# Patient Record
Sex: Female | Born: 1950
Health system: Southern US, Community
[De-identification: ages and names within clinical notes are randomized; demographics above are authoritative.]

## PROBLEM LIST (undated history)

## (undated) DIAGNOSIS — R112 Nausea with vomiting, unspecified: Secondary | ICD-10-CM

## (undated) DIAGNOSIS — E88819 Insulin resistance, unspecified: Secondary | ICD-10-CM

## (undated) DIAGNOSIS — M2550 Pain in unspecified joint: Secondary | ICD-10-CM

## (undated) DIAGNOSIS — Z9889 Other specified postprocedural states: Secondary | ICD-10-CM

## (undated) DIAGNOSIS — C4490 Unspecified malignant neoplasm of skin, unspecified: Secondary | ICD-10-CM

## (undated) DIAGNOSIS — E559 Vitamin D deficiency, unspecified: Secondary | ICD-10-CM

## (undated) DIAGNOSIS — E785 Hyperlipidemia, unspecified: Secondary | ICD-10-CM

## (undated) DIAGNOSIS — I10 Essential (primary) hypertension: Secondary | ICD-10-CM

## (undated) DIAGNOSIS — M545 Low back pain, unspecified: Secondary | ICD-10-CM

## (undated) DIAGNOSIS — S82899A Other fracture of unspecified lower leg, initial encounter for closed fracture: Secondary | ICD-10-CM

## (undated) DIAGNOSIS — M1990 Unspecified osteoarthritis, unspecified site: Secondary | ICD-10-CM

## (undated) DIAGNOSIS — R0602 Shortness of breath: Secondary | ICD-10-CM

## (undated) DIAGNOSIS — R600 Localized edema: Secondary | ICD-10-CM

## (undated) DIAGNOSIS — E669 Obesity, unspecified: Secondary | ICD-10-CM

## (undated) DIAGNOSIS — J189 Pneumonia, unspecified organism: Secondary | ICD-10-CM

## (undated) DIAGNOSIS — E538 Deficiency of other specified B group vitamins: Secondary | ICD-10-CM

## (undated) DIAGNOSIS — R002 Palpitations: Secondary | ICD-10-CM

## (undated) DIAGNOSIS — B019 Varicella without complication: Secondary | ICD-10-CM

## (undated) DIAGNOSIS — J45909 Unspecified asthma, uncomplicated: Secondary | ICD-10-CM

## (undated) DIAGNOSIS — D649 Anemia, unspecified: Secondary | ICD-10-CM

## (undated) DIAGNOSIS — E8881 Metabolic syndrome: Secondary | ICD-10-CM

## (undated) DIAGNOSIS — M79604 Pain in right leg: Secondary | ICD-10-CM

## (undated) DIAGNOSIS — G2581 Restless legs syndrome: Secondary | ICD-10-CM

## (undated) DIAGNOSIS — E039 Hypothyroidism, unspecified: Secondary | ICD-10-CM

## (undated) DIAGNOSIS — G4700 Insomnia, unspecified: Secondary | ICD-10-CM

## (undated) DIAGNOSIS — R9439 Abnormal result of other cardiovascular function study: Secondary | ICD-10-CM

## (undated) DIAGNOSIS — M4800 Spinal stenosis, site unspecified: Secondary | ICD-10-CM

## (undated) DIAGNOSIS — K219 Gastro-esophageal reflux disease without esophagitis: Secondary | ICD-10-CM

## (undated) DIAGNOSIS — M069 Rheumatoid arthritis, unspecified: Secondary | ICD-10-CM

## (undated) DIAGNOSIS — I4891 Unspecified atrial fibrillation: Secondary | ICD-10-CM

## (undated) DIAGNOSIS — R040 Epistaxis: Secondary | ICD-10-CM

## (undated) DIAGNOSIS — G4730 Sleep apnea, unspecified: Secondary | ICD-10-CM

## (undated) DIAGNOSIS — M549 Dorsalgia, unspecified: Secondary | ICD-10-CM

## (undated) HISTORY — PX: INGUINAL HERNIA REPAIR: SUR1180

## (undated) HISTORY — DX: Varicella without complication: B01.9

## (undated) HISTORY — DX: Palpitations: R00.2

## (undated) HISTORY — DX: Low back pain: M54.5

## (undated) HISTORY — DX: Shortness of breath: R06.02

## (undated) HISTORY — DX: Deficiency of other specified B group vitamins: E53.8

## (undated) HISTORY — DX: Pain in unspecified joint: M25.50

## (undated) HISTORY — DX: Unspecified osteoarthritis, unspecified site: M19.90

## (undated) HISTORY — DX: Unspecified malignant neoplasm of skin, unspecified: C44.90

## (undated) HISTORY — DX: Epistaxis: R04.0

## (undated) HISTORY — DX: Rheumatoid arthritis, unspecified: M06.9

## (undated) HISTORY — DX: Sleep apnea, unspecified: G47.30

## (undated) HISTORY — DX: Low back pain, unspecified: M54.50

## (undated) HISTORY — DX: Restless legs syndrome: G25.81

## (undated) HISTORY — PX: TONSILLECTOMY: SUR1361

## (undated) HISTORY — DX: Spinal stenosis, site unspecified: M48.00

## (undated) HISTORY — DX: Localized edema: R60.0

## (undated) HISTORY — DX: Vitamin D deficiency, unspecified: E55.9

## (undated) HISTORY — PX: CATARACT EXTRACTION: SUR2

## (undated) HISTORY — DX: Unspecified atrial fibrillation: I48.91

## (undated) HISTORY — DX: Insomnia, unspecified: G47.00

## (undated) HISTORY — DX: Dorsalgia, unspecified: M54.9

## (undated) HISTORY — DX: Unspecified asthma, uncomplicated: J45.909

## (undated) HISTORY — DX: Pain in right leg: M79.604

## (undated) HISTORY — DX: Anemia, unspecified: D64.9

## (undated) HISTORY — DX: Essential (primary) hypertension: I10

## (undated) HISTORY — PX: THYROIDECTOMY: SHX17

---

## 1999-09-28 ENCOUNTER — Ambulatory Visit (HOSPITAL_COMMUNITY): Admission: RE | Admit: 1999-09-28 | Discharge: 1999-09-28 | Payer: Self-pay | Admitting: Internal Medicine

## 1999-09-28 ENCOUNTER — Encounter: Payer: Self-pay | Admitting: Internal Medicine

## 1999-10-18 ENCOUNTER — Other Ambulatory Visit: Admission: RE | Admit: 1999-10-18 | Discharge: 1999-10-18 | Payer: Self-pay | Admitting: Internal Medicine

## 2000-11-29 ENCOUNTER — Other Ambulatory Visit: Admission: RE | Admit: 2000-11-29 | Discharge: 2000-11-29 | Payer: Self-pay | Admitting: Internal Medicine

## 2002-10-17 ENCOUNTER — Other Ambulatory Visit: Admission: RE | Admit: 2002-10-17 | Discharge: 2002-10-17 | Payer: Self-pay | Admitting: Internal Medicine

## 2003-06-26 ENCOUNTER — Ambulatory Visit (HOSPITAL_COMMUNITY): Admission: RE | Admit: 2003-06-26 | Discharge: 2003-06-26 | Payer: Self-pay | Admitting: Internal Medicine

## 2004-01-15 ENCOUNTER — Encounter: Admission: RE | Admit: 2004-01-15 | Discharge: 2004-01-15 | Payer: Self-pay | Admitting: Orthopedic Surgery

## 2004-04-01 ENCOUNTER — Other Ambulatory Visit: Admission: RE | Admit: 2004-04-01 | Discharge: 2004-04-01 | Payer: Self-pay | Admitting: Internal Medicine

## 2004-07-24 HISTORY — PX: TOTAL HIP ARTHROPLASTY: SHX124

## 2005-07-24 LAB — HM COLONOSCOPY

## 2009-07-24 LAB — HM PAP SMEAR

## 2011-05-29 ENCOUNTER — Other Ambulatory Visit (HOSPITAL_COMMUNITY): Payer: Self-pay | Admitting: *Deleted

## 2011-05-29 DIAGNOSIS — Z1231 Encounter for screening mammogram for malignant neoplasm of breast: Secondary | ICD-10-CM

## 2011-06-23 ENCOUNTER — Ambulatory Visit (HOSPITAL_COMMUNITY)
Admission: RE | Admit: 2011-06-23 | Discharge: 2011-06-23 | Disposition: A | Discharge status: Home / Self Care | Payer: PRIVATE HEALTH INSURANCE | Source: Ambulatory Visit | Attending: Family Medicine | Admitting: Family Medicine

## 2011-06-23 DIAGNOSIS — Z1231 Encounter for screening mammogram for malignant neoplasm of breast: Secondary | ICD-10-CM | POA: Insufficient documentation

## 2011-07-16 ENCOUNTER — Inpatient Hospital Stay (HOSPITAL_COMMUNITY)
Admission: EM | Admit: 2011-07-16 | Discharge: 2011-07-21 | DRG: 193 | Disposition: A | Discharge status: Home / Self Care | Payer: PRIVATE HEALTH INSURANCE | Attending: Internal Medicine | Admitting: Internal Medicine

## 2011-07-16 ENCOUNTER — Encounter: Payer: Self-pay | Admitting: *Deleted

## 2011-07-16 ENCOUNTER — Emergency Department (HOSPITAL_COMMUNITY): Payer: PRIVATE HEALTH INSURANCE

## 2011-07-16 ENCOUNTER — Other Ambulatory Visit: Payer: Self-pay

## 2011-07-16 DIAGNOSIS — R05 Cough: Secondary | ICD-10-CM | POA: Diagnosis present

## 2011-07-16 DIAGNOSIS — R0600 Dyspnea, unspecified: Secondary | ICD-10-CM

## 2011-07-16 DIAGNOSIS — E785 Hyperlipidemia, unspecified: Secondary | ICD-10-CM

## 2011-07-16 DIAGNOSIS — D72829 Elevated white blood cell count, unspecified: Secondary | ICD-10-CM | POA: Diagnosis present

## 2011-07-16 DIAGNOSIS — Z8701 Personal history of pneumonia (recurrent): Secondary | ICD-10-CM

## 2011-07-16 DIAGNOSIS — Z6841 Body Mass Index (BMI) 40.0 and over, adult: Secondary | ICD-10-CM

## 2011-07-16 DIAGNOSIS — E876 Hypokalemia: Secondary | ICD-10-CM | POA: Diagnosis not present

## 2011-07-16 DIAGNOSIS — E669 Obesity, unspecified: Secondary | ICD-10-CM

## 2011-07-16 DIAGNOSIS — D649 Anemia, unspecified: Secondary | ICD-10-CM | POA: Diagnosis present

## 2011-07-16 DIAGNOSIS — R0902 Hypoxemia: Secondary | ICD-10-CM

## 2011-07-16 DIAGNOSIS — K219 Gastro-esophageal reflux disease without esophagitis: Secondary | ICD-10-CM | POA: Diagnosis present

## 2011-07-16 DIAGNOSIS — I10 Essential (primary) hypertension: Secondary | ICD-10-CM

## 2011-07-16 DIAGNOSIS — J9600 Acute respiratory failure, unspecified whether with hypoxia or hypercapnia: Secondary | ICD-10-CM | POA: Diagnosis present

## 2011-07-16 DIAGNOSIS — J189 Pneumonia, unspecified organism: Secondary | ICD-10-CM | POA: Diagnosis present

## 2011-07-16 DIAGNOSIS — R059 Cough, unspecified: Secondary | ICD-10-CM | POA: Diagnosis present

## 2011-07-16 DIAGNOSIS — E039 Hypothyroidism, unspecified: Secondary | ICD-10-CM

## 2011-07-16 HISTORY — DX: Gastro-esophageal reflux disease without esophagitis: K21.9

## 2011-07-16 HISTORY — DX: Hyperlipidemia, unspecified: E78.5

## 2011-07-16 HISTORY — DX: Obesity, unspecified: E66.9

## 2011-07-16 HISTORY — DX: Pneumonia, unspecified organism: J18.9

## 2011-07-16 HISTORY — DX: Unspecified osteoarthritis, unspecified site: M19.90

## 2011-07-16 HISTORY — DX: Hypothyroidism, unspecified: E03.9

## 2011-07-16 HISTORY — DX: Essential (primary) hypertension: I10

## 2011-07-16 LAB — CK TOTAL AND CKMB (NOT AT ARMC)
Relative Index: 2.1 (ref 0.0–2.5)
Total CK: 149 U/L (ref 7–177)

## 2011-07-16 LAB — DIFFERENTIAL
Basophils Relative: 0 % (ref 0–1)
Eosinophils Absolute: 1 10*3/uL — ABNORMAL HIGH (ref 0.0–0.7)
Eosinophils Relative: 5 % (ref 0–5)
Neutrophils Relative %: 78 % — ABNORMAL HIGH (ref 43–77)

## 2011-07-16 LAB — COMPREHENSIVE METABOLIC PANEL
ALT: 18 U/L (ref 0–35)
Albumin: 3.5 g/dL (ref 3.5–5.2)
Alkaline Phosphatase: 110 U/L (ref 39–117)
BUN: 17 mg/dL (ref 6–23)
Calcium: 9.9 mg/dL (ref 8.4–10.5)
GFR calc Af Amer: 79 mL/min — ABNORMAL LOW (ref >90)
Potassium: 4.1 mEq/L (ref 3.5–5.1)
Sodium: 141 mEq/L (ref 135–145)
Total Protein: 7.3 g/dL (ref 6.0–8.3)

## 2011-07-16 LAB — TROPONIN I: Troponin I: <0.3 ng/mL (ref <0.30)

## 2011-07-16 LAB — PRO B NATRIURETIC PEPTIDE: Pro B Natriuretic peptide (BNP): 164.5 pg/mL — ABNORMAL HIGH (ref 0–125)

## 2011-07-16 LAB — CBC
MCH: 30 pg (ref 26.0–34.0)
MCHC: 32.4 g/dL (ref 30.0–36.0)
MCV: 92.4 fL (ref 78.0–100.0)
Platelets: 374 10*3/uL (ref 150–400)

## 2011-07-16 MED ORDER — IOHEXOL 300 MG/ML  SOLN
100.0000 mL | Freq: Once | INTRAMUSCULAR | Status: AC | PRN
  Administered 2011-07-16: 100 mL via INTRAVENOUS

## 2011-07-16 MED ORDER — SODIUM CHLORIDE 0.9 % IV SOLN
INTRAVENOUS | Status: DC
  Administered 2011-07-17: 01:00:00 via INTRAVENOUS

## 2011-07-16 MED ORDER — ALBUTEROL SULFATE (5 MG/ML) 0.5% IN NEBU
2.5000 mg | INHALATION_SOLUTION | RESPIRATORY_TRACT | Status: DC
  Administered 2011-07-17: 2.5 mg via RESPIRATORY_TRACT
  Filled 2011-07-16: qty 0.5

## 2011-07-16 NOTE — ED Notes (Signed)
Patient transported to X-ray 

## 2011-07-16 NOTE — ED Provider Notes (Signed)
History     CSN: [id number]  Arrival date & time 07/16/11  0865   First MD Initiated Contact with Patient 07/16/11 2033      Chief Complaint  Patient presents with  . Shortness of Breath    (Consider location/radiation/quality/duration/timing/severity/associated sxs/prior treatment) HPI Comments: Patient presents complaining of a two month history of persistent cough, shortness of breath.  She has been seen by multiple physicians at other facilities and has been prescribed antibiotics more than once.  She is now having more dyspnea with exertion, still coughing.  Cough is sometimes productive, sometimes not.    Patient is a 60 y.o. female presenting with shortness of breath. The history is provided by the patient and the spouse.  Shortness of Breath  Episode onset: 2 months ago. The problem occurs continuously. The problem has been gradually worsening. The problem is moderate. The symptoms are relieved by nothing. The symptoms are aggravated by nothing. Associated symptoms include cough and shortness of breath. Pertinent negatives include no chest pain, no chest pressure, no fever and no rhinorrhea.    Past Medical History  Diagnosis Date  . Pneumonia     this October from which she has said inhailer  . Obesity   . Arthritis   . Thyroid disease   . Hypertension   . GERD (gastroesophageal reflux disease)   . Hyperlipidemia     Past Surgical History  Procedure Date  . Joint replacement     right hip replacement  . Thyroidectomy   . Cesarean section   . Hernia repair   . Tonsillectomy     Family History  Problem Relation Age of Onset  . Cancer Mother   . Osteoarthritis Father   . Hyperlipidemia Father   . Hyperlipidemia Sister   . Migraines Sister     History  Substance Use Topics  . Smoking status: Not on file  . Smokeless tobacco: Not on file  . Alcohol Use: Not on file    OB History    Grav Para Term Preterm Abortions TAB SAB Ect Mult Living        Review of Systems  Constitutional: Negative for fever.  HENT: Negative for rhinorrhea.   Respiratory: Positive for cough and shortness of breath.   Cardiovascular: Negative for chest pain.  All other systems reviewed and are negative.    Allergies  Review of patient's allergies indicates no known allergies.  Home Medications   Current Outpatient Rx  Name Route Sig Dispense Refill  . ACETAMINOPHEN 325 MG PO TABS Oral Take 650 mg by mouth every 6 (six) hours as needed. As needed for pain     . ALBUTEROL SULFATE (2.5 MG/3ML) 0.083% IN NEBU Nebulization Take 2.5 mg by nebulization every 6 (six) hours as needed. For breathing    . DM-GUAIFENESIN ER 30-600 MG PO TB12 Oral Take 1 tablet by mouth every 12 (twelve) hours.      Marland Kitchen DICLOFENAC-MISOPROSTOL 75-200 MG-MCG PO TABS Oral Take 1 tablet by mouth 2 (two) times daily.      Marland Kitchen LEVOTHYROXINE SODIUM 100 MCG PO TABS Oral Take 100 mcg by mouth daily.      Marland Kitchen LORATADINE 10 MG PO TABS Oral Take 10 mg by mouth daily.      Marland Kitchen PANTOPRAZOLE SODIUM 20 MG PO TBEC Oral Take 20 mg by mouth daily.      . QUINAPRIL HCL 10 MG PO TABS Oral Take 10 mg by mouth at bedtime.      Marland Kitchen  SIMVASTATIN 10 MG PO TABS Oral Take 10 mg by mouth at bedtime.      Marland Kitchen VITAMIN D (ERGOCALCIFEROL) 50000 UNITS PO CAPS Oral Take 50,000 Units by mouth.        BP 167/91  Pulse 136  Temp(Src) 98.3 F (36.8 C) (Oral)  Resp 26  SpO2 100%  Physical Exam  Nursing note and vitals reviewed. Constitutional: She is oriented to person, place, and time. She appears well-developed and well-nourished. No distress.  HENT:  Head: Normocephalic and atraumatic.  Neck: Normal range of motion. Neck supple.  Cardiovascular: Normal rate and regular rhythm.  Exam reveals no gallop and no friction rub.   No murmur heard. Pulmonary/Chest: Effort normal and breath sounds normal. No respiratory distress.       Slight rhonchi are present bilaterally with expiration.  Abdominal: Soft. Bowel  sounds are normal. She exhibits no distension. There is no tenderness.  Musculoskeletal: Normal range of motion.  Neurological: She is alert and oriented to person, place, and time.  Skin: Skin is warm and dry. She is not diaphoretic.    ED Course  Procedures (including critical care time)   Labs Reviewed  CBC  DIFFERENTIAL  COMPREHENSIVE METABOLIC PANEL  CK TOTAL AND CKMB  TROPONIN I  PRO B NATRIURETIC PEPTIDE   No results found.   No diagnosis found.   Date: 07/16/2011  Rate: 109  Rhythm: normal sinus rhythm  QRS Axis: left  Intervals: normal  ST/T Wave abnormalities: normal  Conduction Disutrbances:none  Narrative Interpretation:   Old EKG Reviewed: none available    MDM  The Chest ct shows persistent infiltrate in the left base.  She has a wbc of 20k, O2 sats of 90%, and becomes dyspneic with minimal exertion.  Will consult medicine for admission.      Geoffery Lyons, MD 07/16/11 [phone]

## 2011-07-16 NOTE — ED Notes (Signed)
Pt states that she was asleep last night when she felt as if she could not breath well so she used her home nebulizer which seemed to help.  Pt presents tonight with shortness of breath and O2 saturations in 90's that is helped immediately with 3L/[place] at which point her saturations rose to 100%.

## 2011-07-17 ENCOUNTER — Encounter (HOSPITAL_COMMUNITY): Payer: Self-pay | Admitting: Family Medicine

## 2011-07-17 DIAGNOSIS — I10 Essential (primary) hypertension: Secondary | ICD-10-CM

## 2011-07-17 DIAGNOSIS — M79609 Pain in unspecified limb: Secondary | ICD-10-CM

## 2011-07-17 DIAGNOSIS — E039 Hypothyroidism, unspecified: Secondary | ICD-10-CM

## 2011-07-17 DIAGNOSIS — J189 Pneumonia, unspecified organism: Secondary | ICD-10-CM | POA: Diagnosis present

## 2011-07-17 DIAGNOSIS — E669 Obesity, unspecified: Secondary | ICD-10-CM

## 2011-07-17 DIAGNOSIS — E785 Hyperlipidemia, unspecified: Secondary | ICD-10-CM

## 2011-07-17 HISTORY — DX: Essential (primary) hypertension: I10

## 2011-07-17 LAB — CBC
HCT: 37.4 % (ref 36.0–46.0)
Hemoglobin: 12.3 g/dL (ref 12.0–15.0)
MCH: 30.2 pg (ref 26.0–34.0)
MCHC: 32.9 g/dL (ref 30.0–36.0)
MCV: 91.9 fL (ref 78.0–100.0)
RBC: 4.07 MIL/uL (ref 3.87–5.11)

## 2011-07-17 LAB — BASIC METABOLIC PANEL
BUN: 13 mg/dL (ref 6–23)
CO2: 28 mEq/L (ref 19–32)
Glucose, Bld: 117 mg/dL — ABNORMAL HIGH (ref 70–99)
Potassium: 3.4 mEq/L — ABNORMAL LOW (ref 3.5–5.1)
Sodium: 138 mEq/L (ref 135–145)

## 2011-07-17 MED ORDER — SODIUM CHLORIDE 0.9 % IN NEBU
INHALATION_SOLUTION | RESPIRATORY_TRACT | Status: AC
  Filled 2011-07-17: qty 3

## 2011-07-17 MED ORDER — ONDANSETRON HCL 4 MG PO TABS: 4.0000 mg | ORAL_TABLET | Freq: Four times a day (QID) | ORAL | Status: DC | PRN

## 2011-07-17 MED ORDER — ONDANSETRON HCL 4 MG/2ML IJ SOLN: 4.0000 mg | Freq: Four times a day (QID) | INTRAMUSCULAR | Status: DC | PRN

## 2011-07-17 MED ORDER — PNEUMOCOCCAL VAC POLYVALENT 25 MCG/0.5ML IJ INJ
0.5000 mL | INJECTION | INTRAMUSCULAR | Status: AC
  Administered 2011-07-18: 0.5 mL via INTRAMUSCULAR
  Filled 2011-07-17 (×2): qty 0.5

## 2011-07-17 MED ORDER — ENOXAPARIN SODIUM 80 MG/0.8ML ~~LOC~~ SOLN
70.0000 mg | SUBCUTANEOUS | Status: DC
  Administered 2011-07-17: 70 mg via SUBCUTANEOUS
  Filled 2011-07-17: qty 0.8

## 2011-07-17 MED ORDER — ALBUTEROL SULFATE (5 MG/ML) 0.5% IN NEBU
2.5000 mg | INHALATION_SOLUTION | RESPIRATORY_TRACT | Status: DC | PRN
  Administered 2011-07-18 – 2011-07-19 (×3): 2.5 mg via RESPIRATORY_TRACT
  Filled 2011-07-17 (×3): qty 0.5

## 2011-07-17 MED ORDER — LISINOPRIL 10 MG PO TABS
10.0000 mg | ORAL_TABLET | Freq: Every day | ORAL | Status: DC
  Administered 2011-07-17 – 2011-07-19 (×3): 10 mg via ORAL
  Filled 2011-07-17 (×3): qty 1

## 2011-07-17 MED ORDER — IPRATROPIUM BROMIDE 0.02 % IN SOLN
0.5000 mg | RESPIRATORY_TRACT | Status: DC | PRN
  Administered 2011-07-18 – 2011-07-19 (×3): 0.5 mg via RESPIRATORY_TRACT
  Filled 2011-07-17 (×3): qty 2.5

## 2011-07-17 MED ORDER — LORAZEPAM 0.5 MG PO TABS
0.5000 mg | ORAL_TABLET | Freq: Once | ORAL | Status: AC
  Administered 2011-07-18: 0.5 mg via ORAL

## 2011-07-17 MED ORDER — PANTOPRAZOLE SODIUM 20 MG PO TBEC
20.0000 mg | DELAYED_RELEASE_TABLET | Freq: Every day | ORAL | Status: DC
  Administered 2011-07-17 – 2011-07-19 (×3): 20 mg via ORAL
  Filled 2011-07-17 (×3): qty 1

## 2011-07-17 MED ORDER — BIOTENE DRY MOUTH MT LIQD
15.0000 mL | Freq: Two times a day (BID) | OROMUCOSAL | Status: DC
  Administered 2011-07-17 – 2011-07-21 (×9): 15 mL via OROMUCOSAL

## 2011-07-17 MED ORDER — VANCOMYCIN HCL 1000 MG IV SOLR
1250.0000 mg | Freq: Two times a day (BID) | INTRAVENOUS | Status: DC
  Administered 2011-07-17 – 2011-07-19 (×7): 1250 mg via INTRAVENOUS
  Filled 2011-07-17 (×7): qty 1250

## 2011-07-17 MED ORDER — LORATADINE 10 MG PO TABS
10.0000 mg | ORAL_TABLET | Freq: Every day | ORAL | Status: DC
  Administered 2011-07-17 – 2011-07-21 (×5): 10 mg via ORAL
  Filled 2011-07-17 (×6): qty 1

## 2011-07-17 MED ORDER — PIPERACILLIN-TAZOBACTAM 3.375 G IVPB
3.3750 g | Freq: Three times a day (TID) | INTRAVENOUS | Status: DC
  Administered 2011-07-17 – 2011-07-20 (×12): 3.375 g via INTRAVENOUS
  Filled 2011-07-17 (×13): qty 50

## 2011-07-17 MED ORDER — ENOXAPARIN SODIUM 40 MG/0.4ML ~~LOC~~ SOLN
70.0000 mg | SUBCUTANEOUS | Status: DC
  Administered 2011-07-18: 70 mg via SUBCUTANEOUS
  Filled 2011-07-17 (×2): qty 0.3

## 2011-07-17 MED ORDER — SODIUM CHLORIDE 0.9 % IJ SOLN
3.0000 mL | INTRAMUSCULAR | Status: DC | PRN
  Administered 2011-07-21: 3 mL via INTRAVENOUS

## 2011-07-17 MED ORDER — ACETAMINOPHEN 325 MG PO TABS
650.0000 mg | ORAL_TABLET | Freq: Four times a day (QID) | ORAL | Status: DC | PRN
  Administered 2011-07-17 – 2011-07-20 (×3): 650 mg via ORAL
  Filled 2011-07-17 (×3): qty 2

## 2011-07-17 MED ORDER — DM-GUAIFENESIN ER 30-600 MG PO TB12
1.0000 | ORAL_TABLET | Freq: Two times a day (BID) | ORAL | Status: DC
  Administered 2011-07-17 – 2011-07-19 (×6): 1 via ORAL
  Filled 2011-07-17 (×9): qty 1

## 2011-07-17 MED ORDER — ALUM & MAG HYDROXIDE-SIMETH 200-200-20 MG/5ML PO SUSP: 30.0000 mL | Freq: Four times a day (QID) | ORAL | Status: DC | PRN

## 2011-07-17 MED ORDER — ENOXAPARIN SODIUM 40 MG/0.4ML ~~LOC~~ SOLN
140.0000 mg | Freq: Two times a day (BID) | SUBCUTANEOUS | Status: DC
  Filled 2011-07-17 (×3): qty 0.4

## 2011-07-17 MED ORDER — VITAMINS A & D EX OINT
TOPICAL_OINTMENT | CUTANEOUS | Status: AC
  Administered 2011-07-17: 06:00:00
  Filled 2011-07-17: qty 5

## 2011-07-17 MED ORDER — METHYLPREDNISOLONE SODIUM SUCC 40 MG IJ SOLR
40.0000 mg | Freq: Two times a day (BID) | INTRAMUSCULAR | Status: DC
  Administered 2011-07-17 – 2011-07-18 (×3): 40 mg via INTRAVENOUS
  Filled 2011-07-17 (×5): qty 1

## 2011-07-17 MED ORDER — LORAZEPAM 0.5 MG PO TABS
0.5000 mg | ORAL_TABLET | Freq: Every evening | ORAL | Status: DC | PRN
  Administered 2011-07-17 – 2011-07-18 (×2): 0.5 mg via ORAL
  Filled 2011-07-17 (×3): qty 1

## 2011-07-17 MED ORDER — LEVOTHYROXINE SODIUM 100 MCG PO TABS
100.0000 ug | ORAL_TABLET | Freq: Every day | ORAL | Status: DC
  Administered 2011-07-17 – 2011-07-21 (×5): 100 ug via ORAL
  Filled 2011-07-17 (×6): qty 1

## 2011-07-17 MED ORDER — DICLOFENAC SODIUM 75 MG PO TBEC
75.0000 mg | DELAYED_RELEASE_TABLET | Freq: Two times a day (BID) | ORAL | Status: DC
  Administered 2011-07-18 – 2011-07-21 (×8): 75 mg via ORAL
  Filled 2011-07-17 (×11): qty 1

## 2011-07-17 MED ORDER — SIMVASTATIN 10 MG PO TABS
10.0000 mg | ORAL_TABLET | Freq: Every day | ORAL | Status: DC
  Administered 2011-07-17 – 2011-07-20 (×4): 10 mg via ORAL
  Filled 2011-07-17 (×6): qty 1

## 2011-07-17 MED ORDER — BENZONATATE 100 MG PO CAPS
100.0000 mg | ORAL_CAPSULE | Freq: Three times a day (TID) | ORAL | Status: DC | PRN
  Administered 2011-07-17 – 2011-07-18 (×3): 100 mg via ORAL
  Filled 2011-07-17 (×4): qty 1

## 2011-07-17 MED ORDER — PNEUMOCOCCAL 13-VAL CONJ VACC IM SUSP
0.5000 mL | INTRAMUSCULAR | Status: DC
  Filled 2011-07-17: qty 0.5

## 2011-07-17 NOTE — Progress Notes (Signed)
ANTIBIOTIC CONSULT NOTE - INITIAL  Pharmacy Consult for Vancomycin & Zosyn Indication: Pneumonia  No Known Allergies  Patient Measurements: Weight = 136.1 kg (300 pounds - pt stated weight) Height = 63 inches   Vital Signs: Temp: 99.4 F (37.4 C) (12/23 2127) Temp src: Oral (12/23 2127) BP: 155/71 mmHg (12/23 2127) Pulse Rate: 110  (12/23 2127) Intake/Output from previous day:   Intake/Output from this shift:    Labs:  [facility] 07/16/11 2115  WBC 20.0*  HGB 13.0  PLT 374  LABCREA --  CREATININE 0.89   CrCl is unknown because there is no height on file for the current visit. No results found for this basename: VANCOTROUGH:2,VANCOPEAK:2,VANCORANDOM:2,GENTTROUGH:2,GENTPEAK:2,GENTRANDOM:2,TOBRATROUGH:2,TOBRAPEAK:2,TOBRARND:2,AMIKACINPEAK:2,AMIKACINTROU:2,AMIKACIN:2, in the last 72 hours   Microbiology: No results found for this or any previous visit (from the past 720 hour(s)).  Medical History: Past Medical History  Diagnosis Date  . Pneumonia     this October from which she has said inhailer  . Obesity   . Arthritis   . Hypothyroidism   . Hypertension   . GERD (gastroesophageal reflux disease)   . Hyperlipidemia     Medications:  Scheduled:    . sodium chloride   Intravenous STAT  . albuterol  2.5 mg Nebulization Q4H  . dextromethorphan-guaiFENesin  1 tablet Oral Q12H  . methylPREDNISolone (SOLU-MEDROL) injection  40 mg Intravenous Q12H  . sodium chloride       Infusions:   Assessment: 60 year old female with chief complaint of shortness of breath.  Pt has been treated as an outpatient with a recent pneumonia.  Chest Xray done 07/16/11 states concern for pneumonia.  IV Vancomycin and Zosyn to begin.  CrCl (n) = 75 ml/min  Goal of Therapy:  Vancomycin trough level 15-20 mcg/ml  Plan:  1.  Zosyn 3.375gm IV q8h (each dose infused over 4 hours) 2.  Vancomycin 1250mg  IV q12h 3.  Check vancomycin trough level as needed   Rebecca Brooks 07/17/2011,2:08 AM

## 2011-07-17 NOTE — Progress Notes (Signed)
I reviewed the chart, saw and examined patient at bedside. Patient has pain left leg, which is also swollen. NO dvt. Will continue orders per Dr Joneen Roach. S.Robertta Halfhill, MD. [phone]

## 2011-07-17 NOTE — Progress Notes (Signed)
UR completed 

## 2011-07-17 NOTE — H&P (Signed)
PCP:   Hardie Pulley, DO   Chief Complaint:  Shortness of breath  HPI: This is a 60 year old female who states she's been battling pneumonia since October 30. Her symptoms are shortness of breath, wheezing and coughing. She went to urgent care on October 30, the chest x-ray done diagnosed pneumonia. She received Rocephin and steroid shots and and was given a prescription for clarithromycin. She completed the course, did not get better. She returned to the urgent care clinic was treated for viral syndrome. Her cough continued, she saw her PCP on December 5,as she was unable to breathe. Her coughing was severe. She was again treated with a course of Levaquin and steroids. She later got the flu shot. Followup chest x-ray was clear. Today again she developed severe cough nonproductive and wheezing. She reports no fevers, no nausea or vomiting or diarrhea. She came to the ER. History obtained from patient and her husband is at bedside.    Review of Systems:  positives bolded  The patient denies anorexia, fever, weight loss,, vision loss, decreased hearing, hoarseness, chest pain, syncope, dyspnea on exertion, peripheral edema, balance deficits, hemoptysis, abdominal pain, melena, hematochezia, severe indigestion/heartburn, hematuria, incontinence, genital sores, muscle weakness, suspicious skin lesions, transient blindness, difficulty walking, depression, unusual weight change, abnormal bleeding, enlarged lymph nodes, angioedema, and breast masses.  Past Medical History: Past Medical History  Diagnosis Date  . Pneumonia     this October from which she has said inhailer  . Obesity   . Arthritis   . Hypothyroidism   . Hypertension   . GERD (gastroesophageal reflux disease)   . Hyperlipidemia    Past Surgical History  Procedure Date  . Joint replacement     right hip replacement  . Thyroidectomy   . Cesarean section   . Hernia repair   . Tonsillectomy     Medications: Prior to Admission  medications   Medication Sig Start Date End Date Taking? Authorizing Provider  acetaminophen (TYLENOL) 325 MG tablet Take 650 mg by mouth every 6 (six) hours as needed. As needed for pain    Yes Historical Provider, MD  albuterol (PROVENTIL) (2.5 MG/3ML) 0.083% nebulizer solution Take 2.5 mg by nebulization every 6 (six) hours as needed. For breathing   Yes Historical Provider, MD  dextromethorphan-guaiFENesin (MUCINEX DM) 30-600 MG per 12 hr tablet Take 1 tablet by mouth every 12 (twelve) hours.     Yes Historical Provider, MD  diclofenac-misoprostol (ARTHROTEC 75) 75-200 MG-MCG per tablet Take 1 tablet by mouth 2 (two) times daily.     Yes Historical Provider, MD  levothyroxine (SYNTHROID, LEVOTHROID) 100 MCG tablet Take 100 mcg by mouth daily.     Yes Historical Provider, MD  loratadine (CLARITIN) 10 MG tablet Take 10 mg by mouth daily.     Yes Historical Provider, MD  pantoprazole (PROTONIX) 20 MG tablet Take 20 mg by mouth daily.     Yes Historical Provider, MD  quinapril (ACCUPRIL) 10 MG tablet Take 10 mg by mouth at bedtime.     Yes Historical Provider, MD  simvastatin (ZOCOR) 10 MG tablet Take 10 mg by mouth at bedtime.     Yes Historical Provider, MD  Vitamin D, Ergocalciferol, (DRISDOL) 50000 UNITS CAPS Take 50,000 Units by mouth.     Yes Historical Provider, MD    Allergies:  No Known Allergies  Social History:  reports that she has never smoked. She does not have any smokeless tobacco history on file. She reports that she does  not drink alcohol or use illicit drugs.  Family History: Family History  Problem Relation Age of Onset  . Cancer Mother   . Osteoarthritis Father   . Hyperlipidemia Father   . Hyperlipidemia Sister   . Migraines Sister     Physical Exam: Filed Vitals:   07/16/11 1947 07/16/11 1948 07/16/11 2127 07/17/11 0007  BP: 167/91  155/71   Pulse: 136  110   Temp: 98.3 F (36.8 C)  99.4 F (37.4 C)   TempSrc: Oral  Oral   Resp: 26  20   SpO2: 90% 100%  97% 95%    General:  Alert and oriented times three, well developed and nourished, no acute distress Eyes: PERRLA, pink conjunctiva, no scleral icterus ENT: Moist oral mucosa, neck supple, no thyromegaly Lungs: Course no crackles, no use of accessory muscles Cardiovascular: regular rate and rhythm, no regurgitation, no gallops, no murmurs. No carotid bruits, no JVD Abdomen: soft, positive BS, non-tender, non-distended, no organomegaly, not an acute abdomen GU: not examined Neuro: CN II - XII grossly intact, sensation intact Musculoskeletal: strength 5/5 all extremities, no clubbing, cyanosis or edema Skin: no rash, no subcutaneous crepitation, no decubitus Psych: appropriate patient   Labs on Admission:   [facility] 07/16/11 2115  NA 141  K 4.1  CL 102  CO2 31  GLUCOSE 135*  BUN 17  CREATININE 0.89  CALCIUM 9.9  MG --  PHOS --    Basename 07/16/11 2115  AST 17  ALT 18  ALKPHOS 110  BILITOT 0.4  PROT 7.3  ALBUMIN 3.5   No results found for this basename: LIPASE:2,AMYLASE:2 in the last 72 hours  Basename 07/16/11 2115  WBC 20.0*  NEUTROABS 15.6*  HGB 13.0  HCT 40.1  MCV 92.4  PLT 374    Basename 07/16/11 2106  CKTOTAL 149  CKMB 3.2  CKMBINDEX --  TROPONINI <0.30   No results found for this basename: TSH,T4TOTAL,FREET3,T3FREE,THYROIDAB in the last 72 hours No results found for this basename: VITAMINB12:2,FOLATE:2,FERRITIN:2,TIBC:2,IRON:2,RETICCTPCT:2 in the last 72 hours  Radiological Exams on Admission: Ct Angio Chest W/cm &/or Wo Cm  07/16/2011  *RADIOLOGY REPORT*  Clinical Data:  Persistent cough, shortness of breath and wheezing.  CT ANGIOGRAPHY CHEST WITH CONTRAST  Technique:  Multidetector CT imaging of the chest was performed using the standard protocol during bolus administration of intravenous contrast.  Multiplanar CT image reconstructions including MIPs were obtained to evaluate the vascular anatomy.  Contrast: OMNIPAQUE IOHEXOL 300 MG/ML IV  SOLN  Comparison:  None  Findings:  There is no evidence of significant pulmonary embolus. Evaluation for pulmonary embolus is suboptimal due to limitations in the timing of the contrast bolus.  Focal patchy airspace opacity is noted within the left lower lobe, with two defined foci measuring 1.8 cm and 1.7 cm in size.  This raises concern for pneumonia, though given mild left hilar lymphadenopathy, malignancy could conceivably have a similar appearance.  There is no evidence of pleural effusion or pneumothorax.  No masses are identified; no abnormal focal contrast enhancement is seen.  A mildly prominent left hilar node is noted, measuring 1.1 cm in short axis.  No significant mediastinal lymphadenopathy is seen. The mediastinum is unremarkable in appearance.  No pericardial effusion is identified.  The great vessels are unremarkable in appearance.  No axillary lymphadenopathy is seen.  The thyroid gland is unremarkable in appearance.  The visualized portions of the liver and spleen are unremarkable.  No acute osseous abnormalities are seen.  Marked  subcortical cystic change is noted at the right humeral head, with loss of the joint space, reflecting mild degenerative change.  Review of the MIP images confirms the above findings.  IMPRESSION:  1.  No evidence of significant pulmonary embolus. 2.  Focal patchy airspace opacity within the left lower lobe.  This is concerning for pneumonia, though given mild left hilar lymphadenopathy and the focal appearance of the opacity, malignancy could conceivably have a similar appearance.  If the patient has symptoms for pneumonia, would treat for pneumonia, then perform follow-up chest radiograph to ensure resolution.  Otherwise, additional evaluation would be warranted.  3.  Mildly enlarged left hilar node, measuring 0.1 cm in short axis. 4.  Marked degenerative change at the right humeral head.  Original Report Authenticated By: Tonia Ghent, M.D.     Assessment/Plan Present on Admission:  .Pneumonia Leukocytosis  Admit to MedSurg Continue oxygen and nebulizers Tessalon Perles and sputum cultures  Broad-spectrum antibiotics IV  Solu-Medrol Morbid obesity Hypothyroidism Hypertension Hyperlipidemia GERD Stable resume home medications   Full code  DVT prophylaxis Team 1/Dr. Imogene Burn, Sophonie Goforth 07/17/2011, 12:30 AM

## 2011-07-17 NOTE — Progress Notes (Signed)
ANTICOAGULATION CONSULT NOTE - Initial Consult  Pharmacy Consult for Lovenox Indication: VTE treatment  No Known Allergies  Patient Measurements: Height: 5\' 3"  (160 cm) Weight: 308 lb 14.4 oz (140.116 kg) IBW/kg (Calculated) : 52.4   Vital Signs: Temp: 98.1 F (36.7 C) (12/24 1349) Temp src: Oral (12/24 1349) BP: 154/94 mmHg (12/24 1349) Pulse Rate: 100  (12/24 1349)  Labs:  Basename 07/17/11 0416 07/16/11 2115 07/16/11 2106  HGB 12.3 13.0 --  HCT 37.4 40.1 --  PLT 353 374 --  APTT -- -- --  LABPROT -- -- --  INR -- -- --  HEPARINUNFRC -- -- --  CREATININE 0.80 0.89 --  CKTOTAL -- -- 149  CKMB -- -- 3.2  TROPONINI -- -- <0.30   Estimated Creatinine Clearance: 102 ml/min (by C-G formula based on Cr of 0.8).  Medical History: Past Medical History  Diagnosis Date  . Pneumonia     this October from which she has said inhailer  . Obesity   . Arthritis   . Hypothyroidism   . Hypertension   . GERD (gastroesophageal reflux disease)   . Hyperlipidemia     Medications:  Scheduled:    . antiseptic oral rinse  15 mL Mouth Rinse BID  . dextromethorphan-guaiFENesin  1 tablet Oral BID  . enoxaparin  70 mg Subcutaneous Q24H  . levothyroxine  100 mcg Oral QAC breakfast  . lisinopril  10 mg Oral Daily  . loratadine  10 mg Oral Daily  . methylPREDNISolone (SOLU-MEDROL) injection  40 mg Intravenous Q12H  . pantoprazole  20 mg Oral Daily  . piperacillin-tazobactam (ZOSYN)  IV  3.375 g Intravenous Q8H  . pneumococcal 13-valent conjugate vaccine  0.5 mL Intramuscular Tomorrow-1000  . simvastatin  10 mg Oral QHS  . sodium chloride      . vancomycin  1,250 mg Intravenous Q12H  . vitamin A & D      . DISCONTD: sodium chloride   Intravenous STAT  . DISCONTD: albuterol  2.5 mg Nebulization Q4H    Assessment: Initiate VTE treatment with Lovenox instead of prophylaxis dosing No evidence of significant pulmonary embolus on CT angio chest Lower extremity venous duplex  pending Current weight 140kg Renal function normal with CrCl (n) ~ 98 ml/min  Goal of Therapy:  Therapeutic lovenox dosing   Plan:  Lovenox 140mg  SQ Q12 hours Follow labs for renal function, CBC, VTE testing  Lynann Beaver PharmD  Pager [phone] 07/17/2011 3:01 PM

## 2011-07-17 NOTE — Progress Notes (Signed)
Bilateral lower extremity venous duplex completed.  Preliminary report is negative for DVT or SVT bilaterally.  There appears to be a Baker's cyst in the right popliteal fossa. Smiley Houseman 07/17/2011, 3:16 PM

## 2011-07-17 NOTE — Progress Notes (Signed)
  Echocardiogram 2D Echocardiogram has been performed.  Jamar Weatherall Nira Retort 07/17/2011, 3:51 PM

## 2011-07-18 LAB — COMPREHENSIVE METABOLIC PANEL
ALT: 15 U/L (ref 0–35)
Alkaline Phosphatase: 92 U/L (ref 39–117)
CO2: 29 mEq/L (ref 19–32)
Chloride: 105 mEq/L (ref 96–112)
GFR calc Af Amer: 85 mL/min — ABNORMAL LOW (ref >90)
Glucose, Bld: 145 mg/dL — ABNORMAL HIGH (ref 70–99)
Potassium: 3.7 mEq/L (ref 3.5–5.1)
Sodium: 144 mEq/L (ref 135–145)
Total Bilirubin: 0.3 mg/dL (ref 0.3–1.2)
Total Protein: 6.5 g/dL (ref 6.0–8.3)

## 2011-07-18 LAB — LIPID PANEL
Cholesterol: 192 mg/dL (ref 0–200)
Total CHOL/HDL Ratio: 2.7 RATIO
Triglycerides: 72 mg/dL (ref <150)
VLDL: 14 mg/dL (ref 0–40)

## 2011-07-18 LAB — CBC
Hemoglobin: 11.3 g/dL — ABNORMAL LOW (ref 12.0–15.0)
MCHC: 31.9 g/dL (ref 30.0–36.0)
RBC: 3.82 MIL/uL — ABNORMAL LOW (ref 3.87–5.11)
WBC: 18.4 10*3/uL — ABNORMAL HIGH (ref 4.0–10.5)

## 2011-07-18 MED ORDER — AZITHROMYCIN 500 MG PO TABS
500.0000 mg | ORAL_TABLET | Freq: Every day | ORAL | Status: DC
  Administered 2011-07-18: 500 mg via ORAL
  Filled 2011-07-18: qty 1

## 2011-07-18 MED ORDER — ZOLPIDEM TARTRATE 5 MG PO TABS
5.0000 mg | ORAL_TABLET | Freq: Every evening | ORAL | Status: DC | PRN
  Administered 2011-07-19: 5 mg via ORAL
  Filled 2011-07-18: qty 1

## 2011-07-18 MED ORDER — AZITHROMYCIN 250 MG PO TABS
250.0000 mg | ORAL_TABLET | Freq: Every day | ORAL | Status: DC
  Administered 2011-07-19 – 2011-07-20 (×2): 250 mg via ORAL
  Filled 2011-07-18 (×2): qty 1

## 2011-07-18 MED ORDER — ENOXAPARIN SODIUM 80 MG/0.8ML ~~LOC~~ SOLN
70.0000 mg | SUBCUTANEOUS | Status: DC
  Administered 2011-07-19 – 2011-07-21 (×3): 70 mg via SUBCUTANEOUS
  Filled 2011-07-18 (×4): qty 0.8

## 2011-07-18 MED ORDER — CEPASTAT 14.5 MG MT LOZG
1.0000 | LOZENGE | OROMUCOSAL | Status: DC | PRN
  Filled 2011-07-18: qty 18

## 2011-07-18 MED ORDER — AMLODIPINE BESYLATE 5 MG PO TABS
5.0000 mg | ORAL_TABLET | Freq: Every day | ORAL | Status: DC
  Administered 2011-07-18 – 2011-07-20 (×4): 5 mg via ORAL
  Filled 2011-07-18 (×3): qty 1

## 2011-07-18 MED ORDER — PREDNISONE 20 MG PO TABS
40.0000 mg | ORAL_TABLET | Freq: Every day | ORAL | Status: DC
  Administered 2011-07-18 – 2011-07-21 (×4): 40 mg via ORAL
  Filled 2011-07-18 (×5): qty 2

## 2011-07-18 NOTE — Progress Notes (Signed)
Rebecca Brooks is a pleasant 60 y.o. female patient who has had cough on and off since October. She came in with worsening symptoms including SOB, raising concern for PE. A CT chest showed a focal patchy airspace opacity within the left lower lobe concerning for PNA. Her wbc was 19147. Patient also had pain and swelling of left leg concerning for dvt. Venous doppler was negative. A 2decho shwoed normal EF, not sure whether there is pulmonary htn fromn the report. Given her obesity, I would be concerned about pulmonary htn. She admits to snoring at night. She will need sleep study outpatient. She is looking for a PCP in the [place] area.  SUBJECTIVE Feels better. Slept well.   1. Dyspnea   2. Pneumonia   3. Hypoxia   4. Leukocytosis     Past Medical History  Diagnosis Date  . Pneumonia     this October from which she has said inhailer  . Obesity   . Arthritis   . Hypothyroidism   . Hypertension   . GERD (gastroesophageal reflux disease)   . Hyperlipidemia    Current Facility-Administered Medications  Medication Dose Route Frequency Provider Last Rate Last Dose  . acetaminophen (TYLENOL) tablet 650 mg  650 mg Oral Q6H PRN Gery Pray, MD   650 mg at 07/17/11 2016  . ipratropium (ATROVENT) nebulizer solution 0.5 mg  0.5 mg Nebulization Q4H PRN Debby Crosley, MD   0.5 mg at 07/18/11 0039   And  . albuterol (PROVENTIL) (5 MG/ML) 0.5% nebulizer solution 2.5 mg  2.5 mg Nebulization Q4H PRN Debby Crosley, MD   2.5 mg at 07/18/11 0039  . alum & mag hydroxide-simeth (MAALOX/MYLANTA) 200-200-20 MG/5ML suspension 30 mL  30 mL Oral Q6H PRN Debby Crosley, MD      . amLODipine (NORVASC) tablet 5 mg  5 mg Oral Daily Marianne Golightly      . antiseptic oral rinse (BIOTENE) solution 15 mL  15 mL Mouth Rinse BID Hattie Pine   15 mL at 07/18/11 0835  . benzonatate (TESSALON) capsule 100 mg  100 mg Oral TID PRN Gery Pray, MD   100 mg at 07/17/11 1420  . dextromethorphan-guaiFENesin (MUCINEX DM)  30-600 MG per 12 hr tablet 1 tablet  1 tablet Oral BID Gery Pray, MD   1 tablet at 07/17/11 2145  . diclofenac (VOLTAREN) EC tablet 75 mg  75 mg Oral BID Jairen Goldfarb   75 mg at 07/18/11 0000  . enoxaparin (LOVENOX) injection 70 mg  70 mg Subcutaneous Q24H Torry Adamczak   70 mg at 07/18/11 0827  . levothyroxine (SYNTHROID, LEVOTHROID) tablet 100 mcg  100 mcg Oral QAC breakfast Gery Pray, MD   100 mcg at 07/18/11 0827  . lisinopril (PRINIVIL,ZESTRIL) tablet 10 mg  10 mg Oral Daily Debby Crosley, MD   10 mg at 07/17/11 0927  . loratadine (CLARITIN) tablet 10 mg  10 mg Oral Daily Debby Crosley, MD   10 mg at 07/17/11 0927  . LORazepam (ATIVAN) tablet 0.5 mg  0.5 mg Oral QHS PRN Ryer Asato   0.5 mg at 07/17/11 2146  . LORazepam (ATIVAN) tablet 0.5 mg  0.5 mg Oral Once Mio Schellinger   0.5 mg at 07/18/11 0000  . ondansetron (ZOFRAN) tablet 4 mg  4 mg Oral Q6H PRN Debby Crosley, MD       Or  . ondansetron (ZOFRAN) injection 4 mg  4 mg Intravenous Q6H PRN Debby Crosley, MD      . pantoprazole (  PROTONIX) EC tablet 20 mg  20 mg Oral Daily Debby Crosley, MD   20 mg at 07/17/11 0927  . piperacillin-tazobactam (ZOSYN) IVPB 3.375 g  3.375 g Intravenous Q8H Leann Trefz Poindexter, PHARMD   3.375 g at 07/18/11 0216  . pneumococcal 23 valent vaccine (PNU-IMMUNE) injection 0.5 mL  0.5 mL Intramuscular Tomorrow-1000 Aman Bonet      . predniSONE (DELTASONE) tablet 40 mg  40 mg Oral Q breakfast Hamed Debella      . simvastatin (ZOCOR) tablet 10 mg  10 mg Oral QHS Debby Crosley, MD   10 mg at 07/17/11 2146  . sodium chloride 0.9 % injection 3 mL  3 mL Intravenous PRN Debby Crosley, MD      . sodium chloride 0.9 % nebulizer solution           . vancomycin (VANCOCIN) 1,250 mg in sodium chloride 0.9 % 250 mL IVPB  1,250 mg Intravenous Q12H Leann Trefz Poindexter, PHARMD   1,250 mg at 07/18/11 0216  . DISCONTD: enoxaparin (LOVENOX) injection 140 mg  140 mg Subcutaneous Q12H Christine Elizabeth [place], [place]       . DISCONTD: enoxaparin (LOVENOX) injection 70 mg  70 mg Subcutaneous Q24H Debby Crosley, MD   70 mg at 07/17/11 0928  . DISCONTD: methylPREDNISolone sodium succinate (SOLU-MEDROL) 40 MG injection 40 mg  40 mg Intravenous Q12H Debby Crosley, MD   40 mg at 07/18/11 0200  . DISCONTD: pneumococcal 13-valent conjugate vaccine (PREVNAR 13) injection 0.5 mL  0.5 mL Intramuscular Tomorrow-1000 Aldrich Lloyd       No Known Allergies Principal Problem:  *Pneumonia Active Problems:  Obesity (BMI 30-39.9)  Hypothyroidism  Hyperlipidemia  Hypertension   Vital signs in last 24 hours: Temp:  [97.4 F (36.3 C)-98.1 F (36.7 C)] 97.4 F (36.3 C) (12/25 0524) Pulse Rate:  [100-115] 109  (12/25 0524) Resp:  [18-20] 20  (12/25 0524) BP: (128-154)/(73-94) 146/84 mmHg (12/25 0524) SpO2:  [93 %-100 %] 96 % (12/25 0524) Weight change:  Last BM Date: 07/17/11  Intake/Output from previous day: 12/24 0701 - 12/25 0700 In: 2205 [P.O.:1320; IV Piggyback:885] Out: 2150 [Urine:2150] Intake/Output this shift:    Lab Results:  Basename 07/18/11 0335 07/17/11 0416  WBC 18.4* 19.7*  HGB 11.3* 12.3  HCT 35.4* 37.4  PLT 336 353   BMET  Basename 07/18/11 0335 07/17/11 0416  NA 144 138  K 3.7 3.4*  CL 105 100  CO2 29 28  GLUCOSE 145* 117*  BUN 14 13  CREATININE 0.84 0.80  CALCIUM 9.2 9.3    Studies/Results: Ct Angio Chest W/cm &/or Wo Cm  07/16/2011  *RADIOLOGY REPORT*  Clinical Data:  Persistent cough, shortness of breath and wheezing.  CT ANGIOGRAPHY CHEST WITH CONTRAST  Technique:  Multidetector CT imaging of the chest was performed using the standard protocol during bolus administration of intravenous contrast.  Multiplanar CT image reconstructions including MIPs were obtained to evaluate the vascular anatomy.  Contrast: OMNIPAQUE IOHEXOL 300 MG/ML IV SOLN  Comparison:  None  Findings:  There is no evidence of significant pulmonary embolus. Evaluation for pulmonary embolus is  suboptimal due to limitations in the timing of the contrast bolus.  Focal patchy airspace opacity is noted within the left lower lobe, with two defined foci measuring 1.8 cm and 1.7 cm in size.  This raises concern for pneumonia, though given mild left hilar lymphadenopathy, malignancy could conceivably have a similar appearance.  There is no evidence of pleural effusion or pneumothorax.  No masses are identified; no abnormal focal contrast enhancement is seen.  A mildly prominent left hilar node is noted, measuring 1.1 cm in short axis.  No significant mediastinal lymphadenopathy is seen. The mediastinum is unremarkable in appearance.  No pericardial effusion is identified.  The great vessels are unremarkable in appearance.  No axillary lymphadenopathy is seen.  The thyroid gland is unremarkable in appearance.  The visualized portions of the liver and spleen are unremarkable.  No acute osseous abnormalities are seen.  Marked subcortical cystic change is noted at the right humeral head, with loss of the joint space, reflecting mild degenerative change.  Review of the MIP images confirms the above findings.  IMPRESSION:  1.  No evidence of significant pulmonary embolus. 2.  Focal patchy airspace opacity within the left lower lobe.  This is concerning for pneumonia, though given mild left hilar lymphadenopathy and the focal appearance of the opacity, malignancy could conceivably have a similar appearance.  If the patient has symptoms for pneumonia, would treat for pneumonia, then perform follow-up chest radiograph to ensure resolution.  Otherwise, additional evaluation would be warranted.  3.  Mildly enlarged left hilar node, measuring 0.1 cm in short axis. 4.  Marked degenerative change at the right humeral head.  Original Report Authenticated By: Tonia Ghent, M.D.    Medications: I have reviewed the patient's current medications.   Physical exam GENERAL- morbidly obese, alert and well HEAD- normal  atraumatic, no neck masses, normal thyroid, no jvd RESPIRATORY- appears well, vitals normal, no respiratory distress, acyanotic, normal RR, ear and throat exam is normal, neck free of mass or lymphadenopathy, chest clear, no wheezing, crepitations, rhonchi, normal symmetric air entry CVS- regular rate and rhythm, S1, S2 normal, no murmur, click, rub or gallop ABDOMEN- abdomen is soft without significant tenderness, masses, organomegaly or guarding NEURO- Grossly normal EXTREMITIES-  left leg slightly more swollen compared to right but patient says it is sometimes like that.  Plan   *Pneumonia- some clinical improvement. Less cough. Wbc some better. Gram positive cocci in respiratory culture. Follow ID. Element of steroids. Transition to prednisone. May need follow up CT in a couple of weeks to ensure clearance.  *Obesity (BMI 30-39.9)- concern for OSA, needs sleep study outpatient. Will defer to her PCP.  *Hypothyroidism- on synthroid. Check tsh  * Hyperlipidemia  *Hypertension- uncontrolled. Add norvasc.  *dvt/gi prophylaxis.    Amaru Burroughs 07/18/2011 9:08 AM Pager: [phone].

## 2011-07-19 LAB — COMPREHENSIVE METABOLIC PANEL
Albumin: 2.7 g/dL — ABNORMAL LOW (ref 3.5–5.2)
Alkaline Phosphatase: 78 U/L (ref 39–117)
BUN: 18 mg/dL (ref 6–23)
CO2: 30 mEq/L (ref 19–32)
Chloride: 104 mEq/L (ref 96–112)
Creatinine, Ser: 1.06 mg/dL (ref 0.50–1.10)
GFR calc Af Amer: 64 mL/min — ABNORMAL LOW (ref >90)
GFR calc non Af Amer: 55 mL/min — ABNORMAL LOW (ref >90)
Glucose, Bld: 114 mg/dL — ABNORMAL HIGH (ref 70–99)
Sodium: 139 mEq/L (ref 135–145)
Total Bilirubin: 0.2 mg/dL — ABNORMAL LOW (ref 0.3–1.2)

## 2011-07-19 LAB — CBC
MCH: 29.6 pg (ref 26.0–34.0)
MCHC: 31.8 g/dL (ref 30.0–36.0)
Platelets: 332 10*3/uL (ref 150–400)
RBC: 3.75 MIL/uL — ABNORMAL LOW (ref 3.87–5.11)
RDW: 14.5 % (ref 11.5–15.5)

## 2011-07-19 MED ORDER — ZOLPIDEM TARTRATE 10 MG PO TABS
10.0000 mg | ORAL_TABLET | Freq: Every evening | ORAL | Status: DC | PRN
  Administered 2011-07-19 – 2011-07-20 (×2): 10 mg via ORAL
  Filled 2011-07-19 (×2): qty 1

## 2011-07-19 MED ORDER — IPRATROPIUM BROMIDE 0.02 % IN SOLN
0.5000 mg | Freq: Four times a day (QID) | RESPIRATORY_TRACT | Status: DC
  Administered 2011-07-19 – 2011-07-20 (×3): 0.5 mg via RESPIRATORY_TRACT
  Filled 2011-07-19 (×3): qty 2.5

## 2011-07-19 MED ORDER — IPRATROPIUM BROMIDE 0.02 % IN SOLN
0.5000 mg | Freq: Four times a day (QID) | RESPIRATORY_TRACT | Status: DC
  Filled 2011-07-19: qty 2.5

## 2011-07-19 MED ORDER — ALBUTEROL SULFATE (5 MG/ML) 0.5% IN NEBU
2.5000 mg | INHALATION_SOLUTION | Freq: Four times a day (QID) | RESPIRATORY_TRACT | Status: DC
  Administered 2011-07-19 – 2011-07-20 (×2): 2.5 mg via RESPIRATORY_TRACT
  Filled 2011-07-19 (×3): qty 0.5

## 2011-07-19 MED ORDER — POTASSIUM CHLORIDE CRYS ER 20 MEQ PO TBCR
40.0000 meq | EXTENDED_RELEASE_TABLET | Freq: Once | ORAL | Status: AC
  Administered 2011-07-19: 40 meq via ORAL
  Filled 2011-07-19: qty 2

## 2011-07-19 MED ORDER — IPRATROPIUM BROMIDE 0.02 % IN SOLN
0.5000 mg | Freq: Four times a day (QID) | RESPIRATORY_TRACT | Status: DC
  Administered 2011-07-19: 0.5 mg via RESPIRATORY_TRACT

## 2011-07-19 MED ORDER — ALBUTEROL SULFATE (5 MG/ML) 0.5% IN NEBU: 2.5000 mg | INHALATION_SOLUTION | RESPIRATORY_TRACT | Status: DC

## 2011-07-19 MED ORDER — ALBUTEROL SULFATE (5 MG/ML) 0.5% IN NEBU
2.5000 mg | INHALATION_SOLUTION | RESPIRATORY_TRACT | Status: DC | PRN
  Filled 2011-07-19: qty 0.5

## 2011-07-19 MED ORDER — IPRATROPIUM BROMIDE 0.02 % IN SOLN: 0.5000 mg | Freq: Four times a day (QID) | RESPIRATORY_TRACT | Status: DC

## 2011-07-19 MED ORDER — PANTOPRAZOLE SODIUM 40 MG PO TBEC
40.0000 mg | DELAYED_RELEASE_TABLET | Freq: Every day | ORAL | Status: DC
  Filled 2011-07-19: qty 1

## 2011-07-19 NOTE — Progress Notes (Signed)
Summary  Rebecca Brooks is a pleasant 60 y.o. female patient who has had cough on and off since October. She came in with worsening symptoms including SOB, raising concern for PE. A CT chest showed a focal patchy airspace opacity within the left lower lobe concerning for PNA. Her wbc was 96295. Patient also had pain and swelling of left leg concerning for dvt. Venous doppler was negative. A 2decho shwoed normal EF, not sure whether there is pulmonary htn fromn the report. Given her obesity, I would be concerned about pulmonary htn. She admits to snoring at night (mild). She will need sleep study outpatient. She is looking for a PCP in the [place] area. She moved residences in the past when she had significant cough and was attributed to mold in the house. They moved into their current house in July. The house had been empty for 7 years. She smoked for 5 years in her 16s and has quit for 40+ years.      Subjective: Chart reviewed. Patient indicates that she feels significantly better with improvement in her dyspnea. She has no chest pain or chest tightness. She has intermittent mostly nonproductive cough.  Objective: Blood pressure 135/80, pulse 100, temperature 98 F (36.7 C), temperature source Oral, resp. rate 22, height 5\' 3"  (1.6 m), weight 140.116 kg (308 lb 14.4 oz), SpO2 96.00%.  Intake/Output Summary (Last 24 hours) at 07/19/11 1554 Last data filed at 07/19/11 1300  Gross per 24 hour  Intake    600 ml  Output   1600 ml  Net  -1000 ml   General exam: Morbidly obese female patient was in no obvious distress and lying supine in bed. Respiratory system: Bilateral occasional expiratory rhonchi but fair breath sounds. No increased work of breathing. Cardiovascular system: First and second heart sounds heard, regular. No JVD or murmur. Gastrointestinal system: Abdomen is nondistended, soft and normal bowel sounds heard. Central nervous system: Alert and oriented. No focal neurological  deficits. Extremities: Trace bilateral ankle edema.  Lab Results: Basic Metabolic Panel:  Basename 07/19/11 0347 07/18/11 0335  NA 139 144  K 3.2* 3.7  CL 104 105  CO2 30 29  GLUCOSE 114* 145*  BUN 18 14  CREATININE 1.06 0.84  CALCIUM 8.6 9.2  MG -- --  PHOS -- --   Liver Function Tests:  Basename 07/19/11 0347 07/18/11 0335  AST 10 12  ALT 14 15  ALKPHOS 78 92  BILITOT 0.2* 0.3  PROT 5.7* 6.5  ALBUMIN 2.7* 3.0*   No results found for this basename: LIPASE:2,AMYLASE:2 in the last 72 hours No results found for this basename: AMMONIA:2 in the last 72 hours CBC:  Basename 07/19/11 0347 07/18/11 0335 07/16/11 2115  WBC 19.3* 18.4* --  NEUTROABS -- -- 15.6*  HGB 11.1* 11.3* --  HCT 34.9* 35.4* --  MCV 93.1 92.7 --  PLT 332 336 --   Cardiac Enzymes:  Basename 07/16/11 2106  CKTOTAL 149  CKMB 3.2  CKMBINDEX --  TROPONINI <0.30   BNP:  Basename 07/16/11 2106  PROBNP 164.5*   D-Dimer: No results found for this basename: DDIMER:2 in the last 72 hours CBG: No results found for this basename: GLUCAP:6 in the last 72 hours Hemoglobin A1C: No results found for this basename: HGBA1C in the last 72 hours Fasting Lipid Panel:  Basename 07/18/11 0335  CHOL 192  HDL 72  LDLCALC 106*  TRIG 72  CHOLHDL 2.7  LDLDIRECT --   Thyroid Function Tests:  [employer]  07/18/11 0335  TSH 0.451  T4TOTAL --  FREET4 --  T3FREE --  THYROIDAB --   Micro Results: Recent Results (from the past 240 hour(s))  CULTURE, RESPIRATORY     Status: Normal (Preliminary result)   Collection Time   07/17/11  9:33 AM      Component Value Range Status Comment   Specimen Description SPUTUM   Final    Special Requests NONE   Final    Gram Stain     Final    Value: RARE WBC PRESENT, PREDOMINANTLY PMN     NO SQUAMOUS EPITHELIAL CELLS SEEN     ABUNDANT GRAM POSITIVE COCCI     IN PAIRS IN CLUSTERS   Culture NORMAL OROPHARYNGEAL FLORA   Final    Report Status PENDING   Incomplete      Studies/Results: Ct Angio Chest W/cm &/or Wo Cm  07/16/2011  *RADIOLOGY REPORT*  Clinical Data:  Persistent cough, shortness of breath and wheezing.  CT ANGIOGRAPHY CHEST WITH CONTRAST  IMPRESSION:  1.  No evidence of significant pulmonary embolus. 2.  Focal patchy airspace opacity within the left lower lobe.  This is concerning for pneumonia, though given mild left hilar lymphadenopathy and the focal appearance of the opacity, malignancy could conceivably have a similar appearance.  If the patient has symptoms for pneumonia, would treat for pneumonia, then perform follow-up chest radiograph to ensure resolution.  Otherwise, additional evaluation would be warranted.  3.  Mildly enlarged left hilar node, measuring 0.1 cm in short axis. 4.  Marked degenerative change at the right humeral head.  Original Report Authenticated By: Tonia Ghent, M.D.   Mm Digital Screening  Medications: Scheduled Meds:   . albuterol  2.5 mg Nebulization Q6H   And  . ipratropium  0.5 mg Nebulization Q6H  . amLODipine  5 mg Oral Daily  . antiseptic oral rinse  15 mL Mouth Rinse BID  . azithromycin  250 mg Oral Daily  . dextromethorphan-guaiFENesin  1 tablet Oral BID  . diclofenac  75 mg Oral BID  . enoxaparin (LOVENOX) injection  70 mg Subcutaneous Q24H  . levothyroxine  100 mcg Oral QAC breakfast  . lisinopril  10 mg Oral Daily  . loratadine  10 mg Oral Daily  . pantoprazole  20 mg Oral Daily  . piperacillin-tazobactam (ZOSYN)  IV  3.375 g Intravenous Q8H  . predniSONE  40 mg Oral Q breakfast  . simvastatin  10 mg Oral QHS  . vancomycin  1,250 mg Intravenous Q12H  . DISCONTD: albuterol  2.5 mg Nebulization Q4H  . DISCONTD: albuterol  2.5 mg Nebulization Q4H  . DISCONTD: enoxaparin  70 mg Subcutaneous Q24H  . DISCONTD: ipratropium  0.5 mg Nebulization Q6H  . DISCONTD: ipratropium  0.5 mg Nebulization Q6H  . DISCONTD: ipratropium  0.5 mg Nebulization Q6H   Continuous Infusions:  PRN  Meds:.acetaminophen, alum & mag hydroxide-simeth, benzonatate, LORazepam, ondansetron (ZOFRAN) IV, ondansetron, phenol-menthol, sodium chloride, zolpidem, DISCONTD: albuterol, DISCONTD: albuterol, DISCONTD: ipratropium  Assessment/Plan: 1. Community acquired left lower lobe pneumonia: Patient did not improve with Rocephin, clarithromycin and Levaquin as an outpatient. Currently she is on Zosyn, vancomycin and azithromycin. Cultures are negative thus far and may consider narrowing the antibiotic spectrum. Will start with discontinuing vancomycin. Her cough may be related to ACE inhibitors versus cough radiant asthma. We'll discontinue lisinopril. 2. Acute respiratory failure/Dyspnea and cough secondary to pneumonia complicating underlying? Asthma versus severe GERD: Also rule out obstructive sleep apnea. Pulmonology consulted for assistance in  evaluation and management. Continue oral prednisone. 3. Hypokalemia: Replete and follow BMP 4. Hypertension: Reasonably controlled. Discontinue lisinopril and may increase amlodipine. 5. Hypothyroidism: Continue Synthroid 6. Morbid obesity 7. Anemia: Stable 8. Leukocytosis:? Secondary to pneumonia versus steroids.   Discussed at length with patient's spouse was at the bedside.   Rebecca Brooks 07/19/2011, 3:54 PM

## 2011-07-19 NOTE — Progress Notes (Signed)
Physical Therapy Evaluation Patient Details Name: Rebecca Brooks MRN: [id number] DOB: Jan 26, 1951 Today's Date: 07/19/2011 9:45-10:30 EV  Recommend follow up at [facility] [phone]) and eventually to outpatient PT.  Pt has generalized muscle deconditioning with desaturation on O2 to 83% and SOB upon ambutaion  Problem List:  Patient Active Problem List  Diagnoses  . Pneumonia  . Obesity (BMI 30-39.9)  . Hypothyroidism  . Hyperlipidemia  . Hypertension    Past Medical History:  Past Medical History  Diagnosis Date  . Pneumonia     this October from which she has said inhailer  . Obesity   . Arthritis   . Hypothyroidism   . Hypertension   . GERD (gastroesophageal reflux disease)   . Hyperlipidemia    Past Surgical History:  Past Surgical History  Procedure Date  . Joint replacement     right hip replacement  . Thyroidectomy   . Cesarean section   . Hernia repair   . Tonsillectomy     PT Assessment/Plan/Recommendation PT Assessment Clinical Impression Statement: pt with obesity, frequent coughing with O2 desturation to 83% with ambuation,  She has generalized deconditioning with some focal decrease of hip abductor strength since THR.  Feet she would benefit from Pulmonary Rehab at [facility] with progression to outpatient PT for core and general strengthening PT Recommendation/Assessment: Patient will need skilled PT in the acute care venue PT Problem List: Decreased strength;Decreased activity tolerance;Cardiopulmonary status limiting activity;Obesity Barriers to Discharge: None PT Therapy Diagnosis : Generalized weakness PT Plan PT Frequency: Min 3X/week PT Treatment/Interventions: Gait training;Stair training;Therapeutic exercise;Patient/family education PT Recommendation Follow Up Recommendations:  (Pulmonary Rehab at [facility] [phone] pt has # )) Equipment Recommended: None recommended by PT PT Goals  Acute Rehab PT Goals PT Goal Formulation: With  patient Time For Goal Achievement: 2 weeks Pt will Ambulate: Independently;>150 feet (with O2 sats > 90%) PT Goal: Ambulate - Progress: Not met Pt will Go Up / Down Stairs: Flight;with modified independence (with O2 sats> 90%) PT Goal: Up/Down Stairs - Progress: Not met Pt will Perform Home Exercise Program: Independently PT Goal: Perform Home Exercise Program - Progress: Not met  PT Evaluation Precautions/Restrictions  Restrictions Weight Bearing Restrictions: No Prior Functioning  Home Living Lives With: Spouse Receives Help From: Family (help with housework) Type of Home: House Home Layout: Two level Alternate Level Stairs-Rails: Right Alternate Level Stairs-Number of Steps: almost to the top, then a landing and 4 more Home Access: Stairs to enter Entrance Stairs-Rails: None Entrance Stairs-Number of Steps: 2 Additional Comments: pt continues to have problems with frunction from THR on RLE and with L shoulder Prior Function Level of Independence: Independent with gait;Independent with basic ADLs;Needs assistance with homemaking Driving: Yes Vocation:  (right now is between jobs, will start with new job in Jan) Cognition Cognition Arousal/Alertness: Awake/alert Overall Cognitive Status: Appears within functional limits for tasks assessed Sensation/Coordination Sensation Light Touch: Appears Intact Stereognosis: Appears Intact Hot/Cold: Appears Intact Proprioception: Appears Intact Coordination Gross Motor Movements are Fluid and Coordinated: Yes Fine Motor Movements are Fluid and Coordinated: Yes Extremity Assessment RLE Assessment RLE Assessment: Within Functional Limits (pt with some hip abductor weakness) LLE Assessment LLE Assessment: Within Functional Limits Mobility (including Balance) Bed Mobility Bed Mobility: Yes Rolling Right: 7: Independent Rolling Left: 7: Independent Right Sidelying to Sit: 7: Independent Left Sidelying to Sit: 7: Independent Supine  to Sit: 7: Independent Sitting - Scoot to Edge of Bed: 7: Independent Sit to Supine - Right: 7: Independent Sit  to Supine - Left: 7: Independent Scooting to HOB: 7: Independent Transfers Transfers: Yes Sit to Stand: 7: Independent Stand to Sit: 7: Independent Ambulation/Gait Ambulation/Gait: Yes Ambulation/Gait Assistance: 5: Supervision Ambulation/Gait Assistance Details (indicate cue type and reason): pt able to walk without device , but has increased lateral weight shift. Pt also with increased trunkal obesity, desaturation with increased distance of walking Ambulation Distance (Feet): 300 Feet Assistive device: None Gait Pattern: Lateral trunk lean to right;Lateral trunk lean to left Stairs: No Wheelchair Mobility Wheelchair Mobility: No  Posture/Postural Control Posture/Postural Control: Postural limitations Postural Limitations: anterior trunk obestity with  Exercise  General Exercises - Lower Extremity Ankle Circles/Pumps: AROM;Both;5 reps;Supine Quad Sets: AROM;Both;5 reps;Supine Gluteal Sets: AROM;Both;5 reps;Supine Heel Slides: AROM;Both;5 reps;Supine Hip ABduction/ADduction: AROM;Strengthening;Both;5 reps;Sidelying Hip Flexion/Marching: AROM;Both;Standing Heel Raises: AROM;Both;Standing Mini-Sqauts: Other (comment) (sit to stand with isometrics in standing) Other Exercises Other Exercises: bilateral upper extremity hip to hip with core activation End of Session PT - End of Session Activity Tolerance: Patient limited by fatigue;Other (comment) (pt with SOB with ambutation with decreased O2sat) Patient left: in chair Nurse Communication: Mobility status for ambulation General Behavior During Session: [facility] for tasks performed Cognition: [facility] for tasks performed  Donnetta Hail 07/19/2011, 11:30 AM

## 2011-07-20 DIAGNOSIS — R059 Cough, unspecified: Secondary | ICD-10-CM

## 2011-07-20 DIAGNOSIS — J189 Pneumonia, unspecified organism: Secondary | ICD-10-CM

## 2011-07-20 DIAGNOSIS — R05 Cough: Secondary | ICD-10-CM

## 2011-07-20 LAB — BASIC METABOLIC PANEL
Calcium: 8.9 mg/dL (ref 8.4–10.5)
GFR calc Af Amer: 78 mL/min — ABNORMAL LOW (ref >90)
GFR calc non Af Amer: 68 mL/min — ABNORMAL LOW (ref >90)
Glucose, Bld: 107 mg/dL — ABNORMAL HIGH (ref 70–99)
Potassium: 3.4 mEq/L — ABNORMAL LOW (ref 3.5–5.1)
Sodium: 142 mEq/L (ref 135–145)

## 2011-07-20 LAB — CULTURE, RESPIRATORY W GRAM STAIN: Culture: NORMAL

## 2011-07-20 MED ORDER — DM-GUAIFENESIN ER 30-600 MG PO TB12
2.0000 | ORAL_TABLET | Freq: Two times a day (BID) | ORAL | Status: DC
  Administered 2011-07-20 – 2011-07-21 (×3): 2 via ORAL
  Filled 2011-07-20 (×6): qty 2

## 2011-07-20 MED ORDER — FAMOTIDINE 20 MG PO TABS
20.0000 mg | ORAL_TABLET | Freq: Every day | ORAL | Status: DC
  Administered 2011-07-20: 20 mg via ORAL
  Filled 2011-07-20 (×3): qty 1

## 2011-07-20 MED ORDER — AMLODIPINE BESYLATE 10 MG PO TABS
10.0000 mg | ORAL_TABLET | Freq: Every day | ORAL | Status: DC
  Administered 2011-07-21: 10 mg via ORAL
  Filled 2011-07-20 (×2): qty 1

## 2011-07-20 MED ORDER — POTASSIUM CHLORIDE CRYS ER 20 MEQ PO TBCR
40.0000 meq | EXTENDED_RELEASE_TABLET | Freq: Once | ORAL | Status: AC
  Administered 2011-07-20: 40 meq via ORAL
  Filled 2011-07-20: qty 2

## 2011-07-20 MED ORDER — TRAMADOL HCL 50 MG PO TABS
50.0000 mg | ORAL_TABLET | Freq: Four times a day (QID) | ORAL | Status: DC
  Administered 2011-07-20 – 2011-07-21 (×6): 50 mg via ORAL
  Filled 2011-07-20 (×12): qty 1

## 2011-07-20 MED ORDER — PANTOPRAZOLE SODIUM 40 MG PO TBEC
40.0000 mg | DELAYED_RELEASE_TABLET | Freq: Two times a day (BID) | ORAL | Status: DC
  Administered 2011-07-20 – 2011-07-21 (×4): 40 mg via ORAL
  Filled 2011-07-20 (×5): qty 1

## 2011-07-20 MED ORDER — ALBUTEROL SULFATE (5 MG/ML) 0.5% IN NEBU
2.5000 mg | INHALATION_SOLUTION | Freq: Four times a day (QID) | RESPIRATORY_TRACT | Status: DC
  Administered 2011-07-20 – 2011-07-21 (×5): 2.5 mg via RESPIRATORY_TRACT
  Filled 2011-07-20 (×5): qty 0.5

## 2011-07-20 MED ORDER — MOXIFLOXACIN HCL 400 MG PO TABS
400.0000 mg | ORAL_TABLET | Freq: Every day | ORAL | Status: DC
  Administered 2011-07-20 – 2011-07-21 (×3): 400 mg via ORAL
  Filled 2011-07-20 (×3): qty 1

## 2011-07-20 NOTE — Consult Note (Signed)
Name: Rebecca Brooks MRN: [id number] DOB: 1950-12-24    LOS: 4  PCCM ADMISSION NOTE  History of Present Illness:  60 yowf morbid obesity minimal smoking hx ? Dx of asthma in the 1990's with refractory cough on ACEI on admit 12/23 asked by [facility] to evaluate as inpt with abn CT chest 12/23  Present illness began in October with predominantly dry cough variably dx as bronchitis/ flu/ walking pna but never responded to outpt rx assoc with coughing so hard gags/ urinary incont/ looses breath and voice. ACEI stopped this admit.  Feeling better but abn CT (see below)   Denies any obvious fluctuation of symptoms with weather or environmental changes or other aggravating or alleviating factors except as outlined above   Concern with snoring but denies excess hypersomnolence    Cultures: Sputum 12/24 > nl flora  Antibiotics: Vanc/zoysn 12/23 (?hcap) >>>         Past Medical History  Diagnosis Date  . Pneumonia     this October from which she has said inhailer  . Obesity   . Arthritis   . Hypothyroidism   . Hypertension   . GERD (gastroesophageal reflux disease)   . Hyperlipidemia    Past Surgical History  Procedure Date  . Joint replacement     right hip replacement  . Thyroidectomy   . Cesarean section   . Hernia repair   . Tonsillectomy    Prior to Admission medications   Medication Sig Start Date End Date Taking? Authorizing Provider  acetaminophen (TYLENOL) 325 MG tablet Take 650 mg by mouth every 6 (six) hours as needed. As needed for pain    Yes Historical Provider, MD  albuterol (PROVENTIL) (2.5 MG/3ML) 0.083% nebulizer solution Take 2.5 mg by nebulization every 6 (six) hours as needed. For breathing   Yes Historical Provider, MD  dextromethorphan-guaiFENesin (MUCINEX DM) 30-600 MG per 12 hr tablet Take 1 tablet by mouth every 12 (twelve) hours.     Yes Historical Provider, MD  diclofenac-misoprostol (ARTHROTEC 75) 75-200 MG-MCG per tablet Take 1 tablet by mouth 2 (two)  times daily.    Yes Historical Provider, MD  levothyroxine (SYNTHROID, LEVOTHROID) 100 MCG tablet Take 100 mcg by mouth daily.     Yes Historical Provider, MD  loratadine (CLARITIN) 10 MG tablet Take 10 mg by mouth daily.     Yes Historical Provider, MD  pantoprazole (PROTONIX) 20 MG tablet Take 20 mg by mouth daily.     Yes Historical Provider, MD  quinapril (ACCUPRIL) 10 MG tablet Take 10 mg by mouth at bedtime.     Yes Historical Provider, MD  simvastatin (ZOCOR) 10 MG tablet Take 10 mg by mouth at bedtime.     Yes Historical Provider, MD  Vitamin D, Ergocalciferol, (DRISDOL) 50000 UNITS CAPS Take 50,000 Units by mouth.     Yes Historical Provider, MD   Allergies No Known Allergies  Family History Family History  Problem Relation Age of Onset  . Cancer Mother   . Osteoarthritis Father   . Hyperlipidemia Father   . Hyperlipidemia Sister   . Migraines Sister     Social History  reports that she has never smoked. She does not have any smokeless tobacco history on file. She reports that she drinks about 8.4 ounces of alcohol per week. She reports that she does not use illicit drugs.  Review Of Systems  11 points review of systems is negative with an exception of listed in HPI.  Vital Signs: Temp:  [  96.4 F (35.8 C)-98 F (36.7 C)] 96.4 F (35.8 C) (12/27 0430) Pulse Rate:  [100-110] 110  (12/27 0430) Resp:  [18-22] 18  (12/27 0430) BP: (131-147)/(62-98) 147/98 mmHg (12/27 0430) SpO2:  [83 %-97 %] 97 % (12/27 0430) I/O last 3 completed shifts: In: 600 [P.O.:600] Out: 900 [Urine:900]  Physical Examination: Obese hoarse wf nad on np HEENT: nl dentition, turbinates, and orophanx. Nl external ear canals without cough reflex   NECK :  without JVD/Nodes/TM/ nl carotid upstrokes bilaterally   LUNGS: no acc muscle use, clear to A and P bilaterally without cough on insp or exp maneuvers   CV:  RRR  no s3 or murmur or increase in P2, no edema   ABD:  soft and nontender with nl  excursion in the supine position. No bruits or organomegaly, bowel sounds nl  MS:  warm without deformities, calf tenderness, cyanosis or clubbing  SKIN: warm and dry without lesions    NEURO:  alert, approp, no deficits        Labs and Imaging:   CT Chest 12/23 2. Focal patchy airspace opacity within the left lower lobe. This  is concerning for pneumonia, though given mild left hilar  lymphadenopathy and the focal appearance of the opacity, malignancy  could conceivably have a similar appearance.  If the patient has symptoms for pneumonia, would treat for  pneumonia, then perform follow-up chest radiograph to ensure  resolution. Otherwise, additional evaluation would be warranted.  3. Mildly enlarged left hilar node, measuring 0.1 cm in short  axis.  4. Marked degenerative change at the right humeral head.     Assessment and Plan:  Chronic cough The most common causes of chronic cough in immunocompetent adults include the following: upper airway cough syndrome (UACS), previously referred to as postnasal drip syndrome (PNDS), which is caused by variety of rhinosinus conditions; (2) asthma; (3) GERD; (4) chronic bronchitis from cigarette smoking or other inhaled environmental irritants; (5) nonasthmatic eosinophilic bronchitis; and (6) bronchiectasis.   These conditions, singly or in combination, have accounted for up to 94% of the causes of chronic cough in prospective studies.   Other conditions have constituted no >6% of the causes in prospective studies These have included bronchogenic carcinoma, chronic interstitial pneumonia, sarcoidosis, left ventricular failure, ACEI-induced cough, and aspiration from a condition associated with pharyngeal dysfunction.   This is almost certainly  Classic Upper airway cough syndrome, so named because it's frequently impossible to sort out how much is  CR/sinusitis with freq throat clearing (which can be related to primary GERD)   vs  causing   secondary (" extra esophageal")  GERD from wide swings in gastric pressure that occur with throat clearing, often  promoting self use of mint and menthol lozenges that reduce the lower esophageal sphincter tone and exacerbate the problem further in a cyclical fashion.   These are the same pts who not infrequently have failed to tolerate ace inhibitors,  dry powder inhalers or biphosphonates or report having reflux symptoms that don't respond to standard doses of PPI , and are easily confused as having aecopd or asthma flares,   Rec First try off acei for 4 weeks, then ov Suppress cough with max gerd rx and tramadol until no longer coughing   2. ? CAP CT is very underwhelming but will need f/u as her plain cxr is nl > recheck in 4 weeks in office p rx x 10 days complete (ok to discharge on avelox)  3. HBP  ACE inhibitors are problematic in  pts with airway complaints because  even experienced pulmonologists can't always distinguish ace effects from copd/asthma.  By themselves they don't actually cause a problem, much like oxygen can't by itself start a fire, but they certainly serve as a powerful catalyst or enhancer for any "fire"  or inflammatory process in the upper airway, be it caused by an ET  tube or more commonly reflux (especially in the obese or pts with known GERD or who are on biphoshonates).    In the era of ARB near equivalency until we have a better handle on the reversibility of the airway problem, it just makes sense to avoid ACEI  entirely in the short run and then decide later, having established a level of airway control using a reasonable limited regimen, whether to add back ace but even then being very careful to observe the pt for worsening airway control and number of meds used/ needed to control symptoms.     4. ?  OSA > f/u as outpt    Sandrea Hughs, MD Pulmonary and Critical Care Medicine [facility] Cell [phone]

## 2011-07-20 NOTE — Progress Notes (Signed)
ANTIBIOTIC CONSULT NOTE - FOLLOW UP  Pharmacy Consult for Zosyn Indication: pneumonia  No Known Allergies  Patient Measurements: Height: 5\' 3"  (160 cm) Weight: 308 lb 14.4 oz (140.116 kg) IBW/kg (Calculated) : 52.4    Vital Signs: Temp: 96.4 F (35.8 C) (12/27 0430) Temp src: Oral (12/27 0430) BP: 147/98 mmHg (12/27 0430) Pulse Rate: 110  (12/27 0430) Intake/Output from previous day: 12/26 0701 - 12/27 0700 In: 240 [P.O.:240] Out: -  Intake/Output from this shift: Total I/O In: 360 [P.O.:360] Out: -   Labs:  Basename 07/20/11 0305 07/19/11 0347 07/18/11 0335  WBC -- 19.3* 18.4*  HGB -- 11.1* 11.3*  PLT -- 332 336  LABCREA -- -- --  CREATININE 0.90 1.06 0.84   Estimated Creatinine Clearance: 90.7 ml/min (by C-G formula based on Cr of 0.9).    Microbiology: Recent Results (from the past 720 hour(s))  CULTURE, RESPIRATORY     Status: Normal   Collection Time   07/17/11  9:33 AM      Component Value Range Status Comment   Specimen Description SPUTUM   Final    Special Requests NONE   Final    Gram Stain     Final    Value: RARE WBC PRESENT, PREDOMINANTLY PMN     NO SQUAMOUS EPITHELIAL CELLS SEEN     ABUNDANT GRAM POSITIVE COCCI     IN PAIRS IN CLUSTERS   Culture NORMAL OROPHARYNGEAL FLORA   Final    Report Status 07/20/2011 FINAL   Final     Anti-infectives     Start     Dose/Rate Route Frequency Ordered Stop   07/19/11 1000   azithromycin (ZITHROMAX) tablet 250 mg        250 mg Oral Daily 07/18/11 1149 07/23/11 0959   07/18/11 1300   azithromycin (ZITHROMAX) tablet 500 mg        500 mg Oral Daily 07/18/11 1149 07/18/11 1357   07/17/11 0300   vancomycin (VANCOCIN) 1,250 mg in sodium chloride 0.9 % 250 mL IVPB  Status:  Discontinued        1,250 mg 166.7 mL/hr over 90 Minutes Intravenous Every 12 hours 07/17/11 0218 07/19/11 1612   07/17/11 0300  piperacillin-tazobactam (ZOSYN) IVPB 3.375 g       3.375 g 12.5 mL/hr over 240 Minutes Intravenous Every  8 hours 07/17/11 0218            Assessment: 60 yo F on Day #4 Zosyn for CAP. Vancomycin was d/c yesterday. Also on D4/5 PO Azithromycin. Noted plan to d/c on po Avelox for total of 10 days Abx tx.  Goal of Therapy:  Appropriate dosing of Zosyn  Plan:  No change to Zosyn. Continue to monitor and adjust as necessary.  Gwen Her PharmD  [phone] 07/20/2011 11:38 AM

## 2011-07-20 NOTE — Progress Notes (Signed)
Summary  Rebecca Brooks is a pleasant 60 y.o. female patient who has had cough on and off since October. She came in with worsening symptoms including SOB, raising concern for PE. A CT chest showed a focal patchy airspace opacity within the left lower lobe concerning for PNA. Her wbc was 13086. Patient also had pain and swelling of left leg concerning for dvt. Venous doppler was negative. A 2decho shwoed normal EF, not sure whether there is pulmonary htn fromn the report. Given her obesity, I would be concerned about pulmonary htn. She admits to snoring at night (mild). She will need sleep study outpatient. She is looking for a PCP in the [place] area. She moved residences in the past when she had significant cough and was attributed to mold in the house. They moved into their current house in July. The house had been empty for 7 years. She smoked for 5 years in her 10s and has quit for 40+ years. Continues to improve overall.      Subjective: Dyspnea is improving day by day. Mildly congested chest with less cough which is mostly nonproductive.  Objective: Blood pressure 147/98, pulse 110, temperature 96.4 F (35.8 C), temperature source Oral, resp. rate 18, height 5\' 3"  (1.6 m), weight 140.116 kg (308 lb 14.4 oz), SpO2 98.00%.  Intake/Output Summary (Last 24 hours) at 07/20/11 1326 Last data filed at 07/20/11 0900  Gross per 24 hour  Intake    360 ml  Output      0 ml  Net    360 ml   General exam: Morbidly obese female patient was in no obvious distress and lying supine in bed. Respiratory system: Clear. No increased work of breathing. Cardiovascular system: First and second heart sounds heard, regular. No JVD or murmur. Gastrointestinal system: Abdomen is nondistended, soft and normal bowel sounds heard. Central nervous system: Alert and oriented. No focal neurological deficits. Extremities: Trace bilateral ankle edema.  Lab Results: Basic Metabolic Panel:  Basename 07/20/11  0305 07/19/11 0347  NA 142 139  K 3.4* 3.2*  CL 105 104  CO2 30 30  GLUCOSE 107* 114*  BUN 18 18  CREATININE 0.90 1.06  CALCIUM 8.9 8.6  MG -- --  PHOS -- --   Liver Function Tests:  Basename 07/19/11 0347 07/18/11 0335  AST 10 12  ALT 14 15  ALKPHOS 78 92  BILITOT 0.2* 0.3  PROT 5.7* 6.5  ALBUMIN 2.7* 3.0*   No results found for this basename: LIPASE:2,AMYLASE:2 in the last 72 hours No results found for this basename: AMMONIA:2 in the last 72 hours CBC:  Basename 07/19/11 0347 07/18/11 0335  WBC 19.3* 18.4*  NEUTROABS -- --  HGB 11.1* 11.3*  HCT 34.9* 35.4*  MCV 93.1 92.7  PLT 332 336   Fasting Lipid Panel:  Basename 07/18/11 0335  CHOL 192  HDL 72  LDLCALC 106*  TRIG 72  CHOLHDL 2.7  LDLDIRECT --   Thyroid Function Tests:  Basename 07/18/11 0335  TSH 0.451  T4TOTAL --  FREET4 --  T3FREE --  THYROIDAB --   Micro Results: Recent Results (from the past 240 hour(s))  CULTURE, RESPIRATORY     Status: Normal   Collection Time   07/17/11  9:33 AM      Component Value Range Status Comment   Specimen Description SPUTUM   Final    Special Requests NONE   Final    Gram Stain     Final    Value:  RARE WBC PRESENT, PREDOMINANTLY PMN     NO SQUAMOUS EPITHELIAL CELLS SEEN     ABUNDANT GRAM POSITIVE COCCI     IN PAIRS IN CLUSTERS   Culture NORMAL OROPHARYNGEAL FLORA   Final    Report Status 07/20/2011 FINAL   Final     Studies/Results: Ct Angio Chest W/cm &/or Wo Cm  07/16/2011  *RADIOLOGY REPORT*  Clinical Data:  Persistent cough, shortness of breath and wheezing.  CT ANGIOGRAPHY CHEST WITH CONTRAST  IMPRESSION:  1.  No evidence of significant pulmonary embolus. 2.  Focal patchy airspace opacity within the left lower lobe.  This is concerning for pneumonia, though given mild left hilar lymphadenopathy and the focal appearance of the opacity, malignancy could conceivably have a similar appearance.  If the patient has symptoms for pneumonia, would treat for  pneumonia, then perform follow-up chest radiograph to ensure resolution.  Otherwise, additional evaluation would be warranted.  3.  Mildly enlarged left hilar node, measuring 0.1 cm in short axis. 4.  Marked degenerative change at the right humeral head.  Original Report Authenticated By: Tonia Ghent, M.D.   Mm Digital Screening  Medications: Scheduled Meds:    . albuterol  2.5 mg Nebulization QID  . amLODipine  5 mg Oral Daily  . antiseptic oral rinse  15 mL Mouth Rinse BID  . azithromycin  250 mg Oral Daily  . dextromethorphan-guaiFENesin  2 tablet Oral BID  . diclofenac  75 mg Oral BID  . enoxaparin (LOVENOX) injection  70 mg Subcutaneous Q24H  . famotidine  20 mg Oral QHS  . levothyroxine  100 mcg Oral QAC breakfast  . loratadine  10 mg Oral Daily  . pantoprazole  40 mg Oral BID AC  . piperacillin-tazobactam (ZOSYN)  IV  3.375 g Intravenous Q8H  . potassium chloride  40 mEq Oral Once  . predniSONE  40 mg Oral Q breakfast  . simvastatin  10 mg Oral QHS  . traMADol  50 mg Oral Q6H  . DISCONTD: albuterol  2.5 mg Nebulization Q4H  . DISCONTD: albuterol  2.5 mg Nebulization Q4H  . DISCONTD: albuterol  2.5 mg Nebulization Q6H  . DISCONTD: dextromethorphan-guaiFENesin  1 tablet Oral BID  . DISCONTD: ipratropium  0.5 mg Nebulization Q6H  . DISCONTD: ipratropium  0.5 mg Nebulization Q6H  . DISCONTD: ipratropium  0.5 mg Nebulization Q6H  . DISCONTD: ipratropium  0.5 mg Nebulization Q6H  . DISCONTD: lisinopril  10 mg Oral Daily  . DISCONTD: pantoprazole  20 mg Oral Daily  . DISCONTD: pantoprazole  40 mg Oral Daily  . DISCONTD: vancomycin  1,250 mg Intravenous Q12H   Continuous Infusions:  PRN Meds:.acetaminophen, alum & mag hydroxide-simeth, benzonatate, LORazepam, ondansetron (ZOFRAN) IV, ondansetron, phenol-menthol, sodium chloride, zolpidem, DISCONTD: albuterol, DISCONTD: zolpidem  Assessment/Plan: 1. Chronic cough: Pulmonary input appreciated. They attributed to classic upper  airway cough syndrome. They recommend discontinuing ACE inhibitors (discontinued yesterday), maximize GERD therapy and outpatient followup with them. 2. Community acquired left lower lobe pneumonia: Improving. Will change to Avelox by mouth and per discussion with pulmonology, complete total 10 days course of antibiotics. Repeat chest x-ray in 4 weeks after discharge. 3. Acute respiratory failure/Dyspnea and cough secondary to pneumonia complicating underlying? Asthma versus severe GERD: Also rule out obstructive sleep apnea. Pulmonary input appreciated. Evaluate for home oxygen prior to discharge. 4. Hypokalemia: Replete and follow BMP 5. Hypertension: Uncontrolled. Increase amlodipine. 6. Hypothyroidism: Continue Synthroid 7. Morbid obesity 8. Anemia: Stable 9. Leukocytosis:? Secondary to pneumonia versus steroids.  Disposition: Possible discharge tomorrow   Lauri Till 07/20/2011, 1:26 PM

## 2011-07-21 LAB — DIFFERENTIAL
Basophils Absolute: 0 10*3/uL (ref 0.0–0.1)
Basophils Relative: 0 % (ref 0–1)
Monocytes Absolute: 1 10*3/uL (ref 0.1–1.0)
Neutro Abs: 13.9 10*3/uL — ABNORMAL HIGH (ref 1.7–7.7)
Neutrophils Relative %: 82 % — ABNORMAL HIGH (ref 43–77)

## 2011-07-21 LAB — BASIC METABOLIC PANEL
CO2: 31 mEq/L (ref 19–32)
Calcium: 9 mg/dL (ref 8.4–10.5)
GFR calc non Af Amer: 71 mL/min — ABNORMAL LOW (ref >90)
Potassium: 4.2 mEq/L (ref 3.5–5.1)
Sodium: 141 mEq/L (ref 135–145)

## 2011-07-21 LAB — CBC
MCH: 30.4 pg (ref 26.0–34.0)
Platelets: 324 10*3/uL (ref 150–400)
RBC: 3.88 MIL/uL (ref 3.87–5.11)

## 2011-07-21 LAB — MAGNESIUM: Magnesium: 1.9 mg/dL (ref 1.5–2.5)

## 2011-07-21 MED ORDER — TRAMADOL HCL 50 MG PO TABS: 50.0000 mg | ORAL_TABLET | Freq: Four times a day (QID) | ORAL | Status: AC

## 2011-07-21 MED ORDER — FAMOTIDINE 20 MG PO TABS: 20.0000 mg | ORAL_TABLET | Freq: Every day | ORAL | Status: DC

## 2011-07-21 MED ORDER — PANTOPRAZOLE SODIUM 40 MG PO TBEC: 40.0000 mg | DELAYED_RELEASE_TABLET | Freq: Two times a day (BID) | ORAL | Status: DC

## 2011-07-21 MED ORDER — MOXIFLOXACIN HCL 400 MG PO TABS: 400.0000 mg | ORAL_TABLET | Freq: Every day | ORAL | Status: AC

## 2011-07-21 MED ORDER — DICLOFENAC-MISOPROSTOL 75-0.2 MG PO TABS: 1.0000 | ORAL_TABLET | Freq: Two times a day (BID) | ORAL | Status: DC | PRN

## 2011-07-21 MED ORDER — AMLODIPINE BESYLATE 10 MG PO TABS: 10.0000 mg | ORAL_TABLET | Freq: Every day | ORAL | Status: DC

## 2011-07-21 MED ORDER — PREDNISONE 10 MG PO TABS: ORAL_TABLET | ORAL | Status: DC

## 2011-07-21 NOTE — Discharge Summary (Signed)
Discharge Summary  Rebecca Brooks MR#: [id number]  DOB:1950/09/05  Date of Admission: 07/16/2011 Date of Discharge: 07/21/2011  Patient's PCP: None at this time.  Attending Physician:Zeppelin Commisso  Consults: Pulmonology, Dr. Sandrea Hughs  Discharge Diagnoses: 1. Chronic cough secondary to upper airway cough syndrome 2. ? Community acquired pneumonia 3. Hypertension 4. ? Obstructive sleep apnea, needs outpatient evaluation. 5. Acute respiratory failure, will be on home oxygen. 6. Gastroesophageal reflux disease. 7. Hypothyroidism 8. Hyperlipidemia 9. Obesity    Brief Admitting History and Physical Patient is a 60 year old female with history of hypertension, hypothyroidism status post thyroidectomy, gastroesophageal reflux disease, obesity, remote history of smoking who has been battling cough for a long time but especially since October of 2012. She had been treated outpatient for variable diagnosis of bronchitis/flu/walking pneumonia but did not respond to outpatient treatments including antibiotics and steroids. She now presented with worsening dyspnea and cough. CT scan of the chest was suggestive of pneumonia. She also gave history of being treated for allergy secondary to molds at home. Evaluation in the emergency room revealed blood pressure of 167/91, pulse rate 136 per minute, afebrile, respiratory rate 26 per minute and saturating at 90%. She had coarse breath sounds. White blood cell count was 20,000. CT scan of the chest showed focal patchy airspace opacity within the left lower lobe concerning for pneumonia mild left hilar lymphadenopathy. She was admitted for further evaluation and management.  Discharge Medications Current Discharge Medication List    START taking these medications   Details  amLODipine (NORVASC) 10 MG tablet Take 1 tablet (10 mg total) by mouth daily. Qty: 30 tablet, Refills: 0    famotidine (PEPCID) 20 MG tablet Take 1 tablet (20 mg total) by  mouth at bedtime. Qty: 30 tablet, Refills: 0    moxifloxacin (AVELOX) 400 MG tablet Take 1 tablet (400 mg total) by mouth daily at 6 PM. Qty: 5 tablet, Refills: 0    predniSONE (DELTASONE) 10 MG tablet Take 4 tablets daily for 3 days, then 3 tablets daily for 3 days, then 2 tablets daily for 3 days, then 1 tablet daily for 3 days, and then stop. Qty: 30 tablet, Refills: 0    traMADol (ULTRAM) 50 MG tablet Take 1 tablet (50 mg total) by mouth every 6 (six) hours. Maximum dose= 8 tablets per day Take as scheduled until cough subsides, then can take 1 tablet every 6 hours as needed for cough. Qty: 30 tablet, Refills: 0      CONTINUE these medications which have CHANGED   Details  diclofenac-misoprostol (ARTHROTEC 75) 75-200 MG-MCG per tablet Take 1 tablet by mouth 2 (two) times daily as needed (pain.).    pantoprazole (PROTONIX) 40 MG tablet Take 1 tablet (40 mg total) by mouth 2 (two) times daily before a meal. Qty: 60 tablet, Refills: 0      CONTINUE these medications which have NOT CHANGED   Details  acetaminophen (TYLENOL) 325 MG tablet Take 650 mg by mouth every 6 (six) hours as needed. As needed for pain     albuterol (PROVENTIL) (2.5 MG/3ML) 0.083% nebulizer solution Take 2.5 mg by nebulization every 6 (six) hours as needed. For breathing    dextromethorphan-guaiFENesin (MUCINEX DM) 30-600 MG per 12 hr tablet Take 1 tablet by mouth every 12 (twelve) hours.      levothyroxine (SYNTHROID, LEVOTHROID) 100 MCG tablet Take 100 mcg by mouth daily.      loratadine (CLARITIN) 10 MG tablet Take 10 mg by mouth daily.  simvastatin (ZOCOR) 10 MG tablet Take 10 mg by mouth at bedtime.      Vitamin D, Ergocalciferol, (DRISDOL) 50000 UNITS CAPS Take 50,000 Units by mouth.        STOP taking these medications     quinapril (ACCUPRIL) 10 MG tablet Comments:  Reason for Stopping:          Hospital Course: 1. Chronic cough secondary to upper airway cough syndrome: Patient was  admitted to the hospital. She was empirically started on broad-spectrum intravenous antibiotics, steroids and was continued on her home dose of Protonix. However because she has had this cough for a long time and the etiology was not fully clear, pulmonology was consulted. They indicated that she had the classic upper airway cough syndrome the differential diagnosis for which could be gastroesophageal reflux disease versus secondary to ACE inhibitors versus sinusitis with postnasal drip. Dr. Sherene Sires recommended completing total 10 days course of antibiotics, maximize treatment for gastroesophageal reflux disease, discontinuing ACE inhibitor should and using tramadol for cough. He recommended outpatient followup with him in 4 weeks from hospital discharge. Since the ACE inhibitors were stopped days ago patient indicates that her cough is 70% better.  2. Community acquired left lower lobe pneumonia: According to primary M.D. the CT scan is very underwhelming. They recommend completing total 10 days course of antibiotics (Avelox) and repeat CT scan of her chest in 4 weeks rather than a chest x-ray because the chest x-ray was normal. 3. Acute hypoxic respiratory failure: Secondary to problem #1 and 2. Rule out asthma versus COPD and obstructive sleep apnea as an outpatient. Patient will complete a prednisone taper and will go home on oxygen. She hasn't albuterol nebulizer at home. Home oxygen is being arranged. 4. Hypertension: ACE inhibitors have been stopped and should not BE used. Continue amlodipine. 5. Hypokalemia: Repleted 6. Leukocytosis: Possibly secondary to steroids, improving 7. Anemia: Outpatient followup   Day of Discharge BP 138/89  Pulse 110  Temp(Src) 98 F (36.7 C) (Oral)  Resp 20  Ht 5\' 3"  (1.6 m)  Wt 140.116 kg (308 lb 14.4 oz)  BMI 54.72 kg/m2  SpO2 95%  General exam: Morbidly obese female patient was in no obvious distress and lying supine in bed. Slightly tearful because she was  told that she will be going home on oxygen. Respiratory system: Clear. No increased work of breathing.  Cardiovascular system: First and second heart sounds heard, regular. No JVD or murmur.  Gastrointestinal system: Abdomen is nondistended, soft and normal bowel sounds heard.  Central nervous system: Alert and oriented. No focal neurological deficits.   Results for orders placed during the hospital encounter of 07/16/11 (from the past 48 hour(s))  BASIC METABOLIC PANEL     Status: Abnormal   Collection Time   07/20/11  3:05 AM      Component Value Range Comment   Sodium 142  135 - 145 (mEq/L)    Potassium 3.4 (*) 3.5 - 5.1 (mEq/L)    Chloride 105  96 - 112 (mEq/L)    CO2 30  19 - 32 (mEq/L)    Glucose, Bld 107 (*) 70 - 99 (mg/dL)    BUN 18  6 - 23 (mg/dL)    Creatinine, Ser 1.61  0.50 - 1.10 (mg/dL)    Calcium 8.9  8.4 - 10.5 (mg/dL)    GFR calc non Af Amer 68 (*) >90 (mL/min)    GFR calc Af Amer 78 (*) >90 (mL/min)   CBC  Status: Abnormal   Collection Time   07/21/11  3:30 AM      Component Value Range Comment   WBC 16.8 (*) 4.0 - 10.5 (K/uL)    RBC 3.88  3.87 - 5.11 (MIL/uL)    Hemoglobin 11.8 (*) 12.0 - 15.0 (g/dL)    HCT 78.2  95.6 - 21.3 (%)    MCV 93.0  78.0 - 100.0 (fL)    MCH 30.4  26.0 - 34.0 (pg)    MCHC 32.7  30.0 - 36.0 (g/dL)    RDW 08.6  57.8 - 46.9 (%)    Platelets 324  150 - 400 (K/uL)   BASIC METABOLIC PANEL     Status: Abnormal   Collection Time   07/21/11  3:30 AM      Component Value Range Comment   Sodium 141  135 - 145 (mEq/L)    Potassium 4.2  3.5 - 5.1 (mEq/L)    Chloride 104  96 - 112 (mEq/L)    CO2 31  19 - 32 (mEq/L)    Glucose, Bld 98  70 - 99 (mg/dL)    BUN 14  6 - 23 (mg/dL)    Creatinine, Ser 6.29  0.50 - 1.10 (mg/dL)    Calcium 9.0  8.4 - 10.5 (mg/dL)    GFR calc non Af Amer 71 (*) >90 (mL/min)    GFR calc Af Amer 83 (*) >90 (mL/min)   MAGNESIUM     Status: Normal   Collection Time   07/21/11  3:30 AM      Component Value Range  Comment   Magnesium 1.9  1.5 - 2.5 (mg/dL)   DIFFERENTIAL     Status: Abnormal   Collection Time   07/21/11  3:30 AM      Component Value Range Comment   Neutrophils Relative 82 (*) 43 - 77 (%)    Neutro Abs 13.9 (*) 1.7 - 7.7 (K/uL)    Lymphocytes Relative 11 (*) 12 - 46 (%)    Lymphs Abs 1.8  0.7 - 4.0 (K/uL)    Monocytes Relative 6  3 - 12 (%)    Monocytes Absolute 1.0  0.1 - 1.0 (K/uL)    Eosinophils Relative 1  0 - 5 (%)    Eosinophils Absolute 0.1  0.0 - 0.7 (K/uL)    Basophils Relative 0  0 - 1 (%)    Basophils Absolute 0.0  0.0 - 0.1 (K/uL)    1. TSH 0.451 2. Lipid panel significant for LDL 106, VLDL 14, HDL 72, triglycerides 72 and cholesterol 192 3. Sputum culture showed normal oropharyngeal flora 4. ProBNP was 165 5. Cardiac panel was negative 6. 2-D echocardiogram: Left ventricle cavity size was normal. Left frontal ejection fraction was 60-65%.  Ct Angio Chest W/cm &/or Wo Cm  07/16/2011  *RADIOLOGY REPORT*  Clinical Data:  Persistent cough, shortness of breath and wheezing.  CT ANGIOGRAPHY CHEST WITH CONTRAST  IMPRESSION:  1.  No evidence of significant pulmonary embolus. 2.  Focal patchy airspace opacity within the left lower lobe.  This is concerning for pneumonia, though given mild left hilar lymphadenopathy and the focal appearance of the opacity, malignancy could conceivably have a similar appearance.  If the patient has symptoms for pneumonia, would treat for pneumonia, then perform follow-up chest radiograph to ensure resolution.  Otherwise, additional evaluation would be warranted.  3.  Mildly enlarged left hilar node, measuring 0.1 cm in short axis. 4.  Marked degenerative change at the right humeral  head.  Original Report Authenticated By: Tonia Ghent, M.D.      Disposition: Discharged home in stable condition  Diet: Heart healthy  Activity: Increase activity gradually  Follow-up Appts: Discharge Orders    Future Orders Please Complete By Expires    Diet - low sodium heart healthy      Increase activity slowly      Call MD for:  difficulty breathing, headache or visual disturbances         TESTS THAT NEED FOLLOW-UP CT scan of the chest in 4 weeks from hospital discharge.  Time spent on discharge, talking to the patient, and coordinating care: 45 mins.   SignedMarcellus Scott, MD 07/21/2011, 3:56 PM

## 2011-07-21 NOTE — Plan of Care (Signed)
Problem: Discharge Progression Outcomes Goal: O2 sats > or equal 90% or at baseline Outcome: Not Progressing Pt will require home oxygen for discharge

## 2011-07-21 NOTE — Progress Notes (Signed)
UR completed 

## 2011-07-21 NOTE — Progress Notes (Signed)
Patient's oxygen saturations checked on room air.  Patient was 92% resting on room air.  Patient desated to 86% on room air with ambulation in the hallway.  Oxygen reapplied at 2L [place].  Allayne Butcher [facility] 07/21/2011

## 2011-07-21 NOTE — Progress Notes (Signed)
Ambulated patient in the hallway while on oxygen at 2L [place].  Patient's saturation dropped to 91%.  Oxygen was increased to 3L [place] and patient's saturation increased and maintained at 96-97%.  Allayne Butcher [facility]  07/21/2011  4:56 PM

## 2011-07-21 NOTE — Progress Notes (Signed)
Received a referral regarding need for home O2, patient has qualifying sats. Spoke with patient and husband at bedside, patient is agreeable to O2 with [facility]. Contacted Kristen with [facility] to arrange. Patient will need a travel tank at d/c. Patient also concerned about obtaining PCP, states may use Dr. Tawanna Cooler who is husband's PCP, also provided # to [facility] to assist. Will continue to follow for d/c needs.

## 2011-07-21 NOTE — Progress Notes (Signed)
Patient and her husband given discharge instructions and they verbalized understanding.  Patient stable for discharge home.  Patient switched to home oxygen tank and demonstrated appropriately the use of the tank.  Remaining home oxygen supplies will be delivered to patient's home tonight.  Patient understands when to notify MD.  Follow up appointments discussed with patient.  Allayne Butcher [facility]  07/21/2011  6:23 PM

## 2011-07-21 NOTE — Progress Notes (Signed)
Physical Therapy Treatment Patient Details Name: Rebecca Brooks MRN: [id number] DOB: 06-22-1951 Today's Date: 07/21/2011 Time: 1345-1415  1G, 1 IPC PT Assessment/Plan  PT - Assessment/Plan Comments on Treatment Session: Pt performed well with session. Practiced stair negotiation this session. Still note some dyspnea 2/4 with activity. Pt reports fatigue level 2 (0-5 scale) with activity today. Discussed follow-up recommendations: pulmonary rehab after discharge. Also discussed OP PT follow-up after pulmonary rehab. Pt expressed some concerns about R hip/THR-c/o some numbness noticed after standing for 30 minutes or so, and some weakness. Recommended follou-up with PCP and/or ortho MD if necessary.  PT Plan: Discharge plan remains appropriate Follow Up Recommendations:  (pulmonary rehab after discharge?)-previous therapist gave pt contact info. Equipment Recommended: None recommended by PT PT Goals  Acute Rehab PT Goals PT Goal: Ambulate - Progress: Progressing toward goal PT Goal: Up/Down Stairs - Progress: Progressing toward goal  PT Treatment Precautions/Restrictions  Restrictions Weight Bearing Restrictions: No Mobility (including Balance) Bed Mobility Bed Mobility: No Transfers Transfers: Yes Sit to Stand: 7: Independent Stand to Sit: 7: Independent Ambulation/Gait Ambulation/Gait: Yes Ambulation/Gait Assistance: 6: Modified independent (Device/Increase time) Ambulation/Gait Assistance Details (indicate cue type and reason): Increased lateral sway/translation during gait, but feel this is/was likely occurring at baseline. No LOB with activity. O2 sats after ambulation on RA were 95%, HR 120 bpm Ambulation Distance (Feet): 200 Feet Assistive device: None Stairs: Yes Stairs Assistance: 5: Supervision Stairs Assistance Details (indicate cue type and reason): VCs safety. Pt able to use reciprocal pattern while ascending stairs, and step-to pattern while descending stairs. Pt c/o  some LE weakness during stair negotiation. Standing rest break at top of stairs. O2 sats 95% on 2 L O2 after up and down stairs. Stair Management Technique: One rail Right Number of Stairs: 8     Exercise  Other Exercises Other Exercises: Assessed pt's LE strength in sitting: R LE Strength 4+/5 throughout. L LE 4/5 throughout.  End of Session PT - End of Session Equipment Utilized During Treatment: Gait belt Activity Tolerance: Patient tolerated treatment well Patient left: in chair;with call bell in reach General Behavior During Session: [facility] for tasks performed Cognition: Rebecca Brooks for tasks performed  Rebecca Brooks Alert Rebecca Brooks 07/21/2011, 3:11 PM [phone]

## 2011-08-01 ENCOUNTER — Encounter: Payer: Self-pay | Admitting: Family Medicine

## 2011-08-01 ENCOUNTER — Ambulatory Visit (INDEPENDENT_AMBULATORY_CARE_PROVIDER_SITE_OTHER): Payer: PRIVATE HEALTH INSURANCE | Admitting: Family Medicine

## 2011-08-01 VITALS — BP 137/87 | HR 92 | Temp 97.4°F | Ht 63.0 in | Wt 313.0 lb

## 2011-08-01 DIAGNOSIS — E039 Hypothyroidism, unspecified: Secondary | ICD-10-CM

## 2011-08-01 DIAGNOSIS — I10 Essential (primary) hypertension: Secondary | ICD-10-CM

## 2011-08-01 DIAGNOSIS — Z23 Encounter for immunization: Secondary | ICD-10-CM

## 2011-08-01 DIAGNOSIS — D649 Anemia, unspecified: Secondary | ICD-10-CM | POA: Insufficient documentation

## 2011-08-01 DIAGNOSIS — E669 Obesity, unspecified: Secondary | ICD-10-CM

## 2011-08-01 DIAGNOSIS — D72829 Elevated white blood cell count, unspecified: Secondary | ICD-10-CM | POA: Insufficient documentation

## 2011-08-01 DIAGNOSIS — E876 Hypokalemia: Secondary | ICD-10-CM | POA: Insufficient documentation

## 2011-08-01 DIAGNOSIS — M545 Low back pain, unspecified: Secondary | ICD-10-CM

## 2011-08-01 DIAGNOSIS — M129 Arthropathy, unspecified: Secondary | ICD-10-CM

## 2011-08-01 DIAGNOSIS — K219 Gastro-esophageal reflux disease without esophagitis: Secondary | ICD-10-CM | POA: Insufficient documentation

## 2011-08-01 DIAGNOSIS — B372 Candidiasis of skin and nail: Secondary | ICD-10-CM

## 2011-08-01 DIAGNOSIS — M1990 Unspecified osteoarthritis, unspecified site: Secondary | ICD-10-CM | POA: Insufficient documentation

## 2011-08-01 DIAGNOSIS — R002 Palpitations: Secondary | ICD-10-CM | POA: Insufficient documentation

## 2011-08-01 DIAGNOSIS — J189 Pneumonia, unspecified organism: Secondary | ICD-10-CM

## 2011-08-01 DIAGNOSIS — M79604 Pain in right leg: Secondary | ICD-10-CM | POA: Insufficient documentation

## 2011-08-01 LAB — CBC
Hemoglobin: 13.4 g/dL (ref 12.0–15.0)
MCHC: 33 g/dL (ref 30.0–36.0)
MCV: 93.4 fl (ref 78.0–100.0)
RDW: 14.7 % — ABNORMAL HIGH (ref 11.5–14.6)

## 2011-08-01 LAB — RENAL FUNCTION PANEL
BUN: 14 mg/dL (ref 6–23)
Chloride: 105 mEq/L (ref 96–112)
GFR: 73.24 mL/min (ref >60.00)
Glucose, Bld: 112 mg/dL — ABNORMAL HIGH (ref 70–99)
Phosphorus: 2.9 mg/dL (ref 2.3–4.6)

## 2011-08-01 NOTE — Patient Instructions (Addendum)

## 2011-08-04 MED ORDER — PANTOPRAZOLE SODIUM 40 MG PO TBEC: 40.0000 mg | DELAYED_RELEASE_TABLET | Freq: Two times a day (BID) | ORAL | Status: DC

## 2011-08-04 MED ORDER — CEFDINIR 300 MG PO CAPS: 300.0000 mg | ORAL_CAPSULE | Freq: Two times a day (BID) | ORAL | Status: DC

## 2011-08-06 NOTE — Progress Notes (Signed)
[name], female   DOB: 03/20/1951, 61 y.o.   MRN: [id number] Rebecca Brooks [id number] 20-May-1951 08/06/2011      Progress Note New Patient  Subjective  Chief Complaint  Chief Complaint  Patient presents with  . Establish Care    new patient    HPI  Patient is a 61 year old Caucasian female who is in today for the patient she was very sick since October. With recurrent bronchitis and pneumonia. Has been treated with antibiotics several times including the most recent course of Avelox. Was hospitalized at [facility] long this past month. Was sent home on antibiotics which she finished on the first of this month and a steroid taper which is almost done. No fevers chills at this time but she has had them frequently. She does have postnasal drip and some persistent anorexia. Her cough is ofte she is still using 1 L of oxygen at 3 L with exercise. She also complains of chronic back pain and radicular symptoms and uses Aleve once to twice daily with some decent effects. She had some atypical chest pain and ultimately was diagnosed with reflux she reports her last stress test was over 10 years ago. She had an echo which did not show any significant disease. Her last mammogram was in November of 2012 and was normal[facility] Increased fatigue today. Has had some skin rash under her breasts and miconazole powder seems to be helping. Last colonoscopy 2007. Distant history of abnormal Pap in 1979 but none since then. No other acute complaints offered today. She does continue to have trouble sleeping due to shortness of breath and has to sleep on several pillows. Prior to hospitalization she was tachycardia and shortness of breath but that is improving. n still productive of green sputum. No significant runny nose. The cough is improved but not fully gone.  Past Medical History  Diagnosis Date  . Pneumonia     this October from which she has said inhailer  . Obesity   . Chicken  pox as a child  . Measles as a child  . Mumps as a child  . Arthritis   . GERD (gastroesophageal reflux disease)   . Hyperlipidemia   . Hypertension   . Hypothyroidism   . Palpitations 08/01/2011  . Anemia 08/01/2011  . Hypokalemia 08/01/2011  . Leukocytosis 08/01/2011  . Low back pain radiating to right leg     intermittent and occasional numbness of skin over right hip in certain positions    Past Surgical History  Procedure Date  . Thyroidectomy     total for benign tumor, Parathyroid spared  . Tonsillectomy   . Cesarean section     X 3  . Joint replacement 2006    right hip replacement, secondary to congenital  hip defect  . Hernia repair 61 yrs old    right inguinal    Family History  Problem Relation Age of Onset  . Cancer Mother 31    lung/ smoker  . Heart disease Mother   . Hearing loss Mother     tachycardia  . Mental illness Mother     anxiety, claustrophobia  . Osteoarthritis Father   . Hyperlipidemia Father   . Hyperlipidemia Sister   . Migraines Sister   . Hypertension Sister   . Cancer Maternal Grandmother     colon  . Heart attack Maternal Grandfather   . Aneurysm Paternal Grandfather     abdominal  . Heart  disease Paternal Grandfather     AAA rupture, smoker    History   Social History  . Marital Status: Married    Spouse Name: N/A    Number of Children: N/A  . Years of Education: N/A   Occupational History  . Not on file.   Social History Main Topics  . Smoking status: Former Games developer  . Smokeless tobacco: Never Used   Comment: quit smoking 40 yrs ago  . Alcohol Use: 8.4 oz/week    14 Glasses of wine per week  . Drug Use: No  . Sexually Active: No   Other Topics Concern  . Not on file   Social History Narrative  . No narrative on file    Current Outpatient Prescriptions on File Prior to Visit  Medication Sig Dispense Refill  . acetaminophen (TYLENOL) 325 MG tablet Take 650 mg by mouth every 6 (six) hours as needed. As needed for  pain       . albuterol (PROVENTIL) (2.5 MG/3ML) 0.083% nebulizer solution Take 2.5 mg by nebulization every 6 (six) hours as needed. For breathing      . amLODipine (NORVASC) 10 MG tablet Take 1 tablet (10 mg total) by mouth daily.  30 tablet  0  . famotidine (PEPCID) 20 MG tablet Take 1 tablet (20 mg total) by mouth at bedtime.  30 tablet  0  . levothyroxine (SYNTHROID, LEVOTHROID) 100 MCG tablet Take 100 mcg by mouth daily.        Marland Kitchen loratadine (CLARITIN) 10 MG tablet Take 10 mg by mouth daily.        . predniSONE (DELTASONE) 10 MG tablet Take 4 tablets daily for 3 days, then 3 tablets daily for 3 days, then 2 tablets daily for 3 days, then 1 tablet daily for 3 days, and then stop.  30 tablet  0  . simvastatin (ZOCOR) 10 MG tablet Take 10 mg by mouth at bedtime.        . Vitamin D, Ergocalciferol, (DRISDOL) 50000 UNITS CAPS Take 50,000 Units by mouth.        . diclofenac-misoprostol (ARTHROTEC 75) 75-200 MG-MCG per tablet Take 1 tablet by mouth 2 (two) times daily as needed (pain.).        No Known Allergies  Review of Systems  Review of Systems  Constitutional: Positive for fever, chills and malaise/fatigue.  HENT: Positive for congestion and sore throat. Negative for hearing loss and nosebleeds.   Eyes: Negative for discharge.  Respiratory: Positive for cough, sputum production, shortness of breath and wheezing.   Cardiovascular: Positive for chest pain. Negative for palpitations and leg swelling.  Gastrointestinal: Negative for heartburn, nausea, vomiting, abdominal pain, diarrhea, constipation and blood in stool.  Genitourinary: Negative for dysuria, urgency, frequency and hematuria.  Musculoskeletal: Negative for myalgias, back pain and falls.  Skin: Negative for rash.  Neurological: Negative for dizziness, tremors, sensory change, focal weakness, loss of consciousness, weakness and headaches.  Endo/Heme/Allergies: Negative for polydipsia. Does not bruise/bleed easily.    Psychiatric/Behavioral: Negative for depression and suicidal ideas. The patient is not nervous/anxious and does not have insomnia.     Objective  BP 137/87  Pulse 92  Temp(Src) 97.4 F (36.3 C) (Temporal)  Ht 5\' 3"  (1.6 m)  Wt 313 lb (141.976 kg)  BMI 55.45 kg/m2  SpO2 92%  Physical Exam  Physical Exam  Constitutional: She is oriented to person, place, and time and well-developed, well-nourished, and in no distress. No distress.  HENT:  Head: Normocephalic  and atraumatic.  Right Ear: External ear normal.  Left Ear: External ear normal.  Nose: Nose normal.  Mouth/Throat: Oropharynx is clear and moist. No oropharyngeal exudate.  Eyes: Conjunctivae are normal. Pupils are equal, round, and reactive to light. Right eye exhibits no discharge. Left eye exhibits no discharge. No scleral icterus.  Neck: Normal range of motion. Neck supple. No thyromegaly present.  Cardiovascular: Normal rate, regular rhythm, normal heart sounds and intact distal pulses.   No murmur heard. Pulmonary/Chest: Effort normal. No respiratory distress. She has wheezes. She has no rales.       Mild, expiratory wheeze throughout  Abdominal: Soft. Bowel sounds are normal. She exhibits no distension and no mass. There is no tenderness.  Musculoskeletal: Normal range of motion. She exhibits no edema and no tenderness.  Lymphadenopathy:    She has no cervical adenopathy.  Neurological: She is alert and oriented to person, place, and time. She has normal reflexes. No cranial nerve deficit. Coordination normal.  Skin: Skin is warm and dry. No rash noted. She is not diaphoretic.  Psychiatric: Mood, memory and affect normal.       Assessment & Plan   Pneumonia Has been sick since October. Has recently been hospitalized severe symptoms. Records steroids, antibiotics, oxygen. She is feeling significantly better since her lesion the hospital. Is on a very low dose of steroid at this time after being tapered down.  Is no longer on an antibiotic. She has followup scheduled with pulmonology in the not too distant future. And her lung exam has wheezing today. She is encouraged to continue using her oxygen as prescribed. And she is considering pulmonary rehabilitation. We will start Omnicef 300 twice a day and see her in followup. Maintain inhalers. Report worsening symptoms.  Obesity (BMI 30-39.9) Encouraged DASH diet and increased activity as tolerated  Leukocytosis Higher than expected even with steroids will restart antibiotics today  Hypertension Elevated today but patient acutely ill and on steroids, avoid all decongestants, minimize sodium and we will recheck at next visit.  GERD (gastroesophageal reflux disease) Avoid offending foods and continue Pantoprazole and Famotidine  Candidal skin infection Under breasts, improving with Miconazole may continue this powder and add a probiotic  Palpitations No significant symptoms at this time  Low back pain radiating to right leg Long h/o no flare at this time.

## 2011-08-08 ENCOUNTER — Ambulatory Visit (HOSPITAL_BASED_OUTPATIENT_CLINIC_OR_DEPARTMENT_OTHER)
Admission: RE | Admit: 2011-08-08 | Discharge: 2011-08-08 | Disposition: A | Discharge status: Home / Self Care | Payer: PRIVATE HEALTH INSURANCE | Source: Ambulatory Visit | Attending: Family Medicine | Admitting: Family Medicine

## 2011-08-08 ENCOUNTER — Encounter: Payer: Self-pay | Admitting: Family Medicine

## 2011-08-08 ENCOUNTER — Ambulatory Visit (INDEPENDENT_AMBULATORY_CARE_PROVIDER_SITE_OTHER): Payer: PRIVATE HEALTH INSURANCE | Admitting: Family Medicine

## 2011-08-08 VITALS — BP 93/65 | HR 100 | Temp 97.9°F | Ht 63.0 in | Wt 312.0 lb

## 2011-08-08 DIAGNOSIS — J189 Pneumonia, unspecified organism: Secondary | ICD-10-CM

## 2011-08-08 DIAGNOSIS — D72829 Elevated white blood cell count, unspecified: Secondary | ICD-10-CM

## 2011-08-08 DIAGNOSIS — R062 Wheezing: Secondary | ICD-10-CM

## 2011-08-08 DIAGNOSIS — E876 Hypokalemia: Secondary | ICD-10-CM

## 2011-08-08 DIAGNOSIS — I10 Essential (primary) hypertension: Secondary | ICD-10-CM

## 2011-08-08 LAB — CBC
MCHC: 33.8 g/dL (ref 30.0–36.0)
Platelets: 355 10*3/uL (ref 150.0–400.0)
RDW: 14.2 % (ref 11.5–14.6)

## 2011-08-08 LAB — RENAL FUNCTION PANEL
Creatinine, Ser: 0.9 mg/dL (ref 0.4–1.2)
GFR: 70.33 mL/min (ref >60.00)
Glucose, Bld: 107 mg/dL — ABNORMAL HIGH (ref 70–99)
Phosphorus: 3.2 mg/dL (ref 2.3–4.6)
Potassium: 4.9 mEq/L (ref 3.5–5.1)
Sodium: 142 mEq/L (ref 135–145)

## 2011-08-08 LAB — SEDIMENTATION RATE: Sed Rate: 70 mm/hr — ABNORMAL HIGH (ref 0–22)

## 2011-08-08 MED ORDER — FLUTICASONE-SALMETEROL 250-50 MCG/DOSE IN AEPB: 1.0000 | INHALATION_SPRAY | Freq: Two times a day (BID) | RESPIRATORY_TRACT | Status: DC

## 2011-08-08 MED ORDER — PREDNISONE 10 MG PO TABS: ORAL_TABLET | ORAL | Status: DC

## 2011-08-08 NOTE — Progress Notes (Signed)
[name], female   DOB: 1951-02-01, 61 y.o.   MRN: [id number] Rebecca Brooks [id number] May 09, 1951 08/08/2011      Progress Note-Follow Up  Subjective  Chief Complaint  Chief Complaint  Patient presents with  . Wheezing    started again over the weekend- used inhaler at 6 this morning and seemed to help    HPI  Patient is a 61 year old Caucasian female who is in today for evaluation of recurrent cough. She was seen and discussed with the first time as an inpatient and was feeling much better but this past weekend when her steroids ended her cough worsened again. She was wheezing last night to the point where she had trouble sleeping symptoms were worse she was on the left side. She denies any GI symptoms or heartburn. She is noting worsening cough and shortness of breath. No fevers, chills some mild nasal congestion is noted. No rhinorrhea, no sore throat, no ear pain, no headache. No palpitations.  Past Medical History  Diagnosis Date  . Pneumonia     this October from which she has said inhailer  . Obesity   . Chicken pox as a child  . Measles as a child  . Mumps as a child  . Arthritis   . GERD (gastroesophageal reflux disease)   . Hyperlipidemia   . Hypertension   . Hypothyroidism   . Palpitations 08/01/2011  . Anemia 08/01/2011  . Hypokalemia 08/01/2011  . Leukocytosis 08/01/2011  . Low back pain radiating to right leg     intermittent and occasional numbness of skin over right hip in certain positions    Past Surgical History  Procedure Date  . Thyroidectomy     total for benign tumor, Parathyroid spared  . Tonsillectomy   . Cesarean section     X 3  . Joint replacement 2006    right hip replacement, secondary to congenital  hip defect  . Hernia repair 61 yrs old    right inguinal    Family History  Problem Relation Age of Onset  . Cancer Mother 60    lung/ smoker  . Heart disease Mother   . Hearing loss Mother     tachycardia  . Mental illness  Mother     anxiety, claustrophobia  . Osteoarthritis Father   . Hyperlipidemia Father   . Hyperlipidemia Sister   . Migraines Sister   . Hypertension Sister   . Cancer Maternal Grandmother     colon  . Heart attack Maternal Grandfather   . Aneurysm Paternal Grandfather     abdominal  . Heart disease Paternal Grandfather     AAA rupture, smoker    History   Social History  . Marital Status: Married    Spouse Name: N/A    Number of Children: N/A  . Years of Education: N/A   Occupational History  . Not on file.   Social History Main Topics  . Smoking status: Former Games developer  . Smokeless tobacco: Never Used   Comment: quit smoking 40 yrs ago  . Alcohol Use: 8.4 oz/week    14 Glasses of wine per week  . Drug Use: No  . Sexually Active: No   Other Topics Concern  . Not on file   Social History Narrative  . No narrative on file    Current Outpatient Prescriptions on File Prior to Visit  Medication Sig Dispense Refill  . acetaminophen (TYLENOL) 325 MG tablet Take 650 mg by mouth  every 6 (six) hours as needed. As needed for pain       . albuterol (PROVENTIL) (2.5 MG/3ML) 0.083% nebulizer solution Take 2.5 mg by nebulization every 6 (six) hours as needed. For breathing      . amLODipine (NORVASC) 10 MG tablet Take 1 tablet (10 mg total) by mouth daily.  30 tablet  0  . cefdinir (OMNICEF) 300 MG capsule Take 1 capsule (300 mg total) by mouth 2 (two) times daily.  20 capsule  0  . diclofenac-misoprostol (ARTHROTEC 75) 75-200 MG-MCG per tablet Take 1 tablet by mouth 2 (two) times daily as needed (pain.).      Marland Kitchen famotidine (PEPCID) 20 MG tablet Take 1 tablet (20 mg total) by mouth at bedtime.  30 tablet  0  . levothyroxine (SYNTHROID, LEVOTHROID) 100 MCG tablet Take 100 mcg by mouth daily.        Marland Kitchen loratadine (CLARITIN) 10 MG tablet Take 10 mg by mouth daily.        . pantoprazole (PROTONIX) 40 MG tablet Take 1 tablet (40 mg total) by mouth 2 (two) times daily before a meal.  60  tablet  0  . simvastatin (ZOCOR) 10 MG tablet Take 10 mg by mouth at bedtime.        . Vitamin D, Ergocalciferol, (DRISDOL) 50000 UNITS CAPS Take 50,000 Units by mouth.          No Known Allergies  Review of Systems  Review of Systems  Constitutional: Positive for fever and malaise/fatigue.  HENT: Positive for congestion.   Eyes: Negative for discharge.  Respiratory: Positive for cough, sputum production, shortness of breath and wheezing.   Cardiovascular: Negative for chest pain, palpitations and leg swelling.  Gastrointestinal: Negative for nausea, abdominal pain and diarrhea.  Genitourinary: Negative for dysuria.  Musculoskeletal: Negative for falls.  Skin: Negative for rash.  Neurological: Negative for loss of consciousness and headaches.  Endo/Heme/Allergies: Negative for polydipsia.  Psychiatric/Behavioral: Negative for depression and suicidal ideas. The patient is not nervous/anxious and does not have insomnia.     Objective  BP 93/65  Pulse 100  Temp(Src) 97.9 F (36.6 C) (Temporal)  Ht 5\' 3"  (1.6 m)  Wt 312 lb (141.522 kg)  BMI 55.27 kg/m2  SpO2 93%  Physical Exam  Physical Exam  Constitutional: She is oriented to person, place, and time and well-developed, well-nourished, and in no distress. No distress.  HENT:  Head: Normocephalic and atraumatic.  Eyes: Conjunctivae are normal.  Neck: Neck supple. No thyromegaly present.  Cardiovascular: Normal rate, regular rhythm and normal heart sounds.   No murmur heard. Pulmonary/Chest: Effort normal. She has wheezes.       Wheezing throughout all lung fields  Abdominal: She exhibits no distension and no mass.  Musculoskeletal: She exhibits no edema.  Lymphadenopathy:    She has no cervical adenopathy.  Neurological: She is alert and oriented to person, place, and time.  Skin: Skin is warm and dry. No rash noted. She is not diaphoretic.  Psychiatric: Memory, affect and judgment normal.    Lab Results  Component  Value Date   TSH 0.451 07/18/2011   Lab Results  Component Value Date   WBC 25.6* 08/01/2011   HGB 13.4 08/01/2011   HCT 40.6 08/01/2011   MCV 93.4 08/01/2011   PLT 473.0* 08/01/2011   Lab Results  Component Value Date   CREATININE 0.8 08/01/2011   BUN 14 08/01/2011   NA 145 08/01/2011   K 5.0 08/01/2011  CL 105 08/01/2011   CO2 28 08/01/2011   Lab Results  Component Value Date   ALT 14 07/19/2011   AST 10 07/19/2011   ALKPHOS 78 07/19/2011   BILITOT 0.2* 07/19/2011   Lab Results  Component Value Date   CHOL 192 07/18/2011   Lab Results  Component Value Date   HDL 72 07/18/2011   Lab Results  Component Value Date   LDLCALC 106* 07/18/2011   Lab Results  Component Value Date   TRIG 72 07/18/2011   Lab Results  Component Value Date   CHOLHDL 2.7 07/18/2011     Assessment & Plan  Pneumonia Wheezing and SOB worsening again over past 2 days since steroids stopped. She is restarted on a longer steroid taper and given a course of Omnicef 300mg  po bid and she will continue her inhalers. We will see hr in follow up next week to assess for improvment  Hypertension Adequately controlled but patient noted to be mildly dehydrated encouraged to increase her po fluid intake

## 2011-08-08 NOTE — Patient Instructions (Signed)

## 2011-08-09 ENCOUNTER — Telehealth: Payer: Self-pay

## 2011-08-11 ENCOUNTER — Encounter: Payer: Self-pay | Admitting: Family Medicine

## 2011-08-11 DIAGNOSIS — B372 Candidiasis of skin and nail: Secondary | ICD-10-CM | POA: Insufficient documentation

## 2011-08-11 MED ORDER — MICONAZOLE NITRATE 2 % EX AERP: INHALATION_SPRAY | CUTANEOUS | Status: DC

## 2011-08-11 NOTE — Assessment & Plan Note (Signed)
Higher than expected even with steroids will restart antibiotics today

## 2011-08-11 NOTE — Assessment & Plan Note (Signed)
Under breasts, improving with Miconazole may continue this powder and add a probiotic

## 2011-08-11 NOTE — Assessment & Plan Note (Signed)
No significant symptoms at this time

## 2011-08-11 NOTE — Assessment & Plan Note (Signed)
Has been sick since October. Has recently been hospitalized severe symptoms. Records steroids, antibiotics, oxygen. She is feeling significantly better since her lesion the hospital. Is on a very low dose of steroid at this time after being tapered down. Is no longer on an antibiotic. She has followup scheduled with pulmonology in the not too distant future. And her lung exam has wheezing today. She is encouraged to continue using her oxygen as prescribed. And she is considering pulmonary rehabilitation. We will start Omnicef 300 twice a day and see her in followup. Maintain inhalers. Report worsening symptoms.

## 2011-08-11 NOTE — Assessment & Plan Note (Signed)
Encouraged DASH diet and increased activity as tolerated 

## 2011-08-11 NOTE — Assessment & Plan Note (Signed)
Long h/o no flare at this time.

## 2011-08-11 NOTE — Assessment & Plan Note (Signed)
Avoid offending foods and continue Pantoprazole and Famotidine

## 2011-08-11 NOTE — Assessment & Plan Note (Signed)
Elevated today but patient acutely ill and on steroids, avoid all decongestants, minimize sodium and we will recheck at next visit.

## 2011-08-11 NOTE — Telephone Encounter (Signed)
Faxed request

## 2011-08-12 NOTE — Assessment & Plan Note (Signed)
Adequately controlled but patient noted to be mildly dehydrated encouraged to increase her po fluid intake

## 2011-08-12 NOTE — Assessment & Plan Note (Signed)
Wheezing and SOB worsening again over past 2 days since steroids stopped. She is restarted on a longer steroid taper and given a course of Omnicef 300mg  po bid and she will continue her inhalers. We will see hr in follow up next week to assess for improvment

## 2011-08-15 ENCOUNTER — Encounter: Payer: Self-pay | Admitting: Family Medicine

## 2011-08-15 ENCOUNTER — Ambulatory Visit (INDEPENDENT_AMBULATORY_CARE_PROVIDER_SITE_OTHER): Payer: PRIVATE HEALTH INSURANCE | Admitting: Family Medicine

## 2011-08-15 VITALS — BP 152/95 | HR 89 | Ht 63.0 in | Wt 314.0 lb

## 2011-08-15 DIAGNOSIS — D72829 Elevated white blood cell count, unspecified: Secondary | ICD-10-CM

## 2011-08-15 DIAGNOSIS — J189 Pneumonia, unspecified organism: Secondary | ICD-10-CM

## 2011-08-15 DIAGNOSIS — I10 Essential (primary) hypertension: Secondary | ICD-10-CM

## 2011-08-15 MED ORDER — CHLORTHALIDONE 25 MG PO TABS: 25.0000 mg | ORAL_TABLET | Freq: Every day | ORAL | Status: DC

## 2011-08-15 NOTE — Patient Instructions (Signed)
Edema Edema is an abnormal build-up of fluids in tissues. Because this is partly dependent on gravity (water flows to the lowest place), it is more common in the leg sand thighs (lower extremities). It is also common in the looser tissues, like around the eyes. Painless swelling of the feet and ankles is common and increases as a person ages. It may affect both legs and may include the calves or even thighs. When squeezed, the fluid may move out of the affected area and may leave a dent for a few moments. CAUSES   Prolonged standing or sitting in one place for extended periods of time. Movement helps pump tissue fluid into the veins, and absence of movement prevents this, resulting in edema.   Varicose veins. The valves in the veins do not work as well as they should. This causes fluid to leak into the tissues.   Fluid and salt overload.   Injury, burn, or surgery to the leg, ankle, or foot, may damage veins and allow fluid to leak out.   Sunburn damages vessels. Leaky vessels allow fluid to go out into the sunburned tissues.   Allergies (from insect bites or stings, medications or chemicals) cause swelling by allowing vessels to become leaky.   Protein in the blood helps keep fluid in your vessels. Low protein, as in malnutrition, allows fluid to leak out.   Hormonal changes, including pregnancy and menstruation, cause fluid retention. This fluid may leak out of vessels and cause edema.   Medications that cause fluid retention. Examples are sex hormones, blood pressure medications, steroid treatment, or anti-depressants.   Some illnesses cause edema, especially heart failure, kidney disease, or liver disease.   Surgery that cuts veins or lymph nodes, such as surgery done for the heart or for breast cancer, may result in edema.  DIAGNOSIS  Your caregiver is usually easily able to determine what is causing your swelling (edema) by simply asking what is wrong (getting a history) and examining  you (doing a physical). Sometimes x-rays, EKG (electrocardiogram or heart tracing), and blood work may be done to evaluate for underlying medical illness. TREATMENT  General treatment includes:  Leg elevation (or elevation of the affected body part).   Restriction of fluid intake.   Prevention of fluid overload.   Compression of the affected body part. Compression with elastic bandages or support stockings squeezes the tissues, preventing fluid from entering and forcing it back into the blood vessels.   Diuretics (also called water pills or fluid pills) pull fluid out of your body in the form of increased urination. These are effective in reducing the swelling, but can have side effects and must be used only under your caregiver's supervision. Diuretics are appropriate only for some types of edema.  The specific treatment can be directed at any underlying causes discovered. Heart, liver, or kidney disease should be treated appropriately. HOME CARE INSTRUCTIONS   Elevate the legs (or affected body part) above the level of the heart, while lying down.   Avoid sitting or standing still for prolonged periods of time.   Avoid putting anything directly under the knees when lying down, and do not wear constricting clothing or garters on the upper legs.   Exercising the legs causes the fluid to work back into the veins and lymphatic channels. This may help the swelling go down.   The pressure applied by elastic bandages or support stockings can help reduce ankle swelling.   A low-salt diet may help reduce fluid  retention and decrease the ankle swelling.   Take any medications exactly as prescribed.  SEEK MEDICAL CARE IF:  Your edema is not responding to recommended treatments. SEEK IMMEDIATE MEDICAL CARE IF:   You develop shortness of breath or chest pain.   You cannot breathe when you lay down; or if, while lying down, you have to get up and go to the window to get your breath.   You  are having increasing swelling without relief from treatment.   You develop a fever over 102 F (38.9 C).   You develop pain or redness in the areas that are swollen.   Tell your caregiver right away if you have gained 3 lb/1.4 kg in 1 day or 5 lb/2.3 kg in a week.  MAKE SURE YOU:   Understand these instructions.   Will watch your condition.   Will get help right away if you are not doing well or get worse.  Document Released: 07/10/2005 Document Revised: 03/22/2011 Document Reviewed: 02/26/2008 [facility] Patient Information 2012 [place], [place].   Jobst stockings 15-25 mmHg on in am off in pm DASH diet Put feet above heart for 15 minutes twice a day Start MegaRed caps daily

## 2011-08-16 NOTE — Assessment & Plan Note (Signed)
Greatly improved with repeat blood draw off of steroids, will continue monitor

## 2011-08-16 NOTE — Assessment & Plan Note (Addendum)
Adequately controlled at last few visits, will reassess at next visit as steroids taper down

## 2011-08-16 NOTE — Progress Notes (Signed)
[name], female   DOB: [birth year], 61 y.o.   MRN: [id number] Rebecca Brooks [id number] [birth year] 08/16/2011      Progress Note-Follow Up  Subjective  Chief Complaint  Chief Complaint  Patient presents with  . Follow-up    pneumonia    HPI  Patient is a 61 yo Caucasian female in today for follow up on her pneumonia and wheezing. She has improved since last week. She is not having any fevers/chills. Her cough and wheezing are still present but have improved greatly. She has not had another night where she has been up all night because she cannot breath. Since her restart of Prednisone and antibiotics her breathing has been much easier. No CP/palp/SOB/GI or GU c/o.   Past Medical History  Diagnosis Date  . Pneumonia     this October from which she has said inhailer  . Obesity   . Chicken pox as a child  . Measles as a child  . Mumps as a child  . Arthritis   . GERD (gastroesophageal reflux disease)   . Hyperlipidemia   . Hypertension   . Hypothyroidism   . Palpitations 08/01/2011  . Anemia 08/01/2011  . Hypokalemia 08/01/2011  . Leukocytosis 08/01/2011  . Low back pain radiating to right leg     intermittent and occasional numbness of skin over right hip in certain positions  . Candidal skin infection 08/11/2011    Past Surgical History  Procedure Date  . Thyroidectomy     total for benign tumor, Parathyroid spared  . Tonsillectomy   . Cesarean section     X 3  . Joint replacement 2006    right hip replacement, secondary to congenital  hip defect  . Hernia repair 61 yrs old    right inguinal    Family History  Problem Relation Age of Onset  . Cancer Mother 43    lung/ smoker  . Heart disease Mother   . Hearing loss Mother     tachycardia  . Mental illness Mother     anxiety, claustrophobia  . Osteoarthritis Father   . Hyperlipidemia Father   . Hyperlipidemia Sister   . Migraines Sister   . Hypertension Sister   . Cancer Maternal Grandmother    colon  . Heart attack Maternal Grandfather   . Aneurysm Paternal Grandfather     abdominal  . Heart disease Paternal Grandfather     AAA rupture, smoker    History   Social History  . Marital Status: Married    Spouse Name: N/A    Number of Children: N/A  . Years of Education: N/A   Occupational History  . Not on file.   Social History Main Topics  . Smoking status: Former Games developer  . Smokeless tobacco: Never Used   Comment: quit smoking 40 yrs ago  . Alcohol Use: 8.4 oz/week    14 Glasses of wine per week  . Drug Use: No  . Sexually Active: No   Other Topics Concern  . Not on file   Social History Narrative  . No narrative on file    Current Outpatient Prescriptions on File Prior to Visit  Medication Sig Dispense Refill  . acetaminophen (TYLENOL) 325 MG tablet Take 650 mg by mouth every 6 (six) hours as needed. As needed for pain       . albuterol (PROVENTIL) (2.5 MG/3ML) 0.083% nebulizer solution Take 2.5 mg by nebulization every 6 (six) hours as needed. For breathing      .  amLODipine (NORVASC) 10 MG tablet Take 1 tablet (10 mg total) by mouth daily.  30 tablet  0  . cholecalciferol (VITAMIN D) 1000 UNITS tablet Take 1,000 Units by mouth daily.      . diclofenac-misoprostol (ARTHROTEC 75) 75-200 MG-MCG per tablet Take 1 tablet by mouth 2 (two) times daily as needed (pain.).      Marland Kitchen famotidine (PEPCID) 20 MG tablet Take 1 tablet (20 mg total) by mouth at bedtime.  30 tablet  0  . levothyroxine (SYNTHROID, LEVOTHROID) 100 MCG tablet Take 100 mcg by mouth daily.        Marland Kitchen loratadine (CLARITIN) 10 MG tablet Take 10 mg by mouth daily.        . Miconazole Nitrate 2 % AERP Apply prn to rash  85 g  0  . pantoprazole (PROTONIX) 40 MG tablet Take 1 tablet (40 mg total) by mouth 2 (two) times daily before a meal.  60 tablet  0  . predniSONE (DELTASONE) 10 MG tablet Take 6 tabs daily x 5 day then take 5 tabs daily x 5 days then Take 4 tablets daily for 5 days, then 3 tablets daily  for 5 days, then 2 tablets daily for 5 days, then 1 tablet daily for 5 days, and then take 1/2 tab daily x 5 days then quit  110 tablet  0  . simvastatin (ZOCOR) 10 MG tablet Take 10 mg by mouth at bedtime.        . Fluticasone-Salmeterol (ADVAIR DISKUS) 250-50 MCG/DOSE AEPB Inhale 1 puff into the lungs 2 (two) times daily.  1 each  3    No Known Allergies  Review of Systems  Review of Systems  Constitutional: Negative for fever.  HENT: Positive for congestion.   Eyes: Negative for discharge.  Respiratory: Positive for cough. Negative for shortness of breath.   Cardiovascular: Negative for chest pain, palpitations and leg swelling.  Gastrointestinal: Negative for nausea, abdominal pain and diarrhea.  Genitourinary: Negative for dysuria.  Musculoskeletal: Negative for falls.  Skin: Negative for rash.  Neurological: Negative for loss of consciousness and headaches.  Endo/Heme/Allergies: Negative for polydipsia.  Psychiatric/Behavioral: Negative for depression and suicidal ideas. The patient is not nervous/anxious and does not have insomnia.     Objective  BP 152/95  Pulse 89  Ht 5\' 3"  (1.6 m)  Wt 314 lb (142.429 kg)  BMI 55.62 kg/m2  SpO2 95%  Physical Exam  Physical Exam  Constitutional: She is oriented to person, place, and time and well-developed, well-nourished, and in no distress. No distress.  HENT:  Head: Normocephalic and atraumatic.  Eyes: Conjunctivae are normal.  Neck: Neck supple. No thyromegaly present.  Cardiovascular: Normal rate, regular rhythm and normal heart sounds.   Pulmonary/Chest: Effort normal and breath sounds normal. She has no wheezes.  Abdominal: She exhibits no distension and no mass.  Musculoskeletal: She exhibits no edema.  Lymphadenopathy:    She has no cervical adenopathy.  Neurological: She is alert and oriented to person, place, and time.  Skin: Skin is warm and dry. No rash noted. She is not diaphoretic.  Psychiatric: Memory, affect  and judgment normal.    Lab Results  Component Value Date   TSH 0.451 07/18/2011   Lab Results  Component Value Date   WBC 14.0* 08/08/2011   HGB 12.6 08/08/2011   HCT 37.1 08/08/2011   MCV 92.3 08/08/2011   PLT 355.0 08/08/2011   Lab Results  Component Value Date   CREATININE 0.9 08/08/2011  BUN 12 08/08/2011   NA 142 08/08/2011   K 4.9 08/08/2011   CL 103 08/08/2011   CO2 28 08/08/2011   Lab Results  Component Value Date   ALT 14 07/19/2011   AST 10 07/19/2011   ALKPHOS 78 07/19/2011   BILITOT 0.2* 07/19/2011   Lab Results  Component Value Date   CHOL 192 07/18/2011   Lab Results  Component Value Date   HDL 72 07/18/2011   Lab Results  Component Value Date   LDLCALC 106* 07/18/2011   Lab Results  Component Value Date   TRIG 72 07/18/2011   Lab Results  Component Value Date   CHOLHDL 2.7 07/18/2011     Assessment & Plan  Leukocytosis Greatly improved with repeat blood draw off of steroids, will continue monitor  Hypertension Adequately controlled at last few visits, will reassess at next visit as steroids taper down  Pneumonia Improving, exam much better today, has a follow up appt with pulmonology at the end of the week. We will not restart antibiotics at this time and she will call if symptoms worsen

## 2011-08-16 NOTE — Assessment & Plan Note (Signed)
Improving, exam much better today, has a follow up appt with pulmonology at the end of the week. We will not restart antibiotics at this time and she will call if symptoms worsen

## 2011-08-18 ENCOUNTER — Ambulatory Visit (INDEPENDENT_AMBULATORY_CARE_PROVIDER_SITE_OTHER): Payer: PRIVATE HEALTH INSURANCE | Admitting: Internal Medicine

## 2011-08-18 ENCOUNTER — Encounter: Payer: Self-pay | Admitting: Internal Medicine

## 2011-08-18 VITALS — BP 146/98 | HR 83 | Temp 97.7°F | Ht 63.0 in | Wt 318.0 lb

## 2011-08-18 DIAGNOSIS — J189 Pneumonia, unspecified organism: Secondary | ICD-10-CM

## 2011-08-18 DIAGNOSIS — J45909 Unspecified asthma, uncomplicated: Secondary | ICD-10-CM

## 2011-08-18 DIAGNOSIS — I10 Essential (primary) hypertension: Secondary | ICD-10-CM

## 2011-08-18 NOTE — Progress Notes (Signed)
Subjective:     [name], female   DOB: 08-11-1950   MRN: [id number]  HPI  75 yowf quit smoking 1973 with onset of cough in Oct 2012 while on ACEI stopped on admit to [facility]   Date of Admission: 07/16/2011  Date of Discharge: 07/21/2011  Patient's PCP: None at this time.  Attending Physician:HONGALGI,ANAND  Consults: Pulmonology, Dr. Sandrea Hughs   Discharge Diagnoses:  1. Chronic cough secondary to upper airway cough syndrome 2. ? Community acquired pneumonia 3. Hypertension 4. ? Obstructive sleep apnea, needs outpatient evaluation. 5. Acute respiratory failure, will be on home oxygen. 6. Gastroesophageal reflux disease. 7. Hypothyroidism 8. Hyperlipidemia 9. Obesity   08/18/2011 1st pulmonary office eval   minimal smoking hx ? Dx of asthma in the 1990's with refractory cough on ACEI on admit 12/23 asked by triad to evaluate as inpt with abn CT chest 12/23  Present illness began in October  2012 with predominantly dry cough variably dx as bronchitis/ flu/ walking pna but never responded to outpt rx assoc with coughing so hard gags/ urinary incont/ looses breath and voice. ACEI stopped during admit >  At 1st ov complete resolution and all breathing problems and cough but worried they are going to come back and her cxr has not normalized yet.  No purulent sputum or hemoptysis  Sleeping ok without nocturnal  or early am exacerbation  of respiratory  c/o's or need for noct saba. Also denies any obvious fluctuation of symptoms with weather or environmental changes or other aggravating or alleviating factors except as outlined above   ROS  At present neg for  any significant sore throat, dysphagia, itching, sneezing,  nasal congestion or excess/ purulent secretions,  fever, chills, sweats, unintended wt loss, pleuritic or exertional cp, hempoptysis, orthopnea pnd or leg swelling.  Also denies presyncope, palpitations, heartburn, abdominal pain, nausea, vomiting, diarrhea  or  change in bowel or urinary habits, dysuria,hematuria,  rash, arthralgias, visual complaints, headache, numbness weakness or ataxia.      Review of Systems     Objective:   Physical Exam  Obese pleasant amb wf nad  Wt 318 08/18/11  HEENT: nl dentition, turbinates, and orophanx. Nl external ear canals without cough reflex   NECK :  without JVD/Nodes/TM/ nl carotid upstrokes bilaterally   LUNGS: no acc muscle use, clear to A and P bilaterally without cough on insp or exp maneuvers   CV:  RRR  no s3 or murmur or increase in P2, no edema   ABD:  soft and nontender with nl excursion in the supine position. No bruits or organomegaly, bowel sounds nl  MS:  warm without deformities, calf tenderness, cyanosis or clubbing  SKIN: warm and dry without lesions    NEURO:  alert, approp, no deficits      Assessment:         Plan:

## 2011-08-18 NOTE — Patient Instructions (Signed)
Stop advair Start symbicort 160 Take 2 puffs first thing in am and then another 2 puffs about 12 hours later for any respiratory symptom  Bystolic 5 mg take twice daily Prednisone 10 mg 2 daily x 3 days, then 1 daily x 3 days, then one half daily x 3 days and stop - if you worsen, restart at 20mg    Please schedule a follow up office visit in 6 weeks, call sooner if needed   Please schedule a follow up office visit in 2 weeks, sooner if needed with cxr on return

## 2011-08-18 NOTE — Progress Notes (Deleted)
sdfsa

## 2011-08-19 DIAGNOSIS — J45909 Unspecified asthma, uncomplicated: Secondary | ICD-10-CM | POA: Insufficient documentation

## 2011-08-19 NOTE — Assessment & Plan Note (Signed)
Not really clear even in retrospect how many of her symptoms were ace related but feel she likely has  Classic Upper airway cough syndrome, so named because it's frequently impossible to sort out how much is  CR/sinusitis with freq throat clearing (which can be related to primary GERD)   vs  causing  secondary (" extra esophageal")  GERD from wide swings in gastric pressure that occur with throat clearing, often  promoting self use of mint and menthol lozenges that reduce the lower esophageal sphincter tone and exacerbate the problem further in a cyclical fashion.   These are the same pts who not infrequently have failed to tolerate ace inhibitors,  dry powder inhalers or biphosphonates or report having reflux symptoms that don't respond to standard doses of PPI , and are easily confused as having aecopd or asthma flares,.  Off ace now but concerned about flare as prednisone is tapered so d/c DPI in favor of symbicort and f/u in 2 weeks after gentle taper off prednisone

## 2011-08-19 NOTE — Assessment & Plan Note (Addendum)
ACE inhibitors are problematic in  pts with airway complaints because  even experienced pulmonologists can't always distinguish ace effects from copd/asthma.  By themselves they don't actually cause a problem, much like oxygen can't by itself start a fire, but they certainly serve as a powerful catalyst or enhancer for any "fire"  or inflammatory process in the upper airway, be it caused by an ET  tube or more commonly reflux (especially in the obese or pts with known GERD or who are on biphoshonates).    In the era of ARB near equivalency until we have a better handle on the reversibility of the airway problem, it just makes sense to avoid ACEI  entirely in the short run and then decide later, having established a level of airway control using a reasonable limited regimen, whether to add back ace but even then being very careful to observe the pt for worsening airway control and number of meds used/ needed to control symptoms.   In her case I would avoid this class entirely and since issue of asthma has not been totally clarified Strongly prefer in this setting: Bystolic, the most beta -1  selective Beta blocker available in sample form, with bisoprolol the most selective generic choice  on the market.

## 2011-08-19 NOTE — Assessment & Plan Note (Signed)
The presence of air bronchograms is strongly in favor of cap or organizing pna and not malignancy, though BAC can look like this and she has been "sick since Oct" though hopefully this was all ACEI related   I had an extended discussion with the patient today lasting 15 to 20 minutes of a 25 minute visit on the following issues:  She does need careful f/u and fob if fails to clear the LLL changes radiographically over 6-8 weeks.

## 2011-08-21 ENCOUNTER — Other Ambulatory Visit: Payer: Self-pay | Admitting: Internal Medicine

## 2011-08-21 ENCOUNTER — Other Ambulatory Visit: Payer: Self-pay | Admitting: Family Medicine

## 2011-08-21 ENCOUNTER — Other Ambulatory Visit: Payer: Self-pay

## 2011-08-21 MED ORDER — FAMOTIDINE 20 MG PO TABS: 20.0000 mg | ORAL_TABLET | Freq: Every day | ORAL | Status: DC

## 2011-08-21 MED ORDER — LEVOTHYROXINE SODIUM 100 MCG PO TABS: 100.0000 ug | ORAL_TABLET | Freq: Every day | ORAL | Status: DC

## 2011-08-21 MED ORDER — AMLODIPINE BESYLATE 10 MG PO TABS: 10.0000 mg | ORAL_TABLET | Freq: Every day | ORAL | Status: DC

## 2011-08-21 MED ORDER — DICLOFENAC-MISOPROSTOL 75-0.2 MG PO TABS: 1.0000 | ORAL_TABLET | Freq: Two times a day (BID) | ORAL | Status: DC | PRN

## 2011-08-21 NOTE — Telephone Encounter (Signed)
Please advise refill? 

## 2011-08-21 NOTE — Telephone Encounter (Signed)
Please send in 90 day Rx for Amlodipine to [facility]

## 2011-08-22 ENCOUNTER — Ambulatory Visit: Payer: PRIVATE HEALTH INSURANCE | Admitting: Family Medicine

## 2011-08-29 ENCOUNTER — Ambulatory Visit (INDEPENDENT_AMBULATORY_CARE_PROVIDER_SITE_OTHER): Payer: PRIVATE HEALTH INSURANCE | Admitting: Family Medicine

## 2011-08-29 ENCOUNTER — Encounter: Payer: Self-pay | Admitting: Family Medicine

## 2011-08-29 DIAGNOSIS — I10 Essential (primary) hypertension: Secondary | ICD-10-CM

## 2011-08-29 DIAGNOSIS — J189 Pneumonia, unspecified organism: Secondary | ICD-10-CM

## 2011-08-29 DIAGNOSIS — D72829 Elevated white blood cell count, unspecified: Secondary | ICD-10-CM

## 2011-08-29 DIAGNOSIS — M25519 Pain in unspecified shoulder: Secondary | ICD-10-CM

## 2011-08-29 DIAGNOSIS — E785 Hyperlipidemia, unspecified: Secondary | ICD-10-CM

## 2011-08-29 DIAGNOSIS — B09 Unspecified viral infection characterized by skin and mucous membrane lesions: Secondary | ICD-10-CM | POA: Insufficient documentation

## 2011-08-29 DIAGNOSIS — E876 Hypokalemia: Secondary | ICD-10-CM

## 2011-08-29 DIAGNOSIS — E039 Hypothyroidism, unspecified: Secondary | ICD-10-CM

## 2011-08-29 DIAGNOSIS — B088 Other specified viral infections characterized by skin and mucous membrane lesions: Secondary | ICD-10-CM

## 2011-08-29 DIAGNOSIS — M25511 Pain in right shoulder: Secondary | ICD-10-CM

## 2011-08-29 LAB — RENAL FUNCTION PANEL
Albumin: 4 g/dL (ref 3.5–5.2)
Calcium: 10.1 mg/dL (ref 8.4–10.5)
Creat: 0.89 mg/dL (ref 0.50–1.10)
Phosphorus: 3.8 mg/dL (ref 2.3–4.6)

## 2011-08-29 LAB — CBC
Platelets: 446 10*3/uL — ABNORMAL HIGH (ref 150–400)
RDW: 14.7 % (ref 11.5–15.5)
WBC: 15.1 10*3/uL — ABNORMAL HIGH (ref 4.0–10.5)

## 2011-08-29 MED ORDER — LEVOTHYROXINE SODIUM 100 MCG PO TABS: 100.0000 ug | ORAL_TABLET | Freq: Every day | ORAL | Status: DC

## 2011-08-29 MED ORDER — NAPROXEN 500 MG PO TABS: 500.0000 mg | ORAL_TABLET | Freq: Two times a day (BID) | ORAL | Status: DC

## 2011-08-29 MED ORDER — NEBIVOLOL HCL 10 MG PO TABS: 10.0000 mg | ORAL_TABLET | Freq: Every day | ORAL | Status: DC

## 2011-08-29 MED ORDER — AMLODIPINE BESYLATE 10 MG PO TABS: 10.0000 mg | ORAL_TABLET | Freq: Every day | ORAL | Status: DC

## 2011-08-29 MED ORDER — ACYCLOVIR 400 MG PO TABS: 400.0000 mg | ORAL_TABLET | ORAL | Status: DC

## 2011-08-29 MED ORDER — BUDESONIDE-FORMOTEROL FUMARATE 160-4.5 MCG/ACT IN AERO: 2.0000 | INHALATION_SPRAY | Freq: Two times a day (BID) | RESPIRATORY_TRACT | Status: DC | PRN

## 2011-08-29 MED ORDER — SIMVASTATIN 10 MG PO TABS: 10.0000 mg | ORAL_TABLET | Freq: Every day | ORAL | Status: DC

## 2011-08-29 NOTE — Assessment & Plan Note (Signed)
Acyclovir 400mg  po 5 x daily and report if no improvement

## 2011-08-29 NOTE — Assessment & Plan Note (Signed)
Repeat CBC today, patient feeling much better 

## 2011-08-29 NOTE — Assessment & Plan Note (Signed)
Well controlled on current meds patient is given samples of Bystolic 10 mg tab to take one daily, she finds she often forgets the evening dose

## 2011-08-29 NOTE — Patient Instructions (Signed)

## 2011-08-29 NOTE — Assessment & Plan Note (Signed)
Improving and still doing OK off of steroids has an appt later this week for repeat cxr and follow up with pulmonology

## 2011-08-29 NOTE — Assessment & Plan Note (Signed)
Repeat renal today with initiation of Chlorthalidone

## 2011-08-29 NOTE — Progress Notes (Signed)
Patient ID: SYD MANGES, female   DOB: 1950/08/04, 61 y.o.   MRN: [id number] Rebecca Brooks [id number] 10/14/1950 08/29/2011      Progress Note-Follow Up  Subjective  Chief Complaint  Chief Complaint  Patient presents with  . Follow-up    3 week follow up    HPI  Patient is a 61 year old Caucasian female who is in today for followup of multiple medical problems. She is feeling much better. She finished her steroid taper quickly after seeing pulmonology and has not had a recurrence of her shortness of breath or coughs was very relieved. She's had no fevers, chills, chest pain, palpitations or worsening shortness of breath. Her major complaint today is a lesion on her left fifth finger. Between the first and second DIP joints. She describes it as tender and under the surface and denies trauma. No warmth or redness. No pus or drainage. No similar lesion in the past. For breathing she had been given a sample of 'NSTs and replacement of Advair but has not needed either one as her breathing has been well controlled.  Past Medical History  Diagnosis Date  . Pneumonia     this October from which she has said inhailer  . Obesity   . Chicken pox as a child  . Measles as a child  . Mumps as a child  . Arthritis   . GERD (gastroesophageal reflux disease)   . Hyperlipidemia   . Hypertension   . Hypothyroidism   . Palpitations 08/01/2011  . Anemia 08/01/2011  . Hypokalemia 08/01/2011  . Leukocytosis 08/01/2011  . Low back pain radiating to right leg     intermittent and occasional numbness of skin over right hip in certain positions  . Candidal skin infection 08/11/2011    Past Surgical History  Procedure Date  . Thyroidectomy     total for benign tumor, Parathyroid spared  . Tonsillectomy   . Cesarean section     X 3  . Joint replacement 2006    right hip replacement, secondary to congenital  hip defect  . Hernia repair 61 yrs old    right inguinal    Family History  Problem Relation  Age of Onset  . Cancer Mother 69    lung/ smoker  . Heart disease Mother   . Hearing loss Mother     tachycardia  . Mental illness Mother     anxiety, claustrophobia  . Osteoarthritis Father   . Hyperlipidemia Father   . Hyperlipidemia Sister   . Migraines Sister   . Hypertension Sister   . Cancer Maternal Grandmother     colon  . Heart attack Maternal Grandfather   . Aneurysm Paternal Grandfather     abdominal  . Heart disease Paternal Grandfather     AAA rupture, smoker    History   Social History  . Marital Status: Married    Spouse Name: N/A    Number of Children: N/A  . Years of Education: N/A   Occupational History  . Not on file.   Social History Main Topics  . Smoking status: Former Smoker -- 0.5 packs/day for 5 years    Types: Cigarettes    Quit date: 07/25/1971  . Smokeless tobacco: Never Used  . Alcohol Use: 8.4 oz/week    14 Glasses of wine per week  . Drug Use: No  . Sexually Active: No   Other Topics Concern  . Not on file   Social History Narrative  .  No narrative on file    Current Outpatient Prescriptions on File Prior to Visit  Medication Sig Dispense Refill  . acetaminophen (TYLENOL) 325 MG tablet Take 650 mg by mouth every 6 (six) hours as needed. As needed for pain       . albuterol (PROVENTIL) (2.5 MG/3ML) 0.083% nebulizer solution Take 2.5 mg by nebulization every 6 (six) hours as needed. For breathing      . chlorthalidone (HYGROTON) 25 MG tablet Take 1 tablet (25 mg total) by mouth daily.  90 tablet  0  . cholecalciferol (VITAMIN D) 1000 UNITS tablet Take 1,000 Units by mouth daily.      . famotidine (PEPCID) 20 MG tablet Take 1 tablet (20 mg total) by mouth at bedtime.  90 tablet  0  . loratadine (CLARITIN) 10 MG tablet Take 10 mg by mouth daily.        . Miconazole Nitrate 2 % AERP Apply prn to rash  85 g  0  . pantoprazole (PROTONIX) 40 MG tablet Take 1 tablet (40 mg total) by mouth 2 (two) times daily before a meal.  60 tablet  0     No Known Allergies  Review of Systems  Review of Systems  Constitutional: Negative for fever and malaise/fatigue.  HENT: Negative for congestion.   Eyes: Negative for discharge.  Respiratory: Negative for shortness of breath.   Cardiovascular: Negative for chest pain, palpitations and leg swelling.  Gastrointestinal: Negative for nausea, abdominal pain and diarrhea.  Genitourinary: Negative for dysuria.  Musculoskeletal: Negative for falls.  Skin: Positive for rash.       Painful, growing lesion on left 5th finger just this week, no warmth or erythema, no trauma or skin injury  Neurological: Negative for loss of consciousness and headaches.  Endo/Heme/Allergies: Negative for polydipsia.  Psychiatric/Behavioral: Negative for depression and suicidal ideas. The patient is not nervous/anxious and does not have insomnia.     Objective  BP 115/61  Pulse 96  Temp(Src) 98.6 F (37 C) (Temporal)  Ht 5\' 3"  (1.6 m)  Wt 316 lb 12.8 oz (143.7 kg)  BMI 56.12 kg/m2  SpO2 95%  Physical Exam  Physical Exam  Constitutional: She is oriented to person, place, and time and well-developed, well-nourished, and in no distress. No distress.  HENT:  Head: Normocephalic and atraumatic.  Eyes: Conjunctivae are normal.  Neck: Neck supple. No thyromegaly present.  Cardiovascular: Normal rate, regular rhythm and normal heart sounds.   No murmur heard. Pulmonary/Chest: Effort normal and breath sounds normal. She has no wheezes.  Abdominal: She exhibits no distension and no mass.  Musculoskeletal: She exhibits no edema.  Lymphadenopathy:    She has no cervical adenopathy.  Neurological: She is alert and oriented to person, place, and time.  Skin: Skin is warm and dry. No rash noted. She is not diaphoretic.  Psychiatric: Memory, affect and judgment normal.    Lab Results  Component Value Date   TSH 0.451 07/18/2011   Lab Results  Component Value Date   WBC 14.0* 08/08/2011   HGB 12.6  08/08/2011   HCT 37.1 08/08/2011   MCV 92.3 08/08/2011   PLT 355.0 08/08/2011   Lab Results  Component Value Date   CREATININE 0.9 08/08/2011   BUN 12 08/08/2011   NA 142 08/08/2011   K 4.9 08/08/2011   CL 103 08/08/2011   CO2 28 08/08/2011   Lab Results  Component Value Date   ALT 14 07/19/2011   AST 10 07/19/2011  ALKPHOS 78 07/19/2011   BILITOT 0.2* 07/19/2011   Lab Results  Component Value Date   CHOL 192 07/18/2011   Lab Results  Component Value Date   HDL 72 07/18/2011   Lab Results  Component Value Date   LDLCALC 106* 07/18/2011   Lab Results  Component Value Date   TRIG 72 07/18/2011   Lab Results  Component Value Date   CHOLHDL 2.7 07/18/2011     Assessment & Plan  Hypertension Well controlled on current meds patient is given samples of Bystolic 10 mg tab to take one daily, she finds she often forgets the evening dose  Leukocytosis Repeat CBC today, patient feeling much better  Hypokalemia Repeat renal today with initiation of Chlorthalidone  Pneumonia Improving and still doing OK off of steroids has an appt later this week for repeat cxr and follow up with pulmonology  Viral infection characterized by skin and mucous membrane lesions Acyclovir 400mg  po 5 x daily and report if no improvement

## 2011-08-31 ENCOUNTER — Other Ambulatory Visit: Payer: Self-pay | Admitting: Internal Medicine

## 2011-08-31 DIAGNOSIS — J189 Pneumonia, unspecified organism: Secondary | ICD-10-CM

## 2011-09-01 ENCOUNTER — Ambulatory Visit (INDEPENDENT_AMBULATORY_CARE_PROVIDER_SITE_OTHER): Payer: PRIVATE HEALTH INSURANCE | Admitting: Internal Medicine

## 2011-09-01 ENCOUNTER — Ambulatory Visit (INDEPENDENT_AMBULATORY_CARE_PROVIDER_SITE_OTHER)
Admission: RE | Admit: 2011-09-01 | Discharge: 2011-09-01 | Disposition: A | Discharge status: Home / Self Care | Payer: PRIVATE HEALTH INSURANCE | Source: Ambulatory Visit | Attending: Internal Medicine | Admitting: Internal Medicine

## 2011-09-01 ENCOUNTER — Encounter: Payer: Self-pay | Admitting: Internal Medicine

## 2011-09-01 VITALS — BP 128/88 | HR 88 | Temp 98.0°F | Ht 63.0 in | Wt 321.0 lb

## 2011-09-01 DIAGNOSIS — I10 Essential (primary) hypertension: Secondary | ICD-10-CM

## 2011-09-01 DIAGNOSIS — R0602 Shortness of breath: Secondary | ICD-10-CM

## 2011-09-01 DIAGNOSIS — J189 Pneumonia, unspecified organism: Secondary | ICD-10-CM

## 2011-09-01 DIAGNOSIS — J45909 Unspecified asthma, uncomplicated: Secondary | ICD-10-CM

## 2011-09-01 NOTE — Progress Notes (Signed)
Subjective:     [name], female   DOB: [birth year]   MRN: [id number]    Brief patient profile:  59 yowf quit smoking 1973 with onset of cough in Oct 2012 while on ACEI stopped on admit to [facility] with ? CAP> resolved by cxr 09/01/2011    HPI Date of Admission: 07/16/2011  Date of Discharge: 07/21/2011  Patient's PCP: None at this time.  Attending Physician:HONGALGI,ANAND  Consults: Pulmonology, Dr. Sandrea Hughs   Discharge Diagnoses:  1. Chronic cough secondary to upper airway cough syndrome 2. ? Community acquired pneumonia 3. Hypertension 4. ? Obstructive sleep apnea, needs outpatient evaluation. 5. Acute respiratory failure, will be on home oxygen. 6. Gastroesophageal reflux disease. 7. Hypothyroidism 8. Hyperlipidemia 9. Obesity   08/18/2011 1st pulmonary office eval/Cahterine Heinzel   minimal smoking hx ? Dx of asthma in the 1990's with refractory cough on ACEI on admit 12/23 asked by [facility] to evaluate as inpt with abn CT chest 12/23  Present illness really began in October  2012 with predominantly dry cough variably dx as bronchitis/ flu/ walking pna but never responded to outpt rx assoc with coughing so hard gags/ urinary incont/ looses breath and voice. ACEI stopped during admit >  At 1st ov complete resolution and all breathing problems and cough but worried they are going to come back and her cxr has not normalized yet. rec Stop advair Start symbicort 160 Take 2 puffs first thing in am and then another 2 puffs about 12 hours later for any respiratory symptom  Bystolic 5 mg take twice daily Prednisone 10 mg 2 daily x 3 days, then 1 daily x 3 days, then one half daily x 3 days and stop     09/01/2011 f/u ov/Holli Rengel cc back baseline activity with back problems stopping her before breathing really unless does x on  steps, no cough at all now.   Sleeping ok without nocturnal  or early am exacerbation  of respiratory  c/o's or need for noct saba. Also denies any obvious  fluctuation of symptoms with weather or environmental changes or other aggravating or alleviating factors except as outlined above   ROS  At present neg for  any significant sore throat, dysphagia, itching, sneezing,  nasal congestion or excess/ purulent secretions,  fever, chills, sweats, unintended wt loss, pleuritic or exertional cp, hempoptysis, orthopnea pnd or leg swelling.  Also denies presyncope, palpitations, heartburn, abdominal pain, nausea, vomiting, diarrhea  or change in bowel or urinary habits, dysuria,hematuria,  rash, arthralgias, visual complaints, headache, numbness weakness or ataxia.            Objective:   Physical Exam  Obese pleasant amb wf nad  Wt 318 08/18/11 > 09/01/2011  321  HEENT: nl dentition, turbinates, and orophanx. Nl external ear canals without cough reflex   NECK :  without JVD/Nodes/TM/ nl carotid upstrokes bilaterally   LUNGS: no acc muscle use, clear to A and P bilaterally without cough on insp or exp maneuvers   CV:  RRR  no s3 or murmur or increase in P2, no edema   ABD:  soft and nontender with nl excursion in the supine position. No bruits or organomegaly, bowel sounds nl  MS:  warm without deformities, calf tenderness, cyanosis or clubbing  SKIN: warm and dry without lesions    NEURO:  alert, approp, no deficits     CXR  09/01/2011 :  No definite active infiltrate. Stable borderline cardiomegaly.   Assessment:  Plan:

## 2011-09-01 NOTE — Patient Instructions (Signed)
Please schedule a follow up office visit in 6 weeks, call sooner if needed with PFT's on return 

## 2011-09-02 NOTE — Assessment & Plan Note (Signed)
No evidence of asthma off prednisone so strongly suspect her sob was related to ACEI chronically plus obesity / deconditioning, will do final set of pft's here to sort out

## 2011-09-02 NOTE — Assessment & Plan Note (Signed)
Adequate control on present rx, reviewed need to avoid acei indefinitely 

## 2011-09-02 NOTE — Assessment & Plan Note (Signed)
Completely cleared radiographically as have all her symptoms, no further f/u needed

## 2011-09-07 ENCOUNTER — Telehealth: Payer: Self-pay | Admitting: *Deleted

## 2011-09-07 NOTE — Telephone Encounter (Signed)
Pt states she was not notified of 2/5 labs.  Results given.  Advised pt to check MyChart for results and result note. Also, pt states "knot" on finger is not better.  She has finished meds and finger is not worse, but she does not see any improvement.  She says you mentioned in the last visit if no better you may refer to dermatology.  Pt is agreeable if this is next step. Pt aware Dr. Abner Greenspan out of office until Monday.  She will call for appt sooner if finger worsens.

## 2011-09-08 ENCOUNTER — Other Ambulatory Visit: Payer: Self-pay | Admitting: Family Medicine

## 2011-09-08 ENCOUNTER — Encounter: Payer: Self-pay | Admitting: Family Medicine

## 2011-09-08 ENCOUNTER — Ambulatory Visit (INDEPENDENT_AMBULATORY_CARE_PROVIDER_SITE_OTHER): Payer: PRIVATE HEALTH INSURANCE | Admitting: Family Medicine

## 2011-09-08 DIAGNOSIS — M130 Polyarthritis, unspecified: Secondary | ICD-10-CM

## 2011-09-08 DIAGNOSIS — M129 Arthropathy, unspecified: Secondary | ICD-10-CM

## 2011-09-08 DIAGNOSIS — I10 Essential (primary) hypertension: Secondary | ICD-10-CM

## 2011-09-08 DIAGNOSIS — R609 Edema, unspecified: Secondary | ICD-10-CM

## 2011-09-08 DIAGNOSIS — D72829 Elevated white blood cell count, unspecified: Secondary | ICD-10-CM

## 2011-09-08 MED ORDER — NEBIVOLOL HCL 20 MG PO TABS: 1.0000 | ORAL_TABLET | Freq: Every day | ORAL | Status: DC

## 2011-09-08 MED ORDER — HYDROCHLOROTHIAZIDE 25 MG PO TABS: 25.0000 mg | ORAL_TABLET | Freq: Every day | ORAL | Status: DC

## 2011-09-10 DIAGNOSIS — M159 Polyosteoarthritis, unspecified: Secondary | ICD-10-CM | POA: Insufficient documentation

## 2011-09-10 DIAGNOSIS — M069 Rheumatoid arthritis, unspecified: Secondary | ICD-10-CM

## 2011-09-10 DIAGNOSIS — R609 Edema, unspecified: Secondary | ICD-10-CM | POA: Insufficient documentation

## 2011-09-10 DIAGNOSIS — R600 Localized edema: Secondary | ICD-10-CM | POA: Insufficient documentation

## 2011-09-10 HISTORY — DX: Rheumatoid arthritis, unspecified: M06.9

## 2011-09-10 NOTE — Assessment & Plan Note (Signed)
Venous insufficiency + recent worsening coincidental with starting amlodipine. Additionally, she feels chlorthalidone not effective. Will d/c amlodipine and chlorthalidone. Restart HCTZ at 25mg  qd.

## 2011-09-10 NOTE — Assessment & Plan Note (Signed)
Increase bystolic to 20mg  qd. Also, changes as stated above in peripheral edema problem.

## 2011-09-10 NOTE — Progress Notes (Signed)
OFFICE VISIT  09/10/2011   CC:  Chief Complaint  Patient presents with  . Edema    legs and feet, painful last night     HPI:    Patient is a 61 y.o. Caucasian female who presents for worsened chronic LE swelling. Ever since she was put on amlodipine around Christmas 2012 she has noted it to be much more problematic.  She was on an ACE-I/hctz combo at the time and was taken off this b/c of significant side effects from the ACE-I.  Bystolic 10mg  was added soon after.  Chlorthalidone added a bit later.  BP still not well controlled and edema still bad.  Also says 5th digit on left hand still bothering her.  Question of vesicular lesion there in the recent past, was put on zovirax. Has hx of arthritic hands, some deformity.  Says rheum labs have been done in the past and were neg--approx 2+ yrs ago.  Past Medical History  Diagnosis Date  . Pneumonia     this October from which she has said inhailer  . Obesity   . Chicken pox as a child  . Measles as a child  . Mumps as a child  . Arthritis   . GERD (gastroesophageal reflux disease)   . Hyperlipidemia   . Hypertension   . Hypothyroidism   . Palpitations 08/01/2011  . Anemia 08/01/2011  . Hypokalemia 08/01/2011  . Leukocytosis 08/01/2011  . Low back pain radiating to right leg     intermittent and occasional numbness of skin over right hip in certain positions  . Candidal skin infection 08/11/2011    Past Surgical History  Procedure Date  . Thyroidectomy     total for benign tumor, Parathyroid spared  . Tonsillectomy   . Cesarean section     X 3  . Joint replacement 2006    right hip replacement, secondary to congenital  hip defect  . Hernia repair 61 yrs old    right inguinal    Outpatient Prescriptions Prior to Visit  Medication Sig Dispense Refill  . albuterol (PROVENTIL) (2.5 MG/3ML) 0.083% nebulizer solution Take 2.5 mg by nebulization every 6 (six) hours as needed. For breathing      . cholecalciferol (VITAMIN D) 1000  UNITS tablet Take 1,000 Units by mouth daily.      Marland Kitchen levothyroxine (SYNTHROID, LEVOTHROID) 100 MCG tablet Take 1 tablet (100 mcg total) by mouth daily.  90 tablet  1  . loratadine (CLARITIN) 10 MG tablet Take 10 mg by mouth daily.        . naproxen (NAPROSYN) 500 MG tablet Take 1 tablet (500 mg total) by mouth 2 (two) times daily with a meal. As needed for pain  180 tablet  1  . pantoprazole (PROTONIX) 40 MG tablet Take 1 tablet (40 mg total) by mouth 2 (two) times daily before a meal.  60 tablet  0  . simvastatin (ZOCOR) 10 MG tablet Take 1 tablet (10 mg total) by mouth at bedtime.  90 tablet  1  . amLODipine (NORVASC) 10 MG tablet Take 1 tablet (10 mg total) by mouth daily.  90 tablet  1  . chlorthalidone (HYGROTON) 25 MG tablet Take 1 tablet (25 mg total) by mouth daily.  90 tablet  0  . nebivolol (BYSTOLIC) 10 MG tablet Take 1 tablet (10 mg total) by mouth daily.  28 tablet  0  . budesonide-formoterol (SYMBICORT) 160-4.5 MCG/ACT inhaler Inhale 2 puffs into the lungs 2 (two) times daily  as needed. Patient has sample, is not using  1 Inhaler  0  . famotidine (PEPCID) 20 MG tablet Take 1 tablet (20 mg total) by mouth at bedtime.  90 tablet  0  . acyclovir (ZOVIRAX) 400 MG tablet Take 1 tablet (400 mg total) by mouth every 4 (four) hours while awake.  25 tablet  0  . Miconazole Nitrate 2 % AERP Apply prn to rash  85 g  0  . naproxen sodium (ANAPROX) 220 MG tablet Take 220 mg by mouth 2 (two) times daily with a meal.        No Known Allergies  ROS As per HPI  PE: Blood pressure 161/89, pulse 88, temperature 97.8 F (36.6 C), temperature source Temporal, weight 323 lb (146.512 kg). Gen: Alert, well appearing, obese.  Patient is oriented to person, place, time, and situation. LEGS: 2-3+ edema in both LE's from knees down to feet, R>L minimally. No erythema, no skin breakdown. HANDS: mildly hypertrophic MCPs, PIPs, and DIPs.  Left hand 5th digit with lateral area induration in the middle  phalanx.  No vesicle, no warmth or erythema.     LABS:  None today  IMPRESSION AND PLAN:  Peripheral edema Venous insufficiency + recent worsening coincidental with starting amlodipine. Additionally, she feels chlorthalidone not effective. Will d/c amlodipine and chlorthalidone. Restart HCTZ at 25mg  qd.   Hypertension Increase bystolic to 20mg  qd. Also, changes as stated above in peripheral edema problem.  Arthritis of multiple sites Primarily hands, symmetric, mild/mod deformity. Reassured her about her left 5th digit indurated area: likely related to arthritic deformity.  No sign of infection or abnormal skin lesion. Recommended ice bid and immobilize with splint x 1 wk.   Will recheck some rheumatoid labs: ANA, Rh factor, CCP.    FOLLOW UP: Return in about 2 weeks (around 09/22/2011) for f/u edema and HTN and arthritis.

## 2011-09-10 NOTE — Assessment & Plan Note (Signed)
Primarily hands, symmetric, mild/mod deformity. Reassured her about her left 5th digit indurated area: likely related to arthritic deformity.  No sign of infection or abnormal skin lesion. Recommended ice bid and immobilize with splint x 1 wk.   Will recheck some rheumatoid labs: ANA, Rh factor, CCP.

## 2011-09-11 NOTE — Telephone Encounter (Signed)
Dr. Abner Greenspan, is 90 day supply OK?

## 2011-09-12 ENCOUNTER — Ambulatory Visit (INDEPENDENT_AMBULATORY_CARE_PROVIDER_SITE_OTHER): Payer: PRIVATE HEALTH INSURANCE | Admitting: Family Medicine

## 2011-09-12 ENCOUNTER — Encounter: Payer: Self-pay | Admitting: Family Medicine

## 2011-09-12 DIAGNOSIS — R319 Hematuria, unspecified: Secondary | ICD-10-CM

## 2011-09-12 DIAGNOSIS — I10 Essential (primary) hypertension: Secondary | ICD-10-CM

## 2011-09-12 DIAGNOSIS — R609 Edema, unspecified: Secondary | ICD-10-CM

## 2011-09-12 DIAGNOSIS — E785 Hyperlipidemia, unspecified: Secondary | ICD-10-CM

## 2011-09-12 DIAGNOSIS — R350 Frequency of micturition: Secondary | ICD-10-CM

## 2011-09-12 DIAGNOSIS — N390 Urinary tract infection, site not specified: Secondary | ICD-10-CM

## 2011-09-12 LAB — POCT URINALYSIS DIPSTICK
Ketones, UA: NEGATIVE
Nitrite, UA: POSITIVE
Protein, UA: NEGATIVE
pH, UA: 6.5

## 2011-09-12 MED ORDER — PHENAZOPYRIDINE HCL 100 MG PO TABS: 100.0000 mg | ORAL_TABLET | Freq: Three times a day (TID) | ORAL | Status: AC | PRN

## 2011-09-12 MED ORDER — SIMVASTATIN 40 MG PO TABS: 40.0000 mg | ORAL_TABLET | Freq: Every evening | ORAL | Status: DC

## 2011-09-12 MED ORDER — FUROSEMIDE 20 MG PO TABS: ORAL_TABLET | ORAL | Status: DC

## 2011-09-12 MED ORDER — SULFAMETHOXAZOLE-TRIMETHOPRIM 800-160 MG PO TABS: 1.0000 | ORAL_TABLET | Freq: Two times a day (BID) | ORAL | Status: AC

## 2011-09-12 NOTE — Patient Instructions (Signed)

## 2011-09-13 LAB — CYCLIC CITRUL PEPTIDE ANTIBODY, IGG: Cyclic Citrullin Peptide Ab: <2 U/mL (ref 0.0–5.0)

## 2011-09-15 LAB — URINE CULTURE

## 2011-09-17 ENCOUNTER — Encounter: Payer: Self-pay | Admitting: Family Medicine

## 2011-09-17 DIAGNOSIS — N390 Urinary tract infection, site not specified: Secondary | ICD-10-CM | POA: Insufficient documentation

## 2011-09-17 NOTE — Assessment & Plan Note (Signed)
Elevated today with acute illness, will recheck at next visit.

## 2011-09-17 NOTE — Progress Notes (Signed)
[name], female   DOB: May 22, 1951, 61 y.o.   MRN: [id number] Rebecca Brooks [id number] [birth year] 09/17/2011      Progress Note-Follow Up  Subjective  Chief Complaint  Chief Complaint  Patient presents with  . Urinary Tract Infection    burning with urination X 4 days    HPI  Patient is a 61 year old Caucasian female who is in today for evaluation of malaise, myalgias, urinary frequency and urgency. She has some mild lower abdominal discomfort. No high-grade fevers or chills. No chest pain, palpitations and no shortness of breath is improving. Cough is nearly resolved.  Past Medical History  Diagnosis Date  . Pneumonia     this October from which she has said inhailer  . Obesity   . Chicken pox as a child  . Measles as a child  . Mumps as a child  . Arthritis   . GERD (gastroesophageal reflux disease)   . Hyperlipidemia   . Hypertension   . Hypothyroidism   . Palpitations 08/01/2011  . Anemia 08/01/2011  . Hypokalemia 08/01/2011  . Leukocytosis 08/01/2011  . Low back pain radiating to right leg     intermittent and occasional numbness of skin over right hip in certain positions  . Candidal skin infection 08/11/2011  . UTI (lower urinary tract infection) 09/17/2011    Past Surgical History  Procedure Date  . Thyroidectomy     total for benign tumor, Parathyroid spared  . Tonsillectomy   . Cesarean section     X 3  . Joint replacement 2006    right hip replacement, secondary to congenital  hip defect  . Hernia repair 61 yrs old    right inguinal    Family History  Problem Relation Age of Onset  . Cancer Mother 24    lung/ smoker  . Heart disease Mother   . Hearing loss Mother     tachycardia  . Mental illness Mother     anxiety, claustrophobia  . Osteoarthritis Father   . Hyperlipidemia Father   . Hyperlipidemia Sister   . Migraines Sister   . Hypertension Sister   . Cancer Maternal Grandmother     colon  . Heart attack Maternal Grandfather     . Aneurysm Paternal Grandfather     abdominal  . Heart disease Paternal Grandfather     AAA rupture, smoker    History   Social History  . Marital Status: Married    Spouse Name: N/A    Number of Children: N/A  . Years of Education: N/A   Occupational History  . Not on file.   Social History Main Topics  . Smoking status: Former Smoker -- 0.5 packs/day for 5 years    Types: Cigarettes    Quit date: 07/25/1971  . Smokeless tobacco: Never Used  . Alcohol Use: 8.4 oz/week    14 Glasses of wine per week  . Drug Use: No  . Sexually Active: No   Other Topics Concern  . Not on file   Social History Narrative  . No narrative on file    Current Outpatient Prescriptions on File Prior to Visit  Medication Sig Dispense Refill  . albuterol (PROVENTIL) (2.5 MG/3ML) 0.083% nebulizer solution Take 2.5 mg by nebulization every 6 (six) hours as needed. For breathing      . budesonide-formoterol (SYMBICORT) 160-4.5 MCG/ACT inhaler Inhale 2 puffs into the lungs 2 (two) times daily as needed. Patient has sample, is not using  1 Inhaler  0  . cholecalciferol (VITAMIN D) 1000 UNITS tablet Take 1,000 Units by mouth daily.      . famotidine (PEPCID) 20 MG tablet Take 1 tablet (20 mg total) by mouth at bedtime.  90 tablet  0  . hydrochlorothiazide (HYDRODIURIL) 25 MG tablet Take 1 tablet (25 mg total) by mouth daily.  90 tablet  1  . levothyroxine (SYNTHROID, LEVOTHROID) 100 MCG tablet Take 1 tablet (100 mcg total) by mouth daily.  90 tablet  1  . loratadine (CLARITIN) 10 MG tablet Take 10 mg by mouth daily.        . naproxen (NAPROSYN) 500 MG tablet Take 1 tablet (500 mg total) by mouth 2 (two) times daily with a meal. As needed for pain  180 tablet  1  . Nebivolol HCl (BYSTOLIC) 20 MG TABS Take 1 tablet (20 mg total) by mouth daily.  90 tablet  1  . pantoprazole (PROTONIX) 40 MG tablet Take 1 tablet (40 mg total) by mouth 2 (two) times daily before a meal.  60 tablet  0    No Known  Allergies  Review of Systems  Review of Systems  Constitutional: Positive for malaise/fatigue. Negative for fever.  HENT: Negative for congestion.   Eyes: Negative for discharge.  Respiratory: Negative for shortness of breath.   Cardiovascular: Negative for chest pain, palpitations and leg swelling.  Gastrointestinal: Positive for abdominal pain. Negative for nausea and diarrhea.  Genitourinary: Positive for urgency and frequency. Negative for dysuria.  Musculoskeletal: Positive for myalgias. Negative for falls.  Skin: Negative for rash.  Neurological: Negative for loss of consciousness and headaches.  Endo/Heme/Allergies: Negative for polydipsia.  Psychiatric/Behavioral: Negative for depression and suicidal ideas. The patient is not nervous/anxious and does not have insomnia.     Objective  BP 157/96  Pulse 82  Temp(Src) 97.5 F (36.4 C) (Temporal)  Ht 5\' 3"  (1.6 m)  Wt 323 lb 1.9 oz (146.566 kg)  BMI 57.24 kg/m2  SpO2 91%  Physical Exam  Physical Exam  Constitutional: She is oriented to person, place, and time and well-developed, well-nourished, and in no distress. No distress.  HENT:  Head: Normocephalic and atraumatic.  Eyes: Conjunctivae are normal.  Neck: Neck supple. No thyromegaly present.  Cardiovascular: Normal rate, regular rhythm and normal heart sounds.   Pulmonary/Chest: Effort normal and breath sounds normal. She has no wheezes.  Abdominal: She exhibits no distension and no mass.  Musculoskeletal: She exhibits no edema.  Lymphadenopathy:    She has no cervical adenopathy.  Neurological: She is alert and oriented to person, place, and time.  Skin: Skin is warm and dry. No rash noted. She is not diaphoretic.  Psychiatric: Memory, affect and judgment normal.    Lab Results  Component Value Date   TSH 0.451 07/18/2011   Lab Results  Component Value Date   WBC 15.1* 08/29/2011   HGB 12.7 08/29/2011   HCT 39.8 08/29/2011   MCV 93.9 08/29/2011   PLT 446*  08/29/2011   Lab Results  Component Value Date   CREATININE 0.89 08/29/2011   BUN 18 08/29/2011   NA 142 08/29/2011   K 3.8 08/29/2011   CL 100 08/29/2011   CO2 30 08/29/2011   Lab Results  Component Value Date   ALT 14 07/19/2011   AST 10 07/19/2011   ALKPHOS 78 07/19/2011   BILITOT 0.2* 07/19/2011   Lab Results  Component Value Date   CHOL 192 07/18/2011   Lab Results  Component Value Date   HDL 72 07/18/2011   Lab Results  Component Value Date   LDLCALC 106* 07/18/2011   Lab Results  Component Value Date   TRIG 72 07/18/2011   Lab Results  Component Value Date   CHOLHDL 2.7 07/18/2011     Assessment & Plan  Hypertension Elevated today with acute illness, will recheck at next visit.  UTI (lower urinary tract infection) Increase fluids, add cranberry, start Antibiotics and report if symptoms do not resolve

## 2011-09-17 NOTE — Assessment & Plan Note (Signed)
Increase fluids, add cranberry, start Antibiotics and report if symptoms do not resolve

## 2011-09-22 ENCOUNTER — Encounter: Payer: Self-pay | Admitting: Family Medicine

## 2011-09-22 ENCOUNTER — Ambulatory Visit (INDEPENDENT_AMBULATORY_CARE_PROVIDER_SITE_OTHER): Payer: PRIVATE HEALTH INSURANCE | Admitting: Family Medicine

## 2011-09-22 VITALS — BP 128/80 | HR 72 | Temp 98.1°F | Wt 311.0 lb

## 2011-09-22 DIAGNOSIS — K219 Gastro-esophageal reflux disease without esophagitis: Secondary | ICD-10-CM

## 2011-09-22 DIAGNOSIS — N390 Urinary tract infection, site not specified: Secondary | ICD-10-CM

## 2011-09-22 DIAGNOSIS — D649 Anemia, unspecified: Secondary | ICD-10-CM

## 2011-09-22 DIAGNOSIS — D72829 Elevated white blood cell count, unspecified: Secondary | ICD-10-CM

## 2011-09-22 DIAGNOSIS — M129 Arthropathy, unspecified: Secondary | ICD-10-CM

## 2011-09-22 DIAGNOSIS — J189 Pneumonia, unspecified organism: Secondary | ICD-10-CM

## 2011-09-22 DIAGNOSIS — M1990 Unspecified osteoarthritis, unspecified site: Secondary | ICD-10-CM

## 2011-09-22 MED ORDER — TRAMADOL HCL 50 MG PO TABS: 50.0000 mg | ORAL_TABLET | Freq: Three times a day (TID) | ORAL | Status: DC | PRN

## 2011-09-22 NOTE — Patient Instructions (Addendum)
Degenerative Arthritis You have osteoarthritis. This is the wear and tear arthritis that comes with aging. It is also called degenerative arthritis. This is common in people past middle age. It is caused by stress on the joints. The large weight bearing joints of the lower extremities are most often affected. The knees, hips, back, neck, and hands can become painful, swollen, and stiff. This is the most common type of arthritis. It comes on with age, carrying too much weight, or from an injury. Treatment includes resting the sore joint until the pain and swelling improve. Crutches or a walker may be needed for severe flares. Only take over-the-counter or prescription medicines for pain, discomfort, or fever as directed by your caregiver. Local heat therapy may improve motion. Cortisone shots into the joint are sometimes used to reduce pain and swelling during flares. Osteoarthritis is usually not crippling and progresses slowly. There are things you can do to decrease pain:  Avoid high impact activities.   Exercise regularly.   Low impact exercises such as walking, biking and swimming help to keep the muscles strong and keep normal joint function.   Stretching helps to keep your range of motion.   Lose weight if you are overweight. This reduces joint stress.  In severe cases when you have pain at rest or increasing disability, joint surgery may be helpful. See your caregiver for follow-up treatment as recommended.  SEEK IMMEDIATE MEDICAL CARE IF:   You have severe joint pain.   Marked swelling and redness in your joint develops.   You develop a high fever.  Document Released: 07/10/2005 Document Revised: 03/22/2011 Document Reviewed: 12/10/2006 [facility] Patient Information 2012 [place], [place].  Start MegaRed krill oil caps, 1 daily, by [employer]

## 2011-09-24 ENCOUNTER — Encounter: Payer: Self-pay | Admitting: Family Medicine

## 2011-09-24 NOTE — Assessment & Plan Note (Signed)
Resolved with recent blood work 

## 2011-09-24 NOTE — Assessment & Plan Note (Signed)
Well controlled on Protonix, avoid offending foods.

## 2011-09-24 NOTE — Assessment & Plan Note (Signed)
Doing much better, has appt with Pulmonology soon

## 2011-09-24 NOTE — Assessment & Plan Note (Signed)
Feeling much better. Will continue to monitor

## 2011-09-24 NOTE — Progress Notes (Signed)
[name], female   DOB: [birth year], 61 y.o.   MRN: [id number] Rebecca Brooks [id number] [birth year] 09/24/2011      Progress Note-Follow Up  Subjective  Chief Complaint  Chief Complaint  Patient presents with  . Follow-up    f/u on swelling and hypertension     HPI  This is a 61 year old Caucasian female who is in today for followup on multiple problems. She did have a UTI her last visit was well treated. Denies any urinary symptoms. Her edema is greatly improved. Her pain is greatly improved. She denies any shortness of breath, cough or symptoms as if her pneumonia is returning either. No chest pain or palpitations. Fatigue is lifting. She continues with chronic pain. Her knees are stiff and he believes morning. She continues to have a painful large firm nodule in the left fifth finger. She's had no recent fevers chills and congestion is greatly improved.  Past Medical History  Diagnosis Date  . Pneumonia     this October from which she has said inhailer  . Obesity   . Chicken pox as a child  . Measles as a child  . Mumps as a child  . Arthritis   . GERD (gastroesophageal reflux disease)   . Hyperlipidemia   . Hypertension   . Hypothyroidism   . Palpitations 08/01/2011  . Anemia 08/01/2011  . Hypokalemia 08/01/2011  . Leukocytosis 08/01/2011  . Low back pain radiating to right leg     intermittent and occasional numbness of skin over right hip in certain positions  . Candidal skin infection 08/11/2011  . UTI (lower urinary tract infection) 09/17/2011    Past Surgical History  Procedure Date  . Thyroidectomy     total for benign tumor, Parathyroid spared  . Tonsillectomy   . Cesarean section     X 3  . Joint replacement 2006    right hip replacement, secondary to congenital  hip defect  . Hernia repair 61 yrs old    right inguinal    Family History  Problem Relation Age of Onset  . Cancer Mother 52    lung/ smoker  . Heart disease Mother   . Hearing loss  Mother     tachycardia  . Mental illness Mother     anxiety, claustrophobia  . Osteoarthritis Father   . Hyperlipidemia Father   . Hyperlipidemia Sister   . Migraines Sister   . Hypertension Sister   . Cancer Maternal Grandmother     colon  . Heart attack Maternal Grandfather   . Aneurysm Paternal Grandfather     abdominal  . Heart disease Paternal Grandfather     AAA rupture, smoker    History   Social History  . Marital Status: Married    Spouse Name: N/A    Number of Children: N/A  . Years of Education: N/A   Occupational History  . Not on file.   Social History Main Topics  . Smoking status: Former Smoker -- 0.5 packs/day for 5 years    Types: Cigarettes    Quit date: 07/25/1971  . Smokeless tobacco: Never Used  . Alcohol Use: 8.4 oz/week    14 Glasses of wine per week  . Drug Use: No  . Sexually Active: No   Other Topics Concern  . Not on file   Social History Narrative  . No narrative on file    Current Outpatient Prescriptions on File Prior to Visit  Medication Sig Dispense  Refill  . albuterol (PROVENTIL) (2.5 MG/3ML) 0.083% nebulizer solution Take 2.5 mg by nebulization every 6 (six) hours as needed. For breathing      . budesonide-formoterol (SYMBICORT) 160-4.5 MCG/ACT inhaler Inhale 2 puffs into the lungs 2 (two) times daily as needed. Patient has sample, is not using  1 Inhaler  0  . cholecalciferol (VITAMIN D) 1000 UNITS tablet Take 1,000 Units by mouth daily.      . famotidine (PEPCID) 20 MG tablet Take 1 tablet (20 mg total) by mouth at bedtime.  90 tablet  0  . furosemide (LASIX) 20 MG tablet 2 tabs po daily x 3 days then 1 tab po daily as needed for edema or weight gain  30 tablet  1  . hydrochlorothiazide (HYDRODIURIL) 25 MG tablet Take 1 tablet (25 mg total) by mouth daily.  90 tablet  1  . levothyroxine (SYNTHROID, LEVOTHROID) 100 MCG tablet Take 1 tablet (100 mcg total) by mouth daily.  90 tablet  1  . loratadine (CLARITIN) 10 MG tablet Take  10 mg by mouth daily.        . naproxen (NAPROSYN) 500 MG tablet Take 1 tablet (500 mg total) by mouth 2 (two) times daily with a meal. As needed for pain  180 tablet  1  . Nebivolol HCl (BYSTOLIC) 20 MG TABS Take 1 tablet (20 mg total) by mouth daily.  90 tablet  1  . pantoprazole (PROTONIX) 40 MG tablet Take 1 tablet (40 mg total) by mouth 2 (two) times daily before a meal.  60 tablet  0  . simvastatin (ZOCOR) 40 MG tablet Take 1 tablet (40 mg total) by mouth every evening.  90 tablet  1    No Known Allergies  Review of Systems  Review of Systems  Constitutional: Negative for fever and malaise/fatigue.  HENT: Negative for congestion.   Eyes: Negative for discharge.  Respiratory: Negative for shortness of breath.   Cardiovascular: Negative for chest pain, palpitations and leg swelling.  Gastrointestinal: Negative for nausea, abdominal pain and diarrhea.  Genitourinary: Negative for dysuria.  Musculoskeletal: Positive for joint pain. Negative for falls.  Skin: Negative for rash.  Neurological: Negative for loss of consciousness and headaches.  Endo/Heme/Allergies: Negative for polydipsia.  Psychiatric/Behavioral: Negative for depression and suicidal ideas. The patient is not nervous/anxious and does not have insomnia.     Objective  BP 128/80  Pulse 72  Temp(Src) 98.1 F (36.7 C) (Oral)  Wt 311 lb (141.069 kg)  Physical Exam  Physical Exam  Constitutional: She is oriented to person, place, and time and well-developed, well-nourished, and in no distress. No distress.  HENT:  Head: Normocephalic and atraumatic.  Eyes: Conjunctivae are normal.  Neck: Neck supple. No thyromegaly present.  Cardiovascular: Normal rate, regular rhythm and normal heart sounds.   No murmur heard. Pulmonary/Chest: Effort normal and breath sounds normal. She has no wheezes.  Abdominal: She exhibits no distension and no mass.  Musculoskeletal: She exhibits edema.       Trace pedal edema b/l ankles    Lymphadenopathy:    She has no cervical adenopathy.  Neurological: She is alert and oriented to person, place, and time.  Skin: Skin is warm and dry. No rash noted. She is not diaphoretic.       1 cm tender nodule left 5 th finger.  Psychiatric: Memory, affect and judgment normal.    Lab Results  Component Value Date   TSH 0.451 07/18/2011   Lab Results  Component Value Date   WBC 15.1* 08/29/2011   HGB 12.7 08/29/2011   HCT 39.8 08/29/2011   MCV 93.9 08/29/2011   PLT 446* 08/29/2011   Lab Results  Component Value Date   CREATININE 0.89 08/29/2011   BUN 18 08/29/2011   NA 142 08/29/2011   K 3.8 08/29/2011   CL 100 08/29/2011   CO2 30 08/29/2011   Lab Results  Component Value Date   ALT 14 07/19/2011   AST 10 07/19/2011   ALKPHOS 78 07/19/2011   BILITOT 0.2* 07/19/2011   Lab Results  Component Value Date   CHOL 192 07/18/2011   Lab Results  Component Value Date   HDL 72 07/18/2011   Lab Results  Component Value Date   LDLCALC 106* 07/18/2011   Lab Results  Component Value Date   TRIG 72 07/18/2011   Lab Results  Component Value Date   CHOLHDL 2.7 07/18/2011     Assessment & Plan Pneumonia Doing much better, has appt with Pulmonology soon  Arthritis Has a painful nodule on her left 5th finger and her symptoms elsewhere ar worsening, b/l knees are bothering her, will refer her to Rheumatology at this time for further intervention. Continue fatty acid supplements and NSAIDs for now  Anemia Resolved with recent blood work  GERD (gastroesophageal reflux disease) Well controlled on Protonix, avoid offending foods.  UTI (lower urinary tract infection) Feeling much better. Will continue to monitor

## 2011-09-24 NOTE — Assessment & Plan Note (Signed)
Has a painful nodule on her left 5th finger and her symptoms elsewhere ar worsening, b/l knees are bothering her, will refer her to Rheumatology at this time for further intervention. Continue fatty acid supplements and NSAIDs for now

## 2011-09-27 ENCOUNTER — Other Ambulatory Visit: Payer: Self-pay | Admitting: Family Medicine

## 2011-09-27 DIAGNOSIS — M25569 Pain in unspecified knee: Secondary | ICD-10-CM

## 2011-09-27 DIAGNOSIS — R2230 Localized swelling, mass and lump, unspecified upper limb: Secondary | ICD-10-CM

## 2011-09-27 DIAGNOSIS — M1990 Unspecified osteoarthritis, unspecified site: Secondary | ICD-10-CM

## 2011-10-06 ENCOUNTER — Other Ambulatory Visit: Payer: Self-pay | Admitting: Family Medicine

## 2011-10-10 ENCOUNTER — Ambulatory Visit: Payer: PRIVATE HEALTH INSURANCE | Admitting: Family Medicine

## 2011-10-18 ENCOUNTER — Encounter: Payer: Self-pay | Admitting: Family Medicine

## 2011-10-18 ENCOUNTER — Ambulatory Visit (INDEPENDENT_AMBULATORY_CARE_PROVIDER_SITE_OTHER): Payer: PRIVATE HEALTH INSURANCE | Admitting: Internal Medicine

## 2011-10-18 ENCOUNTER — Ambulatory Visit (INDEPENDENT_AMBULATORY_CARE_PROVIDER_SITE_OTHER): Payer: PRIVATE HEALTH INSURANCE | Admitting: Family Medicine

## 2011-10-18 ENCOUNTER — Encounter: Payer: Self-pay | Admitting: Internal Medicine

## 2011-10-18 VITALS — BP 143/95 | HR 84 | Temp 97.6°F | Ht 63.0 in | Wt 328.8 lb

## 2011-10-18 DIAGNOSIS — R0602 Shortness of breath: Secondary | ICD-10-CM

## 2011-10-18 DIAGNOSIS — E876 Hypokalemia: Secondary | ICD-10-CM

## 2011-10-18 DIAGNOSIS — M25561 Pain in right knee: Secondary | ICD-10-CM

## 2011-10-18 DIAGNOSIS — J189 Pneumonia, unspecified organism: Secondary | ICD-10-CM

## 2011-10-18 DIAGNOSIS — E669 Obesity, unspecified: Secondary | ICD-10-CM

## 2011-10-18 DIAGNOSIS — D72829 Elevated white blood cell count, unspecified: Secondary | ICD-10-CM

## 2011-10-18 DIAGNOSIS — J45909 Unspecified asthma, uncomplicated: Secondary | ICD-10-CM

## 2011-10-18 DIAGNOSIS — M2550 Pain in unspecified joint: Secondary | ICD-10-CM | POA: Insufficient documentation

## 2011-10-18 DIAGNOSIS — I10 Essential (primary) hypertension: Secondary | ICD-10-CM

## 2011-10-18 DIAGNOSIS — M25569 Pain in unspecified knee: Secondary | ICD-10-CM

## 2011-10-18 LAB — CBC
HCT: 40.6 % (ref 36.0–46.0)
MCV: 96.9 fL (ref 78.0–100.0)
Platelets: 440 10*3/uL — ABNORMAL HIGH (ref 150–400)
RBC: 4.19 MIL/uL (ref 3.87–5.11)
WBC: 11.2 10*3/uL — ABNORMAL HIGH (ref 4.0–10.5)

## 2011-10-18 LAB — PULMONARY FUNCTION TEST

## 2011-10-18 LAB — RENAL FUNCTION PANEL
BUN: 20 mg/dL (ref 6–23)
Calcium: 9.8 mg/dL (ref 8.4–10.5)
Creat: 0.99 mg/dL (ref 0.50–1.10)
Phosphorus: 3.9 mg/dL (ref 2.3–4.6)
Potassium: 4.7 mEq/L (ref 3.5–5.3)

## 2011-10-18 MED ORDER — METHYLPREDNISOLONE 4 MG PO KIT: PACK | ORAL | Status: AC

## 2011-10-18 MED ORDER — MELOXICAM 15 MG PO TABS: 15.0000 mg | ORAL_TABLET | Freq: Every day | ORAL | Status: DC

## 2011-10-18 NOTE — Patient Instructions (Signed)
Only use the symbicort if needed   If you are satisfied with your treatment plan let your doctor know and he/she can either refill your medications or you can return here when your prescription runs out.     If in any way you are not 100% satisfied,  please tell us.  If 100% better, tell your friends!

## 2011-10-18 NOTE — Assessment & Plan Note (Signed)
Repeat cbc today 

## 2011-10-18 NOTE — Assessment & Plan Note (Signed)
Mild elevation today will not increase meds at this time

## 2011-10-18 NOTE — Assessment & Plan Note (Signed)
Continues to improve, will only have to return to Pulmonology if her symptoms worsen again

## 2011-10-18 NOTE — Assessment & Plan Note (Signed)
Discussed the DASH diet and need for increased activity again today. Patient is considering possibility of gastric bypass.

## 2011-10-18 NOTE — Assessment & Plan Note (Signed)
Repeat renal panel today 

## 2011-10-18 NOTE — Progress Notes (Signed)
[name], female   DOB: Mar 06, 1951, 61 y.o.   MRN: [id number] Rebecca Brooks [id number] 09-12-1950 10/18/2011      Progress Note-Follow Up  Subjective  Chief Complaint  Chief Complaint  Patient presents with  . Knee Pain    right knee X 7 days- ice, heat, and extra strength tylenol seem to help    HPI  Patient is a 61 year old Caucasian female who is in today with a roughly 7 day history of right knee pain. She denies any acute injury or fall. Pain is anterior and medial. It is worse with certain twisting movements and palpation. She has been alternating ice and heat and taking extra strength Tylenol in addition to her diclofenac and does get partial relief. She has similar pain about a month ago but it resolved spontaneously. She tried naproxen and a was not helpful so she would back to diclofenac. She does know she is to take the diclofenac with food but if she does that she tolerates it well. She is having subcostal and the past but felt it caused diarrhea. She denies any chest pain or palpitations continues to have improvement in her pulmonary function. Did see pulmonology earlier today and was given some work use only as needed and right now she feels that she's doing well. No wheezing, fevers, congestion, GI or GU complaints noted today.  Past Medical History  Diagnosis Date  . Pneumonia     this October from which she has said inhailer  . Obesity   . Chicken pox as a child  . Measles as a child  . Mumps as a child  . Arthritis   . GERD (gastroesophageal reflux disease)   . Hyperlipidemia   . Hypertension   . Hypothyroidism   . Palpitations 08/01/2011  . Anemia 08/01/2011  . Hypokalemia 08/01/2011  . Leukocytosis 08/01/2011  . Low back pain radiating to right leg     intermittent and occasional numbness of skin over right hip in certain positions  . Candidal skin infection 08/11/2011  . UTI (lower urinary tract infection) 09/17/2011  . Knee pain, right 10/18/2011     Past Surgical History  Procedure Date  . Thyroidectomy     total for benign tumor, Parathyroid spared  . Tonsillectomy   . Cesarean section     X 3  . Joint replacement 2006    right hip replacement, secondary to congenital  hip defect  . Hernia repair 61 yrs old    right inguinal    Family History  Problem Relation Age of Onset  . Cancer Mother 72    lung/ smoker  . Heart disease Mother   . Hearing loss Mother     tachycardia  . Mental illness Mother     anxiety, claustrophobia  . Osteoarthritis Father   . Hyperlipidemia Father   . Hyperlipidemia Sister   . Migraines Sister   . Hypertension Sister   . Cancer Maternal Grandmother     colon  . Heart attack Maternal Grandfather   . Aneurysm Paternal Grandfather     abdominal  . Heart disease Paternal Grandfather     AAA rupture, smoker    History   Social History  . Marital Status: Married    Spouse Name: N/A    Number of Children: N/A  . Years of Education: N/A   Occupational History  . Not on file.   Social History Main Topics  . Smoking status: Former Smoker -- 0.5  packs/day for 5 years    Types: Cigarettes    Quit date: 07/25/1971  . Smokeless tobacco: Never Used  . Alcohol Use: 8.4 oz/week    14 Glasses of wine per week  . Drug Use: No  . Sexually Active: No   Other Topics Concern  . Not on file   Social History Narrative  . No narrative on file    Current Outpatient Prescriptions on File Prior to Visit  Medication Sig Dispense Refill  . cholecalciferol (VITAMIN D) 1000 UNITS tablet Take 1,000 Units by mouth daily.      . diclofenac (VOLTAREN) 75 MG EC tablet Take 75 mg by mouth 2 (two) times daily.      . famotidine (PEPCID) 20 MG tablet Take 1 tablet (20 mg total) by mouth at bedtime.  90 tablet  0  . furosemide (LASIX) 20 MG tablet 1 tablet daily as needed      . hydrochlorothiazide (HYDRODIURIL) 25 MG tablet Take 1 tablet (25 mg total) by mouth daily.  90 tablet  1  . levothyroxine  (SYNTHROID, LEVOTHROID) 100 MCG tablet Take 1 tablet (100 mcg total) by mouth daily.  90 tablet  1  . loratadine (CLARITIN) 10 MG tablet Take 10 mg by mouth daily.        . Nebivolol HCl (BYSTOLIC) 20 MG TABS Take 1 tablet (20 mg total) by mouth daily.  90 tablet  1  . pantoprazole (PROTONIX) 40 MG tablet TAKE 1 TABLET BY MOUTH TWICE DAILY BEFORE MEALS  60 tablet  0  . simvastatin (ZOCOR) 40 MG tablet Take 1 tablet (40 mg total) by mouth every evening.  90 tablet  1  . albuterol (PROVENTIL) (2.5 MG/3ML) 0.083% nebulizer solution Take 2.5 mg by nebulization every 6 (six) hours as needed. For breathing        No Known Allergies  Review of Systems  Review of Systems  Constitutional: Negative for fever and malaise/fatigue.  HENT: Negative for congestion.   Eyes: Negative for discharge.  Respiratory: Negative for shortness of breath.   Cardiovascular: Negative for chest pain, palpitations and leg swelling.  Gastrointestinal: Negative for nausea, abdominal pain and diarrhea.  Genitourinary: Negative for dysuria.  Musculoskeletal: Positive for joint pain. Negative for falls.       Right knee pain  Skin: Negative for rash.  Neurological: Negative for loss of consciousness and headaches.  Endo/Heme/Allergies: Negative for polydipsia.  Psychiatric/Behavioral: Negative for depression and suicidal ideas. The patient is not nervous/anxious and does not have insomnia.     Objective  BP 143/95  Pulse 84  Temp(Src) 97.6 F (36.4 C) (Temporal)  Ht 5\' 3"  (1.6 m)  Wt 328 lb 12.8 oz (149.143 kg)  BMI 58.24 kg/m2  SpO2 96%  Physical Exam  Physical Exam  Constitutional: She is oriented to person, place, and time and well-developed, well-nourished, and in no distress. No distress.  HENT:  Head: Normocephalic and atraumatic.  Eyes: Conjunctivae are normal.  Neck: Neck supple. No thyromegaly present.  Cardiovascular: Normal rate, regular rhythm and normal heart sounds.   No murmur  heard. Pulmonary/Chest: Effort normal and breath sounds normal. She has no wheezes.  Abdominal: She exhibits no distension and no mass.  Musculoskeletal: Normal range of motion. She exhibits tenderness. She exhibits no edema.       Right knee pain with palp over medial meniscus and medial to the patella. No ligamentous laxity of other point tenderness noted in joint  Lymphadenopathy:    She  has no cervical adenopathy.  Neurological: She is alert and oriented to person, place, and time.  Skin: Skin is warm and dry. No rash noted. She is not diaphoretic.  Psychiatric: Memory, affect and judgment normal.    Lab Results  Component Value Date   TSH 0.451 07/18/2011   Lab Results  Component Value Date   WBC 15.1* 08/29/2011   HGB 12.7 08/29/2011   HCT 39.8 08/29/2011   MCV 93.9 08/29/2011   PLT 446* 08/29/2011   Lab Results  Component Value Date   CREATININE 0.89 08/29/2011   BUN 18 08/29/2011   NA 142 08/29/2011   K 3.8 08/29/2011   CL 100 08/29/2011   CO2 30 08/29/2011   Lab Results  Component Value Date   ALT 14 07/19/2011   AST 10 07/19/2011   ALKPHOS 78 07/19/2011   BILITOT 0.2* 07/19/2011   Lab Results  Component Value Date   CHOL 192 07/18/2011   Lab Results  Component Value Date   HDL 72 07/18/2011   Lab Results  Component Value Date   LDLCALC 106* 07/18/2011   Lab Results  Component Value Date   TRIG 72 07/18/2011   Lab Results  Component Value Date   CHOLHDL 2.7 07/18/2011     Assessment & Plan  Leukocytosis Repeat cbc today  Hypokalemia Repeat renal panel today  Obesity (BMI 30-39.9) Discussed the DASH diet and need for increased activity again today. Patient is considering possibility of gastric bypass.  Hypertension Mild elevation today will not increase meds at this time  Knee pain, right X 7 days worse with palpation and twisting, pain is antero medial, consider prepatellar bursitis vs medial meniscal strain. Apply ice and Aspercreme bid and given a  Medrol dose pak. Call if symptoms worsen. Has an appt with rheumatology soon. Patient reports Naproxen was not working so she went back to Diclofenac. She is instructed to stop all NSAIDs while on Medrol and then she is given Meloxicam to try after that  Pneumonia Continues to improve, will only have to return to Pulmonology if her symptoms worsen again

## 2011-10-18 NOTE — Patient Instructions (Signed)
Bursitis Bursitis is when the fluid-filled sac (bursa) that covers and protects a joint gets puffy and irritated. The elbow, shoulder, hip, and knee joints are most often affected. HOME CARE  Put ice on the area.   Put ice in a plastic bag.   Place a towel between your skin and the bag.   Leave the ice on for 15 to 20 minutes, 3 to 4 times a day.   Put the joint through a full range of motion 4 times a day. Rest the injured joint at other times. When you have less pain, begin slow movements and usual activities.   Only take medicine as told by your doctor.   Follow up with your doctor. Any delay in care could stop the bursitis from healing. This could cause long-term pain.  GET HELP RIGHT AWAY IF:   You have more pain with treatment.   You have a temperature by mouth above 102 F (38.9 C), not controlled by medicine.   You have heat and irritation over the fluid-filled sac.  MAKE SURE YOU:   Understand these instructions.   Will watch your condition.   Will get help right away if you are not doing well or get worse.  Document Released: 12/28/2009 Document Revised: 06/29/2011 Document Reviewed: 12/28/2009 [facility] Patient Information 2012 [place], [place].   Apply ice and aspercreme to right knee twice and try a soft brace daily, call if no improvement

## 2011-10-18 NOTE — Progress Notes (Signed)
Subjective:     [name], female   DOB: February 12, 1951   MRN: [id number]    Brief patient profile:  56 yowf quit smoking 1973 with onset of cough in Oct 2012 while on ACEI stopped on admit to [facility] with ? CAP> resolved by cxr 09/01/2011    HPI Date of Admission: 07/16/2011  Date of Discharge: 07/21/2011  Patient's PCP: None at this time.  Attending Physician:HONGALGI,ANAND  Consults: Pulmonology, Dr. Sandrea Hughs   Discharge Diagnoses:  1. Chronic cough secondary to upper airway cough syndrome 2. ? Community acquired pneumonia 3. Hypertension 4. ? Obstructive sleep apnea, needs outpatient evaluation. 5. Acute respiratory failure, will be on home oxygen. 6. Gastroesophageal reflux disease. 7. Hypothyroidism 8. Hyperlipidemia 9. Obesity   08/18/2011 1st pulmonary office eval/Rebecca Brooks   minimal smoking hx ? Dx of asthma in the 1990's with refractory cough on ACEI on admit 12/23 asked by [facility] to evaluate as inpt with abn CT chest 12/23  Present illness really began in October  2012 with predominantly dry cough variably dx as bronchitis/ flu/ walking pna but never responded to outpt rx assoc with coughing so hard gags/ urinary incont/ looses breath and voice. ACEI stopped during admit >  At 1st ov complete resolution and all breathing problems and cough but worried they are going to come back and her cxr has not normalized yet. rec Stop advair Start symbicort 160 Take 2 puffs first thing in am and then another 2 puffs about 12 hours later for any respiratory symptom  Bystolic 5 mg take twice daily Prednisone 10 mg 2 daily x 3 days, then 1 daily x 3 days, then one half daily x 3 days and stop     09/01/2011 f/u ov/Rebecca Brooks cc back baseline activity with back problems stopping her before breathing really unless does x on  steps, no cough at all now. rec No change rx pft's on return   10/18/2011 f/u ov/Rebecca Brooks cc no cough or sob on no inhalers at all since prev ov frustrated with wt gain  and considering gastric bypass.    Sleeping ok without nocturnal  or early am exacerbation  of respiratory  c/o's or need for noct saba. Also denies any obvious fluctuation of symptoms with weather or environmental changes or other aggravating or alleviating factors except as outlined above   ROS  At present neg for  any significant sore throat, dysphagia, itching, sneezing,  nasal congestion or excess/ purulent secretions,  fever, chills, sweats, unintended wt loss, pleuritic or exertional cp, hempoptysis, orthopnea pnd or leg swelling.  Also denies presyncope, palpitations, heartburn, abdominal pain, nausea, vomiting, diarrhea  or change in bowel or urinary habits, dysuria,hematuria,  rash, arthralgias, visual complaints, headache, numbness weakness or ataxia.            Objective:   Physical Exam  Obese pleasant amb wf nad  Wt 318 08/18/11 > 09/01/2011  321> 10/18/2011  323   HEENT: nl dentition, turbinates, and orophanx. Nl external ear canals without cough reflex   NECK :  without JVD/Nodes/TM/ nl carotid upstrokes bilaterally   LUNGS: no acc muscle use, clear to A and P bilaterally without cough on insp or exp maneuvers   CV:  RRR  no s3 or murmur or increase in P2, no edema   ABD:  soft and nontender with nl excursion in the supine position. No bruits or organomegaly, bowel sounds nl  MS:  warm without deformities, calf tenderness, cyanosis or clubbing  SKIN: warm and dry without lesions    NEURO:  alert, approp, no deficits     CXR  09/01/2011 :  No definite active infiltrate. Stable borderline cardiomegaly.   Assessment:         Plan:

## 2011-10-18 NOTE — Progress Notes (Signed)
PFT done today. 

## 2011-10-18 NOTE — Assessment & Plan Note (Addendum)
X 7 days worse with palpation and twisting, pain is antero medial, consider prepatellar bursitis vs medial meniscal strain. Apply ice and Aspercreme bid and given a Medrol dose pak. Call if symptoms worsen. Has an appt with rheumatology soon. Patient reports Naproxen was not working so she went back to Diclofenac. She is instructed to stop all NSAIDs while on Medrol and then she is given Meloxicam to try after that

## 2011-10-18 NOTE — Assessment & Plan Note (Signed)
-   HFA 75% 08/18/11    -PFT's off symbicort x one month 10/18/2011 FEV1  2.04 (95%) ratio 78 and ERV 62 with DLCO 46%  No evidence of active asthma, more than likely this was all  Classic Upper airway cough syndrome, so named because it's frequently impossible to sort out how much is  CR/sinusitis with freq throat clearing (which can be related to primary GERD)   vs  causing  secondary (" extra esophageal")  GERD from wide swings in gastric pressure that occur with throat clearing, often  promoting self use of mint and menthol lozenges that reduce the lower esophageal sphincter tone and exacerbate the problem further in a cyclical fashion.   These are the same pts (now being labeled as having "irritable larynx syndrome" by some cough centers) who not infrequently have a history of having failed to tolerate ace inhibitors,  dry powder inhalers or biphosphonates or report having atypical reflux symptoms that don't respond to standard doses of PPI , and are easily confused as having aecopd or asthma flares by even experienced allergists/ pulmonologists.   Rec maintain off acei indef, use symbicort only if symptoms recur, pulmonary f/u prn

## 2011-10-19 MED ORDER — ALLOPURINOL 100 MG PO TABS: 100.0000 mg | ORAL_TABLET | Freq: Every day | ORAL | Status: DC

## 2011-10-19 NOTE — Progress Notes (Signed)
Addended by: Court Joy on: 10/19/2011 08:30 AM   Modules accepted: Orders

## 2011-11-01 ENCOUNTER — Ambulatory Visit (INDEPENDENT_AMBULATORY_CARE_PROVIDER_SITE_OTHER): Payer: PRIVATE HEALTH INSURANCE | Admitting: Family Medicine

## 2011-11-01 ENCOUNTER — Encounter: Payer: Self-pay | Admitting: Family Medicine

## 2011-11-01 VITALS — BP 110/70 | HR 92 | Temp 97.4°F | Ht 63.0 in | Wt 322.1 lb

## 2011-11-01 DIAGNOSIS — I10 Essential (primary) hypertension: Secondary | ICD-10-CM

## 2011-11-01 DIAGNOSIS — M25512 Pain in left shoulder: Secondary | ICD-10-CM | POA: Insufficient documentation

## 2011-11-01 DIAGNOSIS — M25511 Pain in right shoulder: Secondary | ICD-10-CM

## 2011-11-01 DIAGNOSIS — E669 Obesity, unspecified: Secondary | ICD-10-CM

## 2011-11-01 DIAGNOSIS — J189 Pneumonia, unspecified organism: Secondary | ICD-10-CM

## 2011-11-01 DIAGNOSIS — M130 Polyarthritis, unspecified: Secondary | ICD-10-CM

## 2011-11-01 DIAGNOSIS — M129 Arthropathy, unspecified: Secondary | ICD-10-CM

## 2011-11-01 DIAGNOSIS — R609 Edema, unspecified: Secondary | ICD-10-CM

## 2011-11-01 DIAGNOSIS — M25519 Pain in unspecified shoulder: Secondary | ICD-10-CM

## 2011-11-01 NOTE — Assessment & Plan Note (Signed)
Symptoms and right shoulder had been around for several months. She is describing symptoms consistent with frozen shoulder. She is unable to abduct her right arm greater than 90 from her body and she has pain. Stiffness is also noted. He has been progressively worsening. She also complains a long history of some intermittent left shoulder discomfort and frequently waking up with paresthesias and numbness in her left arm. When she turns her head and moves her arm the paresthesias and numbness resolved. She is telling us today for the first time that she did injure her neck years ago and was told she had some collapsed vertebrae in her cervical spine along the left side. She notes she's not had any significant trouble with them for years. She agrees to a physiatry consultation at this time and she is referred for further intervention.

## 2011-11-01 NOTE — Assessment & Plan Note (Signed)
Has appt with Rheumatology later this month.

## 2011-11-01 NOTE — Patient Instructions (Addendum)
Edema Edema is an abnormal build-up of fluids in tissues. Because this is partly dependent on gravity (water flows to the lowest place), it is more common in the leg sand thighs (lower extremities). It is also common in the looser tissues, like around the eyes. Painless swelling of the feet and ankles is common and increases as a person ages. It may affect both legs and may include the calves or even thighs. When squeezed, the fluid may move out of the affected area and may leave a dent for a few moments. CAUSES   Prolonged standing or sitting in one place for extended periods of time. Movement helps pump tissue fluid into the veins, and absence of movement prevents this, resulting in edema.   Varicose veins. The valves in the veins do not work as well as they should. This causes fluid to leak into the tissues.   Fluid and salt overload.   Injury, burn, or surgery to the leg, ankle, or foot, may damage veins and allow fluid to leak out.   Sunburn damages vessels. Leaky vessels allow fluid to go out into the sunburned tissues.   Allergies (from insect bites or stings, medications or chemicals) cause swelling by allowing vessels to become leaky.   Protein in the blood helps keep fluid in your vessels. Low protein, as in malnutrition, allows fluid to leak out.   Hormonal changes, including pregnancy and menstruation, cause fluid retention. This fluid may leak out of vessels and cause edema.   Medications that cause fluid retention. Examples are sex hormones, blood pressure medications, steroid treatment, or anti-depressants.   Some illnesses cause edema, especially heart failure, kidney disease, or liver disease.   Surgery that cuts veins or lymph nodes, such as surgery done for the heart or for breast cancer, may result in edema.  DIAGNOSIS  Your caregiver is usually easily able to determine what is causing your swelling (edema) by simply asking what is wrong (getting a history) and examining  you (doing a physical). Sometimes x-rays, EKG (electrocardiogram or heart tracing), and blood work may be done to evaluate for underlying medical illness. TREATMENT  General treatment includes:  Leg elevation (or elevation of the affected body part).   Restriction of fluid intake.   Prevention of fluid overload.   Compression of the affected body part. Compression with elastic bandages or support stockings squeezes the tissues, preventing fluid from entering and forcing it back into the blood vessels.   Diuretics (also called water pills or fluid pills) pull fluid out of your body in the form of increased urination. These are effective in reducing the swelling, but can have side effects and must be used only under your caregiver's supervision. Diuretics are appropriate only for some types of edema.  The specific treatment can be directed at any underlying causes discovered. Heart, liver, or kidney disease should be treated appropriately. HOME CARE INSTRUCTIONS   Elevate the legs (or affected body part) above the level of the heart, while lying down.   Avoid sitting or standing still for prolonged periods of time.   Avoid putting anything directly under the knees when lying down, and do not wear constricting clothing or garters on the upper legs.   Exercising the legs causes the fluid to work back into the veins and lymphatic channels. This may help the swelling go down.   The pressure applied by elastic bandages or support stockings can help reduce ankle swelling.   A low-salt diet may help reduce fluid  retention and decrease the ankle swelling.   Take any medications exactly as prescribed.  SEEK MEDICAL CARE IF:  Your edema is not responding to recommended treatments. SEEK IMMEDIATE MEDICAL CARE IF:   You develop shortness of breath or chest pain.   You cannot breathe when you lay down; or if, while lying down, you have to get up and go to the window to get your breath.   You  are having increasing swelling without relief from treatment.   You develop a fever over 102 F (38.9 C).   You develop pain or redness in the areas that are swollen.   Tell your caregiver right away if you have gained 3 lb/1.4 kg in 1 day or 5 lb/2.3 kg in a week.  MAKE SURE YOU:   Understand these instructions.   Will watch your condition.   Will get help right away if you are not doing well or get worse.  Document Released: 07/10/2005 Document Revised: 06/29/2011 Document Reviewed: 02/26/2008 [facility] Patient Information 2012 [place], [place]. Elevate feet, minimize sodium, try the Jobst compression hose, knee his, on in am off in pm, only need light weight versions.   Take a Lasix a day for the next 3-4 days, if no improvement in the edema try holding the Meloxicam for 4-7 days if edema resolves and then recurs when you restart it we will have to try a different pain med

## 2011-11-01 NOTE — Assessment & Plan Note (Signed)
Is attempting to stay more active and change her eating habits. Avoiding trans fats and simple carbs
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
## 2011-11-01 NOTE — Assessment & Plan Note (Signed)
Doing well, has been released from pulmonary unless she has recurrent episodes

## 2011-11-01 NOTE — Assessment & Plan Note (Signed)
Recurrent, patient reports was cleared last week after coming off the prednisone. She was off prednisone all normal white count that we can edema recurred a couple days ago. She doesand not putting her feet up as much as well. She will take Lasix daily for the next 3-4 days keep her feet up, avoid salt and if her edema does not come down we'll hold off For week and see if that helps. If edema resolves and when she restarts the meloxicam the edema recurs we will have to change medications. She is also encouraged to try Jobst stockings.

## 2011-11-01 NOTE — Assessment & Plan Note (Signed)
Repeat bp after resting today very good no changes to therapy.

## 2011-11-17 ENCOUNTER — Other Ambulatory Visit: Payer: Self-pay

## 2011-11-17 MED ORDER — PANTOPRAZOLE SODIUM 40 MG PO TBEC: 40.0000 mg | DELAYED_RELEASE_TABLET | Freq: Every day | ORAL | Status: DC

## 2011-11-17 MED ORDER — LORATADINE 10 MG PO TABS: 10.0000 mg | ORAL_TABLET | Freq: Every day | ORAL | Status: DC

## 2011-11-29 ENCOUNTER — Other Ambulatory Visit: Payer: Self-pay

## 2011-11-29 DIAGNOSIS — M25561 Pain in right knee: Secondary | ICD-10-CM

## 2011-11-29 MED ORDER — MELOXICAM 15 MG PO TABS: 15.0000 mg | ORAL_TABLET | Freq: Every day | ORAL | Status: DC

## 2011-11-29 NOTE — Telephone Encounter (Signed)
RX sent to pharmacy  

## 2011-12-11 ENCOUNTER — Telehealth: Payer: Self-pay | Admitting: Family Medicine

## 2011-12-11 ENCOUNTER — Other Ambulatory Visit (INDEPENDENT_AMBULATORY_CARE_PROVIDER_SITE_OTHER): Payer: PRIVATE HEALTH INSURANCE

## 2011-12-11 DIAGNOSIS — R300 Dysuria: Secondary | ICD-10-CM

## 2011-12-11 DIAGNOSIS — R309 Painful micturition, unspecified: Secondary | ICD-10-CM

## 2011-12-11 DIAGNOSIS — R109 Unspecified abdominal pain: Secondary | ICD-10-CM

## 2011-12-11 LAB — POCT URINALYSIS DIPSTICK
Glucose, UA: NEGATIVE
Spec Grav, UA: 1.02
Urobilinogen, UA: 0.2

## 2011-12-11 MED ORDER — CIPROFLOXACIN HCL 250 MG PO TABS: 250.0000 mg | ORAL_TABLET | Freq: Two times a day (BID) | ORAL | Status: AC

## 2011-12-11 NOTE — Telephone Encounter (Signed)
Per MD patient can come in and leave a sample. I will put pt on lab schedule

## 2011-12-11 NOTE — Telephone Encounter (Signed)
Patient is having urinary frequency and burning while urinating. She has been taking phenazopyridine this weekend and has gotten some relief but she is running out. Can she need an OV or can she just come in and leave a sample to get Rx?

## 2011-12-14 LAB — URINE CULTURE: Colony Count: >=100000

## 2012-01-01 ENCOUNTER — Ambulatory Visit: Payer: PRIVATE HEALTH INSURANCE | Admitting: Family Medicine

## 2012-01-02 ENCOUNTER — Other Ambulatory Visit: Payer: Self-pay

## 2012-01-02 MED ORDER — FAMOTIDINE 20 MG PO TABS: 20.0000 mg | ORAL_TABLET | Freq: Every day | ORAL | Status: DC

## 2012-01-03 ENCOUNTER — Ambulatory Visit (INDEPENDENT_AMBULATORY_CARE_PROVIDER_SITE_OTHER): Payer: PRIVATE HEALTH INSURANCE | Admitting: Family Medicine

## 2012-01-03 ENCOUNTER — Encounter: Payer: Self-pay | Admitting: Family Medicine

## 2012-01-03 VITALS — BP 147/83 | HR 89 | Temp 97.8°F | Ht 63.0 in | Wt 318.0 lb

## 2012-01-03 DIAGNOSIS — R609 Edema, unspecified: Secondary | ICD-10-CM

## 2012-01-03 DIAGNOSIS — J189 Pneumonia, unspecified organism: Secondary | ICD-10-CM

## 2012-01-03 DIAGNOSIS — D75839 Thrombocytosis, unspecified: Secondary | ICD-10-CM

## 2012-01-03 DIAGNOSIS — K219 Gastro-esophageal reflux disease without esophagitis: Secondary | ICD-10-CM

## 2012-01-03 DIAGNOSIS — E039 Hypothyroidism, unspecified: Secondary | ICD-10-CM

## 2012-01-03 DIAGNOSIS — E785 Hyperlipidemia, unspecified: Secondary | ICD-10-CM

## 2012-01-03 DIAGNOSIS — D649 Anemia, unspecified: Secondary | ICD-10-CM

## 2012-01-03 DIAGNOSIS — E669 Obesity, unspecified: Secondary | ICD-10-CM

## 2012-01-03 DIAGNOSIS — I10 Essential (primary) hypertension: Secondary | ICD-10-CM

## 2012-01-03 DIAGNOSIS — M130 Polyarthritis, unspecified: Secondary | ICD-10-CM

## 2012-01-03 DIAGNOSIS — D473 Essential (hemorrhagic) thrombocythemia: Secondary | ICD-10-CM

## 2012-01-03 DIAGNOSIS — M1990 Unspecified osteoarthritis, unspecified site: Secondary | ICD-10-CM

## 2012-01-03 DIAGNOSIS — D72829 Elevated white blood cell count, unspecified: Secondary | ICD-10-CM

## 2012-01-03 DIAGNOSIS — M129 Arthropathy, unspecified: Secondary | ICD-10-CM

## 2012-01-03 DIAGNOSIS — IMO0001 Reserved for inherently not codable concepts without codable children: Secondary | ICD-10-CM

## 2012-01-03 LAB — CBC
MCH: 29.3 pg (ref 26.0–34.0)
Platelets: 461 10*3/uL — ABNORMAL HIGH (ref 150–400)
RBC: 4.6 MIL/uL (ref 3.87–5.11)
WBC: 16.2 10*3/uL — ABNORMAL HIGH (ref 4.0–10.5)

## 2012-01-03 LAB — TSH: TSH: 1.577 u[IU]/mL (ref 0.350–4.500)

## 2012-01-03 MED ORDER — FAMOTIDINE 20 MG PO TABS: 20.0000 mg | ORAL_TABLET | Freq: Every day | ORAL | Status: DC

## 2012-01-03 MED ORDER — TRAMADOL HCL 50 MG PO TABS: 100.0000 mg | ORAL_TABLET | Freq: Two times a day (BID) | ORAL | Status: AC | PRN

## 2012-01-03 MED ORDER — LEVOTHYROXINE SODIUM 100 MCG PO TABS: ORAL_TABLET | ORAL | Status: DC

## 2012-01-03 NOTE — Patient Instructions (Addendum)
Osteoarthritis Osteoarthritis is the most common form of arthritis. It is redness, soreness, and swelling (inflammation) affecting the cartilage. Cartilage acts as a cushion, covering the ends of bones where they meet to form a joint. CAUSES  Over time, the cartilage begins to wear away. This causes bone to rub on bone. This produces pain and stiffness in the affected joints. Factors that contribute to this problem are:  Excessive body weight.   Age.   Overuse of joints.  SYMPTOMS   People with osteoarthritis usually experience joint pain, swelling, or stiffness.   Over time, the joint may lose its normal shape.   Small deposits of bone (osteophytes) may grow on the edges of the joint.   Bits of bone or cartilage can break off and float inside the joint space. This may cause more pain and damage.   Osteoarthritis can lead to depression, anxiety, feelings of helplessness, and limitations on daily activities.  The most commonly affected joints are in the:  Ends of the fingers.   Thumbs.   Neck.   Lower back.   Knees.   Hips.  DIAGNOSIS  Diagnosis is mostly based on your symptoms and exam. Tests may be helpful, including:  X-rays of the affected joint.   A computerized magnetic scan (MRI).   Blood tests to rule out other types of arthritis.   Joint fluid tests. This involves using a needle to draw fluid from the joint and examining the fluid under a microscope.  TREATMENT  Goals of treatment are to control pain, improve joint function, maintain a normal body weight, and maintain a healthy lifestyle. Treatment approaches may include:  A prescribed exercise program with rest and joint relief.   Weight control with nutritional education.   Pain relief techniques such as:   Properly applied heat and cold.   Electric pulses delivered to nerve endings under the skin (transcutaneous electrical nerve stimulation, TENS).   Massage.   Certain supplements. Ask your  caregiver before using any supplements, especially in combination with prescribed drugs.   Medicines to control pain, such as:   Acetaminophen.   Nonsteroidal anti-inflammatory drugs (NSAIDs), such as naproxen.   Narcotic or central-acting agents, such as tramadol. This drug carries a risk of addiction and is generally prescribed for short-term use.   Corticosteroids. These can be given orally or as injection. This is a short-term treatment, not recommended for routine use.   Surgery to reposition the bones and relieve pain (osteotomy) or to remove loose pieces of bone and cartilage. Joint replacement may be needed in advanced states of osteoarthritis.  HOME CARE INSTRUCTIONS  Your caregiver can recommend specific types of exercise. These may include:  Strengthening exercises. These are done to strengthen the muscles that support joints affected by arthritis. They can be performed with weights or with exercise bands to add resistance.   Aerobic activities. These are exercises, such as brisk walking or low-impact aerobics, that get your heart pumping. They can help keep your lungs and circulatory system in shape.   Range-of-motion activities. These keep your joints limber.   Balance and agility exercises. These help you maintain daily living skills.  Learning about your condition and being actively involved in your care will help improve the course of your osteoarthritis. SEEK MEDICAL CARE IF:   You feel hot or your skin turns red.   You develop a rash in addition to your joint pain.   You have an oral temperature above 102 F (38.9 C).  FOR   MORE INFORMATION  National Institute of Arthritis and Musculoskeletal and Skin Diseases: www.niams.nih.gov National Institute on Aging: www.nia.nih.gov American College of Rheumatology: www.rheumatology.org Document Released: 07/10/2005 Document Revised: 06/29/2011 Document Reviewed: 10/21/2009 [employer] Patient Information 2012 [employer]. 

## 2012-01-04 LAB — SEDIMENTATION RATE: Sed Rate: 22 mm/hr (ref 0–22)

## 2012-01-05 NOTE — Assessment & Plan Note (Signed)
Improved

## 2012-01-05 NOTE — Assessment & Plan Note (Signed)
Has worsened despite resolution of pulmonary infections and discontinuation of steroids, will refer to Hematology for further evaluation at this time. Platelets are also trending back up.

## 2012-01-05 NOTE — Assessment & Plan Note (Signed)
improved

## 2012-01-05 NOTE — Assessment & Plan Note (Signed)
Weight has been trending down the past 2 visits, encouraged dash diet and increased exercise

## 2012-01-05 NOTE — Progress Notes (Signed)
[name], female   DOB: Oct 07, 1950, 61 y.o.   MRN: [id number] Rebecca Brooks [id number] 10-13-1950 01/05/2012      Progress Note-Follow Up  Subjective  Chief Complaint  Chief Complaint  Patient presents with  . Follow-up    2 month     HPI  Patient is a 61 year old Caucasian female who's here today for followup. She continues to struggle with pain diffusely. She has myalgias but also multiple joint concerns. Her most painful joints are her right shoulder her knees and hands. She has been seen both rheumatology and physiatry recently. They have ruled out rheumatoid arthritis and diagnosed her with severe osteoarthritis at this time. They have made suggestion that sarcoid should be considered. She will follow physiatry for now and rheumatology less frequently. They have stopped her NSAIDs and she does not her pain did increase when they did that she feels she is adjusting. She's had cortisone shots in several joints including right shoulder and knees since last seen and she did get temporary relief. Physical therapy has been somewhat helpful as well. No chest pain, palpitations. No returning pulmonary concerns such as cough or shortness of breath. No fevers, chills, congestion, GI or GU complaints noted today.  Past Medical History  Diagnosis Date  . Pneumonia     this October from which she has said inhailer  . Obesity   . Chicken pox as a child  . Measles as a child  . Mumps as a child  . Arthritis   . GERD (gastroesophageal reflux disease)   . Hyperlipidemia   . Hypertension   . Hypothyroidism   . Palpitations 08/01/2011  . Anemia 08/01/2011  . Hypokalemia 08/01/2011  . Leukocytosis 08/01/2011  . Low back pain radiating to right leg     intermittent and occasional numbness of skin over right hip in certain positions  . Candidal skin infection 08/11/2011  . UTI (lower urinary tract infection) 09/17/2011  . Knee pain, right 10/18/2011  . Shoulder pain, bilateral 11/01/2011      Past Surgical History  Procedure Date  . Thyroidectomy     total for benign tumor, Parathyroid spared  . Tonsillectomy   . Cesarean section     X 3  . Joint replacement 2006    right hip replacement, secondary to congenital  hip defect  . Hernia repair 61 yrs old    right inguinal    Family History  Problem Relation Age of Onset  . Cancer Mother 14    lung/ smoker  . Heart disease Mother   . Hearing loss Mother     tachycardia  . Mental illness Mother     anxiety, claustrophobia  . Osteoarthritis Father   . Hyperlipidemia Father   . Hyperlipidemia Sister   . Migraines Sister   . Hypertension Sister   . Cancer Maternal Grandmother     colon  . Heart attack Maternal Grandfather   . Aneurysm Paternal Grandfather     abdominal  . Heart disease Paternal Grandfather     AAA rupture, smoker    History   Social History  . Marital Status: Married    Spouse Name: N/A    Number of Children: N/A  . Years of Education: N/A   Occupational History  . Not on file.   Social History Main Topics  . Smoking status: Former Smoker -- 0.5 packs/day for 5 years    Types: Cigarettes    Quit date: 07/25/1971  .  Smokeless tobacco: Never Used  . Alcohol Use: 8.4 oz/week    14 Glasses of wine per week  . Drug Use: No  . Sexually Active: No   Other Topics Concern  . Not on file   Social History Narrative  . No narrative on file    Current Outpatient Prescriptions on File Prior to Visit  Medication Sig Dispense Refill  . acetaminophen (TYLENOL) 500 MG tablet Take 1,000 mg by mouth every 6 (six) hours as needed.      Marland Kitchen allopurinol (ZYLOPRIM) 100 MG tablet Take 1 tablet (100 mg total) by mouth daily.  90 tablet  0  . cholecalciferol (VITAMIN D) 1000 UNITS tablet Take 1,000 Units by mouth daily.      . famotidine (PEPCID) 20 MG tablet Take 1 tablet (20 mg total) by mouth at bedtime.  90 tablet  1  . furosemide (LASIX) 20 MG tablet 1 tablet daily as needed      .  hydrochlorothiazide (HYDRODIURIL) 25 MG tablet Take 1 tablet (25 mg total) by mouth daily.  90 tablet  1  . levothyroxine (SYNTHROID, LEVOTHROID) 100 MCG tablet Needs brand name Synthroid, not responding to generics  90 tablet  1  . loratadine (CLARITIN) 10 MG tablet Take 1 tablet (10 mg total) by mouth daily.  30 tablet  3  . Nebivolol HCl (BYSTOLIC) 20 MG TABS Take 1 tablet (20 mg total) by mouth daily.  90 tablet  1  . pantoprazole (PROTONIX) 40 MG tablet Take 1 tablet (40 mg total) by mouth daily.  90 tablet  0  . simvastatin (ZOCOR) 40 MG tablet Take 1 tablet (40 mg total) by mouth every evening.  90 tablet  1  . DISCONTD: budesonide-formoterol (SYMBICORT) 160-4.5 MCG/ACT inhaler Inhale 2 puffs into the lungs 2 (two) times daily as needed. Patient has sample, is not using  1 Inhaler  0    No Known Allergies  Review of Systems  Review of Systems  Constitutional: Positive for malaise/fatigue. Negative for fever.  HENT: Negative for congestion.   Eyes: Negative for discharge.  Respiratory: Negative for shortness of breath.   Cardiovascular: Negative for chest pain, palpitations and leg swelling.  Gastrointestinal: Negative for nausea, abdominal pain and diarrhea.  Genitourinary: Negative for dysuria.  Musculoskeletal: Positive for myalgias, back pain and joint pain. Negative for falls.       Pain persists in knees, shoulders, hands  Skin: Negative for rash.  Neurological: Negative for loss of consciousness and headaches.  Endo/Heme/Allergies: Negative for polydipsia.  Psychiatric/Behavioral: Negative for depression and suicidal ideas. The patient is not nervous/anxious and does not have insomnia.     Objective  BP 147/83  Pulse 89  Temp 97.8 F (36.6 C) (Temporal)  Ht 5\' 3"  (1.6 m)  Wt 318 lb (144.244 kg)  BMI 56.33 kg/m2  SpO2 90%  Physical Exam  Physical Exam  Constitutional: She is oriented to person, place, and time and well-developed, well-nourished, and in no  distress. No distress.       obese  HENT:  Head: Normocephalic and atraumatic.  Eyes: Conjunctivae are normal.  Neck: Neck supple. No thyromegaly present.  Cardiovascular: Normal rate, regular rhythm and normal heart sounds.   No murmur heard. Pulmonary/Chest: Effort normal and breath sounds normal. She has no wheezes.  Abdominal: She exhibits no distension and no mass.  Musculoskeletal: She exhibits no edema.  Lymphadenopathy:    She has no cervical adenopathy.  Neurological: She is alert and oriented to  person, place, and time.  Skin: Skin is warm and dry. No rash noted. She is not diaphoretic.  Psychiatric: Memory, affect and judgment normal.    Lab Results  Component Value Date   TSH 1.577 01/03/2012   Lab Results  Component Value Date   WBC 16.2* 01/03/2012   HGB 13.5 01/03/2012   HCT 40.7 01/03/2012   MCV 88.5 01/03/2012   PLT 461* 01/03/2012   Lab Results  Component Value Date   CREATININE 0.99 10/18/2011   BUN 20 10/18/2011   NA 141 10/18/2011   K 4.7 10/18/2011   CL 104 10/18/2011   CO2 29 10/18/2011   Lab Results  Component Value Date   ALT 14 07/19/2011   AST 10 07/19/2011   ALKPHOS 78 07/19/2011   BILITOT 0.2* 07/19/2011   Lab Results  Component Value Date   CHOL 192 07/18/2011   Lab Results  Component Value Date   HDL 72 07/18/2011   Lab Results  Component Value Date   LDLCALC 106* 07/18/2011   Lab Results  Component Value Date   TRIG 72 07/18/2011   Lab Results  Component Value Date   CHOLHDL 2.7 07/18/2011     Assessment & Plan  Hypertension Adequately controlled today no changes  Leukocytosis Has worsened despite resolution of pulmonary infections and discontinuation of steroids, will refer to Hematology for further evaluation at this time. Platelets are also trending back up.  Arthritis of multiple sites Continues to struggle with chronic, diffuse pain, has seen both Physiatry and Rheumatology. Has ruled out for Rheumatoid so for now  she will have more frequent visits with Physiatry and use Rheumatology infrequently and as needed. She has had steroid injections in knees and right shoulder since her last visit here and got some short lived relief. Has stopped NSAIDs for now. Pain is increasing.  Anemia Improved   Pneumonia Resolved, rheumatology has suggested we consider sarcoidosis as a diagnosis but presently she is improved. If pulmonary symptoms return may need to consider work up  Obesity (BMI 30-39.9) Weight has been trending down the past 2 visits, encouraged dash diet and increased exercise  Peripheral edema improved  Hyperlipidemia Tolerating Zocor will continue to monitor

## 2012-01-05 NOTE — Assessment & Plan Note (Signed)
Tolerating Zocor will continue to monitor

## 2012-01-05 NOTE — Assessment & Plan Note (Signed)
Continues to struggle with chronic, diffuse pain, has seen both Physiatry and Rheumatology. Has ruled out for Rheumatoid so for now she will have more frequent visits with Physiatry and use Rheumatology infrequently and as needed. She has had steroid injections in knees and right shoulder since her last visit here and got some short lived relief. Has stopped NSAIDs for now. Pain is increasing.

## 2012-01-05 NOTE — Assessment & Plan Note (Signed)
Adequately controlled today no changes. 

## 2012-01-05 NOTE — Assessment & Plan Note (Signed)
Resolved, rheumatology has suggested we consider sarcoidosis as a diagnosis but presently she is improved. If pulmonary symptoms return may need to consider work up

## 2012-01-07 ENCOUNTER — Encounter: Payer: Self-pay | Admitting: Family Medicine

## 2012-01-08 ENCOUNTER — Other Ambulatory Visit: Payer: Self-pay | Admitting: Family Medicine

## 2012-01-08 NOTE — Telephone Encounter (Signed)
Please advise MyChart message

## 2012-01-08 NOTE — Progress Notes (Signed)
Patient sent a my chart note agreeing to Hematology consult for thombocytosis, lekocytosis, will schedule. Order already placed

## 2012-01-12 ENCOUNTER — Telehealth: Payer: Self-pay | Admitting: Oncology

## 2012-01-12 NOTE — Telephone Encounter (Signed)
Called pts home lmovm to rtn call to scheduled appt

## 2012-01-15 ENCOUNTER — Telehealth: Payer: Self-pay | Admitting: Oncology

## 2012-01-15 NOTE — Telephone Encounter (Signed)
Referred by Dr. Danise Edge Dx- leukocytosis/Thrombocytosis

## 2012-01-15 NOTE — Telephone Encounter (Signed)
called pt and scheduled appt for 07/03.  faxed over a letter tp Dr. Abner Greenspan with appt d/t

## 2012-01-17 ENCOUNTER — Other Ambulatory Visit: Payer: Self-pay

## 2012-01-17 MED ORDER — ALLOPURINOL 100 MG PO TABS: 100.0000 mg | ORAL_TABLET | Freq: Every day | ORAL | Status: DC

## 2012-01-22 ENCOUNTER — Other Ambulatory Visit: Payer: Self-pay | Admitting: Oncology

## 2012-01-22 DIAGNOSIS — D473 Essential (hemorrhagic) thrombocythemia: Secondary | ICD-10-CM

## 2012-01-22 DIAGNOSIS — D75839 Thrombocytosis, unspecified: Secondary | ICD-10-CM

## 2012-01-24 ENCOUNTER — Ambulatory Visit: Payer: PRIVATE HEALTH INSURANCE

## 2012-01-24 ENCOUNTER — Telehealth: Payer: Self-pay | Admitting: Oncology

## 2012-01-24 ENCOUNTER — Other Ambulatory Visit (HOSPITAL_BASED_OUTPATIENT_CLINIC_OR_DEPARTMENT_OTHER): Payer: PRIVATE HEALTH INSURANCE | Admitting: Lab

## 2012-01-24 ENCOUNTER — Ambulatory Visit (HOSPITAL_BASED_OUTPATIENT_CLINIC_OR_DEPARTMENT_OTHER): Payer: PRIVATE HEALTH INSURANCE | Admitting: Oncology

## 2012-01-24 VITALS — BP 139/73 | HR 76 | Temp 97.0°F | Ht 63.0 in | Wt 313.2 lb

## 2012-01-24 DIAGNOSIS — D72829 Elevated white blood cell count, unspecified: Secondary | ICD-10-CM

## 2012-01-24 DIAGNOSIS — D75839 Thrombocytosis, unspecified: Secondary | ICD-10-CM

## 2012-01-24 DIAGNOSIS — D473 Essential (hemorrhagic) thrombocythemia: Secondary | ICD-10-CM

## 2012-01-24 LAB — COMPREHENSIVE METABOLIC PANEL
AST: 18 U/L (ref 0–37)
Albumin: 3.9 g/dL (ref 3.5–5.2)
Alkaline Phosphatase: 102 U/L (ref 39–117)
Calcium: 9.5 mg/dL (ref 8.4–10.5)
Chloride: 100 mEq/L (ref 96–112)
Glucose, Bld: 91 mg/dL (ref 70–99)
Potassium: 4.6 mEq/L (ref 3.5–5.3)
Sodium: 138 mEq/L (ref 135–145)
Total Protein: 6.3 g/dL (ref 6.0–8.3)

## 2012-01-24 LAB — CBC WITH DIFFERENTIAL/PLATELET
BASO%: 0.5 % (ref 0.0–2.0)
HCT: 41 % (ref 34.8–46.6)
MCHC: 33.2 g/dL (ref 31.5–36.0)
MONO#: 0.7 10*3/uL (ref 0.1–0.9)
RBC: 4.53 10*6/uL (ref 3.70–5.45)
WBC: 14.5 10*3/uL — ABNORMAL HIGH (ref 3.9–10.3)
lymph#: 2 10*3/uL (ref 0.9–3.3)

## 2012-01-24 NOTE — Progress Notes (Signed)
CC:   Danise Edge, MD  REASON FOR CONSULTATION:  leukocytosis.  HISTORY OF PRESENT ILLNESS:  This is a 61 year old woman, native of [place], currently of [place].  She has lived in multiple locations and moved around with her husband who is a Chartered loss adjuster.  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  She has significant comorbid conditions including obesity, hyperlipidemia and hypothyroidism.  Her illness dates back to November of last year when she presented with symptoms of bronchitis and shortness of breath and was diagnosed initially with bronchitis, treated with oral prednisone as well as antibiotics.  However, her symptoms worsened and she required hospitalization in December and January of 2013.  During that time, she was diagnosed with pneumonia and during that hospitalization on July 16, 2011,  she had a CT scan which showed no evidence of any pulmonary embolus but a patchy airspace opacity in left lower lobe concerning for pneumonia; however, malignancy could not be completely ruled out.  The patient was treated aggressively with antibiotics and steroids and was discharged and follow-up chest x-ray in February of 2013 showed resolution of airspace disease.  She was noted also throughout this period she had leukocytosis.  Her white cell count was as high as 25,000 in January of 2013.  That number went down to as low as 11,000 in March of 2013 and went up to 16,000 on January 03, 2012.  Her platelet count was as high as 473 in January but went down to 440 and most recently was 461.  Clinically she reports some constellation of symptoms that includes arthritic joint pain.  She had been see with Dr. Corliss Skains in the past.  She was not diagnosed with rheumatoid arthritis but really no inflammatory arthritis diagnosis given at that time.  Her sedimentation rate was normal.  She had also reported symptoms of fatigue.  She also had a couple of episodes of  epistaxis.  REVIEW OF SYSTEMS:  Otherwise does not report any headaches, blurry vision, double vision.  She did not report any motor or sensory neuropathy.  She did not report any alteration of mental status.  She did not report any psychiatric issues, depression.  She did not report any fever, chills, sweats.  She did not report any cough, hemoptysis, hematemesis.  No nausea, vomiting, abdominal pain, hematochezia, melena, or genitourinary complaints.  Rest of review of systems unremarkable.  PAST MEDICAL HISTORY:  Significant for obesity, hypertension, hyperlipidemia, hypothyroidism, history of arthritis, history of GERD.  PAST SURGICAL HISTORY:  She is status post thyroidectomy, history of tonsillectomy, cesarean section.  Also had a hip replacement.  FAMILY HISTORY:  Father died in his 48s, mother died in her 2s of lung cancer.  No history of any blood disorders or any other malignancies.  SOCIAL HISTORY:  She is married.  She has 3 sons.  Does not smoke.  Very occasional drinking.  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  PHYSICAL EXAMINATION:  Alert, awake female, appeared in no active distress.  Vital signs:  Blood pressure is 139/73, pulse 96, respirations 20, temperature is 97.  Weight is 313 pounds.  HEENT:  Head is normocephalic, atraumatic.  Pupils equal, round, reactive to light. Oral mucosa moist and pink.  Neck:  Supple without adenopathy.  Heart: Regular rate and rhythm, S1, S2.  Lungs:  Clear to auscultation without rhonchi, wheeze, dullness to percussion.  Abdomen:  Soft, nontender.  No  hepatosplenomegaly.  Extremities:  No clubbing, cyanosis, or edema. Neurological:  Intact motor, sensory, and deep tendon reflexes.  LABORATORY DATA:  Hemoglobin of 13.6, white cell count is 14,000, her platelet count was 372 which is normal.  Her differential showed neutrophil percentage of 80%, lymphocyte  percentage is 13.7.  Peripheral smear was personally reviewed today, did not show any evidence of any dysplasia, no evidence of any immature white cells.  Hemoglobin and platelets all appear normal.  ASSESSMENT AND PLAN:  This is a pleasant 61 year old woman with the following issue:  Leukocytosis:  The differential diagnosis was discussed today in detail with Mrs. Abdulla and her husband.  I have talked to her about secondary causes of leukocytosis, most likely reactive in nature, includes related to recent infection, pneumonia, bronchitis, arthritis or stress and use of steroids.  All of these or combined could definitely be contributing. She also noted that they have discovered mold in their house, which has been under current renovation and evaluation which certainly can cause uncertain inflammatory response that causes leukocytosis.  Primary causes such as myeloproliferative disorder or lymphoproliferative disorder are unlikely, I only see her white cell count getting down.  It did bump up to 16,000 but now is down to 14,000.  I think it is less likely to be a myeloproliferative disorder such as chronic myelogenous leukemia or myelofibrosis or any other myeloproliferative disorders.  I do not think there is a lymphoproliferative disorder here, either.  I do not see any evidence of leukemia or lymphoma.  No evidence of any lymphocytosis on the peripheral smear.  Other reactive causes such as an occult malignancy also unlikely.  She is up-to-speed to her age appropriate cancer screening.  She had a follow-up chest x-ray in February of this year and it makes it less likely this is a pulmonary malignancy.  The plan will be at this point to repeat her blood counts in about 3 months and re-evaluate at that time.  If she has persistent elevation of her white cells or platelets, then I can work her up for a myeloproliferative disorder with JAK2 mutation or BCR/ABL.  All her questions were  answered today.    ______________________________ Benjiman Core, M.D. FNS/MEDQ  D:  01/24/2012  T:  01/24/2012  Job:  [id number]

## 2012-01-24 NOTE — Progress Notes (Signed)
Note dictated

## 2012-01-24 NOTE — Telephone Encounter (Signed)
gv pt appt for 10/4

## 2012-02-01 ENCOUNTER — Other Ambulatory Visit: Payer: Self-pay | Admitting: *Deleted

## 2012-02-01 MED ORDER — VITAMIN D 1000 UNITS PO TABS: 1000.0000 [IU] | ORAL_TABLET | Freq: Every day | ORAL | Status: DC

## 2012-02-01 NOTE — Telephone Encounter (Signed)
Faxed refill request received from pharmacy for Vitamin D Last filled by MD on 07/11/11, #90 x 1 Last seen on 01/03/12 Follow up on 03/04/12 RX sent.

## 2012-02-19 ENCOUNTER — Other Ambulatory Visit: Payer: Self-pay | Admitting: *Deleted

## 2012-02-19 MED ORDER — PANTOPRAZOLE SODIUM 40 MG PO TBEC: 40.0000 mg | DELAYED_RELEASE_TABLET | Freq: Every day | ORAL | Status: DC

## 2012-02-19 MED ORDER — LORATADINE 10 MG PO TABS: 10.0000 mg | ORAL_TABLET | Freq: Every day | ORAL | Status: DC

## 2012-02-19 NOTE — Telephone Encounter (Signed)
Faxed refill request received from pharmacy for LORATADINE Last filled by MD on 11/17/11, #30 X 3 RX SENT.  Faxed refill request received from pharmacy for PANTOPRAZOLE Last filled by MD on 11/17/11, #90 X 0 Last seen on 01/03/12 Follow up 03/04/12 RX SENT.

## 2012-02-20 ENCOUNTER — Ambulatory Visit (INDEPENDENT_AMBULATORY_CARE_PROVIDER_SITE_OTHER): Payer: PRIVATE HEALTH INSURANCE | Admitting: Family Medicine

## 2012-02-20 ENCOUNTER — Encounter: Payer: Self-pay | Admitting: Family Medicine

## 2012-02-20 VITALS — BP 120/76 | HR 100 | Temp 97.1°F | Ht 63.0 in | Wt 309.8 lb

## 2012-02-20 DIAGNOSIS — R040 Epistaxis: Secondary | ICD-10-CM

## 2012-02-20 DIAGNOSIS — I10 Essential (primary) hypertension: Secondary | ICD-10-CM

## 2012-02-20 DIAGNOSIS — R51 Headache: Secondary | ICD-10-CM

## 2012-02-20 DIAGNOSIS — R0981 Nasal congestion: Secondary | ICD-10-CM

## 2012-02-20 DIAGNOSIS — J3489 Other specified disorders of nose and nasal sinuses: Secondary | ICD-10-CM

## 2012-02-20 HISTORY — DX: Epistaxis: R04.0

## 2012-02-20 MED ORDER — MUPIROCIN 2 % EX OINT: TOPICAL_OINTMENT | CUTANEOUS | Status: DC

## 2012-02-20 MED ORDER — LORATADINE 10 MG PO TABS: 10.0000 mg | ORAL_TABLET | Freq: Two times a day (BID) | ORAL | Status: DC

## 2012-02-20 NOTE — Progress Notes (Signed)
[name], female   DOB: 17-Aug-1950, 61 y.o.   MRN: [id number] Rebecca Brooks [id number] May 31, 1951 02/20/2012      Progress Note-Follow Up  Subjective  Chief Complaint  Chief Complaint  Patient presents with  . Epistaxis    7 since may- 4 this week    HPI  Patient is in today for evaluation of epistaxis. She is a 61 year old Caucasian female who denies any previous history of epistaxis. The bleeding always occurs from the same side. She denies any trauma. Has had some headache and nasal congestion. Is also concerned about the black mold that is when her house. She is actually presently having her set 5 episodes of epistaxis old when she's been able to control. No fevers or chills. No chest pain, palpitations, shortness of breath, GI or GU complaints noted  Past Medical History  Diagnosis Date  . Pneumonia     this October from which she has said inhailer  . Obesity   . Chicken pox as a child  . Measles as a child  . Mumps as a child  . Arthritis   . GERD (gastroesophageal reflux disease)   . Hyperlipidemia   . Hypertension   . Hypothyroidism   . Palpitations 08/01/2011  . Anemia 08/01/2011  . Hypokalemia 08/01/2011  . Leukocytosis 08/01/2011  . Low back pain radiating to right leg     intermittent and occasional numbness of skin over right hip in certain positions  . Candidal skin infection 08/11/2011  . UTI (lower urinary tract infection) 09/17/2011  . Knee pain, right 10/18/2011  . Shoulder pain, bilateral 11/01/2011  . Recurrent epistaxis 02/20/2012    Past Surgical History  Procedure Date  . Thyroidectomy     total for benign tumor, Parathyroid spared  . Tonsillectomy   . Cesarean section     X 3  . Joint replacement 2006    right hip replacement, secondary to congenital  hip defect  . Hernia repair 61 yrs old    right inguinal    Family History  Problem Relation Age of Onset  . Cancer Mother 79    lung/ smoker  . Heart disease Mother   . Hearing  loss Mother     tachycardia  . Mental illness Mother     anxiety, claustrophobia  . Osteoarthritis Father   . Hyperlipidemia Father   . Hyperlipidemia Sister   . Migraines Sister   . Hypertension Sister   . Cancer Maternal Grandmother     colon  . Heart attack Maternal Grandfather   . Aneurysm Paternal Grandfather     abdominal  . Heart disease Paternal Grandfather     AAA rupture, smoker    History   Social History  . Marital Status: Married    Spouse Name: N/A    Number of Children: N/A  . Years of Education: N/A   Occupational History  . Not on file.   Social History Main Topics  . Smoking status: Former Smoker -- 0.5 packs/day for 5 years    Types: Cigarettes    Quit date: 07/25/1971  . Smokeless tobacco: Never Used  . Alcohol Use: 8.4 oz/week    14 Glasses of wine per week  . Drug Use: No  . Sexually Active: No   Other Topics Concern  . Not on file   Social History Narrative  . No narrative on file    Current Outpatient Prescriptions on File Prior to Visit  Medication Sig  Dispense Refill  . allopurinol (ZYLOPRIM) 100 MG tablet Take 1 tablet (100 mg total) by mouth daily.  90 tablet  1  . cholecalciferol (VITAMIN D) 1000 UNITS tablet Take 1 tablet (1,000 Units total) by mouth daily.  90 tablet  1  . famotidine (PEPCID) 20 MG tablet Take 1 tablet (20 mg total) by mouth at bedtime.  90 tablet  1  . hydrochlorothiazide (HYDRODIURIL) 25 MG tablet Take 1 tablet (25 mg total) by mouth daily.  90 tablet  1  . levothyroxine (SYNTHROID, LEVOTHROID) 100 MCG tablet Needs brand name Synthroid, not responding to generics  90 tablet  1  . Nebivolol HCl (BYSTOLIC) 20 MG TABS Take 1 tablet (20 mg total) by mouth daily.  90 tablet  1  . pantoprazole (PROTONIX) 40 MG tablet Take 1 tablet (40 mg total) by mouth daily.  90 tablet  3  . simvastatin (ZOCOR) 40 MG tablet Take 1 tablet (40 mg total) by mouth every evening.  90 tablet  1  . DISCONTD: loratadine (CLARITIN) 10 MG  tablet Take 1 tablet (10 mg total) by mouth daily.  30 tablet  11  . furosemide (LASIX) 20 MG tablet 1 tablet daily as needed      . DISCONTD: budesonide-formoterol (SYMBICORT) 160-4.5 MCG/ACT inhaler Inhale 2 puffs into the lungs 2 (two) times daily as needed. Patient has sample, is not using  1 Inhaler  0    No Known Allergies  Review of Systems  Review of Systems  Constitutional: Negative for fever and malaise/fatigue.  HENT: Positive for nosebleeds and congestion.   Eyes: Negative for discharge.  Respiratory: Negative for shortness of breath.   Cardiovascular: Negative for chest pain, palpitations and leg swelling.  Gastrointestinal: Negative for nausea, abdominal pain and diarrhea.  Genitourinary: Negative for dysuria.  Musculoskeletal: Negative for falls.  Skin: Negative for rash.  Neurological: Positive for headaches. Negative for loss of consciousness.  Endo/Heme/Allergies: Negative for polydipsia.  Psychiatric/Behavioral: Negative for depression and suicidal ideas. The patient is not nervous/anxious and does not have insomnia.     Objective  BP 120/76  Pulse 100  Temp 97.1 F (36.2 C) (Temporal)  Ht 5\' 3"  (1.6 m)  Wt 309 lb 12.8 oz (140.524 kg)  BMI 54.88 kg/m2  SpO2 96%  Physical Exam  Physical Exam  Constitutional: She is oriented to person, place, and time and well-developed, well-nourished, and in no distress. No distress.  HENT:  Head: Normocephalic and atraumatic.  Eyes: Conjunctivae are normal.  Neck: Neck supple. No thyromegaly present.  Cardiovascular: Normal rate, regular rhythm and normal heart sounds.   No murmur heard. Pulmonary/Chest: Effort normal and breath sounds normal. She has no wheezes.  Abdominal: She exhibits no distension and no mass.  Musculoskeletal: She exhibits no edema.  Lymphadenopathy:    She has no cervical adenopathy.  Neurological: She is alert and oriented to person, place, and time.  Skin: Skin is warm and dry. No rash  noted. She is not diaphoretic.  Psychiatric: Memory, affect and judgment normal.    Lab Results  Component Value Date   TSH 1.577 01/03/2012   Lab Results  Component Value Date   WBC 14.5* 01/24/2012   HGB 13.6 01/24/2012   HCT 41.0 01/24/2012   MCV 90.5 01/24/2012   PLT 370 01/24/2012   Lab Results  Component Value Date   CREATININE 0.80 01/24/2012   BUN 12 01/24/2012   NA 138 01/24/2012   K 4.6 01/24/2012   CL 100  01/24/2012   CO2 30 01/24/2012   Lab Results  Component Value Date   ALT 19 01/24/2012   AST 18 01/24/2012   ALKPHOS 102 01/24/2012   BILITOT 0.6 01/24/2012   Lab Results  Component Value Date   CHOL 192 07/18/2011   Lab Results  Component Value Date   HDL 72 07/18/2011   Lab Results  Component Value Date   LDLCALC 106* 07/18/2011   Lab Results  Component Value Date   TRIG 72 07/18/2011   Lab Results  Component Value Date   CHOLHDL 2.7 07/18/2011     Assessment & Plan  Hypertension Well controlled, no changes  Recurrent epistaxis Sinus congestion and headache. Check a CT sinus, started on Bactroban in nares. Referred to ENT for further intervention

## 2012-02-20 NOTE — Patient Instructions (Addendum)

## 2012-02-20 NOTE — Assessment & Plan Note (Signed)
Sinus congestion and headache. Check a CT sinus, started on Bactroban in nares. Referred to ENT for further intervention

## 2012-02-20 NOTE — Assessment & Plan Note (Signed)
Well controlled, no changes 

## 2012-02-21 ENCOUNTER — Ambulatory Visit (HOSPITAL_BASED_OUTPATIENT_CLINIC_OR_DEPARTMENT_OTHER)
Admission: RE | Admit: 2012-02-21 | Discharge: 2012-02-21 | Disposition: A | Discharge status: Home / Self Care | Payer: PRIVATE HEALTH INSURANCE | Source: Ambulatory Visit | Attending: Family Medicine | Admitting: Family Medicine

## 2012-02-21 DIAGNOSIS — R040 Epistaxis: Secondary | ICD-10-CM

## 2012-02-21 DIAGNOSIS — R0981 Nasal congestion: Secondary | ICD-10-CM

## 2012-02-21 DIAGNOSIS — J3489 Other specified disorders of nose and nasal sinuses: Secondary | ICD-10-CM | POA: Insufficient documentation

## 2012-02-21 DIAGNOSIS — R51 Headache: Secondary | ICD-10-CM

## 2012-03-01 ENCOUNTER — Other Ambulatory Visit: Payer: Self-pay

## 2012-03-01 MED ORDER — LORATADINE 10 MG PO TABS: 10.0000 mg | ORAL_TABLET | Freq: Every day | ORAL | Status: DC

## 2012-03-04 ENCOUNTER — Ambulatory Visit: Payer: PRIVATE HEALTH INSURANCE | Admitting: Family Medicine

## 2012-03-04 DIAGNOSIS — Z0289 Encounter for other administrative examinations: Secondary | ICD-10-CM

## 2012-03-13 ENCOUNTER — Other Ambulatory Visit: Payer: Self-pay

## 2012-03-13 MED ORDER — HYDROCHLOROTHIAZIDE 25 MG PO TABS: 25.0000 mg | ORAL_TABLET | Freq: Every day | ORAL | Status: DC

## 2012-04-01 ENCOUNTER — Other Ambulatory Visit: Payer: Self-pay

## 2012-04-01 DIAGNOSIS — E785 Hyperlipidemia, unspecified: Secondary | ICD-10-CM

## 2012-04-01 MED ORDER — PANTOPRAZOLE SODIUM 40 MG PO TBEC: 40.0000 mg | DELAYED_RELEASE_TABLET | Freq: Every day | ORAL | Status: DC

## 2012-04-01 MED ORDER — SIMVASTATIN 40 MG PO TABS: 40.0000 mg | ORAL_TABLET | Freq: Every evening | ORAL | Status: DC

## 2012-04-18 ENCOUNTER — Other Ambulatory Visit: Payer: Self-pay | Admitting: *Deleted

## 2012-04-18 MED ORDER — NEBIVOLOL HCL 20 MG PO TABS: 1.0000 | ORAL_TABLET | Freq: Every day | ORAL | Status: DC

## 2012-04-26 ENCOUNTER — Ambulatory Visit (HOSPITAL_BASED_OUTPATIENT_CLINIC_OR_DEPARTMENT_OTHER): Payer: PRIVATE HEALTH INSURANCE | Admitting: Oncology

## 2012-04-26 ENCOUNTER — Other Ambulatory Visit (HOSPITAL_BASED_OUTPATIENT_CLINIC_OR_DEPARTMENT_OTHER): Payer: PRIVATE HEALTH INSURANCE | Admitting: Lab

## 2012-04-26 VITALS — BP 144/83 | HR 73 | Temp 97.7°F | Resp 20 | Ht 63.0 in | Wt 313.2 lb

## 2012-04-26 DIAGNOSIS — D72829 Elevated white blood cell count, unspecified: Secondary | ICD-10-CM

## 2012-04-26 LAB — CBC WITH DIFFERENTIAL/PLATELET
Basophils Absolute: 0.1 10*3/uL (ref 0.0–0.1)
Eosinophils Absolute: 0.2 10*3/uL (ref 0.0–0.5)
HCT: 40.2 % (ref 34.8–46.6)
HGB: 13.2 g/dL (ref 11.6–15.9)
MONO#: 0.7 10*3/uL (ref 0.1–0.9)
NEUT#: 8.6 10*3/uL — ABNORMAL HIGH (ref 1.5–6.5)
NEUT%: 73.1 % (ref 38.4–76.8)
RDW: 14.1 % (ref 11.2–14.5)
WBC: 11.8 10*3/uL — ABNORMAL HIGH (ref 3.9–10.3)
lymph#: 2.2 10*3/uL (ref 0.9–3.3)

## 2012-04-26 NOTE — Progress Notes (Signed)
Hematology and Oncology Follow Up Visit  Rebecca Brooks [id number] [birth year] 61 y.o. 04/26/2012 12:06 PM   Principle Diagnosis: 61 year old woman with eukocytosis: The differential diagnosis include reactive process due to sinusitis/ bronchitis.    Interim History:  Rebecca Brooks presents today for a follow up visit. She is a nice women I saw on 7/3 for leukocytosis since 07/2011. She was admitted with pneumonia at that time and she was noted to have leukocytosis since that time. Her WBC has been declining since that time. She reports feeling since her last visit. No new complaints, no recent hospitalizations or illness.   Medications: I have reviewed the patient's current medications. Current outpatient prescriptions:allopurinol (ZYLOPRIM) 100 MG tablet, Take 1 tablet (100 mg total) by mouth daily., Disp: 90 tablet, Rfl: 1;  cholecalciferol (VITAMIN D) 1000 UNITS tablet, Take 1 tablet (1,000 Units total) by mouth daily., Disp: 90 tablet, Rfl: 1;  famotidine (PEPCID) 20 MG tablet, Take 1 tablet (20 mg total) by mouth at bedtime., Disp: 90 tablet, Rfl: 1;  furosemide (LASIX) 20 MG tablet, 1 tablet daily as needed, Disp: , Rfl:  hydrochlorothiazide (HYDRODIURIL) 25 MG tablet, Take 1 tablet (25 mg total) by mouth daily., Disp: 90 tablet, Rfl: 1;  levothyroxine (SYNTHROID, LEVOTHROID) 100 MCG tablet, Needs brand name Synthroid, not responding to generics, Disp: 90 tablet, Rfl: 1;  loratadine (CLARITIN) 10 MG tablet, Take 1 tablet (10 mg total) by mouth daily., Disp: 30 tablet, Rfl: 3 mupirocin ointment (BACTROBAN) 2 %, Apply to nares bid with clean qtip x 7days, Disp: 22 g, Rfl: 0;  Nebivolol HCl (BYSTOLIC) 20 MG TABS, Take 1 tablet (20 mg total) by mouth daily., Disp: 90 tablet, Rfl: 0;  pantoprazole (PROTONIX) 40 MG tablet, Take 1 tablet (40 mg total) by mouth daily., Disp: 90 tablet, Rfl: 1;  simvastatin (ZOCOR) 40 MG tablet, Take 1 tablet (40 mg total) by mouth every evening., Disp: 90 tablet, Rfl:  1 traMADol (ULTRAM) 50 MG tablet, Take 50 mg by mouth 2 (two) times daily., Disp: , Rfl: ;  DISCONTD: budesonide-formoterol (SYMBICORT) 160-4.5 MCG/ACT inhaler, Inhale 2 puffs into the lungs 2 (two) times daily as needed. Patient has sample, is not using, Disp: 1 Inhaler, Rfl: 0  Allergies: No Known Allergies  Past Medical History, Surgical history, Social history, and Family History were reviewed and updated.  Review of Systems: Constitutional:  Negative for fever, chills, night sweats, anorexia, weight loss, pain. Cardiovascular: no chest pain or dyspnea on exertion Respiratory: no cough, shortness of breath, or wheezing Neurological: negative Dermatological: negative ENT: negative Skin: Negative. Gastrointestinal: no abdominal pain, change in bowel habits, or black or bloody stools Genito-Urinary: negative Hematological and Lymphatic: negative Breast: negative Musculoskeletal: negative Remaining ROS negative. Physical Exam: Blood pressure 144/83, pulse 73, temperature 97.7 F (36.5 C), temperature source Oral, resp. rate 20, height 5\' 3"  (1.6 m), weight 313 lb 3.2 oz (142.067 kg). ECOG: 0 General appearance: alert Head: Normocephalic, without obvious abnormality, atraumatic Neck: no adenopathy, no carotid bruit, no JVD, supple, symmetrical, trachea midline and thyroid not enlarged, symmetric, no tenderness/mass/nodules Lymph nodes: Cervical, supraclavicular, and axillary nodes normal. Heart:regular rate and rhythm, S1, S2 normal, no murmur, click, rub or gallop Lung:chest clear, no wheezing, rales, normal symmetric air entry Abdomin: soft, non-tender, without masses or organomegaly EXT:no erythema, induration, or nodules   Lab Results: Lab Results  Component Value Date   WBC 11.8* 04/26/2012   HGB 13.2 04/26/2012   HCT 40.2 04/26/2012   MCV 92.4 04/26/2012  PLT 386 04/26/2012     Chemistry      Component Value Date/Time   NA 138 01/24/2012 1342   K 4.6 01/24/2012 1342   CL  100 01/24/2012 1342   CO2 30 01/24/2012 1342   BUN 12 01/24/2012 1342   CREATININE 0.80 01/24/2012 1342   CREATININE 0.99 10/18/2011 1356      Component Value Date/Time   CALCIUM 9.5 01/24/2012 1342   ALKPHOS 102 01/24/2012 1342   AST 18 01/24/2012 1342   ALT 19 01/24/2012 1342   BILITOT 0.6 01/24/2012 1342      Impression and Plan:  This is a pleasant 61 year old woman with the Leukocytosis: The differential diagnosis was discussed today in detail with Rebecca Brooks again. I have talked to This is most likely due to secondary causes of leukocytosis, most likely reactive in nature, includes related to recent infection, pneumonia, bronchitis, arthritis or stress and use of steroids. All of these or combined could definitely be contributing. Given that her WBC is down to 11.8 with normal diff, this is most likely reactive in nature.  I doubt this is related to a hematological condition such as CML or any other MPD. I doubt this is related to an occult cancer, she have has multiple imaging studies to rule that out in the last year.  No further intervention is needed.  I recommend continue to monitor her CBC annually , and I would be happy to see her again in the future as needed.    [facility], MD 10/4/201312:06 PM

## 2012-05-02 ENCOUNTER — Encounter: Payer: Self-pay | Admitting: Family Medicine

## 2012-05-02 ENCOUNTER — Ambulatory Visit (INDEPENDENT_AMBULATORY_CARE_PROVIDER_SITE_OTHER): Payer: PRIVATE HEALTH INSURANCE | Admitting: Family Medicine

## 2012-05-02 ENCOUNTER — Telehealth: Payer: Self-pay | Admitting: Family Medicine

## 2012-05-02 VITALS — BP 124/80 | HR 96 | Temp 98.0°F | Ht 62.75 in | Wt 309.4 lb

## 2012-05-02 DIAGNOSIS — R21 Rash and other nonspecific skin eruption: Secondary | ICD-10-CM

## 2012-05-02 MED ORDER — PREDNISONE 10 MG PO TABS: ORAL_TABLET | ORAL | Status: DC

## 2012-05-02 MED ORDER — METHYLPREDNISOLONE ACETATE 80 MG/ML IJ SUSP
80.0000 mg | Freq: Once | INTRAMUSCULAR | Status: AC
  Administered 2012-05-02: 80 mg via INTRAMUSCULAR

## 2012-05-02 NOTE — Telephone Encounter (Signed)
Patient calling, has splotches on the back of both legs, red and warm to touch.  No weeping from the skin.  No fever.  It is uncomfortable for the bed linens to touch her legs.   She had sand flea bites on her legs after a trip to the beach.  Had severe itching for > 10 days.  Triaged per Leg Non-Injury.  Needs to be seen in 4 hours. PLEASE CALL , pt. needs to be seen in 4 hours.

## 2012-05-02 NOTE — Telephone Encounter (Signed)
Patient is going to see Dr Beverely Low at 1:15 pm

## 2012-05-02 NOTE — Patient Instructions (Addendum)
Start the Prednisone tomorrow We'll let you know about your derm appt Call with any questions or concerns Hang in there!!!

## 2012-05-02 NOTE — Progress Notes (Signed)
  Subjective:    [name], female    DOB: Jun 25, 1951, 61 y.o.   MRN: [id number]  HPI Rash- got sand flea bites at beach 3 weeks ago.  'i had a lot of bites'.  Itching lasted for 10 days.  Went to derm for skin bx elsewhere and was given meds for the itching.  Now feeling that legs are 'hot in places' and 'tender to touch'.  Was uncomfortable sleeping last night.  Felt better when touching cool sheets.  Legs now appear red/purple.  No new detergents, soaps, lotions.  No recent period of inactivity.   Review of Systems For ROS see HPI     Objective:   Physical Exam  Vitals reviewed. Constitutional: She appears well-developed and well-nourished. No distress.       Morbidly obese  Musculoskeletal: She exhibits edema (bilateral LEs).  Skin: Skin is warm and dry. There is erythema (bilateral LE discoloration (purple appearing) w/ reticular erythema.  pink, erythematous areas are only areas of tenderness).          Assessment & Plan:

## 2012-05-07 NOTE — Assessment & Plan Note (Addendum)
New.  Unclear as to cause of pt's discomfort.  PE not consistent w/ DVT- bilateral, reticular appearing erythema.  Consulted colleague and were in agreement that derm c/s required.  Depo-medrol injxn given in office for pain/rash, pt to start prednisone in AM.  Reviewed need to closely monitor CBGs while on steroids.  Reviewed supportive care and red flags that should prompt return.  Pt expressed understanding and is in agreement w/ plan.

## 2012-05-21 ENCOUNTER — Other Ambulatory Visit: Payer: Self-pay

## 2012-05-21 MED ORDER — LORATADINE 10 MG PO TABS: 10.0000 mg | ORAL_TABLET | Freq: Every day | ORAL | Status: DC

## 2012-07-11 ENCOUNTER — Other Ambulatory Visit: Payer: Self-pay | Admitting: Family Medicine

## 2012-07-19 ENCOUNTER — Ambulatory Visit (INDEPENDENT_AMBULATORY_CARE_PROVIDER_SITE_OTHER): Payer: PRIVATE HEALTH INSURANCE

## 2012-07-19 DIAGNOSIS — Z23 Encounter for immunization: Secondary | ICD-10-CM

## 2012-07-23 ENCOUNTER — Telehealth: Payer: Self-pay | Admitting: Family Medicine

## 2012-07-23 MED ORDER — ZOSTER VACCINE LIVE 19400 UNT/0.65ML ~~LOC~~ SOLR: 0.6500 mL | Freq: Once | SUBCUTANEOUS | Status: DC

## 2012-07-23 NOTE — Telephone Encounter (Signed)
Verbal per Dr. Milinda Cave OK to send RX to pharmacy.  Pt notified.  Rx sent.

## 2012-08-05 ENCOUNTER — Other Ambulatory Visit: Payer: Self-pay | Admitting: Family Medicine

## 2012-08-08 ENCOUNTER — Telehealth: Payer: Self-pay

## 2012-08-08 NOTE — Telephone Encounter (Signed)
Patient left a message stating that she has ran out of her Bystolic on Saturday. Pt states she is waiting for MD to fill out her PA paperwork.   I called pt and informed her that we sent the paperwork on 08-07-12 and we are awaiting there response. I also informed pt to call the pharmacy to see if they will give her a few days worth and if they don't to give me a call back. We don't have any Bystolic samples.   Pt voiced understanding and states she will call the pharmacy

## 2012-08-09 ENCOUNTER — Telehealth: Payer: Self-pay

## 2012-08-09 NOTE — Telephone Encounter (Signed)
Patient states she has tried Quinapril and developed a cough. Amlodipine caused swelling.

## 2012-08-14 MED ORDER — CARVEDILOL 6.25 MG PO TABS: 6.2500 mg | ORAL_TABLET | Freq: Two times a day (BID) | ORAL | Status: DC

## 2012-08-14 NOTE — Addendum Note (Signed)
Addended by: Court Joy on: 08/14/2012 02:41 PM   Modules accepted: Orders

## 2012-08-14 NOTE — Telephone Encounter (Signed)
Which BP medication do you want pt to try? You listed on the Bystolic denial paperwork preferred list Atenolol, Carvedilol, Metoprolol, or Propranolol? Please advise?

## 2012-08-14 NOTE — Telephone Encounter (Signed)
Try Carvedilol 6.25 mg po bid and have her come in in 3 weeks for bp check. Disp #60

## 2012-09-03 ENCOUNTER — Ambulatory Visit (INDEPENDENT_AMBULATORY_CARE_PROVIDER_SITE_OTHER): Payer: BC Managed Care – PPO | Admitting: Family Medicine

## 2012-09-03 ENCOUNTER — Encounter: Payer: Self-pay | Admitting: Family Medicine

## 2012-09-03 VITALS — BP 158/86 | HR 119 | Temp 97.6°F | Ht 63.0 in | Wt 319.0 lb

## 2012-09-03 DIAGNOSIS — Z124 Encounter for screening for malignant neoplasm of cervix: Secondary | ICD-10-CM

## 2012-09-03 DIAGNOSIS — I10 Essential (primary) hypertension: Secondary | ICD-10-CM

## 2012-09-03 DIAGNOSIS — M129 Arthropathy, unspecified: Secondary | ICD-10-CM

## 2012-09-03 DIAGNOSIS — R609 Edema, unspecified: Secondary | ICD-10-CM

## 2012-09-03 DIAGNOSIS — K219 Gastro-esophageal reflux disease without esophagitis: Secondary | ICD-10-CM

## 2012-09-03 DIAGNOSIS — M130 Polyarthritis, unspecified: Secondary | ICD-10-CM

## 2012-09-03 DIAGNOSIS — R252 Cramp and spasm: Secondary | ICD-10-CM

## 2012-09-03 DIAGNOSIS — E039 Hypothyroidism, unspecified: Secondary | ICD-10-CM

## 2012-09-03 LAB — RENAL FUNCTION PANEL
BUN: 14 mg/dL (ref 6–23)
Chloride: 100 mEq/L (ref 96–112)
Glucose, Bld: 105 mg/dL — ABNORMAL HIGH (ref 70–99)
Phosphorus: 3.7 mg/dL (ref 2.3–4.6)
Potassium: 4.5 mEq/L (ref 3.5–5.3)
Sodium: 141 mEq/L (ref 135–145)

## 2012-09-03 MED ORDER — POTASSIUM CHLORIDE CRYS ER 20 MEQ PO TBCR: 20.0000 meq | EXTENDED_RELEASE_TABLET | Freq: Every day | ORAL | Status: DC | PRN

## 2012-09-03 MED ORDER — FUROSEMIDE 20 MG PO TABS: 20.0000 mg | ORAL_TABLET | Freq: Every day | ORAL | Status: DC | PRN

## 2012-09-03 MED ORDER — METOPROLOL TARTRATE 25 MG PO TABS: 25.0000 mg | ORAL_TABLET | Freq: Two times a day (BID) | ORAL | Status: DC

## 2012-09-03 NOTE — Patient Instructions (Addendum)
Call with any concerns regarding hi bp, watch for constipation, fatigue Start a probiotic such as Digestive Advantage daily  Hypertension As your heart beats, it forces blood through your arteries. This force is your blood pressure. If the pressure is too high, it is called hypertension (HTN) or high blood pressure. HTN is dangerous because you may have it and not know it. High blood pressure may mean that your heart has to work harder to pump blood. Your arteries may be narrow or stiff. The extra work puts you at risk for heart disease, stroke, and other problems.  Blood pressure consists of two numbers, a higher number over a lower, 110/72, for example. It is stated as "110 over 72." The ideal is below 120 for the top number (systolic) and under 80 for the bottom (diastolic). Write down your blood pressure today. You should pay close attention to your blood pressure if you have certain conditions such as:  Heart failure.  Prior heart attack.  Diabetes  Chronic kidney disease.  Prior stroke.  Multiple risk factors for heart disease. To see if you have HTN, your blood pressure should be measured while you are seated with your arm held at the level of the heart. It should be measured at least twice. A one-time elevated blood pressure reading (especially in the Emergency Department) does not mean that you need treatment. There may be conditions in which the blood pressure is different between your right and left arms. It is important to see your caregiver soon for a recheck. Most people have essential hypertension which means that there is not a specific cause. This type of high blood pressure may be lowered by changing lifestyle factors such as:  Stress.  Smoking.  Lack of exercise.  Excessive weight.  Drug/tobacco/alcohol use.  Eating less salt. Most people do not have symptoms from high blood pressure until it has caused damage to the body. Effective treatment can often prevent,  delay or reduce that damage. TREATMENT  When a cause has been identified, treatment for high blood pressure is directed at the cause. There are a large number of medications to treat HTN. These fall into several categories, and your caregiver will help you select the medicines that are best for you. Medications may have side effects. You should review side effects with your caregiver. If your blood pressure stays high after you have made lifestyle changes or started on medicines,   Your medication(s) may need to be changed.  Other problems may need to be addressed.  Be certain you understand your prescriptions, and know how and when to take your medicine.  Be sure to follow up with your caregiver within the time frame advised (usually within two weeks) to have your blood pressure rechecked and to review your medications.  If you are taking more than one medicine to lower your blood pressure, make sure you know how and at what times they should be taken. Taking two medicines at the same time can result in blood pressure that is too low. SEEK IMMEDIATE MEDICAL CARE IF:  You develop a severe headache, blurred or changing vision, or confusion.  You have unusual weakness or numbness, or a faint feeling.  You have severe chest or abdominal pain, vomiting, or breathing problems. MAKE SURE YOU:   Understand these instructions.  Will watch your condition.  Will get help right away if you are not doing well or get worse. Document Released: 07/10/2005 Document Revised: 10/02/2011 Document Reviewed: 02/28/2008 [employer] Patient  Information 2013 [place], [place].

## 2012-09-06 NOTE — Progress Notes (Signed)
[name], female   DOB: 10-20-1950, 62 y.o.   MRN: [id number] Rebecca Brooks [id number] [birth year] 09/06/2012      Progress Note-Follow Up  Subjective  Chief Complaint  Chief Complaint  Patient presents with  . Follow-up    review medications    HPI  Patient is a 62 year old Caucasian female who is in today for followup. She continues to struggle with diffuse pain. Complaints of edema in her hands and feet and is following with rheumatology. Also has significant joint pain most notably in her knees. Left worse than right. 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 She has had Synvisc injections with minimal improvement. No other acute complaints. No chest pain, palpitations, recent illness, shortness of breath, GI or GU complaints noted.  Past Medical History  Diagnosis Date  . Pneumonia     this October from which she has said inhailer  . Obesity   . Chicken pox as a child  . Measles as a child  . Mumps as a child  . Arthritis   . GERD (gastroesophageal reflux disease)   . Hyperlipidemia   . Hypertension   . Hypothyroidism   . Palpitations 08/01/2011  . Anemia 08/01/2011  . Hypokalemia 08/01/2011  . Leukocytosis 08/01/2011  . Low back pain radiating to right leg     intermittent and occasional numbness of skin over right hip in certain positions  . Candidal skin infection 08/11/2011  . UTI (lower urinary tract infection) 09/17/2011  . Knee pain, right 10/18/2011  . Shoulder pain, bilateral 11/01/2011  . Recurrent epistaxis 02/20/2012    Past Surgical History  Procedure Laterality Date  . Thyroidectomy      total for benign tumor, Parathyroid spared  . Tonsillectomy    . Cesarean section      X 3  . Joint replacement  2006    right hip replacement, secondary to congenital  hip defect  . Hernia repair  62 yrs old    right inguinal    Family History  Problem Relation Age of Onset  . Cancer Mother 11    lung/ smoker  . Heart  disease Mother   . Hearing loss Mother     tachycardia  . Mental illness Mother     anxiety, claustrophobia  . Osteoarthritis Father   . Hyperlipidemia Father   . Hyperlipidemia Sister   . Migraines Sister   . Hypertension Sister   . Cancer Maternal Grandmother     colon  . Heart attack Maternal Grandfather   . Aneurysm Paternal Grandfather     abdominal  . Heart disease Paternal Grandfather     AAA rupture, smoker    History   Social History  . Marital Status: Married    Spouse Name: N/A    Number of Children: N/A  . Years of Education: N/A   Occupational History  . Not on file.   Social History Main Topics  . Smoking status: Former Smoker -- 0.50 packs/day for 5 years    Types: Cigarettes    Quit date: 07/25/1971  . Smokeless tobacco: Never Used  . Alcohol Use: 8.4 oz/week    14 Glasses of wine per week  . Drug Use: No  . Sexually Active: No   Other Topics Concern  . Not on file   Social History Narrative  . No narrative on file    Current Outpatient Prescriptions on File Prior to Visit  Medication  Sig Dispense Refill  . allopurinol (ZYLOPRIM) 100 MG tablet TAKE 1 TABLET BY MOUTH ONCE DAILY  90 tablet  1  . [employer] VITAMIN D3 1000 UNITS capsule TAKE 1 CAPSULE BY MOUTH ONCE DAILY  90 capsule  1  . famotidine (PEPCID) 20 MG tablet Take 1 tablet (20 mg total) by mouth at bedtime.  90 tablet  1  . hydrochlorothiazide (HYDRODIURIL) 25 MG tablet Take 1 tablet (25 mg total) by mouth daily.  90 tablet  1  . loratadine (CLARITIN) 10 MG tablet Take 1 tablet (10 mg total) by mouth daily.  90 tablet  1  . pantoprazole (PROTONIX) 40 MG tablet Take 1 tablet (40 mg total) by mouth daily.  90 tablet  1  . simvastatin (ZOCOR) 40 MG tablet Take 1 tablet (40 mg total) by mouth every evening.  90 tablet  1  . SYNTHROID 100 MCG tablet TAKE AS DIRECTED  90 tablet  0  . traMADol (ULTRAM) 50 MG tablet Take 50 mg by mouth 2 (two) times daily.      . [DISCONTINUED] budesonide-formoterol  (SYMBICORT) 160-4.5 MCG/ACT inhaler Inhale 2 puffs into the lungs 2 (two) times daily as needed. Patient has sample, is not using  1 Inhaler  0   No current facility-administered medications on file prior to visit.    Allergies  Allergen Reactions  . Accupril (Quinapril Hcl) Cough    Review of Systems  Review of Systems  Constitutional: Positive for malaise/fatigue. Negative for fever and chills.  HENT: Negative for hearing loss, nosebleeds and congestion.   Eyes: Negative for discharge.  Respiratory: Negative for cough, sputum production, shortness of breath and wheezing.   Cardiovascular: Negative for chest pain, palpitations and leg swelling.  Gastrointestinal: Negative for heartburn, nausea, vomiting, abdominal pain, diarrhea, constipation and blood in stool.  Genitourinary: Negative for dysuria, urgency, frequency and hematuria.  Musculoskeletal: Positive for joint pain. Negative for myalgias, back pain and falls.  Skin: Negative for rash.  Neurological: Negative for dizziness, tremors, sensory change, focal weakness, loss of consciousness, weakness and headaches.  Endo/Heme/Allergies: Negative for polydipsia. Does not bruise/bleed easily.  Psychiatric/Behavioral: Negative for depression and suicidal ideas. The patient is not nervous/anxious and does not have insomnia.     Objective  BP 158/86  Pulse 119  Temp(Src) 97.6 F (36.4 C) (Oral)  Ht 5\' 3"  (1.6 m)  Wt 319 lb (144.697 kg)  BMI 56.52 kg/m2  SpO2 90%  Physical Exam  Physical Exam  Constitutional: She is oriented to person, place, and time and well-developed, well-nourished, and in no distress. No distress.  HENT:  Head: Normocephalic and atraumatic.  Eyes: Conjunctivae are normal.  Neck: Neck supple. No thyromegaly present.  Cardiovascular: Normal rate, regular rhythm and normal heart sounds.   No murmur heard. Pulmonary/Chest: Effort normal and breath sounds normal. She has no wheezes.  Abdominal: She  exhibits no distension and no mass.  Musculoskeletal: She exhibits no edema.  Lymphadenopathy:    She has no cervical adenopathy.  Neurological: She is alert and oriented to person, place, and time.  Skin: Skin is warm and dry. No rash noted. She is not diaphoretic.  Psychiatric: Memory, affect and judgment normal.    Lab Results  Component Value Date   TSH 1.577 01/03/2012   Lab Results  Component Value Date   WBC 11.8* 04/26/2012   HGB 13.2 04/26/2012   HCT 40.2 04/26/2012   MCV 92.4 04/26/2012   PLT 386 04/26/2012   Lab Results  Component Value Date   CREATININE 0.73 09/03/2012   BUN 14 09/03/2012   NA 141 09/03/2012   K 4.5 09/03/2012   CL 100 09/03/2012   CO2 32 09/03/2012   Lab Results  Component Value Date   ALT 19 01/24/2012   AST 18 01/24/2012   ALKPHOS 102 01/24/2012   BILITOT 0.6 01/24/2012   Lab Results  Component Value Date   CHOL 192 07/18/2011   Lab Results  Component Value Date   HDL 72 07/18/2011   Lab Results  Component Value Date   LDLCALC 106* 07/18/2011   Lab Results  Component Value Date   TRIG 72 07/18/2011   Lab Results  Component Value Date   CHOLHDL 2.7 07/18/2011     Assessment & Plan  Hypertension poopoorly controlled on Coreg and an increase in constipation. Will try switching to Metoprolol XL.   Arthritis of multiple sites Has not responded to synvisc. Encouraged to stay as active as tolerated. Attempt modest weight loss.   Hypothyroidism Stable on current dose of Synthroid.   GERD (gastroesophageal reflux disease) Adequately controled on Pepcid. Avoid offending foods.

## 2012-09-06 NOTE — Assessment & Plan Note (Signed)
Adequately controled on Pepcid. Avoid offending foods.

## 2012-09-06 NOTE — Assessment & Plan Note (Signed)
poopoorly controlled on Coreg and an increase in constipation. Will try switching to Metoprolol XL.

## 2012-09-06 NOTE — Assessment & Plan Note (Signed)
Has not responded to synvisc. Encouraged to stay as active as tolerated. Attempt modest weight loss.

## 2012-09-06 NOTE — Assessment & Plan Note (Signed)
Stable on current dose of Synthroid. 

## 2012-09-19 ENCOUNTER — Other Ambulatory Visit: Payer: Self-pay | Admitting: Family Medicine

## 2012-10-11 ENCOUNTER — Other Ambulatory Visit: Payer: Self-pay | Admitting: Family Medicine

## 2012-10-11 NOTE — Telephone Encounter (Signed)
Rx request to pharmacy/[facility]  

## 2012-10-30 ENCOUNTER — Encounter: Payer: Self-pay | Admitting: Family Medicine

## 2012-10-30 NOTE — Telephone Encounter (Signed)
I don't see on last AVS that you wanted labs before appt? Please advise mychart question?

## 2012-10-31 ENCOUNTER — Telehealth: Payer: Self-pay

## 2012-10-31 NOTE — Telephone Encounter (Signed)
Left a detailed message on patients vm to return my call so we know were to order labs

## 2012-10-31 NOTE — Telephone Encounter (Signed)
Message copied by Court Joy on Thu Oct 31, 2012  2:40 PM ------      Message from: Danise Edge A      Created: Wed Oct 30, 2012  4:14 PM       She sent a my chart message asking if she needs labs, yes she does she is supposed to call for a lab appt and if she does needs lipid, renal, cbc, tsh, hepatic.       THX ------

## 2012-11-05 LAB — HM PAP SMEAR: HM Pap smear: NORMAL

## 2012-11-05 LAB — HM MAMMOGRAPHY: HM Mammogram: NORMAL

## 2012-11-06 ENCOUNTER — Ambulatory Visit: Payer: BC Managed Care – PPO | Admitting: Family Medicine

## 2012-11-19 NOTE — Telephone Encounter (Signed)
Closing until pt returns call 

## 2012-12-02 ENCOUNTER — Ambulatory Visit: Payer: BC Managed Care – PPO | Admitting: Family Medicine

## 2012-12-02 ENCOUNTER — Other Ambulatory Visit (INDEPENDENT_AMBULATORY_CARE_PROVIDER_SITE_OTHER): Payer: BC Managed Care – PPO

## 2012-12-02 DIAGNOSIS — Z0000 Encounter for general adult medical examination without abnormal findings: Secondary | ICD-10-CM

## 2012-12-02 LAB — CBC
Hemoglobin: 13.8 g/dL (ref 12.0–15.0)
MCV: 90.1 fl (ref 78.0–100.0)
Platelets: 380 10*3/uL (ref 150.0–400.0)
RBC: 4.53 Mil/uL (ref 3.87–5.11)

## 2012-12-02 LAB — RENAL FUNCTION PANEL
CO2: 30 mEq/L (ref 19–32)
Chloride: 100 mEq/L (ref 96–112)
GFR: 67.34 mL/min (ref >60.00)
Phosphorus: 3.8 mg/dL (ref 2.3–4.6)

## 2012-12-02 LAB — LIPID PANEL
Cholesterol: 186 mg/dL (ref 0–200)
HDL: 49.8 mg/dL (ref >39.00)
VLDL: 33.8 mg/dL (ref 0.0–40.0)

## 2012-12-02 LAB — HEPATIC FUNCTION PANEL
ALT: 21 U/L (ref 0–35)
AST: 21 U/L (ref 0–37)
Bilirubin, Direct: 0.1 mg/dL (ref 0.0–0.3)
Total Bilirubin: 0.6 mg/dL (ref 0.3–1.2)

## 2012-12-02 NOTE — Progress Notes (Signed)
Labs only

## 2012-12-03 ENCOUNTER — Telehealth: Payer: Self-pay | Admitting: Family Medicine

## 2012-12-03 MED ORDER — PANTOPRAZOLE SODIUM 40 MG PO TBEC: 40.0000 mg | DELAYED_RELEASE_TABLET | Freq: Every day | ORAL | Status: DC

## 2012-12-03 NOTE — Telephone Encounter (Signed)
Refill-pantoprazole sod dr 40mg  tab. Take one tablet by mouth once daily. Qty 90 last fill 4.17.14

## 2012-12-03 NOTE — Telephone Encounter (Signed)
RX sent

## 2012-12-09 ENCOUNTER — Encounter: Payer: Self-pay | Admitting: Family Medicine

## 2012-12-09 ENCOUNTER — Ambulatory Visit (INDEPENDENT_AMBULATORY_CARE_PROVIDER_SITE_OTHER): Payer: BC Managed Care – PPO | Admitting: Family Medicine

## 2012-12-09 VITALS — BP 138/88 | HR 72 | Temp 98.1°F | Ht 63.0 in | Wt 317.1 lb

## 2012-12-09 DIAGNOSIS — J189 Pneumonia, unspecified organism: Secondary | ICD-10-CM

## 2012-12-09 DIAGNOSIS — M1990 Unspecified osteoarthritis, unspecified site: Secondary | ICD-10-CM

## 2012-12-09 DIAGNOSIS — M130 Polyarthritis, unspecified: Secondary | ICD-10-CM

## 2012-12-09 DIAGNOSIS — M129 Arthropathy, unspecified: Secondary | ICD-10-CM

## 2012-12-09 DIAGNOSIS — E785 Hyperlipidemia, unspecified: Secondary | ICD-10-CM

## 2012-12-09 DIAGNOSIS — E039 Hypothyroidism, unspecified: Secondary | ICD-10-CM

## 2012-12-09 DIAGNOSIS — D649 Anemia, unspecified: Secondary | ICD-10-CM

## 2012-12-09 DIAGNOSIS — R609 Edema, unspecified: Secondary | ICD-10-CM

## 2012-12-09 DIAGNOSIS — Z0000 Encounter for general adult medical examination without abnormal findings: Secondary | ICD-10-CM

## 2012-12-09 NOTE — Patient Instructions (Addendum)
Labs prior to visit, lipid, hgba1c, uric acid, hepatic, renal, cbc, tsh   Metabolic Syndrome, Adult Metabolic syndrome descibes a group of risk factors for heart disease and diabetes. This syndrome has other names including Insulin Resistance Syndrome. The more risk factors you have, the higher your risk of having a heart attack, stroke, or developing diabetes. These risk factors include:  High blood sugar.  High blood triglyceride (a fat found in the blood) level.  High blood pressure.  Abdominal obesity (your extra weight is around your waist instead of your hips).  Low levels of high-density lipoprotein, HDL (good blood cholesterol). If you have any three of these risk factors, you have metabolic syndrome. If you have even one of these factors, you should make lifestyle changes to improve your health in order to prevent serious health diseases.  In people with metabolic syndrome, the cells do not respond properly to insulin. This can lead to high levels of glucose in the blood, which can interfere with normal body processes. Eventually, this can cause high blood pressure and higher fat levels in the blood, and inflammation of your blood vessels. The result can be heart disease and stroke.  CAUSES   Eating a diet rich in calories and saturated fat.  Too little physical activity.  Being overweight. Other underlying causes are:  Family history (genetics).  Ethnicity (South Asians are at a higher risk).  Older age (your chances of developing metabolic syndrome are higher as you grow older).  Insulin resistance. SYMPTOMS  By itself, metabolic syndrome has no symptoms. However, you might have symptoms of diabetes (high blood sugar) or high blood pressure, such as:  Increased thirst, urination, and tiredness.  Dizzy spells.  Dull headaches that are unusual for you.  Blurred vision.  Nosebleeds. DIAGNOSIS  Your caregiver may make a diagnosis of metabolic syndrome if you have  at least three of these factors:  If you are overweight mostly around the waist. This means a waistline greater than 40" in men and more than 35" in women. The waistline limits are 31 to 35 inches for women and 37 to 39 inches for men. In those who have certain genetic risk factors, such as having a family history of diabetes or being of Asian descent.  If you have a blood pressure of 130/85 mm Hg or more, or if you are being treated for high blood pressure.  If your blood triglyceride level is 150 mg/dL or more, or you are being treated for high levels of triglyceride.  If the level of HDL in your blood is below 40 mg/dL in men, less than 50 mg/dL in women, or you are receiving treatment for low levels of HDL.  If the level of sugar in your blood is high with fasting blood sugar level of 110 mg/dL or more, or you are under treatment for diabetes. TREATMENT  Your caregiver may have you make lifestyle changes, which may include:  Exercise.  Losing weight.  Maintaining a healthy diet.  Quitting smoking. The lifestyle changes listed above are key in reducing your risk for heart disease and stroke. Medicines may also be prescribed to help your body respond to insulin better and to reduce your blood pressure and blood fat levels. Aspirin may be recommended to reduce risks of heart disease or stroke.  HOME CARE INSTRUCTIONS   Exercise.  Measure your waist at regular intervals just above the hipbones after you have breathed out.  Maintain a healthy diet.  Eat fruits, such  as apples, oranges, and pears.  Eat vegetables.  Eat legumes, such as kidney beans, peas, and lentils.  Eat food rich in soluble fiber, such as whole grain cereal, oatmeal, and oat bran.  Use olive or safflower oils and avoid saturated fats.  Eat nuts.  Limit the amount of salt you eat or add to food.  Limit the amount of alcohol you drink.  Include fish in your diet, if possible.  Stop smoking if you are a  smoker.  Maintain regular follow-up appointments.  Follow your caregiver's advice. SEEK MEDICAL CARE IF:   You feel very tired or fatigued.  You develop excessive thirst.  You pass large quantities of urine.  You are putting on weight around your waist rather than losing weight.  You develop headaches over and over again.  You have off-and-on dizzy spells. SEEK IMMEDIATE MEDICAL CARE IF:   You develop nosebleeds.  You develop sudden blurred vision.  You develop sudden dizzy spells.  You develop chest pains, trouble breathing, or feel an abnormal or irregular heart beat.  You have a fainting episode.  You develop any sudden trouble speaking and/or swallowing.  You develop sudden weakness in one arm and/or one leg. MAKE SURE YOU:   Understand these instructions.  Will watch your condition.  Will get help right away if you are not doing well or get worse. Document Released: 10/17/2007 Document Revised: 10/02/2011 Document Reviewed: 10/17/2007 [facility] Patient Information 2013 [place], [place].

## 2012-12-09 NOTE — Assessment & Plan Note (Signed)
Left foot swelling better since having a steroid injection in her left knee last week. Encouraged compression hose, avoid sodium elevate feet

## 2012-12-09 NOTE — Progress Notes (Signed)
[name], female   DOB: [birth year], 62 y.o.   MRN: [id number] Rebecca Brooks [id number] January 02, 1951 12/09/2012      Progress Note-Follow Up  Subjective  Chief Complaint  Chief Complaint  Patient presents with  . Annual Exam    physical    HPI  Patient is a 62 year old Caucasian female who is in today for followup. She is generally feeling well. Her major complaints continue to be musculoskeletal. She's had a lot of trouble with shoulder and knee pain. His following with rheumatology. Recently had a steroid shot in her left knee which she didn't find helpful. Continues to struggle mostly with left shoulder pain. Reports her recent hemoglobin A1c was 5.8. Denies polyuria or polydipsia. No chest pain, palpitations, shortness of breath, GI or GU concerns noted.  Past Medical History  Diagnosis Date  . Pneumonia     this October from which she has said inhailer  . Obesity   . Chicken pox as a child  . Measles as a child  . Mumps as a child  . Arthritis   . GERD (gastroesophageal reflux disease)   . Hyperlipidemia   . Hypertension   . Hypothyroidism   . Palpitations 08/01/2011  . Anemia 08/01/2011  . Hypokalemia 08/01/2011  . Leukocytosis 08/01/2011  . Low back pain radiating to right leg     intermittent and occasional numbness of skin over right hip in certain positions  . Candidal skin infection 08/11/2011  . UTI (lower urinary tract infection) 09/17/2011  . Knee pain, right 10/18/2011  . Shoulder pain, bilateral 11/01/2011  . Recurrent epistaxis 02/20/2012    Past Surgical History  Procedure Laterality Date  . Thyroidectomy      total for benign tumor, Parathyroid spared  . Tonsillectomy    . Cesarean section      X 3  . Joint replacement  2006    right hip replacement, secondary to congenital  hip defect  . Hernia repair  62 yrs old    right inguinal    Family History  Problem Relation Age of Onset  . Cancer Mother 77    lung/ smoker  . Heart disease  Mother   . Hearing loss Mother     tachycardia  . Mental illness Mother     anxiety, claustrophobia  . Osteoarthritis Father   . Hyperlipidemia Father   . Hyperlipidemia Sister   . Migraines Sister   . Hypertension Sister   . Cancer Maternal Grandmother     colon  . Heart attack Maternal Grandfather   . Aneurysm Paternal Grandfather     abdominal  . Heart disease Paternal Grandfather     AAA rupture, smoker    History   Social History  . Marital Status: Married    Spouse Name: N/A    Number of Children: N/A  . Years of Education: N/A   Occupational History  . Not on file.   Social History Main Topics  . Smoking status: Former Smoker -- 0.50 packs/day for 5 years    Types: Cigarettes    Quit date: 07/25/1971  . Smokeless tobacco: Never Used  . Alcohol Use: 8.4 oz/week    14 Glasses of wine per week  . Drug Use: No  . Sexually Active: No   Other Topics Concern  . Not on file   Social History Narrative  . No narrative on file    Current Outpatient Prescriptions on File Prior to Visit  Medication Sig  Dispense Refill  . allopurinol (ZYLOPRIM) 100 MG tablet TAKE 1 TABLET BY MOUTH ONCE DAILY  90 tablet  1  . [facility] VITAMIN D3 1000 UNITS capsule TAKE 1 CAPSULE BY MOUTH ONCE DAILY  90 capsule  1  . famotidine (PEPCID) 20 MG tablet Take 1 tablet (20 mg total) by mouth at bedtime.  90 tablet  1  . furosemide (LASIX) 20 MG tablet Take 1 tablet (20 mg total) by mouth daily as needed. 1 tablet daily as needed  30 tablet  3  . hydrochlorothiazide (HYDRODIURIL) 25 MG tablet TAKE 1 TABLET BY MOUTH EVERY DAY  90 tablet  1  . loratadine (CLARITIN) 10 MG tablet Take 1 tablet (10 mg total) by mouth daily.  90 tablet  1  . metoprolol tartrate (LOPRESSOR) 25 MG tablet Take 1 tablet (25 mg total) by mouth 2 (two) times daily.  180 tablet  3  . pantoprazole (PROTONIX) 40 MG tablet Take 1 tablet (40 mg total) by mouth daily.  90 tablet  1  . potassium chloride SA (K-DUR,KLOR-CON) 20 MEQ  tablet Take 1 tablet (20 mEq total) by mouth daily as needed (lasix use).  30 tablet  3  . simvastatin (ZOCOR) 40 MG tablet Take 1 tablet (40 mg total) by mouth every evening.  90 tablet  1  . SYNTHROID 100 MCG tablet TAKE AS DIRECTED  90 tablet  0  . traMADol (ULTRAM) 50 MG tablet Take 50 mg by mouth 2 (two) times daily.      . [DISCONTINUED] budesonide-formoterol (SYMBICORT) 160-4.5 MCG/ACT inhaler Inhale 2 puffs into the lungs 2 (two) times daily as needed. Patient has sample, is not using  1 Inhaler  0   No current facility-administered medications on file prior to visit.    Allergies  Allergen Reactions  . Accupril (Quinapril Hcl) Cough    Review of Systems  Review of Systems  Constitutional: Negative for fever, chills and malaise/fatigue.  HENT: Negative for hearing loss, nosebleeds and congestion.   Eyes: Negative for discharge.  Respiratory: Negative for cough, sputum production, shortness of breath and wheezing.   Cardiovascular: Negative for chest pain, palpitations and leg swelling.  Gastrointestinal: Negative for heartburn, nausea, vomiting, abdominal pain, diarrhea, constipation and blood in stool.  Genitourinary: Negative for dysuria, urgency, frequency and hematuria.  Musculoskeletal: Negative for myalgias, back pain and falls.  Skin: Negative for rash.  Neurological: Negative for dizziness, tremors, sensory change, focal weakness, loss of consciousness, weakness and headaches.  Endo/Heme/Allergies: Negative for polydipsia. Does not bruise/bleed easily.  Psychiatric/Behavioral: Negative for depression and suicidal ideas. The patient is not nervous/anxious and does not have insomnia.     Objective  BP 138/88  Pulse 72  Temp(Src) 98.1 F (36.7 C) (Oral)  Ht 5\' 3"  (1.6 m)  Wt 317 lb 1.9 oz (143.845 kg)  BMI 56.19 kg/m2  SpO2 98%  Physical Exam  Physical Exam  Constitutional: She is oriented to person, place, and time and well-developed, well-nourished, and in  no distress. No distress.  HENT:  Head: Normocephalic and atraumatic.  Right Ear: External ear normal.  Left Ear: External ear normal.  Nose: Nose normal.  Mouth/Throat: Oropharynx is clear and moist. No oropharyngeal exudate.  Eyes: Conjunctivae are normal. Pupils are equal, round, and reactive to light. Right eye exhibits no discharge. Left eye exhibits no discharge. No scleral icterus.  Neck: Normal range of motion. Neck supple. No thyromegaly present.  Cardiovascular: Normal rate, regular rhythm, normal heart sounds and intact distal  pulses.   No murmur heard. Pulmonary/Chest: Effort normal and breath sounds normal. No respiratory distress. She has no wheezes. She has no rales.  Abdominal: Soft. Bowel sounds are normal. She exhibits no distension and no mass. There is no tenderness.  Musculoskeletal: Normal range of motion. She exhibits no edema and no tenderness.  Lymphadenopathy:    She has no cervical adenopathy.  Neurological: She is alert and oriented to person, place, and time. She has normal reflexes. No cranial nerve deficit. Coordination normal.  Skin: Skin is warm and dry. No rash noted. She is not diaphoretic.  Psychiatric: Mood, memory and affect normal.    Lab Results  Component Value Date   TSH 3.09 12/02/2012   Lab Results  Component Value Date   WBC 12.8* 12/02/2012   HGB 13.8 12/02/2012   HCT 40.8 12/02/2012   MCV 90.1 12/02/2012   PLT 380.0 12/02/2012   Lab Results  Component Value Date   CREATININE 0.9 12/02/2012   BUN 16 12/02/2012   NA 138 12/02/2012   K 4.6 12/02/2012   CL 100 12/02/2012   CO2 30 12/02/2012   Lab Results  Component Value Date   ALT 21 12/02/2012   AST 21 12/02/2012   ALKPHOS 93 12/02/2012   BILITOT 0.6 12/02/2012   Lab Results  Component Value Date   CHOL 186 12/02/2012   Lab Results  Component Value Date   HDL 49.80 12/02/2012   Lab Results  Component Value Date   LDLCALC 102* 12/02/2012   Lab Results  Component Value Date   TRIG  169.0* 12/02/2012   Lab Results  Component Value Date   CHOLHDL 4 12/02/2012     Assessment & Plan  Peripheral edema Left foot swelling better since having a steroid injection in her left knee last week. Encouraged compression hose, avoid sodium elevate feet  Arthritis Left shoulder bothering her at present. Encouraged maintain activity as tolerated, will follow up with ortho as needed.  Arthritis of multiple sites Just had a steroid injection in her left knee from rheumatology it was helpful  Anemia resolved  Hyperlipidemia Avoid simple carbs and trans fats. Continue Simvastatin, add krill oil caps  Hypothyroidism Well treated. Thyroid studies wnl   Preventative health care Encouraged DASH diet, adequate sleep, increase exercise. Minimize simple carbs and saturated fats.

## 2012-12-13 ENCOUNTER — Encounter: Payer: Self-pay | Admitting: Family Medicine

## 2012-12-13 DIAGNOSIS — Z0000 Encounter for general adult medical examination without abnormal findings: Secondary | ICD-10-CM | POA: Insufficient documentation

## 2012-12-13 NOTE — Assessment & Plan Note (Signed)
Well treated. Thyroid studies wnl

## 2012-12-13 NOTE — Assessment & Plan Note (Signed)
Just had a steroid injection in her left knee from rheumatology it was helpful

## 2012-12-13 NOTE — Assessment & Plan Note (Signed)
resolved 

## 2012-12-13 NOTE — Assessment & Plan Note (Signed)
Encouraged DASH diet, adequate sleep, increase exercise. Minimize simple carbs and saturated fats.

## 2012-12-13 NOTE — Assessment & Plan Note (Signed)
Avoid simple carbs and trans fats. Continue Simvastatin, add krill oil caps

## 2012-12-13 NOTE — Assessment & Plan Note (Signed)
Left shoulder bothering her at present. Encouraged maintain activity as tolerated, will follow up with ortho as needed.

## 2013-01-20 ENCOUNTER — Telehealth: Payer: Self-pay | Admitting: Family Medicine

## 2013-01-20 MED ORDER — SYNTHROID 100 MCG PO TABS: 100.0000 ug | ORAL_TABLET | Freq: Every day | ORAL | Status: DC

## 2013-01-20 NOTE — Telephone Encounter (Signed)
Refill- synthroid tablet. Take one tablet every day as directed. Qty 90 last fill 3.21.14

## 2013-02-03 ENCOUNTER — Other Ambulatory Visit: Payer: Self-pay | Admitting: *Deleted

## 2013-02-03 MED ORDER — CHOLECALCIFEROL 25 MCG (1000 UT) PO CAPS: ORAL_CAPSULE | ORAL | Status: DC

## 2013-02-03 MED ORDER — ALLOPURINOL 100 MG PO TABS: ORAL_TABLET | ORAL | Status: DC

## 2013-02-03 NOTE — Telephone Encounter (Signed)
Rx request to pharmacy/[facility]  

## 2013-02-12 ENCOUNTER — Other Ambulatory Visit: Payer: Self-pay | Admitting: Family Medicine

## 2013-02-27 ENCOUNTER — Ambulatory Visit (INDEPENDENT_AMBULATORY_CARE_PROVIDER_SITE_OTHER): Payer: BC Managed Care – PPO | Admitting: Physician Assistant

## 2013-02-27 ENCOUNTER — Encounter: Payer: Self-pay | Admitting: Physician Assistant

## 2013-02-27 VITALS — BP 142/84 | HR 93 | Temp 98.0°F | Resp 18 | Wt 321.2 lb

## 2013-02-27 DIAGNOSIS — J019 Acute sinusitis, unspecified: Secondary | ICD-10-CM | POA: Insufficient documentation

## 2013-02-27 MED ORDER — AMOXICILLIN-POT CLAVULANATE 875-125 MG PO TABS: 1.0000 | ORAL_TABLET | Freq: Two times a day (BID) | ORAL | Status: DC

## 2013-02-27 NOTE — Assessment & Plan Note (Signed)
Rx Augmentin.  Take with food. Fluids and rest. Continue daily claritin and mucinex.  Use saline nasal spray.  Please call or return to clinic if symptoms persist past antibiotic therapy

## 2013-02-27 NOTE — Patient Instructions (Addendum)
Drink plenty of fluids and get plenty of rest.  Please take antibiotic as prescribed, with food or milk to help reduce chance of upset stomach.  Continue daily claritin and mucinex.  Try saline nasal spray.  Please call or return to clinic if symptoms persist past antibiotic therapy  Sinusitis Sinusitis is redness, soreness, and swelling (inflammation) of the paranasal sinuses. Paranasal sinuses are air pockets within the bones of your face (beneath the eyes, the middle of the forehead, or above the eyes). In healthy paranasal sinuses, mucus is able to drain out, and air is able to circulate through them by way of your nose. However, when your paranasal sinuses are inflamed, mucus and air can become trapped. This can allow bacteria and other germs to grow and cause infection. Sinusitis can develop quickly and last only a short time (acute) or continue over a long period (chronic). Sinusitis that lasts for more than 12 weeks is considered chronic.  CAUSES  Causes of sinusitis include:  Allergies.  Structural abnormalities, such as displacement of the cartilage that separates your nostrils (deviated septum), which can decrease the air flow through your nose and sinuses and affect sinus drainage.  Functional abnormalities, such as when the small hairs (cilia) that line your sinuses and help remove mucus do not work properly or are not present. SYMPTOMS  Symptoms of acute and chronic sinusitis are the same. The primary symptoms are pain and pressure around the affected sinuses. Other symptoms include:  Upper toothache.  Earache.  Headache.  Bad breath.  Decreased sense of smell and taste.  A cough, which worsens when you are lying flat.  Fatigue.  Fever.  Thick drainage from your nose, which often is green and may contain pus (purulent).  Swelling and warmth over the affected sinuses. DIAGNOSIS  Your caregiver will perform a physical exam. During the exam, your caregiver may:  Look  in your nose for signs of abnormal growths in your nostrils (nasal polyps).  Tap over the affected sinus to check for signs of infection.  View the inside of your sinuses (endoscopy) with a special imaging device with a light attached (endoscope), which is inserted into your sinuses. If your caregiver suspects that you have chronic sinusitis, one or more of the following tests may be recommended:  Allergy tests.  Nasal culture A sample of mucus is taken from your nose and sent to a lab and screened for bacteria.  Nasal cytology A sample of mucus is taken from your nose and examined by your caregiver to determine if your sinusitis is related to an allergy. TREATMENT  Most cases of acute sinusitis are related to a viral infection and will resolve on their own within 10 days. Sometimes medicines are prescribed to help relieve symptoms (pain medicine, decongestants, nasal steroid sprays, or saline sprays).  However, for sinusitis related to a bacterial infection, your caregiver will prescribe antibiotic medicines. These are medicines that will help kill the bacteria causing the infection.  Rarely, sinusitis is caused by a fungal infection. In theses cases, your caregiver will prescribe antifungal medicine. For some cases of chronic sinusitis, surgery is needed. Generally, these are cases in which sinusitis recurs more than 3 times per year, despite other treatments. HOME CARE INSTRUCTIONS   Drink plenty of water. Water helps thin the mucus so your sinuses can drain more easily.  Use a humidifier.  Inhale steam 3 to 4 times a day (for example, sit in the bathroom with the shower running).  Apply  a warm, moist washcloth to your face 3 to 4 times a day, or as directed by your caregiver.  Use saline nasal sprays to help moisten and clean your sinuses.  Take over-the-counter or prescription medicines for pain, discomfort, or fever only as directed by your caregiver. SEEK IMMEDIATE MEDICAL CARE  IF:  You have increasing pain or severe headaches.  You have nausea, vomiting, or drowsiness.  You have swelling around your face.  You have vision problems.  You have a stiff neck.  You have difficulty breathing. MAKE SURE YOU:   Understand these instructions.  Will watch your condition.  Will get help right away if you are not doing well or get worse. Document Released: 07/10/2005 Document Revised: 10/02/2011 Document Reviewed: 07/25/2011 [facility] Patient Information 2014 [place], [place].

## 2013-02-27 NOTE — Progress Notes (Signed)
[name], female   DOB: [birth year], 62 y.o.   MRN: [id number]   Patient is a 62 year-old caucasian female who presents to clinic c/o sinus pressure, nasal congestion, headache and sore throat x 1.5 weeks.  Patient endorses cough productive of yellow sputum.  Patient endorses yellow-green nasal discharge.  Patient denies wheezing or shortness of breath.  Denies fever.  Has noticed some ear stuffiness over the past few days.  Has tried Mucinex with some relief of symptoms.  Has history of seasonal allergies but denies history of asthma.  Denies smoking.  Denies sick contact.   Past Medical History  Diagnosis Date  . Pneumonia     this October from which she has said inhailer  . Obesity   . Chicken pox as a child  . Measles as a child  . Mumps as a child  . Arthritis   . GERD (gastroesophageal reflux disease)   . Hyperlipidemia   . Hypertension   . Hypothyroidism   . Palpitations 08/01/2011  . Anemia 08/01/2011  . Hypokalemia 08/01/2011  . Leukocytosis 08/01/2011  . Low back pain radiating to right leg     intermittent and occasional numbness of skin over right hip in certain positions  . Candidal skin infection 08/11/2011  . UTI (lower urinary tract infection) 09/17/2011  . Knee pain, right 10/18/2011  . Shoulder pain, bilateral 11/01/2011  . Recurrent epistaxis 02/20/2012  . Preventative health care 12/13/2012   Current Outpatient Prescriptions on File Prior to Visit  Medication Sig Dispense Refill  . allopurinol (ZYLOPRIM) 100 MG tablet TAKE 1 TABLET BY MOUTH ONCE DAILY  90 tablet  1  . Cholecalciferol ([facility] VITAMIN D3) 1000 UNITS capsule TAKE 1 CAPSULE BY MOUTH ONCE DAILY  90 capsule  1  . famotidine (PEPCID) 20 MG tablet TAKE 1 TABLET BY MOUTH AT BEDTIME  90 tablet  1  . furosemide (LASIX) 20 MG tablet Take 1 tablet (20 mg total) by mouth daily as needed. 1 tablet daily as needed  30 tablet  3  . hydrochlorothiazide (HYDRODIURIL) 25 MG tablet TAKE 1 TABLET BY MOUTH EVERY DAY   90 tablet  1  . loratadine (CLARITIN) 10 MG tablet Take 1 tablet (10 mg total) by mouth daily.  90 tablet  1  . metoprolol tartrate (LOPRESSOR) 25 MG tablet Take 1 tablet (25 mg total) by mouth 2 (two) times daily.  180 tablet  3  . pantoprazole (PROTONIX) 40 MG tablet Take 1 tablet (40 mg total) by mouth daily.  90 tablet  1  . potassium chloride SA (K-DUR,KLOR-CON) 20 MEQ tablet Take 1 tablet (20 mEq total) by mouth daily as needed (lasix use).  30 tablet  3  . simvastatin (ZOCOR) 40 MG tablet Take 1 tablet (40 mg total) by mouth every evening.  90 tablet  1  . SYNTHROID 100 MCG tablet Take 1 tablet (100 mcg total) by mouth daily before breakfast.  90 tablet  1  . traMADol (ULTRAM) 50 MG tablet Take 50 mg by mouth 2 (two) times daily.      . [DISCONTINUED] budesonide-formoterol (SYMBICORT) 160-4.5 MCG/ACT inhaler Inhale 2 puffs into the lungs 2 (two) times daily as needed. Patient has sample, is not using  1 Inhaler  0   No current facility-administered medications on file prior to visit.   Allergies  Allergen Reactions  . Accupril (Quinapril Hcl) Cough   Family History  Problem Relation Age of Onset  . Cancer Mother 25  lung/ smoker  . Heart disease Mother   . Hearing loss Mother     tachycardia  . Mental illness Mother     anxiety, claustrophobia  . Osteoarthritis Father   . Hyperlipidemia Father   . Hyperlipidemia Sister   . Migraines Sister   . Hypertension Sister   . Cancer Maternal Grandmother     colon  . Heart attack Maternal Grandfather   . Aneurysm Paternal Grandfather     abdominal  . Heart disease Paternal Grandfather     AAA rupture, smoker   History   Social History  . Marital Status: Married    Spouse Name: N/A    Number of Children: N/A  . Years of Education: N/A   Social History Main Topics  . Smoking status: Former Smoker -- 0.50 packs/day for 5 years    Types: Cigarettes    Quit date: 07/25/1971  . Smokeless tobacco: Never Used  . Alcohol Use:  8.4 oz/week    14 Glasses of wine per week  . Drug Use: No  . Sexually Active: No   Other Topics Concern  . None   Social History Narrative  . None   Review of Systems  Constitutional: Positive for malaise/fatigue. Negative for fever, chills and weight loss.  HENT: Positive for congestion and sore throat. Negative for ear pain and ear discharge.   Respiratory: Positive for cough and sputum production. Negative for hemoptysis, shortness of breath and wheezing.   Cardiovascular: Negative for chest pain and palpitations.  Gastrointestinal: Negative for nausea, vomiting, abdominal pain, diarrhea and constipation.  Musculoskeletal: Negative for myalgias.  Neurological: Positive for headaches.    Filed Vitals:   02/27/13 1425  BP: 142/84  Pulse: 93  Temp: 98 F (36.7 C)  Resp: 18   Physical Exam  Vitals reviewed. Constitutional: She is oriented to person, place, and time and well-developed, well-nourished, and in no distress.  HENT:  Head: Normocephalic and atraumatic.  Nose: Nose normal.  Mouth/Throat: Oropharynx is clear and moist. No oropharyngeal exudate.  Fluid noted behind R TM.  TM without erythema, retraction or bulging.  Left TM WNL. + Frontal and Maxillary sinus tenderness with percussion.  Eyes: Conjunctivae are normal. Pupils are equal, round, and reactive to light.  Neck: Normal range of motion. Neck supple.  Cardiovascular: Normal rate, regular rhythm and normal heart sounds.   Pulmonary/Chest: Effort normal and breath sounds normal. No respiratory distress. She has no wheezes. She has no rales. She exhibits no tenderness.  Lymphadenopathy:    She has no cervical adenopathy.  Neurological: She is alert and oriented to person, place, and time.  Skin: Skin is warm and dry. No rash noted.    Assessment/Plan: No problem-specific assessment & plan notes found for this encounter.

## 2013-03-17 ENCOUNTER — Other Ambulatory Visit: Payer: Self-pay | Admitting: Family Medicine

## 2013-03-18 ENCOUNTER — Other Ambulatory Visit: Payer: Self-pay | Admitting: Family Medicine

## 2013-04-15 ENCOUNTER — Other Ambulatory Visit: Payer: Self-pay | Admitting: *Deleted

## 2013-04-15 DIAGNOSIS — E785 Hyperlipidemia, unspecified: Secondary | ICD-10-CM

## 2013-04-15 MED ORDER — SIMVASTATIN 40 MG PO TABS: 40.0000 mg | ORAL_TABLET | Freq: Every evening | ORAL | Status: DC

## 2013-04-15 NOTE — Telephone Encounter (Signed)
Rx request to pharmacy/[facility]  

## 2013-05-13 ENCOUNTER — Other Ambulatory Visit: Payer: Self-pay | Admitting: Family Medicine

## 2013-05-29 ENCOUNTER — Other Ambulatory Visit: Payer: Self-pay

## 2013-06-02 ENCOUNTER — Other Ambulatory Visit: Payer: Self-pay | Admitting: Family Medicine

## 2013-06-12 ENCOUNTER — Ambulatory Visit (INDEPENDENT_AMBULATORY_CARE_PROVIDER_SITE_OTHER): Payer: BC Managed Care – PPO | Admitting: Family Medicine

## 2013-06-12 ENCOUNTER — Encounter: Payer: Self-pay | Admitting: Family Medicine

## 2013-06-12 VITALS — BP 118/72 | HR 90 | Temp 98.2°F | Ht 63.0 in | Wt 303.0 lb

## 2013-06-12 DIAGNOSIS — E669 Obesity, unspecified: Secondary | ICD-10-CM

## 2013-06-12 DIAGNOSIS — I10 Essential (primary) hypertension: Secondary | ICD-10-CM

## 2013-06-12 DIAGNOSIS — K219 Gastro-esophageal reflux disease without esophagitis: Secondary | ICD-10-CM

## 2013-06-12 DIAGNOSIS — E039 Hypothyroidism, unspecified: Secondary | ICD-10-CM

## 2013-06-12 NOTE — Progress Notes (Signed)
Pre visit review using our clinic review tool, if applicable. No additional management support is needed unless otherwise documented below in the visit note. 

## 2013-06-12 NOTE — Progress Notes (Signed)
[name], female   DOB: [birth year], 62 y.o.   MRN: [id number] RHYLI Brooks [id number] 1951-02-10 06/12/2013      Progress Note-Follow Up  Subjective  Chief Complaint  Chief Complaint  Patient presents with  . Follow-up    6 month    HPI  Patient is a 62 year old Caucasian female who is in today for followup. She's actually doing very well. She has started following at [facility] with the Optifast program and has lost a good amount of weight. Has had to struggle with her husbands recent cardiac disease and coronary artery bypass graft but he is doing well now. Continues to follow with rheumatology for pain in her knees but does note it is improving with weight loss. Denies any other recent illness. No chest pain, palpitations, shortness of breath, GI or GU concerns noted at this time.  Past Medical History  Diagnosis Date  . Pneumonia     this October from which she has said inhailer  . Obesity   . Chicken pox as a child  . Measles as a child  . Mumps as a child  . Arthritis   . GERD (gastroesophageal reflux disease)   . Hyperlipidemia   . Hypertension   . Hypothyroidism   . Palpitations 08/01/2011  . Anemia 08/01/2011  . Hypokalemia 08/01/2011  . Leukocytosis 08/01/2011  . Low back pain radiating to right leg     intermittent and occasional numbness of skin over right hip in certain positions  . Candidal skin infection 08/11/2011  . UTI (lower urinary tract infection) 09/17/2011  . Knee pain, right 10/18/2011  . Shoulder pain, bilateral 11/01/2011  . Recurrent epistaxis 02/20/2012  . Preventative health care 12/13/2012    Past Surgical History  Procedure Laterality Date  . Thyroidectomy      total for benign tumor, Parathyroid spared  . Tonsillectomy    . Cesarean section      X 3  . Joint replacement  2006    right hip replacement, secondary to congenital  hip defect  . Hernia repair  62 yrs old    right inguinal    Family History  Problem Relation Age  of Onset  . Cancer Mother 2    lung/ smoker  . Heart disease Mother   . Hearing loss Mother     tachycardia  . Mental illness Mother     anxiety, claustrophobia  . Osteoarthritis Father   . Hyperlipidemia Father   . Hyperlipidemia Sister   . Migraines Sister   . Hypertension Sister   . Cancer Maternal Grandmother     colon  . Heart attack Maternal Grandfather   . Aneurysm Paternal Grandfather     abdominal  . Heart disease Paternal Grandfather     AAA rupture, smoker    History   Social History  . Marital Status: Married    Spouse Name: N/A    Number of Children: N/A  . Years of Education: N/A   Occupational History  . Not on file.   Social History Main Topics  . Smoking status: Former Smoker -- 0.50 packs/day for 5 years    Types: Cigarettes    Quit date: 07/25/1971  . Smokeless tobacco: Never Used  . Alcohol Use: 8.4 oz/week    14 Glasses of wine per week  . Drug Use: No  . Sexual Activity: No   Other Topics Concern  . Not on file   Social History Narrative  .  No narrative on file    Current Outpatient Prescriptions on File Prior to Visit  Medication Sig Dispense Refill  . allopurinol (ZYLOPRIM) 100 MG tablet TAKE 1 TABLET BY MOUTH ONCE DAILY  90 tablet  1  . Cholecalciferol ([facility] VITAMIN D3) 1000 UNITS capsule TAKE 1 CAPSULE BY MOUTH ONCE DAILY  90 capsule  1  . famotidine (PEPCID) 20 MG tablet TAKE 1 TABLET BY MOUTH AT BEDTIME  90 tablet  1  . furosemide (LASIX) 20 MG tablet Take 1 tablet (20 mg total) by mouth daily as needed. 1 tablet daily as needed  30 tablet  3  . hydrochlorothiazide (HYDRODIURIL) 25 MG tablet TAKE 1 TABLET BY MOUTH EVERY DAY  90 tablet  1  . loratadine (CLARITIN) 10 MG tablet TAKE 1 TABLET BY MOUTH ONCE DAILY  30 tablet  3  . metoprolol tartrate (LOPRESSOR) 25 MG tablet Take 1 tablet (25 mg total) by mouth 2 (two) times daily.  180 tablet  3  . pantoprazole (PROTONIX) 40 MG tablet TAKE 1 TABLET (40 MG TOTAL) BY MOUTH DAILY.  90  tablet  1  . potassium chloride SA (K-DUR,KLOR-CON) 20 MEQ tablet Take 1 tablet (20 mEq total) by mouth daily as needed (lasix use).  30 tablet  3  . simvastatin (ZOCOR) 40 MG tablet Take 1 tablet (40 mg total) by mouth every evening.  90 tablet  0  . SYNTHROID 100 MCG tablet Take 1 tablet (100 mcg total) by mouth daily before breakfast.  90 tablet  1  . traMADol (ULTRAM) 50 MG tablet Take 50 mg by mouth 2 (two) times daily.      . [DISCONTINUED] budesonide-formoterol (SYMBICORT) 160-4.5 MCG/ACT inhaler Inhale 2 puffs into the lungs 2 (two) times daily as needed. Patient has sample, is not using  1 Inhaler  0   No current facility-administered medications on file prior to visit.    Allergies  Allergen Reactions  . Accupril [Quinapril Hcl] Cough    Review of Systems  Review of Systems  Constitutional: Positive for weight loss. Negative for fever and malaise/fatigue.       Opti fast at [facility] has lost 24 pounds this past month  HENT: Negative for congestion.   Eyes: Negative for discharge.  Respiratory: Negative for shortness of breath.   Cardiovascular: Negative for chest pain, palpitations and leg swelling.  Gastrointestinal: Negative for nausea, abdominal pain and diarrhea.  Genitourinary: Negative for dysuria.  Musculoskeletal: Negative for falls.  Skin: Negative for rash.  Neurological: Negative for loss of consciousness and headaches.  Endo/Heme/Allergies: Negative for polydipsia.  Psychiatric/Behavioral: Negative for depression and suicidal ideas. The patient is not nervous/anxious and does not have insomnia.     Objective  BP 118/72  Pulse 90  Temp(Src) 98.2 F (36.8 C) (Oral)  Ht 5\' 3"  (1.6 m)  Wt 303 lb (137.44 kg)  BMI 53.69 kg/m2  SpO2 96%  Physical Exam  Physical Exam  Constitutional: She is oriented to person, place, and time and well-developed, well-nourished, and in no distress. No distress.  HENT:  Head: Normocephalic and atraumatic.  Eyes:  Conjunctivae are normal.  Neck: Neck supple. No thyromegaly present.  Cardiovascular: Normal rate, regular rhythm and normal heart sounds.   No murmur heard. Pulmonary/Chest: Effort normal and breath sounds normal. She has no wheezes.  Abdominal: She exhibits no distension and no mass.  Musculoskeletal: She exhibits no edema.  Lymphadenopathy:    She has no cervical adenopathy.  Neurological: She is alert  and oriented to person, place, and time.  Skin: Skin is warm and dry. No rash noted. She is not diaphoretic.  Psychiatric: Memory, affect and judgment normal.    Lab Results  Component Value Date   TSH 3.09 12/02/2012   Lab Results  Component Value Date   WBC 12.8* 12/02/2012   HGB 13.8 12/02/2012   HCT 40.8 12/02/2012   MCV 90.1 12/02/2012   PLT 380.0 12/02/2012   Lab Results  Component Value Date   CREATININE 0.9 12/02/2012   BUN 16 12/02/2012   NA 138 12/02/2012   K 4.6 12/02/2012   CL 100 12/02/2012   CO2 30 12/02/2012   Lab Results  Component Value Date   ALT 21 12/02/2012   AST 21 12/02/2012   ALKPHOS 93 12/02/2012   BILITOT 0.6 12/02/2012   Lab Results  Component Value Date   CHOL 186 12/02/2012   Lab Results  Component Value Date   HDL 49.80 12/02/2012   Lab Results  Component Value Date   LDLCALC 102* 12/02/2012   Lab Results  Component Value Date   TRIG 169.0* 12/02/2012   Lab Results  Component Value Date   CHOLHDL 4 12/02/2012     Assessment & Plan  Hypertension Well controlled,no change in meds  Hypothyroidism Stable on current dose of Synthroid  GERD (gastroesophageal reflux disease) Improving with dietary changes and current meds  Obesity (BMI 30-39.9) Is using Optifast at [facility] and has good weight loss, will continue same  Asthma No recent exacerbations

## 2013-06-12 NOTE — Patient Instructions (Signed)
benefiber twice a day, powder Probiotics daily as such as Digestive Advantage, Align, or a generic  Constipation, Adult Constipation is when a person has fewer than 3 bowel movements a week; has difficulty having a bowel movement; or has stools that are dry, hard, or larger than normal. As people grow older, constipation is more common. If you try to fix constipation with medicines that make you have a bowel movement (laxatives), the problem may get worse. Long-term laxative use may cause the muscles of the colon to become weak. A low-fiber diet, not taking in enough fluids, and taking certain medicines may make constipation worse. CAUSES   Certain medicines, such as antidepressants, pain medicine, iron supplements, antacids, and water pills.   Certain diseases, such as diabetes, irritable bowel syndrome (IBS), thyroid disease, or depression.   Not drinking enough water.   Not eating enough fiber-rich foods.   Stress or travel.  Lack of physical activity or exercise.  Not going to the restroom when there is the urge to have a bowel movement.  Ignoring the urge to have a bowel movement.  Using laxatives too much. SYMPTOMS   Having fewer than 3 bowel movements a week.   Straining to have a bowel movement.   Having hard, dry, or larger than normal stools.   Feeling full or bloated.   Pain in the lower abdomen.  Not feeling relief after having a bowel movement. DIAGNOSIS  Your caregiver will take a medical history and perform a physical exam. Further testing may be done for severe constipation. Some tests may include:   A barium enema X-ray to examine your rectum, colon, and sometimes, your small intestine.  A sigmoidoscopy to examine your lower colon.  A colonoscopy to examine your entire colon. TREATMENT  Treatment will depend on the severity of your constipation and what is causing it. Some dietary treatments include drinking more fluids and eating more fiber-rich  foods. Lifestyle treatments may include regular exercise. If these diet and lifestyle recommendations do not help, your caregiver may recommend taking over-the-counter laxative medicines to help you have bowel movements. Prescription medicines may be prescribed if over-the-counter medicines do not work.  HOME CARE INSTRUCTIONS   Increase dietary fiber in your diet, such as fruits, vegetables, whole grains, and beans. Limit high-fat and processed sugars in your diet, such as [place] fries, hamburgers, cookies, candies, and soda.   A fiber supplement may be added to your diet if you cannot get enough fiber from foods.   Drink enough fluids to keep your urine clear or pale yellow.   Exercise regularly or as directed by your caregiver.   Go to the restroom when you have the urge to go. Do not hold it.  Only take medicines as directed by your caregiver. Do not take other medicines for constipation without talking to your caregiver first. SEEK IMMEDIATE MEDICAL CARE IF:   You have bright red blood in your stool.   Your constipation lasts for more than 4 days or gets worse.   You have abdominal or rectal pain.   You have thin, pencil-like stools.  You have unexplained weight loss. MAKE SURE YOU:   Understand these instructions.  Will watch your condition.  Will get help right away if you are not doing well or get worse. Document Released: 04/07/2004 Document Revised: 10/02/2011 Document Reviewed: 06/13/2011 [facility] Patient Information 2014 [place], [place].

## 2013-06-13 ENCOUNTER — Ambulatory Visit: Payer: BC Managed Care – PPO | Admitting: Family Medicine

## 2013-06-15 NOTE — Assessment & Plan Note (Signed)
No recent exacerbations.  

## 2013-06-15 NOTE — Assessment & Plan Note (Signed)
Is using Optifast at [facility] and has good weight loss, will continue same

## 2013-06-15 NOTE — Assessment & Plan Note (Signed)
Stable on current dose of Synthroid. 

## 2013-06-15 NOTE — Assessment & Plan Note (Signed)
Improving with dietary changes and current meds

## 2013-06-15 NOTE — Assessment & Plan Note (Signed)
Well controlled, no change in meds 

## 2013-07-13 ENCOUNTER — Other Ambulatory Visit: Payer: Self-pay | Admitting: Family Medicine

## 2013-07-14 ENCOUNTER — Telehealth: Payer: Self-pay | Admitting: Family Medicine

## 2013-07-14 NOTE — Telephone Encounter (Signed)
refill-synthroid tablet. Take one tablet by mouth once daily before breakfast. Qty 90 last fill 9.22.14

## 2013-07-14 NOTE — Telephone Encounter (Signed)
Rx request to pharmacy/[facility]  

## 2013-07-15 MED ORDER — SYNTHROID 100 MCG PO TABS: 100.0000 ug | ORAL_TABLET | Freq: Every day | ORAL | Status: DC

## 2013-08-10 ENCOUNTER — Other Ambulatory Visit: Payer: Self-pay | Admitting: Family Medicine

## 2013-08-13 ENCOUNTER — Telehealth: Payer: Self-pay | Admitting: Family Medicine

## 2013-08-13 MED ORDER — FAMOTIDINE 20 MG PO TABS
20.0000 mg | ORAL_TABLET | Freq: Every day | ORAL | Status: DC
Start: 2013-08-13 — End: 2014-10-27

## 2013-08-13 NOTE — Telephone Encounter (Signed)
Request refill on famotidine 20mg , take 1 tab by mouth at bedtime Qty 90 Last fill date 05-13-2013

## 2013-08-21 ENCOUNTER — Telehealth: Payer: Self-pay | Admitting: Family Medicine

## 2013-08-21 MED ORDER — LORATADINE 10 MG PO TABS: 10.0000 mg | ORAL_TABLET | Freq: Every day | ORAL | Status: DC

## 2013-08-21 NOTE — Telephone Encounter (Signed)
refill-loratadine 10mg  tablet. Take one tablet by mouth once daily. Qty 30 last fill 12.18.14

## 2013-08-28 ENCOUNTER — Telehealth: Payer: Self-pay | Admitting: Family Medicine

## 2013-08-28 DIAGNOSIS — I10 Essential (primary) hypertension: Secondary | ICD-10-CM

## 2013-08-28 MED ORDER — METOPROLOL TARTRATE 25 MG PO TABS: 25.0000 mg | ORAL_TABLET | Freq: Two times a day (BID) | ORAL | Status: DC

## 2013-08-28 NOTE — Telephone Encounter (Signed)
Refill- metoprolol tartrate 25mg  tab

## 2013-09-10 ENCOUNTER — Telehealth: Payer: Self-pay | Admitting: Family Medicine

## 2013-09-10 MED ORDER — HYDROCHLOROTHIAZIDE 25 MG PO TABS: 25.0000 mg | ORAL_TABLET | Freq: Every day | ORAL | Status: DC

## 2013-09-10 NOTE — Telephone Encounter (Signed)
Refill-hydrochlorothiazide

## 2013-11-01 ENCOUNTER — Other Ambulatory Visit: Payer: Self-pay | Admitting: Family Medicine

## 2013-11-03 ENCOUNTER — Encounter: Payer: Self-pay | Admitting: Family

## 2013-11-03 ENCOUNTER — Ambulatory Visit (INDEPENDENT_AMBULATORY_CARE_PROVIDER_SITE_OTHER): Payer: BC Managed Care – PPO | Admitting: Family

## 2013-11-03 VITALS — BP 118/78 | HR 82 | Temp 97.9°F | Ht 63.0 in | Wt 258.1 lb

## 2013-11-03 DIAGNOSIS — J309 Allergic rhinitis, unspecified: Secondary | ICD-10-CM

## 2013-11-03 DIAGNOSIS — J45909 Unspecified asthma, uncomplicated: Secondary | ICD-10-CM

## 2013-11-03 MED ORDER — ALBUTEROL SULFATE HFA 108 (90 BASE) MCG/ACT IN AERS: 2.0000 | INHALATION_SPRAY | Freq: Four times a day (QID) | RESPIRATORY_TRACT | Status: DC | PRN

## 2013-11-03 MED ORDER — FLUTICASONE PROPIONATE 50 MCG/ACT NA SUSP: 2.0000 | Freq: Every day | NASAL | Status: DC

## 2013-11-03 MED ORDER — CETIRIZINE HCL 10 MG PO TABS: 10.0000 mg | ORAL_TABLET | Freq: Every day | ORAL | Status: DC

## 2013-11-03 NOTE — Assessment & Plan Note (Signed)
Recommended albuterol prn.

## 2013-11-03 NOTE — Progress Notes (Signed)
Pre visit review using our clinic review tool, if applicable. No additional management support is needed unless otherwise documented below in the visit note. 

## 2013-11-03 NOTE — Assessment & Plan Note (Signed)
No clear bacterial infection at this point.  Consider abx if symptoms worsen or if they fail to improve in next 3-4 days.  Recommended:  Stop claritin, start zyrtec. Stop afrin, start nasal saline irrigation twice daily and flonase once daily. Call if your symptoms worsen, or if not improved in 3-4 days.

## 2013-11-03 NOTE — Patient Instructions (Signed)
Stop claritin, start zyrtec. Stop afrin, start nasal saline irrigation twice daily and flonase once daily. Call if your symptoms worsen, or if not improved in 3-4 days.  Hay Fever  Hay fever is a type of allergy that people have to things like grass, animals, or pollen from plants and flowers. It cannot be passed from one person to another. You cannot cure hay fever, but there are things that may help relieve your problems (symptoms). HOME CARE  Avoid the things that may be causing your problems.  Take all medicine as told by your doctor. GET HELP RIGHT AWAY IF:  You have asthma, a cough, and you start making whistling sounds when breathing (wheezing).  Your tongue or lips are puffy (swollen).  You have trouble breathing.  You feel lightheaded or like you will pass out (faint).  You have a fever.  Your problems are getting worse and your medicine is not helping.  Your treatment was working, but your problems have come back.  You are stuffed up (congested) and have pressure in your face.  You have a headache.  You have cold sweats. MAKE SURE YOU:  Understand these instructions.  Will watch your condition.  Will get help right away if you are not doing well or get worse. Document Released: 11/09/2010 Document Revised: 10/02/2011 Document Reviewed: 11/09/2010 [facility] Patient Information 2014 [place], [place].

## 2013-11-03 NOTE — Progress Notes (Signed)
Subjective:    [name], female    DOB: [birth year], 63 y.o.   MRN: [id number]  HPI  Rebecca Brooks is a 63 yr old female who presents today with chief complaint of nasal congestion. Reports that she has some hoarseness, cough and associated tightness with breathing.  Reports that she took mucinex dm, tussin DM and afrin.  She notes some improvement in her symptoms with these measures.  Notes post nasal drip.  Has green/yellow mucous.  Reports that her symptoms started 5 days ago. She denies associated fever.  Has had some chills/warm feeling. Has not taken her temperature.  She continues claritin.  Not currently taking flonase.  Review of Systems    see HPI  Past Medical History  Diagnosis Date  . Pneumonia     this October from which she has said inhailer  . Obesity   . Chicken pox as a child  . Measles as a child  . Mumps as a child  . Arthritis   . GERD (gastroesophageal reflux disease)   . Hyperlipidemia   . Hypertension   . Hypothyroidism   . Palpitations 08/01/2011  . Anemia 08/01/2011  . Hypokalemia 08/01/2011  . Leukocytosis 08/01/2011  . Low back pain radiating to right leg     intermittent and occasional numbness of skin over right hip in certain positions  . Candidal skin infection 08/11/2011  . UTI (lower urinary tract infection) 09/17/2011  . Knee pain, right 10/18/2011  . Shoulder pain, bilateral 11/01/2011  . Recurrent epistaxis 02/20/2012  . Preventative health care 12/13/2012    History   Social History  . Marital Status: Married    Spouse Name: N/A    Number of Children: N/A  . Years of Education: N/A   Occupational History  . Not on file.   Social History Main Topics  . Smoking status: Former Smoker -- 0.50 packs/day for 5 years    Types: Cigarettes    Quit date: 07/25/1971  . Smokeless tobacco: Never Used  . Alcohol Use: 8.4 oz/week    14 Glasses of wine per week  . Drug Use: No  . Sexual Activity: No   Other Topics Concern  . Not on  file   Social History Narrative  . No narrative on file    Past Surgical History  Procedure Laterality Date  . Thyroidectomy      total for benign tumor, Parathyroid spared  . Tonsillectomy    . Cesarean section      X 3  . Joint replacement  2006    right hip replacement, secondary to congenital  hip defect  . Hernia repair  63 yrs old    right inguinal    Family History  Problem Relation Age of Onset  . Cancer Mother 62    lung/ smoker  . Heart disease Mother   . Hearing loss Mother     tachycardia  . Mental illness Mother     anxiety, claustrophobia  . Osteoarthritis Father   . Hyperlipidemia Father   . Hyperlipidemia Sister   . Migraines Sister   . Hypertension Sister   . Cancer Maternal Grandmother     colon  . Heart attack Maternal Grandfather   . Aneurysm Paternal Grandfather     abdominal  . Heart disease Paternal Grandfather     AAA rupture, smoker    Allergies  Allergen Reactions  . Accupril [Quinapril Hcl] Cough    Current Outpatient Prescriptions on  File Prior to Visit  Medication Sig Dispense Refill  . allopurinol (ZYLOPRIM) 100 MG tablet TAKE 1 TABLET BY MOUTH ONCE DAILY  90 tablet  1  . CVS VITAMIN D3 1000 UNITS capsule TAKE 1 CAPSULE BY MOUTH ONCE DAILY  90 capsule  1  . famotidine (PEPCID) 20 MG tablet Take 1 tablet (20 mg total) by mouth daily.  90 tablet  1  . furosemide (LASIX) 20 MG tablet Take 1 tablet (20 mg total) by mouth daily as needed. 1 tablet daily as needed  30 tablet  3  . hydrochlorothiazide (HYDRODIURIL) 25 MG tablet Take 1 tablet (25 mg total) by mouth daily.  90 tablet  1  . metoprolol tartrate (LOPRESSOR) 25 MG tablet Take 1 tablet (25 mg total) by mouth 2 (two) times daily.  180 tablet  1  . pantoprazole (PROTONIX) 40 MG tablet TAKE 1 TABLET (40 MG TOTAL) BY MOUTH DAILY.  90 tablet  1  . potassium chloride SA (K-DUR,KLOR-CON) 20 MEQ tablet Take 1 tablet (20 mEq total) by mouth daily as needed (lasix use).  30 tablet  3  .  simvastatin (ZOCOR) 40 MG tablet TAKE 1 TABLET EVERY EVENING  90 tablet  0  . SYNTHROID 100 MCG tablet Take 1 tablet (100 mcg total) by mouth daily before breakfast.  90 tablet  1  . traMADol (ULTRAM) 50 MG tablet Take 50 mg by mouth 2 (two) times daily.      . [DISCONTINUED] budesonide-formoterol (SYMBICORT) 160-4.5 MCG/ACT inhaler Inhale 2 puffs into the lungs 2 (two) times daily as needed. Patient has sample, is not using  1 Inhaler  0   No current facility-administered medications on file prior to visit.    BP 118/78  Pulse 82  Temp(Src) 97.9 F (36.6 C) (Oral)  Ht 5\' 3"  (1.6 m)  Wt 258 lb 1.3 oz (117.064 kg)  BMI 45.73 kg/m2  SpO2 99%    Objective:   Physical Exam  Constitutional: She appears well-developed and well-nourished. No distress.  HENT:  Right Ear: Tympanic membrane and ear canal normal.  Left Ear: Tympanic membrane and ear canal normal.  Mouth/Throat: No oropharyngeal exudate, posterior oropharyngeal edema or posterior oropharyngeal erythema.  Cardiovascular: Normal rate and regular rhythm.   No murmur heard. Pulmonary/Chest: Effort normal. She has wheezes in the left middle field. She has no rhonchi. She has no rales.  Psychiatric: She has a normal mood and affect. Her speech is normal and behavior is normal.          Assessment & Plan:

## 2013-11-07 ENCOUNTER — Telehealth: Payer: Self-pay | Admitting: Family Medicine

## 2013-11-07 MED ORDER — METHYLPREDNISOLONE 4 MG PO KIT: PACK | ORAL | Status: DC

## 2013-11-07 NOTE — Telephone Encounter (Signed)
Please send her in a Medrol dose pak, when she had trouble a few years ago she got very bad, if no improvement then next week she may need to come in or have an antibiotic if still no improvement

## 2013-11-07 NOTE — Telephone Encounter (Signed)
Patient states that she was in earlier this week and is not feeling any better. She says that she has been taking zyrtec but still has no energy and "just doesn't feel good". She would like to know what else she could do?

## 2013-11-07 NOTE — Telephone Encounter (Signed)
Pt informed and RX sent to pharmacy  

## 2013-12-03 ENCOUNTER — Encounter: Payer: Self-pay | Admitting: Family Medicine

## 2013-12-11 ENCOUNTER — Other Ambulatory Visit: Payer: Self-pay | Admitting: Family Medicine

## 2014-01-12 ENCOUNTER — Encounter: Payer: Self-pay | Admitting: Family

## 2014-01-12 ENCOUNTER — Ambulatory Visit (INDEPENDENT_AMBULATORY_CARE_PROVIDER_SITE_OTHER): Payer: BC Managed Care – PPO | Admitting: Family

## 2014-01-12 ENCOUNTER — Ambulatory Visit (HOSPITAL_BASED_OUTPATIENT_CLINIC_OR_DEPARTMENT_OTHER)
Admission: RE | Admit: 2014-01-12 | Discharge: 2014-01-12 | Disposition: A | Discharge status: Home / Self Care | Payer: BC Managed Care – PPO | Source: Ambulatory Visit | Attending: Family | Admitting: Family

## 2014-01-12 VITALS — BP 110/70 | HR 63 | Temp 98.0°F | Ht 63.0 in | Wt 246.0 lb

## 2014-01-12 DIAGNOSIS — M549 Dorsalgia, unspecified: Secondary | ICD-10-CM | POA: Insufficient documentation

## 2014-01-12 DIAGNOSIS — M546 Pain in thoracic spine: Secondary | ICD-10-CM | POA: Insufficient documentation

## 2014-01-12 DIAGNOSIS — M4120 Other idiopathic scoliosis, site unspecified: Secondary | ICD-10-CM | POA: Insufficient documentation

## 2014-01-12 MED ORDER — MELOXICAM 7.5 MG PO TABS: 7.5000 mg | ORAL_TABLET | Freq: Every day | ORAL | Status: DC

## 2014-01-12 MED ORDER — CYCLOBENZAPRINE HCL 5 MG PO TABS: 5.0000 mg | ORAL_TABLET | Freq: Every evening | ORAL | Status: DC | PRN

## 2014-01-12 NOTE — Progress Notes (Signed)
Subjective:    [name], female    DOB: 1950/09/22, 63 y.o.   MRN: [id number]  HPI [name] is a 63 yo old female here today with chief c/o back pain. Began having back pain before Easter in left middle back area described as a dull ache (5/10)  that is present most of the time with worsening at night. Pain does not radiate.Cannot remember injuring her back.Takes 400 mg of ibuprofen in am and 400 mg in evening for her arthritis and it also seems to relieve back pain. Also takes tramadol 100 mg bid- she does not know if this is relieving back pain because she tales this every day. Pain is also relieved slightly by applying pressure to area. Has lost 80 lbs since October through program at [facility]. Has taken steroid dose pack recently for knee pain without relief of back pain.  Review of Systems  Constitutional: Positive for chills. Negative for fever and fatigue.       States he alternates at times between being chilled and having hot flashes.  Genitourinary: Positive for flank pain. Negative for dysuria, frequency, hematuria and difficulty urinating.       Past Medical History  Diagnosis Date  . Pneumonia     this October from which she has said inhailer  . Obesity   . Chicken pox as a child  . Measles as a child  . Mumps as a child  . Arthritis   . GERD (gastroesophageal reflux disease)   . Hyperlipidemia   . Hypertension   . Hypothyroidism   . Palpitations 08/01/2011  . Anemia 08/01/2011  . Hypokalemia 08/01/2011  . Leukocytosis 08/01/2011  . Low back pain radiating to right leg     intermittent and occasional numbness of skin over right hip in certain positions  . Candidal skin infection 08/11/2011  . UTI (lower urinary tract infection) 09/17/2011  . Knee pain, right 10/18/2011  . Shoulder pain, bilateral 11/01/2011  . Recurrent epistaxis 02/20/2012  . Preventative health care 12/13/2012    History   Social History  . Marital Status: Married    Spouse Name:  N/A    Number of Children: N/A  . Years of Education: N/A   Occupational History  . Not on file.   Social History Main Topics  . Smoking status: Former Smoker -- 0.50 packs/day for 5 years    Types: Cigarettes    Quit date: 07/25/1971  . Smokeless tobacco: Never Used  . Alcohol Use: 8.4 oz/week    14 Glasses of wine per week  . Drug Use: No  . Sexual Activity: No   Other Topics Concern  . Not on file   Social History Narrative  . No narrative on file    Past Surgical History  Procedure Laterality Date  . Thyroidectomy      total for benign tumor, Parathyroid spared  . Tonsillectomy    . Cesarean section      X 3  . Joint replacement  2006    right hip replacement, secondary to congenital  hip defect  . Hernia repair  63 yrs old    right inguinal    Family History  Problem Relation Age of Onset  . Cancer Mother 64    lung/ smoker  . Heart disease Mother   . Hearing loss Mother     tachycardia  . Mental illness Mother     anxiety, claustrophobia  . Osteoarthritis Father   .  Hyperlipidemia Father   . Hyperlipidemia Sister   . Migraines Sister   . Hypertension Sister   . Cancer Maternal Grandmother     colon  . Heart attack Maternal Grandfather   . Aneurysm Paternal Grandfather     abdominal  . Heart disease Paternal Grandfather     AAA rupture, smoker    Allergies  Allergen Reactions  . Accupril [Quinapril Hcl] Cough    Current Outpatient Prescriptions on File Prior to Visit  Medication Sig Dispense Refill  . albuterol (PROVENTIL HFA;VENTOLIN HFA) 108 (90 BASE) MCG/ACT inhaler Inhale 2 puffs into the lungs every 6 (six) hours as needed for wheezing or shortness of breath.  1 Inhaler  0  . cetirizine (ZYRTEC) 10 MG tablet Take 1 tablet (10 mg total) by mouth daily.  30 tablet  11  . CVS VITAMIN D3 1000 UNITS capsule TAKE 1 CAPSULE BY MOUTH ONCE DAILY  90 capsule  1  . famotidine (PEPCID) 20 MG tablet Take 1 tablet (20 mg total) by mouth daily.  90  tablet  1  . fluticasone (FLONASE) 50 MCG/ACT nasal spray Place 2 sprays into both nostrils daily.  16 g  2  . furosemide (LASIX) 20 MG tablet Take 1 tablet (20 mg total) by mouth daily as needed. 1 tablet daily as needed  30 tablet  3  . hydrochlorothiazide (HYDRODIURIL) 25 MG tablet Take 1 tablet (25 mg total) by mouth daily.  90 tablet  1  . methylPREDNISolone (MEDROL DOSEPAK) 4 MG tablet follow package directions  21 tablet  0  . metoprolol tartrate (LOPRESSOR) 25 MG tablet Take 1 tablet (25 mg total) by mouth 2 (two) times daily.  180 tablet  1  . pantoprazole (PROTONIX) 40 MG tablet TAKE 1 TABLET EVERY DAY  90 tablet  0  . potassium chloride SA (K-DUR,KLOR-CON) 20 MEQ tablet Take 1 tablet (20 mEq total) by mouth daily as needed (lasix use).  30 tablet  3  . simvastatin (ZOCOR) 40 MG tablet TAKE 1 TABLET EVERY EVENING  90 tablet  0  . traMADol (ULTRAM) 50 MG tablet Take 50 mg by mouth 2 (two) times daily.      Marland Kitchen allopurinol (ZYLOPRIM) 100 MG tablet TAKE 1 TABLET BY MOUTH ONCE DAILY  90 tablet  1  . [DISCONTINUED] budesonide-formoterol (SYMBICORT) 160-4.5 MCG/ACT inhaler Inhale 2 puffs into the lungs 2 (two) times daily as needed. Patient has sample, is not using  1 Inhaler  0   No current facility-administered medications on file prior to visit.    BP 110/70  Pulse 63  Temp(Src) 98 F (36.7 C) (Oral)  Ht 5\' 3"  (1.6 m)  Wt 246 lb (111.585 kg)  BMI 43.59 kg/m2  SpO2 97%    Objective:   Physical Exam  Constitutional: She is oriented to person, place, and time. No distress.  Obese.  Cardiovascular: Normal rate, regular rhythm and intact distal pulses.   Genitourinary:  No CVA tenderness.  Musculoskeletal: She exhibits edema (left ankle.).  Good ROM in back and left hip. Negative SLE bilaterally. Good strenght in lower extremities.  Neurological: She is alert and oriented to person, place, and time. She displays normal reflexes.  Skin: Skin is warm and dry. She is not  diaphoretic.  Psychiatric: She has a normal mood and affect. Her behavior is normal.          Assessment & Plan:  Patient seen by [facility] NP-student.  I have personally seen and examined patient  and agree with Ms. Whitmire's assessment and plan.

## 2014-01-12 NOTE — Patient Instructions (Addendum)
Please complete x ray on the first floor. Stop ibuprofen, start meloxicam once daily and flexeril as needed at bedtime. Call if symptoms worsen or if not improved in 2 weeks.

## 2014-01-12 NOTE — Progress Notes (Signed)
Pre visit review using our clinic review tool, if applicable. No additional management support is needed unless otherwise documented below in the visit note. 

## 2014-01-13 ENCOUNTER — Telehealth: Payer: Self-pay | Admitting: *Deleted

## 2014-01-13 NOTE — Telephone Encounter (Signed)
Message copied by Julieta Bellini on Tue Jan 13, 2014  4:48 PM ------      Message from: O'SULLIVAN, MELISSA      Created: Tue Jan 13, 2014  2:59 PM       X ray notes scoliosis and mild degenerative changes of the spine. ------

## 2014-01-13 NOTE — Telephone Encounter (Signed)
Patient would like to know what else needs to be done?

## 2014-01-13 NOTE — Telephone Encounter (Signed)
Can refer to sports med, ortho or she can see a chiropractor if she is still inpain

## 2014-01-14 ENCOUNTER — Other Ambulatory Visit: Payer: Self-pay | Admitting: Family Medicine

## 2014-01-14 LAB — URINE CULTURE

## 2014-01-14 NOTE — Telephone Encounter (Signed)
Left detailed message on patient's home vm, asking pt to call back with her preference.

## 2014-01-15 ENCOUNTER — Telehealth: Payer: Self-pay | Admitting: Family

## 2014-01-15 MED ORDER — CIPROFLOXACIN HCL 250 MG PO TABS: 250.0000 mg | ORAL_TABLET | Freq: Two times a day (BID) | ORAL | Status: DC

## 2014-01-15 NOTE — Telephone Encounter (Signed)
LMOM with contact name and number [for return call, if needed] RE: results and further provider instructions/SLS  

## 2014-01-15 NOTE — Telephone Encounter (Signed)
Urine culture grew some bacteria.  Advise pt to take cipro x 3 days. rx has been sent.

## 2014-01-29 ENCOUNTER — Other Ambulatory Visit: Payer: Self-pay | Admitting: Family Medicine

## 2014-02-23 ENCOUNTER — Other Ambulatory Visit: Payer: Self-pay | Admitting: Family Medicine

## 2014-03-10 ENCOUNTER — Other Ambulatory Visit: Payer: Self-pay | Admitting: Family Medicine

## 2014-04-07 ENCOUNTER — Telehealth: Payer: Self-pay

## 2014-04-07 MED ORDER — CHOLECALCIFEROL 25 MCG (1000 UT) PO CAPS: 1000.0000 [IU] | ORAL_CAPSULE | Freq: Every day | ORAL | Status: DC

## 2014-04-07 NOTE — Telephone Encounter (Signed)
Please inform pt that we sent in a 90 day supply of Vitamin D but patient needs an appt with Dr Charlett Blake to continue getting refills

## 2014-04-08 NOTE — Telephone Encounter (Signed)
Left detailed message informing patient of this. °

## 2014-05-08 ENCOUNTER — Other Ambulatory Visit: Payer: Self-pay

## 2014-05-18 ENCOUNTER — Other Ambulatory Visit: Payer: Self-pay | Admitting: Family Medicine

## 2014-05-26 ENCOUNTER — Other Ambulatory Visit: Payer: Self-pay | Admitting: Family Medicine

## 2014-05-27 ENCOUNTER — Encounter: Payer: Self-pay | Admitting: Family Medicine

## 2014-05-27 NOTE — Telephone Encounter (Signed)
Rx sent to the pharmacy by e-script.//AB/CMA 

## 2014-05-28 NOTE — Telephone Encounter (Signed)
Please advise? Is this things that need to be done when referrals are placed?

## 2014-06-01 NOTE — Telephone Encounter (Signed)
Faxed to [phone]

## 2014-06-08 ENCOUNTER — Other Ambulatory Visit: Payer: Self-pay | Admitting: Family Medicine

## 2014-07-15 ENCOUNTER — Other Ambulatory Visit: Payer: Self-pay | Admitting: Family Medicine

## 2014-07-17 ENCOUNTER — Other Ambulatory Visit: Payer: Self-pay | Admitting: Family Medicine

## 2014-07-20 NOTE — Telephone Encounter (Signed)
Rx sent to the pharmacy by e-script.//AB/CMA 

## 2014-08-19 ENCOUNTER — Other Ambulatory Visit: Payer: Self-pay | Admitting: Family Medicine

## 2014-08-25 ENCOUNTER — Other Ambulatory Visit: Payer: Self-pay | Admitting: Family Medicine

## 2014-08-25 NOTE — Telephone Encounter (Signed)
Rx sent for 30 day supply.  It has been over a year since her last physical, please schedule.

## 2014-09-04 ENCOUNTER — Other Ambulatory Visit: Payer: Self-pay | Admitting: Family Medicine

## 2014-09-18 ENCOUNTER — Other Ambulatory Visit: Payer: Self-pay | Admitting: Family Medicine

## 2014-10-04 ENCOUNTER — Other Ambulatory Visit: Payer: Self-pay | Admitting: Family Medicine

## 2014-10-09 ENCOUNTER — Other Ambulatory Visit: Payer: Self-pay | Admitting: Family Medicine

## 2014-10-13 ENCOUNTER — Other Ambulatory Visit: Payer: Self-pay | Admitting: Family Medicine

## 2014-10-13 NOTE — Telephone Encounter (Signed)
med denied, pt has not seen [place] since 2014.

## 2014-10-19 ENCOUNTER — Other Ambulatory Visit: Payer: Self-pay | Admitting: Family Medicine

## 2014-10-19 ENCOUNTER — Telehealth: Payer: Self-pay | Admitting: Family Medicine

## 2014-10-19 NOTE — Telephone Encounter (Signed)
Caller name: [place], Dreese Relation to pt: self  Call back number: [phone] Pharmacy: [id number] - , [place]  Reason for call:  Pt requesting a refill pantoprazole (PROTONIX) 40 MG tablet and hydrochlorothiazide (HYDRODIURIL) 25 MG table

## 2014-10-20 NOTE — Telephone Encounter (Signed)
Medication Detail      Disp Refills Start End     pantoprazole (PROTONIX) 40 MG tablet 30 tablet 0 10/20/2014     Sig: TAKE 1 TABLET BY MOUTH EVERY DAY    Notes to Pharmacy: APPOINTMENT SCHEDULED BUT PATIENT OUT OF MEDICATION    E-Prescribing Status: Receipt confirmed by pharmacy (10/20/2014 7:16 AM EDT)   Pharmacy    [id number] - [place], [address] AT [address]   Medication Detail      Disp Refills Start End     hydrochlorothiazide (HYDRODIURIL) 25 MG tablet 30 tablet 0 10/20/2014     Sig: TAKE 1 TABLET BY MOUTH EVERY DAY    Notes to Pharmacy: APPOINTMENT SCHEDULE BUT PATIENT IS OUT OF MEDICATION    E-Prescribing Status: Receipt confirmed by pharmacy (10/20/2014 7:16 AM EDT)     Rx have been sent to pharmacy by PCP Medical Assistant 03.29.16/SLS

## 2014-10-27 ENCOUNTER — Encounter: Payer: Self-pay | Admitting: Family Medicine

## 2014-10-27 ENCOUNTER — Ambulatory Visit (INDEPENDENT_AMBULATORY_CARE_PROVIDER_SITE_OTHER): Payer: No Typology Code available for payment source | Admitting: Family Medicine

## 2014-10-27 VITALS — BP 132/84 | HR 69 | Temp 98.0°F | Ht 63.0 in | Wt 248.2 lb

## 2014-10-27 DIAGNOSIS — I10 Essential (primary) hypertension: Secondary | ICD-10-CM

## 2014-10-27 DIAGNOSIS — E039 Hypothyroidism, unspecified: Secondary | ICD-10-CM

## 2014-10-27 DIAGNOSIS — G2581 Restless legs syndrome: Secondary | ICD-10-CM | POA: Diagnosis not present

## 2014-10-27 DIAGNOSIS — G4700 Insomnia, unspecified: Secondary | ICD-10-CM

## 2014-10-27 DIAGNOSIS — K219 Gastro-esophageal reflux disease without esophagitis: Secondary | ICD-10-CM

## 2014-10-27 DIAGNOSIS — E669 Obesity, unspecified: Secondary | ICD-10-CM | POA: Diagnosis not present

## 2014-10-27 DIAGNOSIS — M069 Rheumatoid arthritis, unspecified: Secondary | ICD-10-CM

## 2014-10-27 MED ORDER — ROPINIROLE HCL 0.5 MG PO TABS: 0.5000 mg | ORAL_TABLET | Freq: Every evening | ORAL | Status: DC | PRN

## 2014-10-27 MED ORDER — FAMOTIDINE 20 MG PO TABS: 20.0000 mg | ORAL_TABLET | Freq: Every evening | ORAL | Status: DC | PRN

## 2014-10-27 NOTE — Progress Notes (Signed)
Pre visit review using our clinic review tool, if applicable. No additional management support is needed unless otherwise documented below in the visit note. 

## 2014-10-27 NOTE — Assessment & Plan Note (Signed)
Well controlled, no changes to meds. Encouraged heart healthy diet such as the DASH diet and exercise as tolerated.  °

## 2014-10-27 NOTE — Patient Instructions (Addendum)
Consider a probiotic daily such as [employer] or [employer] health or order at [email or web address] 10 strain probiotic by [place]   Can try Melatonin (2-10 mg) or L tryptophan for sleep  Release of Records: [facility]  Encouraged good sleep hygiene such as dark, quiet room. No blue/green glowing lights such as computer screens in bedroom. No alcohol or stimulants in evening. Cut down on caffeine as able. Regular exercise is helpful but not just prior to bed time.  Insomnia Insomnia is frequent trouble falling and/or staying asleep. Insomnia can be a long term problem or a short term problem. Both are common. Insomnia can be a short term problem when the wakefulness is related to a certain stress or worry. Long term insomnia is often related to ongoing stress during waking hours and/or poor sleeping habits. Overtime, sleep deprivation itself can make the problem worse. Every little thing feels more severe because you are overtired and your ability to cope is decreased. CAUSES   Stress, anxiety, and depression.  Poor sleeping habits.  Distractions such as TV in the bedroom.  Naps close to bedtime.  Engaging in emotionally charged conversations before bed.  Technical reading before sleep.  Alcohol and other sedatives. They may make the problem worse. They can hurt normal sleep patterns and normal dream activity.  Stimulants such as caffeine for several hours prior to bedtime.  Pain syndromes and shortness of breath can cause insomnia.  Exercise late at night.  Changing time zones may cause sleeping problems (jet lag). It is sometimes helpful to have someone observe your sleeping patterns. They should look for periods of not breathing during the night (sleep apnea). They should also look to see how long those periods last. If you live alone or observers are uncertain, you can also be observed at a sleep clinic where your sleep patterns will be  professionally monitored. Sleep apnea requires a checkup and treatment. Give your caregivers your medical history. Give your caregivers observations your family has made about your sleep.  SYMPTOMS   Not feeling rested in the morning.  Anxiety and restlessness at bedtime.  Difficulty falling and staying asleep. TREATMENT   Your caregiver may prescribe treatment for an underlying medical disorders. Your caregiver can give advice or help if you are using alcohol or other drugs for self-medication. Treatment of underlying problems will usually eliminate insomnia problems.  Medications can be prescribed for short time use. They are generally not recommended for lengthy use.  Over-the-counter sleep medicines are not recommended for lengthy use. They can be habit forming.  You can promote easier sleeping by making lifestyle changes such as:  Using relaxation techniques that help with breathing and reduce muscle tension.  Exercising earlier in the day.  Changing your diet and the time of your last meal. No night time snacks.  Establish a regular time to go to bed.  Counseling can help with stressful problems and worry.  Soothing music and white noise may be helpful if there are background noises you cannot remove.  Stop tedious detailed work at least one hour before bedtime. HOME CARE INSTRUCTIONS   Keep a diary. Inform your caregiver about your progress. This includes any medication side effects. See your caregiver regularly. Take note of:  Times when you are asleep.  Times when you are awake during the night.  The quality of your sleep.  How you feel the next day. This information will help your caregiver care for you.  Get out  of bed if you are still awake after 15 minutes. Read or do some quiet activity. Keep the lights down. Wait until you feel sleepy and go back to bed.  Keep regular sleeping and waking hours. Avoid naps.  Exercise regularly.  Avoid distractions at  bedtime. Distractions include watching television or engaging in any intense or detailed activity like attempting to balance the household checkbook.  Develop a bedtime ritual. Keep a familiar routine of bathing, brushing your teeth, climbing into bed at the same time each night, listening to soothing music. Routines increase the success of falling to sleep faster.  Use relaxation techniques. This can be using breathing and muscle tension release routines. It can also include visualizing peaceful scenes. You can also help control troubling or intruding thoughts by keeping your mind occupied with boring or repetitive thoughts like the old concept of counting sheep. You can make it more creative like imagining planting one beautiful flower after another in your backyard garden.  During your day, work to eliminate stress. When this is not possible use some of the previous suggestions to help reduce the anxiety that accompanies stressful situations. MAKE SURE YOU:   Understand these instructions.  Will watch your condition.  Will get help right away if you are not doing well or get worse. Document Released: 07/07/2000 Document Revised: 10/02/2011 Document Reviewed: 08/07/2007 [facility] Patient Information 2015 [place], [place]. This information is not intended to replace advice given to you by your health care provider. Make sure you discuss any questions you have with your health care provider.

## 2014-11-01 ENCOUNTER — Encounter: Payer: Self-pay | Admitting: Family Medicine

## 2014-11-01 DIAGNOSIS — G4700 Insomnia, unspecified: Secondary | ICD-10-CM

## 2014-11-01 DIAGNOSIS — G2581 Restless legs syndrome: Secondary | ICD-10-CM

## 2014-11-01 HISTORY — DX: Restless legs syndrome: G25.81

## 2014-11-01 HISTORY — DX: Insomnia, unspecified: G47.00

## 2014-11-01 NOTE — Assessment & Plan Note (Signed)
Is following with a [facility] weight loss Rebecca Brooks, has lost 80# since October of 2014, will continue the same

## 2014-11-01 NOTE — Assessment & Plan Note (Signed)
Avoid offending foods, start probiotics. Do not eat large meals in late evening and consider raising head of bed.  

## 2014-11-01 NOTE — Progress Notes (Signed)
Rebecca Brooks  [id number] [birth year] 11/01/2014      Progress Note-Follow Up  Subjective  Chief Complaint  Chief Complaint  Patient presents with  . Follow-up    Routine, Medication refills, Pt has labs completed at Dr. Arlean Hopping office about 2 weeks ago    HPI  Patient is a 64 y.o. female in today for routine medical care. Patient is in today for medication refills. Has not been in in quite some time and has been doing fairly well. Has had an 80 pound weight loss with help from [facility] program over the last 2 years. No recent illness. Continues to struggle with chronic pain but is following with rheumatology. Her biggest complaint today is of poor sleep. Mostly due to restless leg type symptoms and discomfort in her low back and legs. When she moves the pains to improve somewhat.  Past Medical History  Diagnosis Date  . Pneumonia     this October from which she has said inhailer  . Obesity   . Chicken pox as a child  . Measles as a child  . Mumps as a child  . Arthritis   . GERD (gastroesophageal reflux disease)   . Hyperlipidemia   . Hypertension   . Hypothyroidism   . Palpitations 08/01/2011  . Anemia 08/01/2011  . Hypokalemia 08/01/2011  . Leukocytosis 08/01/2011  . Low back pain radiating to right leg     intermittent and occasional numbness of skin over right hip in certain positions  . Candidal skin infection 08/11/2011  . UTI (lower urinary tract infection) 09/17/2011  . Knee pain, right 10/18/2011  . Shoulder pain, bilateral 11/01/2011  . Recurrent epistaxis 02/20/2012  . Preventative health care 12/13/2012  . Essential hypertension 07/17/2011    ACEI d/c 06/2011 for refractory cough> resolved   . RLS (restless legs syndrome) 11/01/2014  . Rheumatoid arthritis 09/10/2011    Bone on bone in knees Pain in L>R knees.     Past Surgical History  Procedure Laterality Date  . Thyroidectomy      total for benign tumor, Parathyroid spared  .  Tonsillectomy    . Cesarean section      X 3  . Joint replacement  2006    right hip replacement, secondary to congenital  hip defect  . Hernia repair  64 yrs old    right inguinal    Family History  Problem Relation Age of Onset  . Cancer Mother 68    lung/ smoker  . Heart disease Mother   . Hearing loss Mother     tachycardia  . Mental illness Mother     anxiety, claustrophobia  . Osteoarthritis Father   . Hyperlipidemia Father   . Hyperlipidemia Sister   . Migraines Sister   . Hypertension Sister   . Cancer Maternal Grandmother     colon  . Heart attack Maternal Grandfather   . Aneurysm Paternal Grandfather     abdominal  . Heart disease Paternal Grandfather     AAA rupture, smoker    History   Social History  . Marital Status: Married    Spouse Name: N/A  . Number of Children: N/A  . Years of Education: N/A   Occupational History  . Not on file.   Social History Main Topics  . Smoking status: Former Smoker -- 0.50 packs/day for 5 years    Types: Cigarettes    Quit date: 07/25/1971  . Smokeless tobacco: Never Used  .  Alcohol Use: 8.4 oz/week    14 Glasses of wine per week  . Drug Use: No  . Sexual Activity: No   Other Topics Concern  . Not on file   Social History Narrative    Current Outpatient Prescriptions on File Prior to Visit  Medication Sig Dispense Refill  . albuterol (PROVENTIL HFA;VENTOLIN HFA) 108 (90 BASE) MCG/ACT inhaler Inhale 2 puffs into the lungs every 6 (six) hours as needed for wheezing or shortness of breath. (Patient taking differently: Inhale 2 puffs into the lungs every 6 (six) hours as needed for wheezing or shortness of breath. PRN) 1 Inhaler 0  . cetirizine (ZYRTEC) 10 MG tablet Take 1 tablet (10 mg total) by mouth daily. 30 tablet 11  . CVS VITAMIN D3 1000 UNITS capsule TAKE ONE CAPSULE BY MOUTH EVERY DAY 90 capsule 0  . cyclobenzaprine (FLEXERIL) 5 MG tablet Take 1 tablet (5 mg total) by mouth at bedtime as needed for  muscle spasms. (Patient taking differently: Take 5 mg by mouth at bedtime as needed for muscle spasms. PRN) 14 tablet 0  . fluticasone (FLONASE) 50 MCG/ACT nasal spray Place 2 sprays into both nostrils daily. (Patient taking differently: Place 2 sprays into both nostrils daily as needed. ) 16 g 2  . hydrochlorothiazide (HYDRODIURIL) 25 MG tablet TAKE 1 TABLET BY MOUTH EVERY DAY 30 tablet 0  . metoprolol tartrate (LOPRESSOR) 25 MG tablet TAKE 1 TABLET BY MOUTH TWICE DAILY 60 tablet 0  . pantoprazole (PROTONIX) 40 MG tablet TAKE 1 TABLET BY MOUTH EVERY DAY 30 tablet 0  . simvastatin (ZOCOR) 40 MG tablet TAKE 1 TABLET BY MOUTH EVERY EVENING 90 tablet 0  . SYNTHROID 100 MCG tablet TAKE 1 TABLET BY MOUTH ONCE DAILY BEFORE BREAKFAST 90 tablet 1  . traMADol (ULTRAM) 50 MG tablet Take 50 mg by mouth 2 (two) times daily.    Marland Kitchen allopurinol (ZYLOPRIM) 100 MG tablet TAKE 1 TABLET BY MOUTH ONCE DAILY (Patient not taking: Reported on 10/27/2014) 90 tablet 1  . furosemide (LASIX) 20 MG tablet Take 1 tablet (20 mg total) by mouth daily as needed. 1 tablet daily as needed (Patient not taking: Reported on 10/27/2014) 30 tablet 3  . meloxicam (MOBIC) 7.5 MG tablet Take 1 tablet (7.5 mg total) by mouth daily. (Patient not taking: Reported on 10/27/2014) 14 tablet 0  . potassium chloride SA (K-DUR,KLOR-CON) 20 MEQ tablet Take 1 tablet (20 mEq total) by mouth daily as needed (lasix use). (Patient not taking: Reported on 10/27/2014) 30 tablet 3  . [DISCONTINUED] budesonide-formoterol (SYMBICORT) 160-4.5 MCG/ACT inhaler Inhale 2 puffs into the lungs 2 (two) times daily as needed. Patient has sample, is not using 1 Inhaler 0   No current facility-administered medications on file prior to visit.    Allergies  Allergen Reactions  . Accupril [Quinapril Hcl] Cough    Review of Systems  Review of Systems  Constitutional: Negative for fever and malaise/fatigue.  HENT: Negative for congestion.   Eyes: Negative for discharge.    Respiratory: Negative for shortness of breath.   Cardiovascular: Negative for chest pain, palpitations and leg swelling.  Gastrointestinal: Negative for nausea, abdominal pain and diarrhea.  Genitourinary: Positive for hematuria. Negative for dysuria.  Musculoskeletal: Positive for back pain and joint pain. Negative for falls.  Skin: Negative for rash.  Neurological: Negative for loss of consciousness and headaches.  Endo/Heme/Allergies: Negative for polydipsia.  Psychiatric/Behavioral: Negative for depression and suicidal ideas. The patient has insomnia. The patient is not nervous/anxious.  Objective  BP 132/84 mmHg  Pulse 69  Temp(Src) 98 F (36.7 C) (Oral)  Ht 5\' 3"  (1.6 m)  Wt 248 lb 4 oz (112.605 kg)  BMI 43.99 kg/m2  SpO2 97%  Physical Exam  Physical Exam  Constitutional: She is oriented to person, place, and time and well-developed, well-nourished, and in no distress. No distress.  HENT:  Head: Normocephalic and atraumatic.  Eyes: Conjunctivae are normal.  Neck: Neck supple. No thyromegaly present.  Cardiovascular: Normal rate, regular rhythm and normal heart sounds.   No murmur heard. Pulmonary/Chest: Effort normal and breath sounds normal. She has no wheezes.  Abdominal: She exhibits no distension and no mass.  Musculoskeletal: She exhibits no edema.  Lymphadenopathy:    She has no cervical adenopathy.  Neurological: She is alert and oriented to person, place, and time.  Skin: Skin is warm and dry. No rash noted. She is not diaphoretic.  Psychiatric: Memory, affect and judgment normal.    Lab Results  Component Value Date   TSH 3.09 12/02/2012   Lab Results  Component Value Date   WBC 12.8* 12/02/2012   HGB 13.8 12/02/2012   HCT 40.8 12/02/2012   MCV 90.1 12/02/2012   PLT 380.0 12/02/2012   Lab Results  Component Value Date   CREATININE 0.9 12/02/2012   BUN 16 12/02/2012   NA 138 12/02/2012   K 4.6 12/02/2012   CL 100 12/02/2012   CO2 30  12/02/2012   Lab Results  Component Value Date   ALT 21 12/02/2012   AST 21 12/02/2012   ALKPHOS 93 12/02/2012   BILITOT 0.6 12/02/2012   Lab Results  Component Value Date   CHOL 186 12/02/2012   Lab Results  Component Value Date   HDL 49.80 12/02/2012   Lab Results  Component Value Date   LDLCALC 102* 12/02/2012   Lab Results  Component Value Date   TRIG 169.0* 12/02/2012   Lab Results  Component Value Date   CHOLHDL 4 12/02/2012     Assessment & Plan  Essential hypertension Well controlled, no changes to meds. Encouraged heart healthy diet such as the DASH diet and exercise as tolerated.    GERD (gastroesophageal reflux disease) Avoid offending foods, start probiotics. Do not eat large meals in late evening and consider raising head of bed.    Obesity (BMI 30-39.9) Is following with a [facility] weight loss Charlena Cross, has lost 80# since October of 2014, will continue the same   RLS (restless legs syndrome) Started on Requip at 0.5 mg qhs and increase to 1 mg as needed. Encouraged adequate hydration   Hypothyroidism On Levothyroxine, continue to monitor

## 2014-11-01 NOTE — Assessment & Plan Note (Signed)
Encouraged good sleep hygiene such as dark, quiet room. No blue/green glowing lights such as computer screens in bedroom. No alcohol or stimulants in evening. Cut down on caffeine as able. Regular exercise is helpful but not just prior to bed time. May try Melatonin 

## 2014-11-01 NOTE — Assessment & Plan Note (Signed)
On Levothyroxine, continue to monitor 

## 2014-11-01 NOTE — Assessment & Plan Note (Signed)
Started on Requip at 0.5 mg qhs and increase to 1 mg as needed. Encouraged adequate hydration

## 2014-11-15 ENCOUNTER — Other Ambulatory Visit: Payer: Self-pay | Admitting: Family Medicine

## 2014-11-17 ENCOUNTER — Other Ambulatory Visit: Payer: Self-pay | Admitting: Family Medicine

## 2014-11-24 ENCOUNTER — Other Ambulatory Visit: Payer: Self-pay | Admitting: Family

## 2014-11-27 ENCOUNTER — Other Ambulatory Visit: Payer: Self-pay | Admitting: Family

## 2014-11-28 ENCOUNTER — Other Ambulatory Visit: Payer: Self-pay | Admitting: Family Medicine

## 2015-01-15 ENCOUNTER — Other Ambulatory Visit: Payer: Self-pay | Admitting: Family Medicine

## 2015-02-17 ENCOUNTER — Other Ambulatory Visit: Payer: Self-pay | Admitting: Obstetrics & Gynecology

## 2015-02-18 LAB — CYTOLOGY - PAP

## 2015-02-19 LAB — HM PAP SMEAR: HM Pap smear: NEGATIVE

## 2015-02-22 LAB — HM MAMMOGRAPHY: HM Mammogram: NEGATIVE

## 2015-03-24 ENCOUNTER — Telehealth: Payer: Self-pay | Admitting: Family Medicine

## 2015-03-24 NOTE — Telephone Encounter (Signed)
Caller name: Tamberlyn Midgley  Relationship to patient: Self  Can be reached: [phone] Pharmacy:  Reason for call: [place] . She says that if feels like she has a UTI, she would like to know if you could call in a script for her at the [facility] in [place].

## 2015-03-24 NOTE — Telephone Encounter (Signed)
Notified pt she will need to see someone / urgent care where she is to determine if Rx will be needed. Has been experiencing "bladder leakage and discomfort". Pt purchased otc UTI test and reports test was negative for nitrates but was positive for leukocytes. Advise pt she needs formal testing at medical office. Pt voices understanding. Will take otc uristat to help with symptom relief.

## 2015-04-16 ENCOUNTER — Other Ambulatory Visit: Payer: Self-pay | Admitting: Family Medicine

## 2015-04-29 ENCOUNTER — Other Ambulatory Visit: Payer: Self-pay | Admitting: Family Medicine

## 2015-05-25 ENCOUNTER — Other Ambulatory Visit: Payer: Self-pay | Admitting: Family Medicine

## 2015-05-25 MED ORDER — CETIRIZINE HCL 10 MG PO TABS: 10.0000 mg | ORAL_TABLET | Freq: Every day | ORAL | Status: DC

## 2015-05-26 ENCOUNTER — Other Ambulatory Visit: Payer: Self-pay | Admitting: Family Medicine

## 2015-05-29 ENCOUNTER — Other Ambulatory Visit: Payer: Self-pay | Admitting: Family Medicine

## 2015-06-13 ENCOUNTER — Other Ambulatory Visit: Payer: Self-pay | Admitting: Family Medicine

## 2015-06-28 ENCOUNTER — Other Ambulatory Visit: Payer: Self-pay | Admitting: Family Medicine

## 2015-06-28 MED ORDER — ROPINIROLE HCL 0.5 MG PO TABS: 0.5000 mg | ORAL_TABLET | Freq: Every evening | ORAL | Status: DC | PRN

## 2015-06-28 MED ORDER — SYNTHROID 100 MCG PO TABS: ORAL_TABLET | ORAL | Status: DC

## 2015-06-28 MED ORDER — METOPROLOL TARTRATE 25 MG PO TABS: 25.0000 mg | ORAL_TABLET | Freq: Two times a day (BID) | ORAL | Status: DC

## 2015-07-05 ENCOUNTER — Telehealth: Payer: Self-pay | Admitting: Behavioral Health

## 2015-07-05 NOTE — Telephone Encounter (Signed)
Unable to reach patient at time of Pre-Visit Call.  Left message for patient to return call when available.    

## 2015-07-06 ENCOUNTER — Encounter: Payer: Self-pay | Admitting: Family Medicine

## 2015-07-06 ENCOUNTER — Ambulatory Visit (INDEPENDENT_AMBULATORY_CARE_PROVIDER_SITE_OTHER): Payer: Medicare Other | Admitting: Family Medicine

## 2015-07-06 VITALS — BP 122/78 | HR 81 | Temp 98.0°F | Ht 63.0 in | Wt 261.1 lb

## 2015-07-06 DIAGNOSIS — E669 Obesity, unspecified: Secondary | ICD-10-CM | POA: Diagnosis not present

## 2015-07-06 DIAGNOSIS — Z0000 Encounter for general adult medical examination without abnormal findings: Secondary | ICD-10-CM

## 2015-07-06 DIAGNOSIS — E038 Other specified hypothyroidism: Secondary | ICD-10-CM

## 2015-07-06 DIAGNOSIS — J45909 Unspecified asthma, uncomplicated: Secondary | ICD-10-CM | POA: Diagnosis not present

## 2015-07-06 DIAGNOSIS — I10 Essential (primary) hypertension: Secondary | ICD-10-CM | POA: Diagnosis not present

## 2015-07-06 DIAGNOSIS — E785 Hyperlipidemia, unspecified: Secondary | ICD-10-CM

## 2015-07-06 DIAGNOSIS — Z1159 Encounter for screening for other viral diseases: Secondary | ICD-10-CM | POA: Diagnosis not present

## 2015-07-06 DIAGNOSIS — Z780 Asymptomatic menopausal state: Secondary | ICD-10-CM

## 2015-07-06 DIAGNOSIS — R609 Edema, unspecified: Secondary | ICD-10-CM

## 2015-07-06 DIAGNOSIS — E538 Deficiency of other specified B group vitamins: Secondary | ICD-10-CM

## 2015-07-06 DIAGNOSIS — M069 Rheumatoid arthritis, unspecified: Secondary | ICD-10-CM

## 2015-07-06 DIAGNOSIS — K219 Gastro-esophageal reflux disease without esophagitis: Secondary | ICD-10-CM

## 2015-07-06 MED ORDER — LEVOTHYROXINE SODIUM 100 MCG PO TABS: 100.0000 ug | ORAL_TABLET | Freq: Every day | ORAL | Status: DC

## 2015-07-06 NOTE — Assessment & Plan Note (Signed)
Encouraged DASH diet, decrease po intake and increase exercise as tolerated. Needs 7-8 hours of sleep nightly. Avoid trans fats, eat small, frequent meals every 4-5 hours with lean proteins, complex carbs and healthy fats. Minimize simple carbs. Is working with [facility] weight lost program. She is working with the [employer] program to try and loose 40 pounds before she proceeds with right total knee replacement

## 2015-07-06 NOTE — Patient Instructions (Signed)
Preventive Care for Adults, Female A healthy lifestyle and preventive care can promote health and wellness. Preventive health guidelines for women include the following key practices.  A routine yearly physical is a good way to check with your health care provider about your health and preventive screening. It is a chance to share any concerns and updates on your health and to receive a thorough exam.  Visit your dentist for a routine exam and preventive care every 6 months. Brush your teeth twice a day and floss once a day. Good oral hygiene prevents tooth decay and gum disease.  The frequency of eye exams is based on your age, health, family medical history, use of contact lenses, and other factors. Follow your health care provider's recommendations for frequency of eye exams.  Eat a healthy diet. Foods like vegetables, fruits, whole grains, low-fat dairy products, and lean protein foods contain the nutrients you need without too many calories. Decrease your intake of foods high in solid fats, added sugars, and salt. Eat the right amount of calories for you.Get information about a proper diet from your health care provider, if necessary.  Regular physical exercise is one of the most important things you can do for your health. Most adults should get at least 150 minutes of moderate-intensity exercise (any activity that increases your heart rate and causes you to sweat) each week. In addition, most adults need muscle-strengthening exercises on 2 or more days a week.  Maintain a healthy weight. The body mass index (BMI) is a screening tool to identify possible weight problems. It provides an estimate of body fat based on height and weight. Your health care provider can find your BMI and can help you achieve or maintain a healthy weight.For adults 20 years and older:  A BMI below 18.5 is considered underweight.  A BMI of 18.5 to 24.9 is normal.  A BMI of 25 to 29.9 is considered overweight.  A  BMI of 30 and above is considered obese.  Maintain normal blood lipids and cholesterol levels by exercising and minimizing your intake of saturated fat. Eat a balanced diet with plenty of fruit and vegetables. Blood tests for lipids and cholesterol should begin at age 45 and be repeated every 5 years. If your lipid or cholesterol levels are high, you are over 50, or you are at high risk for heart disease, you may need your cholesterol levels checked more frequently.Ongoing high lipid and cholesterol levels should be treated with medicines if diet and exercise are not working.  If you smoke, find out from your health care provider how to quit. If you do not use tobacco, do not start.  Lung cancer screening is recommended for adults aged 45-80 years who are at high risk for developing lung cancer because of a history of smoking. A yearly low-dose CT scan of the lungs is recommended for people who have at least a 30-pack-year history of smoking and are a current smoker or have quit within the past 15 years. A pack year of smoking is smoking an average of 1 pack of cigarettes a day for 1 year (for example: 1 pack a day for 30 years or 2 packs a day for 15 years). Yearly screening should continue until the smoker has stopped smoking for at least 15 years. Yearly screening should be stopped for people who develop a health problem that would prevent them from having lung cancer treatment.  If you are pregnant, do not drink alcohol. If you are  breastfeeding, be very cautious about drinking alcohol. If you are not pregnant and choose to drink alcohol, do not have more than 1 drink per day. One drink is considered to be 12 ounces (355 mL) of beer, 5 ounces (148 mL) of wine, or 1.5 ounces (44 mL) of liquor.  Avoid use of street drugs. Do not share needles with anyone. Ask for help if you need support or instructions about stopping the use of drugs.  High blood pressure causes heart disease and increases the risk  of stroke. Your blood pressure should be checked at least every 1 to 2 years. Ongoing high blood pressure should be treated with medicines if weight loss and exercise do not work.  If you are 55-79 years old, ask your health care provider if you should take aspirin to prevent strokes.  Diabetes screening is done by taking a blood sample to check your blood glucose level after you have not eaten for a certain period of time (fasting). If you are not overweight and you do not have risk factors for diabetes, you should be screened once every 3 years starting at age 45. If you are overweight or obese and you are 40-70 years of age, you should be screened for diabetes every year as part of your cardiovascular risk assessment.  Breast cancer screening is essential preventive care for women. You should practice "breast self-awareness." This means understanding the normal appearance and feel of your breasts and may include breast self-examination. Any changes detected, no matter how small, should be reported to a health care provider. Women in their 20s and 30s should have a clinical breast exam (CBE) by a health care provider as part of a regular health exam every 1 to 3 years. After age 40, women should have a CBE every year. Starting at age 40, women should consider having a mammogram (breast X-ray test) every year. Women who have a family history of breast cancer should talk to their health care provider about genetic screening. Women at a high risk of breast cancer should talk to their health care providers about having an MRI and a mammogram every year.  Breast cancer gene (BRCA)-related cancer risk assessment is recommended for women who have family members with BRCA-related cancers. BRCA-related cancers include breast, ovarian, tubal, and peritoneal cancers. Having family members with these cancers may be associated with an increased risk for harmful changes (mutations) in the breast cancer genes BRCA1 and  BRCA2. Results of the assessment will determine the need for genetic counseling and BRCA1 and BRCA2 testing.  Your health care provider may recommend that you be screened regularly for cancer of the pelvic organs (ovaries, uterus, and vagina). This screening involves a pelvic examination, including checking for microscopic changes to the surface of your cervix (Pap test). You may be encouraged to have this screening done every 3 years, beginning at age 21.  For women ages 30-65, health care providers may recommend pelvic exams and Pap testing every 3 years, or they may recommend the Pap and pelvic exam, combined with testing for human papilloma virus (HPV), every 5 years. Some types of HPV increase your risk of cervical cancer. Testing for HPV may also be done on women of any age with unclear Pap test results.  Other health care providers may not recommend any screening for nonpregnant women who are considered low risk for pelvic cancer and who do not have symptoms. Ask your health care provider if a screening pelvic exam is right for   you.  If you have had past treatment for cervical cancer or a condition that could lead to cancer, you need Pap tests and screening for cancer for at least 20 years after your treatment. If Pap tests have been discontinued, your risk factors (such as having a new sexual partner) need to be reassessed to determine if screening should resume. Some women have medical problems that increase the chance of getting cervical cancer. In these cases, your health care provider may recommend more frequent screening and Pap tests.  Colorectal cancer can be detected and often prevented. Most routine colorectal cancer screening begins at the age of 50 years and continues through age 75 years. However, your health care provider may recommend screening at an earlier age if you have risk factors for colon cancer. On a yearly basis, your health care provider may provide home test kits to check  for hidden blood in the stool. Use of a small camera at the end of a tube, to directly examine the colon (sigmoidoscopy or colonoscopy), can detect the earliest forms of colorectal cancer. Talk to your health care provider about this at age 50, when routine screening begins. Direct exam of the colon should be repeated every 5-10 years through age 75 years, unless early forms of precancerous polyps or small growths are found.  People who are at an increased risk for hepatitis B should be screened for this virus. You are considered at high risk for hepatitis B if:  You were born in a country where hepatitis B occurs often. Talk with your health care provider about which countries are considered high risk.  Your parents were born in a high-risk country and you have not received a shot to protect against hepatitis B (hepatitis B vaccine).  You have HIV or AIDS.  You use needles to inject street drugs.  You live with, or have sex with, someone who has hepatitis B.  You get hemodialysis treatment.  You take certain medicines for conditions like cancer, organ transplantation, and autoimmune conditions.  Hepatitis C blood testing is recommended for all people born from 1945 through 1965 and any individual with known risks for hepatitis C.  Practice safe sex. Use condoms and avoid high-risk sexual practices to reduce the spread of sexually transmitted infections (STIs). STIs include gonorrhea, chlamydia, syphilis, trichomonas, herpes, HPV, and human immunodeficiency virus (HIV). Herpes, HIV, and HPV are viral illnesses that have no cure. They can result in disability, cancer, and death.  You should be screened for sexually transmitted illnesses (STIs) including gonorrhea and chlamydia if:  You are sexually active and are younger than 24 years.  You are older than 24 years and your health care provider tells you that you are at risk for this type of infection.  Your sexual activity has changed  since you were last screened and you are at an increased risk for chlamydia or gonorrhea. Ask your health care provider if you are at risk.  If you are at risk of being infected with HIV, it is recommended that you take a prescription medicine daily to prevent HIV infection. This is called preexposure prophylaxis (PrEP). You are considered at risk if:  You are sexually active and do not regularly use condoms or know the HIV status of your partner(s).  You take drugs by injection.  You are sexually active with a partner who has HIV.  Talk with your health care provider about whether you are at high risk of being infected with HIV. If   you choose to begin PrEP, you should first be tested for HIV. You should then be tested every 3 months for as long as you are taking PrEP.  Osteoporosis is a disease in which the bones lose minerals and strength with aging. This can result in serious bone fractures or breaks. The risk of osteoporosis can be identified using a bone density scan. Women ages 67 years and over and women at risk for fractures or osteoporosis should discuss screening with their health care providers. Ask your health care provider whether you should take a calcium supplement or vitamin D to reduce the rate of osteoporosis.  Menopause can be associated with physical symptoms and risks. Hormone replacement therapy is available to decrease symptoms and risks. You should talk to your health care provider about whether hormone replacement therapy is right for you.  Use sunscreen. Apply sunscreen liberally and repeatedly throughout the day. You should seek shade when your shadow is shorter than you. Protect yourself by wearing long sleeves, pants, a wide-brimmed hat, and sunglasses year round, whenever you are outdoors.  Once a month, do a whole body skin exam, using a mirror to look at the skin on your back. Tell your health care provider of new moles, moles that have irregular borders, moles that  are larger than a pencil eraser, or moles that have changed in shape or color.  Stay current with required vaccines (immunizations).  Influenza vaccine. All adults should be immunized every year.  Tetanus, diphtheria, and acellular pertussis (Td, Tdap) vaccine. Pregnant women should receive 1 dose of Tdap vaccine during each pregnancy. The dose should be obtained regardless of the length of time since the last dose. Immunization is preferred during the 27th-36th week of gestation. An adult who has not previously received Tdap or who does not know her vaccine status should receive 1 dose of Tdap. This initial dose should be followed by tetanus and diphtheria toxoids (Td) booster doses every 10 years. Adults with an unknown or incomplete history of completing a 3-dose immunization series with Td-containing vaccines should begin or complete a primary immunization series including a Tdap dose. Adults should receive a Td booster every 10 years.  Varicella vaccine. An adult without evidence of immunity to varicella should receive 2 doses or a second dose if she has previously received 1 dose. Pregnant females who do not have evidence of immunity should receive the first dose after pregnancy. This first dose should be obtained before leaving the health care facility. The second dose should be obtained 4-8 weeks after the first dose.  Human papillomavirus (HPV) vaccine. Females aged 13-26 years who have not received the vaccine previously should obtain the 3-dose series. The vaccine is not recommended for use in pregnant females. However, pregnancy testing is not needed before receiving a dose. If a female is found to be pregnant after receiving a dose, no treatment is needed. In that case, the remaining doses should be delayed until after the pregnancy. Immunization is recommended for any person with an immunocompromised condition through the age of 61 years if she did not get any or all doses earlier. During the  3-dose series, the second dose should be obtained 4-8 weeks after the first dose. The third dose should be obtained 24 weeks after the first dose and 16 weeks after the second dose.  Zoster vaccine. One dose is recommended for adults aged 30 years or older unless certain conditions are present.  Measles, mumps, and rubella (MMR) vaccine. Adults born  before 1957 generally are considered immune to measles and mumps. Adults born in 1957 or later should have 1 or more doses of MMR vaccine unless there is a contraindication to the vaccine or there is laboratory evidence of immunity to each of the three diseases. A routine second dose of MMR vaccine should be obtained at least 28 days after the first dose for students attending postsecondary schools, health care workers, or international travelers. People who received inactivated measles vaccine or an unknown type of measles vaccine during 1963-1967 should receive 2 doses of MMR vaccine. People who received inactivated mumps vaccine or an unknown type of mumps vaccine before 1979 and are at high risk for mumps infection should consider immunization with 2 doses of MMR vaccine. For females of childbearing age, rubella immunity should be determined. If there is no evidence of immunity, females who are not pregnant should be vaccinated. If there is no evidence of immunity, females who are pregnant should delay immunization until after pregnancy. Unvaccinated health care workers born before 1957 who lack laboratory evidence of measles, mumps, or rubella immunity or laboratory confirmation of disease should consider measles and mumps immunization with 2 doses of MMR vaccine or rubella immunization with 1 dose of MMR vaccine.  Pneumococcal 13-valent conjugate (PCV13) vaccine. When indicated, a person who is uncertain of his immunization history and has no record of immunization should receive the PCV13 vaccine. All adults 65 years of age and older should receive this  vaccine. An adult aged 19 years or older who has certain medical conditions and has not been previously immunized should receive 1 dose of PCV13 vaccine. This PCV13 should be followed with a dose of pneumococcal polysaccharide (PPSV23) vaccine. Adults who are at high risk for pneumococcal disease should obtain the PPSV23 vaccine at least 8 weeks after the dose of PCV13 vaccine. Adults older than 65 years of age who have normal immune system function should obtain the PPSV23 vaccine dose at least 1 year after the dose of PCV13 vaccine.  Pneumococcal polysaccharide (PPSV23) vaccine. When PCV13 is also indicated, PCV13 should be obtained first. All adults aged 65 years and older should be immunized. An adult younger than age 65 years who has certain medical conditions should be immunized. Any person who resides in a nursing home or long-term care facility should be immunized. An adult smoker should be immunized. People with an immunocompromised condition and certain other conditions should receive both PCV13 and PPSV23 vaccines. People with human immunodeficiency virus (HIV) infection should be immunized as soon as possible after diagnosis. Immunization during chemotherapy or radiation therapy should be avoided. Routine use of PPSV23 vaccine is not recommended for American Indians, [place] Natives, or people younger than 65 years unless there are medical conditions that require PPSV23 vaccine. When indicated, people who have unknown immunization and have no record of immunization should receive PPSV23 vaccine. One-time revaccination 5 years after the first dose of PPSV23 is recommended for people aged 19-64 years who have chronic kidney failure, nephrotic syndrome, asplenia, or immunocompromised conditions. People who received 1-2 doses of PPSV23 before age 65 years should receive another dose of PPSV23 vaccine at age 65 years or later if at least 5 years have passed since the previous dose. Doses of PPSV23 are not  needed for people immunized with PPSV23 at or after age 65 years.  Meningococcal vaccine. Adults with asplenia or persistent complement component deficiencies should receive 2 doses of quadrivalent meningococcal conjugate (MenACWY-D) vaccine. The doses should be obtained   at least 2 months apart. Microbiologists working with certain meningococcal bacteria, [place] recruits, people at risk during an outbreak, and people who travel to or live in countries with a high rate of meningitis should be immunized. A first-year college student up through age 34 years who is living in a residence hall should receive a dose if she did not receive a dose on or after her 16th birthday. Adults who have certain high-risk conditions should receive one or more doses of vaccine.  Hepatitis A vaccine. Adults who wish to be protected from this disease, have certain high-risk conditions, work with hepatitis A-infected animals, work in hepatitis A research labs, or travel to or work in countries with a high rate of hepatitis A should be immunized. Adults who were previously unvaccinated and who anticipate close contact with an international adoptee during the first 60 days after arrival in the [place] from a country with a high rate of hepatitis A should be immunized.  Hepatitis B vaccine. Adults who wish to be protected from this disease, have certain high-risk conditions, may be exposed to blood or other infectious body fluids, are household contacts or sex partners of hepatitis B positive people, are clients or workers in certain care facilities, or travel to or work in countries with a high rate of hepatitis B should be immunized.  Haemophilus influenzae type b (Hib) vaccine. A previously unvaccinated person with asplenia or sickle cell disease or having a scheduled splenectomy should receive 1 dose of Hib vaccine. Regardless of previous immunization, a recipient of a hematopoietic stem cell transplant should receive a  3-dose series 6-12 months after her successful transplant. Hib vaccine is not recommended for adults with HIV infection. Preventive Services / Frequency Ages 35 to 4 years  Blood pressure check.** / Every 3-5 years.  Lipid and cholesterol check.** / Every 5 years beginning at age 60.  Clinical breast exam.** / Every 3 years for women in their 71s and 10s.  BRCA-related cancer risk assessment.** / For women who have family members with a BRCA-related cancer (breast, ovarian, tubal, or peritoneal cancers).  Pap test.** / Every 2 years from ages 76 through 26. Every 3 years starting at age 61 through age 76 or 93 with a history of 3 consecutive normal Pap tests.  HPV screening.** / Every 3 years from ages 37 through ages 60 to 51 with a history of 3 consecutive normal Pap tests.  Hepatitis C blood test.** / For any individual with known risks for hepatitis C.  Skin self-exam. / Monthly.  Influenza vaccine. / Every year.  Tetanus, diphtheria, and acellular pertussis (Tdap, Td) vaccine.** / Consult your health care provider. Pregnant women should receive 1 dose of Tdap vaccine during each pregnancy. 1 dose of Td every 10 years.  Varicella vaccine.** / Consult your health care provider. Pregnant females who do not have evidence of immunity should receive the first dose after pregnancy.  HPV vaccine. / 3 doses over 6 months, if 93 and younger. The vaccine is not recommended for use in pregnant females. However, pregnancy testing is not needed before receiving a dose.  Measles, mumps, rubella (MMR) vaccine.** / You need at least 1 dose of MMR if you were born in 1957 or later. You may also need a 2nd dose. For females of childbearing age, rubella immunity should be determined. If there is no evidence of immunity, females who are not pregnant should be vaccinated. If there is no evidence of immunity, females who are  pregnant should delay immunization until after pregnancy.  Pneumococcal  13-valent conjugate (PCV13) vaccine.** / Consult your health care provider.  Pneumococcal polysaccharide (PPSV23) vaccine.** / 1 to 2 doses if you smoke cigarettes or if you have certain conditions.  Meningococcal vaccine.** / 1 dose if you are age 68 to 8 years and a Market researcher living in a residence hall, or have one of several medical conditions, you need to get vaccinated against meningococcal disease. You may also need additional booster doses.  Hepatitis A vaccine.** / Consult your health care provider.  Hepatitis B vaccine.** / Consult your health care provider.  Haemophilus influenzae type b (Hib) vaccine.** / Consult your health care provider. Ages 7 to 53 years  Blood pressure check.** / Every year.  Lipid and cholesterol check.** / Every 5 years beginning at age 25 years.  Lung cancer screening. / Every year if you are aged 11-80 years and have a 30-pack-year history of smoking and currently smoke or have quit within the past 15 years. Yearly screening is stopped once you have quit smoking for at least 15 years or develop a health problem that would prevent you from having lung cancer treatment.  Clinical breast exam.** / Every year after age 48 years.  BRCA-related cancer risk assessment.** / For women who have family members with a BRCA-related cancer (breast, ovarian, tubal, or peritoneal cancers).  Mammogram.** / Every year beginning at age 41 years and continuing for as long as you are in good health. Consult with your health care provider.  Pap test.** / Every 3 years starting at age 65 years through age 37 or 70 years with a history of 3 consecutive normal Pap tests.  HPV screening.** / Every 3 years from ages 72 years through ages 60 to 40 years with a history of 3 consecutive normal Pap tests.  Fecal occult blood test (FOBT) of stool. / Every year beginning at age 21 years and continuing until age 5 years. You may not need to do this test if you get  a colonoscopy every 10 years.  Flexible sigmoidoscopy or colonoscopy.** / Every 5 years for a flexible sigmoidoscopy or every 10 years for a colonoscopy beginning at age 35 years and continuing until age 48 years.  Hepatitis C blood test.** / For all people born from [birth year] through 1965 and any individual with known risks for hepatitis C.  Skin self-exam. / Monthly.  Influenza vaccine. / Every year.  Tetanus, diphtheria, and acellular pertussis (Tdap/Td) vaccine.** / Consult your health care provider. Pregnant women should receive 1 dose of Tdap vaccine during each pregnancy. 1 dose of Td every 10 years.  Varicella vaccine.** / Consult your health care provider. Pregnant females who do not have evidence of immunity should receive the first dose after pregnancy.  Zoster vaccine.** / 1 dose for adults aged 30 years or older.  Measles, mumps, rubella (MMR) vaccine.** / You need at least 1 dose of MMR if you were born in 1957 or later. You may also need a second dose. For females of childbearing age, rubella immunity should be determined. If there is no evidence of immunity, females who are not pregnant should be vaccinated. If there is no evidence of immunity, females who are pregnant should delay immunization until after pregnancy.  Pneumococcal 13-valent conjugate (PCV13) vaccine.** / Consult your health care provider.  Pneumococcal polysaccharide (PPSV23) vaccine.** / 1 to 2 doses if you smoke cigarettes or if you have certain conditions.  Meningococcal vaccine.** /  Consult your health care provider.  Hepatitis A vaccine.** / Consult your health care provider.  Hepatitis B vaccine.** / Consult your health care provider.  Haemophilus influenzae type b (Hib) vaccine.** / Consult your health care provider. Ages 64 years and over  Blood pressure check.** / Every year.  Lipid and cholesterol check.** / Every 5 years beginning at age 23 years.  Lung cancer screening. / Every year if you  are aged 16-80 years and have a 30-pack-year history of smoking and currently smoke or have quit within the past 15 years. Yearly screening is stopped once you have quit smoking for at least 15 years or develop a health problem that would prevent you from having lung cancer treatment.  Clinical breast exam.** / Every year after age 74 years.  BRCA-related cancer risk assessment.** / For women who have family members with a BRCA-related cancer (breast, ovarian, tubal, or peritoneal cancers).  Mammogram.** / Every year beginning at age 44 years and continuing for as long as you are in good health. Consult with your health care provider.  Pap test.** / Every 3 years starting at age 58 years through age 22 or 39 years with 3 consecutive normal Pap tests. Testing can be stopped between 65 and 70 years with 3 consecutive normal Pap tests and no abnormal Pap or HPV tests in the past 10 years.  HPV screening.** / Every 3 years from ages 64 years through ages 70 or 61 years with a history of 3 consecutive normal Pap tests. Testing can be stopped between 65 and 70 years with 3 consecutive normal Pap tests and no abnormal Pap or HPV tests in the past 10 years.  Fecal occult blood test (FOBT) of stool. / Every year beginning at age 40 years and continuing until age 27 years. You may not need to do this test if you get a colonoscopy every 10 years.  Flexible sigmoidoscopy or colonoscopy.** / Every 5 years for a flexible sigmoidoscopy or every 10 years for a colonoscopy beginning at age 7 years and continuing until age 32 years.  Hepatitis C blood test.** / For all people born from [birth year] through 1965 and any individual with known risks for hepatitis C.  Osteoporosis screening.** / A one-time screening for women ages 30 years and over and women at risk for fractures or osteoporosis.  Skin self-exam. / Monthly.  Influenza vaccine. / Every year.  Tetanus, diphtheria, and acellular pertussis (Tdap/Td)  vaccine.** / 1 dose of Td every 10 years.  Varicella vaccine.** / Consult your health care provider.  Zoster vaccine.** / 1 dose for adults aged 35 years or older.  Pneumococcal 13-valent conjugate (PCV13) vaccine.** / Consult your health care provider.  Pneumococcal polysaccharide (PPSV23) vaccine.** / 1 dose for all adults aged 46 years and older.  Meningococcal vaccine.** / Consult your health care provider.  Hepatitis A vaccine.** / Consult your health care provider.  Hepatitis B vaccine.** / Consult your health care provider.  Haemophilus influenzae type b (Hib) vaccine.** / Consult your health care provider. ** Family history and personal history of risk and conditions may change your health care provider's recommendations.   This information is not intended to replace advice given to you by your health care provider. Make sure you discuss any questions you have with your health care provider.   Document Released: 09/05/2001 Document Revised: 07/31/2014 Document Reviewed: 12/05/2010 Elsevier Interactive Patient Education [employer].

## 2015-07-11 ENCOUNTER — Encounter: Payer: Self-pay | Admitting: Family Medicine

## 2015-07-11 DIAGNOSIS — Z0000 Encounter for general adult medical examination without abnormal findings: Secondary | ICD-10-CM | POA: Insufficient documentation

## 2015-07-11 NOTE — Assessment & Plan Note (Signed)
Well controlled, no changes to meds. Encouraged heart healthy diet such as the DASH diet and exercise as tolerated.  °

## 2015-07-11 NOTE — Progress Notes (Signed)
Subjective:    [name], female    DOB: Sep 22, 1950, 64 y.o.   MRN: [id number]  Chief Complaint  Patient presents with  . Medicare Wellness    HPI Patient is a 64 yo caucasian female in for annual preventative exam and follow-up on numerous concerns. She has not been seen in quite some time. She has been following with [facility]. She had right knee surgery with Dr. Merleen Nicely. Continues to struggle with knee pain but it is improved. She had lost a great deal of weight with [facility] weight loss program but unfortunately has regained much of it. She has restarted her weight loss efforts with Dr. Talmage Coin with the Prehab weight loss program at [facility]. Other than her weight and her problems with pain and immobility she is doing well. She's had no recent trouble with her breathing or pneumonia. She's not had any gout concerns. No recent illness. She reports a recent hemoglobin A1c at [facility] was 5.1. Vitamin D3 was 42. CBC revealed no anemia with a platelet count of 391 WBC of 8.8, total cholesterol of 220, HDL of 74, LDL of 141, TSH of 1.34. These results are from 04/29/2015 per patient. Doing well at home. No difficulties paying her bills or performing her basic activities of daily living. Denies CP/palp/SOB/HA/congestion/fevers/GI or GU c/o. Taking meds as prescribed  Past Medical History  Diagnosis Date  . Pneumonia     this October from which she has said inhailer  . Obesity   . Chicken pox as a child  . Measles as a child  . Mumps as a child  . Arthritis   . GERD (gastroesophageal reflux disease)   . Hyperlipidemia   . Hypertension   . Hypothyroidism   . Palpitations 08/01/2011  . Anemia 08/01/2011  . Hypokalemia 08/01/2011  . Leukocytosis 08/01/2011  . Low back pain radiating to right leg     intermittent and occasional numbness of skin over right hip in certain positions  . Candidal skin infection 08/11/2011  . UTI (lower urinary tract  infection) 09/17/2011  . Knee pain, right 10/18/2011  . Shoulder pain, bilateral 11/01/2011  . Recurrent epistaxis 02/20/2012  . Preventative health care 12/13/2012  . Essential hypertension 07/17/2011    ACEI d/c 06/2011 for refractory cough> resolved   . RLS (restless legs syndrome) 11/01/2014  . Rheumatoid arthritis ([place]) 09/10/2011    Bone on bone in knees Pain in L>R knees.   . Insomnia 11/01/2014  . Welcome to Medicare preventive visit 07/11/2015  . Annual physical exam 07/11/2015    Follows with Dr Linda Hedges of GYN, Pap 2016 Follows with [facility] and [facility]. [facility] prn Colonoscopy 2007, repat in 2017 Weight loss program [facility], Dr Talmage Coin, Prehab Dr Renda Rolls, derm     Past Surgical History  Procedure Laterality Date  . Thyroidectomy      total for benign tumor, Parathyroid spared  . Tonsillectomy    . Cesarean section      X 3  . Joint replacement  2006    right hip replacement, secondary to congenital  hip defect  . Hernia repair  64 yrs old    right inguinal    Family History  Problem Relation Age of Onset  . Cancer Mother 44    lung/ smoker  . Heart disease Mother   . Hearing loss Mother     tachycardia  .  Mental illness Mother     anxiety, claustrophobia  . Osteoarthritis Father   . Hyperlipidemia Father   . Hyperlipidemia Sister   . Migraines Sister   . Hypertension Sister   . Cancer Maternal Grandmother     colon  . Heart attack Maternal Grandfather   . Aneurysm Paternal Grandfather     abdominal  . Heart disease Paternal Grandfather     AAA rupture, smoker    Social History   Social History  . Marital Status: Married    Spouse Name: N/A  . Number of Children: N/A  . Years of Education: N/A   Occupational History  . Not on file.   Social History Main Topics  . Smoking status: Former Smoker -- 0.50 packs/day for 5 years    Types: Cigarettes    Quit date: 07/25/1971  . Smokeless tobacco: Never Used  .  Alcohol Use: 8.4 oz/week    14 Glasses of wine per week  . Drug Use: No  . 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 Not on file   Social History Narrative    Outpatient Prescriptions Prior to Visit  Medication Sig Dispense Refill  . cetirizine (ZYRTEC) 10 MG tablet Take 1 tablet (10 mg total) by mouth daily. 30 tablet 10  . [facility] D3 1000 UNITS capsule TAKE ONE CAPSULE BY MOUTH EVERY DAY 90 capsule 0  . cyclobenzaprine (FLEXERIL) 5 MG tablet Take 1 tablet (5 mg total) by mouth at bedtime as needed for muscle spasms. (Patient taking differently: Take 5 mg by mouth at bedtime as needed for muscle spasms. PRN) 14 tablet 0  . famotidine (PEPCID) 20 MG tablet TAKE 1 TABLET BY MOUTH AT BEDTIME AS NEEDED FOR HEARTBURN OR INDIGESTION 90 tablet 1  . fluticasone (FLONASE) 50 MCG/ACT nasal spray Place 2 sprays into both nostrils daily. (Patient taking differently: Place 2 sprays into both nostrils daily as needed. ) 16 g 2  . furosemide (LASIX) 20 MG tablet Take 1 tablet (20 mg total) by mouth daily as needed. 1 tablet daily as needed 30 tablet 3  . hydrochlorothiazide (HYDRODIURIL) 25 MG tablet TAKE 1 TABLET BY MOUTH EVERY DAY 30 tablet 0  . meloxicam (MOBIC) 7.5 MG tablet Take 1 tablet (7.5 mg total) by mouth daily. 14 tablet 0  . metoprolol tartrate (LOPRESSOR) 25 MG tablet Take 1 tablet (25 mg total) by mouth 2 (two) times daily. 60 tablet 3  . pantoprazole (PROTONIX) 40 MG tablet TAKE 1 TABLET BY MOUTH EVERY DAY 30 tablet 0  . potassium chloride SA (K-DUR,KLOR-CON) 20 MEQ tablet Take 1 tablet (20 mEq total) by mouth daily as needed (lasix use). 30 tablet 3  . PROAIR HFA 108 (90 BASE) MCG/ACT inhaler INHALE 2 PUFFS INTO THE LUNGS EVERY 6 HOURS AS NEEDED FOR WHEEZING/SHORTNESS OF BREATH 8.5 Inhaler 0  . rOPINIRole (REQUIP) 0.5 MG tablet Take 1-2 tablets (0.5-1 mg total) by mouth at bedtime as needed. 60 tablet 3  . simvastatin  (ZOCOR) 40 MG tablet TAKE 1 TABLET BY MOUTH EVERY EVENING 90 tablet 3  . SYNTHROID 100 MCG tablet TAKE 1 TABLET BY MOUTH EVERY DAY BEFORE BREAKFAST 90 tablet 0  . allopurinol (ZYLOPRIM) 100 MG tablet TAKE 1 TABLET BY MOUTH ONCE DAILY (Patient not taking: Reported on 10/27/2014) 90 tablet 1  . hydrochlorothiazide (HYDRODIURIL) 25 MG tablet TAKE 1 TABLET BY MOUTH EVERY DAY 30 tablet 0  .  pantoprazole (PROTONIX) 40 MG tablet TAKE 1 TABLET BY MOUTH EVERY DAY 30 tablet 0  . traMADol (ULTRAM) 50 MG tablet Take 50 mg by mouth 2 (two) times daily.     No facility-administered medications prior to visit.    Allergies  Allergen Reactions  . Accupril [Quinapril Hcl] Cough    Review of Systems  Constitutional: Positive for malaise/fatigue. Negative for fever and chills.  HENT: Negative for congestion and hearing loss.   Eyes: Negative for discharge.  Respiratory: Negative for cough, sputum production and shortness of breath.   Cardiovascular: Negative for chest pain, palpitations and leg swelling.  Gastrointestinal: Negative for heartburn, nausea, vomiting, abdominal pain, diarrhea, constipation and blood in stool.  Genitourinary: Negative for dysuria, urgency, frequency and hematuria.  Musculoskeletal: Negative for myalgias, back pain and falls.  Skin: Negative for rash.  Neurological: Negative for dizziness, sensory change, loss of consciousness, weakness and headaches.  Endo/Heme/Allergies: Negative for environmental allergies. Does not bruise/bleed easily.  Psychiatric/Behavioral: Negative for depression and suicidal ideas. The patient is not nervous/anxious and does not have insomnia.        Objective:    Physical Exam  Constitutional: She is oriented to person, place, and time. She appears well-developed and well-nourished. No distress.  HENT:  Head: Normocephalic and atraumatic.  Eyes: Conjunctivae are normal.  Neck: Neck supple. No thyromegaly present.  Cardiovascular: Normal rate,  regular rhythm and normal heart sounds.   No murmur heard. Pulmonary/Chest: Effort normal and breath sounds normal. No respiratory distress.  Abdominal: Soft. Bowel sounds are normal. She exhibits no distension and no mass. There is no tenderness.  Musculoskeletal: She exhibits no edema.  Lymphadenopathy:    She has no cervical adenopathy.  Neurological: She is alert and oriented to person, place, and time.  Skin: Skin is warm and dry.  Psychiatric: She has a normal mood and affect. Her behavior is normal.    BP 122/78 mmHg  Pulse 81  Temp(Src) 98 F (36.7 C) (Oral)  Ht 5\' 3"  (1.6 m)  Wt 261 lb 2 oz (118.446 kg)  BMI 46.27 kg/m2  SpO2 97% Wt Readings from Last 3 Encounters:  07/06/15 261 lb 2 oz (118.446 kg)  10/27/14 248 lb 4 oz (112.605 kg)  01/12/14 246 lb (111.585 kg)     Lab Results  Component Value Date   WBC 12.8* 12/02/2012   HGB 13.8 12/02/2012   HCT 40.8 12/02/2012   PLT 380.0 12/02/2012   GLUCOSE 107* 12/02/2012   CHOL 186 12/02/2012   TRIG 169.0* 12/02/2012   HDL 49.80 12/02/2012   LDLCALC 102* 12/02/2012   ALT 21 12/02/2012   AST 21 12/02/2012   NA 138 12/02/2012   K 4.6 12/02/2012   CL 100 12/02/2012   CREATININE 0.9 12/02/2012   BUN 16 12/02/2012   CO2 30 12/02/2012   TSH 3.09 12/02/2012    Lab Results  Component Value Date   TSH 3.09 12/02/2012   Lab Results  Component Value Date   WBC 12.8* 12/02/2012   HGB 13.8 12/02/2012   HCT 40.8 12/02/2012   MCV 90.1 12/02/2012   PLT 380.0 12/02/2012   Lab Results  Component Value Date   NA 138 12/02/2012   K 4.6 12/02/2012   CO2 30 12/02/2012   GLUCOSE 107* 12/02/2012   BUN 16 12/02/2012   CREATININE 0.9 12/02/2012   BILITOT 0.6 12/02/2012   ALKPHOS 93 12/02/2012   AST 21 12/02/2012   ALT 21 12/02/2012   PROT 6.3  12/02/2012   ALBUMIN 3.4* 12/02/2012   ALBUMIN 3.4* 12/02/2012   CALCIUM 9.2 12/02/2012   GFR 67.34 12/02/2012   Lab Results  Component Value Date   CHOL 186 12/02/2012     Lab Results  Component Value Date   HDL 49.80 12/02/2012   Lab Results  Component Value Date   LDLCALC 102* 12/02/2012   Lab Results  Component Value Date   TRIG 169.0* 12/02/2012   Lab Results  Component Value Date   CHOLHDL 4 12/02/2012   No results found for: HGBA1C     Assessment & Plan:   Problem List Items Addressed This Visit    Annual physical exam - Primary    Patient denies any difficulties at home. No trouble with ADLs, depression or falls. See EMR for functional status screen and depression screen. No recent changes to vision or hearing. Is UTD with immunizations. Is UTD with screening. Discussed Advanced Directives. Encouraged heart healthy diet, exercise as tolerated and adequate sleep. See patient's problem list for health risk factors to monitor. See AVS for preventative healthcare recommendation schedule. Follows with Dr Linda Hedges of GYN, Pap 2016 Follows with [facility] and [facility]. Dr [facility] prn Colonoscopy 2007, repat in 2017 [facility] loss program [facility], Dr Talmage Coin, Prehab Dr Renda Rolls, derm Dexa ordered today, labs reviewed      Asthma    She continues to do well, has not had a recurrence of recurrent exacerbations like she had several years ago      Essential hypertension    Well controlled, no changes to meds. Encouraged heart healthy diet such as the DASH diet and exercise as tolerated.       GERD (gastroesophageal reflux disease)    Avoid offending foods, start probiotics. Do not eat large meals in late evening and consider raising head of bed.       Hyperlipidemia    Tolerating statin, encouraged heart healthy diet, avoid trans fats, minimize simple carbs and saturated fats. Increase exercise as tolerated      Hypothyroidism    On Levothyroxine, continue to monitor      Relevant Medications   levothyroxine (SYNTHROID, LEVOTHROID) 100 MCG tablet   Other Relevant Orders   TSH   Obesity (BMI  30-39.9)    Encouraged DASH diet, decrease po intake and increase exercise as tolerated. Needs 7-8 hours of sleep nightly. Avoid trans fats, eat small, frequent meals every 4-5 hours with lean proteins, complex carbs and healthy fats. Minimize simple carbs. Is working with [facility] weight lost program. She is working with the [employer] program to try and loose 40 pounds before she proceeds with right total knee replacement      Peripheral edema    No recent flares, encouraged to minimize sodium, stay active and elevate foot prn      Rheumatoid arthritis ([place])    Follows with rheumatology, Dr Patrecia Pour       Other Visit Diagnoses    Postmenopausal estrogen deficiency        Relevant Orders    DG Bone Density    Need for hepatitis C screening test        Relevant Orders    Hepatitis C Antibody    B12 deficiency        Relevant Orders    B12       I have discontinued Ms. Butrick's traMADol, allopurinol, and SYNTHROID. I have also changed her levothyroxine. Additionally, I am having  her maintain her furosemide, potassium chloride SA, fluticasone, meloxicam, cyclobenzaprine, simvastatin, PROAIR HFA, CVS D3, famotidine, cetirizine, hydrochlorothiazide, pantoprazole, rOPINIRole, metoprolol tartrate, buPROPion, and naltrexone.  Meds ordered this encounter  Medications  . buPROPion (WELLBUTRIN XL) 150 MG 24 hr tablet    Sig: Take 150 mg by mouth daily.  . naltrexone (DEPADE) 50 MG tablet    Sig: Take 25 mg by mouth daily.  Marland Kitchen DISCONTD: levothyroxine (SYNTHROID, LEVOTHROID) 100 MCG tablet    Sig: Take 100 mcg by mouth daily before breakfast.  . levothyroxine (SYNTHROID, LEVOTHROID) 100 MCG tablet    Sig: Take 1 tablet (100 mcg total) by mouth daily before breakfast.    Dispense:  90 tablet    Refill:  1     Brannan Cassedy, MD

## 2015-07-11 NOTE — Assessment & Plan Note (Signed)
Avoid offending foods, start probiotics. Do not eat large meals in late evening and consider raising head of bed.  

## 2015-07-11 NOTE — Assessment & Plan Note (Signed)
Tolerating statin, encouraged heart healthy diet, avoid trans fats, minimize simple carbs and saturated fats. Increase exercise as tolerated 

## 2015-07-11 NOTE — Assessment & Plan Note (Signed)
She continues to do well, has not had a recurrence of recurrent exacerbations like she had several years ago

## 2015-07-11 NOTE — Assessment & Plan Note (Signed)
On Levothyroxine, continue to monitor 

## 2015-07-11 NOTE — Assessment & Plan Note (Signed)
No recent flares, encouraged to minimize sodium, stay active and elevate foot prn

## 2015-07-11 NOTE — Assessment & Plan Note (Addendum)
Patient denies any difficulties at home. No trouble with ADLs, depression or falls. See EMR for functional status screen and depression screen. No recent changes to vision or hearing. Is UTD with immunizations. Is UTD with screening. Discussed Advanced Directives. Encouraged heart healthy diet, exercise as tolerated and adequate sleep. See patient's problem list for health risk factors to monitor. See AVS for preventative healthcare recommendation schedule. Follows with Dr Linda Hedges of GYN, Pap 2016 Follows with [facility] and [facility]. Dr [facility] prn Colonoscopy 2007, repat in 2017 Weight loss program at [facility], Dr Talmage Coin, Prehab Dr Renda Rolls, derm Dexa ordered today, labs reviewed

## 2015-07-11 NOTE — Assessment & Plan Note (Signed)
Follows with rheumatology, Dr Patrecia Pour

## 2015-07-20 ENCOUNTER — Other Ambulatory Visit: Payer: Self-pay | Admitting: Family Medicine

## 2015-07-25 ENCOUNTER — Other Ambulatory Visit: Payer: Self-pay | Admitting: Family Medicine

## 2015-07-27 ENCOUNTER — Emergency Department (HOSPITAL_COMMUNITY): Payer: Medicare Other

## 2015-07-27 ENCOUNTER — Encounter (HOSPITAL_COMMUNITY): Payer: Self-pay | Admitting: *Deleted

## 2015-07-27 ENCOUNTER — Observation Stay (HOSPITAL_COMMUNITY)
Admission: EM | Admit: 2015-07-27 | Discharge: 2015-07-29 | Disposition: A | Discharge status: Home / Self Care | Payer: Medicare Other | Attending: Internal Medicine | Admitting: Internal Medicine

## 2015-07-27 DIAGNOSIS — Z8619 Personal history of other infectious and parasitic diseases: Secondary | ICD-10-CM | POA: Insufficient documentation

## 2015-07-27 DIAGNOSIS — I10 Essential (primary) hypertension: Secondary | ICD-10-CM | POA: Diagnosis present

## 2015-07-27 DIAGNOSIS — Z8701 Personal history of pneumonia (recurrent): Secondary | ICD-10-CM | POA: Insufficient documentation

## 2015-07-27 DIAGNOSIS — Z79899 Other long term (current) drug therapy: Secondary | ICD-10-CM | POA: Insufficient documentation

## 2015-07-27 DIAGNOSIS — E039 Hypothyroidism, unspecified: Secondary | ICD-10-CM | POA: Insufficient documentation

## 2015-07-27 DIAGNOSIS — Z791 Long term (current) use of non-steroidal anti-inflammatories (NSAID): Secondary | ICD-10-CM | POA: Diagnosis not present

## 2015-07-27 DIAGNOSIS — M1990 Unspecified osteoarthritis, unspecified site: Secondary | ICD-10-CM | POA: Insufficient documentation

## 2015-07-27 DIAGNOSIS — E785 Hyperlipidemia, unspecified: Secondary | ICD-10-CM | POA: Insufficient documentation

## 2015-07-27 DIAGNOSIS — M069 Rheumatoid arthritis, unspecified: Secondary | ICD-10-CM | POA: Diagnosis not present

## 2015-07-27 DIAGNOSIS — G4700 Insomnia, unspecified: Secondary | ICD-10-CM | POA: Insufficient documentation

## 2015-07-27 DIAGNOSIS — E669 Obesity, unspecified: Secondary | ICD-10-CM | POA: Diagnosis not present

## 2015-07-27 DIAGNOSIS — Z87891 Personal history of nicotine dependence: Secondary | ICD-10-CM | POA: Insufficient documentation

## 2015-07-27 DIAGNOSIS — G2581 Restless legs syndrome: Secondary | ICD-10-CM | POA: Diagnosis not present

## 2015-07-27 DIAGNOSIS — R079 Chest pain, unspecified: Secondary | ICD-10-CM | POA: Diagnosis present

## 2015-07-27 DIAGNOSIS — K219 Gastro-esophageal reflux disease without esophagitis: Secondary | ICD-10-CM | POA: Diagnosis not present

## 2015-07-27 LAB — CBC
HCT: 41 % (ref 36.0–46.0)
Hemoglobin: 13.2 g/dL (ref 12.0–15.0)
MCH: 28.1 pg (ref 26.0–34.0)
MCHC: 32.2 g/dL (ref 30.0–36.0)
MCV: 87.4 fL (ref 78.0–100.0)
PLATELETS: 384 10*3/uL (ref 150–400)
RBC: 4.69 MIL/uL (ref 3.87–5.11)
RDW: 14.2 % (ref 11.5–15.5)
WBC: 9.6 10*3/uL (ref 4.0–10.5)

## 2015-07-27 LAB — BASIC METABOLIC PANEL
Anion gap: 8 (ref 5–15)
BUN: 22 mg/dL — ABNORMAL HIGH (ref 6–20)
CALCIUM: 9.4 mg/dL (ref 8.9–10.3)
CO2: 29 mmol/L (ref 22–32)
CREATININE: 0.64 mg/dL (ref 0.44–1.00)
Chloride: 104 mmol/L (ref 101–111)
Glucose, Bld: 89 mg/dL (ref 65–99)
Potassium: 3.8 mmol/L (ref 3.5–5.1)
SODIUM: 141 mmol/L (ref 135–145)

## 2015-07-27 LAB — TROPONIN I

## 2015-07-27 NOTE — ED Notes (Signed)
The pt is c/o mid-chest pain since yesterday with nausea

## 2015-07-27 NOTE — Telephone Encounter (Signed)
Pt just received a refill on 07-06-15 for a #90 with 1 refill.

## 2015-07-27 NOTE — Telephone Encounter (Signed)
Pt wants medication to go to [facility] due to this change.  Pharmacy notifyed of this discontinue.

## 2015-07-28 ENCOUNTER — Other Ambulatory Visit (HOSPITAL_COMMUNITY): Payer: Medicare Other

## 2015-07-28 ENCOUNTER — Encounter (HOSPITAL_COMMUNITY): Payer: Self-pay | Admitting: Internal Medicine

## 2015-07-28 ENCOUNTER — Observation Stay (HOSPITAL_COMMUNITY): Payer: Medicare Other

## 2015-07-28 DIAGNOSIS — R072 Precordial pain: Secondary | ICD-10-CM | POA: Diagnosis not present

## 2015-07-28 DIAGNOSIS — R079 Chest pain, unspecified: Secondary | ICD-10-CM | POA: Diagnosis not present

## 2015-07-28 DIAGNOSIS — E039 Hypothyroidism, unspecified: Secondary | ICD-10-CM | POA: Diagnosis not present

## 2015-07-28 DIAGNOSIS — I10 Essential (primary) hypertension: Secondary | ICD-10-CM | POA: Diagnosis not present

## 2015-07-28 DIAGNOSIS — E669 Obesity, unspecified: Secondary | ICD-10-CM | POA: Diagnosis not present

## 2015-07-28 DIAGNOSIS — K219 Gastro-esophageal reflux disease without esophagitis: Secondary | ICD-10-CM | POA: Diagnosis not present

## 2015-07-28 DIAGNOSIS — M1990 Unspecified osteoarthritis, unspecified site: Secondary | ICD-10-CM | POA: Diagnosis not present

## 2015-07-28 LAB — LIPID PANEL
Cholesterol: 214 mg/dL — ABNORMAL HIGH (ref 0–200)
HDL: 64 mg/dL (ref >40)
LDL CALC: 123 mg/dL — AB (ref 0–99)
Total CHOL/HDL Ratio: 3.3 RATIO
Triglycerides: 134 mg/dL (ref <150)
VLDL: 27 mg/dL (ref 0–40)

## 2015-07-28 LAB — CBC
HEMATOCRIT: 40.3 % (ref 36.0–46.0)
HEMOGLOBIN: 13.1 g/dL (ref 12.0–15.0)
MCH: 28.1 pg (ref 26.0–34.0)
MCHC: 32.5 g/dL (ref 30.0–36.0)
MCV: 86.5 fL (ref 78.0–100.0)
Platelets: 336 10*3/uL (ref 150–400)
RBC: 4.66 MIL/uL (ref 3.87–5.11)
RDW: 14.2 % (ref 11.5–15.5)
WBC: 12 10*3/uL — ABNORMAL HIGH (ref 4.0–10.5)

## 2015-07-28 LAB — CREATININE, SERUM
CREATININE: 0.64 mg/dL (ref 0.44–1.00)
GFR calc Af Amer: >60 mL/min (ref >60)
GFR calc non Af Amer: >60 mL/min (ref >60)

## 2015-07-28 LAB — TROPONIN I
Troponin I: <0.03 ng/mL (ref <0.031)
Troponin I: <0.03 ng/mL (ref <0.031)

## 2015-07-28 MED ORDER — SIMVASTATIN 40 MG PO TABS
40.0000 mg | ORAL_TABLET | Freq: Every evening | ORAL | Status: DC
  Administered 2015-07-28: 40 mg via ORAL
  Filled 2015-07-28: qty 1

## 2015-07-28 MED ORDER — NALTREXONE HCL 50 MG PO TABS
25.0000 mg | ORAL_TABLET | Freq: Every day | ORAL | Status: DC
  Filled 2015-07-28 (×2): qty 1

## 2015-07-28 MED ORDER — LEVOTHYROXINE SODIUM 100 MCG PO TABS
100.0000 ug | ORAL_TABLET | Freq: Every day | ORAL | Status: DC
  Administered 2015-07-28 – 2015-07-29 (×2): 100 ug via ORAL
  Filled 2015-07-28 (×2): qty 1

## 2015-07-28 MED ORDER — ASPIRIN 325 MG PO TABS
325.0000 mg | ORAL_TABLET | Freq: Once | ORAL | Status: AC
  Administered 2015-07-28: 325 mg via ORAL
  Filled 2015-07-28: qty 1

## 2015-07-28 MED ORDER — ASPIRIN EC 325 MG PO TBEC
325.0000 mg | DELAYED_RELEASE_TABLET | Freq: Every day | ORAL | Status: DC
  Administered 2015-07-28 – 2015-07-29 (×2): 325 mg via ORAL
  Filled 2015-07-28 (×2): qty 1

## 2015-07-28 MED ORDER — METOPROLOL TARTRATE 25 MG PO TABS
25.0000 mg | ORAL_TABLET | Freq: Two times a day (BID) | ORAL | Status: DC
  Administered 2015-07-28 – 2015-07-29 (×3): 25 mg via ORAL
  Filled 2015-07-28 (×3): qty 1

## 2015-07-28 MED ORDER — BUPROPION HCL ER (XL) 150 MG PO TB24
150.0000 mg | ORAL_TABLET | Freq: Every day | ORAL | Status: DC
  Administered 2015-07-28 – 2015-07-29 (×2): 150 mg via ORAL
  Filled 2015-07-28 (×2): qty 1

## 2015-07-28 MED ORDER — REGADENOSON 0.4 MG/5ML IV SOLN
INTRAVENOUS | Status: AC
  Administered 2015-07-28: 0.4 mg via INTRAVENOUS
  Filled 2015-07-28: qty 5

## 2015-07-28 MED ORDER — MORPHINE SULFATE (PF) 2 MG/ML IV SOLN: 2.0000 mg | INTRAVENOUS | Status: DC | PRN

## 2015-07-28 MED ORDER — NITROGLYCERIN 0.4 MG SL SUBL
0.4000 mg | SUBLINGUAL_TABLET | SUBLINGUAL | Status: AC | PRN
  Administered 2015-07-28 (×3): 0.4 mg via SUBLINGUAL
  Filled 2015-07-28 (×2): qty 1

## 2015-07-28 MED ORDER — ALBUTEROL SULFATE (2.5 MG/3ML) 0.083% IN NEBU: 3.0000 mL | INHALATION_SOLUTION | Freq: Four times a day (QID) | RESPIRATORY_TRACT | Status: DC | PRN

## 2015-07-28 MED ORDER — HYDROCHLOROTHIAZIDE 25 MG PO TABS
25.0000 mg | ORAL_TABLET | Freq: Every day | ORAL | Status: DC
  Administered 2015-07-28 – 2015-07-29 (×2): 25 mg via ORAL
  Filled 2015-07-28 (×2): qty 1

## 2015-07-28 MED ORDER — PANTOPRAZOLE SODIUM 40 MG PO TBEC
40.0000 mg | DELAYED_RELEASE_TABLET | Freq: Every day | ORAL | Status: DC
  Administered 2015-07-28 – 2015-07-29 (×2): 40 mg via ORAL
  Filled 2015-07-28 (×2): qty 1

## 2015-07-28 MED ORDER — ACETAMINOPHEN 325 MG PO TABS
650.0000 mg | ORAL_TABLET | ORAL | Status: DC | PRN
  Administered 2015-07-28: 650 mg via ORAL
  Filled 2015-07-28: qty 2

## 2015-07-28 MED ORDER — FLUTICASONE PROPIONATE 50 MCG/ACT NA SUSP
2.0000 | Freq: Every day | NASAL | Status: DC | PRN
  Filled 2015-07-28: qty 16

## 2015-07-28 MED ORDER — FAMOTIDINE 20 MG PO TABS
20.0000 mg | ORAL_TABLET | Freq: Two times a day (BID) | ORAL | Status: DC
  Administered 2015-07-28 – 2015-07-29 (×3): 20 mg via ORAL
  Filled 2015-07-28 (×3): qty 1

## 2015-07-28 MED ORDER — ENOXAPARIN SODIUM 40 MG/0.4ML ~~LOC~~ SOLN
40.0000 mg | SUBCUTANEOUS | Status: DC
  Administered 2015-07-28: 40 mg via SUBCUTANEOUS
  Filled 2015-07-28 (×2): qty 0.4

## 2015-07-28 MED ORDER — VITAMIN D 1000 UNITS PO TABS
1000.0000 [IU] | ORAL_TABLET | Freq: Every day | ORAL | Status: DC
  Administered 2015-07-28 – 2015-07-29 (×2): 1000 [IU] via ORAL
  Filled 2015-07-28 (×2): qty 1

## 2015-07-28 MED ORDER — LORATADINE 10 MG PO TABS
10.0000 mg | ORAL_TABLET | Freq: Every day | ORAL | Status: DC
  Administered 2015-07-28 – 2015-07-29 (×2): 10 mg via ORAL
  Filled 2015-07-28 (×2): qty 1

## 2015-07-28 MED ORDER — CYCLOBENZAPRINE HCL 10 MG PO TABS: 5.0000 mg | ORAL_TABLET | Freq: Every evening | ORAL | Status: DC | PRN

## 2015-07-28 MED ORDER — REGADENOSON 0.4 MG/5ML IV SOLN
0.4000 mg | Freq: Once | INTRAVENOUS | Status: AC
  Administered 2015-07-28: 0.4 mg via INTRAVENOUS

## 2015-07-28 MED ORDER — ONDANSETRON HCL 4 MG/2ML IJ SOLN: 4.0000 mg | Freq: Four times a day (QID) | INTRAMUSCULAR | Status: DC | PRN

## 2015-07-28 MED ORDER — ZOLPIDEM TARTRATE 5 MG PO TABS
5.0000 mg | ORAL_TABLET | Freq: Once | ORAL | Status: AC
  Administered 2015-07-28: 5 mg via ORAL
  Filled 2015-07-28: qty 1

## 2015-07-28 MED ORDER — ROPINIROLE HCL 0.5 MG PO TABS
0.5000 mg | ORAL_TABLET | Freq: Every evening | ORAL | Status: DC | PRN
  Administered 2015-07-28: 1 mg via ORAL
  Filled 2015-07-28: qty 2

## 2015-07-28 NOTE — Plan of Care (Signed)
Problem: Phase I Progression Outcomes Goal: Aspirin unless contraindicated Outcome: Completed/Met Date Met:  07/28/15 Per documentation patient received in emergency department.

## 2015-07-28 NOTE — Progress Notes (Signed)
   Rebecca Brooks presented for a lexiscan cardiolite today.  No immediate complications.  Stress imaging pending.  This is a TWO DAY STUDY and thus rest imaging will take place on 07/29/2015.  Murray Hodgkins, NP 07/28/2015, 11:27 AM

## 2015-07-28 NOTE — ED Notes (Signed)
Admitting at bedside 

## 2015-07-28 NOTE — Progress Notes (Signed)
Patient Demographics  Rebecca Brooks, is a 65 y.o. female, DOB - 10/31/1950, YS:[id number]  Admit date - 07/27/2015   Admitting Physician Rise Patience, MD  Outpatient Primary MD for the patient is Penni Homans, MD  LOS -    Chief Complaint  Patient presents with  . Chest Pain       Admission HPI/Brief narrative: 65 y.o. female with history of hypertension hyperlipidemia and hypothyroidism presents to the ER because of chest pain, seen by cardiology, chest pain is concerning for cardiac etiology,plan is for 2 day exercise myoview Subjective:   Rebecca Brooks today has, No headache,  No abdominal pain - No Nausea, No new weakness tingling or numbness, No Cough - SOB, reported chest pain, which resolved with nitroglycerin.  Assessment & Plan    Principal Problem:   Chest pain Active Problems:   Hypothyroidism   Hyperlipidemia   Essential hypertension   Pain in the chest  Chest pain - concerning for cardiac etiologyas  pain is responsive to sublingual nitroglycerin. EKG nonacute,  Troponin negative x 2  - Cardiology consult appreciated, plan is for 2 day exercise myoview - Continue with aspirin, statin, beta blockers  Hypertension - Continue with hydrochlorothiazide and metoprolol  Hyperlipidemia - Continue with statin  Code Status: Full  Family Communication:   Disposition Plan:    Procedures  None    Consults   Cardiology   Medications  Scheduled Meds: . aspirin EC  325 mg Oral Daily  . buPROPion  150 mg Oral Daily  . cholecalciferol  1,000 Units Oral Daily  . enoxaparin (LOVENOX) injection  40 mg Subcutaneous Q24H  . famotidine  20 mg Oral BID  . hydrochlorothiazide  25 mg Oral Daily  . levothyroxine  100 mcg Oral QAC breakfast  . loratadine  10 mg Oral Daily  . metoprolol tartrate  25 mg Oral BID  . naltrexone  25 mg Oral Daily  . pantoprazole  40 mg Oral  Daily  . simvastatin  40 mg Oral QPM   Continuous Infusions:  PRN Meds:.acetaminophen, albuterol, cyclobenzaprine, fluticasone, morphine injection, ondansetron (ZOFRAN) IV, rOPINIRole  DVT Prophylaxis  Lovenox   Lab Results  Component Value Date   PLT 336 07/28/2015    Antibiotics    Anti-infectives    None          Objective:   Filed Vitals:   07/28/15 0232 07/28/15 0330 07/28/15 0403 07/28/15 0822  BP: 166/97 136/57 120/64 132/67  Pulse: 83 80  97  Temp: 97.8 F (36.6 C) 97.9 F (36.6 C)  98 F (36.7 C)  TempSrc: Oral Oral  Oral  Resp: 18 18  16   Height: 5\' 3"  (1.6 m)     Weight: 118.616 kg (261 lb 8 oz)     SpO2: 99% 98%  97%    Wt Readings from Last 3 Encounters:  07/28/15 118.616 kg (261 lb 8 oz)  07/06/15 118.446 kg (261 lb 2 oz)  10/27/14 112.605 kg (248 lb 4 oz)     Intake/Output Summary (Last 24 hours) at 07/28/15 0945 Last data filed at 07/28/15 0540  Gross per 24 hour  Intake    120 ml  Output    425 ml  Net   -  305 ml     Physical Exam  Awake Alert, Oriented X 3, No new F.N deficits, Normal affect [place].AT,PERRAL Supple Neck,No JVD, No cervical lymphadenopathy appriciated.  Symmetrical Chest wall movement, Good air movement bilaterally, CTAB RRR,No Gallops,Rubs or new Murmurs, No Parasternal Heave +ve B.Sounds, Abd Soft, No tenderness, No organomegaly appriciated, No rebound - guarding or rigidity. No Cyanosis, Clubbing or edema, No new Rash or bruise     Data Review   Micro Results No results found for this or any previous visit (from the past 240 hour(s)).  Radiology Reports Dg Chest 2 View  07/27/2015  CLINICAL DATA:  Central chest pain. Posterior left shoulder pain. Symptoms since last evening. EXAM: CHEST  2 VIEW COMPARISON:  Radiographs 09/01/2011 FINDINGS: Heart at the upper limits normal in size, unchanged. No pulmonary edema, confluent airspace disease, pleural effusion or pneumothorax. Minimal biapical pleural parenchymal  scarring. Chronic change about the right greater than left shoulder. Degenerative change in the spine. IMPRESSION: No acute pulmonary process. Electronically Signed   By: Jeb Levering M.D.   On: 07/27/2015 20:17     CBC  Recent Labs Lab 07/27/15 1941 07/28/15 0326  WBC 9.6 12.0*  HGB 13.2 13.1  HCT 41.0 40.3  PLT 384 336  MCV 87.4 86.5  MCH 28.1 28.1  MCHC 32.2 32.5  RDW 14.2 14.2    Chemistries   Recent Labs Lab 07/27/15 1941 07/28/15 0326  NA 141  --   K 3.8  --   CL 104  --   CO2 29  --   GLUCOSE 89  --   BUN 22*  --   CREATININE 0.64 0.64  CALCIUM 9.4  --    ------------------------------------------------------------------------------------------------------------------ estimated creatinine clearance is 87.3 mL/min (by C-G formula based on Cr of 0.64). ------------------------------------------------------------------------------------------------------------------ No results for input(s): HGBA1C in the last 72 hours. ------------------------------------------------------------------------------------------------------------------ No results for input(s): CHOL, HDL, LDLCALC, TRIG, CHOLHDL, LDLDIRECT in the last 72 hours. ------------------------------------------------------------------------------------------------------------------ No results for input(s): TSH, T4TOTAL, T3FREE, THYROIDAB in the last 72 hours.  Invalid input(s): FREET3 ------------------------------------------------------------------------------------------------------------------ No results for input(s): VITAMINB12, FOLATE, FERRITIN, TIBC, IRON, RETICCTPCT in the last 72 hours.  Coagulation profile No results for input(s): INR, PROTIME in the last 168 hours.  No results for input(s): DDIMER in the last 72 hours.  Cardiac Enzymes  Recent Labs Lab 07/27/15 1941 07/28/15 0326  TROPONINI <0.03 <0.03    ------------------------------------------------------------------------------------------------------------------ Invalid input(s): POCBNP     Time Spent in minutes   No charge   [facility], DAWOOD M.D on 07/28/2015 at 9:45 AM  Between 7am to 7pm - Pager - [phone]  After 7pm go to [email or web address] - password [facility]  [facility]   Office  [phone]

## 2015-07-28 NOTE — Plan of Care (Signed)
Problem: Pain Managment: Goal: General experience of comfort will improve Outcome: Progressing Patient was complaining of left side, chest pain in the emergency department that was relieved by one sublingual nitroglycerin.

## 2015-07-28 NOTE — Plan of Care (Signed)
Problem: Consults Goal: Chest Pain Patient Education (See Patient Education module for education specifics.)  Outcome: Progressing Patient currently denying chest pain.  RN instructed patient to notify staff immediately if she started experiencing any chest pain.  Patient asked even if the pain was only a 2.  RN explained to patient that if she started feeling any chest pain whatsoever, even a 1/10 to please notify staff immediately.  Patient stated understanding.

## 2015-07-28 NOTE — H&P (Signed)
[facility] History and Physical  Rebecca Brooks [id number] DOB: 01/16/1951 DOA: 07/27/2015  Referring physician: Dr.ONI. PCP: Penni Homans, MD  Specialists: None.  Chief Complaint: Chest pain.  HPI: Rebecca Brooks is a 65 y.o. female with history of hypertension hyperlipidemia and hypothyroidism presents to the ER because of chest pain. Patient started exposing chest pain night before this. Patient's chest pain is retrosternal pressure like at times burning in sensation. Denies any exertional symptoms. Pain is present even at rest. Yesterday morning when patient woke up patient's pain was resolved but started happening again later in the afternoon. In the ER EKG cardiac markers and chest x-ray were unremarkable. Pain improved with sublingual nitroglycerin. Patient has been admitted for further management of chest pain. Patient was recently placed on naltrexone for appetite.   Review of Systems: As presented in the history of presenting illness, rest negative.  Past Medical History  Diagnosis Date  . Pneumonia     this October from which she has said inhailer  . Obesity   . Chicken pox as a child  . Measles as a child  . Mumps as a child  . Arthritis   . GERD (gastroesophageal reflux disease)   . Hyperlipidemia   . Hypertension   . Hypothyroidism   . Palpitations 08/01/2011  . Anemia 08/01/2011  . Hypokalemia 08/01/2011  . Leukocytosis 08/01/2011  . Low back pain radiating to right leg     intermittent and occasional numbness of skin over right hip in certain positions  . Candidal skin infection 08/11/2011  . UTI (lower urinary tract infection) 09/17/2011  . Knee pain, right 10/18/2011  . Shoulder pain, bilateral 11/01/2011  . Recurrent epistaxis 02/20/2012  . Preventative health care 12/13/2012  . Essential hypertension 07/17/2011    ACEI d/c 06/2011 for refractory cough> resolved   . RLS (restless legs syndrome) 11/01/2014  . Rheumatoid arthritis ([place]) 09/10/2011    Bone on  bone in knees Pain in L>R knees.   . Insomnia 11/01/2014  . Welcome to Medicare preventive visit 07/11/2015  . Annual physical exam 07/11/2015    Follows with Dr Linda Hedges of GYN, Pap 2016 Follows with [facility] and [facility]. Dr [facility] prn Colonoscopy 2007, repat in 2017 Weight loss program at [facility], Dr Talmage Coin, Prehab Dr Renda Rolls, derm    Past Surgical History  Procedure Laterality Date  . Thyroidectomy      total for benign tumor, Parathyroid spared  . Tonsillectomy    . Cesarean section      X 3  . Joint replacement  2006    right hip replacement, secondary to congenital  hip defect  . Hernia repair  65 yrs old    right inguinal   Social History:  reports that she quit smoking about 44 years ago. Her smoking use included Cigarettes. She has a 2.5 pack-year smoking history. She has never used smokeless tobacco. She reports that she drinks about 8.4 oz of alcohol per week. She reports that she does not use illicit drugs. Where does patient live home. Can patient participate in ADLs? Yes.  Allergies  Allergen Reactions  . Accupril [Quinapril Hcl] Cough    Family History:  Family History  Problem Relation Age of Onset  . Cancer Mother 38    lung/ smoker  . Heart disease Mother   . Hearing loss Mother     tachycardia  . Mental illness Mother     anxiety, claustrophobia  .  Osteoarthritis Father   . Hyperlipidemia Father   . Hyperlipidemia Sister   . Migraines Sister   . Hypertension Sister   . Cancer Maternal Grandmother     colon  . Heart attack Maternal Grandfather   . Aneurysm Paternal Grandfather     abdominal  . Heart disease Paternal Grandfather     AAA rupture, smoker      Prior to Admission medications   Medication Sig Start Date End Date Taking? Authorizing Provider  buPROPion (WELLBUTRIN XL) 150 MG 24 hr tablet Take 150 mg by mouth daily. 06/24/15  Yes Historical Provider, MD  cetirizine (ZYRTEC) 10 MG tablet Take 1 tablet  (10 mg total) by mouth daily. 05/25/15  Yes Mosie Lukes, MD  [employer] D3 1000 UNITS capsule TAKE ONE CAPSULE BY MOUTH EVERY DAY 07/20/15  Yes Mosie Lukes, MD  cyclobenzaprine (FLEXERIL) 5 MG tablet Take 1 tablet (5 mg total) by mouth at bedtime as needed for muscle spasms. 01/12/14  Yes Debbrah Alar, NP  famotidine (PEPCID) 20 MG tablet TAKE 1 TABLET BY MOUTH AT BEDTIME AS NEEDED FOR HEARTBURN OR INDIGESTION 04/29/15  Yes Mosie Lukes, MD  fluticasone (FLONASE) 50 MCG/ACT nasal spray Place 2 sprays into both nostrils daily. Patient taking differently: Place 2 sprays into both nostrils daily as needed.  11/03/13  Yes Debbrah Alar, NP  furosemide (LASIX) 20 MG tablet Take 1 tablet (20 mg total) by mouth daily as needed. 1 tablet daily as needed 09/03/12  Yes Mosie Lukes, MD  hydrochlorothiazide (HYDRODIURIL) 25 MG tablet TAKE 1 TABLET BY MOUTH EVERY DAY 05/26/15  Yes Mosie Lukes, MD  levothyroxine (SYNTHROID, LEVOTHROID) 100 MCG tablet Take 1 tablet (100 mcg total) by mouth daily before breakfast. 07/06/15  Yes Mosie Lukes, MD  meloxicam (MOBIC) 7.5 MG tablet Take 1 tablet (7.5 mg total) by mouth daily. 01/12/14  Yes Debbrah Alar, NP  metoprolol tartrate (LOPRESSOR) 25 MG tablet Take 1 tablet (25 mg total) by mouth 2 (two) times daily. 06/28/15  Yes Mosie Lukes, MD  naltrexone (DEPADE) 50 MG tablet Take 25 mg by mouth daily. 06/24/15  Yes Historical Provider, MD  pantoprazole (PROTONIX) 40 MG tablet TAKE 1 TABLET BY MOUTH EVERY DAY 05/26/15  Yes Mosie Lukes, MD  potassium chloride SA (K-DUR,KLOR-CON) 20 MEQ tablet Take 1 tablet (20 mEq total) by mouth daily as needed (lasix use). 09/03/12  Yes Mosie Lukes, MD  PROAIR HFA 108 (90 BASE) MCG/ACT inhaler INHALE 2 PUFFS INTO THE LUNGS EVERY 6 HOURS AS NEEDED FOR WHEEZING/SHORTNESS OF BREATH 11/24/14  Yes Mosie Lukes, MD  rOPINIRole (REQUIP) 0.5 MG tablet Take 1-2 tablets (0.5-1 mg total) by mouth at bedtime as needed. 06/28/15   Yes Mosie Lukes, MD  simvastatin (ZOCOR) 40 MG tablet TAKE 1 TABLET BY MOUTH EVERY EVENING 11/17/14  Yes Mosie Lukes, MD    Physical Exam: Filed Vitals:   07/28/15 0104 07/28/15 0115 07/28/15 0130 07/28/15 0145  BP: 126/84 128/84 147/78 139/76  Pulse: 94 88 83 82  Temp:      TempSrc:      Resp: 20 15 19    SpO2: 96% 98% 98% 98%     General:  Moderately built and nourished.  Eyes: Anicteric no pallor.  ENT: No discharge from the ears eyes nose or mouth.  Neck: No mass felt. No JVD appreciated.  Cardiovascular: S1-S2 heard.  Respiratory: No rhonchi or crepitations.  Abdomen: Soft nontender bowel sounds present.  No guarding or rigidity.  Skin: No rash.  Musculoskeletal: No edema.  Psychiatric: Appears normal.  Neurologic: Alert awake oriented to time place and person. Moves all extremities.  Labs on Admission:  Basic Metabolic Panel:  Recent Labs Lab 07/27/15 1941  NA 141  K 3.8  CL 104  CO2 29  GLUCOSE 89  BUN 22*  CREATININE 0.64  CALCIUM 9.4   Liver Function Tests: No results for input(s): AST, ALT, ALKPHOS, BILITOT, PROT, ALBUMIN in the last 168 hours. No results for input(s): LIPASE, AMYLASE in the last 168 hours. No results for input(s): AMMONIA in the last 168 hours. CBC:  Recent Labs Lab 07/27/15 1941  WBC 9.6  HGB 13.2  HCT 41.0  MCV 87.4  PLT 384   Cardiac Enzymes:  Recent Labs Lab 07/27/15 1941  TROPONINI <0.03    BNP (last 3 results) No results for input(s): BNP in the last 8760 hours.  ProBNP (last 3 results) No results for input(s): PROBNP in the last 8760 hours.  CBG: No results for input(s): GLUCAP in the last 168 hours.  Radiological Exams on Admission: Dg Chest 2 View  07/27/2015  CLINICAL DATA:  Central chest pain. Posterior left shoulder pain. Symptoms since last evening. EXAM: CHEST  2 VIEW COMPARISON:  Radiographs 09/01/2011 FINDINGS: Heart at the upper limits normal in size, unchanged. No pulmonary edema,  confluent airspace disease, pleural effusion or pneumothorax. Minimal biapical pleural parenchymal scarring. Chronic change about the right greater than left shoulder. Degenerative change in the spine. IMPRESSION: No acute pulmonary process. Electronically Signed   By: Jeb Levering M.D.   On: 07/27/2015 20:17    EKG: Independently reviewed. Normal sinus rhythm.  Assessment/Plan Principal Problem:   Chest pain Active Problems:   Hypothyroidism   Hyperlipidemia   Essential hypertension   Pain in the chest   1. Chest pain - given the history of hypertension hyperlipidemia and chest pain improving with nitroglycerin we will cycle cardiac markers to rule out ACS. Patient will be kept nothing by mouth past four a.m. in anticipation of cardiac procedure. Check 2-D echo. Aspirin. When necessary sublingual nitroglycerin. 2. Hypertension - continue hydrochlorothiazide and metoprolol. 3. Hyperlipidemia on statins. 4. History of peripheral edema on when necessary Lasix.   DVT Prophylaxis Lovenox.  Code Status: Full code.  Family Communication: Patient's husband.  Disposition Plan: Admit for observation.    KAKRAKANDY,ARSHAD N. [facility] Pager [phone].  If 7PM-7AM, please contact night-coverage [email or web address] Password [facility] 07/28/2015, 2:33 AM

## 2015-07-28 NOTE — Plan of Care (Signed)
Problem: Education: Goal: Knowledge of [facility] Education information/materials will improve Outcome: Progressing RN provided room orientation to patient as well as explaining use of call light and hospital room phone to call staff when assistance is needed.  RN provided information regarding patient's safety.  Plan of care reviewed with patient including current MD orders.  RN provided education pertaining to Troponin labs that have been ordered.

## 2015-07-28 NOTE — Consult Note (Addendum)
CARDIOLOGY CONSULT NOTE   [name] MRN: [id number] DOB/AGE: June 18, 1951 65 y.o.  Admit date: 07/27/2015  Primary Physician   Penni Homans, MD Primary Cardiologist  New Reason for Consultation  Chest pain Requesting Physician Dr. Waldron Labs  HPI: Rebecca Brooks is a 65 y.o. female with a history of HTN, HL, hypothyroidism and restless leg syndrome who came to [facility] ED 07/27/15 for evaluation of chest pain.   No prior cardiac history. The patient woke up from sleep 1:30 am 07/27/15 with substernal/left upper chest pain. She described the pain as a pressure-like burning sensation and achy. The pain lasted on and off for a few hours and resolved with ibuprofen. She went to work where she again had a episode of chest pain and resolved. Around 4:00pm 1/3 patient again had a episode of chest pain with some dizziness and radiated to her  shoulder blade. The pain intermittently lasted for 5-6 hour that lead her to ED presentation where her pain resolved with sublingual nitroglycerin x 1.  Last night on a the floor patient again had an episode of chest pain resolved with the nitroglycerin x 2. She currently chest pain-free. Patient admits to having similar episode during Christmas lasted for a few hours along with palpitations. Patient was recently placed on naltrexone for appetite. The patient denies nausea, vomiting, fever, shortness of breath, orthopnea, PND, dizziness, syncope, cough, congestion, abdominal pain, hematochezia, melena, lower extremity edema. She does not exercise due to severe bilateral knee pain. Remote hx of smoking for 5 years during her college year. No use of illicit drugs. Mother had a history of palpitation/heart racing. She takes Protonix 40 mg daily for GERD.   Troponin x 2 negative. EKG nonischemic. Chest x-ray clear. Lytes normal.  Past Medical History  Diagnosis Date  . Pneumonia     this October from which she has said inhailer  . Obesity   . Chicken pox as  a child  . Measles as a child  . Mumps as a child  . Arthritis   . GERD (gastroesophageal reflux disease)   . Hyperlipidemia   . Hypertension   . Hypothyroidism   . Low back pain radiating to right leg     intermittent and occasional numbness of skin over right hip in certain positions  . Recurrent epistaxis 02/20/2012  . Essential hypertension 07/17/2011    ACEI d/c 06/2011 for refractory cough> resolved   . RLS (restless legs syndrome) 11/01/2014  . Rheumatoid arthritis ([place]) 09/10/2011    Bone on bone in knees Pain in L>R knees.   . Insomnia 11/01/2014     Past Surgical History  Procedure Laterality Date  . Thyroidectomy      total for benign tumor, Parathyroid spared  . Tonsillectomy    . Cesarean section      X 3  . Joint replacement  2006    right hip replacement, secondary to congenital  hip defect  . Hernia repair  65 yrs old    right inguinal    Allergies  Allergen Reactions  . Accupril [Quinapril Hcl] Cough    I have reviewed the patient's current medications . aspirin EC  325 mg Oral Daily  . buPROPion  150 mg Oral Daily  . cholecalciferol  1,000 Units Oral Daily  . enoxaparin (LOVENOX) injection  40 mg Subcutaneous Q24H  . famotidine  20 mg Oral BID  . hydrochlorothiazide  25 mg Oral Daily  . levothyroxine  100 mcg Oral  QAC breakfast  . loratadine  10 mg Oral Daily  . metoprolol tartrate  25 mg Oral BID  . naltrexone  25 mg Oral Daily  . pantoprazole  40 mg Oral Daily  . simvastatin  40 mg Oral QPM     acetaminophen, albuterol, cyclobenzaprine, fluticasone, morphine injection, ondansetron (ZOFRAN) IV, rOPINIRole  Prior to Admission medications   Medication Sig Start Date End Date Taking? Authorizing Provider  buPROPion (WELLBUTRIN XL) 150 MG 24 hr tablet Take 150 mg by mouth daily. 06/24/15  Yes Historical Provider, MD  cetirizine (ZYRTEC) 10 MG tablet Take 1 tablet (10 mg total) by mouth daily. 05/25/15  Yes Mosie Lukes, MD  [employer] D3 1000 UNITS capsule  TAKE ONE CAPSULE BY MOUTH EVERY DAY 07/20/15  Yes Mosie Lukes, MD  cyclobenzaprine (FLEXERIL) 5 MG tablet Take 1 tablet (5 mg total) by mouth at bedtime as needed for muscle spasms. 01/12/14  Yes Debbrah Alar, NP  famotidine (PEPCID) 20 MG tablet TAKE 1 TABLET BY MOUTH AT BEDTIME AS NEEDED FOR HEARTBURN OR INDIGESTION 04/29/15  Yes Mosie Lukes, MD  fluticasone (FLONASE) 50 MCG/ACT nasal spray Place 2 sprays into both nostrils daily. Patient taking differently: Place 2 sprays into both nostrils daily as needed.  11/03/13  Yes Debbrah Alar, NP  furosemide (LASIX) 20 MG tablet Take 1 tablet (20 mg total) by mouth daily as needed. 1 tablet daily as needed 09/03/12  Yes Mosie Lukes, MD  hydrochlorothiazide (HYDRODIURIL) 25 MG tablet TAKE 1 TABLET BY MOUTH EVERY DAY 05/26/15  Yes Mosie Lukes, MD  levothyroxine (SYNTHROID, LEVOTHROID) 100 MCG tablet Take 1 tablet (100 mcg total) by mouth daily before breakfast. 07/06/15  Yes Mosie Lukes, MD  meloxicam (MOBIC) 7.5 MG tablet Take 1 tablet (7.5 mg total) by mouth daily. 01/12/14  Yes Debbrah Alar, NP  metoprolol tartrate (LOPRESSOR) 25 MG tablet Take 1 tablet (25 mg total) by mouth 2 (two) times daily. 06/28/15  Yes Mosie Lukes, MD  naltrexone (DEPADE) 50 MG tablet Take 25 mg by mouth daily. 06/24/15  Yes Historical Provider, MD  pantoprazole (PROTONIX) 40 MG tablet TAKE 1 TABLET BY MOUTH EVERY DAY 05/26/15  Yes Mosie Lukes, MD  potassium chloride SA (K-DUR,KLOR-CON) 20 MEQ tablet Take 1 tablet (20 mEq total) by mouth daily as needed (lasix use). 09/03/12  Yes Mosie Lukes, MD  PROAIR HFA 108 (90 BASE) MCG/ACT inhaler INHALE 2 PUFFS INTO THE LUNGS EVERY 6 HOURS AS NEEDED FOR WHEEZING/SHORTNESS OF BREATH 11/24/14  Yes Mosie Lukes, MD  rOPINIRole (REQUIP) 0.5 MG tablet Take 1-2 tablets (0.5-1 mg total) by mouth at bedtime as needed. 06/28/15  Yes Mosie Lukes, MD  simvastatin (ZOCOR) 40 MG tablet TAKE 1 TABLET BY MOUTH EVERY  EVENING 11/17/14  Yes Mosie Lukes, MD     Social History   Social History  . Marital Status: Married    Spouse Name: N/A  . Number of Children: N/A  . Years of Education: N/A   Occupational History  . Not on file.   Social History Main Topics  . Smoking status: Former Smoker -- 0.50 packs/day for 5 years    Types: Cigarettes    Quit date: 07/25/1971  . Smokeless tobacco: Never Used  . Alcohol Use: 8.4 oz/week    14 Glasses of wine per week  . Drug Use: No  . 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 Not on file   Social History Narrative    Family Status  Relation Status Death Age  . Mother Deceased 84  . Father Deceased 71  . Sister Alive     79  . Maternal Grandmother Deceased 44  . Maternal Grandfather Deceased 47  . Paternal Grandfather Deceased 9  . Son Alive     35/ healthy  . Paternal Grandmother Deceased 102  . Sister Alive     52/ healthy  . Son Alive     32/ healhty  . Son Alive     25/ healthy   Family History  Problem Relation Age of Onset  . Cancer Mother 21    lung/ smoker  . Heart disease Mother   . Hearing loss Mother     tachycardia  . Mental illness Mother     anxiety, claustrophobia  . Osteoarthritis Father   . Hyperlipidemia Father   . Hyperlipidemia Sister   . Migraines Sister   . Hypertension Sister   . Cancer Maternal Grandmother     colon  . Heart attack Maternal Grandfather   . Aneurysm Paternal Grandfather     abdominal  . Heart disease Paternal Grandfather     AAA rupture, smoker       ROS:  Full 14 point review of systems complete and found to be negative unless listed above.  Physical Exam: Blood pressure 132/67, pulse 97, temperature 98 F (36.7 C), temperature source Oral, resp. rate 16, height 5\' 3"  (1.6 m), weight 261 lb 8 oz (118.616 kg), SpO2 97 %.  General: Pleasant morbidly obese female in no acute distress Head: Eyes PERRLA, No  xanthomas. Normocephalic and atraumatic, oropharynx without edema or exudate.  Lungs: Resp regular and unlabored, CTA. Heart: RRR no s3, s4. [id number] Systolic  murmurs..   Neck: No carotid bruits. No lymphadenopathy. No  JVD. Abdomen: Bowel sounds present, abdomen soft and non-tender without masses or hernias noted. Msk:  No spine or cva tenderness. No weakness, no joint deformities or effusions. Extremities: No clubbing, cyanosis or edema. DP/PT/Radials 2+ and equal bilaterally. Neuro: Alert and oriented X 3. No focal deficits noted. Psych:  Good affect, responds appropriately Skin: No rashes or lesions noted.  Labs:   Lab Results  Component Value Date   WBC 12.0* 07/28/2015   HGB 13.1 07/28/2015   HCT 40.3 07/28/2015   MCV 86.5 07/28/2015   PLT 336 07/28/2015   No results for input(s): INR in the last 72 hours.   Recent Labs Lab 07/27/15 1941 07/28/15 0326  NA 141  --   K 3.8  --   CL 104  --   CO2 29  --   BUN 22*  --   CREATININE 0.64 0.64  CALCIUM 9.4  --   GLUCOSE 89  --    MAGNESIUM  Date Value Ref Range Status  09/03/2012 1.8 1.5 - 2.5 mg/dL Final    Recent Labs  07/27/15 1941 07/28/15 0326  TROPONINI <0.03 <0.03    Echo:07/17/2011 LV EF: 60% -  65%  ------------------------------------------------------------ Indications:   Dyspnea 786.09.  ------------------------------------------------------------ History:  PMH: No prior cardiac history. Risk factors: Pneumonia Hypertension. Obese. Dyslipidemia.  ------------------------------------------------------------ Study Conclusions  Left ventricle: The cavity size was normal. Systolic function was normal. The estimated ejection fraction was in the range of 60% to 65%.       ECG:  Vent. rate 81 BPM PR interval 168 ms QRS duration 98 ms QT/QTc  412/478 ms P-R-T axes 70 -34 46  Radiology:  Dg Chest 2 View  07/27/2015  CLINICAL DATA:  Central chest pain. Posterior left shoulder pain.  Symptoms since last evening. EXAM: CHEST  2 VIEW COMPARISON:  Radiographs 09/01/2011 FINDINGS: Heart at the upper limits normal in size, unchanged. No pulmonary edema, confluent airspace disease, pleural effusion or pneumothorax. Minimal biapical pleural parenchymal scarring. Chronic change about the right greater than left shoulder. Degenerative change in the spine. IMPRESSION: No acute pulmonary process. Electronically Signed   By: Jeb Levering M.D.   On: 07/27/2015 20:17    ASSESSMENT AND PLAN:     1. Chest pain - Her chest pain is concerning for cardiac etiology. Lasted on and off yesterday. The pain is responsive to sublingual nitroglycerin. EKG nonischemic. Troponin x 2 negative. Her cardiac factor includes hypertension, hyperlipidemia and obesity. Remote history of smoking about 40 years ago year for 5 years.  She will benefit from Myoview. She is nothing by mouth. ? 2 days study. Will discuss with MD.   2. HTN - Stable. Continue current medications.   3. Systolic murmur - Suspect AS. Pending echo. No syncope  4. HL - No results found for requested labs within last 365 days.  - Continue Zocor 40 mg. Will get lipid panel for risk stratification.  Signed: Minus Breeding, MD 07/28/2015, 9:46 AM Pager [phone]  Co-Sign MD  History and all data above reviewed.  Patient examined.  I agree with the findings as above.  She has had chest pain as described.  No objective evidence of ischemia.  The patient exam reveals COR:RRR  ,  Lungs: Clear  ,  Abd: Positive bowel sounds, no rebound no guarding, Ext No edema  .  All available labs, radiology testing, previous records reviewed. Agree with documented assessment and plan. Chest pain:  Atypical and typical features.  Plan exercise myoview.  Murmur can be followed.  No indication for an echo at as an in patient.  Note prolonged QT.  Repeat EKG in AM.    Minus Breeding  9:46 AM  07/28/2015

## 2015-07-28 NOTE — ED Provider Notes (Signed)
CSN: [id number]     Arrival date & time 07/27/15  1902 History  By signing my name below, I, Evelene Croon, attest that this documentation has been prepared under the direction and in the presence of Everlene Balls, MD . Electronically Signed: Evelene Croon, Scribe. 07/28/2015. 1:09 AM    Chief Complaint  Patient presents with  . Chest Pain     The history is provided by the patient. No language interpreter was used.    HPI Comments:  Rebecca Brooks is a 65 y.o. female with a history of GERD, HLD, and PNA, who presents to the Emergency Department complaining of intermittent, achy, left sided, CP for 2 days. She also describes occasional burning sensation and pressure. She notes mild left shoulder pain (radiation). She reports associated episode of lightheadedness and mild diarrhea. She denies cardiac history,  SOB, abdominal pain, vomiting, and diaphoresis. She has taken ibuprofen with moderate relief. She has not taken ASA.    Past Medical History  Diagnosis Date  . Pneumonia     this October from which she has said inhailer  . Obesity   . Chicken pox as a child  . Measles as a child  . Mumps as a child  . Arthritis   . GERD (gastroesophageal reflux disease)   . Hyperlipidemia   . Hypertension   . Hypothyroidism   . Palpitations 08/01/2011  . Anemia 08/01/2011  . Hypokalemia 08/01/2011  . Leukocytosis 08/01/2011  . Low back pain radiating to right leg     intermittent and occasional numbness of skin over right hip in certain positions  . Candidal skin infection 08/11/2011  . UTI (lower urinary tract infection) 09/17/2011  . Knee pain, right 10/18/2011  . Shoulder pain, bilateral 11/01/2011  . Recurrent epistaxis 02/20/2012  . Preventative health care 12/13/2012  . Essential hypertension 07/17/2011    ACEI d/c 06/2011 for refractory cough> resolved   . RLS (restless legs syndrome) 11/01/2014  . Rheumatoid arthritis ([place]) 09/10/2011    Bone on bone in knees Pain in L>R knees.   . Insomnia  11/01/2014  . Welcome to Medicare preventive visit 07/11/2015  . Annual physical exam 07/11/2015    Follows with Dr Linda Hedges of GYN, Pap 2016 Follows with [facility] and [facility]. Dr [facility] prn Colonoscopy 2007, repat in 2017 Weight loss program at [facility], Dr Talmage Coin, Prehab Dr Renda Rolls, derm    Past Surgical History  Procedure Laterality Date  . Thyroidectomy      total for benign tumor, Parathyroid spared  . Tonsillectomy    . Cesarean section      X 3  . Joint replacement  2006    right hip replacement, secondary to congenital  hip defect  . Hernia repair  65 yrs old    right inguinal   Family History  Problem Relation Age of Onset  . Cancer Mother 37    lung/ smoker  . Heart disease Mother   . Hearing loss Mother     tachycardia  . Mental illness Mother     anxiety, claustrophobia  . Osteoarthritis Father   . Hyperlipidemia Father   . Hyperlipidemia Sister   . Migraines Sister   . Hypertension Sister   . Cancer Maternal Grandmother     colon  . Heart attack Maternal Grandfather   . Aneurysm Paternal Grandfather     abdominal  . Heart disease Paternal Grandfather     AAA rupture, smoker  Social History  Substance Use Topics  . Smoking status: Former Smoker -- 0.50 packs/day for 5 years    Types: Cigarettes    Quit date: 07/25/1971  . Smokeless tobacco: Never Used  . Alcohol Use: 8.4 oz/week    14 Glasses of wine per week   OB History    No data available     Review of Systems   10 systems reviewed and all are negative for acute change except as noted in the HPI.   Allergies  Accupril  Home Medications   Prior to Admission medications   Medication Sig Start Date End Date Taking? Authorizing Provider  buPROPion (WELLBUTRIN XL) 150 MG 24 hr tablet Take 150 mg by mouth daily. 06/24/15   Historical Provider, MD  cetirizine (ZYRTEC) 10 MG tablet Take 1 tablet (10 mg total) by mouth daily. 05/25/15   Mosie Lukes, MD   [employer] D3 1000 UNITS capsule TAKE ONE CAPSULE BY MOUTH EVERY DAY 07/20/15   Mosie Lukes, MD  cyclobenzaprine (FLEXERIL) 5 MG tablet Take 1 tablet (5 mg total) by mouth at bedtime as needed for muscle spasms. Patient taking differently: Take 5 mg by mouth at bedtime as needed for muscle spasms. PRN 01/12/14   Debbrah Alar, NP  famotidine (PEPCID) 20 MG tablet TAKE 1 TABLET BY MOUTH AT BEDTIME AS NEEDED FOR HEARTBURN OR INDIGESTION 04/29/15   Mosie Lukes, MD  fluticasone (FLONASE) 50 MCG/ACT nasal spray Place 2 sprays into both nostrils daily. Patient taking differently: Place 2 sprays into both nostrils daily as needed.  11/03/13   Debbrah Alar, NP  furosemide (LASIX) 20 MG tablet Take 1 tablet (20 mg total) by mouth daily as needed. 1 tablet daily as needed 09/03/12   Mosie Lukes, MD  hydrochlorothiazide (HYDRODIURIL) 25 MG tablet TAKE 1 TABLET BY MOUTH EVERY DAY 05/26/15   Mosie Lukes, MD  levothyroxine (SYNTHROID, LEVOTHROID) 100 MCG tablet Take 1 tablet (100 mcg total) by mouth daily before breakfast. 07/06/15   Mosie Lukes, MD  meloxicam (MOBIC) 7.5 MG tablet Take 1 tablet (7.5 mg total) by mouth daily. 01/12/14   Debbrah Alar, NP  metoprolol tartrate (LOPRESSOR) 25 MG tablet Take 1 tablet (25 mg total) by mouth 2 (two) times daily. 06/28/15   Mosie Lukes, MD  pantoprazole (PROTONIX) 40 MG tablet TAKE 1 TABLET BY MOUTH EVERY DAY 05/26/15   Mosie Lukes, MD  potassium chloride SA (K-DUR,KLOR-CON) 20 MEQ tablet Take 1 tablet (20 mEq total) by mouth daily as needed (lasix use). 09/03/12   Mosie Lukes, MD  PROAIR HFA 108 (90 BASE) MCG/ACT inhaler INHALE 2 PUFFS INTO THE LUNGS EVERY 6 HOURS AS NEEDED FOR WHEEZING/SHORTNESS OF BREATH 11/24/14   Mosie Lukes, MD  rOPINIRole (REQUIP) 0.5 MG tablet Take 1-2 tablets (0.5-1 mg total) by mouth at bedtime as needed. 06/28/15   Mosie Lukes, MD  simvastatin (ZOCOR) 40 MG tablet TAKE 1 TABLET BY MOUTH EVERY EVENING 11/17/14    Mosie Lukes, MD   BP 126/84 mmHg  Pulse 94  Temp(Src) 97.8 F (36.6 C) (Oral)  Resp 20  SpO2 96% Physical Exam  Constitutional: She is oriented to person, place, and time. She appears well-developed and well-nourished. No distress.  HENT:  Head: Normocephalic and atraumatic.  Nose: Nose normal.  Mouth/Throat: Oropharynx is clear and moist. No oropharyngeal exudate.  Eyes: Conjunctivae and EOM are normal. Pupils are equal, round, and reactive to light. No scleral icterus.  Neck: Normal range of motion. Neck supple. No JVD present. No tracheal deviation present. No thyromegaly present.  Cardiovascular: Normal rate, regular rhythm and normal heart sounds.  Exam reveals no gallop and no friction rub.   No murmur heard. Pulmonary/Chest: Effort normal and breath sounds normal. No respiratory distress. She has no wheezes. She exhibits no tenderness.  Abdominal: Soft. Bowel sounds are normal. She exhibits no distension and no mass. There is no tenderness. There is no rebound and no guarding.  Musculoskeletal: Normal range of motion. She exhibits no edema or tenderness.  Lymphadenopathy:    She has no cervical adenopathy.  Neurological: She is alert and oriented to person, place, and time. No cranial nerve deficit. She exhibits normal muscle tone.  Skin: Skin is warm and dry. No rash noted. No erythema. No pallor.  Nursing note and vitals reviewed.   ED Course  Procedures   DIAGNOSTIC STUDIES:  Oxygen Saturation is 96% on RA, normal by my interpretation.    COORDINATION OF CARE:  12:35 AM Discussed treatment plan with pt at bedside and pt agreed to plan.  Labs Review Labs Reviewed  BASIC METABOLIC PANEL - Abnormal; Notable for the following:    BUN 22 (*)    All other components within normal limits  CBC  TROPONIN I    Imaging Review Dg Chest 2 View  07/27/2015  CLINICAL DATA:  Central chest pain. Posterior left shoulder pain. Symptoms since last evening. EXAM: CHEST  2  VIEW COMPARISON:  Radiographs 09/01/2011 FINDINGS: Heart at the upper limits normal in size, unchanged. No pulmonary edema, confluent airspace disease, pleural effusion or pneumothorax. Minimal biapical pleural parenchymal scarring. Chronic change about the right greater than left shoulder. Degenerative change in the spine. IMPRESSION: No acute pulmonary process. Electronically Signed   By: Jeb Levering M.D.   On: 07/27/2015 20:17   I have personally reviewed and evaluated these images and lab results as part of my medical decision-making.   EKG Interpretation   Date/Time:  Tuesday July 27 2015 19:07:29 EST Ventricular Rate:  74 PR Interval:  162 QRS Duration: 96 QT Interval:  406 QTC Calculation: 450 R Axis:   -35 Text Interpretation:  Normal sinus rhythm Left axis deviation Cannot rule  out Anterior infarct , age undetermined Abnormal ECG tachycardia now  resolved Confirmed by Glynn Octave [phone]) on 07/28/2015 12:14:13  AM     1:08 AM Discussed case with hospitalist, Dr. Hal Hope  MDM   Final diagnoses:  Chest pain, unspecified chest pain type    Patient presents emergency department for evaluation of chest pain. I have concern for possible ACS. She was given aspirin and nitroglycerin. She will be admitted to the hospital for further care. EKG does not show any signs of ischemia, initial troponin is negative. Dr. Hal Hope accepts the patient to tele.   I personally performed the services described in this documentation, which was scribed in my presence. The recorded information has been reviewed and is accurate.      Everlene Balls, MD 07/28/15 0110

## 2015-07-28 NOTE — Care Management Obs Status (Signed)
[facility] NOTIFICATION   Patient Details  Name: Rebecca Brooks MRN: [id number] Date of Birth: [birth year]   Medicare Observation Status Notification Given:  Yes    Bethena Roys, RN 07/28/2015, 1:30 PM

## 2015-07-29 ENCOUNTER — Observation Stay (HOSPITAL_COMMUNITY): Payer: Medicare Other

## 2015-07-29 DIAGNOSIS — I10 Essential (primary) hypertension: Secondary | ICD-10-CM | POA: Diagnosis not present

## 2015-07-29 DIAGNOSIS — M1990 Unspecified osteoarthritis, unspecified site: Secondary | ICD-10-CM | POA: Diagnosis not present

## 2015-07-29 DIAGNOSIS — K219 Gastro-esophageal reflux disease without esophagitis: Secondary | ICD-10-CM | POA: Diagnosis not present

## 2015-07-29 DIAGNOSIS — E785 Hyperlipidemia, unspecified: Secondary | ICD-10-CM

## 2015-07-29 DIAGNOSIS — R079 Chest pain, unspecified: Secondary | ICD-10-CM | POA: Insufficient documentation

## 2015-07-29 DIAGNOSIS — E669 Obesity, unspecified: Secondary | ICD-10-CM | POA: Diagnosis not present

## 2015-07-29 LAB — NM MYOCAR MULTI W/SPECT W/WALL MOTION / EF
CHL CUP MPHR: 155 {beats}/min
CHL CUP RESTING HR STRESS: 71 {beats}/min
CSEPEDS: 0 s
CSEPEW: 1 METS
CSEPHR: 69 %
CSEPPHR: 108 {beats}/min
Exercise duration (min): 0 min

## 2015-07-29 MED ORDER — TECHNETIUM TC 99M SESTAMIBI - CARDIOLITE
30.0000 | Freq: Once | INTRAVENOUS | Status: AC | PRN
  Administered 2015-07-28: 11:00:00 30 via INTRAVENOUS

## 2015-07-29 MED ORDER — ASPIRIN EC 81 MG PO TBEC: 81.0000 mg | DELAYED_RELEASE_TABLET | Freq: Every day | ORAL | Status: DC

## 2015-07-29 MED ORDER — TECHNETIUM TC 99M SESTAMIBI GENERIC - CARDIOLITE
30.0000 | Freq: Once | INTRAVENOUS | Status: AC | PRN
  Administered 2015-07-29: 30 via INTRAVENOUS

## 2015-07-29 NOTE — Discharge Instructions (Signed)
Follow with Primary MD Penni Homans, MD in 7 days   Get CBC, CMP, 2checked  by Primary MD next visit.    Activity: As tolerated with Full fall precautions use walker/cane & assistance as needed   Disposition Home   Diet: Heart Healthy  , with feeding assistance and aspiration precautions.  For Heart failure patients - Check your Weight same time everyday, if you gain over 2 pounds, or you develop in leg swelling, experience more shortness of breath or chest pain, call your Primary MD immediately. Follow Cardiac Low Salt Diet and 1.5 lit/day fluid restriction.   On your next visit with your primary care physician please Get Medicines reviewed and adjusted.   Please request your Prim.MD to go over all Hospital Tests and Procedure/Radiological results at the follow up, please get all Hospital records sent to your Prim MD by signing hospital release before you go home.   If you experience worsening of your admission symptoms, develop shortness of breath, life threatening emergency, suicidal or homicidal thoughts you must seek medical attention immediately by calling 911 or calling your MD immediately  if symptoms less severe.  You Must read complete instructions/literature along with all the possible adverse reactions/side effects for all the Medicines you take and that have been prescribed to you. Take any new Medicines after you have completely understood and accpet all the possible adverse reactions/side effects.   Do not drive, operating heavy machinery, perform activities at heights, swimming or participation in water activities or provide baby sitting services if your were admitted for syncope or siezures until you have seen by Primary MD or a Neurologist and advised to do so again.  Do not drive when taking Pain medications.    Do not take more than prescribed Pain, Sleep and Anxiety Medications  Special Instructions: If you have smoked or chewed Tobacco  in the last 2 yrs please  stop smoking, stop any regular Alcohol  and or any Recreational drug use.  Wear Seat belts while driving.   Please note  You were cared for by a hospitalist during your hospital stay. If you have any questions about your discharge medications or the care you received while you were in the hospital after you are discharged, you can call the unit and asked to speak with the hospitalist on call if the hospitalist that took care of you is not available. Once you are discharged, your primary care physician will handle any further medical issues. Please note that NO REFILLS for any discharge medications will be authorized once you are discharged, as it is imperative that you return to your primary care physician (or establish a relationship with a primary care physician if you do not have one) for your aftercare needs so that they can reassess your need for medications and monitor your lab values.

## 2015-07-29 NOTE — Progress Notes (Signed)
Pt discharged home with husband.  Reviewed discharge instructions and education, all questions answered.  Assessment unchanged from earlier.  

## 2015-07-29 NOTE — Plan of Care (Signed)
Problem: Consults Goal: Chest Pain Patient Education (See Patient Education module for education specifics.)  Outcome: Completed/Met Date Met:  07/29/15 Pt received information regarding cp  Problem: Phase I Progression Outcomes Goal: MD aware of Cardiac Marker results Outcome: Completed/Met Date Met:  07/29/15 MD aware of pt trops  Problem: Phase II Progression Outcomes Goal: Anginal pain relieved Outcome: Completed/Met Date Met:  07/29/15 Pt is remaining free from chest pain at this time.  Will continue to monitor throughout hospitalization  Goal: Stress Test if indicated Outcome: Completed/Met Date Met:  07/29/15 Pt has stress test ordered  Problem: Phase III Progression Outcomes Goal: No anginal pain Outcome: Completed/Met Date Met:  07/29/15 Pt is remaining free from cp Goal: Discharge plan remains appropriate-arrangements made Outcome: Completed/Met Date Met:  07/29/15 Pt will be discharged home later today if stress test negative Goal: Tolerating diet Outcome: Completed/Met Date Met:  07/29/15 Pt currently tolerating diet with no problems   Problem: Discharge Progression Outcomes Goal: Tolerates diet Outcome: Completed/Met Date Met:  07/29/15 Pt is tolerating diet Goal: Activity appropriate for discharge plan Outcome: Completed/Met Date Met:  07/29/15 Pt is at baseline with activity and is able to tolerate activity with no difficulties besides generalized weakness from arthritis in her knees

## 2015-07-29 NOTE — Progress Notes (Signed)
    SUBJECTIVE:  No further chest pain. She had some mild    PHYSICAL EXAM Filed Vitals:   07/28/15 1122 07/28/15 1420 07/28/15 2030 07/29/15 0648  BP:  116/54 103/44 144/80  Pulse: 90 75 94 78  Temp:  97.7 F (36.5 C) 98.4 F (36.9 C) 98 F (36.7 C)  TempSrc:  Oral Oral Oral  Resp:  15 18 18   Height:      Weight:    260 lb 8 oz (118.162 kg)  SpO2:  96% 97% 97%   General:  No distress Lungs:  Clear Heart:  RRR Abdomen:  Positive bowel sounds, no rebound no guarding Extremities:  No edema  LABS: Lab Results  Component Value Date   TROPONINI <0.03 07/28/2015   Results for orders placed or performed during the hospital encounter of 07/27/15 (from the past 24 hour(s))  Troponin I (q 6hr x 3)     Status: None   Collection Time: 07/28/15  1:50 PM  Result Value Ref Range   Troponin I <0.03 <0.031 ng/mL  Lipid panel     Status: Abnormal   Collection Time: 07/28/15  1:50 PM  Result Value Ref Range   Cholesterol 214 (H) 0 - 200 mg/dL   Triglycerides 134 <150 mg/dL   HDL 64 >40 mg/dL   Total CHOL/HDL Ratio 3.3 RATIO   VLDL 27 0 - 40 mg/dL   LDL Cholesterol 123 (H) 0 - 99 mg/dL  Troponin I (q 6hr x 3)     Status: None   Collection Time: 07/28/15  8:34 PM  Result Value Ref Range   Troponin I <0.03 <0.031 ng/mL    Intake/Output Summary (Last 24 hours) at 07/29/15 0928 Last data filed at 07/29/15 0649  Gross per 24 hour  Intake    480 ml  Output    700 ml  Net   -220 ml    EKG:    Sinus rhythm, rate 71, axis within normal limits, intervals within normal limits, no acute ST-T wave changes.  ASSESSMENT AND PLAN:  CHEST PAIN:    Enzymes negative.   Stress test complete.  Results pending.  Home if OK.    PROLONGED QT:    Repeat EKG today with normal QT.  No further work up.      Jeneen Rinks [facility] 07/29/2015 9:28 AM

## 2015-07-29 NOTE — Discharge Summary (Signed)
Rebecca Brooks, is a 65 y.o. female  DOB May 08, 1951  MRN [id number].  Admission date:  07/27/2015  Admitting Physician  Rise Patience, MD  Discharge Date:  07/29/2015   Primary MD  Penni Homans, MD  Recommendations for primary care physician for things to follow:  - Patient to follow with cardiology as an outpatient   Admission Diagnosis  Chest pain, unspecified chest pain type [R07.9]   Discharge Diagnosis  Chest pain, unspecified chest pain type [R07.9]    Principal Problem:   Chest pain Active Problems:   Hypothyroidism   Hyperlipidemia   Essential hypertension   Pain in the chest   Chest pain at rest      Past Medical History  Diagnosis Date  . Pneumonia     this October from which she has said inhailer  . Obesity   . Chicken pox as a child  . Measles as a child  . Mumps as a child  . Arthritis   . GERD (gastroesophageal reflux disease)   . Hyperlipidemia   . Hypertension   . Hypothyroidism   . Low back pain radiating to right leg     intermittent and occasional numbness of skin over right hip in certain positions  . Recurrent epistaxis 02/20/2012  . Essential hypertension 07/17/2011    ACEI d/c 06/2011 for refractory cough> resolved   . RLS (restless legs syndrome) 11/01/2014  . Rheumatoid arthritis ([place]) 09/10/2011    Bone on bone in knees Pain in L>R knees.   . Insomnia 11/01/2014    Past Surgical History  Procedure Laterality Date  . Thyroidectomy      total for benign tumor, Parathyroid spared  . Tonsillectomy    . Cesarean section      X 3  . Joint replacement  2006    right hip replacement, secondary to congenital  hip defect  . Hernia repair  65 yrs old    right inguinal       History of present illness and  Hospital Course:     Kindly see H&P for history of present illness and admission details, please review complete Labs, Consult reports and Test  reports for all details in brief  HPI  from the history and physical done on the day of admission 07/28/2015 YESINIA Brooks is a 65 y.o. female with history of hypertension hyperlipidemia and hypothyroidism presents to the ER because of chest pain. Patient started exposing chest pain night before this. Patient's chest pain is retrosternal pressure like at times burning in sensation. Denies any exertional symptoms. Pain is present even at rest. Yesterday morning when patient woke up patient's pain was resolved but started happening again later in the afternoon. In the ER EKG cardiac markers and chest x-ray were unremarkable. Pain improved with sublingual nitroglycerin. Patient has been admitted for further management of chest pain. Patient was recently placed on naltrexone for appetite.    Hospital Course  65 y.o. female with history of hypertension hyperlipidemia and hypothyroidism presents to  the ER because of chest pain, seen by cardiology, and had 2 day exercise myoview, which was intermediate risk.  Chest pain - Division were consulted given it was concerning for cardiac etiologyas pain is responsive to sublingual nitroglycerin. EKG nonacute, Troponin negative x 2 , she had 2 day exercise myoview stress test results were significant for moderate sized area of reversible perfusion and mid anterior/anterior lateral and inferior wall, overall intermediate risk stress test with EF 74%, discussed this finding with Dr. Percival Spanish , who evaluated the imaging himself, where there is probable artifact contributing to these findings, and recommendation is for outpatient follow-up with him, where she'll likely need stress echo as an outpatient, and to be started on aspirin 81 mg daily. - Continue with aspirin, statin, beta blockers  Hypertension - Continue with hydrochlorothiazide and metoprolol  Hyperlipidemia - Continue with statin   Discharge Condition: stable   Follow UP  Follow-up Information      Follow up with Penni Homans, MD. Schedule an appointment as soon as possible for a visit in 1 week.   Specialty:  Family Medicine   Why:  Posthospitalization follow-up   Contact information:   [address] [place] [address] [phone]       Follow up with Minus Breeding, MD.   Specialty:  Cardiology   Why:  you will be contacted from his office [place] appointment   Contact information:   [address] [place] [address] [phone]         Discharge Instructions  and  Discharge Medications     Discharge Instructions    Discharge instructions    Complete by:  As directed   Follow with Primary MD Penni Homans, MD in 7 days   Get CBC, CMP, 2checked  by Primary MD next visit.    Activity: As tolerated with Full fall precautions use walker/cane & assistance as needed   Disposition Home   Diet: Heart Healthy  , with feeding assistance and aspiration precautions.  For Heart failure patients - Check your Weight same time everyday, if you gain over 2 pounds, or you develop in leg swelling, experience more shortness of breath or chest pain, call your Primary MD immediately. Follow Cardiac Low Salt Diet and 1.5 lit/day fluid restriction.   On your next visit with your primary care physician please Get Medicines reviewed and adjusted.   Please request your Prim.MD to go over all Hospital Tests and Procedure/Radiological results at the follow up, please get all Hospital records sent to your Prim MD by signing hospital release before you go home.   If you experience worsening of your admission symptoms, develop shortness of breath, life threatening emergency, suicidal or homicidal thoughts you must seek medical attention immediately by calling 911 or calling your MD immediately  if symptoms less severe.  You Must read complete instructions/literature along with all the possible adverse reactions/side effects for all the Medicines you  take and that have been prescribed to you. Take any new Medicines after you have completely understood and accpet all the possible adverse reactions/side effects.   Do not drive, operating heavy machinery, perform activities at heights, swimming or participation in water activities or provide baby sitting services if your were admitted for syncope or siezures until you have seen by Primary MD or a Neurologist and advised to do so again.  Do not drive when taking Pain medications.    Do not take more than prescribed Pain, Sleep  and Anxiety Medications  Special Instructions: If you have smoked or chewed Tobacco  in the last 2 yrs please stop smoking, stop any regular Alcohol  and or any Recreational drug use.  Wear Seat belts while driving.   Please note  You were cared for by a hospitalist during your hospital stay. If you have any questions about your discharge medications or the care you received while you were in the hospital after you are discharged, you can call the unit and asked to speak with the hospitalist on call if the hospitalist that took care of you is not available. Once you are discharged, your primary care physician will handle any further medical issues. Please note that NO REFILLS for any discharge medications will be authorized once you are discharged, as it is imperative that you return to your primary care physician (or establish a relationship with a primary care physician if you do not have one) for your aftercare needs so that they can reassess your need for medications and monitor your lab values.     Increase activity slowly    Complete by:  As directed             Medication List    TAKE these medications        aspirin EC 81 MG tablet  Take 1 tablet (81 mg total) by mouth daily.     cetirizine 10 MG tablet  Commonly known as:  ZYRTEC  Take 1 tablet (10 mg total) by mouth daily.     [employer] D3 1000 units capsule  Generic drug:  Cholecalciferol  TAKE ONE  CAPSULE BY MOUTH EVERY DAY     cyclobenzaprine 5 MG tablet  Commonly known as:  FLEXERIL  Take 1 tablet (5 mg total) by mouth at bedtime as needed for muscle spasms.     famotidine 20 MG tablet  Commonly known as:  PEPCID  TAKE 1 TABLET BY MOUTH AT BEDTIME AS NEEDED FOR HEARTBURN OR INDIGESTION     fluticasone 50 MCG/ACT nasal spray  Commonly known as:  FLONASE  Place 2 sprays into both nostrils daily.     furosemide 20 MG tablet  Commonly known as:  LASIX  Take 1 tablet (20 mg total) by mouth daily as needed. 1 tablet daily as needed     hydrochlorothiazide 25 MG tablet  Commonly known as:  HYDRODIURIL  TAKE 1 TABLET BY MOUTH EVERY DAY     levothyroxine 100 MCG tablet  Commonly known as:  SYNTHROID, LEVOTHROID  Take 1 tablet (100 mcg total) by mouth daily before breakfast.     meloxicam 7.5 MG tablet  Commonly known as:  MOBIC  Take 1 tablet (7.5 mg total) by mouth daily.     metoprolol tartrate 25 MG tablet  Commonly known as:  LOPRESSOR  Take 1 tablet (25 mg total) by mouth 2 (two) times daily.     naltrexone 50 MG tablet  Commonly known as:  DEPADE  Take 25 mg by mouth daily.     pantoprazole 40 MG tablet  Commonly known as:  PROTONIX  TAKE 1 TABLET BY MOUTH EVERY DAY     potassium chloride SA 20 MEQ tablet  Commonly known as:  K-DUR,KLOR-CON  Take 1 tablet (20 mEq total) by mouth daily as needed (lasix use).     PROAIR HFA 108 (90 Base) MCG/ACT inhaler  Generic drug:  albuterol  INHALE 2 PUFFS INTO THE LUNGS EVERY 6 HOURS AS NEEDED FOR WHEEZING/SHORTNESS OF  BREATH     rOPINIRole 0.5 MG tablet  Commonly known as:  REQUIP  Take 1-2 tablets (0.5-1 mg total) by mouth at bedtime as needed.     simvastatin 40 MG tablet  Commonly known as:  ZOCOR  TAKE 1 TABLET BY MOUTH EVERY EVENING     WELLBUTRIN XL 150 MG 24 hr tablet  Generic drug:  buPROPion  Take 150 mg by mouth daily.          Diet and Activity recommendation: See Discharge Instructions  above   Consults obtained -  cardiology   Major procedures and Radiology Reports - PLEASE review detailed and final reports for all details, in brief -      Dg Chest 2 View  07/27/2015  CLINICAL DATA:  Central chest pain. Posterior left shoulder pain. Symptoms since last evening. EXAM: CHEST  2 VIEW COMPARISON:  Radiographs 09/01/2011 FINDINGS: Heart at the upper limits normal in size, unchanged. No pulmonary edema, confluent airspace disease, pleural effusion or pneumothorax. Minimal biapical pleural parenchymal scarring. Chronic change about the right greater than left shoulder. Degenerative change in the spine. IMPRESSION: No acute pulmonary process. Electronically Signed   By: Jeb Levering M.D.   On: 07/27/2015 20:17   Nm Myocar Multi W/spect W/wall Motion / Ef  07/29/2015  CLINICAL DATA:  Chest pain with history of hypertension and gastroesophageal reflux disease. EXAM: MYOCARDIAL IMAGING WITH SPECT (REST AND PHARMACOLOGIC-STRESS - 2 DAY PROTOCOL) GATED LEFT VENTRICULAR WALL MOTION STUDY LEFT VENTRICULAR EJECTION FRACTION TECHNIQUE: Standard myocardial SPECT imaging was performed after resting intravenous injection of 30 mCi Tc-63m sestamibi. Subsequently, on a second day, intravenous infusion of Lexiscan was performed under the supervision of the Cardiology staff. At peak effect of the drug, 30 mCi Tc-36m sestamibi was injected intravenously and standard myocardial SPECT imaging was performed. Quantitative gated imaging was also performed to evaluate left ventricular wall motion, and estimate left ventricular ejection fraction. COMPARISON:  Chest radiographs 07/27/2015.  Chest CT 07/16/2011. FINDINGS: Perfusion: Rest images demonstrate normal left ventricular activity. On the stress images, there is mildly decreased activity within the mid anterior, anterolateral and inferior walls. Summed difference score is 10. Wall Motion: Normal left ventricular wall motion. No left ventricular dilation.  Left Ventricular Ejection Fraction: 74 % End diastolic volume 62 ml End systolic volume 16 ml IMPRESSION: 1. Moderate-size area of reversible perfusion involving the mid anterior, anterolateral and inferior wall suspicious for myocardial ischemia. 2. Normal left ventricular wall motion. 3. Left ventricular ejection fraction 74% 4. Intermediate-risk stress test findings*. *2012 Appropriate Use Criteria for Coronary Revascularization Focused Update: J Am Coll Cardiol. [id number]. http://content.airportbarriers.com.aspx?articleid=[email or web address] Electronically Signed   By: Richardean Sale M.D.   On: 07/29/2015 09:50    Micro Results     No results found for this or any previous visit (from the past 240 hour(s)).     Today   Subjective:   Kemberly Kessenich today has no headache,no chest abdominal pain,no new weakness tingling or numbness, feels much better wants to go home today.   Objective:   Blood pressure 135/70, pulse 69, temperature 98.8 F (37.1 C), temperature source Oral, resp. rate 18, height 5\' 3"  (1.6 m), weight 118.162 kg (260 lb 8 oz), SpO2 96 %.   Intake/Output Summary (Last 24 hours) at 07/29/15 1415 Last data filed at 07/29/15 1300  Gross per 24 hour  Intake    840 ml  Output    700 ml  Net    140 ml  Exam Awake Alert, Oriented x 3, No new F.N deficits, Normal affect [place].AT,PERRAL Supple Neck,No JVD, No cervical lymphadenopathy appriciated.  Symmetrical Chest wall movement, Good air movement bilaterally, CTAB RRR,No Gallops,Rubs or new Murmurs, No Parasternal Heave +ve B.Sounds, Abd Soft, Non tender, No organomegaly appriciated, No rebound -guarding or rigidity. No Cyanosis, Clubbing or edema, No new Rash or bruise  Data Review   CBC w Diff: Lab Results  Component Value Date   WBC 12.0* 07/28/2015   WBC 11.8* 04/26/2012   HGB 13.1 07/28/2015   HGB 13.2 04/26/2012   HCT 40.3 07/28/2015   HCT 40.2 04/26/2012   PLT 336 07/28/2015   PLT 386 04/26/2012    LYMPHOPCT 18.3 04/26/2012   LYMPHOPCT 11* 07/21/2011   MONOPCT 6.0 04/26/2012   MONOPCT 6 07/21/2011   EOSPCT 2.1 04/26/2012   EOSPCT 1 07/21/2011   BASOPCT 0.5 04/26/2012   BASOPCT 0 07/21/2011    CMP: Lab Results  Component Value Date   NA 141 07/27/2015   K 3.8 07/27/2015   CL 104 07/27/2015   CO2 29 07/27/2015   BUN 22* 07/27/2015   CREATININE 0.64 07/28/2015   CREATININE 0.73 09/03/2012   PROT 6.3 12/02/2012   ALBUMIN 3.4* 12/02/2012   ALBUMIN 3.4* 12/02/2012   BILITOT 0.6 12/02/2012   ALKPHOS 93 12/02/2012   AST 21 12/02/2012   ALT 21 12/02/2012  .   Total Time in preparing paper work, data evaluation and todays exam - 35 minutes  Daana Petrasek M.D on 07/29/2015 at 2:15 PM  [facility]   Office  [phone]

## 2015-07-30 ENCOUNTER — Telehealth: Payer: Self-pay | Admitting: Cardiology

## 2015-07-30 DIAGNOSIS — Z9289 Personal history of other medical treatment: Secondary | ICD-10-CM

## 2015-07-30 DIAGNOSIS — R079 Chest pain, unspecified: Secondary | ICD-10-CM

## 2015-07-30 NOTE — Telephone Encounter (Signed)
Spoke w/ patient.  Discharged from hospital yesterday. States it was reported to her that the stress test performed in hospital was not normal - could not obtain a good image of the heart. She was supposed to be set up for a stress test w/ [place] - I don't know if this was ordered, cannot locate an order in system. She is unsure if this was at Dr. Rosezella [place] request or the discharging physician.  Pt aware I am sending to Dr. Percival Spanish for clarification.

## 2015-07-30 NOTE — Telephone Encounter (Signed)
NEw Message  Pt stated that during hospital stay- Dr Percival Spanish had stated that he wanted a GXT- no order in syst. Pt has [place] sched for 1/26 w/ [facility]. Please call back and discuss.

## 2015-08-03 ENCOUNTER — Telehealth: Payer: Self-pay

## 2015-08-03 NOTE — Telephone Encounter (Addendum)
Pt states she continues to experience intermittent chest pain.  She has an appt with Dr. Percival Spanish on 08/18/14 and plans to keep that appt.  She says she would be willing to schedule a follow up appt, but only wants to see Dr. Charlett Blake.  Next available not until February.    Please advise.

## 2015-08-03 NOTE — Telephone Encounter (Signed)
Set her up with an appt on Thursday if she can and I will do follow up

## 2015-08-04 ENCOUNTER — Other Ambulatory Visit: Payer: Self-pay | Admitting: Family Medicine

## 2015-08-04 NOTE — Telephone Encounter (Signed)
Please arrange a stress echocardiogram.  Lexiscan Myoview was inconclusive.

## 2015-08-04 NOTE — Telephone Encounter (Signed)
Stress echo ordered. Message sent to have this scheduled. Pt contacted and informed we will arrange, voiced understanding. No further questions at this time, she is aware to call if further questions or concerns.

## 2015-08-04 NOTE — Telephone Encounter (Signed)
Hospital follow up appt scheduled with Dr. Charlett Blake on 08/05/15 (tomorrow) at 1:30 pm per patient's preference.   Pt states she had another episode of chest pain today.   States pain starts in her chest and goes straight back to her left shoulder. Pain didn't last long.  Happened at rest.  She denied any accompanying symptoms.  No new symptoms. Pt will keep appt tomorrow as scheduled.  Will return to ER if symptoms worsen or new symptoms develop.

## 2015-08-05 ENCOUNTER — Encounter: Payer: Self-pay | Admitting: Family Medicine

## 2015-08-05 ENCOUNTER — Ambulatory Visit (INDEPENDENT_AMBULATORY_CARE_PROVIDER_SITE_OTHER): Payer: Medicare Other | Admitting: Family Medicine

## 2015-08-05 VITALS — BP 132/72 | HR 85 | Temp 97.8°F | Ht 63.0 in | Wt 268.0 lb

## 2015-08-05 DIAGNOSIS — R079 Chest pain, unspecified: Secondary | ICD-10-CM | POA: Diagnosis not present

## 2015-08-05 DIAGNOSIS — E669 Obesity, unspecified: Secondary | ICD-10-CM | POA: Diagnosis not present

## 2015-08-05 DIAGNOSIS — I10 Essential (primary) hypertension: Secondary | ICD-10-CM

## 2015-08-05 DIAGNOSIS — E785 Hyperlipidemia, unspecified: Secondary | ICD-10-CM

## 2015-08-05 DIAGNOSIS — E039 Hypothyroidism, unspecified: Secondary | ICD-10-CM

## 2015-08-05 DIAGNOSIS — K219 Gastro-esophageal reflux disease without esophagitis: Secondary | ICD-10-CM | POA: Diagnosis not present

## 2015-08-05 DIAGNOSIS — G4700 Insomnia, unspecified: Secondary | ICD-10-CM

## 2015-08-05 MED ORDER — NITROGLYCERIN 0.4 MG SL SUBL: 0.4000 mg | SUBLINGUAL_TABLET | SUBLINGUAL | Status: DC | PRN

## 2015-08-05 MED ORDER — ZOLPIDEM TARTRATE 10 MG PO TABS: 10.0000 mg | ORAL_TABLET | Freq: Every evening | ORAL | Status: DC | PRN

## 2015-08-05 MED ORDER — HYOSCYAMINE SULFATE 0.125 MG SL SUBL: 0.1250 mg | SUBLINGUAL_TABLET | SUBLINGUAL | Status: DC | PRN

## 2015-08-05 NOTE — Assessment & Plan Note (Addendum)
Atypical sometimes achy sometimes burning. Off and on for the past 1-weeks. No SOB/nausea, or diaphoresis. Does note a skipped beat. Had a work up in hospital to rule out cardiac cause. Has cardiology follow up and will seek care if symptoms worsen

## 2015-08-05 NOTE — Progress Notes (Signed)
HPI Review of Systems  Cardiovascular: Positive for chest pain.   Physical Exam

## 2015-08-05 NOTE — Patient Instructions (Addendum)
[place] AS NEEDED  Angina Pectoris Angina pectoris, often called angina, is extreme discomfort in the chest, neck, or arm. This is caused by a lack of blood in the middle and thickest layer of the heart wall (myocardium). There are four types of angina:  Stable angina. Stable angina usually occurs in episodes of predictable frequency and duration. It is usually brought on by physical activity, stress, or excitement. Stable angina usually lasts a few minutes and can often be relieved by a medicine that you place under your tongue. This medicine is called sublingual nitroglycerin.  Unstable angina. Unstable angina can occur even when you are doing little or no physical activity. It can even occur while you are sleeping or when you are at rest. It can suddenly increase in severity or frequency. It may not be relieved by sublingual nitroglycerin, and it can last up to 30 minutes.  Microvascular angina. This type of angina is caused by a disorder of tiny blood vessels called arterioles. Microvascular angina is more common in women. The pain may be more severe and last longer than other types of angina pectoris.  Prinzmetal or variant angina. This type of angina pectoris is rare and usually occurs when you are doing little or no physical activity. It especially occurs in the early morning hours. CAUSES Atherosclerosis is the cause of angina. This is the buildup of fat and cholesterol (plaque) on the inside of the arteries. Over time, the plaque may narrow or block the artery, and this will lessen blood flow to the heart. Plaque can also become weak and break off within a coronary artery to form a clot and cause a sudden blockage. RISK FACTORS Risk factors common to both men and women include:  High cholesterol levels.  High blood pressure (hypertension).  Tobacco use.  Diabetes.  Family history of angina.  Obesity.  Lack of exercise.  A diet high in saturated fats. Women are at greater risk  for angina if they are:  Over age 73.  Postmenopausal. SYMPTOMS Many people do not experience any symptoms during the early stages of angina. As the condition progresses, symptoms common to both men and women may include:  Chest pain.  The pain can be described as a crushing or squeezing in the chest, or a tightness, pressure, fullness, or heaviness in the chest.  The pain can last more than a few minutes, or it can stop and recur.  Pain in the arms, neck, jaw, or back.  Unexplained heartburn or indigestion.  Shortness of breath.  Nausea.  Sudden cold sweats.  Sudden light-headedness. Many women have chest discomfort and some of the other symptoms. However, women often have different (atypical) symptoms, such as:   Fatigue.  Unexplained feelings of nervousness or anxiety.  Unexplained weakness.  Dizziness or fainting. Sometimes, women may have angina without any symptoms. DIAGNOSIS  Tests to diagnose angina may include:  ECG (electrocardiogram).  Exercise stress test. This looks for signs of blockage when the heart is being exercised.  Pharmacologic stress test. This test looks for signs of blockage when the heart is being stressed with a medicine.  Blood tests.  Coronary angiogram. This is a procedure to look at the coronary arteries to see if there is any blockage. TREATMENT  The treatment of angina may include the following:  Healthy behavioral changes to reduce or control risk factors.  Medicine.  Coronary stenting.A stent helps to keep an artery open.  Coronary angioplasty. This procedure widens a narrowed or blocked  artery.  Coronary arterybypass surgery. This will allow your blood to pass the blockage (bypass) to reach your heart. HOME CARE INSTRUCTIONS   Take medicines only as directed by your health care provider.  Do not take the following medicines unless your health care provider approves:  Nonsteroidal anti-inflammatory drugs (NSAIDs),  such as ibuprofen, naproxen, or celecoxib.  Vitamin supplements that contain vitamin A, vitamin E, or both.  Hormone replacement therapy that contains estrogen with or without progestin.  Manage other health conditions such as hypertension and diabetes as directed by your health care provider.  Follow a heart-healthy diet. A dietitian can help to educate you about healthy food options and changes.  Use healthy cooking methods such as roasting, grilling, broiling, baking, poaching, steaming, or stir-frying. Talk to a dietitian to learn more about healthy cooking methods.  Follow an exercise program approved by your health care provider.  Maintain a healthy weight. Lose weight as approved by your health care provider.  Plan rest periods when fatigued.  Learn to manage stress.  Do not use any tobacco products, including cigarettes, chewing tobacco, or electronic cigarettes. If you need help quitting, ask your health care provider.  If you drink alcohol, and your health care provider approves, limit your alcohol intake to no more than 1 drink per day. One drink equals 12 ounces of beer, 5 ounces of wine, or 1 ounces of hard liquor.  Stop illegal drug use.  Keep all follow-up visits as directed by your health care provider. This is important. SEEK IMMEDIATE MEDICAL CARE IF:   You have pain in your chest, neck, arm, jaw, stomach, or back that lasts more than a few minutes, is recurring, or is unrelieved by taking sublingualnitroglycerin.  You have profuse sweating without cause.  You have unexplained:  Heartburn or indigestion.  Shortness of breath or difficulty breathing.  Nausea or vomiting.  Fatigue.  Feelings of nervousness or anxiety.  Weakness.  Diarrhea.  You have sudden light-headedness or dizziness.  You faint. These symptoms may represent a serious problem that is an emergency. Do not wait to see if the symptoms will go away. Get medical help right away. Call  your local emergency services (911 in the [place] Do not drive yourself to the hospital.   This information is not intended to replace advice given to you by your health care provider. Make sure you discuss any questions you have with your health care provider.   Document Released: 07/10/2005 Document Revised: 07/31/2014 Document Reviewed: 11/11/2013 Elsevier Interactive Patient Education [employer].

## 2015-08-05 NOTE — Progress Notes (Signed)
Pre visit review using our clinic review tool, if applicable. No additional management support is needed unless otherwise documented below in the visit note. 

## 2015-08-05 NOTE — Progress Notes (Signed)
Subjective:    [name], female    DOB: 06/12/1951, 65 y.o.   MRN: [id number]  Chief Complaint  Patient presents with  . Hospitalization Follow-up    HPI Patient is in today for hospital follow up. She was hospitalized with chest pain. In the hospital her cardiac workup was negative for any cardiac cause but she continues to have symptoms. She endorses dyspepsia and burning at times but also acknowledges sometimes palpitations. She was awoken with chest pain yesterday with a burning sensation that hurt to her back. She has not had that symptom persist. She's also had some intermittent episodes of feeling her heart skip a beat and feeling somewhat lightheaded although this has not happened for several days. She continues to struggle with insomnia but zolpidem is helpful and she is requesting a refill. Denies SOB/HA/congestion/fevers/GI or GU c/o. Taking meds as prescribed  Past Medical History  Diagnosis Date  . Pneumonia     this October from which she has said inhailer  . Obesity   . Chicken pox as a child  . Measles as a child  . Mumps as a child  . Arthritis   . GERD (gastroesophageal reflux disease)   . Hyperlipidemia   . Hypertension   . Hypothyroidism   . Low back pain radiating to right leg     intermittent and occasional numbness of skin over right hip in certain positions  . Recurrent epistaxis 02/20/2012  . Essential hypertension 07/17/2011    ACEI d/c 06/2011 for refractory cough> resolved   . RLS (restless legs syndrome) 11/01/2014  . Rheumatoid arthritis ([place]) 09/10/2011    Bone on bone in knees Pain in L>R knees.   . Insomnia 11/01/2014    Past Surgical History  Procedure Laterality Date  . Thyroidectomy      total for benign tumor, Parathyroid spared  . Tonsillectomy    . Cesarean section      X 3  . Total hip arthroplasty Right 2006    secondary to congenital  hip defect  . Inguinal hernia repair Right 65 yrs old    Family History    Problem Relation Age of Onset  . Lung cancer Mother 70    smoker  . Heart Problems Mother     tachycardia  . Hearing loss Mother   . Anxiety disorder Mother     anxiety, claustrophobia  . Osteoarthritis Father   . Hyperlipidemia Father   . Hyperlipidemia Sister   . Migraines Sister   . Hypertension Sister   . Colon cancer Maternal Grandmother   . Heart attack Maternal Grandfather   . Aneurysm Paternal Grandfather     abdominal  . Heart disease Paternal Grandfather     AAA rupture, smoker  . Anuerysm Father     AAA  . Heart Problems Sister     tachycardia    Social History   Social History  . Marital Status: Married    Spouse Name: N/A  . Number of Children: 3  . 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 Smoking status: Former Smoker -- 0.50 packs/day for 5 years    Types: Cigarettes    Quit date: 07/25/1971  . Smokeless tobacco: Never Used  . Alcohol Use: 8.4 oz/week    14 Glasses of wine per week     Comment: socially  . Drug Use: No  .  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 Not on file   Social History Narrative    Outpatient Prescriptions Prior to Visit  Medication Sig Dispense Refill  . aspirin EC 81 MG tablet Take 1 tablet (81 mg total) by mouth daily.    Marland Kitchen buPROPion (WELLBUTRIN XL) 150 MG 24 hr tablet Take 150 mg by mouth daily.    . cetirizine (ZYRTEC) 10 MG tablet Take 1 tablet (10 mg total) by mouth daily. 30 tablet 10  . [facility] D3 1000 UNITS capsule TAKE ONE CAPSULE BY MOUTH EVERY DAY 90 capsule 1  . famotidine (PEPCID) 20 MG tablet TAKE 1 TABLET BY MOUTH AT BEDTIME AS NEEDED FOR HEARTBURN OR INDIGESTION 90 tablet 1  . fluticasone (FLONASE) 50 MCG/ACT nasal spray Place 2 sprays into both nostrils daily. (Patient taking differently: Place 2 sprays into both nostrils as needed. ) 16 g 2  . furosemide (LASIX) 20 MG  tablet Take 1 tablet (20 mg total) by mouth daily as needed. 1 tablet daily as needed (Patient not taking: Reported on 08/13/2015) 30 tablet 3  . hydrochlorothiazide (HYDRODIURIL) 25 MG tablet TAKE 1 TABLET BY MOUTH DAILY 30 tablet 0  . levothyroxine (SYNTHROID, LEVOTHROID) 100 MCG tablet Take 1 tablet (100 mcg total) by mouth daily before breakfast. 90 tablet 1  . metoprolol tartrate (LOPRESSOR) 25 MG tablet Take 1 tablet (25 mg total) by mouth 2 (two) times daily. 60 tablet 3  . naltrexone (DEPADE) 50 MG tablet Take 25 mg by mouth daily.    . pantoprazole (PROTONIX) 40 MG tablet TAKE 1 TABLET BY MOUTH DAILY 30 tablet 0  . potassium chloride SA (K-DUR,KLOR-CON) 20 MEQ tablet Take 1 tablet (20 mEq total) by mouth daily as needed (lasix use). (Patient not taking: Reported on 08/11/2015) 30 tablet 3  . PROAIR HFA 108 (90 BASE) MCG/ACT inhaler INHALE 2 PUFFS INTO THE LUNGS EVERY 6 HOURS AS NEEDED FOR WHEEZING/SHORTNESS OF BREATH (Patient not taking: Reported on 08/13/2015) 8.5 Inhaler 0  . rOPINIRole (REQUIP) 0.5 MG tablet Take 1-2 tablets (0.5-1 mg total) by mouth at bedtime as needed. 60 tablet 3  . simvastatin (ZOCOR) 40 MG tablet TAKE 1 TABLET BY MOUTH EVERY EVENING 90 tablet 3  . cyclobenzaprine (FLEXERIL) 5 MG tablet Take 1 tablet (5 mg total) by mouth at bedtime as needed for muscle spasms. (Patient not taking: Reported on 08/11/2015) 14 tablet 0  . meloxicam (MOBIC) 7.5 MG tablet Take 1 tablet (7.5 mg total) by mouth daily. 14 tablet 0   No facility-administered medications prior to visit.    Allergies  Allergen Reactions  . Accupril [Quinapril Hcl] Cough    Review of Systems  Constitutional: Negative for fever, chills and malaise/fatigue.  HENT: Negative for congestion and hearing loss.   Eyes: Negative for discharge.  Respiratory: Negative for cough, sputum production and shortness of breath.   Cardiovascular: Positive for chest pain and palpitations. Negative for leg swelling.    Gastrointestinal: Positive for heartburn, nausea and abdominal pain. Negative for vomiting, diarrhea, constipation and blood in stool.  Genitourinary: Negative for dysuria, urgency, frequency and hematuria.  Musculoskeletal: Negative for myalgias, back pain and falls.  Skin: Negative for rash.  Neurological: Positive for dizziness. Negative for sensory change, loss of consciousness, weakness and headaches.  Endo/Heme/Allergies: Negative for environmental allergies. Does not bruise/bleed easily.  Psychiatric/Behavioral: Negative for depression and suicidal ideas. The patient has insomnia. The  patient is not nervous/anxious.        Objective:    Physical Exam  Constitutional: She is oriented to person, place, and time. She appears well-developed and well-nourished. No distress.  HENT:  Head: Normocephalic and atraumatic.  Nose: Nose normal.  Eyes: Right eye exhibits no discharge. Left eye exhibits no discharge.  Neck: Normal range of motion. Neck supple.  Cardiovascular: Normal rate and regular rhythm.   Murmur heard. Pulmonary/Chest: Effort normal and breath sounds normal.  Abdominal: Soft. Bowel sounds are normal. There is no tenderness.  Musculoskeletal: She exhibits no edema.  Neurological: She is alert and oriented to person, place, and time.  Skin: Skin is warm and dry.  Psychiatric: She has a normal mood and affect.  Nursing note and vitals reviewed.   BP 132/72 mmHg  Pulse 85  Temp(Src) 97.8 F (36.6 C) (Oral)  Ht 5\' 3"  (1.6 m)  Wt 268 lb (121.564 kg)  BMI 47.49 kg/m2  SpO2 97% Wt Readings from Last 3 Encounters:  08/13/15 264 lb 6 oz (119.92 kg)  08/05/15 268 lb (121.564 kg)  07/29/15 260 lb 8 oz (118.162 kg)     Lab Results  Component Value Date   WBC 10.3 08/11/2015   HGB 14.0 08/11/2015   HCT 43.3 08/11/2015   PLT 403* 08/11/2015   GLUCOSE 104* 08/11/2015   CHOL 214* 07/28/2015   TRIG 134 07/28/2015   HDL 64 07/28/2015   LDLCALC 123* 07/28/2015    ALT 21 12/02/2012   AST 21 12/02/2012   NA 140 08/11/2015   K 4.0 08/11/2015   CL 102 08/11/2015   CREATININE 0.79 08/11/2015   BUN 14 08/11/2015   CO2 26 08/11/2015   TSH 3.09 12/02/2012    Lab Results  Component Value Date   TSH 3.09 12/02/2012   Lab Results  Component Value Date   WBC 10.3 08/11/2015   HGB 14.0 08/11/2015   HCT 43.3 08/11/2015   MCV 87.5 08/11/2015   PLT 403* 08/11/2015   Lab Results  Component Value Date   NA 140 08/11/2015   K 4.0 08/11/2015   CO2 26 08/11/2015   GLUCOSE 104* 08/11/2015   BUN 14 08/11/2015   CREATININE 0.79 08/11/2015   BILITOT 0.6 12/02/2012   ALKPHOS 93 12/02/2012   AST 21 12/02/2012   ALT 21 12/02/2012   PROT 6.3 12/02/2012   ALBUMIN 3.4* 12/02/2012   ALBUMIN 3.4* 12/02/2012   CALCIUM 9.5 08/11/2015   ANIONGAP 12 08/11/2015   GFR 67.34 12/02/2012   Lab Results  Component Value Date   CHOL 214* 07/28/2015   Lab Results  Component Value Date   HDL 64 07/28/2015   Lab Results  Component Value Date   LDLCALC 123* 07/28/2015   Lab Results  Component Value Date   TRIG 134 07/28/2015   Lab Results  Component Value Date   CHOLHDL 3.3 07/28/2015   No results found for: HGBA1C     Assessment & Plan:   Problem List Items Addressed This Visit    Chest pain at rest - Primary    Atypical sometimes achy sometimes burning. Off and on for the past 1-weeks. No SOB/nausea, or diaphoresis. Does note a skipped beat. Had a work up in hospital to rule out cardiac cause. Has cardiology follow up and will seek care if symptoms worsen      Essential hypertension    Well controlled, no changes to meds. Encouraged heart healthy diet such as the DASH diet and  exercise as tolerated.       Relevant Medications   nitroGLYCERIN (NITROSTAT) 0.4 MG SL tablet   GERD (gastroesophageal reflux disease)    Possible cause of atypical CP. Avoid offending foods, start probiotics. Do not eat large meals in late evening and consider raising  head of bed. Given Hyoscyamine to try prn. Protonix and pepcid and referred to gastroenterology      Relevant Medications   hyoscyamine (LEVSIN SL) 0.125 MG SL tablet   Hyperlipidemia    Tolerating statin, encouraged heart healthy diet, avoid trans fats, minimize simple carbs and saturated fats. Increase exercise as tolerated      Relevant Medications   nitroGLYCERIN (NITROSTAT) 0.4 MG SL tablet   Hypothyroidism    On Levothyroxine, continue to monitor      Insomnia    Encouraged good sleep hygiene such as dark, quiet room. No blue/green glowing lights such as computer screens in bedroom. No alcohol or stimulants in evening. Cut down on caffeine as able. Regular exercise is helpful but not just prior to bed time.       Obesity (BMI 30-39.9)    Encouraged DASH diet, decrease po intake and increase exercise as tolerated. Needs 7-8 hours of sleep nightly. Avoid trans fats, eat small, frequent meals every 4-5 hours with lean proteins, complex carbs and healthy fats. Minimize simple carbs         I have discontinued Ms. Nohr's meloxicam. I am also having her start on zolpidem, nitroGLYCERIN, and hyoscyamine. Additionally, I am having her maintain her furosemide, potassium chloride SA, fluticasone, simvastatin, PROAIR HFA, famotidine, cetirizine, rOPINIRole, metoprolol tartrate, buPROPion, levothyroxine, CVS D3, naltrexone, aspirin EC, hydrochlorothiazide, and pantoprazole.  Meds ordered this encounter  Medications  . zolpidem (AMBIEN) 10 MG tablet    Sig: Take 1 tablet (10 mg total) by mouth at bedtime as needed for sleep.    Dispense:  30 tablet    Refill:  1  . nitroGLYCERIN (NITROSTAT) 0.4 MG SL tablet    Sig: Place 1 tablet (0.4 mg total) under the tongue every 5 (five) minutes as needed for chest pain.    Dispense:  25 tablet    Refill:  3  . hyoscyamine (LEVSIN SL) 0.125 MG SL tablet    Sig: Place 1 tablet (0.125 mg total) under the tongue every 4 (four) hours as needed.     Dispense:  30 tablet    Refill:  0     Penni Homans, MD

## 2015-08-11 ENCOUNTER — Other Ambulatory Visit: Payer: Medicare Other

## 2015-08-11 ENCOUNTER — Emergency Department (HOSPITAL_COMMUNITY)
Admission: EM | Admit: 2015-08-11 | Discharge: 2015-08-11 | Disposition: A | Discharge status: Home / Self Care | Payer: Medicare Other | Attending: Emergency Medicine | Admitting: Emergency Medicine

## 2015-08-11 ENCOUNTER — Encounter (HOSPITAL_COMMUNITY): Payer: Self-pay | Admitting: Emergency Medicine

## 2015-08-11 ENCOUNTER — Telehealth: Payer: Self-pay | Admitting: Family Medicine

## 2015-08-11 DIAGNOSIS — Z8619 Personal history of other infectious and parasitic diseases: Secondary | ICD-10-CM | POA: Insufficient documentation

## 2015-08-11 DIAGNOSIS — K219 Gastro-esophageal reflux disease without esophagitis: Secondary | ICD-10-CM | POA: Diagnosis not present

## 2015-08-11 DIAGNOSIS — I10 Essential (primary) hypertension: Secondary | ICD-10-CM | POA: Insufficient documentation

## 2015-08-11 DIAGNOSIS — R110 Nausea: Secondary | ICD-10-CM | POA: Insufficient documentation

## 2015-08-11 DIAGNOSIS — E039 Hypothyroidism, unspecified: Secondary | ICD-10-CM | POA: Insufficient documentation

## 2015-08-11 DIAGNOSIS — Z87891 Personal history of nicotine dependence: Secondary | ICD-10-CM | POA: Diagnosis not present

## 2015-08-11 DIAGNOSIS — M069 Rheumatoid arthritis, unspecified: Secondary | ICD-10-CM | POA: Insufficient documentation

## 2015-08-11 DIAGNOSIS — Z79899 Other long term (current) drug therapy: Secondary | ICD-10-CM | POA: Insufficient documentation

## 2015-08-11 DIAGNOSIS — Z8701 Personal history of pneumonia (recurrent): Secondary | ICD-10-CM | POA: Diagnosis not present

## 2015-08-11 DIAGNOSIS — G2581 Restless legs syndrome: Secondary | ICD-10-CM | POA: Insufficient documentation

## 2015-08-11 DIAGNOSIS — E785 Hyperlipidemia, unspecified: Secondary | ICD-10-CM | POA: Diagnosis not present

## 2015-08-11 DIAGNOSIS — Z7951 Long term (current) use of inhaled steroids: Secondary | ICD-10-CM | POA: Diagnosis not present

## 2015-08-11 DIAGNOSIS — R1013 Epigastric pain: Secondary | ICD-10-CM | POA: Diagnosis not present

## 2015-08-11 DIAGNOSIS — Z7952 Long term (current) use of systemic steroids: Secondary | ICD-10-CM | POA: Diagnosis not present

## 2015-08-11 DIAGNOSIS — E669 Obesity, unspecified: Secondary | ICD-10-CM | POA: Diagnosis not present

## 2015-08-11 DIAGNOSIS — G4700 Insomnia, unspecified: Secondary | ICD-10-CM | POA: Insufficient documentation

## 2015-08-11 DIAGNOSIS — R079 Chest pain, unspecified: Secondary | ICD-10-CM | POA: Insufficient documentation

## 2015-08-11 LAB — CBC
HCT: 43.3 % (ref 36.0–46.0)
HEMOGLOBIN: 14 g/dL (ref 12.0–15.0)
MCH: 28.3 pg (ref 26.0–34.0)
MCHC: 32.3 g/dL (ref 30.0–36.0)
MCV: 87.5 fL (ref 78.0–100.0)
Platelets: 403 10*3/uL — ABNORMAL HIGH (ref 150–400)
RBC: 4.95 MIL/uL (ref 3.87–5.11)
RDW: 14.6 % (ref 11.5–15.5)
WBC: 10.3 10*3/uL (ref 4.0–10.5)

## 2015-08-11 LAB — BASIC METABOLIC PANEL
Anion gap: 12 (ref 5–15)
BUN: 14 mg/dL (ref 6–20)
CALCIUM: 9.5 mg/dL (ref 8.9–10.3)
CHLORIDE: 102 mmol/L (ref 101–111)
CO2: 26 mmol/L (ref 22–32)
CREATININE: 0.79 mg/dL (ref 0.44–1.00)
GFR calc non Af Amer: >60 mL/min (ref >60)
Glucose, Bld: 104 mg/dL — ABNORMAL HIGH (ref 65–99)
POTASSIUM: 4 mmol/L (ref 3.5–5.1)
Sodium: 140 mmol/L (ref 135–145)

## 2015-08-11 LAB — I-STAT TROPONIN, ED: TROPONIN I, POC: 0 ng/mL (ref 0.00–0.08)

## 2015-08-11 MED ORDER — NITROGLYCERIN 0.4 MG SL SUBL: 0.4000 mg | SUBLINGUAL_TABLET | SUBLINGUAL | Status: DC | PRN

## 2015-08-11 MED ORDER — TRAMADOL HCL 50 MG PO TABS
50.0000 mg | ORAL_TABLET | Freq: Once | ORAL | Status: AC
  Administered 2015-08-11: 50 mg via ORAL
  Filled 2015-08-11: qty 1

## 2015-08-11 MED ORDER — FAMOTIDINE 20 MG PO TABS
20.0000 mg | ORAL_TABLET | Freq: Once | ORAL | Status: AC
  Administered 2015-08-11: 20 mg via ORAL
  Filled 2015-08-11: qty 1

## 2015-08-11 NOTE — Telephone Encounter (Signed)
Patient Name: Rebecca Brooks DOB: February 05, 1951 Initial Comment Caller states she was in hospital on Jan4-5 with chest pain, having chest pain again and feels like she can't swallow, office provided info but call dropped during xfer, called patient number and no answer Nurse Assessment Nurse: Vallery Sa, RN, [place] Date/Time ([place] Time): 08/11/2015 8:40:03 AM Confirm and document reason for call. If symptomatic, describe symptoms. You must click the next button to save text entered. ---Caller states she developed chest pain again 1-2 days ago. No severe breathing difficulty. Alert and responsive. No drooling. Has the patient traveled out of the country within the last 30 days? ---No Does the patient have any new or worsening symptoms? ---Yes Will a triage be completed? ---Yes Related visit to physician within the last 2 weeks? ---Yes Does the PT have any chronic conditions? (i.e. diabetes, asthma, etc.) ---Yes List chronic conditions. ---High Blood Pressure and Cholesterol, Arthritis Is this a behavioral health or substance abuse call? ---No Guidelines Guideline Title Affirmed Question Affirmed Notes Chest Pain [1] Chest pain lasts > 5 minutes AND [2] described as crushing, pressure-like, or heavy Final Disposition User Call EMS 911 Now [place], RN, [place] Comments pt. called back. transferred call to nurse Caller declined the Call 911 disposition and plans to have her husband take her to ER now. Referrals [facility] - ED [facility] - ED Disagree/Comply: Disagree Disagree/Comply Reason: Disagree with instructions

## 2015-08-11 NOTE — ED Notes (Signed)
Patient states chest pain x 2 days.  Patient states "I really think it's my hiatal hernia, I'm scheduled for a stress test soon".   Patient states is a little nauseated and has some loose stools.   Denies other symptoms.

## 2015-08-11 NOTE — ED Provider Notes (Signed)
CSN: [id number]     Arrival date & time 08/11/15  1000 History   First MD Initiated Contact with Patient 08/11/15 1017     Chief Complaint  Patient presents with  . Chest Pain     (Consider location/radiation/quality/duration/timing/severity/associated sxs/prior Treatment) HPI Comments: 65 year-old female with history of obesity, hiatal hernia, reflux, asthma, rheumatoid arthritis presents with fairly constant chest pressure, nausea and more difficulty tolerating oral fluids. Patient is been on liquid diet prior to the symptoms however she feels reflux-like symptoms after drinking. She had a recent cardiac workup for similar which is overall unremarkable. This is the same symptoms she had last visit. No heart attack or blood clot history no recent surgeries, no unilateral swelling. Symptoms mild currently and associated with GI intake. Patient had a scope 20 years ago.  Patient is a 65 y.o. female presenting with chest pain. The history is provided by the patient.  Chest Pain Associated symptoms: nausea   Associated symptoms: no abdominal pain, no back pain, no fever, no headache, no shortness of breath and not vomiting     Past Medical History  Diagnosis Date  . Pneumonia     this October from which she has said inhailer  . Obesity   . Chicken pox as a child  . Measles as a child  . Mumps as a child  . Arthritis   . GERD (gastroesophageal reflux disease)   . Hyperlipidemia   . Hypertension   . Hypothyroidism   . Low back pain radiating to right leg     intermittent and occasional numbness of skin over right hip in certain positions  . Recurrent epistaxis 02/20/2012  . Essential hypertension 07/17/2011    ACEI d/c 06/2011 for refractory cough> resolved   . RLS (restless legs syndrome) 11/01/2014  . Rheumatoid arthritis ([place]) 09/10/2011    Bone on bone in knees Pain in L>R knees.   . Insomnia 11/01/2014   Past Surgical History  Procedure Laterality Date  . Thyroidectomy     total for benign tumor, Parathyroid spared  . Tonsillectomy    . Cesarean section      X 3  . Joint replacement  2006    right hip replacement, secondary to congenital  hip defect  . Hernia repair  65 yrs old    right inguinal   Family History  Problem Relation Age of Onset  . Cancer Mother 54    lung/ smoker  . Heart disease Mother   . Hearing loss Mother     tachycardia  . Mental illness Mother     anxiety, claustrophobia  . Osteoarthritis Father   . Hyperlipidemia Father   . Hyperlipidemia Sister   . Migraines Sister   . Hypertension Sister   . Cancer Maternal Grandmother     colon  . Heart attack Maternal Grandfather   . Aneurysm Paternal Grandfather     abdominal  . Heart disease Paternal Grandfather     AAA rupture, smoker   Social History  Substance Use Topics  . Smoking status: Former Smoker -- 0.50 packs/day for 5 years    Types: Cigarettes    Quit date: 07/25/1971  . Smokeless tobacco: Never Used  . Alcohol Use: 8.4 oz/week    14 Glasses of wine per week     Comment: socially   OB History    No data available     Review of Systems  Constitutional: Negative for fever and chills.  HENT: Negative for congestion.  Eyes: Negative for visual disturbance.  Respiratory: Negative for shortness of breath.   Cardiovascular: Positive for chest pain (nonexertional).  Gastrointestinal: Positive for nausea. Negative for vomiting and abdominal pain.  Genitourinary: Negative for dysuria and flank pain.  Musculoskeletal: Negative for back pain, neck pain and neck stiffness.  Skin: Negative for rash.  Neurological: Negative for light-headedness and headaches.      Allergies  Accupril  Home Medications   Prior to Admission medications   Medication Sig Start Date End Date Taking? Authorizing Provider  aspirin EC 81 MG tablet Take 1 tablet (81 mg total) by mouth daily. 07/29/15  Yes Silver Huguenin Elgergawy, MD  buPROPion (WELLBUTRIN XL) 150 MG 24 hr tablet Take 150 mg  by mouth daily. 06/24/15  Yes Historical Provider, MD  cetirizine (ZYRTEC) 10 MG tablet Take 1 tablet (10 mg total) by mouth daily. 05/25/15  Yes Mosie Lukes, MD  [employer] D3 1000 UNITS capsule TAKE ONE CAPSULE BY MOUTH EVERY DAY 07/20/15  Yes Mosie Lukes, MD  famotidine (PEPCID) 20 MG tablet TAKE 1 TABLET BY MOUTH AT BEDTIME AS NEEDED FOR HEARTBURN OR INDIGESTION 04/29/15  Yes Mosie Lukes, MD  furosemide (LASIX) 20 MG tablet Take 1 tablet (20 mg total) by mouth daily as needed. 1 tablet daily as needed 09/03/12  Yes Mosie Lukes, MD  hydrochlorothiazide (HYDRODIURIL) 25 MG tablet TAKE 1 TABLET BY MOUTH DAILY 08/04/15  Yes Mosie Lukes, MD  hyoscyamine (LEVSIN SL) 0.125 MG SL tablet Place 1 tablet (0.125 mg total) under the tongue every 4 (four) hours as needed. 08/05/15  Yes Mosie Lukes, MD  levothyroxine (SYNTHROID, LEVOTHROID) 100 MCG tablet Take 1 tablet (100 mcg total) by mouth daily before breakfast. 07/06/15  Yes Mosie Lukes, MD  MELATONIN PO Take 1 tablet by mouth at bedtime as needed (sleep).   Yes Historical Provider, MD  metoprolol tartrate (LOPRESSOR) 25 MG tablet Take 1 tablet (25 mg total) by mouth 2 (two) times daily. 06/28/15  Yes Mosie Lukes, MD  naltrexone (DEPADE) 50 MG tablet Take 25 mg by mouth daily. 06/24/15  Yes Historical Provider, MD  pantoprazole (PROTONIX) 40 MG tablet TAKE 1 TABLET BY MOUTH DAILY 08/04/15  Yes Mosie Lukes, MD  simvastatin (ZOCOR) 40 MG tablet TAKE 1 TABLET BY MOUTH EVERY EVENING 11/17/14  Yes Mosie Lukes, MD  cyclobenzaprine (FLEXERIL) 5 MG tablet Take 1 tablet (5 mg total) by mouth at bedtime as needed for muscle spasms. Patient not taking: Reported on 08/11/2015 01/12/14   Debbrah Alar, NP  fluticasone [facility]) 50 MCG/ACT nasal spray Place 2 sprays into both nostrils daily. Patient not taking: Reported on 08/11/2015 11/03/13   Debbrah Alar, NP  nitroGLYCERIN (NITROSTAT) 0.4 MG SL tablet Place 1 tablet (0.4 mg total) under the  tongue every 5 (five) minutes as needed for chest pain. 08/05/15   Mosie Lukes, MD  potassium chloride SA (K-DUR,KLOR-CON) 20 MEQ tablet Take 1 tablet (20 mEq total) by mouth daily as needed (lasix use). Patient not taking: Reported on 08/11/2015 09/03/12   Mosie Lukes, MD  PROAIR HFA 108 [id number] BASE) MCG/ACT inhaler INHALE 2 PUFFS INTO THE LUNGS EVERY 6 HOURS AS NEEDED FOR WHEEZING/SHORTNESS OF BREATH 11/24/14   Mosie Lukes, MD  rOPINIRole (REQUIP) 0.5 MG tablet Take 1-2 tablets (0.5-1 mg total) by mouth at bedtime as needed. Patient not taking: Reported on 08/11/2015 06/28/15   Mosie Lukes, MD  zolpidem (AMBIEN) 10 MG tablet  Take 1 tablet (10 mg total) by mouth at bedtime as needed for sleep. Patient not taking: Reported on 08/11/2015 08/05/15 09/04/15  Mosie Lukes, MD   BP 114/55 mmHg  Pulse 73  Temp(Src) 97.6 F (36.4 C) (Oral)  Resp 20  SpO2 98% Physical Exam  Constitutional: She is oriented to person, place, and time. She appears well-developed and well-nourished.  HENT:  Head: Normocephalic and atraumatic.  Eyes: Conjunctivae are normal. Right eye exhibits no discharge. Left eye exhibits no discharge.  Neck: Normal range of motion. Neck supple. No tracheal deviation present.  Cardiovascular: Normal rate and regular rhythm.   Pulmonary/Chest: Effort normal and breath sounds normal.  Abdominal: Soft. She exhibits no distension. There is tenderness (mild discomfort with epigastric pressure). There is no guarding.  Musculoskeletal: She exhibits no edema.  Neurological: She is alert and oriented to person, place, and time.  Skin: Skin is warm. No rash noted.  Psychiatric: She has a normal mood and affect.  Nursing note and vitals reviewed.   ED Course  Procedures (including critical care time) Labs Review Labs Reviewed  BASIC METABOLIC PANEL - Abnormal; Notable for the following:    Glucose, Bld 104 (*)    All other components within normal limits  CBC - Abnormal; Notable  for the following:    Platelets 403 (*)    All other components within normal limits  I-STAT TROPOININ, ED    Imaging Review No results found. I have personally reviewed and evaluated these images and lab results as part of my medical decision-making.   EKG Interpretation   Date/Time:  Wednesday August 11 2015 10:07:27 EST Ventricular Rate:  75 PR Interval:  142 QRS Duration: 90 QT Interval:  384 QTC Calculation: 428 R Axis:   -51 Text Interpretation:  Normal sinus rhythm Left axis deviation Abnormal ECG  Confirmed by Lyan Moyano  MD, Demetries Coia ([id number]) on 08/11/2015 10:57:04 AM      MDM   Final diagnoses:  Chest pain, unspecified chest pain type   Patient presents with atypical chest pain most likely due to GI source. With age and risk factors cardiac screen completed troponin negative EKG no changes. Paged gastroenterology for close outpatient follow-up and possible EGD. Discussed reasons to return patient comfortable this plan. No indication for emergent admission at this time.  Results and differential diagnosis were discussed with the patient/parent/guardian. Xrays were independently reviewed by myself.  Close follow up outpatient was discussed, comfortable with the plan.   Medications  nitroGLYCERIN (NITROSTAT) SL tablet 0.4 mg (not administered)  famotidine (PEPCID) tablet 20 mg (20 mg Oral Given 08/11/15 1109)  traMADol (ULTRAM) tablet 50 mg (50 mg Oral Given 08/11/15 1109)    Filed Vitals:   08/11/15 1010 08/11/15 1034 08/11/15 1045  BP: 145/78 124/69 114/55  Pulse: 82 99 73  Temp: 97.6 F (36.4 C)    TempSrc: Oral    Resp: 20 20 20   SpO2: 100% 100% 98%    Final diagnoses:  Chest pain, unspecified chest pain type        Elnora Morrison, MD 08/11/15 1144

## 2015-08-11 NOTE — Discharge Instructions (Signed)
You have an appointment with the gastroenterologist Rebecca Brooks on January 20 at 1:30 PM. Please call day prior to confirm appointment. Third floor.   If you were given medicines take as directed.  If you are on coumadin or contraceptives realize their levels and effectiveness is altered by many different medicines.  If you have any reaction (rash, tongues swelling, other) to the medicines stop taking and see a physician.    If your blood pressure was elevated in the ER make sure you follow up for management with a primary doctor or return for chest pain, shortness of breath or stroke symptoms.  Please follow up as directed and return to the ER or see a physician for new or worsening symptoms.  Thank you. Filed Vitals:   08/11/15 1010 08/11/15 1034 08/11/15 1045  BP: 145/78 124/69 114/55  Pulse: 82 99 73  Temp: 97.6 F (36.4 C)    TempSrc: Oral    Resp: 20 20 20   SpO2: 100% 100% 98%

## 2015-08-11 NOTE — Telephone Encounter (Signed)
Patient was disconnected during transfer to [facility]. Transferred back to [facility] @ [phone]

## 2015-08-11 NOTE — Telephone Encounter (Signed)
Pt called stating continued chest pains and trouble swallowing. Transferred to Shanon Brow at [facility].

## 2015-08-13 ENCOUNTER — Ambulatory Visit (INDEPENDENT_AMBULATORY_CARE_PROVIDER_SITE_OTHER): Payer: Medicare Other | Admitting: Gastroenterology

## 2015-08-13 ENCOUNTER — Encounter: Payer: Self-pay | Admitting: Gastroenterology

## 2015-08-13 VITALS — BP 120/76 | HR 76 | Ht 62.0 in | Wt 264.4 lb

## 2015-08-13 DIAGNOSIS — K219 Gastro-esophageal reflux disease without esophagitis: Secondary | ICD-10-CM

## 2015-08-13 DIAGNOSIS — R198 Other specified symptoms and signs involving the digestive system and abdomen: Secondary | ICD-10-CM | POA: Insufficient documentation

## 2015-08-13 DIAGNOSIS — Z1211 Encounter for screening for malignant neoplasm of colon: Secondary | ICD-10-CM | POA: Insufficient documentation

## 2015-08-13 DIAGNOSIS — R0789 Other chest pain: Secondary | ICD-10-CM

## 2015-08-13 DIAGNOSIS — F458 Other somatoform disorders: Secondary | ICD-10-CM | POA: Diagnosis not present

## 2015-08-13 DIAGNOSIS — R0989 Other specified symptoms and signs involving the circulatory and respiratory systems: Secondary | ICD-10-CM | POA: Insufficient documentation

## 2015-08-13 DIAGNOSIS — Z1231 Encounter for screening mammogram for malignant neoplasm of breast: Secondary | ICD-10-CM | POA: Insufficient documentation

## 2015-08-13 NOTE — Progress Notes (Signed)
08/13/2015 ANATASIA SCHIAVI [id number] [birth year]   HISTORY OF PRESENT ILLNESS:  This is a 65 year old female who is here at the request of her PCP, Dr. Charlett Blake, for evaluation of atypical chest pain and other related symptoms. The patient says that she used to see Dr. Sharlett Iles in the 1990s, but then moved down near [place]. They have now been relocated back to this area and are reestablishing care here. Her recent symptoms are chest pain, sensation that there is something constantly in her throat/esophagus, bad breath, and hoarseness. She tells me that she "had her esophagus stretched" in the past at the same time as her last colonoscopy in 2007ish.  We are trying to obtain these records.  She went to the ED for complaints of chest pain and was admitted to the hospital where she underwent extensive cardiac workup, which has been unremarkable. She has been on pantoprazole 40 mg daily for several years, probably since she saw Dr. Sharlett Iles in the 1990s. She also takes Pepcid 20 mg at bedtime. She said that she had been given both nitroglycerin and Levsin, which seemed to help when she gets this chest pain.  CBC, BMP, hepatic function panel, and TSH are normal.  Her last colonoscopy she says was around 2007 or so at a facility near [place].  We are trying to obtain records.  She moves her bowels regularly and denies any blood in her stool.   Past Medical History  Diagnosis Date  . Pneumonia     this October from which she has said inhailer  . Obesity   . Chicken pox as a child  . Measles as a child  . Mumps as a child  . Arthritis   . GERD (gastroesophageal reflux disease)   . Hyperlipidemia   . Hypertension   . Hypothyroidism   . Low back pain radiating to right leg     intermittent and occasional numbness of skin over right hip in certain positions  . Recurrent epistaxis 02/20/2012  . Essential hypertension 07/17/2011    ACEI d/c 06/2011 for refractory cough> resolved   . RLS  (restless legs syndrome) 11/01/2014  . Rheumatoid arthritis ([place]) 09/10/2011    Bone on bone in knees Pain in L>R knees.   . Insomnia 11/01/2014   Past Surgical History  Procedure Laterality Date  . Thyroidectomy      total for benign tumor, Parathyroid spared  . Tonsillectomy    . Cesarean section      X 3  . Total hip arthroplasty Right 2006    secondary to congenital  hip defect  . Inguinal hernia repair Right 65 yrs old    reports that she quit smoking about 44 years ago. Her smoking use included Cigarettes. She has a 2.5 pack-year smoking history. She has never used smokeless tobacco. She reports that she drinks about 8.4 oz of alcohol per week. She reports that she does not use illicit drugs. family history includes Aneurysm in her paternal grandfather; Anuerysm in her father; Anxiety disorder in her mother; Colon cancer in her maternal grandmother; Hearing loss in her mother; Heart Problems in her mother and sister; Heart attack in her maternal grandfather; Heart disease in her paternal grandfather; Hyperlipidemia in her father and sister; Hypertension in her sister; Lung cancer (age of onset: 50) in her mother; Migraines in her sister; Osteoarthritis in her father. Allergies  Allergen Reactions  . Accupril [Quinapril Hcl] Cough      Outpatient Encounter Prescriptions as  of 08/13/2015  Medication Sig  . aspirin EC 81 MG tablet Take 1 tablet (81 mg total) by mouth daily.  Marland Kitchen buPROPion (WELLBUTRIN XL) 150 MG 24 hr tablet Take 150 mg by mouth daily.  . cetirizine (ZYRTEC) 10 MG tablet Take 1 tablet (10 mg total) by mouth daily.  . [employer] D3 1000 UNITS capsule TAKE ONE CAPSULE BY MOUTH EVERY DAY  . famotidine (PEPCID) 20 MG tablet TAKE 1 TABLET BY MOUTH AT BEDTIME AS NEEDED FOR HEARTBURN OR INDIGESTION  . fluticasone (FLONASE) 50 MCG/ACT nasal spray Place 2 sprays into both nostrils daily. (Patient taking differently: Place 2 sprays into both nostrils as needed. )  . hydrochlorothiazide  (HYDRODIURIL) 25 MG tablet TAKE 1 TABLET BY MOUTH DAILY  . hyoscyamine (LEVSIN SL) 0.125 MG SL tablet Place 1 tablet (0.125 mg total) under the tongue every 4 (four) hours as needed.  Marland Kitchen levothyroxine (SYNTHROID, LEVOTHROID) 100 MCG tablet Take 1 tablet (100 mcg total) by mouth daily before breakfast.  . MELATONIN PO Take 1 tablet by mouth at bedtime as needed (sleep).  . metoprolol tartrate (LOPRESSOR) 25 MG tablet Take 1 tablet (25 mg total) by mouth 2 (two) times daily.  . naltrexone (DEPADE) 50 MG tablet Take 25 mg by mouth daily.  . pantoprazole (PROTONIX) 40 MG tablet TAKE 1 TABLET BY MOUTH DAILY  . rOPINIRole (REQUIP) 0.5 MG tablet Take 1-2 tablets (0.5-1 mg total) by mouth at bedtime as needed.  . simvastatin (ZOCOR) 40 MG tablet TAKE 1 TABLET BY MOUTH EVERY EVENING  . zolpidem (AMBIEN) 10 MG tablet Take 1 tablet (10 mg total) by mouth at bedtime as needed for sleep.  . furosemide (LASIX) 20 MG tablet Take 1 tablet (20 mg total) by mouth daily as needed. 1 tablet daily as needed (Patient not taking: Reported on 08/13/2015)  . nitroGLYCERIN (NITROSTAT) 0.4 MG SL tablet Place 1 tablet (0.4 mg total) under the tongue every 5 (five) minutes as needed for chest pain. (Patient not taking: Reported on 08/13/2015)  . potassium chloride SA (K-DUR,KLOR-CON) 20 MEQ tablet Take 1 tablet (20 mEq total) by mouth daily as needed (lasix use). (Patient not taking: Reported on 08/11/2015)  . PROAIR HFA 108 (90 BASE) MCG/ACT inhaler INHALE 2 PUFFS INTO THE LUNGS EVERY 6 HOURS AS NEEDED FOR WHEEZING/SHORTNESS OF BREATH (Patient not taking: Reported on 08/13/2015)  . [DISCONTINUED] cyclobenzaprine (FLEXERIL) 5 MG tablet Take 1 tablet (5 mg total) by mouth at bedtime as needed for muscle spasms. (Patient not taking: Reported on 08/11/2015)   No facility-administered encounter medications on file as of 08/13/2015.     REVIEW OF SYSTEMS  : All other systems reviewed and negative except where noted in the History of  Present Illness.   PHYSICAL EXAM: BP 120/76 mmHg  Pulse 76  Ht 5\' 2"  (1.575 m)  Wt 264 lb 6 oz (119.92 kg)  BMI 48.34 kg/m2 General: Well developed white female in no acute distress Head: Normocephalic and atraumatic Eyes:  Slerae anicteric, conjunctiva pink. Ears: Normal auditory acuity Lungs: Clear throughout to auscultation Heart: Regular rate and rhythm Abdomen: Soft, non-distended.  Normal bowel sounds.  Non-tender. Musculoskeletal: Symmetrical with no gross deformities  Skin: No lesions on visible extremities Extremities: No edema  Neurological: Alert oriented x 4, grossly non-focal Psychological:  Alert and cooperative. Normal mood and affect  ASSESSMENT AND PLAN: -Atypical chest pain, globus sensation, bad breath, hoarseness:  Cardiac evaluation negative at this point.  Likely all related to GERD, ? Esophageal spasm  since it is relieved by levsin and/or nitroglycerin.  Apparently had esophageal dilations in the past at [facility] and we are trying to obtain those records (last was at least 9 years ago).  Will perform EGD with possible dilation.  The risks, benefits, and alternatives to EGD were discussed with the patient and she consents to proceed.  Will continue pantoprazole 40 mg daily and pepcid 20 mg at bedtime for now along with levsin prn. -Screening colonoscopy:  Last was about 10 years or so ago, which she reports was normal but we will try to obtain records.  Patient prefers Cologuard over colonoscopy for now.   CC:  Mosie Lukes, MD

## 2015-08-13 NOTE — Patient Instructions (Signed)
We have sent your demographic and insurance information to [employer]. They should contact you within the next week regarding your Cologuard (colon cancer screening) test. If you have not heard from them within the next week, please call our office at [phone].  You have been scheduled for an endoscopy. Please follow written instructions given to you at your visit today. If you use inhalers (even only as needed), please bring them with you on the day of your procedure. Your physician has requested that you go to www.startemmi.com and enter the access code given to you at your visit today. This web site gives a general overview about your procedure. However, you should still follow specific instructions given to you by our office regarding your preparation for the procedure.

## 2015-08-14 NOTE — Progress Notes (Signed)
Reviewed and agree with documentation and assessment and plan. K. Veena Nandigam , MD   

## 2015-08-15 NOTE — Assessment & Plan Note (Signed)
Tolerating statin, encouraged heart healthy diet, avoid trans fats, minimize simple carbs and saturated fats. Increase exercise as tolerated 

## 2015-08-15 NOTE — Assessment & Plan Note (Signed)
On Levothyroxine, continue to monitor 

## 2015-08-15 NOTE — Assessment & Plan Note (Signed)
Well controlled, no changes to meds. Encouraged heart healthy diet such as the DASH diet and exercise as tolerated.  °

## 2015-08-15 NOTE — Assessment & Plan Note (Signed)
Encouraged DASH diet, decrease po intake and increase exercise as tolerated. Needs 7-8 hours of sleep nightly. Avoid trans fats, eat small, frequent meals every 4-5 hours with lean proteins, complex carbs and healthy fats. Minimize simple carbs 

## 2015-08-15 NOTE — Assessment & Plan Note (Signed)
Encouraged good sleep hygiene such as dark, quiet room. No blue/green glowing lights such as computer screens in bedroom. No alcohol or stimulants in evening. Cut down on caffeine as able. Regular exercise is helpful but not just prior to bed time.  

## 2015-08-15 NOTE — Assessment & Plan Note (Addendum)
Possible cause of atypical CP. Avoid offending foods, start probiotics. Do not eat large meals in late evening and consider raising head of bed. Given Hyoscyamine to try prn. Protonix and pepcid and referred to gastroenterology

## 2015-08-18 ENCOUNTER — Ambulatory Visit
Admission: RE | Admit: 2015-08-18 | Discharge: 2015-08-18 | Disposition: A | Discharge status: Home / Self Care | Payer: Medicare Other | Source: Ambulatory Visit | Attending: Family Medicine | Admitting: Family Medicine

## 2015-08-18 ENCOUNTER — Ambulatory Visit (HOSPITAL_COMMUNITY): Payer: Medicare Other | Attending: Internal Medicine

## 2015-08-18 ENCOUNTER — Ambulatory Visit (HOSPITAL_BASED_OUTPATIENT_CLINIC_OR_DEPARTMENT_OTHER): Payer: Medicare Other

## 2015-08-18 DIAGNOSIS — R0989 Other specified symptoms and signs involving the circulatory and respiratory systems: Secondary | ICD-10-CM

## 2015-08-18 DIAGNOSIS — R079 Chest pain, unspecified: Secondary | ICD-10-CM | POA: Insufficient documentation

## 2015-08-18 DIAGNOSIS — Z780 Asymptomatic menopausal state: Secondary | ICD-10-CM

## 2015-08-18 DIAGNOSIS — Z9289 Personal history of other medical treatment: Secondary | ICD-10-CM | POA: Diagnosis not present

## 2015-08-18 DIAGNOSIS — I10 Essential (primary) hypertension: Secondary | ICD-10-CM | POA: Insufficient documentation

## 2015-08-18 MED ORDER — DOBUTAMINE INFUSION FOR EP/ECHO/NUC (1000 MCG/ML)
20.0000 ug/kg/min | INTRAVENOUS | Status: DC
  Administered 2015-08-18: 20 ug/kg/min via INTRAVENOUS

## 2015-08-18 MED ORDER — PERFLUTREN LIPID MICROSPHERE
1.0000 mL | INTRAVENOUS | Status: AC | PRN
  Administered 2015-08-18: 5 mL via INTRAVENOUS

## 2015-08-19 ENCOUNTER — Ambulatory Visit (INDEPENDENT_AMBULATORY_CARE_PROVIDER_SITE_OTHER): Payer: Medicare Other | Admitting: Cardiology

## 2015-08-19 ENCOUNTER — Encounter: Payer: Self-pay | Admitting: Cardiology

## 2015-08-19 ENCOUNTER — Telehealth: Payer: Self-pay | Admitting: Gastroenterology

## 2015-08-19 ENCOUNTER — Encounter: Payer: Self-pay | Admitting: Cardiovascular Disease

## 2015-08-19 VITALS — BP 150/84 | HR 91 | Ht 62.0 in | Wt 264.0 lb

## 2015-08-19 DIAGNOSIS — M069 Rheumatoid arthritis, unspecified: Secondary | ICD-10-CM

## 2015-08-19 DIAGNOSIS — R079 Chest pain, unspecified: Secondary | ICD-10-CM | POA: Diagnosis not present

## 2015-08-19 DIAGNOSIS — E785 Hyperlipidemia, unspecified: Secondary | ICD-10-CM

## 2015-08-19 DIAGNOSIS — I10 Essential (primary) hypertension: Secondary | ICD-10-CM | POA: Diagnosis not present

## 2015-08-19 DIAGNOSIS — Z79899 Other long term (current) drug therapy: Secondary | ICD-10-CM | POA: Diagnosis not present

## 2015-08-19 LAB — ECHOCARDIOGRAM STRESS TEST
CHL CUP STRESS STAGE 1 SPEED: 0 mph
CHL CUP STRESS STAGE 2 GRADE: 0 %
CHL CUP STRESS STAGE 2 HR: 102 {beats}/min
CHL CUP STRESS STAGE 3 SPEED: 0 mph
CHL CUP STRESS STAGE 4 DBP: 65 mmHg
CHL CUP STRESS STAGE 4 GRADE: 10 %
CHL CUP STRESS STAGE 4 HR: 94 {beats}/min
CHL CUP STRESS STAGE 4 SBP: 121 mmHg
CHL CUP STRESS STAGE 4 SPEED: 0 mph
CHL CUP STRESS STAGE 5 DBP: 62 mmHg
CHL CUP STRESS STAGE 5 HR: 133 {beats}/min
CHL CUP STRESS STAGE 5 SBP: 103 mmHg
CHL CUP STRESS STAGE 6 DBP: 56 mmHg
CHL CUP STRESS STAGE 6 HR: 153 {beats}/min
CHL CUP STRESS STAGE 6 SBP: 84 mmHg
CHL CUP STRESS STAGE 7 DBP: 54 mmHg
CHL CUP STRESS STAGE 7 SBP: 108 mmHg
CHL CUP STRESS STAGE 7 SPEED: 0 mph
CHL CUP STRESS STAGE 8 DBP: 57 mmHg
CHL CUP STRESS STAGE 8 SBP: 113 mmHg
CHL CUP STRESS STAGE 8 SPEED: 0 mph
CSEPPMHR: 98 %
Estimated workload: 1 METS
Peak BP: 84 mmHg
Peak HR: 153 {beats}/min
Stage 1 DBP: 90 mmHg
Stage 1 Grade: 0 %
Stage 1 HR: 121 {beats}/min
Stage 1 SBP: 136 mmHg
Stage 2 DBP: 90 mmHg
Stage 2 SBP: 140 mmHg
Stage 2 Speed: 0 mph
Stage 3 Grade: 0 %
Stage 3 HR: 103 {beats}/min
Stage 5 Grade: 12 %
Stage 5 Speed: 0 mph
Stage 6 Grade: 14 %
Stage 6 Speed: 0 mph
Stage 7 Grade: 0 %
Stage 7 HR: 151 {beats}/min
Stage 8 Grade: 0 %
Stage 8 HR: 100 {beats}/min

## 2015-08-19 LAB — COMPREHENSIVE METABOLIC PANEL
ALT: 13 U/L (ref 6–29)
AST: 14 U/L (ref 10–35)
Albumin: 3.7 g/dL (ref 3.6–5.1)
Alkaline Phosphatase: 92 U/L (ref 33–130)
BUN: 18 mg/dL (ref 7–25)
CO2: 27 mmol/L (ref 20–31)
Calcium: 8.9 mg/dL (ref 8.6–10.4)
Chloride: 102 mmol/L (ref 98–110)
Creat: 0.8 mg/dL (ref 0.50–0.99)
Glucose, Bld: 85 mg/dL (ref 65–99)
Potassium: 4.2 mmol/L (ref 3.5–5.3)
Sodium: 140 mmol/L (ref 135–146)
Total Bilirubin: 0.4 mg/dL (ref 0.2–1.2)
Total Protein: 6.2 g/dL (ref 6.1–8.1)

## 2015-08-19 LAB — CBC
HCT: 40 % (ref 36.0–46.0)
Hemoglobin: 13.3 g/dL (ref 12.0–15.0)
MCH: 28.7 pg (ref 26.0–34.0)
MCHC: 33.3 g/dL (ref 30.0–36.0)
MCV: 86.2 fL (ref 78.0–100.0)
MPV: 10.3 fL (ref 8.6–12.4)
Platelets: 394 10*3/uL (ref 150–400)
RBC: 4.64 MIL/uL (ref 3.87–5.11)
RDW: 14.8 % (ref 11.5–15.5)
WBC: 9.2 10*3/uL (ref 4.0–10.5)

## 2015-08-19 NOTE — Assessment & Plan Note (Addendum)
Indeterminate Myoview and negative stress echo images, though the pt had typical angina with stress.

## 2015-08-19 NOTE — Assessment & Plan Note (Signed)
On statin Rx 

## 2015-08-19 NOTE — Assessment & Plan Note (Signed)
With prior esophageal stricture

## 2015-08-19 NOTE — Assessment & Plan Note (Signed)
BMI 48 

## 2015-08-19 NOTE — Assessment & Plan Note (Signed)
ACEI d/c 06/2011 for refractory cough> resolved

## 2015-08-19 NOTE — Progress Notes (Signed)
08/19/2015 Rebecca Brooks   01-17-1951  [id number]  Primary Physician Penni Homans, MD Primary Cardiologist: Dr Percival Spanish  HPI:  Pleasant 65 y/o obese Caucasian female with a history of HTN and HLD, her husband is a pt of Dr Antionette Char. She presented to the ED 07/27/15 with SSCP worrisome for angina. She ruled out for an MI. Myoview done 07/29/15 was read as "intermediate". Dr Percival Spanish felt the results may have been affected by her body habitus (5'2"- 264 lbs). He suggested a stress echo as an OP. This was done 08/18/15. She was unable to walk on the treadmill secondary to knee pain. With Dobutamine she had typical angina with SSCP that radiated to her back between her shoulders, and her throat. Stress echo images were normal. Thje pt admits she has had stress associated SSCP' Pressure" since the first of the year. It's worse with emotional stress and physical stress. She has taken NTG twice with relief of symptoms.    Current Outpatient Prescriptions  Medication Sig Dispense Refill  . aspirin EC 81 MG tablet Take 1 tablet (81 mg total) by mouth daily.    . cetirizine (ZYRTEC) 10 MG tablet Take 1 tablet (10 mg total) by mouth daily. 30 tablet 10  . [facility] D3 1000 UNITS capsule TAKE ONE CAPSULE BY MOUTH EVERY DAY 90 capsule 1  . famotidine (PEPCID) 20 MG tablet TAKE 1 TABLET BY MOUTH AT BEDTIME AS NEEDED FOR HEARTBURN OR INDIGESTION 90 tablet 1  . fluticasone (FLONASE) 50 MCG/ACT nasal spray Place 2 sprays into both nostrils daily. (Patient taking differently: Place 2 sprays into both nostrils as needed. ) 16 g 2  . furosemide (LASIX) 20 MG tablet Take 1 tablet (20 mg total) by mouth daily as needed. 1 tablet daily as needed 30 tablet 3  . hydrochlorothiazide (HYDRODIURIL) 25 MG tablet TAKE 1 TABLET BY MOUTH DAILY 30 tablet 0  . hyoscyamine (LEVSIN SL) 0.125 MG SL tablet Place 1 tablet (0.125 mg total) under the tongue every 4 (four) hours as needed. 30 tablet 0  . levothyroxine (SYNTHROID, LEVOTHROID)  100 MCG tablet Take 1 tablet (100 mcg total) by mouth daily before breakfast. 90 tablet 1  . MELATONIN PO Take 1 tablet by mouth at bedtime as needed (sleep).    . metoprolol tartrate (LOPRESSOR) 25 MG tablet Take 1 tablet (25 mg total) by mouth 2 (two) times daily. 60 tablet 3  . nitroGLYCERIN (NITROSTAT) 0.4 MG SL tablet Place 1 tablet (0.4 mg total) under the tongue every 5 (five) minutes as needed for chest pain. 25 tablet 3  . pantoprazole (PROTONIX) 40 MG tablet TAKE 1 TABLET BY MOUTH DAILY 30 tablet 0  . potassium chloride SA (K-DUR,KLOR-CON) 20 MEQ tablet Take 1 tablet (20 mEq total) by mouth daily as needed (lasix use). 30 tablet 3  . PROAIR HFA 108 (90 BASE) MCG/ACT inhaler INHALE 2 PUFFS INTO THE LUNGS EVERY 6 HOURS AS NEEDED FOR WHEEZING/SHORTNESS OF BREATH 8.5 Inhaler 0  . rOPINIRole (REQUIP) 0.5 MG tablet Take 1-2 tablets (0.5-1 mg total) by mouth at bedtime as needed. 60 tablet 3  . simvastatin (ZOCOR) 40 MG tablet TAKE 1 TABLET BY MOUTH EVERY EVENING 90 tablet 3  . zolpidem (AMBIEN) 10 MG tablet Take 1 tablet (10 mg total) by mouth at bedtime as needed for sleep. 30 tablet 1  . [DISCONTINUED] budesonide-formoterol (SYMBICORT) 160-4.5 MCG/ACT inhaler Inhale 2 puffs into the lungs 2 (two) times daily as needed. Patient has sample, is not  using 1 Inhaler 0   No current facility-administered medications for this visit.   Facility-Administered Medications Ordered in Other Visits  Medication Dose Route Frequency Provider Last Rate Last Dose  . DOBUTamine (DOBUTREX) 1,000 mcg/mL in dextrose 5% 250 mL infusion  20 mcg/kg/min Intravenous Continuous Minus Breeding, MD 143.9 mL/hr at 08/18/15 0842 20 mcg/kg/min at 08/18/15 0842    Allergies  Allergen Reactions  . Accupril [Quinapril Hcl] Cough    Social History   Social History  . Marital Status: Married    Spouse Name: N/A  . Number of Children: 3  . 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 Smoking status: Former Smoker -- 0.50 packs/day for 5 years    Types: Cigarettes    Quit date: 07/25/1971  . Smokeless tobacco: Never Used  . Alcohol Use: 8.4 oz/week    14 Glasses of wine per week     Comment: socially  . Drug Use: No  . 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 Not on file   Social History Narrative     Review of Systems: General: negative for chills, fever, night sweats or weight changes.  Cardiovascular: negative for chest pain, dyspnea on exertion, edema, orthopnea, palpitations, paroxysmal nocturnal dyspnea or shortness of breath Dermatological: negative for rash Respiratory: negative for cough or wheezing Urologic: negative for hematuria Abdominal: negative for nausea, vomiting, diarrhea, bright red blood per rectum, melena, or hematemesis Neurologic: negative for visual changes, syncope, or dizziness All other systems reviewed and are otherwise negative except as noted above.    Blood pressure 150/84, pulse 91, height 5\' 2"  (1.575 m), weight 264 lb (119.75 kg).  General appearance: alert, cooperative, no distress and morbidly obese Neck: no carotid bruit and no JVD Lungs: clear to auscultation bilaterally Heart: regular rate and rhythm Extremities: no edema Pulses: 2+ and symmetric Skin: Skin color, texture, turgor normal. No rashes or lesions Neurologic: Grossly normal  EKG: 08/12/15 NSR, LAD   ASSESSMENT AND PLAN:   Chest pain with moderate risk of acute coronary syndrome Indeterminate Myoview and negative stress echo images, though the pt had typical angina with stress.  Essential hypertension ACEI d/c 06/2011 for refractory cough> resolved  Morbid obesity (HCC) BMI 48  GERD (gastroesophageal reflux disease) With prior esophageal stricture  Hyperlipidemia On statin Rx  Rheumatoid arthritis Unclear if she actually has Rheumatoid  arthritis she does have bone on bone in her knees Follows with Dr Alben Spittle  I reviewed her case with Dr Debara Pickett in the office today.  We are concerned that she had typical angina with her stress 08/18/15. She has had an abnormal Myoview and technically an abnormal stress echo with stress induced angina though her echo images were normal. She describes NTG responsive chest pain with stress as an OP that sounds typical for angina. We feel its best to proceed with a diagnostic cath. This was discussed with the pt and her husband and they agree.  The patient understands that risks included but are not limited to stroke (1 in 1000), death (1 in 7), kidney failure [usually temporary] (1 in 500), bleeding (1 in 200), allergic reaction [possibly serious] (1 in 200).  The patient understands and agrees to proceed.    Dezerae Freiberger K PA-C 08/19/2015 11:20 AM

## 2015-08-19 NOTE — Patient Instructions (Signed)
Your physician has requested that you have a cardiac catheterization. Cardiac catheterization is used to diagnose and/or treat various heart conditions. Doctors may recommend this procedure for a number of different reasons. The most common reason is to evaluate chest pain. Chest pain can be a symptom of coronary artery disease (CAD), and cardiac catheterization can show whether plaque is narrowing or blocking your heart's arteries. This procedure is also used to evaluate the valves, as well as measure the blood flow and oxygen levels in different parts of your heart. For further information please visit [email or web address]. Please follow instruction sheet, as given.terization  Your physician recommends that you return for lab work in: 1-7 days prior to the cardiac cath

## 2015-08-19 NOTE — Telephone Encounter (Signed)
Patient will call after the cardiac cath results are in and she has clearance. Dr Silverio Decamp approves.

## 2015-08-19 NOTE — Assessment & Plan Note (Signed)
Unclear if she actually has Rheumatoid arthritis she does have bone on bone in her knees Follows with Dr Patrecia Pour

## 2015-08-20 ENCOUNTER — Encounter: Payer: Medicare Other | Admitting: Gastroenterology

## 2015-08-20 ENCOUNTER — Telehealth: Payer: Self-pay | Admitting: Cardiology

## 2015-08-20 LAB — PROTIME-INR
INR: 0.99 (ref <1.50)
Prothrombin Time: 13.2 seconds (ref 11.6–15.2)

## 2015-08-20 LAB — APTT: aPTT: 27 seconds (ref 24–37)

## 2015-08-20 NOTE — Telephone Encounter (Signed)
F/u  Pt retruningn RN phone call- Please call back and discuss.

## 2015-08-23 ENCOUNTER — Encounter (HOSPITAL_COMMUNITY)
Admission: RE | Disposition: A | Discharge status: Home / Self Care | Payer: Self-pay | Source: Ambulatory Visit | Attending: Cardiovascular Disease

## 2015-08-23 ENCOUNTER — Ambulatory Visit (HOSPITAL_COMMUNITY)
Admission: RE | Admit: 2015-08-23 | Discharge: 2015-08-23 | Disposition: A | Discharge status: Home / Self Care | Payer: Medicare Other | Source: Ambulatory Visit | Attending: Cardiovascular Disease | Admitting: Cardiovascular Disease

## 2015-08-23 ENCOUNTER — Encounter (HOSPITAL_COMMUNITY): Payer: Self-pay | Admitting: Cardiovascular Disease

## 2015-08-23 DIAGNOSIS — R079 Chest pain, unspecified: Secondary | ICD-10-CM | POA: Diagnosis not present

## 2015-08-23 DIAGNOSIS — Z6841 Body Mass Index (BMI) 40.0 and over, adult: Secondary | ICD-10-CM | POA: Diagnosis not present

## 2015-08-23 DIAGNOSIS — Z7982 Long term (current) use of aspirin: Secondary | ICD-10-CM | POA: Insufficient documentation

## 2015-08-23 DIAGNOSIS — R931 Abnormal findings on diagnostic imaging of heart and coronary circulation: Secondary | ICD-10-CM

## 2015-08-23 DIAGNOSIS — K219 Gastro-esophageal reflux disease without esophagitis: Secondary | ICD-10-CM | POA: Diagnosis not present

## 2015-08-23 DIAGNOSIS — R9439 Abnormal result of other cardiovascular function study: Secondary | ICD-10-CM | POA: Diagnosis present

## 2015-08-23 DIAGNOSIS — I10 Essential (primary) hypertension: Secondary | ICD-10-CM | POA: Insufficient documentation

## 2015-08-23 DIAGNOSIS — E785 Hyperlipidemia, unspecified: Secondary | ICD-10-CM | POA: Insufficient documentation

## 2015-08-23 DIAGNOSIS — M069 Rheumatoid arthritis, unspecified: Secondary | ICD-10-CM | POA: Insufficient documentation

## 2015-08-23 DIAGNOSIS — Z87891 Personal history of nicotine dependence: Secondary | ICD-10-CM | POA: Insufficient documentation

## 2015-08-23 HISTORY — DX: Abnormal result of other cardiovascular function study: R94.39

## 2015-08-23 HISTORY — PX: CARDIAC CATHETERIZATION: SHX172

## 2015-08-23 SURGERY — LEFT HEART CATH AND CORONARY ANGIOGRAPHY
Anesthesia: LOCAL

## 2015-08-23 MED ORDER — SODIUM CHLORIDE 0.9 % WEIGHT BASED INFUSION
3.0000 mL/kg/h | INTRAVENOUS | Status: AC
  Administered 2015-08-23: 3 mL/kg/h via INTRAVENOUS

## 2015-08-23 MED ORDER — MIDAZOLAM HCL 2 MG/2ML IJ SOLN
INTRAMUSCULAR | Status: AC
  Filled 2015-08-23: qty 2

## 2015-08-23 MED ORDER — VERAPAMIL HCL 2.5 MG/ML IV SOLN
INTRAVENOUS | Status: AC
  Filled 2015-08-23: qty 2

## 2015-08-23 MED ORDER — DIAZEPAM 5 MG PO TABS: 5.0000 mg | ORAL_TABLET | ORAL | Status: DC | PRN

## 2015-08-23 MED ORDER — HEPARIN SODIUM (PORCINE) 1000 UNIT/ML IJ SOLN
INTRAMUSCULAR | Status: AC
  Filled 2015-08-23: qty 1

## 2015-08-23 MED ORDER — HEPARIN (PORCINE) IN NACL 2-0.9 UNIT/ML-% IJ SOLN
INTRAMUSCULAR | Status: AC
  Filled 2015-08-23: qty 1000

## 2015-08-23 MED ORDER — SODIUM CHLORIDE 0.9% FLUSH: 3.0000 mL | INTRAVENOUS | Status: DC | PRN

## 2015-08-23 MED ORDER — MIDAZOLAM HCL 2 MG/2ML IJ SOLN
INTRAMUSCULAR | Status: DC | PRN
  Administered 2015-08-23 (×2): 2 mg via INTRAVENOUS

## 2015-08-23 MED ORDER — ACETAMINOPHEN 325 MG PO TABS: 650.0000 mg | ORAL_TABLET | ORAL | Status: DC | PRN

## 2015-08-23 MED ORDER — SODIUM CHLORIDE 0.9 % WEIGHT BASED INFUSION
1.0000 mL/kg/h | INTRAVENOUS | Status: DC
  Administered 2015-08-23: 1 mL/kg/h via INTRAVENOUS

## 2015-08-23 MED ORDER — LIDOCAINE HCL (PF) 1 % IJ SOLN
INTRAMUSCULAR | Status: DC | PRN
  Administered 2015-08-23: 08:00:00

## 2015-08-23 MED ORDER — IOHEXOL 350 MG/ML SOLN
INTRAVENOUS | Status: DC | PRN
  Administered 2015-08-23: 40 mL via INTRA_ARTERIAL

## 2015-08-23 MED ORDER — SODIUM CHLORIDE 0.9 % IV SOLN: INTRAVENOUS | Status: DC

## 2015-08-23 MED ORDER — FENTANYL CITRATE (PF) 100 MCG/2ML IJ SOLN
INTRAMUSCULAR | Status: AC
  Filled 2015-08-23: qty 2

## 2015-08-23 MED ORDER — ONDANSETRON HCL 4 MG/2ML IJ SOLN: 4.0000 mg | Freq: Four times a day (QID) | INTRAMUSCULAR | Status: DC | PRN

## 2015-08-23 MED ORDER — FENTANYL CITRATE (PF) 100 MCG/2ML IJ SOLN
INTRAMUSCULAR | Status: DC | PRN
  Administered 2015-08-23 (×2): 50 ug via INTRAVENOUS

## 2015-08-23 MED ORDER — LIDOCAINE HCL (PF) 1 % IJ SOLN
INTRAMUSCULAR | Status: AC
  Filled 2015-08-23: qty 30

## 2015-08-23 MED ORDER — SODIUM CHLORIDE 0.9% FLUSH: 3.0000 mL | Freq: Two times a day (BID) | INTRAVENOUS | Status: DC

## 2015-08-23 MED ORDER — SODIUM CHLORIDE 0.9 % IV SOLN: 250.0000 mL | INTRAVENOUS | Status: DC | PRN

## 2015-08-23 MED ORDER — HEPARIN SODIUM (PORCINE) 1000 UNIT/ML IJ SOLN
INTRAMUSCULAR | Status: DC | PRN
  Administered 2015-08-23: 5000 [IU] via INTRAVENOUS

## 2015-08-23 MED ORDER — ASPIRIN 81 MG PO CHEW: 81.0000 mg | CHEWABLE_TABLET | ORAL | Status: DC

## 2015-08-23 MED ORDER — VERAPAMIL HCL 2.5 MG/ML IV SOLN
INTRAVENOUS | Status: DC | PRN
  Administered 2015-08-23: 08:00:00 via INTRA_ARTERIAL

## 2015-08-23 MED ORDER — OXYCODONE-ACETAMINOPHEN 5-325 MG PO TABS: 1.0000 | ORAL_TABLET | ORAL | Status: DC | PRN

## 2015-08-23 SURGICAL SUPPLY — 9 items
CATH IMPULSE 5F ANG/FL3.5 (CATHETERS) ×1 IMPLANT
DEVICE RAD COMP TR BAND LRG (VASCULAR PRODUCTS) ×2 IMPLANT
GLIDESHEATH SLEND SS 6F .021 (SHEATH) ×2 IMPLANT
KIT HEART LEFT (KITS) ×2 IMPLANT
PACK CARDIAC CATHETERIZATION (CUSTOM PROCEDURE TRAY) ×2 IMPLANT
TRANSDUCER W/STOPCOCK (MISCELLANEOUS) ×2 IMPLANT
TUBING CIL FLEX 10 FLL-RA (TUBING) ×2 IMPLANT
WIRE HI TORQ VERSACORE-J 145CM (WIRE) ×1 IMPLANT
WIRE SAFE-T 1.5MM-J .035X260CM (WIRE) ×2 IMPLANT

## 2015-08-23 NOTE — Interval H&P Note (Signed)
Cath Lab Visit (complete for each Cath Lab visit)  Clinical Evaluation Leading to the Procedure:   ACS: No.  Non-ACS:    Anginal Classification: CCS II  Anti-ischemic medical therapy: Minimal Therapy (1 class of medications)  Non-Invasive Test Results: Intermediate-risk stress test findings: cardiac mortality 1-3%/year  Prior CABG: No previous CABG      History and Physical Interval Note:  08/23/2015 7:44 AM  Rebecca Brooks  has presented today for surgery, with the diagnosis of cp  The various methods of treatment have been discussed with the patient and family. After consideration of risks, benefits and other options for treatment, the patient has consented to  Procedure(s): Left Heart Cath and Coronary Angiography (N/A) as a surgical intervention .  The patient's history has been reviewed, patient examined, no change in status, stable for surgery.  I have reviewed the patient's chart and labs.  Questions were answered to the patient's satisfaction.     Rebecca Brooks

## 2015-08-23 NOTE — Discharge Instructions (Signed)
Radial Site Care °Refer to this sheet in the next few weeks. These instructions provide you with information about caring for yourself after your procedure. Your health care provider may also give you more specific instructions. Your treatment has been planned according to current medical practices, but problems sometimes occur. Call your health care provider if you have any problems or questions after your procedure. °WHAT TO EXPECT AFTER THE PROCEDURE °After your procedure, it is typical to have the following: °· Bruising at the radial site that usually fades within 1-2 weeks. °· Blood collecting in the tissue (hematoma) that may be painful to the touch. It should usually decrease in size and tenderness within 1-2 weeks. °HOME CARE INSTRUCTIONS °· Take medicines only as directed by your health care provider. °· You may shower 24-48 hours after the procedure or as directed by your health care provider. Remove the bandage (dressing) and gently wash the site with plain soap and water. Pat the area dry with a clean towel. Do not rub the site, because this may cause bleeding. °· Do not take baths, swim, or use a hot tub until your health care provider approves. °· Check your insertion site every day for redness, swelling, or drainage. °· Do not apply powder or lotion to the site. °· Do not flex or bend the affected arm for 24 hours or as directed by your health care provider. °· Do not push or pull heavy objects with the affected arm for 24 hours or as directed by your health care provider. °· Do not lift over 10 lb (4.5 kg) for 5 days after your procedure or as directed by your health care provider. °· Ask your health care provider when it is okay to: °¨ Return to work or school. °¨ Resume usual physical activities or sports. °¨ Resume sexual activity. °· Do not drive home if you are discharged the same day as the procedure. Have someone else drive you. °· You may drive 24 hours after the procedure unless otherwise  instructed by your health care provider. °· Do not operate machinery or power tools for 24 hours after the procedure. °· If your procedure was done as an outpatient procedure, which means that you went home the same day as your procedure, a responsible adult should be with you for the first 24 hours after you arrive home. °· Keep all follow-up visits as directed by your health care provider. This is important. °SEEK MEDICAL CARE IF: °· You have a fever. °· You have chills. °· You have increased bleeding from the radial site. Hold pressure on the site. °SEEK IMMEDIATE MEDICAL CARE IF: °· You have unusual pain at the radial site. °· You have redness, warmth, or swelling at the radial site. °· You have drainage (other than a small amount of blood on the dressing) from the radial site. °· The radial site is bleeding, and the bleeding does not stop after 30 minutes of holding steady pressure on the site. °· Your arm or hand becomes pale, cool, tingly, or numb. °  °This information is not intended to replace advice given to you by your health care provider. Make sure you discuss any questions you have with your health care provider. °  °Document Released: 08/12/2010 Document Revised: 07/31/2014 Document Reviewed: 01/26/2014 °Elsevier Interactive Patient Education ©2016 [employer] ° °

## 2015-08-23 NOTE — H&P (View-Only) (Signed)
08/19/2015 Rebecca Brooks   [birth year]  [id number]  Primary Physician Penni Homans, MD Primary Cardiologist: Dr Percival Spanish  HPI:  Pleasant 65 y/o obese Caucasian female with a history of HTN and HLD, her husband is a pt of Dr Antionette Char. She presented to the ED 07/27/15 with SSCP worrisome for angina. She ruled out for an MI. Myoview done 07/29/15 was read as "intermediate". Dr Percival Spanish felt the results may have been affected by her body habitus (5'2"- 264 lbs). He suggested a stress echo as an OP. This was done 08/18/15. She was unable to walk on the treadmill secondary to knee pain. With Dobutamine she had typical angina with SSCP that radiated to her back between her shoulders, and her throat. Stress echo images were normal. Thje pt admits she has had stress associated SSCP' Pressure" since the first of the year. It's worse with emotional stress and physical stress. She has taken NTG twice with relief of symptoms.    Current Outpatient Prescriptions  Medication Sig Dispense Refill  . aspirin EC 81 MG tablet Take 1 tablet (81 mg total) by mouth daily.    . cetirizine (ZYRTEC) 10 MG tablet Take 1 tablet (10 mg total) by mouth daily. 30 tablet 10  . [facility]S D3 1000 UNITS capsule TAKE ONE CAPSULE BY MOUTH EVERY DAY 90 capsule 1  . famotidine (PEPCID) 20 MG tablet TAKE 1 TABLET BY MOUTH AT BEDTIME AS NEEDED FOR HEARTBURN OR INDIGESTION 90 tablet 1  . fluticasone (FLONASE) 50 MCG/ACT nasal spray Place 2 sprays into both nostrils daily. (Patient taking differently: Place 2 sprays into both nostrils as needed. ) 16 g 2  . furosemide (LASIX) 20 MG tablet Take 1 tablet (20 mg total) by mouth daily as needed. 1 tablet daily as needed 30 tablet 3  . hydrochlorothiazide (HYDRODIURIL) 25 MG tablet TAKE 1 TABLET BY MOUTH DAILY 30 tablet 0  . hyoscyamine (LEVSIN SL) 0.125 MG SL tablet Place 1 tablet (0.125 mg total) under the tongue every 4 (four) hours as needed. 30 tablet 0  . levothyroxine (SYNTHROID, LEVOTHROID)  100 MCG tablet Take 1 tablet (100 mcg total) by mouth daily before breakfast. 90 tablet 1  . MELATONIN PO Take 1 tablet by mouth at bedtime as needed (sleep).    . metoprolol tartrate (LOPRESSOR) 25 MG tablet Take 1 tablet (25 mg total) by mouth 2 (two) times daily. 60 tablet 3  . nitroGLYCERIN (NITROSTAT) 0.4 MG SL tablet Place 1 tablet (0.4 mg total) under the tongue every 5 (five) minutes as needed for chest pain. 25 tablet 3  . pantoprazole (PROTONIX) 40 MG tablet TAKE 1 TABLET BY MOUTH DAILY 30 tablet 0  . potassium chloride SA (K-DUR,KLOR-CON) 20 MEQ tablet Take 1 tablet (20 mEq total) by mouth daily as needed (lasix use). 30 tablet 3  . PROAIR HFA 108 (90 BASE) MCG/ACT inhaler INHALE 2 PUFFS INTO THE LUNGS EVERY 6 HOURS AS NEEDED FOR WHEEZING/SHORTNESS OF BREATH 8.5 Inhaler 0  . rOPINIRole (REQUIP) 0.5 MG tablet Take 1-2 tablets (0.5-1 mg total) by mouth at bedtime as needed. 60 tablet 3  . simvastatin (ZOCOR) 40 MG tablet TAKE 1 TABLET BY MOUTH EVERY EVENING 90 tablet 3  . zolpidem (AMBIEN) 10 MG tablet Take 1 tablet (10 mg total) by mouth at bedtime as needed for sleep. 30 tablet 1  . [DISCONTINUED] budesonide-formoterol (SYMBICORT) 160-4.5 MCG/ACT inhaler Inhale 2 puffs into the lungs 2 (two) times daily as needed. Patient has sample, is not  using 1 Inhaler 0   No current facility-administered medications for this visit.   Facility-Administered Medications Ordered in Other Visits  Medication Dose Route Frequency Provider Last Rate Last Dose  . DOBUTamine (DOBUTREX) 1,000 mcg/mL in dextrose 5% 250 mL infusion  20 mcg/kg/min Intravenous Continuous Minus Breeding, MD 143.9 mL/hr at 08/18/15 0842 20 mcg/kg/min at 08/18/15 0842    Allergies  Allergen Reactions  . Accupril [Quinapril Hcl] Cough    Social History   Social History  . Marital Status: Married    Spouse Name: N/A  . Number of Children: 3  . 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 Smoking status: Former Smoker -- 0.50 packs/day for 5 years    Types: Cigarettes    Quit date: 07/25/1971  . Smokeless tobacco: Never Used  . Alcohol Use: 8.4 oz/week    14 Glasses of wine per week     Comment: socially  . Drug Use: No  . 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 Not on file   Social History Narrative     Review of Systems: General: negative for chills, fever, night sweats or weight changes.  Cardiovascular: negative for chest pain, dyspnea on exertion, edema, orthopnea, palpitations, paroxysmal nocturnal dyspnea or shortness of breath Dermatological: negative for rash Respiratory: negative for cough or wheezing Urologic: negative for hematuria Abdominal: negative for nausea, vomiting, diarrhea, bright red blood per rectum, melena, or hematemesis Neurologic: negative for visual changes, syncope, or dizziness All other systems reviewed and are otherwise negative except as noted above.    Blood pressure 150/84, pulse 91, height 5\' 2"  (1.575 m), weight 264 lb (119.75 kg).  General appearance: alert, cooperative, no distress and morbidly obese Neck: no carotid bruit and no JVD Lungs: clear to auscultation bilaterally Heart: regular rate and rhythm Extremities: no edema Pulses: 2+ and symmetric Skin: Skin color, texture, turgor normal. No rashes or lesions Neurologic: Grossly normal  EKG: 08/12/15 NSR, LAD   ASSESSMENT AND PLAN:   Chest pain with moderate risk of acute coronary syndrome Indeterminate Myoview and negative stress echo images, though the pt had typical angina with stress.  Essential hypertension ACEI d/c 06/2011 for refractory cough> resolved  Morbid obesity (HCC) BMI 48  GERD (gastroesophageal reflux disease) With prior esophageal stricture  Hyperlipidemia On statin Rx  Rheumatoid arthritis Unclear if she actually has Rheumatoid  arthritis she does have bone on bone in her knees Follows with Dr Alben Spittle  I reviewed her case with Dr Debara Pickett in the office today.  We are concerned that she had typical angina with her stress 08/18/15. She has had an abnormal Myoview and technically an abnormal stress echo with stress induced angina though her echo images were normal. She describes NTG responsive chest pain with stress as an OP that sounds typical for angina. We feel its best to proceed with a diagnostic cath. This was discussed with the pt and her husband and they agree.  The patient understands that risks included but are not limited to stroke (1 in 1000), death (1 in 89), kidney failure [usually temporary] (1 in 500), bleeding (1 in 200), allergic reaction [possibly serious] (1 in 200).  The patient understands and agrees to proceed.    Odile Veloso K PA-C 08/19/2015 11:20 AM

## 2015-08-24 ENCOUNTER — Encounter: Payer: Self-pay | Admitting: Family Medicine

## 2015-08-24 NOTE — Telephone Encounter (Signed)
Notes Recorded by Shellia Cleverly, RN on 08/20/2015 at 4:47 PM Reviewed results of stress echo with pt who states she was seen in the office yesterday by a PA and because of CP she had during the testing she has been set up for a cardiac cath for further evalaution.

## 2015-08-29 ENCOUNTER — Other Ambulatory Visit: Payer: Self-pay | Admitting: Family Medicine

## 2015-09-07 ENCOUNTER — Telehealth: Payer: Self-pay | Admitting: Gastroenterology

## 2015-09-08 NOTE — Telephone Encounter (Signed)
Patient has been given the go ahead from her cardiologist for the EGD. She has been told the pain is not heart related. Her heart cath was normal. She is scheduled for an EGD in the [facility] 10/08/15 arrive at 9:00 am. She still has her instructions. Understands to call back if she has additional questions or concerns.

## 2015-09-22 ENCOUNTER — Other Ambulatory Visit: Payer: Self-pay | Admitting: Family Medicine

## 2015-10-08 ENCOUNTER — Ambulatory Visit (AMBULATORY_SURGERY_CENTER): Payer: Medicare Other | Admitting: Gastroenterology

## 2015-10-08 ENCOUNTER — Encounter: Payer: Self-pay | Admitting: Gastroenterology

## 2015-10-08 ENCOUNTER — Other Ambulatory Visit: Payer: Self-pay | Admitting: Gastroenterology

## 2015-10-08 VITALS — BP 130/67 | HR 82 | Temp 98.0°F | Resp 13 | Ht 62.0 in | Wt 264.0 lb

## 2015-10-08 DIAGNOSIS — K219 Gastro-esophageal reflux disease without esophagitis: Secondary | ICD-10-CM

## 2015-10-08 DIAGNOSIS — R079 Chest pain, unspecified: Secondary | ICD-10-CM

## 2015-10-08 DIAGNOSIS — Q394 Esophageal web: Secondary | ICD-10-CM | POA: Diagnosis not present

## 2015-10-08 DIAGNOSIS — R1310 Dysphagia, unspecified: Secondary | ICD-10-CM | POA: Diagnosis not present

## 2015-10-08 LAB — COLOGUARD: COLOGUARD: NEGATIVE

## 2015-10-08 MED ORDER — SODIUM CHLORIDE 0.9 % IV SOLN: 500.0000 mL | INTRAVENOUS | Status: DC

## 2015-10-08 NOTE — Progress Notes (Signed)
Called to room to assist during endoscopic procedure.  Patient ID and intended procedure confirmed with present staff. Received instructions for my participation in the procedure from the performing physician.  

## 2015-10-08 NOTE — Patient Instructions (Signed)
Impressions/recommendations:  Hiatal hernia (handout given) Schatzki's ring (stricture---handout given) Post-dilation diet instructions given  YOU HAD AN ENDOSCOPIC PROCEDURE TODAY AT [facility]:   Refer to the procedure report that was given to you for any specific questions about what was found during the examination.  If the procedure report does not answer your questions, please call your gastroenterologist to clarify.  If you requested that your care partner not be given the details of your procedure findings, then the procedure report has been included in a sealed envelope for you to review at your convenience later.  YOU SHOULD EXPECT: Some feelings of bloating in the abdomen. Passage of more gas than usual.  Walking can help get rid of the air that was put into your GI tract during the procedure and reduce the bloating. If you had a lower endoscopy (such as a colonoscopy or flexible sigmoidoscopy) you may notice spotting of blood in your stool or on the toilet paper. If you underwent a bowel prep for your procedure, you may not have a normal bowel movement for a few days.  Please Note:  You might notice some irritation and congestion in your nose or some drainage.  This is from the oxygen used during your procedure.  There is no need for concern and it should clear up in a day or so.  SYMPTOMS TO REPORT IMMEDIATELY:   Following upper endoscopy (EGD)  Vomiting of blood or coffee ground material  New chest pain or pain under the shoulder blades  Painful or persistently difficult swallowing  New shortness of breath  Fever of 100F or higher  Black, tarry-looking stools  For urgent or emergent issues, a gastroenterologist can be reached at any hour by calling [phone].   DIET: Your first meal following the procedure should be a small meal and then it is ok to progress to your normal diet. Heavy or fried foods are harder to digest and may make you feel nauseous or  bloated.  Likewise, meals heavy in dairy and vegetables can increase bloating.  Drink plenty of fluids but you should avoid alcoholic beverages for 24 hours.  ACTIVITY:  You should plan to take it easy for the rest of today and you should NOT DRIVE or use heavy machinery until tomorrow (because of the sedation medicines used during the test).    FOLLOW UP: Our staff will call the number listed on your records the next business day following your procedure to check on you and address any questions or concerns that you may have regarding the information given to you following your procedure. If we do not reach you, we will leave a message.  However, if you are feeling well and you are not experiencing any problems, there is no need to return our call.  We will assume that you have returned to your regular daily activities without incident.  If any biopsies were taken you will be contacted by phone or by letter within the next 1-3 weeks.  Please call us at [phone] if you have not heard about the biopsies in 3 weeks.    SIGNATURES/CONFIDENTIALITY: You and/or your care partner have signed paperwork which will be entered into your electronic medical record.  These signatures attest to the fact that that the information above on your After Visit Summary has been reviewed and is understood.  Full responsibility of the confidentiality of this discharge information lies with you and/or your care-partner.

## 2015-10-08 NOTE — Op Note (Signed)
[facility] Patient Name: Rebecca Brooks Procedure Date: 10/08/2015 10:11 AM MRN: [id number] Endoscopist: Mauri Pole , MD Age: 65 Referring MD:  Date of Birth: 1951-01-13 Gender: Female Procedure:                Upper GI endoscopy Indications:              Dysphagia Medicines:                Propofol total dose 200 mg IV, Monitored Anesthesia                            Care Procedure:                Pre-Anesthesia Assessment:                           - Prior to the procedure, a History and Physical                            was performed, and patient medications and                            allergies were reviewed. The patient's tolerance of                            previous anesthesia was also reviewed. The risks                            and benefits of the procedure and the sedation                            options and risks were discussed with the patient.                            All questions were answered, and informed consent                            was obtained. Prior Anticoagulants: The patient                            last took aspirin 1 day prior to the procedure. ASA                            Grade Assessment: II - A patient with mild systemic                            disease. After reviewing the risks and benefits,                            the patient was deemed in satisfactory condition to                            undergo the procedure.  After obtaining informed consent, the endoscope was                            passed under direct vision. Throughout the                            procedure, the patient's blood pressure, pulse, and                            oxygen saturations were monitored continuously. The                            Model GIF-[id number]) scope was introduced                            through the mouth, and advanced to the second part                            of duodenum.  The upper GI endoscopy was                            accomplished without difficulty. The patient                            tolerated the procedure well. Scope In: Scope Out: Findings:      The stomach was normal.      The examined duodenum was normal.      A small hiatal hernia was present. Non obstructive schatzki's ring at       35cm just above GE junction. A TTS dilator was passed through the scope.       Dilation with an 18-19-20 mm balloon dilator was performed to 20 mm. The       dilation site was examined following endoscope reinsertion and showed no       bleeding, mucosal tear or perforation. Minimal heme noted on the balloon       on withdrawal. Estimated blood loss was minimal. Complications:            No immediate complications. Estimated Blood Loss:     Estimated blood loss was minimal. Impression:               - Normal stomach.                           - Normal examined duodenum.                           - Small hiatal hernia. Dilated.                           - No specimens collected. Recommendation:           - Patient has a contact number available for                            emergencies. The signs and symptoms of potential  delayed complications were discussed with the                            patient. Return to normal activities tomorrow.                            Written discharge instructions were provided to the                            patient.                           - Resume previous diet.                           - Continue present medications.                           - No repeat upper endoscopy.                           - Return to GI clinic PRN. Procedure Code(s):        --- Professional ---                           [phone], Esophagogastroduodenoscopy, flexible,                            transoral; with transendoscopic balloon dilation of                            esophagus (less than 30 mm diameter) CPT  copyright 2016 American Medical Association. All rights reserved. Mauri Pole, MD 10/08/2015 10:39:37 AM This report has been signed electronically. Number of Addenda: 0

## 2015-10-08 NOTE — Progress Notes (Signed)
A and O x # Report to [employer]

## 2015-10-11 ENCOUNTER — Telehealth: Payer: Self-pay | Admitting: *Deleted

## 2015-10-11 NOTE — Telephone Encounter (Signed)
Message left

## 2015-10-26 ENCOUNTER — Other Ambulatory Visit: Payer: Self-pay | Admitting: Family Medicine

## 2015-11-14 ENCOUNTER — Other Ambulatory Visit: Payer: Self-pay | Admitting: Family Medicine

## 2015-11-14 ENCOUNTER — Encounter: Payer: Self-pay | Admitting: Family Medicine

## 2015-11-15 ENCOUNTER — Other Ambulatory Visit: Payer: Self-pay | Admitting: Family Medicine

## 2015-11-15 ENCOUNTER — Ambulatory Visit: Payer: Medicare Other | Admitting: Family Medicine

## 2015-11-16 ENCOUNTER — Other Ambulatory Visit: Payer: Self-pay | Admitting: Family Medicine

## 2015-11-16 DIAGNOSIS — R601 Generalized edema: Secondary | ICD-10-CM

## 2015-11-16 MED ORDER — FUROSEMIDE 20 MG PO TABS: 20.0000 mg | ORAL_TABLET | Freq: Every day | ORAL | Status: DC | PRN

## 2015-11-27 ENCOUNTER — Other Ambulatory Visit: Payer: Self-pay | Admitting: Family Medicine

## 2015-11-29 ENCOUNTER — Other Ambulatory Visit: Payer: Self-pay | Admitting: Family Medicine

## 2015-12-10 ENCOUNTER — Other Ambulatory Visit: Payer: Self-pay | Admitting: Family Medicine

## 2015-12-10 ENCOUNTER — Encounter: Payer: Self-pay | Admitting: Family Medicine

## 2015-12-10 MED ORDER — FLUTICASONE PROPIONATE 50 MCG/ACT NA SUSP: 2.0000 | Freq: Every day | NASAL | Status: DC

## 2016-01-12 ENCOUNTER — Other Ambulatory Visit: Payer: Self-pay | Admitting: Family Medicine

## 2016-02-08 ENCOUNTER — Other Ambulatory Visit: Payer: Self-pay | Admitting: Family Medicine

## 2016-03-15 ENCOUNTER — Other Ambulatory Visit: Payer: Self-pay | Admitting: Family Medicine

## 2016-03-15 NOTE — Telephone Encounter (Signed)
She can have a 30 day supply with 3 rf on meds but her CPE was last  December and she has no future visit scheduled. Please reach out and schedule

## 2016-03-16 MED ORDER — ROPINIROLE HCL 0.5 MG PO TABS: ORAL_TABLET | ORAL | 3 refills | Status: DC

## 2016-03-16 MED ORDER — PANTOPRAZOLE SODIUM 40 MG PO TBEC: 40.0000 mg | DELAYED_RELEASE_TABLET | Freq: Every day | ORAL | 3 refills | Status: DC

## 2016-03-16 MED ORDER — HYDROCHLOROTHIAZIDE 25 MG PO TABS: 25.0000 mg | ORAL_TABLET | Freq: Every day | ORAL | 3 refills | Status: DC

## 2016-03-16 NOTE — Telephone Encounter (Signed)
So did we talk to her and let her know she needs an appt? Once that is done she can have the 30 day supply with refills I discussed

## 2016-03-16 NOTE — Telephone Encounter (Signed)
Medications refilled. paitent informed of PCP instructions. Scheduler will call to schedule appt.

## 2016-03-16 NOTE — Addendum Note (Signed)
Addended by: Sharon Seller B on: 03/16/2016 02:06 PM   Modules accepted: Orders

## 2016-03-20 ENCOUNTER — Other Ambulatory Visit: Payer: Self-pay | Admitting: Family Medicine

## 2016-03-28 ENCOUNTER — Other Ambulatory Visit: Payer: Self-pay | Admitting: Surgical Oncology

## 2016-03-28 DIAGNOSIS — K449 Diaphragmatic hernia without obstruction or gangrene: Secondary | ICD-10-CM

## 2016-04-02 ENCOUNTER — Encounter: Payer: Self-pay | Admitting: Family Medicine

## 2016-04-03 ENCOUNTER — Other Ambulatory Visit: Payer: Self-pay | Admitting: Family Medicine

## 2016-04-03 MED ORDER — ALBUTEROL SULFATE HFA 108 (90 BASE) MCG/ACT IN AERS: 2.0000 | INHALATION_SPRAY | Freq: Four times a day (QID) | RESPIRATORY_TRACT | 6 refills | Status: DC | PRN

## 2016-04-10 ENCOUNTER — Ambulatory Visit
Admission: RE | Admit: 2016-04-10 | Discharge: 2016-04-10 | Disposition: A | Discharge status: Home / Self Care | Payer: Medicare Other | Source: Ambulatory Visit | Attending: Surgical Oncology | Admitting: Surgical Oncology

## 2016-04-10 ENCOUNTER — Other Ambulatory Visit: Payer: Self-pay | Admitting: Family Medicine

## 2016-04-10 DIAGNOSIS — K449 Diaphragmatic hernia without obstruction or gangrene: Secondary | ICD-10-CM

## 2016-04-10 NOTE — Telephone Encounter (Signed)
Rx request to pharmacy/[facility]  

## 2016-04-14 ENCOUNTER — Ambulatory Visit: Payer: Medicare Other | Admitting: Family Medicine

## 2016-04-24 ENCOUNTER — Ambulatory Visit (INDEPENDENT_AMBULATORY_CARE_PROVIDER_SITE_OTHER): Payer: Medicare Other | Admitting: Rheumatology

## 2016-04-24 DIAGNOSIS — M1712 Unilateral primary osteoarthritis, left knee: Secondary | ICD-10-CM | POA: Diagnosis not present

## 2016-04-24 DIAGNOSIS — M1711 Unilateral primary osteoarthritis, right knee: Secondary | ICD-10-CM | POA: Diagnosis not present

## 2016-04-28 ENCOUNTER — Other Ambulatory Visit: Payer: Self-pay | Admitting: Family Medicine

## 2016-04-28 MED ORDER — ZOLPIDEM TARTRATE 10 MG PO TABS: 10.0000 mg | ORAL_TABLET | Freq: Every evening | ORAL | 0 refills | Status: DC | PRN

## 2016-04-28 NOTE — Telephone Encounter (Signed)
Requesting:   zolpidem Contract  None UDS  None Last OV  08/05/2015 Last Refill  #30 with 1 refill on 08/05/2015  Please Advise--pharmacy [facility] [address] Letta Kocher

## 2016-04-28 NOTE — Telephone Encounter (Signed)
Faxed hardcopy to [facility] 

## 2016-04-28 NOTE — Telephone Encounter (Signed)
I will allow one last rx of Ambien without refills but needs ot be seen for further refills has not been seen since January

## 2016-04-29 ENCOUNTER — Encounter: Payer: Self-pay | Admitting: Family Medicine

## 2016-05-03 ENCOUNTER — Ambulatory Visit (INDEPENDENT_AMBULATORY_CARE_PROVIDER_SITE_OTHER): Payer: Medicare Other | Admitting: Rheumatology

## 2016-05-03 DIAGNOSIS — M1711 Unilateral primary osteoarthritis, right knee: Secondary | ICD-10-CM | POA: Diagnosis not present

## 2016-05-03 DIAGNOSIS — M1712 Unilateral primary osteoarthritis, left knee: Secondary | ICD-10-CM

## 2016-05-29 ENCOUNTER — Ambulatory Visit (INDEPENDENT_AMBULATORY_CARE_PROVIDER_SITE_OTHER): Payer: Medicare Other | Admitting: Family Medicine

## 2016-05-29 ENCOUNTER — Encounter: Payer: Self-pay | Admitting: Family Medicine

## 2016-05-29 VITALS — BP 120/72 | HR 89 | Temp 98.4°F | Ht 62.0 in | Wt 291.2 lb

## 2016-05-29 DIAGNOSIS — M069 Rheumatoid arthritis, unspecified: Secondary | ICD-10-CM

## 2016-05-29 DIAGNOSIS — E782 Mixed hyperlipidemia: Secondary | ICD-10-CM

## 2016-05-29 DIAGNOSIS — E039 Hypothyroidism, unspecified: Secondary | ICD-10-CM | POA: Diagnosis not present

## 2016-05-29 DIAGNOSIS — I10 Essential (primary) hypertension: Secondary | ICD-10-CM

## 2016-05-29 DIAGNOSIS — J309 Allergic rhinitis, unspecified: Secondary | ICD-10-CM

## 2016-05-29 MED ORDER — EPINEPHRINE 0.3 MG/0.3ML IJ SOAJ: 0.3000 mg | Freq: Once | INTRAMUSCULAR | 3 refills | Status: AC

## 2016-05-29 MED ORDER — TRAMADOL HCL 50 MG PO TABS: 50.0000 mg | ORAL_TABLET | Freq: Three times a day (TID) | ORAL | 0 refills | Status: DC | PRN

## 2016-05-29 NOTE — Patient Instructions (Signed)
Dr Dennard Nip, weight loss doctor   Lidocaine gels and patches for pain  [employer] Obesity Obesity is defined as having too much total body fat and a body mass index (BMI) of 30 or more. BMI is an estimate of body fat and is calculated from your height and weight. BMI is typically calculated by your health care provider during regular wellness visits. Obesity happens when you consume more calories than you can burn by exercising or performing daily physical tasks. Prolonged obesity can cause major illnesses or emergencies, such as:  Stroke.  Heart disease.  Diabetes.  Cancer.  Arthritis.  High blood pressure (hypertension).  High cholesterol.  Sleep apnea.  Erectile dysfunction.  Infertility problems. CAUSES   Regularly eating unhealthy foods.  Physical inactivity.  Certain disorders, such as an underactive thyroid (hypothyroidism), Cushing's syndrome, and polycystic ovarian syndrome.  Certain medicines, such as steroids, some depression medicines, and antipsychotics.  Genetics.  Lack of sleep. DIAGNOSIS A health care provider can diagnose obesity after calculating your BMI. Obesity will be diagnosed if your BMI is 30 or higher. There are other methods of measuring obesity levels. Some other methods include measuring your skinfold thickness, your waist circumference, and comparing your hip circumference to your waist circumference. TREATMENT  A healthy treatment program includes some or all of the following:  Long-term dietary changes.  Exercise and physical activity.  Behavioral and lifestyle changes.  Medicine only under the supervision of your health care provider. Medicines may help, but only if they are used with diet and exercise programs. If your BMI is 40 or higher, your health care provider may recommend specialized surgery or programs to help with weight loss. An unhealthy treatment program includes:  Fasting.  Fad diets.  Supplements  and drugs. These choices do not succeed in long-term weight control. HOME CARE INSTRUCTIONS  Exercise and perform physical activity as directed by your health care provider. To increase physical activity, try the following:  Use stairs instead of elevators.  Park farther away from store entrances.  Garden, bike, or walk instead of watching television or using the computer.  Eat healthy, low-calorie foods and drinks on a regular basis. Eat more fruits and vegetables. Use low-calorie cookbooks or take healthy cooking classes.  Limit fast food, sweets, and processed snack foods.  Eat smaller portions.  Keep a daily journal of everything you eat. There are many free websites to help you with this. It may be helpful to measure your foods so you can determine if you are eating the correct portion sizes.  Avoid drinking alcohol. Drink more water and drinks without calories.  Take vitamins and supplements only as recommended by your health care provider.  Weight-loss support groups, Tax adviser, counselors, and stress reduction education can also be very helpful. SEEK IMMEDIATE MEDICAL CARE IF:  You have chest pain or tightness.  You have trouble breathing or feel short of breath.  You have weakness or leg numbness.  You feel confused or have trouble talking.  You have sudden changes in your vision.   This information is not intended to replace advice given to you by your health care provider. Make sure you discuss any questions you have with your health care provider.   Document Released: 08/17/2004 Document Revised: 07/31/2014 Document Reviewed: 08/16/2011 Elsevier Interactive Patient Education [employer].

## 2016-05-29 NOTE — Progress Notes (Signed)
Pre visit review using our clinic review tool, if applicable. No additional management support is needed unless otherwise documented below in the visit note. 

## 2016-05-30 ENCOUNTER — Other Ambulatory Visit: Payer: Self-pay | Admitting: Family Medicine

## 2016-05-30 DIAGNOSIS — D649 Anemia, unspecified: Secondary | ICD-10-CM

## 2016-05-30 DIAGNOSIS — K625 Hemorrhage of anus and rectum: Secondary | ICD-10-CM

## 2016-05-30 LAB — COMPREHENSIVE METABOLIC PANEL
ALT: 12 U/L (ref 0–35)
AST: 16 U/L (ref 0–37)
Albumin: 3.8 g/dL (ref 3.5–5.2)
Alkaline Phosphatase: 97 U/L (ref 39–117)
BILIRUBIN TOTAL: 0.4 mg/dL (ref 0.2–1.2)
BUN: 21 mg/dL (ref 6–23)
CALCIUM: 9.3 mg/dL (ref 8.4–10.5)
CHLORIDE: 102 meq/L (ref 96–112)
CO2: 29 meq/L (ref 19–32)
Creatinine, Ser: 0.85 mg/dL (ref 0.40–1.20)
GFR: 71.14 mL/min (ref >60.00)
Glucose, Bld: 83 mg/dL (ref 70–99)
POTASSIUM: 4.1 meq/L (ref 3.5–5.1)
Sodium: 139 mEq/L (ref 135–145)
Total Protein: 6.7 g/dL (ref 6.0–8.3)

## 2016-05-30 LAB — LIPID PANEL
CHOL/HDL RATIO: 3
Cholesterol: 196 mg/dL (ref 0–200)
HDL: 73.6 mg/dL (ref >39.00)
LDL CALC: 88 mg/dL (ref 0–99)
NonHDL: 122.44
TRIGLYCERIDES: 172 mg/dL — AB (ref 0.0–149.0)
VLDL: 34.4 mg/dL (ref 0.0–40.0)

## 2016-05-30 LAB — CBC
HEMATOCRIT: 34.5 % — AB (ref 36.0–46.0)
HEMOGLOBIN: 11.2 g/dL — AB (ref 12.0–15.0)
MCHC: 32.5 g/dL (ref 30.0–36.0)
MCV: 83.7 fl (ref 78.0–100.0)
PLATELETS: 452 10*3/uL — AB (ref 150.0–400.0)
RBC: 4.12 Mil/uL (ref 3.87–5.11)
RDW: 14.1 % (ref 11.5–15.5)
WBC: 11.8 10*3/uL — ABNORMAL HIGH (ref 4.0–10.5)

## 2016-05-30 LAB — TSH: TSH: 1.9 u[IU]/mL (ref 0.35–4.50)

## 2016-06-02 ENCOUNTER — Ambulatory Visit (INDEPENDENT_AMBULATORY_CARE_PROVIDER_SITE_OTHER): Payer: Medicare Other | Admitting: Rheumatology

## 2016-06-02 VITALS — BP 144/77 | HR 84

## 2016-06-02 DIAGNOSIS — M25562 Pain in left knee: Secondary | ICD-10-CM

## 2016-06-02 DIAGNOSIS — M1712 Unilateral primary osteoarthritis, left knee: Secondary | ICD-10-CM | POA: Diagnosis not present

## 2016-06-02 DIAGNOSIS — M25561 Pain in right knee: Secondary | ICD-10-CM

## 2016-06-02 DIAGNOSIS — M1711 Unilateral primary osteoarthritis, right knee: Secondary | ICD-10-CM | POA: Diagnosis not present

## 2016-06-02 DIAGNOSIS — M170 Bilateral primary osteoarthritis of knee: Secondary | ICD-10-CM

## 2016-06-02 MED ORDER — TRIAMCINOLONE ACETONIDE 40 MG/ML IJ SUSP
40.0000 mg | INTRAMUSCULAR | Status: AC | PRN
  Administered 2016-06-02: 40 mg via INTRA_ARTICULAR

## 2016-06-02 MED ORDER — LIDOCAINE HCL 1 % IJ SOLN
1.5000 mL | INTRAMUSCULAR | Status: AC | PRN
  Administered 2016-06-02: 1.5 mL

## 2016-06-02 NOTE — Progress Notes (Signed)
   Procedure Note  Patient: Rebecca Brooks             Date of Birth: 1951/03/12           MRN: [id number]             Visit Date: 06/02/2016  Procedures: Visit Diagnoses: Osteoarthritis of both knees, unspecified osteoarthritis type  Pain in both knees, unspecified chronicity  Objective: Vital Signs: BP (!) 144/77 (BP Location: Left Arm, Patient Position: Sitting, Cuff Size: Large)   Pulse 84    Large Joint Inj Date/Time: 06/02/2016 2:59 PM Performed by: Eliezer Lofts Authorized by: Eliezer Lofts   Consent Given by:  Patient Site marked: the procedure site was marked   Timeout: prior to procedure the correct patient, procedure, and site was verified   Indications:  Pain Location:  Knee Site:  R knee Prep: patient was prepped and draped in usual sterile fashion   Needle Size:  27 G Needle Length:  1.5 inches Approach:  Medial Ultrasound Guidance: No   Fluoroscopic Guidance: No   Arthrogram: No   Medications:  1.5 mL lidocaine 1 %; 40 mg triamcinolone acetonide 40 MG/ML Aspiration Attempted: Yes   Aspirate amount (mL):  0 Patient tolerance:  Patient tolerated the procedure well with no immediate complications  After informed consent was obtained, the site was prepped in usual sterile fashion, injected with one half mL to 1% lidocaine mixed with 40 mg of Kenalog. Patient tolerated procedure well. There are no complications. Large Joint Inj Date/Time: 06/02/2016 3:00 PM Performed by: Eliezer Lofts Authorized by: Eliezer Lofts   Consent Given by:  Patient Site marked: the procedure site was marked   Timeout: prior to procedure the correct patient, procedure, and site was verified   Indications:  Pain Location:  Knee Site:  L knee Prep: patient was prepped and draped in usual sterile fashion   Needle Size:  27 G Needle Length:  1.5 inches Approach:  Medial Ultrasound Guidance: No   Fluoroscopic Guidance: No   Arthrogram: No   Medications:  1.5 mL lidocaine 1  %; 40 mg triamcinolone acetonide 40 MG/ML Aspiration Attempted: Yes   Aspirate amount (mL):  0 Patient tolerance:  Patient tolerated the procedure well with no immediate complications  After informed consent was obtained, the site was prepped in usual sterile fashion, injected with one half mL to 1% lidocaine mixed with 40 mg of Kenalog. Patient tolerated procedure well. There are no complications.   Patient is traveling to [place] in the next few weeks. She's been having some knee joint pain. We discussed on her previous visit that before her trip, I can give her cortisone injections of her knees are hurting. She does have a history of chronic knee joint pain and osteoarthritis of bilateral knees.  We offered her braces for her knees but she states that they slide down her legs and they do not serve her needs well. She is planning on getting some type of wheelchair for her travels in [place]. She is planning on being there for approximately a month or so.  We discussed Shields brace as an option. She will go to [facility] supply to investigate her wheelchair and inquire about Shields brace. If this works out well for her she'll ask [place] for a prescription

## 2016-06-04 NOTE — Assessment & Plan Note (Signed)
Encouraged DASH diet, decrease po intake and increase exercise as tolerated. Needs 7-8 hours of sleep nightly. Avoid trans fats, eat small, frequent meals every 4-5 hours with lean proteins, complex carbs and healthy fats. Minimize simple carbs 

## 2016-06-04 NOTE — Assessment & Plan Note (Signed)
Well controlled, no changes to meds. Encouraged heart healthy diet such as the DASH diet and exercise as tolerated.  °

## 2016-06-04 NOTE — Progress Notes (Signed)
[name], female   DOB: 1950/11/20, 65 y.o.   MRN: [id number]   Subjective:    [name], female    DOB: July 28, 1950, 65 y.o.   MRN: [id number]  Chief Complaint  Patient presents with  . Follow-up    HPI Patient is in today for follow up. No recent illness or hospitalizations. She continues to struggle with fatigue, myalgias or arthralgias. Is in need of a refill on her Epipen which she has never used but hers is expired. Denies CP/palp/SOB/HA/congestion/fevers/GI or GU c/o. Taking meds as prescribed  Past Medical History:  Diagnosis Date  . Abnormal nuclear stress test 08/23/2015  . Arthritis   . Chicken pox as a child  . Essential hypertension 07/17/2011   ACEI d/c 06/2011 for refractory cough> resolved   . GERD (gastroesophageal reflux disease)   . Hyperlipidemia   . Hypertension   . Hypothyroidism   . Insomnia 11/01/2014  . Low back pain radiating to right leg    intermittent and occasional numbness of skin over right hip in certain positions  . Measles as a child  . Mumps as a child  . Obesity   . Pneumonia    this October from which she has said inhailer  . Recurrent epistaxis 02/20/2012  . Rheumatoid arthritis ([place]) 09/10/2011   Bone on bone in knees Pain in L>R knees.   . RLS (restless legs syndrome) 11/01/2014    Past Surgical History:  Procedure Laterality Date  . CARDIAC CATHETERIZATION N/A 08/23/2015   Procedure: Left Heart Cath and Coronary Angiography;  Surgeon: [name], MD;  Location: [facility];  Service: Cardiovascular;  Laterality: N/A;  . CESAREAN SECTION     X 3  . INGUINAL HERNIA REPAIR Right 65 yrs old  . THYROIDECTOMY     total for benign tumor, Parathyroid spared  . TONSILLECTOMY    . TOTAL HIP ARTHROPLASTY Right 2006   secondary to congenital  hip defect    Family History  Problem Relation Age of Onset  . Lung cancer Mother 26    smoker  . Heart Problems Mother     tachycardia  . Hearing loss  Mother   . Anxiety disorder Mother     anxiety, claustrophobia  . Osteoarthritis Father   . Hyperlipidemia Father   . Hyperlipidemia Sister   . Migraines Sister   . Hypertension Sister   . Colon cancer Maternal Grandmother   . Heart attack Maternal Grandfather   . Aneurysm Paternal Grandfather     abdominal  . Heart disease Paternal Grandfather     AAA rupture, smoker  . Anuerysm Father     AAA  . Heart Problems Sister     tachycardia    Social History   Social History  . Marital status: Married    Spouse name: N/A  . Number of children: 3  . 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 Smoking status: Former Smoker    Packs/day: 0.50    Years: 5.00    Types: Cigarettes    Quit date: 07/25/1971  . Smokeless tobacco: Never Used  . Alcohol use 8.4 oz/week    14 Glasses of wine per week     Comment: socially  . Drug use: No  . Sexual activity: No     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  Other Topics Concern  . Not on file   Social History Narrative  . No narrative on file    Outpatient Medications Prior to Visit  Medication Sig Dispense Refill  . albuterol (PROAIR HFA) 108 (90 Base) MCG/ACT inhaler Inhale 2 puffs into the lungs every 6 (six) hours as needed for wheezing or shortness of breath. 8.5 Inhaler 6  . aspirin EC 81 MG tablet Take 1 tablet (81 mg total) by mouth daily.    . cetirizine (ZYRTEC) 10 MG tablet Take 1 tablet (10 mg total) by mouth daily. 30 tablet 10  . Cholecalciferol (VITAMIN D3) 1000 units CAPS TAKE 1 CAPSULE BY MOUTH EVERY DAY 100 capsule 3  . famotidine (PEPCID) 20 MG tablet TAKE 1 TABLET BY MOUTH AT BEDTIME AS NEEDED FOR HEARTBURN OR INDIGESTION 90 tablet 0  . fluticasone (FLONASE) 50 MCG/ACT nasal spray Place 2 sprays into both nostrils daily. 16 g 4  . furosemide (LASIX) 20 MG tablet Take 1 tablet (20 mg total) by mouth daily as needed. 1 tablet  daily as needed 30 tablet 3  . hydrochlorothiazide (HYDRODIURIL) 25 MG tablet TAKE 1 TABLET BY MOUTH DAILY 90 tablet 0  . hydrochlorothiazide (HYDRODIURIL) 25 MG tablet Take 1 tablet (25 mg total) by mouth daily. 30 tablet 3  . hyoscyamine (LEVSIN SL) 0.125 MG SL tablet Place 1 tablet (0.125 mg total) under the tongue every 4 (four) hours as needed. 30 tablet 0  . levothyroxine (SYNTHROID, LEVOTHROID) 100 MCG tablet TAKE 1 TABLET(100 MCG) BY MOUTH DAILY BEFORE BREAKFAST 90 tablet 0  . MELATONIN PO Take 1 tablet by mouth at bedtime as needed (sleep).    . metoprolol tartrate (LOPRESSOR) 25 MG tablet TAKE 1 TABLET(25 MG) BY MOUTH TWICE DAILY 180 tablet 0  . nitroGLYCERIN (NITROSTAT) 0.4 MG SL tablet Place 1 tablet (0.4 mg total) under the tongue every 5 (five) minutes as needed for chest pain. 25 tablet 3  . pantoprazole (PROTONIX) 40 MG tablet Take 1 tablet (40 mg total) by mouth daily. 30 tablet 3  . potassium chloride SA (K-DUR,KLOR-CON) 20 MEQ tablet Take 1 tablet (20 mEq total) by mouth daily as needed (lasix use). 30 tablet 3  . rOPINIRole (REQUIP) 0.5 MG tablet TAKE 1 TO 2 TABLETS(0.5 TO 1 MG) BY MOUTH AT BEDTIME AS NEEDED 180 tablet 0  . simvastatin (ZOCOR) 40 MG tablet TAKE 1 TABLET BY MOUTH EVERY EVENING 90 tablet 0  . traMADol (ULTRAM) 50 MG tablet Take 50-100 mg by mouth every 12 (twelve) hours as needed for moderate pain.    Marland Kitchen zolpidem (AMBIEN) 10 MG tablet Take 1 tablet (10 mg total) by mouth at bedtime as needed for sleep. 30 tablet 0   Facility-Administered Medications Prior to Visit  Medication Dose Route Frequency Provider Last Rate Last Dose  . DOBUTamine (DOBUTREX) 1,000 mcg/mL in dextrose 5% 250 mL infusion  20 mcg/kg/min Intravenous Continuous Minus Breeding, MD 143.9 mL/hr at 08/18/15 0842 20 mcg/kg/min at 08/18/15 0842    Allergies  Allergen Reactions  . Accupril [Quinapril Hcl] Cough    Review of Systems  Constitutional: Positive for malaise/fatigue. Negative for  fever.  HENT: Negative for congestion.   Eyes: Negative for blurred vision and photophobia.  Respiratory: Negative for shortness of breath.   Cardiovascular: Negative for chest pain, palpitations and leg swelling.  Gastrointestinal: Negative for abdominal pain, blood in stool and nausea.  Genitourinary: Negative for dysuria and frequency.  Musculoskeletal: Positive for myalgias. Negative for falls.  Skin: Negative  for rash.  Neurological: Negative for dizziness, loss of consciousness and headaches.  Endo/Heme/Allergies: Negative for environmental allergies.  Psychiatric/Behavioral: Negative for depression. The patient is not nervous/anxious.        Objective:    Physical Exam  Constitutional: She is oriented to person, place, and time. She appears well-developed and well-nourished. No distress.  HENT:  Head: Normocephalic and atraumatic.  Nose: Nose normal.  Eyes: Right eye exhibits no discharge. Left eye exhibits no discharge.  Neck: Normal range of motion. Neck supple.  Cardiovascular: Normal rate and regular rhythm.   No murmur heard. Pulmonary/Chest: Effort normal and breath sounds normal.  Abdominal: Soft. Bowel sounds are normal. There is no tenderness.  Musculoskeletal: She exhibits no edema.  Neurological: She is alert and oriented to person, place, and time.  Skin: Skin is warm and dry.  Psychiatric: She has a normal mood and affect.  Nursing note and vitals reviewed.   BP 120/72 (BP Location: Left Arm, Patient Position: Sitting, Cuff Size: Large)   Pulse 89   Temp 98.4 F (36.9 C) (Oral)   Ht 5\' 2"  (1.575 m)   Wt 291 lb 4 oz (132.1 kg)   SpO2 98%   BMI 53.27 kg/m  Wt Readings from Last 3 Encounters:  05/29/16 291 lb 4 oz (132.1 kg)  10/08/15 264 lb (119.7 kg)  08/23/15 260 lb (117.9 kg)     Lab Results  Component Value Date   WBC 11.8 (H) 05/29/2016   HGB 11.2 (L) 05/29/2016   HCT 34.5 (L) 05/29/2016   PLT 452.0 (H) 05/29/2016   GLUCOSE 83 05/29/2016    CHOL 196 05/29/2016   TRIG 172.0 (H) 05/29/2016   HDL 73.60 05/29/2016   LDLCALC 88 05/29/2016   ALT 12 05/29/2016   AST 16 05/29/2016   NA 139 05/29/2016   K 4.1 05/29/2016   CL 102 05/29/2016   CREATININE 0.85 05/29/2016   BUN 21 05/29/2016   CO2 29 05/29/2016   TSH 1.90 05/29/2016   INR 0.99 08/19/2015    Lab Results  Component Value Date   TSH 1.90 05/29/2016   Lab Results  Component Value Date   WBC 11.8 (H) 05/29/2016   HGB 11.2 (L) 05/29/2016   HCT 34.5 (L) 05/29/2016   MCV 83.7 05/29/2016   PLT 452.0 (H) 05/29/2016   Lab Results  Component Value Date   NA 139 05/29/2016   K 4.1 05/29/2016   CO2 29 05/29/2016   GLUCOSE 83 05/29/2016   BUN 21 05/29/2016   CREATININE 0.85 05/29/2016   BILITOT 0.4 05/29/2016   ALKPHOS 97 05/29/2016   AST 16 05/29/2016   ALT 12 05/29/2016   PROT 6.7 05/29/2016   ALBUMIN 3.8 05/29/2016   CALCIUM 9.3 05/29/2016   ANIONGAP 12 08/11/2015   GFR 71.14 05/29/2016   Lab Results  Component Value Date   CHOL 196 05/29/2016   Lab Results  Component Value Date   HDL 73.60 05/29/2016   Lab Results  Component Value Date   LDLCALC 88 05/29/2016   Lab Results  Component Value Date   TRIG 172.0 (H) 05/29/2016   Lab Results  Component Value Date   CHOLHDL 3 05/29/2016   No results found for: HGBA1C     Assessment & Plan:   Problem List Items Addressed This Visit    Morbid obesity ([place])    Encouraged DASH diet, decrease po intake and increase exercise as tolerated. Needs 7-8 hours of sleep nightly. Avoid trans fats, eat  small, frequent meals every 4-5 hours with lean proteins, complex carbs and healthy fats. Minimize simple carbs      Hypothyroidism    On Levothyroxine, continue to monitor      Relevant Orders   TSH (Completed)   Hyperlipidemia - Primary    Tolerating statin, encouraged heart healthy diet, avoid trans fats, minimize simple carbs and saturated fats. Increase exercise as tolerated      Relevant  Orders   Lipid panel (Completed)   Essential hypertension    Well controlled, no changes to meds. Encouraged heart healthy diet such as the DASH diet and exercise as tolerated.       Relevant Orders   CBC (Completed)   Comprehensive metabolic panel (Completed)   Rheumatoid arthritis ([facility])   Relevant Medications   traMADol (ULTRAM) 50 MG tablet   Allergic rhinitis    Given refill for Epipen.          I have discontinued Ms. Perlstein's traMADol. I am also having her start on EPINEPHrine and traMADol. Additionally, I am having her maintain her potassium chloride SA, cetirizine, aspirin EC, nitroGLYCERIN, hyoscyamine, MELATONIN PO, furosemide, Vitamin D3, fluticasone, metoprolol tartrate, simvastatin, famotidine, hydrochlorothiazide, hydrochlorothiazide, pantoprazole, levothyroxine, albuterol, rOPINIRole, and zolpidem.  Meds ordered this encounter  Medications  . EPINEPHrine (EPIPEN 2-PAK) 0.3 mg/0.3 mL IJ SOAJ injection    Sig: Inject 0.3 mLs (0.3 mg total) into the muscle once.    Dispense:  2 Device    Refill:  3  . traMADol (ULTRAM) 50 MG tablet    Sig: Take 1-2 tablets (50-100 mg total) by mouth every 8 (eight) hours as needed.    Dispense:  90 tablet    Refill:  0     Penni Homans, MD

## 2016-06-04 NOTE — Assessment & Plan Note (Signed)
On Levothyroxine, continue to monitor 

## 2016-06-04 NOTE — Assessment & Plan Note (Signed)
Given refill for Epipen.

## 2016-06-04 NOTE — Assessment & Plan Note (Signed)
Tolerating statin, encouraged heart healthy diet, avoid trans fats, minimize simple carbs and saturated fats. Increase exercise as tolerated 

## 2016-06-09 ENCOUNTER — Other Ambulatory Visit: Payer: Self-pay | Admitting: Family Medicine

## 2016-06-12 ENCOUNTER — Other Ambulatory Visit: Payer: Self-pay | Admitting: Family Medicine

## 2016-06-17 ENCOUNTER — Other Ambulatory Visit: Payer: Self-pay | Admitting: Family Medicine

## 2016-06-21 ENCOUNTER — Other Ambulatory Visit: Payer: Self-pay | Admitting: Family Medicine

## 2016-06-21 NOTE — Telephone Encounter (Signed)
eScribe request from [facility] for refill on Tramadol 50 mg Last filled - 05/29/16, #[id number]  Last AEX - 05/29/16 Next AEX - 6-Mths Per [place], last Rx printed on 05/29/16 was filled by patient Please Advise on refills/SLS 11/29  Medication Detail    Disp Refills Start End   traMADol (ULTRAM) 50 MG tablet 90 tablet 0 05/29/2016    Sig - Route: Take 1-2 tablets (50-100 mg total) by mouth every 8 (eight) hours as needed. - Oral   Class: Print   Pharmacy   [id number] - [place], [address] AT [address]

## 2016-06-22 NOTE — Telephone Encounter (Signed)
Faxed hardcopy for Tramadol to [facility]

## 2016-06-25 ENCOUNTER — Other Ambulatory Visit: Payer: Self-pay | Admitting: Family Medicine

## 2016-07-09 ENCOUNTER — Other Ambulatory Visit: Payer: Self-pay | Admitting: Family Medicine

## 2016-07-14 ENCOUNTER — Telehealth: Payer: Self-pay | Admitting: *Deleted

## 2016-07-14 MED ORDER — TRAMADOL HCL 50 MG PO TABS: ORAL_TABLET | ORAL | 0 refills | Status: DC

## 2016-07-14 NOTE — Telephone Encounter (Signed)
OK to refill same amount. Dr Charlett Blake may want her to eventually come in to sign a CSC and leave a urine, but doesn't need to be done immediately. Of note, let her know what we (or at least I) do knee injections here if it is helpful.

## 2016-07-14 NOTE — Telephone Encounter (Signed)
Rx faxed to pharmacy. Notified pt of below. States she has had injections with Dr Estanislado Pandy (rheumatology) in the past. Has also received ?Hyalgan (rooster comb) injections and steroid injections as well. Will keep this in mind for future need.

## 2016-07-14 NOTE — Telephone Encounter (Signed)
Received fax from [facility] requesting refill of Tramadol:  Last Rx:  06/21/2016, #90 Last OV: 05/29/16 Next OV:  11/26/16 UDS: no UDS or CSC on file.  Please advise in Dr Frederik Pear absence?

## 2016-07-28 ENCOUNTER — Other Ambulatory Visit: Payer: Self-pay | Admitting: Family Medicine

## 2016-08-08 ENCOUNTER — Other Ambulatory Visit: Payer: Self-pay | Admitting: Family Medicine

## 2016-08-09 ENCOUNTER — Other Ambulatory Visit: Payer: Self-pay | Admitting: Family Medicine

## 2016-08-22 ENCOUNTER — Other Ambulatory Visit: Payer: Self-pay

## 2016-08-22 NOTE — Telephone Encounter (Signed)
This is Dr. Frederik Pear patient. I refilled her last rx as I was covering that day. TY.

## 2016-08-22 NOTE — Telephone Encounter (Signed)
Refill request sent into office from [facility]. Pt was last seen 12/22/2017Rx was given for Tramadol #90 tablets . Please advisefor further recommendation. TL/CMA

## 2016-08-23 MED ORDER — TRAMADOL HCL 50 MG PO TABS: ORAL_TABLET | ORAL | 0 refills | Status: DC

## 2016-08-24 NOTE — Telephone Encounter (Signed)
Faxed hardcopy for Tramadol To [facility] on [address].

## 2016-08-28 ENCOUNTER — Other Ambulatory Visit: Payer: Self-pay | Admitting: Family Medicine

## 2016-08-30 ENCOUNTER — Ambulatory Visit: Payer: Medicare Other | Admitting: Family Medicine

## 2016-08-30 ENCOUNTER — Telehealth: Payer: Self-pay | Admitting: Family Medicine

## 2016-08-30 NOTE — Telephone Encounter (Signed)
No charge. 

## 2016-08-30 NOTE — Telephone Encounter (Signed)
Patient called very emotional regarding her 11am appointment stating due to the rain she missed the exit, offered patient another appointment time declined due to the weather, patient [facility] for 08/31/16 with PCP, charge or no charge

## 2016-08-31 ENCOUNTER — Ambulatory Visit: Payer: Medicare Other | Admitting: Family Medicine

## 2016-09-01 ENCOUNTER — Other Ambulatory Visit: Payer: Self-pay | Admitting: Family Medicine

## 2016-09-01 ENCOUNTER — Encounter: Payer: Self-pay | Admitting: Family Medicine

## 2016-09-07 ENCOUNTER — Telehealth: Payer: Self-pay | Admitting: Family Medicine

## 2016-09-07 NOTE — Telephone Encounter (Signed)
LVM informing patient to disregard no show fee.

## 2016-09-07 NOTE — Telephone Encounter (Signed)
Patient called stating that she received a bill for a no showed appointment on 08/31/16. She states that she did not come into the office because she was feeling better. She stated that when she called to reschedule she told the receptionist that if she was going to be charged she would just come in for the appointment. Patient stated that the receptionist told her it would be fine and she would take care of it. Patient would like to know if she still needs to pay the $50.00 no show fee. Please advise   Phone: [phone]

## 2016-09-07 NOTE — Telephone Encounter (Signed)
No need to charge for no show

## 2016-09-15 ENCOUNTER — Other Ambulatory Visit: Payer: Self-pay | Admitting: Family Medicine

## 2016-09-15 MED ORDER — FAMOTIDINE 20 MG PO TABS: 20.0000 mg | ORAL_TABLET | Freq: Every day | ORAL | 2 refills | Status: DC

## 2016-09-15 MED ORDER — TRAMADOL HCL 50 MG PO TABS: ORAL_TABLET | ORAL | 0 refills | Status: DC

## 2016-09-15 NOTE — Telephone Encounter (Signed)
Last Tramadol refill was on 08/23/2016   #90 with 0 refills Last office visit on 05/29/2016--next scheduled office visit is 11/27/2016 No uds No contract

## 2016-09-15 NOTE — Telephone Encounter (Signed)
I have signed but have her do a UDS and contract

## 2016-09-15 NOTE — Telephone Encounter (Signed)
Faxed hardcopy for Tramadol to [facility]

## 2016-09-20 ENCOUNTER — Other Ambulatory Visit: Payer: Self-pay | Admitting: Family Medicine

## 2016-09-27 DIAGNOSIS — Z713 Dietary counseling and surveillance: Secondary | ICD-10-CM | POA: Diagnosis not present

## 2016-09-27 DIAGNOSIS — Z6841 Body Mass Index (BMI) 40.0 and over, adult: Secondary | ICD-10-CM | POA: Diagnosis not present

## 2016-10-03 ENCOUNTER — Ambulatory Visit (INDEPENDENT_AMBULATORY_CARE_PROVIDER_SITE_OTHER): Payer: Medicare Other | Admitting: Rheumatology

## 2016-10-03 ENCOUNTER — Encounter: Payer: Self-pay | Admitting: Rheumatology

## 2016-10-03 ENCOUNTER — Other Ambulatory Visit: Payer: Self-pay | Admitting: Family Medicine

## 2016-10-03 VITALS — BP 173/85 | HR 86 | Resp 13 | Ht 62.0 in | Wt 298.0 lb

## 2016-10-03 DIAGNOSIS — Z96641 Presence of right artificial hip joint: Secondary | ICD-10-CM

## 2016-10-03 DIAGNOSIS — M25562 Pain in left knee: Secondary | ICD-10-CM

## 2016-10-03 DIAGNOSIS — M19019 Primary osteoarthritis, unspecified shoulder: Secondary | ICD-10-CM

## 2016-10-03 DIAGNOSIS — M51369 Other intervertebral disc degeneration, lumbar region without mention of lumbar back pain or lower extremity pain: Secondary | ICD-10-CM

## 2016-10-03 DIAGNOSIS — M1711 Unilateral primary osteoarthritis, right knee: Secondary | ICD-10-CM | POA: Diagnosis not present

## 2016-10-03 DIAGNOSIS — Z8679 Personal history of other diseases of the circulatory system: Secondary | ICD-10-CM | POA: Diagnosis not present

## 2016-10-03 DIAGNOSIS — M1712 Unilateral primary osteoarthritis, left knee: Secondary | ICD-10-CM | POA: Diagnosis not present

## 2016-10-03 DIAGNOSIS — M79642 Pain in left hand: Secondary | ICD-10-CM

## 2016-10-03 DIAGNOSIS — M19041 Primary osteoarthritis, right hand: Secondary | ICD-10-CM | POA: Diagnosis not present

## 2016-10-03 DIAGNOSIS — M19042 Primary osteoarthritis, left hand: Secondary | ICD-10-CM | POA: Diagnosis not present

## 2016-10-03 DIAGNOSIS — M9420 Chondromalacia, unspecified site: Secondary | ICD-10-CM

## 2016-10-03 DIAGNOSIS — Z8639 Personal history of other endocrine, nutritional and metabolic disease: Secondary | ICD-10-CM

## 2016-10-03 DIAGNOSIS — M5136 Other intervertebral disc degeneration, lumbar region: Secondary | ICD-10-CM | POA: Insufficient documentation

## 2016-10-03 DIAGNOSIS — M170 Bilateral primary osteoarthritis of knee: Secondary | ICD-10-CM

## 2016-10-03 DIAGNOSIS — M5030 Other cervical disc degeneration, unspecified cervical region: Secondary | ICD-10-CM

## 2016-10-03 DIAGNOSIS — Z862 Personal history of diseases of the blood and blood-forming organs and certain disorders involving the immune mechanism: Secondary | ICD-10-CM | POA: Insufficient documentation

## 2016-10-03 DIAGNOSIS — M25561 Pain in right knee: Secondary | ICD-10-CM

## 2016-10-03 MED ORDER — METOPROLOL TARTRATE 25 MG PO TABS: 25.0000 mg | ORAL_TABLET | Freq: Two times a day (BID) | ORAL | 1 refills | Status: DC

## 2016-10-03 MED ORDER — LIDOCAINE HCL 1 % IJ SOLN
1.5000 mL | INTRAMUSCULAR | Status: AC | PRN
  Administered 2016-10-03: 1.5 mL

## 2016-10-03 MED ORDER — TRIAMCINOLONE ACETONIDE 40 MG/ML IJ SUSP
30.0000 mg | INTRAMUSCULAR | Status: AC | PRN
  Administered 2016-10-03: 30 mg via INTRA_ARTICULAR

## 2016-10-03 NOTE — Progress Notes (Deleted)
Office Visit Note  Patient: Rebecca Brooks             Date of Birth: August 06, 1950           MRN: [id number]             PCP: Penni Homans, MD Referring: Mosie Lukes, MD Visit Date: 10/03/2016 Occupation: @GUAROCC @    Subjective:  No chief complaint on file.   History of Present Illness: Rebecca Brooks is a 66 y.o. female ***   Activities of Daily Living:  Patient reports morning stiffness for *** {minute/hour:19697}.   Patient {ACTIONS;DENIES/REPORTS:21021675::"Denies"} nocturnal pain.  Difficulty dressing/grooming: {ACTIONS;DENIES/REPORTS:21021675::"Denies"} Difficulty climbing stairs: {ACTIONS;DENIES/REPORTS:21021675::"Denies"} Difficulty getting out of chair: {ACTIONS;DENIES/REPORTS:21021675::"Denies"} Difficulty using hands for taps, buttons, cutlery, and/or writing: {ACTIONS;DENIES/REPORTS:21021675::"Denies"}   No Rheumatology ROS completed.   PMFS History:  Patient Active Problem List   Diagnosis Date Noted  . Abnormal nuclear stress test 08/23/2015  . Globus sensation 08/13/2015  . Special screening for malignant neoplasms, colon 08/13/2015  . Chest pain at rest   . Chest pain with moderate risk of acute coronary syndrome 07/28/2015  . Annual physical exam 07/11/2015  . RLS (restless legs syndrome) 11/01/2014  . Insomnia 11/01/2014  . Thoracic back pain 01/12/2014  . Allergic rhinitis 11/03/2013  . Preventative health care 12/13/2012  . Recurrent epistaxis 02/20/2012  . Shoulder pain, bilateral 11/01/2011  . Knee pain, right 10/18/2011  . Peripheral edema 09/10/2011  . Rheumatoid arthritis ([place]) 09/10/2011  . Asthma 08/19/2011  . Palpitations 08/01/2011  . Anemia 08/01/2011  . Hypokalemia 08/01/2011  . Arthritis   . GERD (gastroesophageal reflux disease)   . Low back pain radiating to right leg   . Morbid obesity ([place]) 07/17/2011  . Hypothyroidism 07/17/2011  . Hyperlipidemia 07/17/2011  . Essential hypertension 07/17/2011    Past Medical  History:  Diagnosis Date  . Abnormal nuclear stress test 08/23/2015  . Arthritis   . Chicken pox as a child  . Essential hypertension 07/17/2011   ACEI d/c 06/2011 for refractory cough> resolved   . GERD (gastroesophageal reflux disease)   . Hyperlipidemia   . Hypertension   . Hypothyroidism   . Insomnia 11/01/2014  . Low back pain radiating to right leg    intermittent and occasional numbness of skin over right hip in certain positions  . Measles as a child  . Mumps as a child  . Obesity   . Pneumonia    this October from which she has said inhailer  . Recurrent epistaxis 02/20/2012  . Rheumatoid arthritis ([place]) 09/10/2011   Bone on bone in knees Pain in L>R knees.   . RLS (restless legs syndrome) 11/01/2014    Family History  Problem Relation Age of Onset  . Lung cancer Mother 52    smoker  . Heart Problems Mother     tachycardia  . Hearing loss Mother   . Anxiety disorder Mother     anxiety, claustrophobia  . Osteoarthritis Father   . Hyperlipidemia Father   . Hyperlipidemia Sister   . Migraines Sister   . Hypertension Sister   . Colon cancer Maternal Grandmother   . Heart attack Maternal Grandfather   . Aneurysm Paternal Grandfather     abdominal  . Heart disease Paternal Grandfather     AAA rupture, smoker  . Anuerysm Father     AAA  . Heart Problems Sister     tachycardia   Past Surgical History:  Procedure Laterality Date  .  CARDIAC CATHETERIZATION N/A 08/23/2015   Procedure: Left Heart Cath and Coronary Angiography;  Surgeon: [name], MD;  Location: [place] [facility];  Service: Cardiovascular;  Laterality: N/A;  . CESAREAN SECTION     X 3  . INGUINAL HERNIA REPAIR Right 66 yrs old  . THYROIDECTOMY     total for benign tumor, Parathyroid spared  . TONSILLECTOMY    . TOTAL HIP ARTHROPLASTY Right 2006   secondary to congenital  hip defect   Social History   Social History Narrative  . No narrative on file     Objective: Vital Signs: There  were no vitals taken for this visit.   Physical Exam   Musculoskeletal Exam: ***  CDAI Exam: No CDAI exam completed.    Investigation: Findings:  In March 2017, CBC and comprehensive metabolic panel were normal.  ANA, rheumatoid factor, and CCP were negative.  She had x-rays in September 2016 per the patient which showed bone-on-bone arthritis.     Office Visit on 05/29/2016  Component Date Value Ref Range Status  . TSH 05/29/2016 1.90  0.35 - 4.50 uIU/mL Final  . WBC 05/29/2016 11.8* 4.0 - 10.5 K/uL Final  . RBC 05/29/2016 4.12  3.87 - 5.11 Mil/uL Final  . Platelets 05/29/2016 452.0* 150.0 - 400.0 K/uL Final  . Hemoglobin 05/29/2016 11.2* 12.0 - 15.0 g/dL Final  . HCT 05/29/2016 34.5* 36.0 - 46.0 % Final  . MCV 05/29/2016 83.7  78.0 - 100.0 fl Final  . MCHC 05/29/2016 32.5  30.0 - 36.0 g/dL Final  . RDW 05/29/2016 14.1  11.5 - 15.5 % Final  . Sodium 05/29/2016 139  135 - 145 mEq/L Final  . Potassium 05/29/2016 4.1  3.5 - 5.1 mEq/L Final  . Chloride 05/29/2016 102  96 - 112 mEq/L Final  . CO2 05/29/2016 29  19 - 32 mEq/L Final  . Glucose, Bld 05/29/2016 83  70 - 99 mg/dL Final  . BUN 05/29/2016 21  6 - 23 mg/dL Final  . Creatinine, Ser 05/29/2016 0.85  0.40 - 1.20 mg/dL Final  . Total Bilirubin 05/29/2016 0.4  0.2 - 1.2 mg/dL Final  . Alkaline Phosphatase 05/29/2016 97  39 - 117 U/L Final  . AST 05/29/2016 16  0 - 37 U/L Final  . ALT 05/29/2016 12  0 - 35 U/L Final  . Total Protein 05/29/2016 6.7  6.0 - 8.3 g/dL Final  . Albumin 05/29/2016 3.8  3.5 - 5.2 g/dL Final  . Calcium 05/29/2016 9.3  8.4 - 10.5 mg/dL Final  . GFR 05/29/2016 71.14  >60.00 mL/min Final  . Cholesterol 05/29/2016 196  0 - 200 mg/dL Final  . Triglycerides 05/29/2016 172.0* 0.0 - 149.0 mg/dL Final  . HDL 05/29/2016 73.60  >39.00 mg/dL Final  . VLDL 05/29/2016 34.4  0.0 - 40.0 mg/dL Final  . LDL Cholesterol 05/29/2016 88  0 - 99 mg/dL Final  . Total CHOL/HDL Ratio 05/29/2016 3   Final  . NonHDL  05/29/2016 122.44   Final     Imaging: No results found.  Speciality Comments: No specialty comments available.    Procedures:  No procedures performed Allergies: Accupril [quinapril hcl]   Assessment / Plan:     Visit Diagnoses: No diagnosis found.    Orders: No orders of the defined types were placed in this encounter.  No orders of the defined types were placed in this encounter.   Face-to-face time spent with patient was *** minutes. 50% of time was spent  in counseling and coordination of care.  Follow-Up Instructions: No Follow-up on file.   Karalee Hauter, [place]  Note - This record has been created using [employer].  Chart creation errors have been sought, but may not always  have been located. Such creation errors do not reflect on  the standard of medical care.

## 2016-10-03 NOTE — Progress Notes (Signed)
Office Visit Note  Patient: Rebecca Brooks             Date of Birth: 1951-05-26           MRN: [id number]             PCP: Penni Homans, MD Referring: Mosie Lukes, MD Visit Date: 10/03/2016 Occupation: @GUAROCC @    Subjective:  Follow-up BOTH KNEE HURT BOTH HANDS HURT  History of Present Illness: Rebecca Brooks is a 66 y.o. female  [place].  DID WELL W HYALGAN FINISHED IN OCT 2017. REQUESTING REPEAT SERIES. WE WILL APPLY FOR HYALGAN BOTH KNEES X 5   Activities of Daily Living:  Patient reports morning stiffness for 15 minutes.   Patient Reports nocturnal pain.  Difficulty dressing/grooming: Reports Difficulty climbing stairs: Reports Difficulty getting out of chair: Reports Difficulty using hands for taps, buttons, cutlery, and/or writing: Reports   Review of Systems  Constitutional: Negative for fatigue.  HENT: Negative for mouth sores and mouth dryness.   Eyes: Negative for dryness.  Respiratory: Negative for shortness of breath.   Gastrointestinal: Negative for constipation and diarrhea.  Musculoskeletal: Negative for myalgias and myalgias.  Skin: Negative for sensitivity to sunlight.  Psychiatric/Behavioral: Negative for decreased concentration and sleep disturbance.    PMFS History:  Patient Active Problem List   Diagnosis Date Noted  . Primary osteoarthritis of both knees 10/03/2016  . DDD (degenerative disc disease), cervical 10/03/2016  . DDD (degenerative disc disease), lumbar 10/03/2016  . History of total hip replacement, right 10/03/2016  . History of hypothyroidism 10/03/2016  . History of hypertension 10/03/2016  . History of anemia 10/03/2016  . Chondromalacia 10/03/2016  . Primary osteoarthritis of shoulder 10/03/2016  . History of high cholesterol 10/03/2016  . Primary osteoarthritis of both hands 10/03/2016  . Abnormal nuclear stress test 08/23/2015  . Globus sensation 08/13/2015  . Special screening for  malignant neoplasms, colon 08/13/2015  . Chest pain at rest   . Chest pain with moderate risk of acute coronary syndrome 07/28/2015  . Annual physical exam 07/11/2015  . RLS (restless legs syndrome) 11/01/2014  . Insomnia 11/01/2014  . Thoracic back pain 01/12/2014  . Allergic rhinitis 11/03/2013  . Preventative health care 12/13/2012  . Recurrent epistaxis 02/20/2012  . Shoulder pain, bilateral 11/01/2011  . Arthralgia of both knees 10/18/2011  . Peripheral edema 09/10/2011  . Rheumatoid arthritis ([place]) 09/10/2011  . Asthma 08/19/2011  . Palpitations 08/01/2011  . Anemia 08/01/2011  . Hypokalemia 08/01/2011  . Elevated WBC count 08/01/2011  . Arthritis   . GERD (gastroesophageal reflux disease)   . Low back pain radiating to right leg   . Morbid obesity ([place]) 07/17/2011  . Hypothyroidism 07/17/2011  . Hyperlipidemia 07/17/2011  . Essential hypertension 07/17/2011    Past Medical History:  Diagnosis Date  . Abnormal nuclear stress test 08/23/2015  . Arthritis   . Chicken pox as a child  . Essential hypertension 07/17/2011   ACEI d/c 06/2011 for refractory cough> resolved   . GERD (gastroesophageal reflux disease)   . Hyperlipidemia   . Hypertension   . Hypothyroidism   . Insomnia 11/01/2014  . Low back pain radiating to right leg    intermittent and occasional numbness of skin over right hip in certain positions  . Measles as a child  . Mumps as a child  . Obesity   . Pneumonia    this October from which she has said inhailer  .  Recurrent epistaxis 02/20/2012  . Rheumatoid arthritis ([place]) 09/10/2011   Bone on bone in knees Pain in L>R knees.   . RLS (restless legs syndrome) 11/01/2014    Family History  Problem Relation Age of Onset  . Lung cancer Mother 41    smoker  . Heart Problems Mother     tachycardia  . Hearing loss Mother   . Anxiety disorder Mother     anxiety, claustrophobia  . Osteoarthritis Father   . Hyperlipidemia Father   . Hyperlipidemia Sister    . Migraines Sister   . Hypertension Sister   . Colon cancer Maternal Grandmother   . Heart attack Maternal Grandfather   . Aneurysm Paternal Grandfather     abdominal  . Heart disease Paternal Grandfather     AAA rupture, smoker  . Anuerysm Father     AAA  . Heart Problems Sister     tachycardia   Past Surgical History:  Procedure Laterality Date  . CARDIAC CATHETERIZATION N/A 08/23/2015   Procedure: Left Heart Cath and Coronary Angiography;  Surgeon: [name], MD;  Location: [place] [facility];  Service: Cardiovascular;  Laterality: N/A;  . CESAREAN SECTION     X 3  . INGUINAL HERNIA REPAIR Right 66 yrs old  . THYROIDECTOMY     total for benign tumor, Parathyroid spared  . TONSILLECTOMY    . TOTAL HIP ARTHROPLASTY Right 2006   secondary to congenital  hip defect   Social History   Social History Narrative  . No narrative on file     Objective: Vital Signs: BP (!) 173/85   Pulse 86   Resp 13   Ht 5\' 2"  (1.575 m)   Wt 298 lb (135.2 kg)   BMI 54.50 kg/m    Physical Exam  Constitutional: She is oriented to person, place, and time. She appears well-developed and well-nourished.  HENT:  Head: Normocephalic and atraumatic.  Eyes: EOM are normal. Pupils are equal, round, and reactive to light.  Cardiovascular: Normal rate, regular rhythm and normal heart sounds.  Exam reveals no gallop and no friction rub.   No murmur heard. Pulmonary/Chest: Effort normal and breath sounds normal. She has no wheezes. She has no rales.  Abdominal: Soft. Bowel sounds are normal. She exhibits no distension. There is no tenderness. There is no guarding. No hernia.  Musculoskeletal: Normal range of motion. She exhibits no edema, tenderness or deformity.  Lymphadenopathy:    She has no cervical adenopathy.  Neurological: She is alert and oriented to person, place, and time. Coordination normal.  Skin: Skin is warm and dry. Capillary refill takes less than 2 seconds. No rash noted.    Psychiatric: She has a normal mood and affect. Her behavior is normal.  Nursing note and vitals reviewed.    Musculoskeletal Exam:    CDAI Exam: CDAI Homunculus Exam:   Tenderness:  Right hand: 2nd PIP, 3rd PIP, 2nd DIP and 3rd DIP Left hand: 2nd PIP, 3rd PIP, 2nd DIP and 3rd DIP  Joint Counts:  CDAI Tender Joint count: 4 CDAI Swollen Joint count: 0  dip / pip joints w/ pain to bilateral 2nd and 3rd digits c/w/ OA.   Investigation: No additional findings.   Imaging: No results found.  Speciality Comments: No specialty comments available.    Procedures:  Large Joint Inj Date/Time: 10/03/2016 2:36 PM Performed by: Eliezer Lofts Authorized by: Eliezer Lofts   Consent Given by:  Patient Site marked: the procedure site was marked  Timeout: prior to procedure the correct patient, procedure, and site was verified   Indications:  Pain and joint swelling Location:  Knee Site:  R knee Prep: patient was prepped and draped in usual sterile fashion   Needle Size:  27 G Needle Length:  1.5 inches Approach:  Medial Ultrasound Guidance: No   Fluoroscopic Guidance: No   Arthrogram: No   Medications:  1.5 mL lidocaine 1 %; 30 mg triamcinolone acetonide 40 MG/ML Aspiration Attempted: Yes   Patient tolerance:  Patient tolerated the procedure well with no immediate complications  Right knee pain Needs to restart visco series Did well w/ hyalgan in fall of 2017.  We sent message to sharon to apply for hyalgan for both knees x 5 and will start series after 6 months from last injection in last series  Needs temporary relief.  Asks for steroid injection Since b.p. Is elevated, we will give her only 30 mg of kenalog. (pt states she forgot to take her HCTZ for b.p. Today).  Will make another appt for cortisone injection in the left knee in next 1-2 weeks.    Allergies: Accupril [quinapril hcl]   Assessment / Plan:     Visit Diagnoses: Arthralgia of both  knees  Primary osteoarthritis of both knees  Primary osteoarthritis of both hands - Plan: PR DURABLE MEDICAL EQUIPMENT MI  Pain in left hand - Plan: PR DURABLE MEDICAL EQUIPMENT MI  DDD (degenerative disc disease), cervical  DDD (degenerative disc disease), lumbar  History of total hip replacement, right  History of hypothyroidism  History of hypertension  Chondromalacia - patella  Primary osteoarthritis of shoulder, unspecified laterality   PLAN:  RIGHT KNEE PAIN INJECT W/ 30MG  KENALOG + 1 1/2 ML 1% LIDOCAINE.  SEE PROCEDURE NOTE.  HAND PAIN. ESPECIALLY 2ND AND 3RD FINGER AT DIP AND PIP JOINTS. MAY BENEFIT FROM RIGHT SPLINT FOR 2ND LEFT FINGER DIP AND PIP. I've given her prescription for this. She may also benefit from seeing a Copy. We will reserve this is an option for the patient if the finger splint does not helpful or the physical hand therapist feels that she may need evaluation from the hand surgeon.   Apply for Visco supplementation (hyalgan) both knees. Message sent by April to Ivin Booty Last injection series completed 05/03/2016  Return to clinic in 5 months for regular follow-up  Orders: Orders Placed This Encounter  Procedures  . Large Joint Injection/Arthrocentesis  . PR DURABLE MEDICAL EQUIPMENT MI   No orders of the defined types were placed in this encounter.   Face-to-face time spent with patient was 30 minutes. 50% of time was spent in counseling and coordination of care.  Follow-Up Instructions: No Follow-up on file.   Eliezer Lofts, PA-C  Note - This record has been created using [employer].  Chart creation errors have been sought, but may not always  have been located. Such creation errors do not reflect on  the standard of medical care.

## 2016-10-03 NOTE — Patient Instructions (Signed)

## 2016-10-03 NOTE — Progress Notes (Deleted)
   Procedure Note  Patient: Rebecca Brooks             Date of Birth: 1951-06-06           MRN: [id number]             Visit Date: 10/03/2016  Procedures: Visit Diagnoses: Arthralgia of both knees  Primary osteoarthritis of both knees  Primary osteoarthritis of both hands  DDD (degenerative disc disease), cervical  DDD (degenerative disc disease), lumbar  History of total hip replacement, right  History of hypothyroidism  History of hypertension  History of anemia  Chondromalacia - patella  Primary osteoarthritis of shoulder, unspecified laterality  History of high cholesterol  No procedures performed

## 2016-10-04 ENCOUNTER — Other Ambulatory Visit: Payer: Self-pay | Admitting: Family Medicine

## 2016-10-25 ENCOUNTER — Encounter: Payer: Self-pay | Admitting: Rheumatology

## 2016-10-25 ENCOUNTER — Ambulatory Visit (INDEPENDENT_AMBULATORY_CARE_PROVIDER_SITE_OTHER): Payer: Medicare Other | Admitting: Rheumatology

## 2016-10-25 VITALS — BP 153/87 | HR 74 | Resp 13

## 2016-10-25 DIAGNOSIS — M170 Bilateral primary osteoarthritis of knee: Secondary | ICD-10-CM

## 2016-10-25 DIAGNOSIS — G8929 Other chronic pain: Secondary | ICD-10-CM

## 2016-10-25 DIAGNOSIS — M1712 Unilateral primary osteoarthritis, left knee: Secondary | ICD-10-CM

## 2016-10-25 DIAGNOSIS — M25562 Pain in left knee: Secondary | ICD-10-CM

## 2016-10-25 MED ORDER — TRIAMCINOLONE ACETONIDE 40 MG/ML IJ SUSP
40.0000 mg | INTRAMUSCULAR | Status: AC | PRN
  Administered 2016-10-25: 40 mg via INTRA_ARTICULAR

## 2016-10-25 MED ORDER — LIDOCAINE HCL 1 % IJ SOLN
1.5000 mL | INTRAMUSCULAR | Status: AC | PRN
  Administered 2016-10-25: 1.5 mL

## 2016-10-25 NOTE — Progress Notes (Signed)
   Procedure Note  Patient: SHAWANA KNOCH             Date of Birth: 18-Oct-1950           MRN: [id number]             Visit Date: 10/25/2016  Procedures: Visit Diagnoses: No diagnosis found.  Large Joint Inj Date/Time: 10/25/2016 4:12 PM Performed by: Eliezer Lofts Authorized by: Eliezer Lofts   Consent Given by:  Patient Site marked: the procedure site was marked   Timeout: prior to procedure the correct patient, procedure, and site was verified   Indications:  Pain and joint swelling Location:  Knee Site:  L knee Prep: patient was prepped and draped in usual sterile fashion   Needle Size:  27 G Needle Length:  1.5 inches Approach:  Medial Ultrasound Guidance: No   Fluoroscopic Guidance: No   Arthrogram: No   Medications:  1.5 mL lidocaine 1 %; 40 mg triamcinolone acetonide 40 MG/ML Aspiration Attempted: Yes   Patient tolerance:  Patient tolerated the procedure well with no immediate complications  Patient presents today for cortisone injection to the left knee. Cortisone injections have helped the patient tremendously in the past and she is due for repeat cortisone injection today.

## 2016-11-03 ENCOUNTER — Telehealth: Payer: Self-pay | Admitting: Rheumatology

## 2016-11-03 NOTE — Telephone Encounter (Signed)
Patient coming by office to pick up office notes.

## 2016-11-03 NOTE — Telephone Encounter (Signed)
Kim from the [facility] called to ask if the patient's most recent office notes could be faxed to their office, as she has an appointment on 11/06/16 at 2:30pm. She is also requesting any rheumatology notes be sent too. Office tele# [phone] Fax number [phone]

## 2016-11-06 DIAGNOSIS — M19041 Primary osteoarthritis, right hand: Secondary | ICD-10-CM | POA: Diagnosis not present

## 2016-11-06 DIAGNOSIS — M180 Bilateral primary osteoarthritis of first carpometacarpal joints: Secondary | ICD-10-CM | POA: Diagnosis not present

## 2016-11-06 DIAGNOSIS — M79642 Pain in left hand: Secondary | ICD-10-CM | POA: Diagnosis not present

## 2016-11-06 DIAGNOSIS — M79641 Pain in right hand: Secondary | ICD-10-CM | POA: Diagnosis not present

## 2016-11-07 ENCOUNTER — Other Ambulatory Visit: Payer: Self-pay | Admitting: Orthopedic Surgery

## 2016-11-07 ENCOUNTER — Ambulatory Visit
Admission: RE | Admit: 2016-11-07 | Discharge: 2016-11-07 | Disposition: A | Discharge status: Home / Self Care | Payer: Medicare Other | Source: Ambulatory Visit | Attending: Orthopedic Surgery | Admitting: Orthopedic Surgery

## 2016-11-07 DIAGNOSIS — M542 Cervicalgia: Secondary | ICD-10-CM

## 2016-11-07 DIAGNOSIS — G5603 Carpal tunnel syndrome, bilateral upper limbs: Secondary | ICD-10-CM | POA: Diagnosis not present

## 2016-11-08 ENCOUNTER — Other Ambulatory Visit: Payer: Self-pay | Admitting: Family Medicine

## 2016-11-08 DIAGNOSIS — G5603 Carpal tunnel syndrome, bilateral upper limbs: Secondary | ICD-10-CM | POA: Diagnosis not present

## 2016-11-08 DIAGNOSIS — M542 Cervicalgia: Secondary | ICD-10-CM | POA: Diagnosis not present

## 2016-11-08 DIAGNOSIS — R52 Pain, unspecified: Secondary | ICD-10-CM | POA: Diagnosis not present

## 2016-11-08 DIAGNOSIS — M180 Bilateral primary osteoarthritis of first carpometacarpal joints: Secondary | ICD-10-CM | POA: Diagnosis not present

## 2016-11-14 ENCOUNTER — Encounter: Payer: Self-pay | Admitting: Family Medicine

## 2016-11-14 ENCOUNTER — Other Ambulatory Visit: Payer: Self-pay | Admitting: Family Medicine

## 2016-11-14 MED ORDER — ROPINIROLE HCL 0.5 MG PO TABS: 0.5000 mg | ORAL_TABLET | Freq: Every evening | ORAL | 0 refills | Status: DC | PRN

## 2016-11-14 MED ORDER — CETIRIZINE HCL 10 MG PO TABS: 10.0000 mg | ORAL_TABLET | Freq: Every day | ORAL | 6 refills | Status: DC

## 2016-11-20 DIAGNOSIS — Z713 Dietary counseling and surveillance: Secondary | ICD-10-CM | POA: Diagnosis not present

## 2016-11-20 DIAGNOSIS — Z6841 Body Mass Index (BMI) 40.0 and over, adult: Secondary | ICD-10-CM | POA: Diagnosis not present

## 2016-11-22 ENCOUNTER — Telehealth: Payer: Self-pay | Admitting: *Deleted

## 2016-11-22 NOTE — Telephone Encounter (Signed)
LM for patient to return call to schedule AWV.   

## 2016-11-27 ENCOUNTER — Other Ambulatory Visit: Payer: Self-pay | Admitting: Family Medicine

## 2016-11-27 ENCOUNTER — Encounter: Payer: Self-pay | Admitting: Family Medicine

## 2016-11-27 ENCOUNTER — Ambulatory Visit (INDEPENDENT_AMBULATORY_CARE_PROVIDER_SITE_OTHER): Payer: Medicare Other | Admitting: Family Medicine

## 2016-11-27 VITALS — BP 148/57 | HR 85 | Temp 97.9°F | Ht 62.0 in | Wt 306.8 lb

## 2016-11-27 DIAGNOSIS — E782 Mixed hyperlipidemia: Secondary | ICD-10-CM

## 2016-11-27 DIAGNOSIS — R32 Unspecified urinary incontinence: Secondary | ICD-10-CM

## 2016-11-27 DIAGNOSIS — R600 Localized edema: Secondary | ICD-10-CM

## 2016-11-27 DIAGNOSIS — E039 Hypothyroidism, unspecified: Secondary | ICD-10-CM | POA: Diagnosis not present

## 2016-11-27 DIAGNOSIS — R252 Cramp and spasm: Secondary | ICD-10-CM

## 2016-11-27 DIAGNOSIS — I10 Essential (primary) hypertension: Secondary | ICD-10-CM

## 2016-11-27 DIAGNOSIS — R601 Generalized edema: Secondary | ICD-10-CM | POA: Diagnosis not present

## 2016-11-27 DIAGNOSIS — M1990 Unspecified osteoarthritis, unspecified site: Secondary | ICD-10-CM | POA: Diagnosis not present

## 2016-11-27 DIAGNOSIS — R609 Edema, unspecified: Secondary | ICD-10-CM

## 2016-11-27 DIAGNOSIS — M5030 Other cervical disc degeneration, unspecified cervical region: Secondary | ICD-10-CM | POA: Diagnosis not present

## 2016-11-27 DIAGNOSIS — Z8679 Personal history of other diseases of the circulatory system: Secondary | ICD-10-CM

## 2016-11-27 DIAGNOSIS — R0602 Shortness of breath: Secondary | ICD-10-CM

## 2016-11-27 DIAGNOSIS — E876 Hypokalemia: Secondary | ICD-10-CM

## 2016-11-27 MED ORDER — FUROSEMIDE 20 MG PO TABS
ORAL_TABLET | ORAL | 3 refills | Status: DC
Start: 2016-11-27 — End: 2016-11-27

## 2016-11-27 MED ORDER — POTASSIUM CHLORIDE CRYS ER 20 MEQ PO TBCR: 20.0000 meq | EXTENDED_RELEASE_TABLET | Freq: Every day | ORAL | 3 refills | Status: DC | PRN

## 2016-11-27 MED ORDER — METOPROLOL TARTRATE 50 MG PO TABS: 50.0000 mg | ORAL_TABLET | Freq: Two times a day (BID) | ORAL | 3 refills | Status: DC

## 2016-11-27 NOTE — Assessment & Plan Note (Signed)
Worsening neck pain with b/l radiculopathy recent imaging at [facility] confirming severe degenerative changes. Has been referred to [facility]. Will proceed with [facility] referral at patient request

## 2016-11-27 NOTE — Assessment & Plan Note (Signed)
Increase Lasix to 40 mg daily x 5 days then decrease to 20 mg daily as needed, potassium KCL daily while on lasix, compression hose, minimize sodium and elevate feet.

## 2016-11-27 NOTE — Assessment & Plan Note (Signed)
Encouraged heart healthy diet, increase exercise, avoid trans fats, consider a krill oil cap daily 

## 2016-11-27 NOTE — Assessment & Plan Note (Signed)
Follows with weight loss clinic at [facility]

## 2016-11-27 NOTE — Assessment & Plan Note (Signed)
Well controlled, no changes to meds. Encouraged heart healthy diet such as the DASH diet and exercise as tolerated.  °

## 2016-11-27 NOTE — Progress Notes (Signed)
Pre visit review using our clinic review tool, if applicable. No additional management support is needed unless otherwise documented below in the visit note. 

## 2016-11-27 NOTE — Progress Notes (Signed)
Patient ID: DEONNA KRUMMEL, female   DOB: [birth year], 66 y.o.   MRN: [id number]   Subjective:  I acted as a Education administrator for Penni Homans, [place], [place]   Patient ID: [name], female    DOB: 1951/04/26, 66 y.o.   MRN: [id number]  Chief Complaint  Patient presents with  . Annual Exam  . Hyperlipidemia  . Hypertension  . Gastroesophageal Reflux  . Hypothyroidism    Hyperlipidemia  This is a chronic problem. The problem is controlled. Pertinent negatives include no chest pain or shortness of breath. Risk factors for coronary artery disease include obesity and hypertension.  Hypertension  This is a chronic problem. The problem has been waxing and waning since onset. The problem is controlled. Pertinent negatives include no blurred vision, chest pain, headaches, malaise/fatigue, palpitations or shortness of breath. Risk factors for coronary artery disease include obesity.  Gastroesophageal Reflux  She reports no chest pain or no coughing. This is a chronic problem.  Knee Pain      Patient is in today for an annual examination. Patient states that she has been experiencing some edema in both legs for the past 3 weeks. Also states that she has periodically taken Lasix to help with the edema. Also states that she was told by a Neck Specialist that she has 3 collapsed vertebrae in her neck. Patient also states that she is taking 3 Advil in the morning and 3 Advil at night. Patient has a Hx of HTN, GERD, knee pain, obesity, hyperlipidemia. Patient has no additional acute concerns noted at this time. No recent hospitalizations or acute febrile illnesses  Patient Care Team: Mosie Lukes, MD as PCP - General (Family Medicine)   Past Medical History:  Diagnosis Date  . Abnormal nuclear stress test 08/23/2015  . Arthritis   . Chicken pox as a child  . Essential hypertension 07/17/2011   ACEI d/c 06/2011 for refractory cough> resolved   . GERD (gastroesophageal reflux disease)   .  Hyperlipidemia   . Hypertension   . Hypothyroidism   . Insomnia 11/01/2014  . Low back pain radiating to right leg    intermittent and occasional numbness of skin over right hip in certain positions  . Measles as a child  . Mumps as a child  . Obesity   . Pneumonia    this October from which she has said inhailer  . Recurrent epistaxis 02/20/2012  . Rheumatoid arthritis ([place]) 09/10/2011   Bone on bone in knees Pain in L>R knees.   . RLS (restless legs syndrome) 11/01/2014    Past Surgical History:  Procedure Laterality Date  . CARDIAC CATHETERIZATION N/A 08/23/2015   Procedure: Left Heart Cath and Coronary Angiography;  Surgeon: [name], MD;  Location: [facility];  Service: Cardiovascular;  Laterality: N/A;  . CESAREAN SECTION     X 3  . INGUINAL HERNIA REPAIR Right 66 yrs old  . THYROIDECTOMY     total for benign tumor, Parathyroid spared  . TONSILLECTOMY    . TOTAL HIP ARTHROPLASTY Right 2006   secondary to congenital  hip defect    Family History  Problem Relation Age of Onset  . Lung cancer Mother 17    smoker  . Heart Problems Mother     tachycardia  . Hearing loss Mother   . Anxiety disorder Mother     anxiety, claustrophobia  . Osteoarthritis Father   . Hyperlipidemia Father   . Anuerysm Father  AAA  . Hyperlipidemia Sister   . Migraines Sister   . Hypertension Sister   . Colon cancer Maternal Grandmother   . Heart attack Maternal Grandfather   . Aneurysm Paternal Grandfather     abdominal  . Heart disease Paternal Grandfather     AAA rupture, smoker  . Heart Problems Sister     tachycardia    Social History   Social History  . Marital status: Married    Spouse name: N/A  . Number of children: 3  . 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 Smoking status: Former Smoker    Packs/day: 0.50    Years: 5.00    Types: Cigarettes    Quit date: 07/25/1971  . Smokeless  tobacco: Never Used  . Alcohol use 8.4 oz/week    14 Glasses of wine per week     Comment: socially  . Drug use: No  . Sexual activity: No     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   Other Topics Concern  . Not on file   Social History Narrative  . No narrative on file    Outpatient Medications Prior to Visit  Medication Sig Dispense Refill  . albuterol (PROAIR HFA) 108 (90 Base) MCG/ACT inhaler Inhale 2 puffs into the lungs every 6 (six) hours as needed for wheezing or shortness of breath. 8.5 Inhaler 6  . aspirin EC 81 MG tablet Take 1 tablet (81 mg total) by mouth daily.    Marland Kitchen buPROPion (WELLBUTRIN XL) 300 MG 24 hr tablet TK 1 T PO D  5  . cetirizine (ZYRTEC) 10 MG tablet Take 1 tablet (10 mg total) by mouth daily. 30 tablet 6  . diclofenac sodium (VOLTAREN) 1 % GEL APPLY 4 GRAMS TO 3 LARGE JOINTS TID PRN  2  . famotidine (PEPCID) 20 MG tablet Take 1 tablet (20 mg total) by mouth at bedtime. 90 tablet 2  . fluticasone (FLONASE) 50 MCG/ACT nasal spray SHAKE LIQUID AND USE 2 SPRAYS IN EACH NOSTRIL DAILY 16 g 1  . hydrochlorothiazide (HYDRODIURIL) 25 MG tablet TAKE 1 TABLET BY MOUTH DAILY 90 tablet 0  . hyoscyamine (LEVSIN SL) 0.125 MG SL tablet Place 1 tablet (0.125 mg total) under the tongue every 4 (four) hours as needed. 30 tablet 0  . levothyroxine (SYNTHROID, LEVOTHROID) 100 MCG tablet TAKE 1 TABLET BY MOUTH DAILY BEFORE BREAKFAST 90 tablet 0  . MELATONIN PO Take 1 tablet by mouth at bedtime as needed (sleep).    . naltrexone (DEPADE) 50 MG tablet TK 1 AND 1/2 TS PO D  3  . nitroGLYCERIN (NITROSTAT) 0.4 MG SL tablet Place 1 tablet (0.4 mg total) under the tongue every 5 (five) minutes as needed for chest pain. 25 tablet 3  . pantoprazole (PROTONIX) 40 MG tablet TAKE 1 TABLET(40 MG) BY MOUTH DAILY 30 tablet 0  . pantoprazole (PROTONIX) 40 MG tablet TAKE 1 TABLET BY MOUTH DAILY 30 tablet 5  . rOPINIRole (REQUIP) 0.5 MG tablet Take 1-2 tablets (0.5-1 mg total)  by mouth at bedtime as needed. 180 tablet 0  . simvastatin (ZOCOR) 40 MG tablet TAKE 1 TABLET BY MOUTH EVERY EVENING 90 tablet 0  . Vitamin D, Cholecalciferol, 1000 units CAPS TAKE 1 CAPSULE BY MOUTH EVERY DAY 100 capsule 0  . furosemide (LASIX) 20 MG tablet Take 1 tablet (20 mg total) by mouth daily as  needed. 1 tablet daily as needed 30 tablet 3  . metoprolol tartrate (LOPRESSOR) 25 MG tablet Take 1 tablet (25 mg total) by mouth 2 (two) times daily. 180 tablet 1  . potassium chloride SA (K-DUR,KLOR-CON) 20 MEQ tablet Take 1 tablet (20 mEq total) by mouth daily as needed (lasix use). 30 tablet 3  . zolpidem (AMBIEN) 10 MG tablet Take 1 tablet (10 mg total) by mouth at bedtime as needed for sleep. 30 tablet 0  . hydrochlorothiazide (HYDRODIURIL) 25 MG tablet Take 1 tablet (25 mg total) by mouth daily. (Patient not taking: Reported on 11/27/2016) 30 tablet 3  . hydrochlorothiazide (HYDRODIURIL) 25 MG tablet TAKE 1 TABLET BY MOUTH DAILY (Patient not taking: Reported on 11/27/2016) 30 tablet 6  . traMADol (ULTRAM) 50 MG tablet TAKE 1-2 TABLETS BY MOUTH EVERY 8 HOURS AS NEEDED (Patient not taking: Reported on 11/27/2016) 90 tablet 0  . DOBUTamine (DOBUTREX) 1,000 mcg/mL in dextrose 5% 250 mL infusion      No facility-administered medications prior to visit.     Allergies  Allergen Reactions  . Bee Venom Anaphylaxis  . Accupril [Quinapril Hcl] Cough    Review of Systems  Constitutional: Negative for fever and malaise/fatigue.  HENT: Negative for congestion.   Eyes: Negative for blurred vision.  Respiratory: Negative for cough and shortness of breath.   Cardiovascular: Negative for chest pain, palpitations and leg swelling.  Gastrointestinal: Negative for vomiting.  Musculoskeletal: Positive for joint pain. Negative for back pain.       Knee pain.  Skin: Negative for rash.  Neurological: Negative for loss of consciousness and headaches.       Objective:    Physical Exam  Constitutional: She  is oriented to person, place, and time. She appears well-developed and well-nourished. No distress.  HENT:  Head: Normocephalic and atraumatic.  Eyes: Conjunctivae are normal.  Neck: Normal range of motion. No thyromegaly present.  Cardiovascular: Normal rate and regular rhythm.   Pulmonary/Chest: Effort normal and breath sounds normal. She has no wheezes.  Abdominal: Soft. Bowel sounds are normal. There is no tenderness.  Musculoskeletal: She exhibits no edema or deformity.  Neurological: She is alert and oriented to person, place, and time.  Skin: Skin is warm and dry. She is not diaphoretic.  Psychiatric: She has a normal mood and affect.    BP (!) 148/57 (BP Location: Left Wrist, Patient Position: Sitting, Cuff Size: Normal)   Pulse 85   Temp 97.9 F (36.6 C) (Oral)   Ht 5\' 2"  (1.575 m)   Wt (!) 306 lb 12.8 oz (139.2 kg)   SpO2 98% Comment: RA  BMI 56.11 kg/m  Wt Readings from Last 3 Encounters:  11/27/16 (!) 306 lb 12.8 oz (139.2 kg)  10/03/16 298 lb (135.2 kg)  05/29/16 291 lb 4 oz (132.1 kg)   BP Readings from Last 3 Encounters:  11/27/16 (!) 148/57  10/25/16 (!) 153/87  10/03/16 (!) 173/85     Immunization History  Administered Date(s) Administered  . Influenza Split 07/19/2012  . Influenza Whole 06/24/2011, 05/13/2013  . Influenza-Unspecified 05/07/2014, 06/08/2015  . Pneumococcal Polysaccharide-23 07/18/2011  . Td 07/24/2005  . Tdap 08/01/2011  . Zoster 07/23/2012    Health Maintenance  Topic Date Due  . Hepatitis C Screening  Nov 27, 1950  . COLONOSCOPY  07/25/2015  . PNA vac Low Risk Adult (1 of 2 - PCV13) 02/27/2017 (Originally 07/01/2015)  . INFLUENZA VACCINE  07/09/2017 (Originally 02/21/2017)  . MAMMOGRAM  02/21/2017  . TETANUS/TDAP  07/31/2021  . DEXA SCAN  Completed    Lab Results  Component Value Date   WBC 11.8 (H) 05/29/2016   HGB 11.2 (L) 05/29/2016   HCT 34.5 (L) 05/29/2016   PLT 452.0 (H) 05/29/2016   GLUCOSE 83 05/29/2016   CHOL 196  05/29/2016   TRIG 172.0 (H) 05/29/2016   HDL 73.60 05/29/2016   LDLCALC 88 05/29/2016   ALT 12 05/29/2016   AST 16 05/29/2016   NA 139 05/29/2016   K 4.1 05/29/2016   CL 102 05/29/2016   CREATININE 0.85 05/29/2016   BUN 21 05/29/2016   CO2 29 05/29/2016   TSH 1.90 05/29/2016   INR 0.99 08/19/2015    Lab Results  Component Value Date   TSH 1.90 05/29/2016   Lab Results  Component Value Date   WBC 11.8 (H) 05/29/2016   HGB 11.2 (L) 05/29/2016   HCT 34.5 (L) 05/29/2016   MCV 83.7 05/29/2016   PLT 452.0 (H) 05/29/2016   Lab Results  Component Value Date   NA 139 05/29/2016   K 4.1 05/29/2016   CO2 29 05/29/2016   GLUCOSE 83 05/29/2016   BUN 21 05/29/2016   CREATININE 0.85 05/29/2016   BILITOT 0.4 05/29/2016   ALKPHOS 97 05/29/2016   AST 16 05/29/2016   ALT 12 05/29/2016   PROT 6.7 05/29/2016   ALBUMIN 3.8 05/29/2016   CALCIUM 9.3 05/29/2016   ANIONGAP 12 08/11/2015   GFR 71.14 05/29/2016   Lab Results  Component Value Date   CHOL 196 05/29/2016   Lab Results  Component Value Date   HDL 73.60 05/29/2016   Lab Results  Component Value Date   LDLCALC 88 05/29/2016   Lab Results  Component Value Date   TRIG 172.0 (H) 05/29/2016   Lab Results  Component Value Date   CHOLHDL 3 05/29/2016   No results found for: HGBA1C       Assessment & Plan:   Problem List Items Addressed This Visit    Morbid obesity ([place])    Follows with weight loss clinic at [facility]      Relevant Orders   Comprehensive metabolic panel   Hypothyroidism    On Levothyroxine, continue to monitor      Relevant Medications   metoprolol (LOPRESSOR) 50 MG tablet   Other Relevant Orders   Comprehensive metabolic panel   Hyperlipidemia    Encouraged heart healthy diet, increase exercise, avoid trans fats, consider a krill oil cap daily      Relevant Medications   metoprolol (LOPRESSOR) 50 MG tablet   Other Relevant Orders   Comprehensive metabolic panel   Essential  hypertension    Well controlled, no changes to meds. Encouraged heart healthy diet such as the DASH diet and exercise as tolerated.       Relevant Medications   metoprolol (LOPRESSOR) 50 MG tablet   Other Relevant Orders   CBC   Comprehensive metabolic panel   Lipid panel   TSH   ECHOCARDIOGRAM COMPLETE   Arthritis   Relevant Orders   Comprehensive metabolic panel   Ambulatory referral to Neurosurgery   Hypokalemia    With calf pain, check electrolytes and monitor      Peripheral edema    Increase Lasix to 40 mg daily x 5 days then decrease to 20 mg daily as needed, potassium KCL daily while on lasix, compression hose, minimize sodium and elevate feet.      Relevant Orders   Comprehensive metabolic panel  DDD (degenerative disc disease), cervical    Worsening neck pain with b/l radiculopathy recent imaging at [facility] confirming severe degenerative changes. Has been referred to [facility]. Will proceed with [facility] referral at patient request      Relevant Orders   Comprehensive metabolic panel   Ambulatory referral to Neurosurgery   History of hypertension - Primary   Relevant Orders   Comprehensive metabolic panel    Other Visit Diagnoses    Edema, unspecified type       Relevant Orders   Comprehensive metabolic panel   Leg cramps       Relevant Orders   Comprehensive metabolic panel   Generalized edema       Relevant Orders   Comprehensive metabolic panel   SOB (shortness of breath)       Relevant Orders   Comprehensive metabolic panel   ECHOCARDIOGRAM COMPLETE   Muscle cramps       Relevant Orders   Magnesium   Urinary incontinence, unspecified type       Relevant Orders   Urine Culture   Urinalysis      I have discontinued Ms. Cheslock's potassium chloride SA, furosemide, traMADol, and metoprolol tartrate. I am also having her start on metoprolol. Additionally, I am having her maintain her aspirin EC, nitroGLYCERIN, hyoscyamine, MELATONIN PO,  hydrochlorothiazide, albuterol, zolpidem, pantoprazole, fluticasone, famotidine, levothyroxine, simvastatin, buPROPion, diclofenac sodium, naltrexone, pantoprazole, Vitamin D (Cholecalciferol), rOPINIRole, and cetirizine. We will stop administering DOBUTamine.  Meds ordered this encounter  Medications  . DISCONTD: potassium chloride SA (K-DUR,KLOR-CON) 20 MEQ tablet    Sig: Take 1 tablet (20 mEq total) by mouth daily as needed (lasix use).    Dispense:  30 tablet    Refill:  3  . DISCONTD: furosemide (LASIX) 20 MG tablet    Sig: 2 tablets daily x 5 days then decrease to 1 tab po daily as needed    Dispense:  30 tablet    Refill:  3  . metoprolol (LOPRESSOR) 50 MG tablet    Sig: Take 1 tablet (50 mg total) by mouth 2 (two) times daily.    Dispense:  60 tablet    Refill:  3    CMA served as scribe during this visit. History, Physical and Plan performed by medical provider. Documentation and orders reviewed and attested to.  Penni Homans, MD

## 2016-11-27 NOTE — Assessment & Plan Note (Signed)
With calf pain, check electrolytes and monitor

## 2016-11-27 NOTE — Assessment & Plan Note (Signed)
On Levothyroxine, continue to monitor 

## 2016-11-27 NOTE — Patient Instructions (Signed)
Preventive Care 67 Years and Older, Female Preventive care refers to lifestyle choices and visits with your health care provider that can promote health and wellness. What does preventive care include?  A yearly physical exam. This is also called an annual well check.  Dental exams once or twice a year.  Routine eye exams. Ask your health care provider how often you should have your eyes checked.  Personal lifestyle choices, including:  Daily care of your teeth and gums.  Regular physical activity.  Eating a healthy diet.  Avoiding tobacco and drug use.  Limiting alcohol use.  Practicing safe sex.  Taking low-dose aspirin every day.  Taking vitamin and mineral supplements as recommended by your health care provider. What happens during an annual well check? The services and screenings done by your health care provider during your annual well check will depend on your age, overall health, lifestyle risk factors, and family history of disease. Counseling  Your health care provider may ask you questions about your:  Alcohol use.  Tobacco use.  Drug use.  Emotional well-being.  Home and relationship well-being.  Sexual activity.  Eating habits.  History of falls.  Memory and ability to understand (cognition).  Work and work environment.  Reproductive health. Screening  You may have the following tests or measurements:  Height, weight, and BMI.  Blood pressure.  Lipid and cholesterol levels. These may be checked every 5 years, or more frequently if you are over 50 years old.  Skin check.  Lung cancer screening. You may have this screening every year starting at age 55 if you have a 30-pack-year history of smoking and currently smoke or have quit within the past 15 years.  Fecal occult blood test (FOBT) of the stool. You may have this test every year starting at age 50.  Flexible sigmoidoscopy or colonoscopy. You may have a sigmoidoscopy every 5 years or  a colonoscopy every 10 years starting at age 50.  Hepatitis C blood test.  Hepatitis B blood test.  Sexually transmitted disease (STD) testing.  Diabetes screening. This is done by checking your blood sugar (glucose) after you have not eaten for a while (fasting). You may have this done every 1-3 years.  Bone density scan. This is done to screen for osteoporosis. You may have this done starting at age 67.  Mammogram. This may be done every 1-2 years. Talk to your health care provider about how often you should have regular mammograms. Talk with your health care provider about your test results, treatment options, and if necessary, the need for more tests. Vaccines  Your health care provider may recommend certain vaccines, such as:  Influenza vaccine. This is recommended every year.  Tetanus, diphtheria, and acellular pertussis (Tdap, Td) vaccine. You may need a Td booster every 10 years.  Varicella vaccine. You may need this if you have not been vaccinated.  Zoster vaccine. You may need this after age 60.  Measles, mumps, and rubella (MMR) vaccine. You may need at least one dose of MMR if you were born in 1957 or later. You may also need a second dose.  Pneumococcal 13-valent conjugate (PCV13) vaccine. One dose is recommended after age 67.  Pneumococcal polysaccharide (PPSV23) vaccine. One dose is recommended after age 67.  Meningococcal vaccine. You may need this if you have certain conditions.  Hepatitis A vaccine. You may need this if you have certain conditions or if you travel or work in places where you may be exposed to   hepatitis A.  Hepatitis B vaccine. You may need this if you have certain conditions or if you travel or work in places where you may be exposed to hepatitis B.  Haemophilus influenzae type b (Hib) vaccine. You may need this if you have certain conditions. Talk to your health care provider about which screenings and vaccines you need and how often you need  them. This information is not intended to replace advice given to you by your health care provider. Make sure you discuss any questions you have with your health care provider. Document Released: 08/06/2015 Document Revised: 03/29/2016 Document Reviewed: 05/11/2015 Elsevier Interactive Patient Education  2017 [employer]vier Inc.  

## 2016-11-28 ENCOUNTER — Other Ambulatory Visit: Payer: Self-pay | Admitting: Family Medicine

## 2016-11-28 DIAGNOSIS — R635 Abnormal weight gain: Secondary | ICD-10-CM | POA: Diagnosis not present

## 2016-11-28 DIAGNOSIS — D72829 Elevated white blood cell count, unspecified: Secondary | ICD-10-CM

## 2016-11-28 DIAGNOSIS — E669 Obesity, unspecified: Secondary | ICD-10-CM | POA: Diagnosis not present

## 2016-11-28 DIAGNOSIS — I10 Essential (primary) hypertension: Secondary | ICD-10-CM | POA: Diagnosis not present

## 2016-11-28 DIAGNOSIS — E785 Hyperlipidemia, unspecified: Secondary | ICD-10-CM | POA: Diagnosis not present

## 2016-11-28 LAB — COMPREHENSIVE METABOLIC PANEL
ALT: 17 U/L (ref 0–35)
AST: 20 U/L (ref 0–37)
Albumin: 3.8 g/dL (ref 3.5–5.2)
Alkaline Phosphatase: 88 U/L (ref 39–117)
BUN: 26 mg/dL — ABNORMAL HIGH (ref 6–23)
CHLORIDE: 103 meq/L (ref 96–112)
CO2: 28 meq/L (ref 19–32)
CREATININE: 0.82 mg/dL (ref 0.40–1.20)
Calcium: 9.5 mg/dL (ref 8.4–10.5)
GFR: 74.04 mL/min (ref >60.00)
GLUCOSE: 83 mg/dL (ref 70–99)
Potassium: 5.1 mEq/L (ref 3.5–5.1)
SODIUM: 139 meq/L (ref 135–145)
Total Bilirubin: 0.3 mg/dL (ref 0.2–1.2)
Total Protein: 6.7 g/dL (ref 6.0–8.3)

## 2016-11-28 LAB — MAGNESIUM: Magnesium: 2 mg/dL (ref 1.5–2.5)

## 2016-11-28 LAB — CBC
HCT: 35.9 % — ABNORMAL LOW (ref 36.0–46.0)
Hemoglobin: 11.6 g/dL — ABNORMAL LOW (ref 12.0–15.0)
MCHC: 32.2 g/dL (ref 30.0–36.0)
MCV: 84.2 fl (ref 78.0–100.0)
Platelets: 460 10*3/uL — ABNORMAL HIGH (ref 150.0–400.0)
RBC: 4.27 Mil/uL (ref 3.87–5.11)
RDW: 15.4 % (ref 11.5–15.5)
WBC: 14.7 10*3/uL — ABNORMAL HIGH (ref 4.0–10.5)

## 2016-11-28 LAB — URINALYSIS
BILIRUBIN URINE: NEGATIVE
Hgb urine dipstick: NEGATIVE
LEUKOCYTES UA: NEGATIVE
Nitrite: NEGATIVE
SPECIFIC GRAVITY, URINE: 1.02 (ref 1.000–1.030)
Total Protein, Urine: NEGATIVE
UROBILINOGEN UA: 0.2 (ref 0.0–1.0)
Urine Glucose: NEGATIVE
pH: 6 (ref 5.0–8.0)

## 2016-11-28 LAB — LIPID PANEL
Cholesterol: 178 mg/dL (ref 0–200)
HDL: 67.3 mg/dL (ref >39.00)
LDL CALC: 84 mg/dL (ref 0–99)
NonHDL: 110.28
TRIGLYCERIDES: 132 mg/dL (ref 0.0–149.0)
Total CHOL/HDL Ratio: 3
VLDL: 26.4 mg/dL (ref 0.0–40.0)

## 2016-11-28 LAB — TSH: TSH: 2.02 u[IU]/mL (ref 0.35–4.50)

## 2016-11-29 LAB — URINE CULTURE

## 2016-12-04 ENCOUNTER — Other Ambulatory Visit (INDEPENDENT_AMBULATORY_CARE_PROVIDER_SITE_OTHER): Payer: Medicare Other

## 2016-12-04 DIAGNOSIS — D72829 Elevated white blood cell count, unspecified: Secondary | ICD-10-CM

## 2016-12-05 ENCOUNTER — Other Ambulatory Visit: Payer: Self-pay | Admitting: Family Medicine

## 2016-12-05 DIAGNOSIS — D75839 Thrombocytosis, unspecified: Secondary | ICD-10-CM

## 2016-12-05 DIAGNOSIS — D473 Essential (hemorrhagic) thrombocythemia: Secondary | ICD-10-CM

## 2016-12-05 DIAGNOSIS — D72829 Elevated white blood cell count, unspecified: Secondary | ICD-10-CM

## 2016-12-05 DIAGNOSIS — D649 Anemia, unspecified: Secondary | ICD-10-CM

## 2016-12-05 LAB — CBC WITH DIFFERENTIAL/PLATELET
BASOS ABS: 0.2 10*3/uL — AB (ref 0.0–0.1)
Basophils Relative: 1 % (ref 0.0–3.0)
EOS ABS: 0.1 10*3/uL (ref 0.0–0.7)
Eosinophils Relative: 0.9 % (ref 0.0–5.0)
HEMATOCRIT: 37 % (ref 36.0–46.0)
HEMOGLOBIN: 11.8 g/dL — AB (ref 12.0–15.0)
LYMPHS PCT: 14.8 % (ref 12.0–46.0)
Lymphs Abs: 2.4 10*3/uL (ref 0.7–4.0)
MCHC: 31.8 g/dL (ref 30.0–36.0)
MCV: 83.9 fl (ref 78.0–100.0)
MONO ABS: 1.1 10*3/uL — AB (ref 0.1–1.0)
Monocytes Relative: 6.9 % (ref 3.0–12.0)
NEUTROS ABS: 12.2 10*3/uL — AB (ref 1.4–7.7)
Neutrophils Relative %: 76.4 % (ref 43.0–77.0)
PLATELETS: 504 10*3/uL — AB (ref 150.0–400.0)
RBC: 4.41 Mil/uL (ref 3.87–5.11)
RDW: 14.9 % (ref 11.5–15.5)
WBC: 15.9 10*3/uL — AB (ref 4.0–10.5)

## 2016-12-06 ENCOUNTER — Ambulatory Visit (HOSPITAL_BASED_OUTPATIENT_CLINIC_OR_DEPARTMENT_OTHER)
Admission: RE | Admit: 2016-12-06 | Discharge: 2016-12-06 | Disposition: A | Discharge status: Home / Self Care | Payer: Medicare Other | Source: Ambulatory Visit | Attending: Family Medicine | Admitting: Family Medicine

## 2016-12-06 DIAGNOSIS — I10 Essential (primary) hypertension: Secondary | ICD-10-CM | POA: Diagnosis not present

## 2016-12-06 DIAGNOSIS — R0602 Shortness of breath: Secondary | ICD-10-CM | POA: Diagnosis not present

## 2016-12-06 DIAGNOSIS — I071 Rheumatic tricuspid insufficiency: Secondary | ICD-10-CM | POA: Insufficient documentation

## 2016-12-06 LAB — ECHOCARDIOGRAM COMPLETE
AVLVOTPG: 6 mmHg
Ao-asc: 29 cm
CHL CUP DOP CALC LVOT VTI: 25.3 cm
CHL CUP LV S' LATERAL: 6.96 cm/s
CHL CUP LVOT MV VTI INDEX: 0.75 cm2/m2
CHL CUP MV DEC (S): 187
CHL CUP MV M VEL: 78.2
CHL CUP STROKE VOLUME: 53 mL
E decel time: 187 msec
EERAT: 14.44
FS: 28 % (ref 28–44)
IV/PV OW: 1.02
LA ID, A-P, ES: 37 mm
LA vol index: 24.6 mL/m2
LA vol: 56.4 mL
LADIAMINDEX: 1.62 cm/m2
LAVOLA4C: 71.8 mL
LEFT ATRIUM END SYS DIAM: 37 mm
LV E/e' medial: 14.44
LV E/e'average: 14.44
LV SIMPSON'S DISK: 74
LV dias vol index: 31 mL/m2
LV e' LATERAL: 8.38 cm/s
LV sys vol index: 8 mL/m2
LV sys vol: 19 mL (ref 14–42)
LVDIAVOL: 72 mL (ref 46–106)
LVOT SV: 72 mL
LVOT area: 2.84 cm2
LVOT diameter: 19 mm
LVOT peak vel: 126 cm/s
MV Annulus VTI: 41.8 cm
MVG: 3 mmHg
MVPG: 6 mmHg
MVPKAVEL: 103 m/s
MVPKEVEL: 121 m/s
PW: 14.1 mm — AB (ref 0.6–1.1)
RV LATERAL S' VELOCITY: 10.7 cm/s
RV sys press: 37 mmHg
Reg peak vel: 268 cm/s
TAPSE: 21.7 mm
TDI e' lateral: 8.38
TDI e' medial: 6.42
TRMAXVEL: 268 cm/s

## 2016-12-06 MED ORDER — PERFLUTREN LIPID MICROSPHERE
1.0000 mL | INTRAVENOUS | Status: AC | PRN
  Administered 2016-12-06: 2 mL via INTRAVENOUS
  Filled 2016-12-06: qty 10

## 2016-12-06 NOTE — ED Notes (Signed)
IV inserted by ED nurse in left forearm around 915am and Definity was administered. The IV was removed by the echo tech at 948NI with no complications.

## 2016-12-06 NOTE — Progress Notes (Signed)
  Echocardiogram 2D Echocardiogram has been performed.  Bobbye Charleston 12/06/2016, 9:52 AM

## 2016-12-07 ENCOUNTER — Encounter: Payer: Self-pay | Admitting: Family Medicine

## 2016-12-11 ENCOUNTER — Encounter: Payer: Self-pay | Admitting: Family Medicine

## 2016-12-11 ENCOUNTER — Other Ambulatory Visit: Payer: Self-pay

## 2016-12-11 ENCOUNTER — Other Ambulatory Visit: Payer: Self-pay | Admitting: Family Medicine

## 2016-12-11 MED ORDER — TRAMADOL HCL 50 MG PO TABS: ORAL_TABLET | ORAL | 0 refills | Status: DC

## 2016-12-11 NOTE — Telephone Encounter (Signed)
Printed hardcopy/contract Called the patient informed to pickup hardcopy/do uds/contract. She did verbalized understanding/and agreed to do. But will wait until she has her hematology appt and get prescription on the same day.

## 2016-12-11 NOTE — Telephone Encounter (Signed)
OK to refill Tramadol but needs uds and contract

## 2016-12-11 NOTE — Telephone Encounter (Signed)
Requesting:   tramadol Contract   none UDS   none Last OV    11/27/16 Last Refill    #90 no refills on 09/15/2016  Please Advise----[facility] fax number [phone]

## 2016-12-12 ENCOUNTER — Telehealth (INDEPENDENT_AMBULATORY_CARE_PROVIDER_SITE_OTHER): Payer: Self-pay | Admitting: Radiology

## 2016-12-12 NOTE — Telephone Encounter (Signed)
Message  Received: Wilburn Mylar  Message Contents  Jamariyah Johannsen, RT  Elgene Coral, RT        Apply for Visco supplementation (hyalgan) both knees. Message sent by April to Ivin Booty  Last injection series completed 05/03/2016  (Per Gilmer Mor last note)   Previous Messages

## 2016-12-12 NOTE — Telephone Encounter (Signed)
Message  Received: Wilburn Mylar  Message Contents  Erroll Wilbourne, RT  Daven Montz, RT        I have faxed in for benefits to Hyalgan, will followup.   Previous Messages

## 2016-12-14 NOTE — Telephone Encounter (Signed)
Rx routed to [facility] by Fidia, will followup

## 2016-12-15 NOTE — Telephone Encounter (Signed)
Sent to [facility], they will call office to discuss further.

## 2016-12-16 ENCOUNTER — Other Ambulatory Visit: Payer: Self-pay | Admitting: Family Medicine

## 2016-12-20 ENCOUNTER — Other Ambulatory Visit: Payer: Self-pay | Admitting: Family

## 2016-12-20 DIAGNOSIS — D649 Anemia, unspecified: Secondary | ICD-10-CM

## 2016-12-20 DIAGNOSIS — D72829 Elevated white blood cell count, unspecified: Secondary | ICD-10-CM

## 2016-12-20 DIAGNOSIS — D473 Essential (hemorrhagic) thrombocythemia: Secondary | ICD-10-CM

## 2016-12-20 DIAGNOSIS — D75839 Thrombocytosis, unspecified: Secondary | ICD-10-CM

## 2016-12-21 ENCOUNTER — Ambulatory Visit: Payer: Medicare Other

## 2016-12-21 ENCOUNTER — Other Ambulatory Visit (HOSPITAL_BASED_OUTPATIENT_CLINIC_OR_DEPARTMENT_OTHER): Payer: Medicare Other

## 2016-12-21 ENCOUNTER — Encounter: Payer: Self-pay | Admitting: Family Medicine

## 2016-12-21 ENCOUNTER — Ambulatory Visit (HOSPITAL_BASED_OUTPATIENT_CLINIC_OR_DEPARTMENT_OTHER): Payer: Medicare Other | Admitting: Family

## 2016-12-21 VITALS — BP 127/66 | HR 81 | Temp 98.3°F | Resp 19 | Ht 62.0 in | Wt 307.0 lb

## 2016-12-21 DIAGNOSIS — D473 Essential (hemorrhagic) thrombocythemia: Secondary | ICD-10-CM | POA: Diagnosis not present

## 2016-12-21 DIAGNOSIS — D72829 Elevated white blood cell count, unspecified: Secondary | ICD-10-CM | POA: Diagnosis not present

## 2016-12-21 DIAGNOSIS — E039 Hypothyroidism, unspecified: Secondary | ICD-10-CM | POA: Diagnosis not present

## 2016-12-21 DIAGNOSIS — D509 Iron deficiency anemia, unspecified: Secondary | ICD-10-CM

## 2016-12-21 DIAGNOSIS — D649 Anemia, unspecified: Secondary | ICD-10-CM | POA: Diagnosis not present

## 2016-12-21 DIAGNOSIS — D75839 Thrombocytosis, unspecified: Secondary | ICD-10-CM

## 2016-12-21 DIAGNOSIS — D508 Other iron deficiency anemias: Secondary | ICD-10-CM

## 2016-12-21 LAB — COMPREHENSIVE METABOLIC PANEL
ALBUMIN: 3.3 g/dL — AB (ref 3.5–5.0)
ALK PHOS: 114 U/L (ref 40–150)
ALT: 13 U/L (ref 0–55)
ANION GAP: 9 meq/L (ref 3–11)
AST: 15 U/L (ref 5–34)
BILIRUBIN TOTAL: 0.29 mg/dL (ref 0.20–1.20)
BUN: 22.4 mg/dL (ref 7.0–26.0)
CO2: 30 meq/L — AB (ref 22–29)
Calcium: 9.2 mg/dL (ref 8.4–10.4)
Chloride: 103 mEq/L (ref 98–109)
Creatinine: 0.8 mg/dL (ref 0.6–1.1)
EGFR: 73 mL/min/{1.73_m2} — AB (ref >90)
Glucose: 97 mg/dl (ref 70–140)
Potassium: 3.5 mEq/L (ref 3.5–5.1)
Sodium: 142 mEq/L (ref 136–145)
TOTAL PROTEIN: 6.8 g/dL (ref 6.4–8.3)

## 2016-12-21 LAB — CBC WITH DIFFERENTIAL (CANCER CENTER ONLY)
BASO#: 0.1 10*3/uL (ref 0.0–0.2)
BASO%: 0.6 % (ref 0.0–2.0)
EOS ABS: 0.1 10*3/uL (ref 0.0–0.5)
EOS%: 0.8 % (ref 0.0–7.0)
HCT: 38.6 % (ref 34.8–46.6)
HEMOGLOBIN: 12.2 g/dL (ref 11.6–15.9)
LYMPH#: 2.1 10*3/uL (ref 0.9–3.3)
LYMPH%: 17.4 % (ref 14.0–48.0)
MCH: 27.5 pg (ref 26.0–34.0)
MCHC: 31.6 g/dL — AB (ref 32.0–36.0)
MCV: 87 fL (ref 81–101)
MONO#: 0.8 10*3/uL (ref 0.1–0.9)
MONO%: 6.7 % (ref 0.0–13.0)
NEUT%: 74.5 % (ref 39.6–80.0)
NEUTROS ABS: 8.9 10*3/uL — AB (ref 1.5–6.5)
Platelets: 462 10*3/uL — ABNORMAL HIGH (ref 145–400)
RBC: 4.44 10*6/uL (ref 3.70–5.32)
RDW: 14.4 % (ref 11.1–15.7)
WBC: 11.9 10*3/uL — ABNORMAL HIGH (ref 3.9–10.0)

## 2016-12-21 LAB — FERRITIN: FERRITIN: 13 ng/mL (ref 9–269)

## 2016-12-21 LAB — CHCC SATELLITE - SMEAR

## 2016-12-21 LAB — LACTATE DEHYDROGENASE: LDH: 183 U/L (ref 125–245)

## 2016-12-21 LAB — IRON AND TIBC
%SAT: 9 % — ABNORMAL LOW (ref 21–57)
IRON: 37 ug/dL — AB (ref 41–142)
TIBC: 428 ug/dL (ref 236–444)
UIBC: 390 ug/dL — ABNORMAL HIGH (ref 120–384)

## 2016-12-21 NOTE — Progress Notes (Signed)
Hematology/Oncology Consultation   Name: Rebecca Brooks      MRN: [id number]    Location: Room/bed info not found  Date: 12/21/2016 Time:9:54 AM   REFERRING PHYSICIAN: Stacey A. Charlett Blake, MD  REASON FOR CONSULT: Leukocytosis, Anemia and Thrombocytosis    DIAGNOSIS:  Reactive leukocytosis Iron deficiency anemia  Thrombocytosis   HISTORY OF PRESENT ILLNESS: Rebecca Brooks is a very pleasant 66 yo caucasian female with history of elevated WBC count, anemia and thrombocytosis for at least the last 5 years. She was seen by Dr. Alen Blew in 2013 for this same problem. At that time it was felt that her counts were reactive to pneumonia, steroids and arthritis.  WBC count at this time is 11.9, Hgb 12.2 and platelet count 462.  She states that had pneumonia in January and attributes this to black mold in their home. They have since moved. She has some sinus drainage and congestion at this time. She has a dry cough and has been sneezing.  She is symptomatic with fatigue, weakness, SOB with exertion, 10 lb weight gain in 2 months, swelling in her feet and ankles, extremities feel "heavy" and occasional palpitations. She is also craving ice but tries not to chew it to save her teeth. Iron studies are pending.  No fever, chills, n/v, rash, dizziness, chest pain, abdominal pain or changes in bowel or bladder habits.  She states that she has 3 compressed vertebra and will be seeing a new spinal doctor tomorrow. She has numbness and tingling in her arms and hands that is positional and comes and goes.  She has generalized arthritic aches and pains that wax and wane.  No personal cancer history. Family cancer history includes maternal grandmother with colon cancer, maternal aunt with myelodysplasia and mother with lung cancer (she was a smoker).  Patient states that her last colonoscopy was 7 years ago and was negative. She had the Cologuard last year which was also negative.  She has 3 children by C-section and no  history of miscarriage. She has all her female organs in place and is post menopausal. No history of hormone replacement therapy.  She states that she is due for her mammogram this year in the summer.  She has hypothyroidism and is on daily synthroid. Her last TSH was 2.02.  She has maintained a good appetite and is staying well hydrated.  She does not smoke. She does like wine and has 2-3 glasses each evening.  She has had no episodes of bleeding. She states that she does bruise easily and has notice occasional petechiae in her arms that come and go.  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 She enjoys oil painting and does Creativity workshops.   ROS: All other 10 point review of systems is negative.   PAST MEDICAL HISTORY:   Past Medical History:  Diagnosis Date  . Abnormal nuclear stress test 08/23/2015  . Arthritis   . Chicken pox as a child  . Essential hypertension 07/17/2011   ACEI d/c 06/2011 for refractory cough> resolved   . GERD (gastroesophageal reflux disease)   . Hyperlipidemia   . Hypertension   . Hypothyroidism   . Insomnia 11/01/2014  . Low back pain radiating to right leg    intermittent and occasional numbness of skin over right hip in certain positions  . Measles as a child  . Mumps as a child  . Obesity   . Pneumonia  this October from which she has said inhailer  . Recurrent epistaxis 02/20/2012  . Rheumatoid arthritis ([place]) 09/10/2011   Bone on bone in knees Pain in L>R knees.   . RLS (restless legs syndrome) 11/01/2014    ALLERGIES: Allergies  Allergen Reactions  . Bee Venom Anaphylaxis  . Accupril [Quinapril Hcl] Cough      MEDICATIONS:  Current Outpatient Prescriptions on File Prior to Visit  Medication Sig Dispense Refill  . albuterol (PROAIR HFA) 108 (90 Base) MCG/ACT inhaler Inhale 2 puffs into the lungs every 6 (six) hours as needed for wheezing or shortness of breath. 8.5 Inhaler  6  . aspirin EC 81 MG tablet Take 1 tablet (81 mg total) by mouth daily.    Marland Kitchen buPROPion (WELLBUTRIN XL) 300 MG 24 hr tablet TK 1 T PO D  5  . cetirizine (ZYRTEC) 10 MG tablet Take 1 tablet (10 mg total) by mouth daily. 30 tablet 6  . diclofenac sodium (VOLTAREN) 1 % GEL APPLY 4 GRAMS TO 3 LARGE JOINTS TID PRN  2  . famotidine (PEPCID) 20 MG tablet Take 1 tablet (20 mg total) by mouth at bedtime. 90 tablet 2  . fluticasone (FLONASE) 50 MCG/ACT nasal spray SHAKE LIQUID AND USE 2 SPRAYS IN EACH NOSTRIL DAILY 16 g 1  . furosemide (LASIX) 20 MG tablet TAKE 2 TABLETS BY MOUTH DAILY FOR 5 DAYS THEN DECREASE TO 1 TABLET BY MOUTH DAILY AS NEEDED 180 tablet 3  . hydrochlorothiazide (HYDRODIURIL) 25 MG tablet TAKE 1 TABLET BY MOUTH DAILY 90 tablet 0  . hyoscyamine (LEVSIN SL) 0.125 MG SL tablet Place 1 tablet (0.125 mg total) under the tongue every 4 (four) hours as needed. 30 tablet 0  . levothyroxine (SYNTHROID, LEVOTHROID) 100 MCG tablet TAKE 1 TABLET BY MOUTH DAILY BEFORE BREAKFAST 90 tablet 0  . MELATONIN PO Take 1 tablet by mouth at bedtime as needed (sleep).    . metoprolol (LOPRESSOR) 50 MG tablet Take 1 tablet (50 mg total) by mouth 2 (two) times daily. 60 tablet 3  . naltrexone (DEPADE) 50 MG tablet TK 1 AND 1/2 TS PO D  3  . nitroGLYCERIN (NITROSTAT) 0.4 MG SL tablet Place 1 tablet (0.4 mg total) under the tongue every 5 (five) minutes as needed for chest pain. 25 tablet 3  . pantoprazole (PROTONIX) 40 MG tablet TAKE 1 TABLET(40 MG) BY MOUTH DAILY 30 tablet 0  . pantoprazole (PROTONIX) 40 MG tablet TAKE 1 TABLET BY MOUTH DAILY 30 tablet 5  . potassium chloride SA (K-DUR,KLOR-CON) 20 MEQ tablet TAKE 1 TABLET BY MOUTH DAILY AS NEEDED(DEPENDING ON LASIX USE) 90 tablet 3  . rOPINIRole (REQUIP) 0.5 MG tablet Take 1-2 tablets (0.5-1 mg total) by mouth at bedtime as needed. 180 tablet 0  . simvastatin (ZOCOR) 40 MG tablet TAKE 1 TABLET BY MOUTH EVERY EVENING 90 tablet 0  . traMADol (ULTRAM) 50 MG tablet  TAKE 1-2 TABLETS BY MOUTH EVERY 8 HOURS AS NEEDED 90 tablet 0  . Vitamin D, Cholecalciferol, 1000 units CAPS TAKE 1 CAPSULE BY MOUTH EVERY DAY 100 capsule 0  . zolpidem (AMBIEN) 10 MG tablet Take 1 tablet (10 mg total) by mouth at bedtime as needed for sleep. 30 tablet 0  . [DISCONTINUED] budesonide-formoterol (SYMBICORT) 160-4.5 MCG/ACT inhaler Inhale 2 puffs into the lungs 2 (two) times daily as needed. Patient has sample, is not using 1 Inhaler 0   No current facility-administered medications on file prior to visit.  PAST SURGICAL HISTORY Past Surgical History:  Procedure Laterality Date  . CARDIAC CATHETERIZATION N/A 08/23/2015   Procedure: Left Heart Cath and Coronary Angiography;  Surgeon: [name], MD;  Location: [facility];  Service: Cardiovascular;  Laterality: N/A;  . CESAREAN SECTION     X 3  . INGUINAL HERNIA REPAIR Right 66 yrs old  . THYROIDECTOMY     total for benign tumor, Parathyroid spared  . TONSILLECTOMY    . TOTAL HIP ARTHROPLASTY Right 2006   secondary to congenital  hip defect    FAMILY HISTORY: Family History  Problem Relation Age of Onset  . Lung cancer Mother 33       smoker  . Heart Problems Mother        tachycardia  . Hearing loss Mother   . Anxiety disorder Mother        anxiety, claustrophobia  . Osteoarthritis Father   . Hyperlipidemia Father   . Anuerysm Father        AAA  . Hyperlipidemia Sister   . Migraines Sister   . Hypertension Sister   . Colon cancer Maternal Grandmother   . Heart attack Maternal Grandfather   . Aneurysm Paternal Grandfather        abdominal  . Heart disease Paternal Grandfather        AAA rupture, smoker  . Heart Problems Sister        tachycardia    SOCIAL HISTORY:  reports that she quit smoking about 45 years ago. Her smoking use included Cigarettes. She has a 2.50 pack-year smoking history. She has never used smokeless tobacco. She reports that she drinks about 8.4 oz of alcohol per week .  She reports that she does not use drugs.  PERFORMANCE STATUS: The patient's performance status is 1 - Symptomatic but completely ambulatory  PHYSICAL EXAM: Most Recent Vital Signs: There were no vitals taken for this visit. BP 127/66 (BP Location: Right Arm, Patient Position: Sitting)   Pulse 81   Temp 98.3 F (36.8 C) (Oral)   Resp 19   Ht 5\' 2"  (1.575 m)   Wt (!) 307 lb (139.3 kg)   SpO2 100%   BMI 56.15 kg/m   General Appearance:    Alert, cooperative, no distress, appears stated age  Head:    Normocephalic, without obvious abnormality, atraumatic  Eyes:    PERRL, conjunctiva/corneas clear, EOM's intact, fundi    benign, both eyes        Throat:   Lips, mucosa, and tongue normal; teeth and gums normal  Neck:   Supple, symmetrical, trachea midline, no adenopathy;    thyroid:  no enlargement/tenderness/nodules; no carotid   bruit or JVD  Back:     Symmetric, no curvature, ROM normal, no CVA tenderness  Lungs:     Clear to auscultation bilaterally, respirations unlabored  Chest Wall:    No tenderness or deformity   Heart:    Regular rate and rhythm, S1 and S2 normal, no murmur, rub   or gallop     Abdomen:     Soft, non-tender, bowel sounds active all four quadrants,    no masses, no organomegaly        Extremities:   Extremities normal, atraumatic, no cyanosis or edema  Pulses:   2+ and symmetric all extremities  Skin:   Skin color, texture, turgor normal, no rashes or lesions  Lymph nodes:   Cervical, supraclavicular, and axillary nodes normal  Neurologic:   CNII-XII intact,  normal strength, sensation and reflexes    throughout    LABORATORY DATA:  Results for orders placed or performed in visit on 12/21/16 (from the past 48 hour(s))  CBC w/Diff     Status: Abnormal   Collection Time: 12/21/16  9:22 AM  Result Value Ref Range   WBC 11.9 (H) 3.9 - 10.0 10e3/uL   RBC 4.44 3.70 - 5.32 10e6/uL   HGB 12.2 11.6 - 15.9 g/dL   HCT 38.6 34.8 - 46.6 %   MCV 87 81 - 101  fL   MCH 27.5 26.0 - 34.0 pg   MCHC 31.6 (L) 32.0 - 36.0 g/dL   RDW 14.4 11.1 - 15.7 %   Platelets 462 (H) 145 - 400 10e3/uL   NEUT# 8.9 (H) 1.5 - 6.5 10e3/uL   LYMPH# 2.1 0.9 - 3.3 10e3/uL   MONO# 0.8 0.1 - 0.9 10e3/uL   Eosinophils Absolute 0.1 0.0 - 0.5 10e3/uL   BASO# 0.1 0.0 - 0.2 10e3/uL   NEUT% 74.5 39.6 - 80.0 %   LYMPH% 17.4 14.0 - 48.0 %   MONO% 6.7 0.0 - 13.0 %   EOS% 0.8 0.0 - 7.0 %   BASO% 0.6 0.0 - 2.0 %  Smear     Status: None   Collection Time: 12/21/16  9:22 AM  Result Value Ref Range   Smear Result Smear Available       RADIOGRAPHY: No results found.     PATHOLOGY: None  ASSESSMENT/PLAN: Rebecca Brooks is a very pleasant 66 yo caucasian female with history of elevated WBC count, anemia and thrombocytosis for at least the last 5 years. This appears to be reactive due to arthritis and hypothyroidism.  She is doing well and has had no issue with frequent infection. She is symptomatic with fatigued, weakness, SOB with exertion, weight gain, swelling in her feet and ankles, palpitations and craving ice.  We will see what her iron studies show and bring her back in next week for an infusion if needed.  We will go ahead and plan to see her back in 6 months for repeat lab work and follow-up.  All questions were answered. She will contact our office with any questions or concerns. We can certainly see her much sooner if necessary.  She was discussed with and also seen by Dr. Marin Olp and he is in agreement with the aforementioned.   [facility]     Addendum:  I saw and examined the patient with Javad Salva. I agree with the above assessment. We looked at her blood smear. I do not see anything on the blood smear that looked suspicious.  Her iron studies are a little on the low side. This might account for the elevated platelets.  I just don't see that we have to do a bone marrow test on her.  I don't see anything that looks like a myeloproliferative disorder.  We  will see about getting her back in 6 months.  We may have to consider some IV iron for her.  We spent about 40 minutes with her.  Lattie Haw, MD

## 2016-12-22 ENCOUNTER — Other Ambulatory Visit: Payer: Self-pay | Admitting: Family

## 2016-12-22 DIAGNOSIS — M5412 Radiculopathy, cervical region: Secondary | ICD-10-CM | POA: Diagnosis not present

## 2016-12-22 DIAGNOSIS — D509 Iron deficiency anemia, unspecified: Secondary | ICD-10-CM | POA: Insufficient documentation

## 2016-12-22 DIAGNOSIS — D508 Other iron deficiency anemias: Secondary | ICD-10-CM

## 2016-12-22 DIAGNOSIS — G5603 Carpal tunnel syndrome, bilateral upper limbs: Secondary | ICD-10-CM | POA: Diagnosis not present

## 2016-12-22 LAB — ERYTHROPOIETIN: ERYTHROPOIETIN: 37.1 m[IU]/mL — AB (ref 2.6–18.5)

## 2016-12-22 LAB — RETICULOCYTES: RETICULOCYTE COUNT: 2 % (ref 0.6–2.6)

## 2016-12-23 ENCOUNTER — Other Ambulatory Visit: Payer: Self-pay | Admitting: Family Medicine

## 2016-12-25 ENCOUNTER — Telehealth: Payer: Self-pay | Admitting: *Deleted

## 2016-12-25 NOTE — Telephone Encounter (Addendum)
Patient is aware of results. Patient wants treatment at [facility]. Message sent to scheduler.   ----- Message from Eliezer Bottom, NP sent at 12/22/2016  9:40 AM EDT ----- Regarding: Iron Iron low. She will need 2 doses of IV iron. I will send LOS to [facility]. Thank you!  Sarah  ----- Message ----- From: Interface, Lab In Three Zero One Sent: 12/21/2016   9:32 AM To: Eliezer Bottom, NP

## 2016-12-26 ENCOUNTER — Other Ambulatory Visit: Payer: Self-pay | Admitting: Rheumatology

## 2016-12-26 NOTE — Telephone Encounter (Signed)
Last Visit: 10/03/16 Next Visit: 03/06/17  Okay to refill Voltaren Gel?

## 2016-12-27 ENCOUNTER — Telehealth: Payer: Self-pay | Admitting: Family

## 2016-12-27 ENCOUNTER — Telehealth: Payer: Self-pay | Admitting: Rheumatology

## 2016-12-27 NOTE — Telephone Encounter (Signed)
Patient states the [facility] called her to set up payment and delivery for Hyalgan. Patient has not done it this way before, and is confused. Please call to advise patient of procedure.

## 2016-12-27 NOTE — Telephone Encounter (Signed)
sch iv infusion per [facility] office telephone encounter 6/7. Pt is closer to [place]

## 2016-12-28 ENCOUNTER — Other Ambulatory Visit: Payer: Self-pay | Admitting: Neurosurgery

## 2016-12-28 DIAGNOSIS — M5412 Radiculopathy, cervical region: Secondary | ICD-10-CM

## 2016-12-28 NOTE — Telephone Encounter (Signed)
IC pt LMVM to call me back to discuss.

## 2016-12-28 NOTE — Telephone Encounter (Signed)
Patient states the [name] called her to set up payment and delivery for Hyalgan. Patient has not done it this way before, and is confused. Please call to advise patient of procedure.      Documentation     Rebecca Brooks [phone]  [name], [name] 16 hours ago (4:11 PM)

## 2016-12-28 NOTE — Telephone Encounter (Signed)
Patient called, IC her again LMVM to call me back.

## 2016-12-31 ENCOUNTER — Other Ambulatory Visit: Payer: Self-pay | Admitting: Family Medicine

## 2017-01-05 ENCOUNTER — Ambulatory Visit (HOSPITAL_BASED_OUTPATIENT_CLINIC_OR_DEPARTMENT_OTHER): Payer: Medicare Other

## 2017-01-05 VITALS — BP 126/53 | HR 69 | Temp 98.7°F | Resp 20

## 2017-01-05 DIAGNOSIS — D508 Other iron deficiency anemias: Secondary | ICD-10-CM

## 2017-01-05 MED ORDER — SODIUM CHLORIDE 0.9 % IV SOLN
510.0000 mg | Freq: Once | INTRAVENOUS | Status: AC
  Administered 2017-01-05: 510 mg via INTRAVENOUS
  Filled 2017-01-05: qty 17

## 2017-01-05 MED ORDER — SODIUM CHLORIDE 0.9% FLUSH
3.0000 mL | Freq: Once | INTRAVENOUS | Status: DC | PRN
  Filled 2017-01-05: qty 10

## 2017-01-05 MED ORDER — SODIUM CHLORIDE 0.9% FLUSH
10.0000 mL | INTRAVENOUS | Status: DC | PRN
  Filled 2017-01-05: qty 10

## 2017-01-05 MED ORDER — SODIUM CHLORIDE 0.9 % IV SOLN
Freq: Once | INTRAVENOUS | Status: AC
  Administered 2017-01-05: 09:00:00 via INTRAVENOUS

## 2017-01-05 NOTE — Patient Instructions (Signed)

## 2017-01-05 NOTE — Telephone Encounter (Signed)
Please call patient and schedule hyalgan x 5 bilaterally, patient wants to do this as buy and bill. She knows it will be Monday 6/18 or later when you guys call her, thanks.  Patient says she does not want to do SPP, she wants to do this as buy and bill like previous.  She does not have all the money needed for copay up front. IC [facility] and advised.

## 2017-01-06 ENCOUNTER — Ambulatory Visit
Admission: RE | Admit: 2017-01-06 | Discharge: 2017-01-06 | Disposition: A | Discharge status: Home / Self Care | Payer: Medicare Other | Source: Ambulatory Visit | Attending: Neurosurgery | Admitting: Neurosurgery

## 2017-01-06 DIAGNOSIS — M50221 Other cervical disc displacement at C4-C5 level: Secondary | ICD-10-CM | POA: Diagnosis not present

## 2017-01-06 DIAGNOSIS — M5412 Radiculopathy, cervical region: Secondary | ICD-10-CM

## 2017-01-12 ENCOUNTER — Ambulatory Visit: Payer: Medicare Other

## 2017-01-15 ENCOUNTER — Telehealth: Payer: Self-pay | Admitting: *Deleted

## 2017-01-15 ENCOUNTER — Ambulatory Visit (HOSPITAL_BASED_OUTPATIENT_CLINIC_OR_DEPARTMENT_OTHER): Payer: Medicare Other

## 2017-01-15 VITALS — BP 97/78 | HR 81 | Temp 97.7°F | Resp 18

## 2017-01-15 DIAGNOSIS — D508 Other iron deficiency anemias: Secondary | ICD-10-CM

## 2017-01-15 MED ORDER — SODIUM CHLORIDE 0.9 % IV SOLN
Freq: Once | INTRAVENOUS | Status: AC
  Administered 2017-01-15: 10:00:00 via INTRAVENOUS

## 2017-01-15 MED ORDER — SODIUM CHLORIDE 0.9% FLUSH
3.0000 mL | Freq: Once | INTRAVENOUS | Status: DC | PRN
  Filled 2017-01-15: qty 10

## 2017-01-15 MED ORDER — SODIUM CHLORIDE 0.9 % IV SOLN
510.0000 mg | Freq: Once | INTRAVENOUS | Status: AC
  Administered 2017-01-15: 510 mg via INTRAVENOUS
  Filled 2017-01-15: qty 17

## 2017-01-15 MED ORDER — SODIUM CHLORIDE 0.9% FLUSH
10.0000 mL | INTRAVENOUS | Status: DC | PRN
  Filled 2017-01-15: qty 10

## 2017-01-15 NOTE — Patient Instructions (Signed)

## 2017-01-15 NOTE — Telephone Encounter (Signed)
FYI "I just received Feraheme 45 minutes ago.  Removed the dressing to the IV site.  My right hand is blue and green with a big bulge or blister the size of a deviled egg.  No red or blue streaks up the arm.  One of my right fingers is red.  No bleeding.  Stings or hurts a little like when a blood vessel breaks.  I can send a picture of it."  Instructed to apply another pressure dressing, cool compresses, elevate arm.  If discomfort continues try warm compresses.  Call if any changes.

## 2017-01-18 DIAGNOSIS — Z6841 Body Mass Index (BMI) 40.0 and over, adult: Secondary | ICD-10-CM | POA: Diagnosis not present

## 2017-01-18 DIAGNOSIS — M4802 Spinal stenosis, cervical region: Secondary | ICD-10-CM | POA: Diagnosis not present

## 2017-01-18 DIAGNOSIS — I10 Essential (primary) hypertension: Secondary | ICD-10-CM | POA: Diagnosis not present

## 2017-01-18 DIAGNOSIS — G5603 Carpal tunnel syndrome, bilateral upper limbs: Secondary | ICD-10-CM | POA: Diagnosis not present

## 2017-01-19 ENCOUNTER — Telehealth: Payer: Self-pay | Admitting: Rheumatology

## 2017-01-19 NOTE — Telephone Encounter (Signed)
Patient called wanting to know when she received her last cortisone injection.

## 2017-01-19 NOTE — Telephone Encounter (Signed)
Patient was advised her last cortisone injection was 10/25/16. Patient advised Hyalgan has been approved and the front staff will be calling her to make an appointment.

## 2017-01-22 ENCOUNTER — Telehealth: Payer: Self-pay | Admitting: *Deleted

## 2017-01-22 NOTE — Telephone Encounter (Signed)
Received request for Medical records from [facility], forwarded to [place] for email/scan/SLS 07/02

## 2017-01-25 DIAGNOSIS — G5602 Carpal tunnel syndrome, left upper limb: Secondary | ICD-10-CM | POA: Diagnosis not present

## 2017-02-07 ENCOUNTER — Other Ambulatory Visit: Payer: Self-pay | Admitting: Family Medicine

## 2017-02-14 ENCOUNTER — Other Ambulatory Visit: Payer: Self-pay

## 2017-02-14 MED ORDER — TRAMADOL HCL 50 MG PO TABS: ORAL_TABLET | ORAL | 0 refills | Status: DC

## 2017-02-15 ENCOUNTER — Other Ambulatory Visit: Payer: Self-pay

## 2017-02-15 DIAGNOSIS — Z6841 Body Mass Index (BMI) 40.0 and over, adult: Secondary | ICD-10-CM | POA: Diagnosis not present

## 2017-02-15 DIAGNOSIS — Z713 Dietary counseling and surveillance: Secondary | ICD-10-CM | POA: Diagnosis not present

## 2017-02-15 DIAGNOSIS — I10 Essential (primary) hypertension: Secondary | ICD-10-CM | POA: Diagnosis not present

## 2017-02-15 DIAGNOSIS — E785 Hyperlipidemia, unspecified: Secondary | ICD-10-CM | POA: Diagnosis not present

## 2017-02-15 MED ORDER — VITAMIN D3 25 MCG (1000 UT) PO CAPS: 1.0000 | ORAL_CAPSULE | Freq: Every day | ORAL | 0 refills | Status: DC

## 2017-02-15 MED ORDER — PANTOPRAZOLE SODIUM 40 MG PO TBEC: 40.0000 mg | DELAYED_RELEASE_TABLET | Freq: Every day | ORAL | 0 refills | Status: DC

## 2017-02-15 MED ORDER — SIMVASTATIN 40 MG PO TABS: 40.0000 mg | ORAL_TABLET | Freq: Every evening | ORAL | 0 refills | Status: DC

## 2017-02-15 MED ORDER — HYDROCHLOROTHIAZIDE 25 MG PO TABS: 25.0000 mg | ORAL_TABLET | Freq: Every day | ORAL | 0 refills | Status: DC

## 2017-02-15 NOTE — Addendum Note (Signed)
Addended byDamita Dunnings D on: 02/15/2017 09:12 AM   Modules accepted: Orders

## 2017-02-19 ENCOUNTER — Other Ambulatory Visit: Payer: Self-pay | Admitting: Family Medicine

## 2017-02-19 NOTE — Telephone Encounter (Signed)
traMADol -90 days    [facility] for [employer]

## 2017-02-27 NOTE — Telephone Encounter (Signed)
Left a message for patient to call back, and schedule appts for knee injections.

## 2017-02-27 NOTE — Progress Notes (Signed)
Office Visit Note  Patient: Rebecca Brooks             Date of Birth: 05-09-1951           MRN: [id number]             PCP: Mosie Lukes, MD Referring: Mosie Lukes, MD Visit Date: 03/06/2017 Occupation: @GUAROCC @    Subjective:  Follow-up (5 month follow up. Wants bialteral knee injections, not sleeping well due to pain. Worse pain in right knee. Pain medial and posterior on right knee. Pain lateral and posterior on left knee. Increased pain in left index finger and bilateral wrists. )   History of Present Illness: Rebecca Brooks is a 66 y.o. female with history of osteoarthritis. She states she's been having pain and discomfort in her bilateral knee joints. She's interested in getting cortisone injection to her knee joints. She is scheduled to have Visco supplement injections in future. She's been also having discomfort in her left index finger. She was seen by neurosurgeon Dr. Trenton Gammon in the past. Who did nerve conduction velocities and did left carpal tunnel release which did not help her hand symptoms.  Activities of Daily Living:  Patient reports morning stiffness for 30-45 minutes.   Patient Reports nocturnal pain.  Difficulty dressing/grooming: Denies Difficulty climbing stairs: Reports Difficulty getting out of chair: Reports Difficulty using hands for taps, buttons, cutlery, and/or writing: Reports   Review of Systems  Constitutional: Positive for fatigue and weight gain. Negative for night sweats, weight loss and weakness.  HENT: Negative for mouth sores, trouble swallowing, trouble swallowing, mouth dryness and nose dryness.   Eyes: Negative for pain, redness, visual disturbance and dryness.  Respiratory: Negative for cough, shortness of breath and difficulty breathing.   Cardiovascular: Negative for chest pain, palpitations, hypertension, irregular heartbeat and swelling in legs/feet.  Gastrointestinal: Negative for blood in stool, constipation and diarrhea.    Endocrine: Negative for increased urination.  Genitourinary: Negative for vaginal dryness.  Musculoskeletal: Positive for arthralgias, joint pain and morning stiffness. Negative for joint swelling, myalgias, muscle weakness, muscle tenderness and myalgias.  Skin: Negative for color change, rash, hair loss, skin tightness, ulcers and sensitivity to sunlight.  Allergic/Immunologic: Negative for susceptible to infections.  Neurological: Negative for dizziness, memory loss and night sweats.  Hematological: Negative for swollen glands.  Psychiatric/Behavioral: Negative for depressed mood and sleep disturbance. The patient is not nervous/anxious.     PMFS History:  Patient Active Problem List   Diagnosis Date Noted  . IDA (iron deficiency anemia) 12/22/2016  . Primary osteoarthritis of both knees 10/03/2016  . DDD (degenerative disc disease), cervical 10/03/2016  . DDD (degenerative disc disease), lumbar 10/03/2016  . History of total hip replacement, right 10/03/2016  . History of hypothyroidism 10/03/2016  . History of hypertension 10/03/2016  . History of anemia 10/03/2016  . Chondromalacia 10/03/2016  . Primary osteoarthritis of shoulder 10/03/2016  . History of high cholesterol 10/03/2016  . Primary osteoarthritis of both hands 10/03/2016  . Abnormal nuclear stress test 08/23/2015  . Globus sensation 08/13/2015  . Special screening for malignant neoplasms, colon 08/13/2015  . Chest pain at rest   . Chest pain with moderate risk of acute coronary syndrome 07/28/2015  . Annual physical exam 07/11/2015  . RLS (restless legs syndrome) 11/01/2014  . Insomnia 11/01/2014  . Thoracic back pain 01/12/2014  . Allergic rhinitis 11/03/2013  . Preventative health care 12/13/2012  . Recurrent epistaxis 02/20/2012  . Shoulder pain, bilateral  11/01/2011  . Arthralgia of both knees 10/18/2011  . Peripheral edema 09/10/2011  . Asthma 08/19/2011  . Palpitations 08/01/2011  . Anemia 08/01/2011   . Hypokalemia 08/01/2011  . Elevated WBC count 08/01/2011  . GERD (gastroesophageal reflux disease)   . Low back pain radiating to right leg   . Morbid obesity ([place]) 07/17/2011  . Hypothyroidism 07/17/2011  . Hyperlipidemia 07/17/2011  . Essential hypertension 07/17/2011    Past Medical History:  Diagnosis Date  . Abnormal nuclear stress test 08/23/2015  . Arthritis   . Chicken pox as a child  . Essential hypertension 07/17/2011   ACEI d/c 06/2011 for refractory cough> resolved   . GERD (gastroesophageal reflux disease)   . Hyperlipidemia   . Hypertension   . Hypothyroidism   . Insomnia 11/01/2014  . Low back pain radiating to right leg    intermittent and occasional numbness of skin over right hip in certain positions  . Measles as a child  . Mumps as a child  . Obesity   . Pneumonia    this October from which she has said inhailer  . Recurrent epistaxis 02/20/2012  . Rheumatoid arthritis ([place]) 09/10/2011   Bone on bone in knees Pain in L>R knees.   . RLS (restless legs syndrome) 11/01/2014    Family History  Problem Relation Age of Onset  . Lung cancer Mother 69       smoker  . Heart Problems Mother        tachycardia  . Hearing loss Mother   . Anxiety disorder Mother        anxiety, claustrophobia  . Osteoarthritis Father   . Hyperlipidemia Father   . Anuerysm Father        AAA  . Hyperlipidemia Sister   . Migraines Sister   . Hypertension Sister   . Colon cancer Maternal Grandmother   . Heart attack Maternal Grandfather   . Aneurysm Paternal Grandfather        abdominal  . Heart disease Paternal Grandfather        AAA rupture, smoker  . Heart Problems Sister        tachycardia   Past Surgical History:  Procedure Laterality Date  . CARDIAC CATHETERIZATION N/A 08/23/2015   Procedure: Left Heart Cath and Coronary Angiography;  Surgeon: [name], MD;  Location: [facility];  Service: Cardiovascular;  Laterality: N/A;  . CESAREAN SECTION     X 3   . INGUINAL HERNIA REPAIR Right 66 yrs old  . THYROIDECTOMY     total for benign tumor, Parathyroid spared  . TONSILLECTOMY    . TOTAL HIP ARTHROPLASTY Right 2006   secondary to congenital  hip defect   Social History   Social History Narrative  . No narrative on file     Objective: Vital Signs: BP 120/70 (BP Location: Left Arm, Patient Position: Sitting, Cuff Size: Normal)   Pulse 82   Ht 5' 2.25" (1.581 m)   Wt (!) 310 lb (140.6 kg)   BMI 56.25 kg/m    Physical Exam  Constitutional: She is oriented to person, place, and time. She appears well-developed and well-nourished.  HENT:  Head: Normocephalic and atraumatic.  Eyes: Conjunctivae and EOM are normal.  Neck: Normal range of motion.  Cardiovascular: Normal rate, regular rhythm, normal heart sounds and intact distal pulses.   Pulmonary/Chest: Effort normal and breath sounds normal.  Abdominal: Soft. Bowel sounds are normal.  Lymphadenopathy:    She has no  cervical adenopathy.  Neurological: She is alert and oriented to person, place, and time.  Skin: Skin is warm and dry. Capillary refill takes less than 2 seconds.  Psychiatric: She has a normal mood and affect. Her behavior is normal.  Nursing note and vitals reviewed.    Musculoskeletal Exam: C-spine and thoracic lumbar spine limited range of motion with discomfort. Right shoulder joint abduction was limited . Elbow joints wrist joints were good range of motion. She has severe arthritis in her PIP/DIP joints with no synovitis. Hip joints, knee joints, ankle joints, MTPs PIPs DIPs with good range of motion. She had full range of motion of bilateral knee joints without any form swelling or effusion.  CDAI Exam: No CDAI exam completed.    Investigation: Findings:  In March 2017, CBC and comprehensive metabolic panel were normal.  ANA, rheumatoid factor, and CCP were negative.  CBC Latest Ref Rng & Units 12/21/2016 12/04/2016 11/27/2016  WBC 3.9 - 10.0 10e3/uL 11.9(H)  15.9(H) 14.7(H)  Hemoglobin 11.6 - 15.9 g/dL 12.2 11.8(L) 11.6(L)  Hematocrit 34.8 - 46.6 % 38.6 37.0 35.9(L)  Platelets 145 - 400 10e3/uL 462(H) 504.0(H) 460.0(H)   CMP Latest Ref Rng & Units 12/21/2016 11/27/2016 05/29/2016  Glucose 70 - 140 mg/dl 97 83 83  BUN 7.0 - 26.0 mg/dL 22.4 26(H) 21  Creatinine 0.6 - 1.1 mg/dL 0.8 0.82 0.85  Sodium 136 - 145 mEq/L 142 139 139  Potassium 3.5 - 5.1 mEq/L 3.5 5.1 4.1  Chloride 96 - 112 mEq/L - 103 102  CO2 22 - 29 mEq/L 30(H) 28 29  Calcium 8.4 - 10.4 mg/dL 9.2 9.5 9.3  Total Protein 6.4 - 8.3 g/dL 6.8 6.7 6.7  Total Bilirubin 0.20 - 1.20 mg/dL 0.29 0.3 0.4  Alkaline Phos 40 - 150 U/L 114 88 97  AST 5 - 34 U/L 15 20 16   ALT 0 - 55 U/L 13 17 12     Imaging: No results found.  Speciality Comments: No specialty comments available.    Procedures:  Large Joint Inj Date/Time: 03/06/2017 1:46 PM Performed by: Bo Merino Authorized by: Bo Merino   Consent Given by:  Patient Site marked: the procedure site was marked   Timeout: prior to procedure the correct patient, procedure, and site was verified   Indications:  Pain and joint swelling Location:  Knee Site:  R knee Prep: patient was prepped and draped in usual sterile fashion   Needle Size:  27 G Needle Length:  1.5 inches Approach:  Medial Ultrasound Guidance: No   Fluoroscopic Guidance: No   Arthrogram: No   Medications:  1.5 mL lidocaine 1 %; 40 mg triamcinolone acetonide 40 MG/ML Aspiration Attempted: Yes   Aspirate amount (mL):  0 Patient tolerance:  Patient tolerated the procedure well with no immediate complications Large Joint Inj Date/Time: 03/06/2017 1:47 PM Performed by: Bo Merino Authorized by: Bo Merino   Consent Given by:  Patient Site marked: the procedure site was marked   Timeout: prior to procedure the correct patient, procedure, and site was verified   Indications:  Pain and joint swelling Location:  Knee Site:  L knee Prep:  patient was prepped and draped in usual sterile fashion   Needle Size:  27 G Needle Length:  1.5 inches Approach:  Medial Ultrasound Guidance: No   Fluoroscopic Guidance: No   Arthrogram: No   Medications:  1.5 mL lidocaine 1 %; 40 mg triamcinolone acetonide 40 MG/ML Aspiration Attempted: Yes   Aspirate amount (mL):  0 Patient tolerance:  Patient tolerated the procedure well with no immediate complications   Allergies: Bee venom and Accupril [quinapril hcl]   Assessment / Plan:     Visit Diagnoses: Primary osteoarthritis of both hands: She continues to have some stiffness in her hands and discomfort. She does have severe osteoarthritis in her hands. Joint protection and muscle strengthening was discussed.  Primary osteoarthritis of both shoulders: She has limited range of motion of her right shoulder joint with discomfort. Left shoulder joint and good range of motion.  History of total hip replacement, right: She recently started having pain in her right hip. I've advised to follow up with orthopedics in case her symptoms persist.  Primary osteoarthritis of both knees: She's been having increased pain in her bilateral knee joints and request cortisone injection. Per her request after informed consent was obtain bilateral knee joints were injected with cortisone as described above. She will also schedule Visco supplement injections. She good results to Visco supplement injections in the past.a prescription refill for Voltaren gel was given which can be used topically.  Chondromalacia  DDD (degenerative disc disease), cervical: Chronic pain  DDD (degenerative disc disease), lumbar: Chronic pain  Other elevated white blood cell (WBC) count: Probably related to steroid injections.  Primary insomnia  Peripheral edema  Morbid obesity ([place]): Weight loss diet and exercise was discussed at length.  History of anemia - Status post iron infusion  History of high cholesterol  History of  hypertension: Her blood pressure is well controlled.  History of hypothyroidism    Orders: No orders of the defined types were placed in this encounter.  No orders of the defined types were placed in this encounter.   Face-to-face time spent with patient was 30 minutes. 50% of time was spent in counseling and coordination of care.  Follow-Up Instructions: Return in about 6 months (around 09/06/2017) for Osteoarthritis.   Bo Merino, MD  Note - This record has been created using Editor, commissioning.  Chart creation errors have been sought, but may not always  have been located. Such creation errors do not reflect on  the standard of medical care.

## 2017-03-05 ENCOUNTER — Other Ambulatory Visit: Payer: Self-pay | Admitting: Family Medicine

## 2017-03-05 NOTE — Telephone Encounter (Signed)
Caller name: Kiosha Buchan Relationship to patient: self  Can be reached: [phone] Pharmacy: [facility], [place]  Reason for call: Pt states she takes 2 every morning and sometimes 2 later about 6:30pm. She doesn't always take 4 per day but doesn't like calling every month. She said she requested to have it switched to mail order for 90 day supply. She said it should be 120 doses per month so 360 doses for 90 day supply.   Pt was advised 90 doses sent to local [facility] 02/14/17.  Pt requesting call back.

## 2017-03-06 ENCOUNTER — Encounter: Payer: Self-pay | Admitting: Rheumatology

## 2017-03-06 ENCOUNTER — Ambulatory Visit (INDEPENDENT_AMBULATORY_CARE_PROVIDER_SITE_OTHER): Payer: Medicare Other | Admitting: Rheumatology

## 2017-03-06 ENCOUNTER — Ambulatory Visit: Payer: Medicare Other | Admitting: Rheumatology

## 2017-03-06 VITALS — BP 120/70 | HR 82 | Ht 62.25 in | Wt 310.0 lb

## 2017-03-06 DIAGNOSIS — Z862 Personal history of diseases of the blood and blood-forming organs and certain disorders involving the immune mechanism: Secondary | ICD-10-CM

## 2017-03-06 DIAGNOSIS — F5101 Primary insomnia: Secondary | ICD-10-CM | POA: Diagnosis not present

## 2017-03-06 DIAGNOSIS — Z8639 Personal history of other endocrine, nutritional and metabolic disease: Secondary | ICD-10-CM | POA: Diagnosis not present

## 2017-03-06 DIAGNOSIS — R609 Edema, unspecified: Secondary | ICD-10-CM | POA: Diagnosis not present

## 2017-03-06 DIAGNOSIS — D72828 Other elevated white blood cell count: Secondary | ICD-10-CM

## 2017-03-06 DIAGNOSIS — R600 Localized edema: Secondary | ICD-10-CM

## 2017-03-06 DIAGNOSIS — M5136 Other intervertebral disc degeneration, lumbar region: Secondary | ICD-10-CM

## 2017-03-06 DIAGNOSIS — Z96641 Presence of right artificial hip joint: Secondary | ICD-10-CM | POA: Diagnosis not present

## 2017-03-06 DIAGNOSIS — M5030 Other cervical disc degeneration, unspecified cervical region: Secondary | ICD-10-CM

## 2017-03-06 DIAGNOSIS — M19041 Primary osteoarthritis, right hand: Secondary | ICD-10-CM | POA: Diagnosis not present

## 2017-03-06 DIAGNOSIS — M1711 Unilateral primary osteoarthritis, right knee: Secondary | ICD-10-CM

## 2017-03-06 DIAGNOSIS — Z8679 Personal history of other diseases of the circulatory system: Secondary | ICD-10-CM | POA: Diagnosis not present

## 2017-03-06 DIAGNOSIS — M1712 Unilateral primary osteoarthritis, left knee: Secondary | ICD-10-CM | POA: Diagnosis not present

## 2017-03-06 DIAGNOSIS — M51369 Other intervertebral disc degeneration, lumbar region without mention of lumbar back pain or lower extremity pain: Secondary | ICD-10-CM

## 2017-03-06 DIAGNOSIS — M19012 Primary osteoarthritis, left shoulder: Secondary | ICD-10-CM

## 2017-03-06 DIAGNOSIS — M19042 Primary osteoarthritis, left hand: Secondary | ICD-10-CM

## 2017-03-06 DIAGNOSIS — M170 Bilateral primary osteoarthritis of knee: Secondary | ICD-10-CM

## 2017-03-06 DIAGNOSIS — M19011 Primary osteoarthritis, right shoulder: Secondary | ICD-10-CM | POA: Diagnosis not present

## 2017-03-06 DIAGNOSIS — M9420 Chondromalacia, unspecified site: Secondary | ICD-10-CM

## 2017-03-06 MED ORDER — TRAMADOL HCL 50 MG PO TABS: ORAL_TABLET | ORAL | 0 refills | Status: DC

## 2017-03-06 MED ORDER — LIDOCAINE HCL 1 % IJ SOLN
1.5000 mL | INTRAMUSCULAR | Status: AC | PRN
  Administered 2017-03-06: 1.5 mL

## 2017-03-06 MED ORDER — TRIAMCINOLONE ACETONIDE 40 MG/ML IJ SUSP
40.0000 mg | INTRAMUSCULAR | Status: AC | PRN
  Administered 2017-03-06: 40 mg via INTRA_ARTICULAR

## 2017-03-06 MED ORDER — DICLOFENAC SODIUM 1 % TD GEL: TRANSDERMAL | 0 refills | Status: DC

## 2017-03-06 NOTE — Telephone Encounter (Signed)
Last ov 11/27/16. Last refill 02/14/17 #90 0 Last UDS and contract signed 12/21/16. Please advise. LB

## 2017-03-06 NOTE — Telephone Encounter (Signed)
LMOVM advising that Rx for Tramadol was faxed to [facility] on file/thx [id number]

## 2017-03-18 ENCOUNTER — Ambulatory Visit (HOSPITAL_COMMUNITY)
Admission: EM | Admit: 2017-03-18 | Discharge: 2017-03-18 | Disposition: A | Discharge status: Home / Self Care | Payer: Medicare Other | Attending: Internal Medicine | Admitting: Internal Medicine

## 2017-03-18 ENCOUNTER — Encounter (HOSPITAL_COMMUNITY): Payer: Self-pay | Admitting: Emergency Medicine

## 2017-03-18 DIAGNOSIS — R300 Dysuria: Secondary | ICD-10-CM | POA: Diagnosis not present

## 2017-03-18 DIAGNOSIS — Z96641 Presence of right artificial hip joint: Secondary | ICD-10-CM | POA: Diagnosis not present

## 2017-03-18 DIAGNOSIS — G4700 Insomnia, unspecified: Secondary | ICD-10-CM | POA: Insufficient documentation

## 2017-03-18 DIAGNOSIS — R3915 Urgency of urination: Secondary | ICD-10-CM | POA: Diagnosis not present

## 2017-03-18 DIAGNOSIS — Z888 Allergy status to other drugs, medicaments and biological substances status: Secondary | ICD-10-CM | POA: Insufficient documentation

## 2017-03-18 DIAGNOSIS — K219 Gastro-esophageal reflux disease without esophagitis: Secondary | ICD-10-CM | POA: Diagnosis not present

## 2017-03-18 DIAGNOSIS — D509 Iron deficiency anemia, unspecified: Secondary | ICD-10-CM | POA: Insufficient documentation

## 2017-03-18 DIAGNOSIS — Z7951 Long term (current) use of inhaled steroids: Secondary | ICD-10-CM | POA: Diagnosis not present

## 2017-03-18 DIAGNOSIS — Z87440 Personal history of urinary (tract) infections: Secondary | ICD-10-CM | POA: Insufficient documentation

## 2017-03-18 DIAGNOSIS — Z801 Family history of malignant neoplasm of trachea, bronchus and lung: Secondary | ICD-10-CM | POA: Diagnosis not present

## 2017-03-18 DIAGNOSIS — R350 Frequency of micturition: Secondary | ICD-10-CM | POA: Diagnosis not present

## 2017-03-18 DIAGNOSIS — Z79899 Other long term (current) drug therapy: Secondary | ICD-10-CM | POA: Insufficient documentation

## 2017-03-18 DIAGNOSIS — Z7982 Long term (current) use of aspirin: Secondary | ICD-10-CM | POA: Insufficient documentation

## 2017-03-18 DIAGNOSIS — M170 Bilateral primary osteoarthritis of knee: Secondary | ICD-10-CM | POA: Diagnosis not present

## 2017-03-18 DIAGNOSIS — E785 Hyperlipidemia, unspecified: Secondary | ICD-10-CM | POA: Diagnosis not present

## 2017-03-18 DIAGNOSIS — Z87891 Personal history of nicotine dependence: Secondary | ICD-10-CM | POA: Diagnosis not present

## 2017-03-18 DIAGNOSIS — M5030 Other cervical disc degeneration, unspecified cervical region: Secondary | ICD-10-CM | POA: Insufficient documentation

## 2017-03-18 DIAGNOSIS — M5136 Other intervertebral disc degeneration, lumbar region: Secondary | ICD-10-CM | POA: Insufficient documentation

## 2017-03-18 DIAGNOSIS — G2581 Restless legs syndrome: Secondary | ICD-10-CM | POA: Insufficient documentation

## 2017-03-18 DIAGNOSIS — Z8249 Family history of ischemic heart disease and other diseases of the circulatory system: Secondary | ICD-10-CM | POA: Diagnosis not present

## 2017-03-18 DIAGNOSIS — E039 Hypothyroidism, unspecified: Secondary | ICD-10-CM | POA: Insufficient documentation

## 2017-03-18 DIAGNOSIS — E7800 Pure hypercholesterolemia, unspecified: Secondary | ICD-10-CM | POA: Diagnosis not present

## 2017-03-18 DIAGNOSIS — M069 Rheumatoid arthritis, unspecified: Secondary | ICD-10-CM | POA: Diagnosis not present

## 2017-03-18 DIAGNOSIS — I10 Essential (primary) hypertension: Secondary | ICD-10-CM | POA: Insufficient documentation

## 2017-03-18 DIAGNOSIS — Z818 Family history of other mental and behavioral disorders: Secondary | ICD-10-CM | POA: Diagnosis not present

## 2017-03-18 DIAGNOSIS — Z8261 Family history of arthritis: Secondary | ICD-10-CM | POA: Insufficient documentation

## 2017-03-18 LAB — POCT URINALYSIS DIP (DEVICE)
Bilirubin Urine: NEGATIVE
GLUCOSE, UA: 100 mg/dL — AB
Ketones, ur: NEGATIVE mg/dL
Nitrite: POSITIVE — AB
PROTEIN: NEGATIVE mg/dL
Specific Gravity, Urine: 1.015 (ref 1.005–1.030)
UROBILINOGEN UA: 1 mg/dL (ref 0.0–1.0)
pH: 6 (ref 5.0–8.0)

## 2017-03-18 MED ORDER — CEPHALEXIN 500 MG PO CAPS: 500.0000 mg | ORAL_CAPSULE | Freq: Two times a day (BID) | ORAL | 0 refills | Status: AC

## 2017-03-18 NOTE — ED Provider Notes (Signed)
[place]    CSN: [id number] Arrival date & time: 03/18/17  1256     History   Chief Complaint Chief Complaint  Patient presents with  . Dysuria    HPI Rebecca Brooks is a 66 y.o. female.   66 year old female presents with burning with urination and urinary urgency that started at 4am this morning. Also having some urinary frequency but denies any fever, abdominal pain, back pain, nausea, vomiting or hematuria. She has taken Uricare- similar to AZO- with some relief. Last UTI was about 5 years ago. Also on multiple medications for arthritis, GERD, HTN, insomnia and thyroid disease.    The history is provided by the patient.    Past Medical History:  Diagnosis Date  . Abnormal nuclear stress test 08/23/2015  . Arthritis   . Chicken pox as a child  . Essential hypertension 07/17/2011   ACEI d/c 06/2011 for refractory cough> resolved   . GERD (gastroesophageal reflux disease)   . Hyperlipidemia   . Hypertension   . Hypothyroidism   . Insomnia 11/01/2014  . Low back pain radiating to right leg    intermittent and occasional numbness of skin over right hip in certain positions  . Measles as a child  . Mumps as a child  . Obesity   . Pneumonia    this October from which she has said inhailer  . Recurrent epistaxis 02/20/2012  . Rheumatoid arthritis ([place]) 09/10/2011   Bone on bone in knees Pain in L>R knees.   . RLS (restless legs syndrome) 11/01/2014    Patient Active Problem List   Diagnosis Date Noted  . IDA (iron deficiency anemia) 12/22/2016  . Primary osteoarthritis of both knees 10/03/2016  . DDD (degenerative disc disease), cervical 10/03/2016  . DDD (degenerative disc disease), lumbar 10/03/2016  . History of total hip replacement, right 10/03/2016  . History of hypothyroidism 10/03/2016  . History of hypertension 10/03/2016  . History of anemia 10/03/2016  . Chondromalacia 10/03/2016  . Primary osteoarthritis of shoulder 10/03/2016  . History  of high cholesterol 10/03/2016  . Primary osteoarthritis of both hands 10/03/2016  . Abnormal nuclear stress test 08/23/2015  . Globus sensation 08/13/2015  . Special screening for malignant neoplasms, colon 08/13/2015  . Chest pain at rest   . Chest pain with moderate risk of acute coronary syndrome 07/28/2015  . Annual physical exam 07/11/2015  . RLS (restless legs syndrome) 11/01/2014  . Insomnia 11/01/2014  . Thoracic back pain 01/12/2014  . Allergic rhinitis 11/03/2013  . Preventative health care 12/13/2012  . Recurrent epistaxis 02/20/2012  . Shoulder pain, bilateral 11/01/2011  . Arthralgia of both knees 10/18/2011  . Peripheral edema 09/10/2011  . Asthma 08/19/2011  . Palpitations 08/01/2011  . Anemia 08/01/2011  . Hypokalemia 08/01/2011  . Elevated WBC count 08/01/2011  . GERD (gastroesophageal reflux disease)   . Low back pain radiating to right leg   . Morbid obesity ([place]) 07/17/2011  . Hypothyroidism 07/17/2011  . Hyperlipidemia 07/17/2011  . Essential hypertension 07/17/2011    Past Surgical History:  Procedure Laterality Date  . CARDIAC CATHETERIZATION N/A 08/23/2015   Procedure: Left Heart Cath and Coronary Angiography;  Surgeon: [name], MD;  Location: [facility];  Service: Cardiovascular;  Laterality: N/A;  . CESAREAN SECTION     X 3  . INGUINAL HERNIA REPAIR Right 66 yrs old  . THYROIDECTOMY     total for benign tumor, Parathyroid spared  . TONSILLECTOMY    .  TOTAL HIP ARTHROPLASTY Right 2006   secondary to congenital  hip defect    OB History    No data available       Home Medications    Prior to Admission medications   Medication Sig Start Date End Date Taking? Authorizing Provider  albuterol (PROAIR HFA) 108 (90 Base) MCG/ACT inhaler Inhale 2 puffs into the lungs every 6 (six) hours as needed for wheezing or shortness of breath. 04/03/16   Mosie Lukes, MD  aspirin EC 81 MG tablet Take 1 tablet (81 mg total) by mouth daily.  07/29/15   Elgergawy, Silver Huguenin, MD  cephALEXin (KEFLEX) 500 MG capsule Take 1 capsule (500 mg total) by mouth 2 (two) times daily. 03/18/17 03/25/17  Katy Apo, NP  cetirizine (ZYRTEC) 10 MG tablet Take 1 tablet (10 mg total) by mouth daily. 11/14/16   Mosie Lukes, MD  Cholecalciferol (VITAMIN D3) 1000 units CAPS Take 1 capsule (1,000 Units total) by mouth daily. 02/15/17   Mosie Lukes, MD  diclofenac sodium (VOLTAREN) 1 % GEL APPLY 4 GRAMS TO 3 LARGE JOINTS THREE TIMES DAILY AS NEEDED 03/06/17   Bo Merino, MD  famotidine (PEPCID) 20 MG tablet Take 1 tablet (20 mg total) by mouth at bedtime. 09/15/16   Mosie Lukes, MD  fluticasone (FLONASE) 50 MCG/ACT nasal spray SHAKE LIQUID AND USE 2 SPRAYS IN [facility] NOSTRIL DAILY 09/04/16   Mosie Lukes, MD  furosemide (LASIX) 20 MG tablet TAKE 2 TABLETS BY MOUTH DAILY FOR 5 DAYS THEN DECREASE TO 1 TABLET BY MOUTH DAILY AS NEEDED 11/27/16   Mosie Lukes, MD  hydrochlorothiazide (HYDRODIURIL) 25 MG tablet Take 1 tablet (25 mg total) by mouth daily. 02/15/17   Mosie Lukes, MD  hyoscyamine (LEVSIN SL) 0.125 MG SL tablet Place 1 tablet (0.125 mg total) under the tongue every 4 (four) hours as needed. 08/05/15   Mosie Lukes, MD  levothyroxine (SYNTHROID, LEVOTHROID) 100 MCG tablet TAKE 1 TABLET BY MOUTH DAILY BEFORE BREAKFAST 12/19/16   Copland, Gay Filler, MD  MELATONIN PO Take 1 tablet by mouth at bedtime as needed (sleep).    [provider]  metoprolol tartrate (LOPRESSOR) 50 MG tablet TAKE 1 TABLET(50 MG) BY MOUTH TWICE DAILY 02/19/17   Mosie Lukes, MD  nitroGLYCERIN (NITROSTAT) 0.4 MG SL tablet Place 1 tablet (0.4 mg total) under the tongue every 5 (five) minutes as needed for chest pain. 08/05/15   Mosie Lukes, MD  pantoprazole (PROTONIX) 40 MG tablet Take 1 tablet (40 mg total) by mouth daily. 02/15/17   Mosie Lukes, MD  potassium chloride SA (K-DUR,KLOR-CON) 20 MEQ tablet TAKE 1 TABLET BY MOUTH DAILY AS NEEDED(DEPENDING  ON LASIX USE) 11/27/16   Mosie Lukes, MD  rOPINIRole (REQUIP) 0.5 MG tablet Take 1-2 tablets (0.5-1 mg total) by mouth at bedtime as needed. 11/14/16   Mosie Lukes, MD  simvastatin (ZOCOR) 40 MG tablet Take 1 tablet (40 mg total) by mouth every evening. 02/15/17   Mosie Lukes, MD  topiramate (TOPAMAX) 25 MG tablet  02/15/17   [provider]  traMADol (ULTRAM) 50 MG tablet TAKE 1-2 TABLETS BY MOUTH EVERY 8 HOURS AS NEEDED 03/06/17   Mosie Lukes, MD  zolpidem (AMBIEN) 10 MG tablet Take 1 tablet (10 mg total) by mouth at bedtime as needed for sleep. 04/28/16 05/28/16  Mosie Lukes, MD    Family History Family History  Problem Relation Age of  Onset  . Lung cancer Mother 36       smoker  . Heart Problems Mother        tachycardia  . Hearing loss Mother   . Anxiety disorder Mother        anxiety, claustrophobia  . Osteoarthritis Father   . Hyperlipidemia Father   . Anuerysm Father        AAA  . Hyperlipidemia Sister   . Migraines Sister   . Hypertension Sister   . Colon cancer Maternal Grandmother   . Heart attack Maternal Grandfather   . Aneurysm Paternal Grandfather        abdominal  . Heart disease Paternal Grandfather        AAA rupture, smoker  . Heart Problems Sister        tachycardia    Social History Social History  Substance Use Topics  . Smoking status: Former Smoker    Packs/day: 0.50    Years: 5.00    Types: Cigarettes    Quit date: 07/25/1971  . Smokeless tobacco: Never Used  . Alcohol use 8.4 oz/week    14 Glasses of wine per week     Comment: socially     Allergies   Bee venom and Accupril [quinapril hcl]   Review of Systems Review of Systems  Constitutional: Negative for activity change, appetite change, chills, fatigue and fever.  HENT: Negative for congestion, postnasal drip and sore throat.   Respiratory: Negative for cough and chest tightness.   Gastrointestinal: Negative for abdominal pain, diarrhea, nausea and vomiting.    Genitourinary: Positive for dysuria, frequency and urgency. Negative for difficulty urinating, flank pain, genital sores, hematuria and vaginal discharge.  Musculoskeletal: Positive for arthralgias. Negative for back pain and neck pain.  Skin: Negative for rash and wound.  Allergic/Immunologic: Positive for environmental allergies.  Neurological: Negative for dizziness, syncope, weakness, numbness and headaches.  Hematological: Negative for adenopathy.     Physical Exam Triage Vital Signs ED Triage Vitals  Enc Vitals Group     BP 03/18/17 1318 137/68     Pulse Rate 03/18/17 1318 66     Resp 03/18/17 1318 20     Temp 03/18/17 1318 97.8 F (36.6 C)     Temp Source 03/18/17 1318 Oral     SpO2 03/18/17 1318 100 %     Weight --      Height --      Head Circumference --      Peak Flow --      Pain Score 03/18/17 1317 2     Pain Loc --      Pain Edu? --      Excl. in [place]? --    No data found.   Updated Vital Signs BP 137/68 (BP Location: Right Arm)   Pulse 66   Temp 97.8 F (36.6 C) (Oral)   Resp 20   SpO2 100%   Visual Acuity Right Eye Distance:   Left Eye Distance:   Bilateral Distance:    Right Eye Near:   Left Eye Near:    Bilateral Near:     Physical Exam  Constitutional: She is oriented to person, place, and time. She appears well-developed and well-nourished. No distress.  HENT:  Head: Normocephalic and atraumatic.  Eyes: Conjunctivae and EOM are normal.  Cardiovascular: Normal rate and regular rhythm.   Pulmonary/Chest: Effort normal.  Abdominal: Soft. Bowel sounds are normal. She exhibits no mass. There is no tenderness (but feels lower abdominal  pressure). There is no rigidity, no rebound, no guarding and no CVA tenderness.  Difficult to exam abdomen due to body habitus  Neurological: She is alert and oriented to person, place, and time.  Skin: Skin is warm and dry. No rash noted.  Psychiatric: She has a normal mood and affect. Her behavior is normal.  Judgment and thought content normal.     UC Treatments / Results  Labs (all labs ordered are listed, but only abnormal results are displayed) Labs Reviewed  POCT URINALYSIS DIP (DEVICE) - Abnormal; Notable for the following:       Result Value   Glucose, UA 100 (*)    Hgb urine dipstick TRACE (*)    Nitrite POSITIVE (*)    Leukocytes, UA SMALL (*)    All other components within normal limits  URINE CULTURE    EKG  EKG Interpretation None       Radiology No results found.  Procedures Procedures (including critical care time)  Medications Ordered in UC Medications - No data to display   Initial Impression / Assessment and Plan / UC Course  I have reviewed the triage vital signs and the nursing notes.  Pertinent labs & imaging results that were available during my care of the patient were reviewed by me and considered in my medical decision making (see chart for details).    Reviewed urinalysis results with patient- may have a UTI. Recommend start Keflex 500mg  twice a day as directed. May continue Uricare tablets as needed for pain. Continue to keep well hydrated. Follow-up pending urine culture results or in 3 days with her PCP if not improving.    Final Clinical Impressions(s) / UC Diagnoses   Final diagnoses:  Dysuria  Urinary urgency    New Prescriptions Discharge Medication List as of 03/18/2017  1:51 PM    START taking these medications   Details  cephALEXin (KEFLEX) 500 MG capsule Take 1 capsule (500 mg total) by mouth 2 (two) times daily., Starting Sun 03/18/2017, Until Sun 03/25/2017, Normal         Controlled Substance Prescriptions [place] Controlled Substance Registry consulted? Not Applicable   Katy Apo, NP 03/18/17 2250

## 2017-03-18 NOTE — ED Triage Notes (Signed)
The patient presented to the [name] with a complaint of dysuria and urinary urgency x 1 day. The patient stated that she did take an AZO today at 7 am.

## 2017-03-18 NOTE — Discharge Instructions (Signed)
Recommend start Keflex 500mg  twice a day as directed. May continue Uricare tablets as needed for pain/urinary symptoms. Continue to stay well hydrated. Follow-up pending urine culture results or in 3 days with your PCP if not improving.

## 2017-03-20 LAB — URINE CULTURE: SPECIAL REQUESTS: NORMAL

## 2017-03-21 ENCOUNTER — Other Ambulatory Visit: Payer: Self-pay | Admitting: Family Medicine

## 2017-04-09 NOTE — Progress Notes (Signed)
   Procedure Note  Patient: Rebecca Brooks             Date of Birth: 14-Apr-1951           MRN: [id number]             Visit Date: 04/18/2017  Procedures: Visit Diagnoses: Primary osteoarthritis of both knees - Plan: Large Joint Injection/Arthrocentesis, Large Joint Injection/Arthrocentesis Hyalgan #1 bilateral  Large Joint Inj Date/Time: 04/18/2017 1:26 PM Performed by: Bo Merino Authorized by: Bo Merino   Consent Given by:  Patient Site marked: the procedure site was marked   Timeout: prior to procedure the correct patient, procedure, and site was verified   Indications:  Pain Location:  Knee Site:  L knee Prep: patient was prepped and draped in usual sterile fashion   Needle Size:  27 G Needle Length:  1.5 inches Ultrasound Guidance: No   Fluoroscopic Guidance: No   Arthrogram: No   Medications:  20 mg Sodium Hyaluronate 20 MG/2ML; 1.5 mL lidocaine 1 % Aspiration Attempted: Yes   Patient tolerance:  Patient tolerated the procedure well with no immediate complications Large Joint Inj Date/Time: 04/18/2017 1:27 PM Performed by: Bo Merino Authorized by: Bo Merino   Consent Given by:  Patient Site marked: the procedure site was marked   Timeout: prior to procedure the correct patient, procedure, and site was verified   Indications:  Pain Location:  Knee Site:  R knee Prep: patient was prepped and draped in usual sterile fashion   Needle Size:  27 G Needle Length:  1.5 inches Ultrasound Guidance: No   Fluoroscopic Guidance: No   Arthrogram: No   Medications:  20 mg Sodium Hyaluronate 20 MG/2ML; 1.5 mL lidocaine 1 % Aspiration Attempted: Yes   Patient tolerance:  Patient tolerated the procedure well with no immediate complications    Bo Merino, MD

## 2017-04-10 ENCOUNTER — Encounter: Payer: Self-pay | Admitting: Family Medicine

## 2017-04-10 ENCOUNTER — Telehealth: Payer: Self-pay

## 2017-04-10 MED ORDER — LEVOTHYROXINE SODIUM 100 MCG PO TABS: ORAL_TABLET | ORAL | 0 refills | Status: DC

## 2017-04-10 MED ORDER — ROPINIROLE HCL 0.5 MG PO TABS: 0.5000 mg | ORAL_TABLET | Freq: Every evening | ORAL | 0 refills | Status: DC | PRN

## 2017-04-10 NOTE — Telephone Encounter (Signed)
Faxed 90d Levothyroxine and Ropinerole to [facility] per pt req/sent MyChart message to sched an appt/thx dmf

## 2017-04-10 NOTE — Telephone Encounter (Signed)
Pt would like to know if she would need a ov to received Xanax?

## 2017-04-11 MED ORDER — ALPRAZOLAM 0.25 MG PO TABS: 0.2500 mg | ORAL_TABLET | Freq: Every day | ORAL | 0 refills | Status: DC | PRN

## 2017-04-11 NOTE — Telephone Encounter (Signed)
Rx phoned into [facility] provider line (pharmacy currently closed). MyChart message sent to Pt.

## 2017-04-11 NOTE — Telephone Encounter (Signed)
I am willing to prescribe her a small amount of Xanax 0.25 mg tabs to get her through Sunday. Advise her to try a dose before Sunday to make sure it does not make her too sleepy. If it does we can try 1/2 tab. Sig 1 tab po qd prn anxiety, disp #5  ----- Message -----

## 2017-04-12 ENCOUNTER — Other Ambulatory Visit: Payer: Self-pay | Admitting: Family Medicine

## 2017-04-17 ENCOUNTER — Other Ambulatory Visit: Payer: Self-pay | Admitting: Family Medicine

## 2017-04-17 NOTE — Telephone Encounter (Signed)
Requesting:tramadol Contract:yes UDS:low risk nxt scrn 11/31/18 Last OV:11/27/16 Next JJ:OACZ Last Refill:03/06/17   #[id number]   Please advise

## 2017-04-17 NOTE — Telephone Encounter (Signed)
Sent rx to pharmacy.   Pc

## 2017-04-18 ENCOUNTER — Ambulatory Visit (INDEPENDENT_AMBULATORY_CARE_PROVIDER_SITE_OTHER): Payer: Medicare Other | Admitting: Rheumatology

## 2017-04-18 DIAGNOSIS — M1712 Unilateral primary osteoarthritis, left knee: Secondary | ICD-10-CM

## 2017-04-18 DIAGNOSIS — M170 Bilateral primary osteoarthritis of knee: Secondary | ICD-10-CM

## 2017-04-18 DIAGNOSIS — M1711 Unilateral primary osteoarthritis, right knee: Secondary | ICD-10-CM

## 2017-04-18 MED ORDER — SODIUM HYALURONATE (VISCOSUP) 20 MG/2ML IX SOSY
20.0000 mg | PREFILLED_SYRINGE | INTRA_ARTICULAR | Status: AC | PRN
  Administered 2017-04-18: 20 mg via INTRA_ARTICULAR

## 2017-04-18 MED ORDER — LIDOCAINE HCL 1 % IJ SOLN
1.5000 mL | INTRAMUSCULAR | Status: AC | PRN
  Administered 2017-04-18: 1.5 mL

## 2017-04-18 MED ORDER — SODIUM HYALURONATE (VISCOSUP) 20 MG/2ML IX SOSY
20.0000 mg | PREFILLED_SYRINGE | INTRA_ARTICULAR | Status: AC | PRN
Start: 2017-04-18 — End: 2017-04-18
  Administered 2017-04-18: 20 mg via INTRA_ARTICULAR

## 2017-04-19 NOTE — Progress Notes (Signed)
   Procedure Note  Patient: Rebecca Brooks             Date of Birth: Dec 07, 1950           MRN: [id number]             Visit Date: 04/25/2017  Procedures: Visit Diagnoses: No diagnosis found. Hyalgan #2 Bilateral knees No procedures performed

## 2017-04-24 NOTE — Progress Notes (Signed)
   Procedure Note  Patient: Rebecca Brooks             Date of Birth: Feb 27, 1951           MRN: [id number]             Visit Date: 05/02/2017  Procedures: Visit Diagnoses: Primary osteoarthritis of both knees - Plan: Large Joint Injection/Arthrocentesis, Large Joint Injection/Arthrocentesis Hyalgan #3 bilateral buy and bill  Large Joint Inj Date/Time: 05/02/2017 1:51 PM Performed by: Bo Merino Authorized by: Bo Merino   Consent Given by:  Patient Site marked: the procedure site was marked   Timeout: prior to procedure the correct patient, procedure, and site was verified   Indications:  Pain and joint swelling Location:  Knee Site:  R knee Prep: patient was prepped and draped in usual sterile fashion   Needle Size:  27 G Needle Length:  1.5 inches Approach:  Medial Ultrasound Guidance: No   Fluoroscopic Guidance: No   Arthrogram: No   Medications:  1.5 mL lidocaine 1 %; 20 mg Sodium Hyaluronate 20 MG/2ML Aspiration Attempted: Yes   Patient tolerance:  Patient tolerated the procedure well with no immediate complications Large Joint Inj Date/Time: 05/02/2017 1:53 PM Performed by: Bo Merino Authorized by: Bo Merino   Consent Given by:  Patient Site marked: the procedure site was marked   Timeout: prior to procedure the correct patient, procedure, and site was verified   Indications:  Pain and joint swelling Location:  Knee Site:  L knee Prep: patient was prepped and draped in usual sterile fashion   Needle Size:  27 G Needle Length:  1.5 inches Approach:  Medial Ultrasound Guidance: No   Fluoroscopic Guidance: No   Arthrogram: No   Medications:  1.5 mL lidocaine 1 %; 20 mg Sodium Hyaluronate 20 MG/2ML Aspiration Attempted: Yes   Patient tolerance:  Patient tolerated the procedure well with no immediate complications    Bo Merino, MD

## 2017-04-25 ENCOUNTER — Ambulatory Visit (INDEPENDENT_AMBULATORY_CARE_PROVIDER_SITE_OTHER): Payer: Medicare Other | Admitting: Rheumatology

## 2017-04-25 ENCOUNTER — Other Ambulatory Visit: Payer: Self-pay | Admitting: Family Medicine

## 2017-04-25 DIAGNOSIS — M170 Bilateral primary osteoarthritis of knee: Secondary | ICD-10-CM

## 2017-04-25 DIAGNOSIS — M1711 Unilateral primary osteoarthritis, right knee: Secondary | ICD-10-CM | POA: Diagnosis not present

## 2017-04-25 DIAGNOSIS — M1712 Unilateral primary osteoarthritis, left knee: Secondary | ICD-10-CM

## 2017-04-25 MED ORDER — SODIUM HYALURONATE (VISCOSUP) 20 MG/2ML IX SOSY
20.0000 mg | PREFILLED_SYRINGE | INTRA_ARTICULAR | Status: AC | PRN
  Administered 2017-04-25: 20 mg via INTRA_ARTICULAR

## 2017-04-25 MED ORDER — LIDOCAINE HCL 1 % IJ SOLN
1.5000 mL | INTRAMUSCULAR | Status: AC | PRN
  Administered 2017-04-25: 1.5 mL

## 2017-04-25 MED ORDER — LIDOCAINE HCL 1 % IJ SOLN
1.5000 mL | INTRAMUSCULAR | Status: AC | PRN
Start: 2017-04-25 — End: 2017-04-25
  Administered 2017-04-25: 1.5 mL

## 2017-04-25 NOTE — Telephone Encounter (Signed)
On 7.30.18 #180+3 was faxed/thx dmf

## 2017-04-25 NOTE — Progress Notes (Signed)
   Procedure Note  Patient: Rebecca Brooks             Date of Birth: [birth year]           MRN: [id number]             Visit Date: 04/25/2017  Procedures: Visit Diagnoses: Primary osteoarthritis of both knees - Plan: Large Joint Injection/Arthrocentesis, Large Joint Injection/Arthrocentesis   Bil, Hyalgan # 2, buy and bill  Large Joint Inj Date/Time: 04/25/2017 1:36 PM Performed by: Bo Merino Authorized by: Bo Merino   Consent Given by:  Patient Site marked: the procedure site was marked   Timeout: prior to procedure the correct patient, procedure, and site was verified   Indications:  Pain and joint swelling Location:  Knee Site:  R knee Prep: patient was prepped and draped in usual sterile fashion   Needle Size:  27 G Needle Length:  1.5 inches Approach:  Medial Ultrasound Guidance: No   Fluoroscopic Guidance: No   Arthrogram: No   Medications:  1.5 mL lidocaine 1 %; 20 mg Sodium Hyaluronate 20 MG/2ML Aspiration Attempted: Yes   Patient tolerance:  Patient tolerated the procedure well with no immediate complications Large Joint Inj Date/Time: 04/25/2017 1:39 PM Performed by: Bo Merino Authorized by: Bo Merino   Consent Given by:  Patient Site marked: the procedure site was marked   Timeout: prior to procedure the correct patient, procedure, and site was verified   Indications:  Pain and joint swelling Location:  Knee Site:  L knee Prep: patient was prepped and draped in usual sterile fashion   Needle Size:  27 G Needle Length:  1.5 inches Approach:  Medial Ultrasound Guidance: No   Fluoroscopic Guidance: No   Arthrogram: No   Medications:  1.5 mL lidocaine 1 %; 20 mg Sodium Hyaluronate 20 MG/2ML Aspiration Attempted: Yes   Patient tolerance:  Patient tolerated the procedure well with no immediate complications   Bo Merino, MD

## 2017-04-26 NOTE — Telephone Encounter (Signed)
Requesting:ambien Contract:yes UDS:low risk nxt 11/31/18 Last OV:11/27/16 Next OV:not scheduled Last Refill:04/28/16 [id number]   Please advise

## 2017-04-27 ENCOUNTER — Other Ambulatory Visit: Payer: Self-pay | Admitting: Family Medicine

## 2017-04-27 NOTE — Telephone Encounter (Signed)
rx faxed over

## 2017-05-02 ENCOUNTER — Ambulatory Visit (INDEPENDENT_AMBULATORY_CARE_PROVIDER_SITE_OTHER): Payer: Medicare Other | Admitting: Rheumatology

## 2017-05-02 ENCOUNTER — Encounter: Payer: Self-pay | Admitting: Family Medicine

## 2017-05-02 DIAGNOSIS — M1711 Unilateral primary osteoarthritis, right knee: Secondary | ICD-10-CM | POA: Diagnosis not present

## 2017-05-02 DIAGNOSIS — M1712 Unilateral primary osteoarthritis, left knee: Secondary | ICD-10-CM

## 2017-05-02 DIAGNOSIS — M170 Bilateral primary osteoarthritis of knee: Secondary | ICD-10-CM

## 2017-05-02 MED ORDER — SODIUM HYALURONATE (VISCOSUP) 20 MG/2ML IX SOSY
20.0000 mg | PREFILLED_SYRINGE | INTRA_ARTICULAR | Status: AC | PRN
  Administered 2017-05-02: 20 mg via INTRA_ARTICULAR

## 2017-05-02 MED ORDER — LIDOCAINE HCL 1 % IJ SOLN
1.5000 mL | INTRAMUSCULAR | Status: AC | PRN
  Administered 2017-05-02: 1.5 mL

## 2017-05-09 ENCOUNTER — Ambulatory Visit (INDEPENDENT_AMBULATORY_CARE_PROVIDER_SITE_OTHER): Payer: Medicare Other | Admitting: Rheumatology

## 2017-05-09 DIAGNOSIS — M1712 Unilateral primary osteoarthritis, left knee: Secondary | ICD-10-CM | POA: Diagnosis not present

## 2017-05-09 DIAGNOSIS — M1711 Unilateral primary osteoarthritis, right knee: Secondary | ICD-10-CM | POA: Diagnosis not present

## 2017-05-09 DIAGNOSIS — M170 Bilateral primary osteoarthritis of knee: Secondary | ICD-10-CM

## 2017-05-09 MED ORDER — LIDOCAINE HCL 1 % IJ SOLN
1.5000 mL | INTRAMUSCULAR | Status: AC | PRN
  Administered 2017-05-09: 1.5 mL

## 2017-05-09 MED ORDER — SODIUM HYALURONATE (VISCOSUP) 20 MG/2ML IX SOSY
20.0000 mg | PREFILLED_SYRINGE | INTRA_ARTICULAR | Status: AC | PRN
  Administered 2017-05-09: 20 mg via INTRA_ARTICULAR

## 2017-05-09 NOTE — Progress Notes (Signed)
   Procedure Note  Patient: Rebecca Brooks             Date of Birth: 08/01/1950           MRN: [id number]             Visit Date: 05/09/2017  Procedures: Visit Diagnoses: Primary osteoarthritis of both knees - Plan: Large Joint Injection/Arthrocentesis, Large Joint Injection/Arthrocentesis   Hyalgan # 4 BIl buy and bill  Large Joint Inj Date/Time: 05/09/2017 1:22 PM Performed by: Bo Merino Authorized by: Bo Merino   Consent Given by:  Patient Site marked: the procedure site was marked   Timeout: prior to procedure the correct patient, procedure, and site was verified   Indications:  Pain and joint swelling Location:  Knee Site:  R knee Prep: patient was prepped and draped in usual sterile fashion   Needle Size:  27 G Needle Length:  1.5 inches Approach:  Medial Ultrasound Guidance: No   Fluoroscopic Guidance: No   Arthrogram: No   Medications:  1.5 mL lidocaine 1 %; 20 mg Sodium Hyaluronate 20 MG/2ML Aspiration Attempted: Yes   Patient tolerance:  Patient tolerated the procedure well with no immediate complications Large Joint Inj Date/Time: 05/09/2017 1:23 PM Performed by: Bo Merino Authorized by: Bo Merino   Consent Given by:  Patient Site marked: the procedure site was marked   Timeout: prior to procedure the correct patient, procedure, and site was verified   Indications:  Pain and joint swelling Location:  Knee Site:  L knee Prep: patient was prepped and draped in usual sterile fashion   Needle Size:  27 G Needle Length:  1.5 inches Approach:  Medial Ultrasound Guidance: No   Fluoroscopic Guidance: No   Arthrogram: No   Medications:  1.5 mL lidocaine 1 %; 20 mg Sodium Hyaluronate 20 MG/2ML Aspiration Attempted: Yes   Patient tolerance:  Patient tolerated the procedure well with no immediate complications    Bo Merino, MD

## 2017-05-10 DIAGNOSIS — I10 Essential (primary) hypertension: Secondary | ICD-10-CM | POA: Diagnosis not present

## 2017-05-10 DIAGNOSIS — K219 Gastro-esophageal reflux disease without esophagitis: Secondary | ICD-10-CM | POA: Diagnosis not present

## 2017-05-10 DIAGNOSIS — M170 Bilateral primary osteoarthritis of knee: Secondary | ICD-10-CM | POA: Diagnosis not present

## 2017-05-11 NOTE — Telephone Encounter (Signed)
Pt is calling in to speak with assistant. She wouldn't go into details but would like a call back. Pt says that she was just previously speaking with assistant.    Please call back at # on file.

## 2017-05-14 ENCOUNTER — Other Ambulatory Visit: Payer: Medicare Other

## 2017-05-15 DIAGNOSIS — E559 Vitamin D deficiency, unspecified: Secondary | ICD-10-CM | POA: Diagnosis not present

## 2017-05-15 DIAGNOSIS — R635 Abnormal weight gain: Secondary | ICD-10-CM | POA: Diagnosis not present

## 2017-05-15 DIAGNOSIS — I10 Essential (primary) hypertension: Secondary | ICD-10-CM | POA: Diagnosis not present

## 2017-05-16 ENCOUNTER — Ambulatory Visit (INDEPENDENT_AMBULATORY_CARE_PROVIDER_SITE_OTHER): Payer: Medicare Other | Admitting: Rheumatology

## 2017-05-16 DIAGNOSIS — M1711 Unilateral primary osteoarthritis, right knee: Secondary | ICD-10-CM | POA: Diagnosis not present

## 2017-05-16 DIAGNOSIS — M170 Bilateral primary osteoarthritis of knee: Secondary | ICD-10-CM

## 2017-05-16 DIAGNOSIS — M25561 Pain in right knee: Principal | ICD-10-CM

## 2017-05-16 DIAGNOSIS — M1712 Unilateral primary osteoarthritis, left knee: Secondary | ICD-10-CM

## 2017-05-16 DIAGNOSIS — G8929 Other chronic pain: Secondary | ICD-10-CM

## 2017-05-16 DIAGNOSIS — M25562 Pain in left knee: Principal | ICD-10-CM

## 2017-05-16 MED ORDER — LIDOCAINE HCL 1 % IJ SOLN
1.5000 mL | INTRAMUSCULAR | Status: AC | PRN
  Administered 2017-05-16: 1.5 mL

## 2017-05-16 MED ORDER — SODIUM HYALURONATE (VISCOSUP) 20 MG/2ML IX SOSY
20.0000 mg | PREFILLED_SYRINGE | INTRA_ARTICULAR | Status: AC | PRN
  Administered 2017-05-16: 20 mg via INTRA_ARTICULAR

## 2017-05-16 NOTE — Progress Notes (Signed)
   Procedure Note  Patient: Rebecca Brooks             Date of Birth: [birth year]           MRN: [id number]             Visit Date: 05/16/2017  Procedures: Visit Diagnoses: Chronic pain of both knees  Primary osteoarthritis of both knees   Hyalgan No. 5 bilateral knee joints  Large Joint Inj Date/Time: 05/16/2017 1:37 PM Performed by: Bo Merino Authorized by: Bo Merino   Consent Given by:  Patient Site marked: the procedure site was marked   Timeout: prior to procedure the correct patient, procedure, and site was verified   Indications:  Pain and joint swelling Location:  Knee Site:  R knee Prep: patient was prepped and draped in usual sterile fashion   Needle Size:  27 G Needle Length:  1.5 inches Approach:  Medial Ultrasound Guidance: No   Fluoroscopic Guidance: No   Arthrogram: No   Medications:  1.5 mL lidocaine 1 %; 20 mg Sodium Hyaluronate 20 MG/2ML Aspiration Attempted: Yes   Aspirate amount (mL):  0 Patient tolerance:  Patient tolerated the procedure well with no immediate complications Large Joint Inj Date/Time: 05/16/2017 1:37 PM Performed by: Bo Merino Authorized by: Bo Merino   Consent Given by:  Patient Site marked: the procedure site was marked   Timeout: prior to procedure the correct patient, procedure, and site was verified   Indications:  Pain and joint swelling Location:  Knee Site:  L knee Prep: patient was prepped and draped in usual sterile fashion   Needle Size:  27 G Needle Length:  1.5 inches Approach:  Medial Ultrasound Guidance: No   Fluoroscopic Guidance: No   Arthrogram: No   Medications:  1.5 mL lidocaine 1 %; 20 mg Sodium Hyaluronate 20 MG/2ML Aspiration Attempted: Yes   Aspirate amount (mL):  0 Patient tolerance:  Patient tolerated the procedure well with no immediate complications   Bo Merino, MD

## 2017-05-18 ENCOUNTER — Encounter: Payer: Self-pay | Admitting: Family Medicine

## 2017-05-21 ENCOUNTER — Other Ambulatory Visit: Payer: Self-pay | Admitting: Family Medicine

## 2017-05-25 ENCOUNTER — Encounter: Payer: Self-pay | Admitting: Family Medicine

## 2017-05-30 DIAGNOSIS — Z6841 Body Mass Index (BMI) 40.0 and over, adult: Secondary | ICD-10-CM | POA: Diagnosis not present

## 2017-05-30 DIAGNOSIS — G5603 Carpal tunnel syndrome, bilateral upper limbs: Secondary | ICD-10-CM | POA: Diagnosis not present

## 2017-05-30 DIAGNOSIS — Z713 Dietary counseling and surveillance: Secondary | ICD-10-CM | POA: Diagnosis not present

## 2017-06-08 ENCOUNTER — Other Ambulatory Visit: Payer: Self-pay

## 2017-06-08 DIAGNOSIS — E785 Hyperlipidemia, unspecified: Secondary | ICD-10-CM | POA: Diagnosis not present

## 2017-06-08 DIAGNOSIS — I10 Essential (primary) hypertension: Secondary | ICD-10-CM | POA: Diagnosis not present

## 2017-06-08 DIAGNOSIS — Z6841 Body Mass Index (BMI) 40.0 and over, adult: Secondary | ICD-10-CM | POA: Diagnosis not present

## 2017-06-08 MED ORDER — VITAMIN D3 25 MCG (1000 UT) PO CAPS: 1.0000 | ORAL_CAPSULE | Freq: Every day | ORAL | 0 refills | Status: DC

## 2017-06-11 ENCOUNTER — Other Ambulatory Visit: Payer: Self-pay | Admitting: Family Medicine

## 2017-06-11 DIAGNOSIS — Z79899 Other long term (current) drug therapy: Secondary | ICD-10-CM

## 2017-06-13 ENCOUNTER — Other Ambulatory Visit: Payer: Self-pay | Admitting: Family Medicine

## 2017-06-13 NOTE — Telephone Encounter (Signed)
Requesting:Tramadol Contract:yes UDS: low risk next screen 11/31/18 Last OV:11/27/16 Next OV: Not scheduled Last Refill: 04/17/17   #90-0  Please Advise

## 2017-06-17 ENCOUNTER — Other Ambulatory Visit: Payer: Self-pay | Admitting: Family Medicine

## 2017-06-17 NOTE — Telephone Encounter (Signed)
Needs UDS for refill and will refill this one but needs appt before next refill

## 2017-06-18 ENCOUNTER — Other Ambulatory Visit: Payer: Self-pay | Admitting: Family Medicine

## 2017-06-18 DIAGNOSIS — M180 Bilateral primary osteoarthritis of first carpometacarpal joints: Secondary | ICD-10-CM | POA: Diagnosis not present

## 2017-06-18 DIAGNOSIS — R2231 Localized swelling, mass and lump, right upper limb: Secondary | ICD-10-CM | POA: Diagnosis not present

## 2017-06-18 DIAGNOSIS — M19041 Primary osteoarthritis, right hand: Secondary | ICD-10-CM | POA: Diagnosis not present

## 2017-06-18 DIAGNOSIS — G5601 Carpal tunnel syndrome, right upper limb: Secondary | ICD-10-CM | POA: Diagnosis not present

## 2017-06-18 DIAGNOSIS — M1811 Unilateral primary osteoarthritis of first carpometacarpal joint, right hand: Secondary | ICD-10-CM | POA: Diagnosis not present

## 2017-06-18 NOTE — Addendum Note (Signed)
Addended by: [name] A on: 06/18/2017 10:09 AM   Modules accepted: Orders

## 2017-06-19 ENCOUNTER — Other Ambulatory Visit: Payer: Self-pay

## 2017-06-19 MED ORDER — HYDROCHLOROTHIAZIDE 25 MG PO TABS: 25.0000 mg | ORAL_TABLET | Freq: Every day | ORAL | 0 refills | Status: DC

## 2017-06-19 NOTE — Telephone Encounter (Signed)
Copied from [place]. Topic: Quick Communication - See Telephone Encounter >> Jun 19, 2017  8:31 AM Ether Griffins B wrote: CRM for notification. See Telephone encounter for:  Pharmacy has tried faxing request for refill on hydrochlorothiazide and pt is out of medicine  06/19/17.

## 2017-06-20 DIAGNOSIS — M79631 Pain in right forearm: Secondary | ICD-10-CM | POA: Diagnosis not present

## 2017-06-20 DIAGNOSIS — M79641 Pain in right hand: Secondary | ICD-10-CM | POA: Diagnosis not present

## 2017-06-20 DIAGNOSIS — M180 Bilateral primary osteoarthritis of first carpometacarpal joints: Secondary | ICD-10-CM | POA: Diagnosis not present

## 2017-06-20 DIAGNOSIS — M79644 Pain in right finger(s): Secondary | ICD-10-CM | POA: Diagnosis not present

## 2017-06-21 ENCOUNTER — Other Ambulatory Visit: Payer: Self-pay | Admitting: Family Medicine

## 2017-06-21 NOTE — Telephone Encounter (Signed)
Patient notified- rx faxed

## 2017-06-22 ENCOUNTER — Other Ambulatory Visit: Payer: Self-pay

## 2017-06-22 ENCOUNTER — Encounter: Payer: Self-pay | Admitting: Family

## 2017-06-22 ENCOUNTER — Other Ambulatory Visit: Payer: Self-pay | Admitting: Family Medicine

## 2017-06-22 ENCOUNTER — Ambulatory Visit (HOSPITAL_BASED_OUTPATIENT_CLINIC_OR_DEPARTMENT_OTHER): Payer: Medicare Other | Admitting: Family

## 2017-06-22 ENCOUNTER — Other Ambulatory Visit: Payer: Medicare Other

## 2017-06-22 VITALS — BP 121/55 | HR 80 | Temp 98.3°F | Resp 18 | Wt 305.0 lb

## 2017-06-22 DIAGNOSIS — Z79899 Other long term (current) drug therapy: Secondary | ICD-10-CM

## 2017-06-22 DIAGNOSIS — D696 Thrombocytopenia, unspecified: Secondary | ICD-10-CM

## 2017-06-22 DIAGNOSIS — D72828 Other elevated white blood cell count: Secondary | ICD-10-CM | POA: Diagnosis not present

## 2017-06-22 DIAGNOSIS — D473 Essential (hemorrhagic) thrombocythemia: Secondary | ICD-10-CM

## 2017-06-22 DIAGNOSIS — M1990 Unspecified osteoarthritis, unspecified site: Secondary | ICD-10-CM

## 2017-06-22 DIAGNOSIS — Z23 Encounter for immunization: Secondary | ICD-10-CM | POA: Diagnosis not present

## 2017-06-22 DIAGNOSIS — D509 Iron deficiency anemia, unspecified: Secondary | ICD-10-CM

## 2017-06-22 DIAGNOSIS — D75839 Thrombocytosis, unspecified: Secondary | ICD-10-CM

## 2017-06-22 DIAGNOSIS — D508 Other iron deficiency anemias: Secondary | ICD-10-CM

## 2017-06-22 DIAGNOSIS — D72829 Elevated white blood cell count, unspecified: Secondary | ICD-10-CM

## 2017-06-22 LAB — CBC WITH DIFFERENTIAL (CANCER CENTER ONLY)
BASO#: 0.1 10*3/uL (ref 0.0–0.2)
BASO%: 0.4 % (ref 0.0–2.0)
EOS%: 0.9 % (ref 0.0–7.0)
Eosinophils Absolute: 0.1 10*3/uL (ref 0.0–0.5)
HCT: 42.6 % (ref 34.8–46.6)
HEMOGLOBIN: 14.2 g/dL (ref 11.6–15.9)
LYMPH#: 2.1 10*3/uL (ref 0.9–3.3)
LYMPH%: 14.4 % (ref 14.0–48.0)
MCH: 30.7 pg (ref 26.0–34.0)
MCHC: 33.3 g/dL (ref 32.0–36.0)
MCV: 92 fL (ref 81–101)
MONO#: 0.9 10*3/uL (ref 0.1–0.9)
MONO%: 6.6 % (ref 0.0–13.0)
NEUT%: 77.7 % (ref 39.6–80.0)
NEUTROS ABS: 11.1 10*3/uL — AB (ref 1.5–6.5)
PLATELETS: 459 10*3/uL — AB (ref 145–400)
RBC: 4.62 10*6/uL (ref 3.70–5.32)
RDW: 13.4 % (ref 11.1–15.7)
WBC: 14.3 10*3/uL — AB (ref 3.9–10.0)

## 2017-06-22 LAB — CMP (CANCER CENTER ONLY)
ALT(SGPT): 19 U/L (ref 10–47)
AST: 19 U/L (ref 11–38)
Albumin: 3.7 g/dL (ref 3.3–5.5)
Alkaline Phosphatase: 99 U/L — ABNORMAL HIGH (ref 26–84)
BILIRUBIN TOTAL: 0.8 mg/dL (ref 0.20–1.60)
BUN, Bld: 21 mg/dL (ref 7–22)
CO2: 32 meq/L (ref 18–33)
Calcium: 8.9 mg/dL (ref 8.0–10.3)
Chloride: 99 mEq/L (ref 98–108)
Creat: 0.9 mg/dl (ref 0.6–1.2)
GLUCOSE: 101 mg/dL (ref 73–118)
Potassium: 3.1 mEq/L — ABNORMAL LOW (ref 3.3–4.7)
SODIUM: 143 meq/L (ref 128–145)
Total Protein: 7.4 g/dL (ref 6.4–8.1)

## 2017-06-22 MED ORDER — INFLUENZA VAC SPLIT HIGH-DOSE 0.5 ML IM SUSY
PREFILLED_SYRINGE | INTRAMUSCULAR | Status: AC
  Filled 2017-06-22: qty 0.5

## 2017-06-22 MED ORDER — INFLUENZA VAC SPLIT HIGH-DOSE 0.5 ML IM SUSY
0.5000 mL | PREFILLED_SYRINGE | INTRAMUSCULAR | Status: AC
Start: 2017-06-23 — End: 2017-06-22
  Administered 2017-06-22: 0.5 mL via INTRAMUSCULAR

## 2017-06-22 NOTE — Progress Notes (Signed)
Hematology and Oncology Follow Up Visit  Rebecca Brooks [id number] Oct 27, 1950 66 y.o. 06/22/2017   Principle Diagnosis:  Reactive leukocytosis Iron deficiency anemia  Thrombocytosis   Current Therapy:   IV iron as indicated - last received 2 doses in June    Interim History:  Rebecca Brooks is is here today for follow-up. She is symptomatic with fatigue and SOB with over exertion. Her Hgb is 14.2 and platelet count is mildly elevated at 459.  She is working hard to lose weight. She states that one her weight is down to 300 lbs she will be able to have the gastric sleeve surgery. She needs to have knee replacements once her weight is down. She has arthritic pain in her right hand and knees. She just got a prescription from her PCP for Ultram to help with the pain.  She has had no fever, chills, n/v, cough, rash, dizziness, chest pain, palpitations, abdominal pain or changes in bowel or bladder habits.  She is currently on Lasix which has helped with the swelling in her legs. This is much better according to the patient. Pedal pulses are +1.  No episodes of bleeding, no bruising or petechiae. No lymphadenopathy found on exam.  She has maintained a good appetite and is staying well hydrated. Her weight is stable.   ECOG Performance Status: 1 - Symptomatic but completely ambulatory  Medications:  Allergies as of 06/22/2017      Reactions   Bee Venom Anaphylaxis   Accupril [quinapril Hcl] Cough      Medication List        Accurate as of 06/22/17  1:47 PM. Always use your most recent med list.          albuterol 108 (90 Base) MCG/ACT inhaler Commonly known as:  PROAIR HFA Inhale 2 puffs into the lungs every 6 (six) hours as needed for wheezing or shortness of breath.   ALPRAZolam 0.25 MG tablet Commonly known as:  XANAX Take 1 tablet (0.25 mg total) by mouth daily as needed for anxiety.   ANTI-INFLAMMATORY ENZYME Caps APPLY 1 2 GMS TO AFFECTED AREA 3 4 TIMES DAILY AS NEEDED  FOR PAIN  THIS IS A COMPOUNDED CREAM 1 PUMP EQUALS 1 GRAM   aspirin EC 81 MG tablet Take 1 tablet (81 mg total) by mouth daily.   cetirizine 10 MG tablet Commonly known as:  ZYRTEC TAKE 1 TABLET(10 MG) BY MOUTH DAILY   diclofenac sodium 1 % Gel Commonly known as:  VOLTAREN APPLY 4 GRAMS TO 3 LARGE JOINTS THREE TIMES DAILY AS NEEDED   famotidine 20 MG tablet Commonly known as:  PEPCID TAKE 1 TABLET(20 MG) BY MOUTH AT BEDTIME   fluticasone 50 MCG/ACT nasal spray Commonly known as:  FLONASE SHAKE LIQUID AND USE 2 SPRAYS IN EACH NOSTRIL DAILY   furosemide 20 MG tablet Commonly known as:  LASIX TAKE 2 TABLETS BY MOUTH DAILY FOR 5 DAYS THEN DECREASE TO 1 TABLET BY MOUTH DAILY AS NEEDED   hydrochlorothiazide 25 MG tablet Commonly known as:  HYDRODIURIL Take 1 tablet (25 mg total) by mouth daily.   hyoscyamine 0.125 MG SL tablet Commonly known as:  LEVSIN SL Place 1 tablet (0.125 mg total) under the tongue every 4 (four) hours as needed.   levothyroxine 100 MCG tablet Commonly known as:  SYNTHROID, LEVOTHROID TAKE 1 TABLET BY MOUTH DAILY BEFORE BREAKFAST   LOMAIRA 8 MG Tabs Generic drug:  Phentermine HCl Take 0.5 tablets by mouth daily.   MELATONIN  PO Take 1 tablet by mouth at bedtime as needed (sleep).   metoprolol tartrate 50 MG tablet Commonly known as:  LOPRESSOR TAKE 1 TABLET(50 MG) BY MOUTH TWICE DAILY   nitroGLYCERIN 0.4 MG SL tablet Commonly known as:  NITROSTAT Place 1 tablet (0.4 mg total) under the tongue every 5 (five) minutes as needed for chest pain.   pantoprazole 40 MG tablet Commonly known as:  PROTONIX TAKE 1 TABLET BY MOUTH DAILY   potassium chloride SA 20 MEQ tablet Commonly known as:  K-DUR,KLOR-CON TAKE 1 TABLET BY MOUTH DAILY AS NEEDED(DEPENDING ON LASIX USE)   rOPINIRole 0.5 MG tablet Commonly known as:  REQUIP Take 1-2 tablets (0.5-1 mg total) by mouth at bedtime as needed.   rOPINIRole 0.5 MG tablet Commonly known as:  REQUIP TAKE 1  TO 2 TABLETS BY MOUTH AT BEDTIME AS NEEDED   simvastatin 40 MG tablet Commonly known as:  ZOCOR Take 1 tablet (40 mg total) by mouth every evening.   topiramate 25 MG tablet Commonly known as:  TOPAMAX   traMADol 50 MG tablet Commonly known as:  ULTRAM TAKE 1-2 TABLETS BY MOUTH EVERY 8 HOURS AS NEEDED   Vitamin D3 1000 units Caps Take 1 capsule (1,000 Units total) daily by mouth.   zolpidem 10 MG tablet Commonly known as:  AMBIEN TAKE 1 TABLET BY MOUTH EVERY NIGHT AT BEDTIME AS NEEDED FOR SLEEP       Allergies:  Allergies  Allergen Reactions  . Bee Venom Anaphylaxis  . Accupril [Quinapril Hcl] Cough    Past Medical History, Surgical history, Social history, and Family History were reviewed and updated.  Review of Systems: All other 10 point review of systems is negative.   Physical Exam:  weight is 305 lb (138.3 kg) (abnormal). Her oral temperature is 98.3 F (36.8 C). Her blood pressure is 121/55 (abnormal) and her pulse is 80. Her respiration is 18 and oxygen saturation is 96%.   Wt Readings from Last 3 Encounters:  06/22/17 (!) 305 lb (138.3 kg)  03/06/17 (!) 310 lb (140.6 kg)  12/21/16 (!) 307 lb (139.3 kg)    Ocular: Sclerae unicteric, pupils equal, round and reactive to light Ear-nose-throat: Oropharynx clear, dentition fair Lymphatic: No cervical, supraclavicular or axillary adenopathy Lungs no rales or rhonchi, good excursion bilaterally Heart regular rate and rhythm, no murmur appreciated Abd soft, nontender, positive bowel sounds, no liver or spleen tip palpated on exam, no fluid wave  MSK no focal spinal tenderness, no joint edema Neuro: non-focal, well-oriented, appropriate affect Breasts: Deferred   Lab Results  Component Value Date   WBC 14.3 (H) 06/22/2017   HGB 14.2 06/22/2017   HCT 42.6 06/22/2017   MCV 92 06/22/2017   PLT 459 (H) 06/22/2017   Lab Results  Component Value Date   FERRITIN 13 12/21/2016   IRON 37 (L) 12/21/2016   TIBC  428 12/21/2016   UIBC 390 (H) 12/21/2016   IRONPCTSAT 9 (L) 12/21/2016   Lab Results  Component Value Date   RBC 4.62 06/22/2017   No results found for: KPAFRELGTCHN, LAMBDASER, KAPLAMBRATIO No results found for: IGGSERUM, IGA, IGMSERUM No results found for: Ronnald Ramp, A1GS, A2GS, Tillman Sers, SPEI   Chemistry      Component Value Date/Time   NA 143 06/22/2017 1311   NA 142 12/21/2016 0922   K 3.1 (L) 06/22/2017 1311   K 3.5 12/21/2016 0922   CL 99 06/22/2017 1311   CO2 32 06/22/2017 1311  CO2 30 (H) 12/21/2016 0922   BUN 21 06/22/2017 1311   BUN 22.4 12/21/2016 0922   CREATININE 0.9 06/22/2017 1311   CREATININE 0.8 12/21/2016 0922      Component Value Date/Time   CALCIUM 8.9 06/22/2017 1311   CALCIUM 9.2 12/21/2016 0922   ALKPHOS 99 (H) 06/22/2017 1311   ALKPHOS 114 12/21/2016 0922   AST 19 06/22/2017 1311   AST 15 12/21/2016 0922   ALT 19 06/22/2017 1311   ALT 13 12/21/2016 0922   BILITOT 0.80 06/22/2017 1311   BILITOT 0.29 12/21/2016 0922      Impression and Plan: Ms. Storck is a very pleasant 66 yo caucasian female with mild reactive leukocytosis with arthritis and iron deficiency anemia with  Mild thrombocytopenia.  She feels better after receiving 2 doses of IV iron in June but is now starting to become fatigue and SOB with over exertion again.  We will see what her iron studies show and bring her back in next week for an infusion if needed.  We will go ahead and plan top see her back again in another 6 months for follow-up.  She will contact our office with any questions or concerns. We can certainly see her sooner if need be.   Eliezer Bottom, NP 11/30/20181:47 PM

## 2017-06-23 ENCOUNTER — Encounter: Payer: Self-pay | Admitting: Family

## 2017-06-25 ENCOUNTER — Other Ambulatory Visit: Payer: Self-pay | Admitting: Family

## 2017-06-25 LAB — PAIN MGMT, PROFILE 8 W/CONF, U
6 Acetylmorphine: NEGATIVE ng/mL (ref <10)
ALCOHOL METABOLITES: POSITIVE ng/mL — AB (ref <500)
Amphetamines: NEGATIVE ng/mL (ref <500)
BENZODIAZEPINES: NEGATIVE ng/mL (ref <100)
Buprenorphine, Urine: NEGATIVE ng/mL (ref <5)
COCAINE METABOLITE: NEGATIVE ng/mL (ref <150)
Creatinine: 38.4 mg/dL
ETHYL SULFATE (ETS): 669 ng/mL — AB (ref <100)
Ethyl Glucuronide (ETG): 2648 ng/mL — ABNORMAL HIGH (ref <500)
MDMA: NEGATIVE ng/mL (ref <500)
Marijuana Metabolite: NEGATIVE ng/mL (ref <20)
OXYCODONE: NEGATIVE ng/mL (ref <100)
Opiates: NEGATIVE ng/mL (ref <100)
Oxidant: NEGATIVE ug/mL (ref <200)
pH: 8.36 (ref 4.5–9.0)

## 2017-06-25 LAB — IRON AND TIBC
%SAT: 16 % — ABNORMAL LOW (ref 21–57)
Iron: 57 ug/dL (ref 41–142)
TIBC: 351 ug/dL (ref 236–444)
UIBC: 295 ug/dL (ref 120–384)

## 2017-06-25 LAB — FERRITIN: Ferritin: 111 ng/ml (ref 9–269)

## 2017-06-26 DIAGNOSIS — Z6841 Body Mass Index (BMI) 40.0 and over, adult: Secondary | ICD-10-CM | POA: Diagnosis not present

## 2017-06-26 DIAGNOSIS — Z713 Dietary counseling and surveillance: Secondary | ICD-10-CM | POA: Diagnosis not present

## 2017-06-26 DIAGNOSIS — R2231 Localized swelling, mass and lump, right upper limb: Secondary | ICD-10-CM | POA: Diagnosis not present

## 2017-06-28 ENCOUNTER — Other Ambulatory Visit: Payer: Self-pay

## 2017-06-28 ENCOUNTER — Ambulatory Visit (HOSPITAL_BASED_OUTPATIENT_CLINIC_OR_DEPARTMENT_OTHER): Payer: Medicare Other

## 2017-06-28 VITALS — BP 130/74

## 2017-06-28 DIAGNOSIS — D509 Iron deficiency anemia, unspecified: Secondary | ICD-10-CM | POA: Diagnosis not present

## 2017-06-28 DIAGNOSIS — D508 Other iron deficiency anemias: Secondary | ICD-10-CM

## 2017-06-28 MED ORDER — SODIUM CHLORIDE 0.9 % IV SOLN
510.0000 mg | Freq: Once | INTRAVENOUS | Status: AC
  Administered 2017-06-28: 510 mg via INTRAVENOUS
  Filled 2017-06-28: qty 17

## 2017-06-28 NOTE — Patient Instructions (Signed)

## 2017-06-29 DIAGNOSIS — E876 Hypokalemia: Secondary | ICD-10-CM | POA: Diagnosis not present

## 2017-06-29 DIAGNOSIS — Z6841 Body Mass Index (BMI) 40.0 and over, adult: Secondary | ICD-10-CM | POA: Diagnosis not present

## 2017-06-29 DIAGNOSIS — I10 Essential (primary) hypertension: Secondary | ICD-10-CM | POA: Diagnosis not present

## 2017-07-02 ENCOUNTER — Other Ambulatory Visit: Payer: Self-pay | Admitting: Family Medicine

## 2017-07-04 DIAGNOSIS — R229 Localized swelling, mass and lump, unspecified: Secondary | ICD-10-CM | POA: Diagnosis not present

## 2017-07-04 DIAGNOSIS — G5603 Carpal tunnel syndrome, bilateral upper limbs: Secondary | ICD-10-CM | POA: Diagnosis not present

## 2017-07-04 DIAGNOSIS — M19041 Primary osteoarthritis, right hand: Secondary | ICD-10-CM | POA: Diagnosis not present

## 2017-07-04 DIAGNOSIS — M19042 Primary osteoarthritis, left hand: Secondary | ICD-10-CM | POA: Diagnosis not present

## 2017-07-10 ENCOUNTER — Other Ambulatory Visit: Payer: Self-pay | Admitting: Family Medicine

## 2017-07-11 ENCOUNTER — Other Ambulatory Visit: Payer: Self-pay | Admitting: Family Medicine

## 2017-07-13 DIAGNOSIS — E559 Vitamin D deficiency, unspecified: Secondary | ICD-10-CM | POA: Diagnosis not present

## 2017-07-13 DIAGNOSIS — R7301 Impaired fasting glucose: Secondary | ICD-10-CM | POA: Diagnosis not present

## 2017-07-13 DIAGNOSIS — D509 Iron deficiency anemia, unspecified: Secondary | ICD-10-CM | POA: Diagnosis not present

## 2017-07-13 DIAGNOSIS — R635 Abnormal weight gain: Secondary | ICD-10-CM | POA: Diagnosis not present

## 2017-07-14 ENCOUNTER — Other Ambulatory Visit: Payer: Self-pay | Admitting: Family Medicine

## 2017-07-22 ENCOUNTER — Other Ambulatory Visit: Payer: Self-pay | Admitting: Family Medicine

## 2017-07-25 MED ORDER — CETIRIZINE HCL 10 MG PO TABS: 10.0000 mg | ORAL_TABLET | Freq: Every day | ORAL | 0 refills | Status: DC

## 2017-07-25 MED ORDER — METOPROLOL TARTRATE 50 MG PO TABS: 50.0000 mg | ORAL_TABLET | Freq: Two times a day (BID) | ORAL | 0 refills | Status: DC

## 2017-07-25 MED ORDER — PANTOPRAZOLE SODIUM 40 MG PO TBEC: 40.0000 mg | DELAYED_RELEASE_TABLET | Freq: Every day | ORAL | 0 refills | Status: DC

## 2017-07-25 MED ORDER — LEVOTHYROXINE SODIUM 100 MCG PO TABS: 100.0000 ug | ORAL_TABLET | Freq: Every day | ORAL | 0 refills | Status: DC

## 2017-07-25 MED ORDER — HYDROCHLOROTHIAZIDE 25 MG PO TABS: 25.0000 mg | ORAL_TABLET | Freq: Every day | ORAL | 0 refills | Status: DC

## 2017-07-27 DIAGNOSIS — Z713 Dietary counseling and surveillance: Secondary | ICD-10-CM | POA: Diagnosis not present

## 2017-07-27 DIAGNOSIS — Z6841 Body Mass Index (BMI) 40.0 and over, adult: Secondary | ICD-10-CM | POA: Diagnosis not present

## 2017-08-03 DIAGNOSIS — E8881 Metabolic syndrome: Secondary | ICD-10-CM | POA: Diagnosis not present

## 2017-08-03 DIAGNOSIS — Z6841 Body Mass Index (BMI) 40.0 and over, adult: Secondary | ICD-10-CM | POA: Diagnosis not present

## 2017-08-03 DIAGNOSIS — I10 Essential (primary) hypertension: Secondary | ICD-10-CM | POA: Diagnosis not present

## 2017-08-10 ENCOUNTER — Other Ambulatory Visit: Payer: Self-pay | Admitting: Family Medicine

## 2017-08-13 ENCOUNTER — Other Ambulatory Visit: Payer: Self-pay | Admitting: Family Medicine

## 2017-08-14 ENCOUNTER — Other Ambulatory Visit: Payer: Self-pay | Admitting: Rheumatology

## 2017-08-15 NOTE — Telephone Encounter (Signed)
Last Visit: 03/06/17 Next Visit: 10/03/17  Okay to refill per Dr. Estanislado Pandy

## 2017-08-22 IMAGING — MR MR CERVICAL SPINE W/O CM
4 of 5 series · 27 of 48 positions shown · non-contrast
Comparison: Cervical spine radiographs 11/07/2016

CLINICAL DATA: Numbness and tingling in both arms. Symptoms
worsening since 8999. No history of surgery.

EXAM:
MRI CERVICAL SPINE WITHOUT CONTRAST
TECHNIQUE: Multiplanar, multisequence MR imaging of the cervical spine was
performed. No intravenous contrast was administered.

[Series 6: T1 · sagittal · 3.0mm · 0.66mm/px · 6 of 15 slices shown]
[im 1/15]
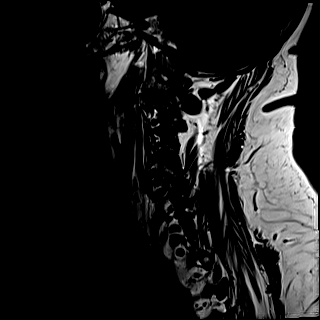
[im 3/15]
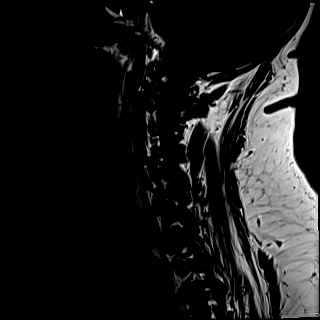
[im 6/15]
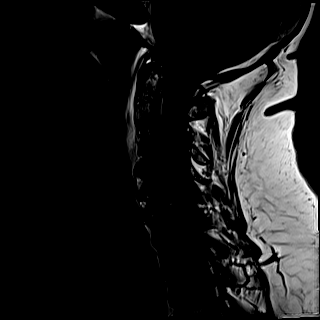
[im 9/15]
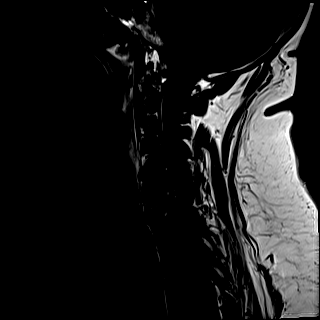
[im 12/15]
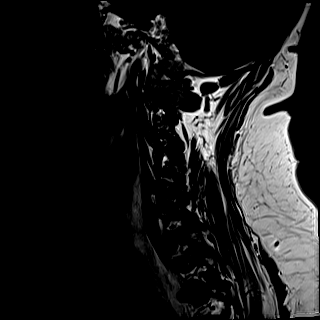
[im 15/15]
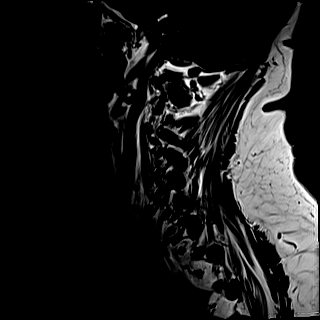

[Series 7: T2 · sagittal · 3.0mm · 0.55mm/px · 7 of 15 slices shown (1 of 2)]
[im 1/15]
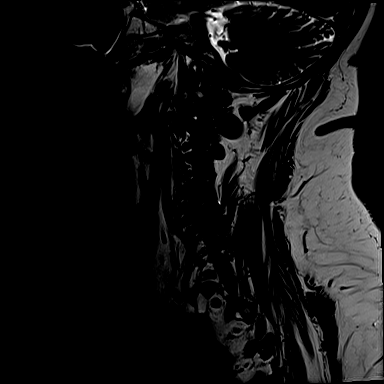
[im 3/15]
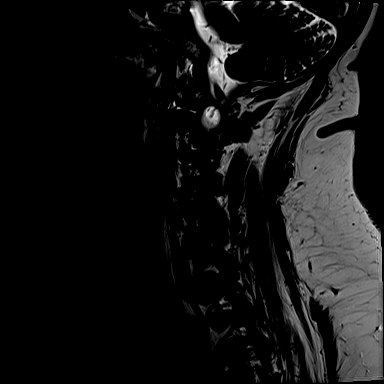
[im 5/15]
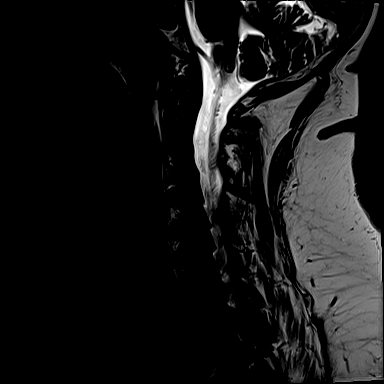
[im 8/15]
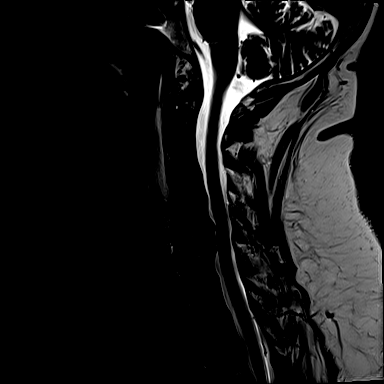
[im 10/15]
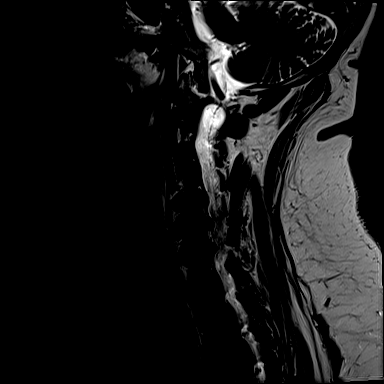
[im 12/15]
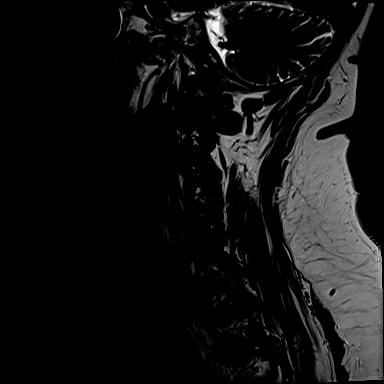
[im 15/15]
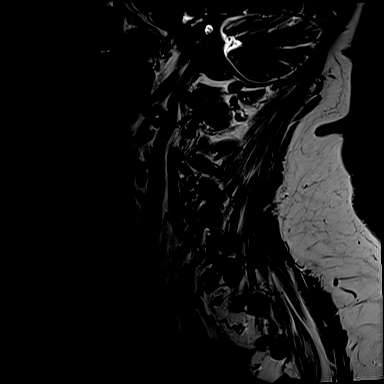

[Series 8: STIR · sagittal · 3.0mm · 0.33mm/px · 6 of 15 slices shown]
[im 1/15]
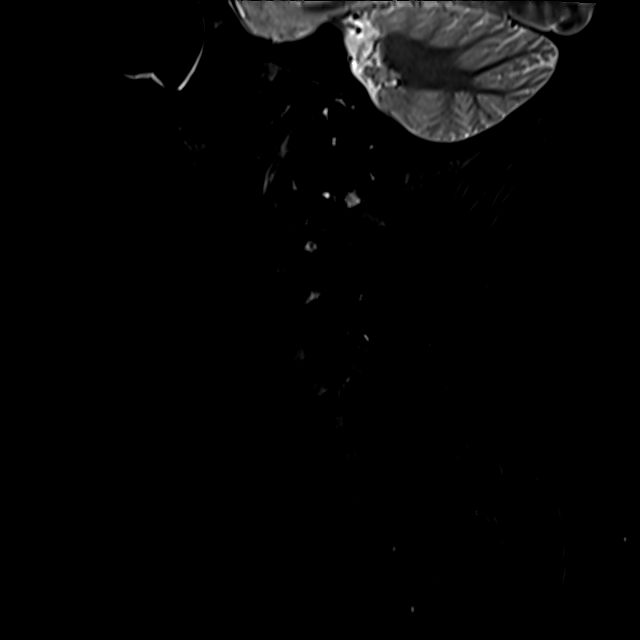
[im 3/15]
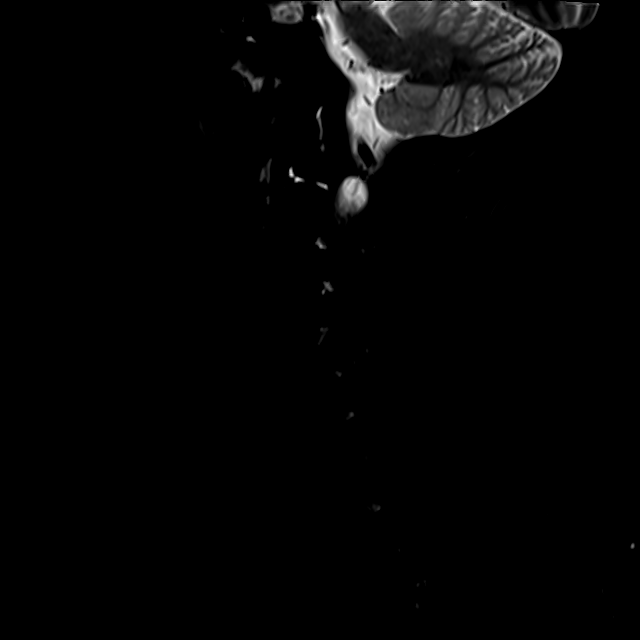
[im 5/15]
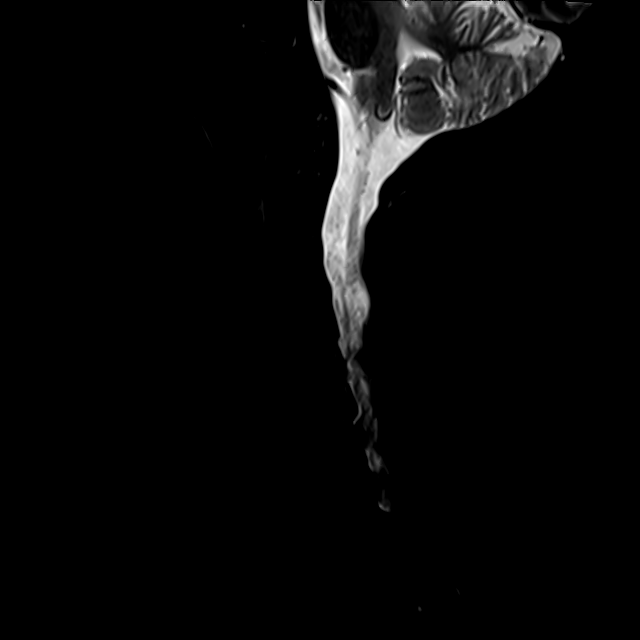
[im 8/15]
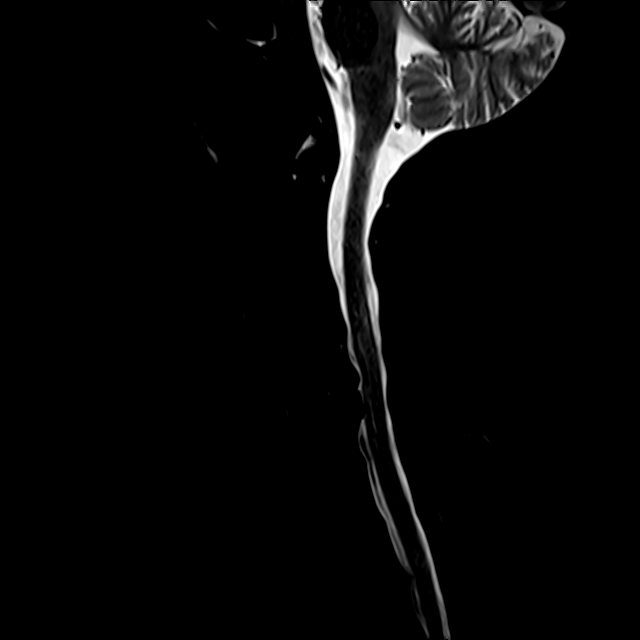
[im 10/15]
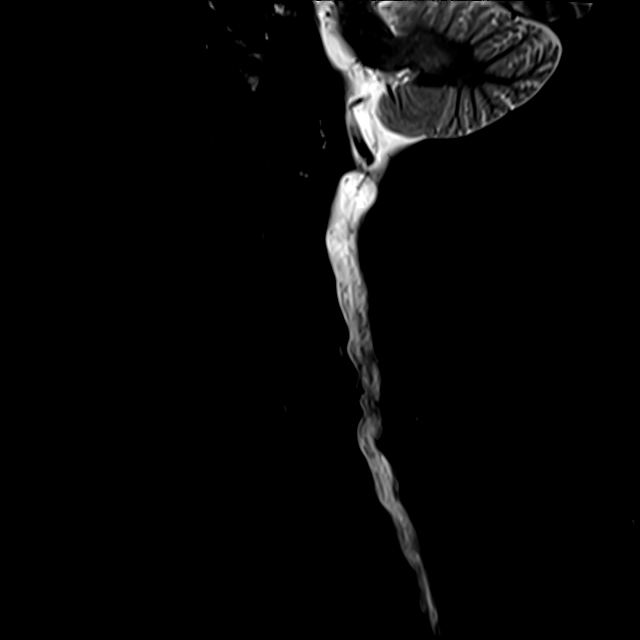
[im 12/15]
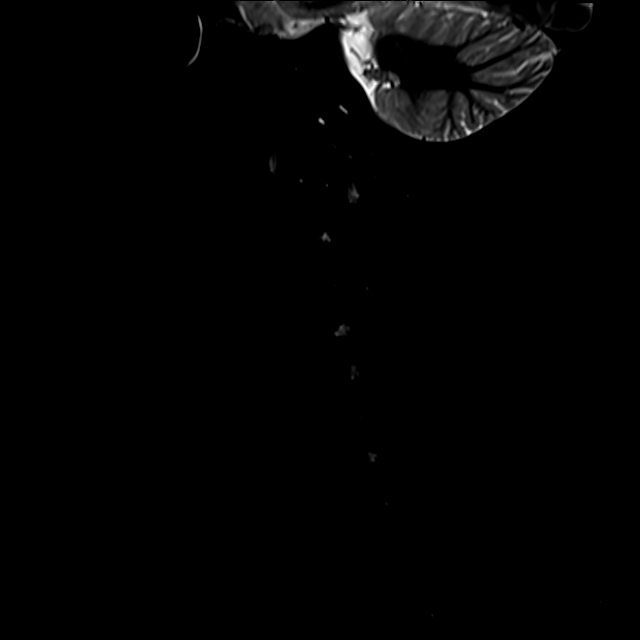

[Series 9: T2 · axial · 3.0mm · 0.50mm/px · z∈[-89,+5]mm · 8 of 30 slices shown (2 of 2)]
[im 1/30]
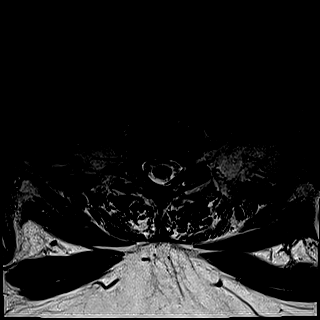
[im 5/30]
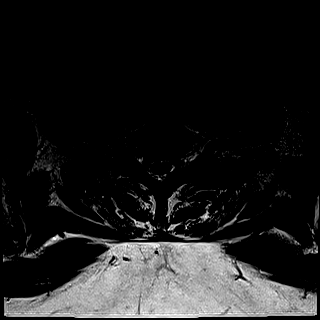
[im 9/30]
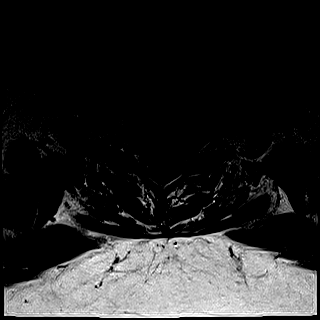
[im 14/30]
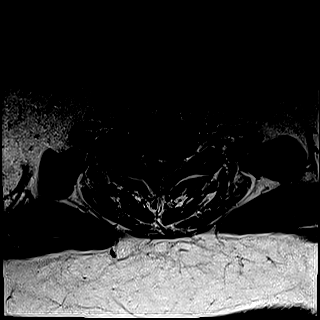
[im 16/30]
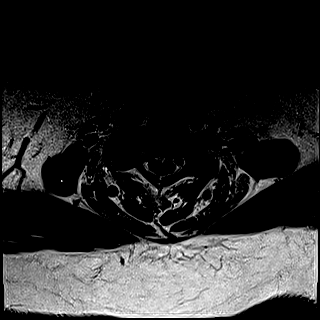
[im 21/30]
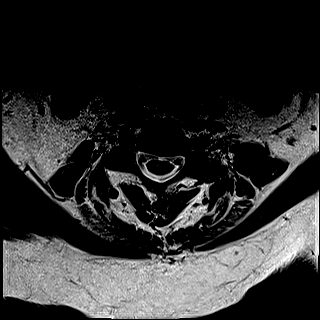
[im 25/30]
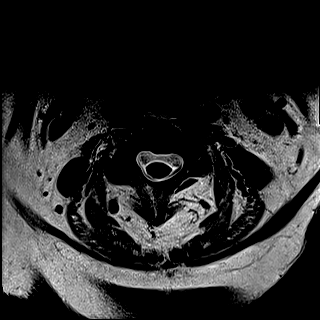
[im 30/30]
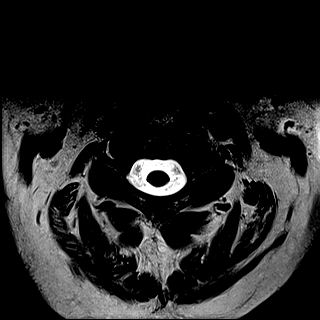

[27 of 48 positions shown; findings below may reference images not displayed]

FINDINGS: Alignment: Normal overall alignment. Moderate to advanced
degenerative cervical spondylosis with multilevel disc disease and
facet disease. No significant degenerative subluxations.

Vertebrae: Multilevel endplate reactive changes. No fracture or bone
lesion.

Cord: No cord lesions.  No syrinx.

Posterior Fossa, vertebral arteries, paraspinal tissues: No
significant findings.

Disc levels:

C2-3:  No significant findings.

C3-4:  No significant findings.

C4-5: Disc disease and facet disease. There is a bulging annulus,
osteophytic ridging and uncinate spurring. Mild flattening of the
ventral thecal sac slightly asymmetric left. There is moderate right
foraminal stenosis and mild left foraminal stenosis.

C5-6: Advanced disc disease and facet disease with a bulging
degenerated annulus, osteophytic ridging and uncinate spurring.
There is flattening of the ventral thecal sac and narrowing of the
ventral CSF space. Mild foraminal stenosis bilaterally, right
greater than left.

C6-7: Disc disease and facet disease with a bulging annulus,
osteophytic ridging and uncinate spurring. Mild flattening of the
ventral thecal sac and narrowing of the ventral CSF space. Mild
foraminal stenosis on the right and moderate foraminal stenosis on
the left.

C7-T1: Mild annular bulge. No significant spinal or foraminal
stenosis.
IMPRESSION: 1. Degenerative cervical spondylosis with multilevel disc disease
and facet disease.
2. No acute bony findings and no cord lesions or syrinx.
3. Spinal and foraminal stenosis at C4-5, C5-6 and C6-7 as discussed
above.

## 2017-09-04 ENCOUNTER — Ambulatory Visit: Payer: Medicare Other | Admitting: Rheumatology

## 2017-09-05 ENCOUNTER — Ambulatory Visit: Payer: Medicare Other | Admitting: Rheumatology

## 2017-09-07 DIAGNOSIS — Z6841 Body Mass Index (BMI) 40.0 and over, adult: Secondary | ICD-10-CM | POA: Diagnosis not present

## 2017-09-07 DIAGNOSIS — I10 Essential (primary) hypertension: Secondary | ICD-10-CM | POA: Diagnosis not present

## 2017-09-07 DIAGNOSIS — E538 Deficiency of other specified B group vitamins: Secondary | ICD-10-CM | POA: Diagnosis not present

## 2017-09-09 ENCOUNTER — Other Ambulatory Visit: Payer: Self-pay | Admitting: Family Medicine

## 2017-09-10 ENCOUNTER — Other Ambulatory Visit: Payer: Self-pay | Admitting: Family Medicine

## 2017-09-19 ENCOUNTER — Other Ambulatory Visit: Payer: Self-pay | Admitting: Family Medicine

## 2017-09-20 NOTE — Telephone Encounter (Signed)
[facility] [address]  Requesting: Tramadol Contract: yes UDS: Due Last OV: 11/27/16 Next OV: nothing scheduled Last Refill: 06/17/17 Database: no concerns   Please advise

## 2017-09-20 NOTE — Progress Notes (Signed)
Office Visit Note  Patient: EMSLEY Brooks             Date of Birth: May 01, 1951           MRN: [id number]             PCP: Rebecca Lukes, MD Referring: Rebecca Lukes, MD Visit Date: 10/03/2017 Occupation: @GUAROCC @    Subjective:  Bilateral knee pain    History of Present Illness: Rebecca Brooks is a 67 y.o. female with history of osteoarthritis and DDD.  Patient states she has been having worsening bilateral knee pain over the past few months.  She states she is going to be attending her sons wedding in April 2019, and she would like to have some relief.  She would like bilateral cortisone injections today.  She states her left knee pain is more severe.  She states she experiences a locking sensation in her left knee.  She denies any joint swelling.  She states she experiences occasional warmth of her right knee.  She states she is also expensing bilateral hand pain.  She states her hands occasionally swell.  She has significant joint stiffness in her bilateral knees and bilateral hands.  She states she has been taking Advil 600 mg up to 3 times a day as well as tramadol 50 mg 1 tablet daily for pain relief.  She states her right shoulder will cause occasional discomfort if she is lying on her right side.  She states she is also experiencing muscle tenderness in her bilateral legs.  She describes the sensation as an aching.  She states she also has restless legs at night.  She states her neck pain has improved.  She continues to have some lower back pain.  She states her right hip replacement is doing well.  She states she has been sleeping better and takes Ambien only sporadically.  She states she uses Voltaren gel on multiple joints which helps relieve some of her pain.   Activities of Daily Living:  Patient reports morning stiffness for 45 minutes.   Patient Reports nocturnal pain.  Difficulty dressing/grooming: Denies Difficulty climbing stairs: Reports Difficulty getting out of  chair: Reports Difficulty using hands for taps, buttons, cutlery, and/or writing: Reports   Review of Systems  Constitutional: Positive for fatigue. Negative for weakness.  HENT: Positive for mouth dryness. Negative for mouth sores, trouble swallowing, trouble swallowing and nose dryness.   Eyes: Negative for pain, redness, visual disturbance and dryness.  Respiratory: Negative for cough, hemoptysis, shortness of breath and difficulty breathing.   Cardiovascular: Negative for chest pain, palpitations, hypertension and swelling in legs/feet.  Gastrointestinal: Negative for blood in stool, constipation and diarrhea.  Endocrine: Negative for increased urination.  Genitourinary: Negative for painful urination.  Musculoskeletal: Positive for arthralgias, joint pain, joint swelling, morning stiffness and muscle tenderness. Negative for myalgias, muscle weakness and myalgias.  Skin: Negative for color change, pallor, rash, hair loss, nodules/bumps, skin tightness, ulcers and sensitivity to sunlight.  Allergic/Immunologic: Negative for susceptible to infections.  Neurological: Negative for dizziness, numbness and headaches.  Hematological: Negative for swollen glands.  Psychiatric/Behavioral: Negative for depressed mood and sleep disturbance. The patient is not nervous/anxious.     PMFS History:  Patient Active Problem List   Diagnosis Date Noted  . IDA (iron deficiency anemia) 12/22/2016  . Primary osteoarthritis of both knees 10/03/2016  . DDD (degenerative disc disease), cervical 10/03/2016  . DDD (degenerative disc disease), lumbar 10/03/2016  . History  of total hip replacement, right 10/03/2016  . History of hypothyroidism 10/03/2016  . History of hypertension 10/03/2016  . History of anemia 10/03/2016  . Chondromalacia 10/03/2016  . Primary osteoarthritis of shoulder 10/03/2016  . History of high cholesterol 10/03/2016  . Primary osteoarthritis of both hands 10/03/2016  . Abnormal  nuclear stress test 08/23/2015  . Globus sensation 08/13/2015  . Special screening for malignant neoplasms, colon 08/13/2015  . Chest pain at rest   . Chest pain with moderate risk of acute coronary syndrome 07/28/2015  . Annual physical exam 07/11/2015  . RLS (restless legs syndrome) 11/01/2014  . Insomnia 11/01/2014  . Thoracic back pain 01/12/2014  . Allergic rhinitis 11/03/2013  . Preventative health care 12/13/2012  . Recurrent epistaxis 02/20/2012  . Shoulder pain, bilateral 11/01/2011  . Arthralgia of both knees 10/18/2011  . Peripheral edema 09/10/2011  . Asthma 08/19/2011  . Palpitations 08/01/2011  . Anemia 08/01/2011  . Hypokalemia 08/01/2011  . Elevated WBC count 08/01/2011  . GERD (gastroesophageal reflux disease)   . Low back pain radiating to right leg   . Morbid obesity ([place]) 07/17/2011  . Hypothyroidism 07/17/2011  . Hyperlipidemia 07/17/2011  . Essential hypertension 07/17/2011    Past Medical History:  Diagnosis Date  . Abnormal nuclear stress test 08/23/2015  . Arthritis   . Chicken pox as a child  . Essential hypertension 07/17/2011   ACEI d/c 06/2011 for refractory cough> resolved   . GERD (gastroesophageal reflux disease)   . Hyperlipidemia   . Hypertension   . Hypothyroidism   . Insomnia 11/01/2014  . Low back pain radiating to right leg    intermittent and occasional numbness of skin over right hip in certain positions  . Measles as a child  . Mumps as a child  . Obesity   . Pneumonia    this October from which she has said inhailer  . Recurrent epistaxis 02/20/2012  . Rheumatoid arthritis ([place]) 09/10/2011   Bone on bone in knees Pain in L>R knees.   . RLS (restless legs syndrome) 11/01/2014    Family History  Problem Relation Age of Onset  . Lung cancer Mother 92       smoker  . Heart Problems Mother        tachycardia  . Hearing loss Mother   . Anxiety disorder Mother        anxiety, claustrophobia  . Osteoarthritis Father   .  Hyperlipidemia Father   . Anuerysm Father        AAA  . Hyperlipidemia Sister   . Migraines Sister   . Hypertension Sister   . Colon cancer Maternal Grandmother   . Heart attack Maternal Grandfather   . Aneurysm Paternal Grandfather        abdominal  . Heart disease Paternal Grandfather        AAA rupture, smoker  . Heart Problems Sister        tachycardia   Past Surgical History:  Procedure Laterality Date  . CARDIAC CATHETERIZATION N/A 08/23/2015   Procedure: Left Heart Cath and Coronary Angiography;  Surgeon: [name], MD;  Location: [facility];  Service: Cardiovascular;  Laterality: N/A;  . CESAREAN SECTION     X 3  . INGUINAL HERNIA REPAIR Right 67 yrs old  . THYROIDECTOMY     total for benign tumor, Parathyroid spared  . TONSILLECTOMY    . TOTAL HIP ARTHROPLASTY Right 2006   secondary to congenital  hip defect  Social History   Social History Narrative  . Not on file     Objective: Vital Signs: BP 138/74 (BP Location: Left Arm, Patient Position: Sitting, Cuff Size: Large)   Pulse 79   Resp 14   Wt 297 lb (134.7 kg)   BMI 53.89 kg/m    Physical Exam  Constitutional: She is oriented to person, place, and time. She appears well-developed and well-nourished.  HENT:  Head: Normocephalic and atraumatic.  Eyes: Conjunctivae and EOM are normal.  Neck: Normal range of motion.  Cardiovascular: Normal rate, regular rhythm, normal heart sounds and intact distal pulses.  Pulmonary/Chest: Effort normal and breath sounds normal.  Abdominal: Soft. Bowel sounds are normal.  Lymphadenopathy:    She has no cervical adenopathy.  Neurological: She is alert and oriented to person, place, and time.  Skin: Skin is warm and dry. Capillary refill takes less than 2 seconds.  Psychiatric: She has a normal mood and affect. Her behavior is normal.  Nursing note and vitals reviewed.    Musculoskeletal Exam: C-spine, thoracic spine, lumbar spine good range of motion.   No midline spinal tenderness.  No SI joint tenderness.  Shoulder joints, elbow joints, wrist joints, MCPs, PIPs, DIPs good range of motion with no synovitis.  She has tenderness of bilateral CMC joints.  She has PIP and DIP synovial thickening consistent with osteoarthritis.  She has subluxation of several DIP joints.  Hip joints, knee joints, ankle joints, MTPs, PIPs, and DIPs good ROM with no synovitis.  No warmth or effusion on exam. No trochanteric bursa tenderness.   CDAI Exam: No CDAI exam completed.    Investigation: No additional findings. CBC Latest Ref Rng & Units 06/22/2017 12/21/2016 12/04/2016  WBC 3.9 - 10.0 10e3/uL 14.3(H) 11.9(H) 15.9(H)  Hemoglobin 11.6 - 15.9 g/dL 14.2 12.2 11.8(L)  Hematocrit 34.8 - 46.6 % 42.6 38.6 37.0  Platelets 145 - 400 10e3/uL 459(H) 462(H) 504.0(H)   CMP Latest Ref Rng & Units 06/22/2017 12/21/2016 11/27/2016  Glucose 73 - 118 mg/dL 101 97 83  BUN 7 - 22 mg/dL 21 22.4 26(H)  Creatinine 0.6 - 1.2 mg/dl 0.9 0.8 0.82  Sodium 128 - 145 mEq/L 143 142 139  Potassium 3.3 - 4.7 mEq/L 3.1(L) 3.5 5.1  Chloride 98 - 108 mEq/L 99 - 103  CO2 18 - 33 mEq/L 32 30(H) 28  Calcium 8.0 - 10.3 mg/dL 8.9 9.2 9.5  Total Protein 6.4 - 8.1 g/dL 7.4 6.8 6.7  Total Bilirubin 0.20 - 1.60 mg/dl 0.80 0.29 0.3  Alkaline Phos 26 - 84 U/L 99(H) 114 88  AST 11 - 38 U/L 19 15 20   ALT 10 - 47 U/L 19 13 17     Imaging: No results found.  Speciality Comments: No specialty comments available.    Procedures:  Large Joint Inj: bilateral knee on 10/03/2017 2:03 PM Indications: pain Details: 27 G 1.5 in needle, medial approach  Arthrogram: No  Medications (Right): 1.5 mL lidocaine 1 %; 40 mg triamcinolone acetonide 40 MG/ML Aspirate (Right): 0 mL Medications (Left): 1.5 mL lidocaine 1 %; 40 mg triamcinolone acetonide 40 MG/ML Aspirate (Left): 0 mL Outcome: tolerated well, no immediate complications Procedure, treatment alternatives, risks and benefits explained, specific  risks discussed. Consent was given by the patient. Immediately prior to procedure a time out was called to verify the correct patient, procedure, equipment, support staff and site/side marked as required. Patient was prepped and draped in the usual sterile fashion.     Allergies: Bee venom  and Accupril [quinapril hcl]   Assessment / Plan:     Visit Diagnoses: Primary osteoarthritis of both hands: She has severe osteoarthritis in her hands.  She has significant PIP and DIP synovial thickening consistent with osteoarthritis.  She has subluxation of several DIP joints.  Joint protection and muscle strengthening were discussed.  Primary osteoarthritis of both knees: No warmth or effusion of bilateral knees.  She has been having discomfort in bilateral knees.  She requested bilateral cortisone injections today.  She tolerated the procedure well.  She would like to reapply for Hyalgan injections.  We will apply Hyalgan injections in May 2019.  She was advised to monitor blood pressure closely following the cortisone injections today.  Primary osteoarthritis of both shoulders: She has good range of motion on exam today. No warmth or effusion.  No crepitus bilaterally.  History of total hip replacement, right: Doing well.  Good range of motion on exam.  DDD (degenerative disc disease), cervical: Doing well.  Good range of motion on exam.  DDD (degenerative disc disease), lumbar: She has no midline spinal tenderness.  She experiences occasional discomfort in her lumbar spine.  Primary insomnia: She takes Ambien as needed at bedtime.   Other medical conditions are listed as follows:  Peripheral edema  History of hypothyroidism  History of obesity  History of hypertension: She was advised to event monitor her blood pressure closely following the cortisone injections.  History of anemia  History of high cholesterol    Orders: Orders Placed This Encounter  Procedures  . Large Joint Inj    No orders of the defined types were placed in this encounter.   Face-to-face time spent with patient was 30 minutes. >50% of time was spent in counseling and coordination of care.  Follow-Up Instructions: Return in about 6 months (around 04/05/2018) for Osteoarthritis.   Ofilia Neas, PA-C   I examined and evaluated the patient with Hazel Sams PA. The plan of care was discussed as noted above.  Bo Merino, MD  Note - This record has been created using Editor, commissioning.  Chart creation errors have been sought, but may not always  have been located. Such creation errors do not reflect on  the standard of medical care.

## 2017-09-25 ENCOUNTER — Other Ambulatory Visit: Payer: Self-pay | Admitting: *Deleted

## 2017-09-25 MED ORDER — FLUTICASONE PROPIONATE 50 MCG/ACT NA SUSP: NASAL | 1 refills | Status: DC

## 2017-09-25 MED ORDER — HYDROCHLOROTHIAZIDE 25 MG PO TABS: 25.0000 mg | ORAL_TABLET | Freq: Every day | ORAL | 1 refills | Status: DC

## 2017-09-25 MED ORDER — PANTOPRAZOLE SODIUM 40 MG PO TBEC: 40.0000 mg | DELAYED_RELEASE_TABLET | Freq: Every day | ORAL | 1 refills | Status: DC

## 2017-09-26 ENCOUNTER — Other Ambulatory Visit: Payer: Self-pay | Admitting: Family Medicine

## 2017-09-28 MED ORDER — FAMOTIDINE 20 MG PO TABS: 20.0000 mg | ORAL_TABLET | Freq: Every day | ORAL | 0 refills | Status: DC

## 2017-10-02 DIAGNOSIS — Z1231 Encounter for screening mammogram for malignant neoplasm of breast: Secondary | ICD-10-CM | POA: Diagnosis not present

## 2017-10-02 DIAGNOSIS — Z01419 Encounter for gynecological examination (general) (routine) without abnormal findings: Secondary | ICD-10-CM | POA: Diagnosis not present

## 2017-10-02 DIAGNOSIS — R3915 Urgency of urination: Secondary | ICD-10-CM | POA: Diagnosis not present

## 2017-10-02 DIAGNOSIS — Z6841 Body Mass Index (BMI) 40.0 and over, adult: Secondary | ICD-10-CM | POA: Diagnosis not present

## 2017-10-03 ENCOUNTER — Encounter: Payer: Self-pay | Admitting: Rheumatology

## 2017-10-03 ENCOUNTER — Ambulatory Visit: Payer: Medicare Other | Admitting: Rheumatology

## 2017-10-03 VITALS — BP 138/74 | HR 79 | Resp 14 | Wt 297.0 lb

## 2017-10-03 DIAGNOSIS — F5101 Primary insomnia: Secondary | ICD-10-CM | POA: Diagnosis not present

## 2017-10-03 DIAGNOSIS — M170 Bilateral primary osteoarthritis of knee: Secondary | ICD-10-CM | POA: Diagnosis not present

## 2017-10-03 DIAGNOSIS — M5030 Other cervical disc degeneration, unspecified cervical region: Secondary | ICD-10-CM

## 2017-10-03 DIAGNOSIS — M19011 Primary osteoarthritis, right shoulder: Secondary | ICD-10-CM

## 2017-10-03 DIAGNOSIS — M19012 Primary osteoarthritis, left shoulder: Secondary | ICD-10-CM

## 2017-10-03 DIAGNOSIS — Z862 Personal history of diseases of the blood and blood-forming organs and certain disorders involving the immune mechanism: Secondary | ICD-10-CM | POA: Diagnosis not present

## 2017-10-03 DIAGNOSIS — M19042 Primary osteoarthritis, left hand: Secondary | ICD-10-CM | POA: Diagnosis not present

## 2017-10-03 DIAGNOSIS — M5136 Other intervertebral disc degeneration, lumbar region: Secondary | ICD-10-CM

## 2017-10-03 DIAGNOSIS — M19041 Primary osteoarthritis, right hand: Secondary | ICD-10-CM

## 2017-10-03 DIAGNOSIS — Z8679 Personal history of other diseases of the circulatory system: Secondary | ICD-10-CM

## 2017-10-03 DIAGNOSIS — Z96641 Presence of right artificial hip joint: Secondary | ICD-10-CM

## 2017-10-03 DIAGNOSIS — R600 Localized edema: Secondary | ICD-10-CM

## 2017-10-03 DIAGNOSIS — R609 Edema, unspecified: Secondary | ICD-10-CM

## 2017-10-03 DIAGNOSIS — Z8639 Personal history of other endocrine, nutritional and metabolic disease: Secondary | ICD-10-CM | POA: Diagnosis not present

## 2017-10-03 DIAGNOSIS — M51369 Other intervertebral disc degeneration, lumbar region without mention of lumbar back pain or lower extremity pain: Secondary | ICD-10-CM

## 2017-10-03 MED ORDER — LIDOCAINE HCL 1 % IJ SOLN
1.5000 mL | INTRAMUSCULAR | Status: AC | PRN
  Administered 2017-10-03: 1.5 mL

## 2017-10-03 MED ORDER — TRIAMCINOLONE ACETONIDE 40 MG/ML IJ SUSP
40.0000 mg | INTRAMUSCULAR | Status: AC | PRN
  Administered 2017-10-03: 40 mg via INTRA_ARTICULAR

## 2017-10-07 ENCOUNTER — Other Ambulatory Visit: Payer: Self-pay | Admitting: Family Medicine

## 2017-10-09 DIAGNOSIS — E8881 Metabolic syndrome: Secondary | ICD-10-CM | POA: Diagnosis not present

## 2017-10-09 DIAGNOSIS — D509 Iron deficiency anemia, unspecified: Secondary | ICD-10-CM | POA: Diagnosis not present

## 2017-10-09 DIAGNOSIS — Z6841 Body Mass Index (BMI) 40.0 and over, adult: Secondary | ICD-10-CM | POA: Diagnosis not present

## 2017-10-09 DIAGNOSIS — R635 Abnormal weight gain: Secondary | ICD-10-CM | POA: Diagnosis not present

## 2017-10-09 DIAGNOSIS — E538 Deficiency of other specified B group vitamins: Secondary | ICD-10-CM | POA: Diagnosis not present

## 2017-10-09 DIAGNOSIS — E559 Vitamin D deficiency, unspecified: Secondary | ICD-10-CM | POA: Diagnosis not present

## 2017-10-09 DIAGNOSIS — I10 Essential (primary) hypertension: Secondary | ICD-10-CM | POA: Diagnosis not present

## 2017-10-10 ENCOUNTER — Encounter: Payer: Self-pay | Admitting: Family Medicine

## 2017-10-10 ENCOUNTER — Ambulatory Visit (HOSPITAL_COMMUNITY)
Admission: EM | Admit: 2017-10-10 | Discharge: 2017-10-10 | Disposition: A | Discharge status: Home / Self Care | Payer: Medicare Other | Attending: Family Medicine | Admitting: Family Medicine

## 2017-10-10 ENCOUNTER — Encounter (HOSPITAL_COMMUNITY): Payer: Self-pay | Admitting: Emergency Medicine

## 2017-10-10 ENCOUNTER — Other Ambulatory Visit: Payer: Self-pay

## 2017-10-10 DIAGNOSIS — R002 Palpitations: Secondary | ICD-10-CM | POA: Diagnosis not present

## 2017-10-10 NOTE — ED Triage Notes (Signed)
States was having neck pain, feels like a muscle tightening and tachycardia

## 2017-10-10 NOTE — Discharge Instructions (Signed)
Please return here or to the Emergency Department immediately should you feel worse in any way or have any of the following symptoms: increasing or different chest pain or palpitations, pain that spreads to your arm, neck, jaw, back or abdomen, shortness of breath, or nausea and vomiting.

## 2017-10-11 NOTE — ED Provider Notes (Signed)
Hector   [id number] 10/10/17 Arrival Time: 7619  ASSESSMENT & PLAN:  1. Palpitations    Single episode this morning. She is comfortable with monitoring symptoms at home. Precautions given to proceed to ED should symptoms return and/or worsen. ECG without acute abnormalities this evening. Chest pain precautions given.  Reviewed expectations re: course of current medical issues. Questions answered. Outlined signs and symptoms indicating need for more acute intervention. Patient verbalized understanding. After Visit Summary given.   SUBJECTIVE:  Rebecca Brooks is a 67 y.o. female who presents with complaint of waking this morning and "feeling like my heart was racing." Onset abrupt, 12 hours ago, with resolved course since that time. Her Apple watch reported HR from 44-144. Symptoms lasted approx 2 hours and resolved. She stayed in bed during episode. No associated n/v/diaphoresis/SOB/CP. "Did feel like a muscle was tight in my neck." No recurrence of symptoms. "Feeling normal now." Reports negative LHC last year as well as normal echocardiogram. No LE edema. No recent medication changes or illnesses. OTC treatment: none.  Social History   Tobacco Use  Smoking Status Former Smoker  . Packs/day: 0.50  . Years: 5.00  . Pack years: 2.50  . Types: Cigarettes  . Last attempt to quit: 07/25/1971  . Years since quitting: 46.2  Smokeless Tobacco Never Used    ROS: As per HPI.   OBJECTIVE:  Vitals:   10/10/17 1655  BP: (!) 149/80  Pulse: 73  Temp: (!) 97.4 F (36.3 C)  TempSrc: Oral  SpO2: 100%    General appearance: alert; no distress Eyes: PERRLA; EOMI; conjunctiva normal HENT: normocephalic; atraumatic Neck: supple Lungs: clear to auscultation bilaterally Heart: regular rate and rhythm Chest Wall: non-tender Abdomen: soft, non-tender; bowel sounds normal; no masses or organomegaly; no guarding or rebound tenderness Extremities: no cyanosis or edema;  symmetrical with no gross deformities Skin: warm and dry Psychological: alert and cooperative; normal mood and affect  ECG: Orders placed or performed during the hospital encounter of 10/10/17  . ED EKG  . ED EKG    Allergies  Allergen Reactions  . Bee Venom Anaphylaxis  . Accupril [Quinapril Hcl] Cough    Past Medical History:  Diagnosis Date  . Abnormal nuclear stress test 08/23/2015  . Arthritis   . Chicken pox as a child  . Essential hypertension 07/17/2011   ACEI d/c 06/2011 for refractory cough> resolved   . GERD (gastroesophageal reflux disease)   . Hyperlipidemia   . Hypertension   . Hypothyroidism   . Insomnia 11/01/2014  . Low back pain radiating to right leg    intermittent and occasional numbness of skin over right hip in certain positions  . Measles as a child  . Mumps as a child  . Obesity   . Pneumonia    this October from which she has said inhailer  . Recurrent epistaxis 02/20/2012  . Rheumatoid arthritis ([place]) 09/10/2011   Bone on bone in knees Pain in L>R knees.   . RLS (restless legs syndrome) 11/01/2014   Social History   Socioeconomic History  . Marital status: Married    Spouse name: Not on file  . Number of children: 3  . Years of education: Not on file  . Highest education level: Not on file  Occupational History  . Occupation: Training and development officer  . Occupation: Primary school teacher  Social Needs  . Financial resource strain: Not on file  . Food insecurity:    Worry: Not on file    Inability:  Not on file  . Transportation needs:    Medical: Not on file    Non-medical: Not on file  Tobacco Use  . Smoking status: Former Smoker    Packs/day: 0.50    Years: 5.00    Pack years: 2.50    Types: Cigarettes    Last attempt to quit: 07/25/1971    Years since quitting: 46.2  . Smokeless tobacco: Never Used  Substance and Sexual Activity  . Alcohol use: Yes    Comment: socially  . Drug use: No  . 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 Physical activity:    Days per week: Not on file    Minutes per session: Not on file  . Stress: Not on file  Relationships  . Social connections:    Talks on phone: Not on file    Gets together: Not on file    Attends religious service: Not on file    Active member of club or organization: Not on file    Attends meetings of clubs or organizations: Not on file    Relationship status: Not on file  . Intimate partner violence:    Fear of current or ex partner: Not on file    Emotionally abused: Not on file    Physically abused: Not on file    Forced sexual activity: Not on file  Other Topics Concern  . Not on file  Social History Narrative  . Not on file   Family History  Problem Relation Age of Onset  . Lung cancer Mother 34       smoker  . Heart Problems Mother        tachycardia  . Hearing loss Mother   . Anxiety disorder Mother        anxiety, claustrophobia  . Osteoarthritis Father   . Hyperlipidemia Father   . Anuerysm Father        AAA  . Hyperlipidemia Sister   . Migraines Sister   . Hypertension Sister   . Colon cancer Maternal Grandmother   . Heart attack Maternal Grandfather   . Aneurysm Paternal Grandfather        abdominal  . Heart disease Paternal Grandfather        AAA rupture, smoker  . Heart Problems Sister        tachycardia   Past Surgical History:  Procedure Laterality Date  . CARDIAC CATHETERIZATION N/A 08/23/2015   Procedure: Left Heart Cath and Coronary Angiography;  Surgeon: [name], MD;  Location: [facility];  Service: Cardiovascular;  Laterality: N/A;  . CESAREAN SECTION     X 3  . INGUINAL HERNIA REPAIR Right 67 yrs old  . THYROIDECTOMY     total for benign tumor, Parathyroid spared  . TONSILLECTOMY    . TOTAL HIP ARTHROPLASTY Right 2006   secondary to congenital  hip defect      Vanessa Kick, MD 10/11/17 [phone]

## 2017-10-18 ENCOUNTER — Other Ambulatory Visit: Payer: Self-pay | Admitting: Family Medicine

## 2017-10-24 ENCOUNTER — Other Ambulatory Visit: Payer: Self-pay | Admitting: Family Medicine

## 2017-11-04 ENCOUNTER — Other Ambulatory Visit: Payer: Self-pay | Admitting: Family Medicine

## 2017-11-20 DIAGNOSIS — I10 Essential (primary) hypertension: Secondary | ICD-10-CM | POA: Diagnosis not present

## 2017-11-20 DIAGNOSIS — Z6841 Body Mass Index (BMI) 40.0 and over, adult: Secondary | ICD-10-CM | POA: Diagnosis not present

## 2017-11-20 DIAGNOSIS — E8881 Metabolic syndrome: Secondary | ICD-10-CM | POA: Diagnosis not present

## 2017-12-04 ENCOUNTER — Other Ambulatory Visit: Payer: Self-pay | Admitting: Family Medicine

## 2017-12-18 DIAGNOSIS — Z6841 Body Mass Index (BMI) 40.0 and over, adult: Secondary | ICD-10-CM | POA: Diagnosis not present

## 2017-12-18 DIAGNOSIS — Z713 Dietary counseling and surveillance: Secondary | ICD-10-CM | POA: Diagnosis not present

## 2017-12-18 DIAGNOSIS — Z01818 Encounter for other preprocedural examination: Secondary | ICD-10-CM | POA: Diagnosis not present

## 2017-12-21 ENCOUNTER — Encounter: Payer: Self-pay | Admitting: Family

## 2017-12-21 ENCOUNTER — Inpatient Hospital Stay: Payer: Medicare Other | Attending: Family | Admitting: Family

## 2017-12-21 ENCOUNTER — Other Ambulatory Visit: Payer: Self-pay

## 2017-12-21 ENCOUNTER — Inpatient Hospital Stay: Payer: Medicare Other

## 2017-12-21 VITALS — BP 137/60 | HR 81 | Temp 98.3°F | Resp 18 | Wt 300.0 lb

## 2017-12-21 DIAGNOSIS — D509 Iron deficiency anemia, unspecified: Secondary | ICD-10-CM | POA: Diagnosis not present

## 2017-12-21 DIAGNOSIS — R42 Dizziness and giddiness: Secondary | ICD-10-CM | POA: Diagnosis not present

## 2017-12-21 DIAGNOSIS — D508 Other iron deficiency anemias: Secondary | ICD-10-CM

## 2017-12-21 DIAGNOSIS — R531 Weakness: Secondary | ICD-10-CM | POA: Diagnosis not present

## 2017-12-21 DIAGNOSIS — D72829 Elevated white blood cell count, unspecified: Secondary | ICD-10-CM

## 2017-12-21 DIAGNOSIS — R002 Palpitations: Secondary | ICD-10-CM | POA: Diagnosis not present

## 2017-12-21 DIAGNOSIS — R0602 Shortness of breath: Secondary | ICD-10-CM | POA: Insufficient documentation

## 2017-12-21 DIAGNOSIS — D72828 Other elevated white blood cell count: Secondary | ICD-10-CM | POA: Insufficient documentation

## 2017-12-21 DIAGNOSIS — M1990 Unspecified osteoarthritis, unspecified site: Secondary | ICD-10-CM | POA: Insufficient documentation

## 2017-12-21 DIAGNOSIS — Z79899 Other long term (current) drug therapy: Secondary | ICD-10-CM | POA: Insufficient documentation

## 2017-12-21 LAB — CBC WITH DIFFERENTIAL (CANCER CENTER ONLY)
BASOS ABS: 0.1 10*3/uL (ref 0.0–0.1)
BASOS PCT: 1 %
EOS ABS: 0.1 10*3/uL (ref 0.0–0.5)
EOS PCT: 1 %
HCT: 41.1 % (ref 34.8–46.6)
Hemoglobin: 13.9 g/dL (ref 11.6–15.9)
Lymphocytes Relative: 14 %
Lymphs Abs: 1.7 10*3/uL (ref 0.9–3.3)
MCH: 31.4 pg (ref 26.0–34.0)
MCHC: 33.8 g/dL (ref 32.0–36.0)
MCV: 93 fL (ref 81.0–101.0)
MONO ABS: 0.5 10*3/uL (ref 0.1–0.9)
MONOS PCT: 4 %
NEUTROS ABS: 9.6 10*3/uL — AB (ref 1.5–6.5)
Neutrophils Relative %: 80 %
PLATELETS: 375 10*3/uL (ref 145–400)
RBC: 4.42 MIL/uL (ref 3.70–5.32)
RDW: 13.7 % (ref 11.1–15.7)
WBC Count: 12 10*3/uL — ABNORMAL HIGH (ref 3.9–10.0)

## 2017-12-21 LAB — CMP (CANCER CENTER ONLY)
ALBUMIN: 3.4 g/dL — AB (ref 3.5–5.0)
ALT: 17 U/L (ref 0–55)
ANION GAP: 12 — AB (ref 3–11)
AST: 16 U/L (ref 5–34)
Alkaline Phosphatase: 98 U/L (ref 40–150)
BILIRUBIN TOTAL: 0.5 mg/dL (ref 0.2–1.2)
BUN: 19 mg/dL (ref 7–26)
CHLORIDE: 102 mmol/L (ref 98–109)
CO2: 28 mmol/L (ref 22–29)
Calcium: 9.4 mg/dL (ref 8.4–10.4)
Creatinine: 0.76 mg/dL (ref 0.60–1.10)
GFR, Est AFR Am: >60 mL/min (ref >60)
GFR, Estimated: >60 mL/min (ref >60)
GLUCOSE: 123 mg/dL (ref 70–140)
POTASSIUM: 3.6 mmol/L (ref 3.5–5.1)
SODIUM: 142 mmol/L (ref 136–145)
TOTAL PROTEIN: 6.7 g/dL (ref 6.4–8.3)

## 2017-12-21 NOTE — Progress Notes (Signed)
Hematology and Oncology Follow Up Visit  Rebecca Brooks [id number] Nov 16, 1950 66 y.o. 12/21/2017   Principle Diagnosis:  Reactive leukocytosis Iron deficiency anemia  Thrombocytosis  Current Therapy:   IV iron as indicated - last received 2 doses in June    Interim History:  Rebecca Brooks is here today for follow-up. She is doing fairly well but still has some SOB with exertion. She has also had weakness and dizziness.  She will be having the gastric sleeve surgery on June 25th. She is excited but anxious about this. She states that she has had some chest discomfort and palpitations but feels that this is due to anxiety.  No fever, chills, n/v, cough, rash, SOB, chest pain, palpitations, abdominal pain or changes in bowel or bladder habits.  No lymphadenopathy noted on exam.  No swelling, tenderness, numbness or tingling in her extremities at this time. No c/o pain.  She has maintained a good appetite and is staying well hydrated. Her weight is stable.   ECOG Performance Status: 1 - Symptomatic but completely ambulatory  Medications:  Allergies as of 12/21/2017      Reactions   Bee Venom Anaphylaxis   Accupril [quinapril Hcl] Cough      Medication List        Accurate as of 12/21/17  1:50 PM. Always use your most recent med list.          albuterol 108 (90 Base) MCG/ACT inhaler Commonly known as:  PROAIR HFA Inhale 2 puffs into the lungs every 6 (six) hours as needed for wheezing or shortness of breath.   ALPRAZolam 0.25 MG tablet Commonly known as:  XANAX Take 1 tablet (0.25 mg total) by mouth daily as needed for anxiety.   ANTI-INFLAMMATORY ENZYME Caps APPLY 1 2 GMS TO AFFECTED AREA 3 4 TIMES DAILY AS NEEDED FOR PAIN  THIS IS A COMPOUNDED CREAM 1 PUMP EQUALS 1 GRAM   aspirin EC 81 MG tablet Take 1 tablet (81 mg total) by mouth daily.   cetirizine 10 MG tablet Commonly known as:  ZYRTEC Take 1 tablet (10 mg total) by mouth daily.   cetirizine 10 MG  tablet Commonly known as:  ZYRTEC TAKE 1 TABLET(10 MG) BY MOUTH DAILY   cetirizine 10 MG tablet Commonly known as:  ZYRTEC TAKE 1 TABLET(10 MG) BY MOUTH DAILY   diclofenac sodium 1 % Gel Commonly known as:  VOLTAREN APPLY 4 GRAMS TO 3 LARGE JOINTS THREE TIMES DAILY AS NEEDED   famotidine 20 MG tablet Commonly known as:  PEPCID Take 1 tablet (20 mg total) by mouth at bedtime.   famotidine 20 MG tablet Commonly known as:  PEPCID TAKE 1 TABLET(20 MG) BY MOUTH AT BEDTIME   fluticasone 50 MCG/ACT nasal spray Commonly known as:  FLONASE SHAKE LIQUID AND USE 2 SPRAYS IN EACH NOSTRIL DAILY   furosemide 20 MG tablet Commonly known as:  LASIX TAKE 2 TABLETS BY MOUTH DAILY FOR 5 DAYS THEN DECREASE TO 1 TABLET BY MOUTH DAILY AS NEEDED   hydrochlorothiazide 25 MG tablet Commonly known as:  HYDRODIURIL Take 1 tablet (25 mg total) by mouth daily.   hyoscyamine 0.125 MG SL tablet Commonly known as:  LEVSIN SL Place 1 tablet (0.125 mg total) under the tongue every 4 (four) hours as needed.   levothyroxine 100 MCG tablet Commonly known as:  SYNTHROID, LEVOTHROID TAKE 1 TABLET BY MOUTH DAILY BEFORE BREAKFAST   LOMAIRA 8 MG Tabs Generic drug:  Phentermine HCl Take 0.5 tablets by  mouth daily.   MELATONIN PO Take 1 tablet by mouth at bedtime as needed (sleep).   metoprolol tartrate 50 MG tablet Commonly known as:  LOPRESSOR TAKE 1 TABLET BY MOUTH TWICE DAILY   nitroGLYCERIN 0.4 MG SL tablet Commonly known as:  NITROSTAT Place 1 tablet (0.4 mg total) under the tongue every 5 (five) minutes as needed for chest pain.   pantoprazole 40 MG tablet Commonly known as:  PROTONIX Take 1 tablet (40 mg total) by mouth daily.   potassium chloride SA 20 MEQ tablet Commonly known as:  K-DUR,KLOR-CON TAKE 1 TABLET BY MOUTH DAILY AS NEEDED(DEPENDING ON LASIX USE)   rOPINIRole 0.5 MG tablet Commonly known as:  REQUIP TAKE 1 TO 2 TABLETS BY MOUTH EVERY NIGHT AT BEDTIME AS NEEDED    simvastatin 40 MG tablet Commonly known as:  ZOCOR TAKE 1 TABLET BY MOUTH EVERY EVENING   topiramate 25 MG tablet Commonly known as:  TOPAMAX   traMADol 50 MG tablet Commonly known as:  ULTRAM TAKE 1-2 TABLETS BY MOUTH EVERY 8 HOURS AS NEEDED   Vitamin B-12 1000 MCG Subl Place under the tongue.   Vitamin D3 1000 units Caps Take 1 capsule (1,000 Units total) daily by mouth.   zolpidem 10 MG tablet Commonly known as:  AMBIEN TAKE 1 TABLET BY MOUTH EVERY NIGHT AT BEDTIME AS NEEDED FOR SLEEP       Allergies:  Allergies  Allergen Reactions  . Bee Venom Anaphylaxis  . Accupril [Quinapril Hcl] Cough    Past Medical History, Surgical history, Social history, and Family History were reviewed and updated.  Review of Systems: All other 10 point review of systems is negative.   Physical Exam:  vitals were not taken for this visit.   Wt Readings from Last 3 Encounters:  10/03/17 297 lb (134.7 kg)  06/22/17 (!) 305 lb (138.3 kg)  03/06/17 (!) 310 lb (140.6 kg)    Ocular: Sclerae unicteric, pupils equal, round and reactive to light Ear-nose-throat: Oropharynx clear, dentition fair Lymphatic: No cervical, supraclavicular or axillary adenopathy Lungs no rales or rhonchi, good excursion bilaterally Heart regular rate and rhythm, no murmur appreciated Abd soft, nontender, positive bowel sounds, no liver or spleen tip palpated on exam, no fluid wave  MSK no focal spinal tenderness, no joint edema Neuro: non-focal, well-oriented, appropriate affect Breasts: Deferred   Lab Results  Component Value Date   WBC 12.0 (H) 12/21/2017   HGB 13.9 12/21/2017   HCT 41.1 12/21/2017   MCV 93.0 12/21/2017   PLT 375 12/21/2017   Lab Results  Component Value Date   FERRITIN 111 06/22/2017   IRON 57 06/22/2017   TIBC 351 06/22/2017   UIBC 295 06/22/2017   IRONPCTSAT 16 (L) 06/22/2017   Lab Results  Component Value Date   RBC 4.42 12/21/2017   No results found for: KPAFRELGTCHN,  LAMBDASER, KAPLAMBRATIO No results found for: IGGSERUM, IGA, IGMSERUM No results found for: Odetta Pink, SPEI   Chemistry      Component Value Date/Time   NA 143 06/22/2017 1311   NA 142 12/21/2016 0922   K 3.1 (L) 06/22/2017 1311   K 3.5 12/21/2016 0922   CL 99 06/22/2017 1311   CO2 32 06/22/2017 1311   CO2 30 (H) 12/21/2016 0922   BUN 21 06/22/2017 1311   BUN 22.4 12/21/2016 0922   CREATININE 0.9 06/22/2017 1311   CREATININE 0.8 12/21/2016 0922      Component Value Date/Time  CALCIUM 8.9 06/22/2017 1311   CALCIUM 9.2 12/21/2016 0922   ALKPHOS 99 (H) 06/22/2017 1311   ALKPHOS 114 12/21/2016 0922   AST 19 06/22/2017 1311   AST 15 12/21/2016 0922   ALT 19 06/22/2017 1311   ALT 13 12/21/2016 0922   BILITOT 0.80 06/22/2017 1311   BILITOT 0.29 12/21/2016 0922      Impression and Plan: Rebecca Brooks is a very pleasant 67 yo caucasian female with mild reactive leukocytosis with arthritis and iron deficiency anemia. She is symptomatic with weakness, dizziness, SOB with exertion and palpitations.  She will be having gastric sleeve surgery on June 25th.  We will see what her iron studies show and bring her back in next week for infusion if needed.  She will contact our office with any questions or concerns. We can certainly see her sooner if need be.   Laverna Peace, NP 5/31/20191:50 PM

## 2017-12-22 ENCOUNTER — Other Ambulatory Visit: Payer: Self-pay | Admitting: Family Medicine

## 2017-12-22 HISTORY — PX: GASTRECTOMY: SHX58

## 2017-12-24 LAB — IRON AND TIBC
Iron: 90 ug/dL (ref 41–142)
SATURATION RATIOS: 31 % (ref 21–57)
TIBC: 289 ug/dL (ref 236–444)
UIBC: 199 ug/dL

## 2017-12-24 LAB — FERRITIN: Ferritin: 218 ng/mL (ref 9–269)

## 2017-12-24 NOTE — Telephone Encounter (Signed)
Requesting: ULTRAM  50 MG Contract: 12/21/16 UDS: 06/22/17 Last OV: 11/27/16 Next OV:01/01/18 Last Refill: 09/20/17   Please advise

## 2018-01-01 ENCOUNTER — Ambulatory Visit: Payer: Medicare Other | Admitting: Family Medicine

## 2018-01-01 ENCOUNTER — Encounter: Payer: Self-pay | Admitting: Family Medicine

## 2018-01-01 VITALS — BP 112/82 | HR 77 | Temp 97.8°F | Resp 18 | Wt 295.2 lb

## 2018-01-01 DIAGNOSIS — Z79899 Other long term (current) drug therapy: Secondary | ICD-10-CM | POA: Diagnosis not present

## 2018-01-01 DIAGNOSIS — G4700 Insomnia, unspecified: Secondary | ICD-10-CM | POA: Diagnosis not present

## 2018-01-01 DIAGNOSIS — E782 Mixed hyperlipidemia: Secondary | ICD-10-CM

## 2018-01-01 DIAGNOSIS — R002 Palpitations: Secondary | ICD-10-CM

## 2018-01-01 DIAGNOSIS — E039 Hypothyroidism, unspecified: Secondary | ICD-10-CM | POA: Diagnosis not present

## 2018-01-01 DIAGNOSIS — R252 Cramp and spasm: Secondary | ICD-10-CM

## 2018-01-01 LAB — MAGNESIUM: Magnesium: 1.8 mg/dL (ref 1.5–2.5)

## 2018-01-01 MED ORDER — ZOLPIDEM TARTRATE 10 MG PO TABS: 10.0000 mg | ORAL_TABLET | Freq: Every evening | ORAL | 1 refills | Status: DC | PRN

## 2018-01-01 NOTE — Progress Notes (Signed)
Subjective:  I acted as a Education administrator for Dr. Charlett Blake. [place], [place]  [name], female    DOB: 1951/07/12, 68 y.o.   MRN: [id number]  No chief complaint on file.   HPI  Patient is in today for a blood pressure follow up. She is preparing for gastric bypass surgery and has already changed her diet and lost weight. Is trying to stay active but her hips and knees hurt. They have taken her off of NSAIDS and restarted Tramadol. Which she takes sparingly. Denies CP/palp/SOB/HA/congestion/fevers/GI or GU c/o. Taking meds as prescribed  Patient Care Team: Mosie Lukes, MD as PCP - General (Family Medicine)   Past Medical History:  Diagnosis Date  . Abnormal nuclear stress test 08/23/2015  . Arthritis   . Chicken pox as a child  . Essential hypertension 07/17/2011   ACEI d/c 06/2011 for refractory cough> resolved   . GERD (gastroesophageal reflux disease)   . Hyperlipidemia   . Hypertension   . Hypothyroidism   . Insomnia 11/01/2014  . Low back pain radiating to right leg    intermittent and occasional numbness of skin over right hip in certain positions  . Measles as a child  . Mumps as a child  . Obesity   . Pneumonia    this October from which she has said inhailer  . Recurrent epistaxis 02/20/2012  . Rheumatoid arthritis ([place]) 09/10/2011   Bone on bone in knees Pain in L>R knees.   . RLS (restless legs syndrome) 11/01/2014    Past Surgical History:  Procedure Laterality Date  . CARDIAC CATHETERIZATION N/A 08/23/2015   Procedure: Left Heart Cath and Coronary Angiography;  Surgeon: [name], MD;  Location: [facility];  Service: Cardiovascular;  Laterality: N/A;  . CESAREAN SECTION     X 3  . INGUINAL HERNIA REPAIR Right 67 yrs old  . THYROIDECTOMY     total for benign tumor, Parathyroid spared  . TONSILLECTOMY    . TOTAL HIP ARTHROPLASTY Right 2006   secondary to congenital  hip defect    Family History  Problem Relation Age of Onset  . Lung  cancer Mother 62       smoker  . Heart Problems Mother        tachycardia  . Hearing loss Mother   . Anxiety disorder Mother        anxiety, claustrophobia  . Osteoarthritis Father   . Hyperlipidemia Father   . Anuerysm Father        AAA  . Hyperlipidemia Sister   . Migraines Sister   . Hypertension Sister   . Colon cancer Maternal Grandmother   . Heart attack Maternal Grandfather   . Aneurysm Paternal Grandfather        abdominal  . Heart disease Paternal Grandfather        AAA rupture, smoker  . Heart Problems Sister        tachycardia    Social History   Socioeconomic History  . Marital status: Married    Spouse name: Not on file  . Number of children: 3  . Years of education: Not on file  . Highest education level: Not on file  Occupational History  . Occupation: Training and development officer  . Occupation: Primary school teacher  Social Needs  . Financial resource strain: Not on file  . Food insecurity:    Worry: Not on file    Inability: Not on file  . Transportation needs:    Medical:  Not on file    Non-medical: Not on file  Tobacco Use  . Smoking status: Former Smoker    Packs/day: 0.50    Years: 5.00    Pack years: 2.50    Types: Cigarettes    Last attempt to quit: 07/25/1971    Years since quitting: 46.4  . Smokeless tobacco: Never Used  Substance and Sexual Activity  . Alcohol use: Yes    Comment: socially  . Drug use: No  . 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 Physical activity:    Days per week: Not on file    Minutes per session: Not on file  . Stress: Not on file  Relationships  . Social connections:    Talks on phone: Not on file    Gets together: Not on file    Attends religious service: Not on file    Active member of club or organization: Not on file    Attends meetings of clubs or organizations: Not on file    Relationship status: Not on file  . Intimate partner violence:    Fear of current or ex  partner: Not on file    Emotionally abused: Not on file    Physically abused: Not on file    Forced sexual activity: Not on file  Other Topics Concern  . Not on file  Social History Narrative  . Not on file    Outpatient Medications Prior to Visit  Medication Sig Dispense Refill  . albuterol (PROAIR HFA) 108 (90 Base) MCG/ACT inhaler Inhale 2 puffs into the lungs every 6 (six) hours as needed for wheezing or shortness of breath. 8.5 Inhaler 6  . ALPRAZolam (XANAX) 0.25 MG tablet Take 1 tablet (0.25 mg total) by mouth daily as needed for anxiety. 5 tablet 0  . aspirin EC 81 MG tablet Take 1 tablet (81 mg total) by mouth daily.    . cetirizine (ZYRTEC) 10 MG tablet Take 1 tablet (10 mg total) by mouth daily. 90 tablet 0  . Cholecalciferol (VITAMIN D3) 1000 units CAPS Take 1 capsule (1,000 Units total) daily by mouth. (Patient taking differently: Take 2 capsules by mouth daily. ) 90 capsule 0  . Cyanocobalamin (VITAMIN B-12) 1000 MCG SUBL Place under the tongue.    . diclofenac sodium (VOLTAREN) 1 % GEL APPLY 4 GRAMS TO 3 LARGE JOINTS THREE TIMES DAILY AS NEEDED 500 g 0  . famotidine (PEPCID) 20 MG tablet Take 1 tablet (20 mg total) by mouth at bedtime. 90 tablet 0  . fluticasone (FLONASE) 50 MCG/ACT nasal spray SHAKE LIQUID AND USE 2 SPRAYS IN EACH NOSTRIL DAILY 48 g 1  . furosemide (LASIX) 20 MG tablet TAKE 2 TABLETS BY MOUTH DAILY FOR 5 DAYS THEN DECREASE TO 1 TABLET BY MOUTH DAILY AS NEEDED 180 tablet 3  . hydrochlorothiazide (HYDRODIURIL) 25 MG tablet Take 1 tablet (25 mg total) by mouth daily. 90 tablet 1  . levothyroxine (SYNTHROID, LEVOTHROID) 100 MCG tablet TAKE 1 TABLET BY MOUTH DAILY BEFORE BREAKFAST 90 tablet 0  . MELATONIN PO Take 1 tablet by mouth at bedtime as needed (sleep).    . metoprolol tartrate (LOPRESSOR) 50 MG tablet TAKE 1 TABLET BY MOUTH TWICE DAILY 180 tablet 0  . nitroGLYCERIN (NITROSTAT) 0.4 MG SL tablet Place 1 tablet (0.4 mg total) under the tongue every 5 (five)  minutes as needed for chest pain. 25 tablet 3  . Nutritional Supplements (ANTI-INFLAMMATORY  ENZYME) CAPS APPLY 1 2 GMS TO AFFECTED AREA 3 4 TIMES DAILY AS NEEDED FOR PAIN  THIS IS A COMPOUNDED CREAM 1 PUMP EQUALS 1 GRAM  1  . pantoprazole (PROTONIX) 40 MG tablet Take 1 tablet (40 mg total) by mouth daily. 90 tablet 1  . rOPINIRole (REQUIP) 0.5 MG tablet TAKE 1 TO 2 TABLETS BY MOUTH EVERY NIGHT AT BEDTIME AS NEEDED 180 tablet 0  . simvastatin (ZOCOR) 40 MG tablet TAKE 1 TABLET BY MOUTH EVERY EVENING 90 tablet 0  . topiramate (TOPAMAX) 25 MG tablet   0  . traMADol (ULTRAM) 50 MG tablet TAKE 1 TO 2 TABLETS BY MOUTH EVERY 8 HOURS AS NEEDED 90 tablet 0  . zolpidem (AMBIEN) 10 MG tablet TAKE 1 TABLET BY MOUTH EVERY NIGHT AT BEDTIME AS NEEDED FOR SLEEP 30 tablet 0  . hyoscyamine (LEVSIN SL) 0.125 MG SL tablet Place 1 tablet (0.125 mg total) under the tongue every 4 (four) hours as needed. (Patient not taking: Reported on 10/03/2017) 30 tablet 0  . potassium chloride SA (K-DUR,KLOR-CON) 20 MEQ tablet TAKE 1 TABLET BY MOUTH DAILY AS NEEDED(DEPENDING ON LASIX USE) (Patient not taking: Reported on 01/01/2018) 90 tablet 3   No facility-administered medications prior to visit.     Allergies  Allergen Reactions  . Bee Venom Anaphylaxis  . Accupril [Quinapril Hcl] Cough  . Quinapril-Hydrochlorothiazide Cough    Review of Systems  Constitutional: Positive for malaise/fatigue. Negative for chills and fever.  HENT: Negative for congestion and hearing loss.   Eyes: Negative for discharge.  Respiratory: Negative for cough, sputum production and shortness of breath.   Cardiovascular: Negative for chest pain, palpitations and leg swelling.  Gastrointestinal: Negative for abdominal pain, blood in stool, constipation, diarrhea, heartburn, nausea and vomiting.  Genitourinary: Negative for dysuria, frequency, hematuria and urgency.  Musculoskeletal: Positive for back pain and joint pain. Negative for falls and  myalgias.  Skin: Negative for rash.  Neurological: Negative for dizziness, sensory change, loss of consciousness, weakness and headaches.  Endo/Heme/Allergies: Negative for environmental allergies. Does not bruise/bleed easily.  Psychiatric/Behavioral: Negative for depression and suicidal ideas. The patient is not nervous/anxious and does not have insomnia.        Objective:    Physical Exam  Constitutional: She is oriented to person, place, and time. No distress.  HENT:  Head: Normocephalic and atraumatic.  Right Ear: External ear normal.  Left Ear: External ear normal.  Nose: Nose normal.  Mouth/Throat: Oropharynx is clear and moist. No oropharyngeal exudate.  Eyes: Pupils are equal, round, and reactive to light. Conjunctivae are normal. Right eye exhibits no discharge. Left eye exhibits no discharge. No scleral icterus.  Neck: Normal range of motion. Neck supple. No thyromegaly present.  Cardiovascular: Normal rate, regular rhythm, normal heart sounds and intact distal pulses.  No murmur heard. Pulmonary/Chest: Effort normal and breath sounds normal. No respiratory distress. She has no wheezes. She has no rales.  Abdominal: Soft. Bowel sounds are normal. She exhibits no distension and no mass. There is no tenderness.  Musculoskeletal: Normal range of motion. She exhibits no edema or tenderness.  Lymphadenopathy:    She has no cervical adenopathy.  Neurological: She is alert and oriented to person, place, and time. She has normal reflexes. She displays normal reflexes. No cranial nerve deficit. Coordination normal.  Skin: Skin is warm and dry. No rash noted. She is not diaphoretic.    BP 112/82 (BP Location: Left Arm, Patient Position: Sitting, Cuff Size: Normal)  Pulse 77   Temp 97.8 F (36.6 C) (Oral)   Resp 18   Wt 295 lb 3.2 oz (133.9 kg)   SpO2 97%   BMI 53.56 kg/m  Wt Readings from Last 3 Encounters:  01/01/18 295 lb 3.2 oz (133.9 kg)  12/21/17 300 lb (136.1 kg)    10/03/17 297 lb (134.7 kg)   BP Readings from Last 3 Encounters:  01/01/18 112/82  12/21/17 137/60  10/10/17 (!) 149/80     Immunization History  Administered Date(s) Administered  . Influenza Split 07/19/2012  . Influenza Whole 06/24/2011, 05/13/2013  . Influenza, High Dose Seasonal PF 06/22/2017  . Influenza,inj,Quad PF,6+ Mos 06/03/2016  . Influenza-Unspecified 05/07/2014, 06/08/2015  . Pneumococcal Polysaccharide-23 07/18/2011  . Td 07/24/2005  . Tdap 08/01/2011  . Zoster 07/23/2012    Health Maintenance  Topic Date Due  . Hepatitis C Screening  31-Aug-1950  . PNA vac Low Risk Adult (1 of 2 - PCV13) 07/01/2015  . COLONOSCOPY  07/25/2015  . MAMMOGRAM  02/21/2017  . INFLUENZA VACCINE  02/21/2018  . TETANUS/TDAP  07/31/2021  . DEXA SCAN  Completed    Lab Results  Component Value Date   WBC 12.0 (H) 12/21/2017   HGB 13.9 12/21/2017   HCT 41.1 12/21/2017   PLT 375 12/21/2017   GLUCOSE 123 12/21/2017   CHOL 178 11/27/2016   TRIG 132.0 11/27/2016   HDL 67.30 11/27/2016   LDLCALC 84 11/27/2016   ALT 17 12/21/2017   AST 16 12/21/2017   NA 142 12/21/2017   K 3.6 12/21/2017   CL 102 12/21/2017   CREATININE 0.76 12/21/2017   BUN 19 12/21/2017   CO2 28 12/21/2017   TSH 2.02 11/27/2016   INR 0.99 08/19/2015    Lab Results  Component Value Date   TSH 2.02 11/27/2016   Lab Results  Component Value Date   WBC 12.0 (H) 12/21/2017   HGB 13.9 12/21/2017   HCT 41.1 12/21/2017   MCV 93.0 12/21/2017   PLT 375 12/21/2017   Lab Results  Component Value Date   NA 142 12/21/2017   K 3.6 12/21/2017   CHLORIDE 103 12/21/2016   CO2 28 12/21/2017   GLUCOSE 123 12/21/2017   BUN 19 12/21/2017   CREATININE 0.76 12/21/2017   BILITOT 0.5 12/21/2017   ALKPHOS 98 12/21/2017   AST 16 12/21/2017   ALT 17 12/21/2017   PROT 6.7 12/21/2017   ALBUMIN 3.4 (L) 12/21/2017   CALCIUM 9.4 12/21/2017   ANIONGAP 12 (H) 12/21/2017   EGFR 73 (L) 12/21/2016   GFR 74.04 11/27/2016    Lab Results  Component Value Date   CHOL 178 11/27/2016   Lab Results  Component Value Date   HDL 67.30 11/27/2016   Lab Results  Component Value Date   LDLCALC 84 11/27/2016   Lab Results  Component Value Date   TRIG 132.0 11/27/2016   Lab Results  Component Value Date   CHOLHDL 3 11/27/2016   No results found for: HGBA1C       Assessment & Plan:   Problem List Items Addressed This Visit    Morbid obesity ([place])    Is on liver shrinking diet brior to her gastric bypass surgery she is preparing for      Hypothyroidism    On Levothyroxine, continue to monitor      Hyperlipidemia    Encouraged heart healthy diet, increase exercise, avoid trans fats, consider a krill oil cap daily      Palpitations  No recent flares      Insomnia   Relevant Medications   zolpidem (AMBIEN) 10 MG tablet   Muscle cramps    Hydrate well, check magnesium level try hyland's leg cramp med.       Relevant Orders   Magnesium (Completed)    Other Visit Diagnoses    High risk medication use    -  Primary   Relevant Orders   Pain Mgmt, Profile 8 w/Conf, U (Completed)      I have changed Rebecca Brooks's zolpidem. I am also having her maintain her aspirin EC, nitroGLYCERIN, hyoscyamine, MELATONIN PO, albuterol, furosemide, potassium chloride SA, topiramate, ALPRAZolam, Vitamin D3, ANTI-INFLAMMATORY ENZYME, simvastatin, cetirizine, diclofenac sodium, fluticasone, hydrochlorothiazide, pantoprazole, famotidine, Vitamin B-12, levothyroxine, metoprolol tartrate, rOPINIRole, and traMADol.  Meds ordered this encounter  Medications  . zolpidem (AMBIEN) 10 MG tablet    Sig: Take 1 tablet (10 mg total) by mouth at bedtime as needed. for sleep    Dispense:  30 tablet    Refill:  1    CMA served as scribe during this visit. History, Physical and Plan performed by medical provider. Documentation and orders reviewed and attested to.  Penni Homans, MD

## 2018-01-01 NOTE — Patient Instructions (Addendum)
Hyland's leg cramp medicine  Insomnia Insomnia is a sleep disorder that makes it difficult to fall asleep or to stay asleep. Insomnia can cause tiredness (fatigue), low energy, difficulty concentrating, mood swings, and poor performance at work or school. There are three different ways to classify insomnia:  Difficulty falling asleep.  Difficulty staying asleep.  Waking up too early in the morning.  Any type of insomnia can be long-term (chronic) or short-term (acute). Both are common. Short-term insomnia usually lasts for three months or less. Chronic insomnia occurs at least three times a week for longer than three months. What are the causes? Insomnia may be caused by another condition, situation, or substance, such as:  Anxiety.  Certain medicines.  Gastroesophageal reflux disease (GERD) or other gastrointestinal conditions.  Asthma or other breathing conditions.  Restless legs syndrome, sleep apnea, or other sleep disorders.  Chronic pain.  Menopause. This may include hot flashes.  Stroke.  Abuse of alcohol, tobacco, or illegal drugs.  Depression.  Caffeine.  Neurological disorders, such as Alzheimer disease.  An overactive thyroid (hyperthyroidism).  The cause of insomnia may not be known. What increases the risk? Risk factors for insomnia include:  Gender. Women are more commonly affected than men.  Age. Insomnia is more common as you get older.  Stress. This may involve your professional or personal life.  Income. Insomnia is more common in people with lower income.  Lack of exercise.  Irregular work schedule or night shifts.  Traveling between different time zones.  What are the signs or symptoms? If you have insomnia, trouble falling asleep or trouble staying asleep is the main symptom. This may lead to other symptoms, such as:  Feeling fatigued.  Feeling nervous about going to sleep.  Not feeling rested in the morning.  Having trouble  concentrating.  Feeling irritable, anxious, or depressed.  How is this treated? Treatment for insomnia depends on the cause. If your insomnia is caused by an underlying condition, treatment will focus on addressing the condition. Treatment may also include:  Medicines to help you sleep.  Counseling or therapy.  Lifestyle adjustments.  Follow these instructions at home:  Take medicines only as directed by your health care provider.  Keep regular sleeping and waking hours. Avoid naps.  Keep a sleep diary to help you and your health care provider figure out what could be causing your insomnia. Include: ? When you sleep. ? When you wake up during the night. ? How well you sleep. ? How rested you feel the next day. ? Any side effects of medicines you are taking. ? What you eat and drink.  Make your bedroom a comfortable place where it is easy to fall asleep: ? Put up shades or special blackout curtains to block light from outside. ? Use a white noise machine to block noise. ? Keep the temperature cool.  Exercise regularly as directed by your health care provider. Avoid exercising right before bedtime.  Use relaxation techniques to manage stress. Ask your health care provider to suggest some techniques that may work well for you. These may include: ? Breathing exercises. ? Routines to release muscle tension. ? Visualizing peaceful scenes.  Cut back on alcohol, caffeinated beverages, and cigarettes, especially close to bedtime. These can disrupt your sleep.  Do not overeat or eat spicy foods right before bedtime. This can lead to digestive discomfort that can make it hard for you to sleep.  Limit screen use before bedtime. This includes: ? Watching TV. ?  Using your smartphone, tablet, and computer.  Stick to a routine. This can help you fall asleep faster. Try to do a quiet activity, brush your teeth, and go to bed at the same time each night.  Get out of bed if you are  still awake after 15 minutes of trying to sleep. Keep the lights down, but try reading or doing a quiet activity. When you feel sleepy, go back to bed.  Make sure that you drive carefully. Avoid driving if you feel very sleepy.  Keep all follow-up appointments as directed by your health care provider. This is important. Contact a health care provider if:  You are tired throughout the day or have trouble in your daily routine due to sleepiness.  You continue to have sleep problems or your sleep problems get worse. Get help right away if:  You have serious thoughts about hurting yourself or someone else. This information is not intended to replace advice given to you by your health care provider. Make sure you discuss any questions you have with your health care provider. Document Released: 07/07/2000 Document Revised: 12/10/2015 Document Reviewed: 04/10/2014 Elsevier Interactive Patient Education  Rebecca Brooks.

## 2018-01-02 DIAGNOSIS — R252 Cramp and spasm: Secondary | ICD-10-CM | POA: Insufficient documentation

## 2018-01-02 LAB — PAIN MGMT, PROFILE 8 W/CONF, U
6 Acetylmorphine: NEGATIVE ng/mL (ref <10)
ALCOHOL METABOLITES: NEGATIVE ng/mL (ref <500)
Amphetamines: NEGATIVE ng/mL (ref <500)
BENZODIAZEPINES: NEGATIVE ng/mL (ref <100)
Buprenorphine, Urine: NEGATIVE ng/mL (ref <5)
COCAINE METABOLITE: NEGATIVE ng/mL (ref <150)
CREATININE: 114.8 mg/dL
MDMA: NEGATIVE ng/mL (ref <500)
Marijuana Metabolite: NEGATIVE ng/mL (ref <20)
OXIDANT: NEGATIVE ug/mL (ref <200)
Opiates: NEGATIVE ng/mL (ref <100)
Oxycodone: NEGATIVE ng/mL (ref <100)
PH: 7.02 (ref 4.5–9.0)

## 2018-01-02 NOTE — Assessment & Plan Note (Signed)
No recent flares 

## 2018-01-02 NOTE — Assessment & Plan Note (Signed)
Hydrate well, check magnesium level try hyland's leg cramp med.

## 2018-01-02 NOTE — Assessment & Plan Note (Signed)
Encouraged heart healthy diet, increase exercise, avoid trans fats, consider a krill oil cap daily 

## 2018-01-02 NOTE — Assessment & Plan Note (Signed)
On Levothyroxine, continue to monitor 

## 2018-01-02 NOTE — Assessment & Plan Note (Signed)
Is on liver shrinking diet brior to her gastric bypass surgery she is preparing for

## 2018-01-03 ENCOUNTER — Other Ambulatory Visit: Payer: Self-pay | Admitting: Family Medicine

## 2018-01-04 ENCOUNTER — Encounter: Payer: Self-pay | Admitting: Adult Health

## 2018-01-04 ENCOUNTER — Encounter: Payer: Self-pay | Admitting: Family Medicine

## 2018-01-04 ENCOUNTER — Ambulatory Visit: Payer: Medicare Other | Admitting: Adult Health

## 2018-01-04 ENCOUNTER — Telehealth: Payer: Self-pay | Admitting: Internal Medicine

## 2018-01-04 VITALS — BP 154/86 | Temp 97.6°F | Wt 295.0 lb

## 2018-01-04 DIAGNOSIS — B029 Zoster without complications: Secondary | ICD-10-CM | POA: Diagnosis not present

## 2018-01-04 MED ORDER — VALACYCLOVIR HCL 1 G PO TABS: 1000.0000 mg | ORAL_TABLET | Freq: Three times a day (TID) | ORAL | 0 refills | Status: DC

## 2018-01-04 NOTE — Telephone Encounter (Signed)
Pt seeing Cory at [facility] at [facility].

## 2018-01-04 NOTE — Progress Notes (Signed)
Subjective:    [name], female    DOB: [birth year], 67 y.o.   MRN: [id number]  HPI  67 year old female who  has a past medical history of Abnormal nuclear stress test (08/23/2015), Arthritis, Chicken pox (as a child), Essential hypertension (07/17/2011), GERD (gastroesophageal reflux disease), Hyperlipidemia, Hypertension, Hypothyroidism, Insomnia (11/01/2014), Low back pain radiating to right leg, Measles (as a child), Mumps (as a child), Obesity, Pneumonia, Recurrent epistaxis (02/20/2012), Rheumatoid arthritis ([place]) (09/10/2011), and RLS (restless legs syndrome) (11/01/2014).  She presents to the office today for an acute issue of concern for shingles. She reports first noticing a sharp pain under her right breast about 3 nights ago. Since that time the pain has continued and she has broken out in a rash under her right breast. Wearing a bra is painful. She denies any drainage   Review of Systems See HPI   Past Medical History:  Diagnosis Date  . Abnormal nuclear stress test 08/23/2015  . Arthritis   . Chicken pox as a child  . Essential hypertension 07/17/2011   ACEI d/c 06/2011 for refractory cough> resolved   . GERD (gastroesophageal reflux disease)   . Hyperlipidemia   . Hypertension   . Hypothyroidism   . Insomnia 11/01/2014  . Low back pain radiating to right leg    intermittent and occasional numbness of skin over right hip in certain positions  . Measles as a child  . Mumps as a child  . Obesity   . Pneumonia    this October from which she has said inhailer  . Recurrent epistaxis 02/20/2012  . Rheumatoid arthritis ([place]) 09/10/2011   Bone on bone in knees Pain in L>R knees.   . RLS (restless legs syndrome) 11/01/2014    Social History   Socioeconomic History  . Marital status: Married    Spouse name: Not on file  . Number of children: 3  . Years of education: Not on file  . Highest education level: Not on file  Occupational History  . Occupation:  Training and development officer  . Occupation: Primary school teacher  Social Needs  . Financial resource strain: Not on file  . Food insecurity:    Worry: Not on file    Inability: Not on file  . Transportation needs:    Medical: Not on file    Non-medical: Not on file  Tobacco Use  . Smoking status: Former Smoker    Packs/day: 0.50    Years: 5.00    Pack years: 2.50    Types: Cigarettes    Last attempt to quit: 07/25/1971    Years since quitting: 46.4  . Smokeless tobacco: Never Used  Substance and Sexual Activity  . Alcohol use: Yes    Comment: socially  . Drug use: No  . 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 Physical activity:    Days per week: Not on file    Minutes per session: Not on file  . Stress: Not on file  Relationships  . Social connections:    Talks on phone: Not on file    Gets together: Not on file    Attends religious service: Not on file    Active member of club or organization: Not on file    Attends meetings of clubs or organizations: Not on file    Relationship status: Not on file  . Intimate partner violence:    Fear of current or ex  partner: Not on file    Emotionally abused: Not on file    Physically abused: Not on file    Forced sexual activity: Not on file  Other Topics Concern  . Not on file  Social History Narrative  . Not on file    Past Surgical History:  Procedure Laterality Date  . CARDIAC CATHETERIZATION N/A 08/23/2015   Procedure: Left Heart Cath and Coronary Angiography;  Surgeon: [name], MD;  Location: [facility];  Service: Cardiovascular;  Laterality: N/A;  . CESAREAN SECTION     X 3  . INGUINAL HERNIA REPAIR Right 67 yrs old  . THYROIDECTOMY     total for benign tumor, Parathyroid spared  . TONSILLECTOMY    . TOTAL HIP ARTHROPLASTY Right 2006   secondary to congenital  hip defect    Family History  Problem Relation Age of Onset  . Lung cancer Mother 17       smoker  . Heart  Problems Mother        tachycardia  . Hearing loss Mother   . Anxiety disorder Mother        anxiety, claustrophobia  . Osteoarthritis Father   . Hyperlipidemia Father   . Anuerysm Father        AAA  . Hyperlipidemia Sister   . Migraines Sister   . Hypertension Sister   . Colon cancer Maternal Grandmother   . Heart attack Maternal Grandfather   . Aneurysm Paternal Grandfather        abdominal  . Heart disease Paternal Grandfather        AAA rupture, smoker  . Heart Problems Sister        tachycardia    Allergies  Allergen Reactions  . Bee Venom Anaphylaxis  . Accupril [Quinapril Hcl] Cough  . Quinapril-Hydrochlorothiazide Cough    Current Outpatient Medications on File Prior to Visit  Medication Sig Dispense Refill  . albuterol (PROAIR HFA) 108 (90 Base) MCG/ACT inhaler Inhale 2 puffs into the lungs every 6 (six) hours as needed for wheezing or shortness of breath. 8.5 Inhaler 6  . ALPRAZolam (XANAX) 0.25 MG tablet Take 1 tablet (0.25 mg total) by mouth daily as needed for anxiety. 5 tablet 0  . aspirin EC 81 MG tablet Take 1 tablet (81 mg total) by mouth daily.    . cetirizine (ZYRTEC) 10 MG tablet Take 1 tablet (10 mg total) by mouth daily. 90 tablet 0  . cetirizine (ZYRTEC) 10 MG tablet TAKE 1 TABLET(10 MG) BY MOUTH DAILY 30 tablet 0  . Cholecalciferol (VITAMIN D3) 1000 units CAPS Take 1 capsule (1,000 Units total) daily by mouth. (Patient taking differently: Take 2 capsules by mouth daily. ) 90 capsule 0  . Cyanocobalamin (VITAMIN B-12) 1000 MCG SUBL Place under the tongue.    . diclofenac sodium (VOLTAREN) 1 % GEL APPLY 4 GRAMS TO 3 LARGE JOINTS THREE TIMES DAILY AS NEEDED 500 g 0  . famotidine (PEPCID) 20 MG tablet Take 1 tablet (20 mg total) by mouth at bedtime. 90 tablet 0  . famotidine (PEPCID) 20 MG tablet TAKE 1 TABLET(20 MG) BY MOUTH AT BEDTIME 90 tablet 0  . fluticasone (FLONASE) 50 MCG/ACT nasal spray SHAKE LIQUID AND USE 2 SPRAYS IN EACH NOSTRIL DAILY 48 g 1    . furosemide (LASIX) 20 MG tablet TAKE 2 TABLETS BY MOUTH DAILY FOR 5 DAYS THEN DECREASE TO 1 TABLET BY MOUTH DAILY AS NEEDED 180 tablet 3  . hydrochlorothiazide (HYDRODIURIL) 25  MG tablet Take 1 tablet (25 mg total) by mouth daily. 90 tablet 1  . hyoscyamine (LEVSIN SL) 0.125 MG SL tablet Place 1 tablet (0.125 mg total) under the tongue every 4 (four) hours as needed. 30 tablet 0  . levothyroxine (SYNTHROID, LEVOTHROID) 100 MCG tablet TAKE 1 TABLET BY MOUTH DAILY BEFORE BREAKFAST 90 tablet 0  . MELATONIN PO Take 1 tablet by mouth at bedtime as needed (sleep).    . metoprolol tartrate (LOPRESSOR) 50 MG tablet TAKE 1 TABLET BY MOUTH TWICE DAILY 180 tablet 0  . nitroGLYCERIN (NITROSTAT) 0.4 MG SL tablet Place 1 tablet (0.4 mg total) under the tongue every 5 (five) minutes as needed for chest pain. 25 tablet 3  . Nutritional Supplements (ANTI-INFLAMMATORY ENZYME) CAPS APPLY 1 2 GMS TO AFFECTED AREA 3 4 TIMES DAILY AS NEEDED FOR PAIN  THIS IS A COMPOUNDED CREAM 1 PUMP EQUALS 1 GRAM  1  . pantoprazole (PROTONIX) 40 MG tablet Take 1 tablet (40 mg total) by mouth daily. 90 tablet 1  . potassium chloride SA (K-DUR,KLOR-CON) 20 MEQ tablet TAKE 1 TABLET BY MOUTH DAILY AS NEEDED(DEPENDING ON LASIX USE) 90 tablet 3  . rOPINIRole (REQUIP) 0.5 MG tablet TAKE 1 TO 2 TABLETS BY MOUTH EVERY NIGHT AT BEDTIME AS NEEDED 180 tablet 0  . simvastatin (ZOCOR) 40 MG tablet TAKE 1 TABLET BY MOUTH EVERY EVENING 90 tablet 0  . topiramate (TOPAMAX) 25 MG tablet   0  . traMADol (ULTRAM) 50 MG tablet TAKE 1 TO 2 TABLETS BY MOUTH EVERY 8 HOURS AS NEEDED 90 tablet 0  . zolpidem (AMBIEN) 10 MG tablet Take 1 tablet (10 mg total) by mouth at bedtime as needed. for sleep 30 tablet 1  . [DISCONTINUED] budesonide-formoterol (SYMBICORT) 160-4.5 MCG/ACT inhaler Inhale 2 puffs into the lungs 2 (two) times daily as needed. Patient has sample, is not using 1 Inhaler 0   No current facility-administered medications on file prior to visit.      BP (!) 154/86   Temp 97.6 F (36.4 C) (Oral)   Wt 295 lb (133.8 kg)   BMI 53.52 kg/m       Objective:   Physical Exam  Constitutional: She is oriented to person, place, and time. She appears well-developed and well-nourished. No distress.  Cardiovascular: Normal rate, regular rhythm, normal heart sounds and intact distal pulses.  Pulmonary/Chest: Effort normal and breath sounds normal.  Neurological: She is alert and oriented to person, place, and time.  Skin: Skin is warm and dry. Rash noted. Rash is vesicular. She is not diaphoretic.  Red vesicular rash under right breast. Does not cross midline. No drainage noted. Multiple vesicles have scabbed over   Psychiatric: She has a normal mood and affect. Her behavior is normal. Judgment and thought content normal.  Nursing note and vitals reviewed.     Assessment & Plan:  1. Herpes zoster without complication - Rash consistent with shingles rash along T5 dermatome.  - valACYclovir (VALTREX) 1000 MG tablet; Take 1 tablet (1,000 mg total) by mouth 3 (three) times daily for 10 days.  Dispense: 30 tablet; Refill: 0 - She already has prescribed Tramadol.  - Follow up if no improvement in the next 2-3 days   Dorothyann Peng, NP

## 2018-01-04 NOTE — Telephone Encounter (Signed)
Advised patient, I saw her message and  the picture, it is possibly shingles, is better if she comes to the office this afternoon to be seen. If it is impossible for her to come, then I will have to prescribe Valtrex empirically.

## 2018-01-04 NOTE — Telephone Encounter (Signed)
Please call Pt- needs to be seen for shingles. Dr. Larose Kells has several openings this PM.

## 2018-01-05 ENCOUNTER — Other Ambulatory Visit: Payer: Self-pay | Admitting: Family Medicine

## 2018-01-05 DIAGNOSIS — R601 Generalized edema: Secondary | ICD-10-CM

## 2018-01-09 ENCOUNTER — Other Ambulatory Visit: Payer: Self-pay | Admitting: Family Medicine

## 2018-01-09 DIAGNOSIS — Z01810 Encounter for preprocedural cardiovascular examination: Secondary | ICD-10-CM | POA: Diagnosis not present

## 2018-01-09 DIAGNOSIS — R0683 Snoring: Secondary | ICD-10-CM | POA: Diagnosis not present

## 2018-01-09 DIAGNOSIS — G478 Other sleep disorders: Secondary | ICD-10-CM | POA: Diagnosis not present

## 2018-01-09 DIAGNOSIS — Z6841 Body Mass Index (BMI) 40.0 and over, adult: Secondary | ICD-10-CM | POA: Diagnosis not present

## 2018-01-09 DIAGNOSIS — Z01818 Encounter for other preprocedural examination: Secondary | ICD-10-CM | POA: Diagnosis not present

## 2018-01-09 DIAGNOSIS — R0989 Other specified symptoms and signs involving the circulatory and respiratory systems: Secondary | ICD-10-CM | POA: Diagnosis not present

## 2018-01-09 DIAGNOSIS — I10 Essential (primary) hypertension: Secondary | ICD-10-CM | POA: Diagnosis not present

## 2018-01-10 DIAGNOSIS — Z6841 Body Mass Index (BMI) 40.0 and over, adult: Secondary | ICD-10-CM | POA: Diagnosis not present

## 2018-01-10 DIAGNOSIS — Z01810 Encounter for preprocedural cardiovascular examination: Secondary | ICD-10-CM | POA: Diagnosis not present

## 2018-01-10 DIAGNOSIS — I10 Essential (primary) hypertension: Secondary | ICD-10-CM | POA: Diagnosis not present

## 2018-01-10 DIAGNOSIS — M170 Bilateral primary osteoarthritis of knee: Secondary | ICD-10-CM | POA: Diagnosis not present

## 2018-01-15 DIAGNOSIS — E890 Postprocedural hypothyroidism: Secondary | ICD-10-CM | POA: Diagnosis not present

## 2018-01-15 DIAGNOSIS — R110 Nausea: Secondary | ICD-10-CM | POA: Diagnosis not present

## 2018-01-15 DIAGNOSIS — R9431 Abnormal electrocardiogram [ECG] [EKG]: Secondary | ICD-10-CM | POA: Diagnosis not present

## 2018-01-15 DIAGNOSIS — M19011 Primary osteoarthritis, right shoulder: Secondary | ICD-10-CM | POA: Diagnosis not present

## 2018-01-15 DIAGNOSIS — I10 Essential (primary) hypertension: Secondary | ICD-10-CM | POA: Diagnosis not present

## 2018-01-15 DIAGNOSIS — J9811 Atelectasis: Secondary | ICD-10-CM | POA: Diagnosis not present

## 2018-01-15 DIAGNOSIS — M170 Bilateral primary osteoarthritis of knee: Secondary | ICD-10-CM | POA: Diagnosis not present

## 2018-01-15 DIAGNOSIS — Z79899 Other long term (current) drug therapy: Secondary | ICD-10-CM | POA: Diagnosis not present

## 2018-01-15 DIAGNOSIS — G4733 Obstructive sleep apnea (adult) (pediatric): Secondary | ICD-10-CM | POA: Diagnosis not present

## 2018-01-15 DIAGNOSIS — K219 Gastro-esophageal reflux disease without esophagitis: Secondary | ICD-10-CM | POA: Diagnosis not present

## 2018-01-15 DIAGNOSIS — Z6841 Body Mass Index (BMI) 40.0 and over, adult: Secondary | ICD-10-CM | POA: Diagnosis not present

## 2018-01-15 DIAGNOSIS — E8881 Metabolic syndrome: Secondary | ICD-10-CM | POA: Diagnosis not present

## 2018-01-15 DIAGNOSIS — Z7982 Long term (current) use of aspirin: Secondary | ICD-10-CM | POA: Diagnosis not present

## 2018-01-15 DIAGNOSIS — Z8249 Family history of ischemic heart disease and other diseases of the circulatory system: Secondary | ICD-10-CM | POA: Diagnosis not present

## 2018-01-15 DIAGNOSIS — E785 Hyperlipidemia, unspecified: Secondary | ICD-10-CM | POA: Diagnosis not present

## 2018-01-15 DIAGNOSIS — Z87891 Personal history of nicotine dependence: Secondary | ICD-10-CM | POA: Diagnosis not present

## 2018-01-17 ENCOUNTER — Encounter: Payer: Self-pay | Admitting: Family Medicine

## 2018-01-19 ENCOUNTER — Other Ambulatory Visit: Payer: Self-pay | Admitting: Family Medicine

## 2018-01-19 DIAGNOSIS — B029 Zoster without complications: Secondary | ICD-10-CM

## 2018-01-19 MED ORDER — VALACYCLOVIR HCL 1 G PO TABS: 1000.0000 mg | ORAL_TABLET | Freq: Three times a day (TID) | ORAL | 0 refills | Status: AC

## 2018-02-01 ENCOUNTER — Other Ambulatory Visit: Payer: Self-pay

## 2018-02-01 ENCOUNTER — Inpatient Hospital Stay: Payer: Medicare Other | Attending: Family | Admitting: Family

## 2018-02-01 ENCOUNTER — Inpatient Hospital Stay: Payer: Medicare Other

## 2018-02-01 VITALS — BP 146/76 | HR 74 | Temp 98.0°F | Resp 18 | Wt 281.0 lb

## 2018-02-01 DIAGNOSIS — M1990 Unspecified osteoarthritis, unspecified site: Secondary | ICD-10-CM | POA: Diagnosis not present

## 2018-02-01 DIAGNOSIS — D509 Iron deficiency anemia, unspecified: Secondary | ICD-10-CM | POA: Diagnosis not present

## 2018-02-01 DIAGNOSIS — D508 Other iron deficiency anemias: Secondary | ICD-10-CM

## 2018-02-01 DIAGNOSIS — Z79899 Other long term (current) drug therapy: Secondary | ICD-10-CM | POA: Insufficient documentation

## 2018-02-01 DIAGNOSIS — D72829 Elevated white blood cell count, unspecified: Secondary | ICD-10-CM | POA: Diagnosis not present

## 2018-02-01 LAB — CBC WITH DIFFERENTIAL (CANCER CENTER ONLY)
BASOS ABS: 0.1 10*3/uL (ref 0.0–0.1)
Basophils Relative: 1 %
Eosinophils Absolute: 0.4 10*3/uL (ref 0.0–0.5)
Eosinophils Relative: 4 %
HEMATOCRIT: 43.6 % (ref 34.8–46.6)
Hemoglobin: 14.2 g/dL (ref 11.6–15.9)
LYMPHS ABS: 2 10*3/uL (ref 0.9–3.3)
LYMPHS PCT: 19 %
MCH: 30.7 pg (ref 26.0–34.0)
MCHC: 32.6 g/dL (ref 32.0–36.0)
MCV: 94.4 fL (ref 81.0–101.0)
MONO ABS: 0.9 10*3/uL (ref 0.1–0.9)
MONOS PCT: 8 %
NEUTROS ABS: 7.1 10*3/uL — AB (ref 1.5–6.5)
Neutrophils Relative %: 68 %
Platelet Count: 399 10*3/uL (ref 145–400)
RBC: 4.62 MIL/uL (ref 3.70–5.32)
RDW: 13.3 % (ref 11.1–15.7)
WBC Count: 10.4 10*3/uL — ABNORMAL HIGH (ref 3.9–10.0)

## 2018-02-01 LAB — RETICULOCYTES
RBC.: 4.6 MIL/uL (ref 3.87–5.11)
RETIC COUNT ABSOLUTE: 55.2 10*3/uL (ref 19.0–186.0)
Retic Ct Pct: 1.2 % (ref 0.4–3.1)

## 2018-02-01 NOTE — Progress Notes (Signed)
Hematology and Oncology Follow Up Visit  Rebecca Brooks [id number] 24-Jul-1951 67 y.o. 02/01/2018   Principle Diagnosis:  Reactive leukocytosis Iron deficiency anemia  Thrombocytosis  Current Therapy:   IV iron as indicated - last received 2 doses in December 2018   Interim History:  Rebecca Brooks is a very pleasant 67 yo caucasian is here today for follow-up. She is doing well and has recuperated nicely from her gastrectomy on 6/25.  She is on soft foods now and tolerating them well. She is also taking bariatric vitamins daily. She has noticed several episodes of chills.  She had shingles under the right breast in early June successfully treated with an anti viral.  No fever, n/v, cough, rash, dizziness, SOB, chest pain, palpitations, abdominal pain or changes in bowel or bladder habits.  No episodes of bleeding, no bruising or petechiae. No c/o pain.  No swelling, tenderness, numbness or tingling in her extremities.  No lymphadenopathy noted on exam.   ECOG Performance Status: 1 - Symptomatic but completely ambulatory  Medications:  Allergies as of 02/01/2018      Reactions   Bee Venom Anaphylaxis   Accupril [quinapril Hcl] Cough   Quinapril-hydrochlorothiazide Cough      Medication List        Accurate as of 02/01/18  3:49 PM. Always use your most recent med list.          albuterol 108 (90 Base) MCG/ACT inhaler Commonly known as:  PROAIR HFA Inhale 2 puffs into the lungs every 6 (six) hours as needed for wheezing or shortness of breath.   ALPRAZolam 0.25 MG tablet Commonly known as:  XANAX Take 1 tablet (0.25 mg total) by mouth daily as needed for anxiety.   ANTI-INFLAMMATORY ENZYME Caps APPLY 1 2 GMS TO AFFECTED AREA 3 4 TIMES DAILY AS NEEDED FOR PAIN  THIS IS A COMPOUNDED CREAM 1 PUMP EQUALS 1 GRAM   aspirin EC 81 MG tablet Take 1 tablet (81 mg total) by mouth daily.   cetirizine 10 MG tablet Commonly known as:  ZYRTEC Take 1 tablet (10 mg total) by mouth  daily.   cetirizine 10 MG tablet Commonly known as:  ZYRTEC TAKE 1 TABLET(10 MG) BY MOUTH DAILY   diclofenac sodium 1 % Gel Commonly known as:  VOLTAREN APPLY 4 GRAMS TO 3 LARGE JOINTS THREE TIMES DAILY AS NEEDED   famotidine 20 MG tablet Commonly known as:  PEPCID Take 1 tablet (20 mg total) by mouth at bedtime.   famotidine 20 MG tablet Commonly known as:  PEPCID TAKE 1 TABLET(20 MG) BY MOUTH AT BEDTIME   fluticasone 50 MCG/ACT nasal spray Commonly known as:  FLONASE SHAKE LIQUID AND USE 2 SPRAYS IN EACH NOSTRIL DAILY   furosemide 20 MG tablet Commonly known as:  LASIX TAKE 2 TABLETS BY MOUTH DAILY FOR 5 DAYS THEN DECREASE TO 1 TABLET BY MOUTH DAILY AS NEEDED   furosemide 20 MG tablet Commonly known as:  LASIX TAKE 2 TABLETS BY MOUTH DAILY FOR 5 DAYS THEN DECREASE TO 1 TABLET BY MOUTH DAILY AS NEEDED   hydrochlorothiazide 25 MG tablet Commonly known as:  HYDRODIURIL Take 1 tablet (25 mg total) by mouth daily.   hyoscyamine 0.125 MG SL tablet Commonly known as:  LEVSIN SL Place 1 tablet (0.125 mg total) under the tongue every 4 (four) hours as needed.   levothyroxine 100 MCG tablet Commonly known as:  SYNTHROID, LEVOTHROID TAKE 1 TABLET BY MOUTH DAILY BEFORE BREAKFAST   MELATONIN PO  Take 1 tablet by mouth at bedtime as needed (sleep).   metoprolol tartrate 50 MG tablet Commonly known as:  LOPRESSOR TAKE 1 TABLET BY MOUTH TWICE DAILY   nitroGLYCERIN 0.4 MG SL tablet Commonly known as:  NITROSTAT Place 1 tablet (0.4 mg total) under the tongue every 5 (five) minutes as needed for chest pain.   pantoprazole 40 MG tablet Commonly known as:  PROTONIX Take 1 tablet (40 mg total) by mouth daily.   potassium chloride SA 20 MEQ tablet Commonly known as:  K-DUR,KLOR-CON TAKE 1 TABLET BY MOUTH DAILY AS NEEDED(DEPENDING ON LASIX USE)   rOPINIRole 0.5 MG tablet Commonly known as:  REQUIP TAKE 1 TO 2 TABLETS BY MOUTH EVERY NIGHT AT BEDTIME AS NEEDED   simvastatin 40  MG tablet Commonly known as:  ZOCOR TAKE 1 TABLET BY MOUTH EVERY EVENING   topiramate 25 MG tablet Commonly known as:  TOPAMAX   traMADol 50 MG tablet Commonly known as:  ULTRAM TAKE 1 TO 2 TABLETS BY MOUTH EVERY 8 HOURS AS NEEDED   Vitamin B-12 1000 MCG Subl Place under the tongue.   Vitamin D3 1000 units Caps Take 1 capsule (1,000 Units total) daily by mouth.   zolpidem 10 MG tablet Commonly known as:  AMBIEN Take 1 tablet (10 mg total) by mouth at bedtime as needed. for sleep       Allergies:  Allergies  Allergen Reactions  . Bee Venom Anaphylaxis  . Accupril [Quinapril Hcl] Cough  . Quinapril-Hydrochlorothiazide Cough    Past Medical History, Surgical history, Social history, and Family History were reviewed and updated.  Review of Systems: All other 10 point review of systems is negative.   Physical Exam:  vitals were not taken for this visit.   Wt Readings from Last 3 Encounters:  01/04/18 295 lb (133.8 kg)  01/01/18 295 lb 3.2 oz (133.9 kg)  12/21/17 300 lb (136.1 kg)    Ocular: Sclerae unicteric, pupils equal, round and reactive to light Ear-nose-throat: Oropharynx clear, dentition fair Lymphatic: No cervical, supraclavicular or axillary adenopathy Lungs no rales or rhonchi, good excursion bilaterally Heart regular rate and rhythm, no murmur appreciated Abd soft, nontender, positive bowel sounds, no liver or spleen tip palpated on exam, no fluid wave  MSK no focal spinal tenderness, no joint edema Neuro: non-focal, well-oriented, appropriate affect Breasts: Deferred   Lab Results  Component Value Date   WBC 10.4 (H) 02/01/2018   HGB 14.2 02/01/2018   HCT 43.6 02/01/2018   MCV 94.4 02/01/2018   PLT 399 02/01/2018   Lab Results  Component Value Date   FERRITIN 218 12/21/2017   IRON 90 12/21/2017   TIBC 289 12/21/2017   UIBC 199 12/21/2017   IRONPCTSAT 31 12/21/2017   Lab Results  Component Value Date   RBC 4.62 02/01/2018   No results  found for: KPAFRELGTCHN, LAMBDASER, KAPLAMBRATIO No results found for: IGGSERUM, IGA, IGMSERUM No results found for: Odetta Pink, SPEI   Chemistry      Component Value Date/Time   NA 142 12/21/2017 1327   NA 143 06/22/2017 1311   NA 142 12/21/2016 0922   K 3.6 12/21/2017 1327   K 3.1 (L) 06/22/2017 1311   K 3.5 12/21/2016 0922   CL 102 12/21/2017 1327   CL 99 06/22/2017 1311   CO2 28 12/21/2017 1327   CO2 32 06/22/2017 1311   CO2 30 (H) 12/21/2016 0922   BUN 19 12/21/2017 1327  BUN 21 06/22/2017 1311   BUN 22.4 12/21/2016 0922   CREATININE 0.76 12/21/2017 1327   CREATININE 0.9 06/22/2017 1311   CREATININE 0.8 12/21/2016 0922      Component Value Date/Time   CALCIUM 9.4 12/21/2017 1327   CALCIUM 8.9 06/22/2017 1311   CALCIUM 9.2 12/21/2016 0922   ALKPHOS 98 12/21/2017 1327   ALKPHOS 99 (H) 06/22/2017 1311   ALKPHOS 114 12/21/2016 0922   AST 16 12/21/2017 1327   AST 15 12/21/2016 0922   ALT 17 12/21/2017 1327   ALT 19 06/22/2017 1311   ALT 13 12/21/2016 0922   BILITOT 0.5 12/21/2017 1327   BILITOT 0.29 12/21/2016 0922      Impression and Plan: Ms. Boyington is a very pleasant 67 yo caucasian female with mild reactive leukocytosis with arthritis and iron deficiency anemia. She is doing well and has no complaints at this time.  We will see what her iron studies show and bring her back in for infusion if needed.  We will plan to see her back in another 8 weeks for follow-up.  She will contact our office with any questions or concerns. We can certainly see her sooner if need be.   Laverna Peace, NP 7/12/20193:49 PM

## 2018-02-02 ENCOUNTER — Other Ambulatory Visit: Payer: Self-pay | Admitting: Family Medicine

## 2018-02-04 LAB — FERRITIN: Ferritin: 277 ng/mL (ref 11–307)

## 2018-02-04 LAB — IRON AND TIBC
Iron: 43 ug/dL (ref 41–142)
Saturation Ratios: 17 % — ABNORMAL LOW (ref 21–57)
TIBC: 251 ug/dL (ref 236–444)
UIBC: 208 ug/dL

## 2018-02-05 ENCOUNTER — Telehealth: Payer: Self-pay | Admitting: Rheumatology

## 2018-02-05 NOTE — Telephone Encounter (Signed)
Patient called stating she would like to schedule Hyalgan injections for her knees.  Patient states she just had bariatric surgery and is hoping to get the weight off so she can schedule knee replacement surgery soon.

## 2018-02-05 NOTE — Telephone Encounter (Signed)
Okay to apply for Hyalgan?

## 2018-02-06 NOTE — Telephone Encounter (Signed)
Please apply for Hyalgan for bilateral knees. Thanks!

## 2018-02-06 NOTE — Telephone Encounter (Signed)
Ok to apply for Hyalgan for bilateral knee joints.

## 2018-02-07 ENCOUNTER — Encounter: Payer: Self-pay | Admitting: Family

## 2018-02-07 DIAGNOSIS — Z713 Dietary counseling and surveillance: Secondary | ICD-10-CM | POA: Diagnosis not present

## 2018-02-07 DIAGNOSIS — Z903 Acquired absence of stomach [part of]: Secondary | ICD-10-CM | POA: Diagnosis not present

## 2018-02-07 DIAGNOSIS — Z6841 Body Mass Index (BMI) 40.0 and over, adult: Secondary | ICD-10-CM | POA: Diagnosis not present

## 2018-02-07 NOTE — Telephone Encounter (Signed)
Noted  

## 2018-02-11 ENCOUNTER — Inpatient Hospital Stay: Payer: Medicare Other

## 2018-02-11 DIAGNOSIS — D508 Other iron deficiency anemias: Secondary | ICD-10-CM

## 2018-02-11 DIAGNOSIS — D72829 Elevated white blood cell count, unspecified: Secondary | ICD-10-CM | POA: Diagnosis not present

## 2018-02-11 MED ORDER — FERUMOXYTOL INJECTION 510 MG/17 ML
510.0000 mg | Freq: Once | INTRAVENOUS | Status: AC
  Administered 2018-02-11: 510 mg via INTRAVENOUS
  Filled 2018-02-11: qty 17

## 2018-02-11 NOTE — Patient Instructions (Signed)

## 2018-02-13 ENCOUNTER — Telehealth (INDEPENDENT_AMBULATORY_CARE_PROVIDER_SITE_OTHER): Payer: Self-pay

## 2018-02-13 NOTE — Telephone Encounter (Signed)
Faxed Hyalgan application to [phone] for Hyalgan, bilateral knee. 

## 2018-02-18 ENCOUNTER — Other Ambulatory Visit (HOSPITAL_BASED_OUTPATIENT_CLINIC_OR_DEPARTMENT_OTHER): Payer: Self-pay

## 2018-02-18 DIAGNOSIS — G4730 Sleep apnea, unspecified: Secondary | ICD-10-CM

## 2018-02-18 DIAGNOSIS — R0683 Snoring: Secondary | ICD-10-CM

## 2018-02-21 ENCOUNTER — Ambulatory Visit (HOSPITAL_BASED_OUTPATIENT_CLINIC_OR_DEPARTMENT_OTHER): Payer: Medicare Other | Attending: Physician Assistant | Admitting: Internal Medicine

## 2018-02-21 VITALS — Ht 62.0 in | Wt 271.0 lb

## 2018-02-21 DIAGNOSIS — G4733 Obstructive sleep apnea (adult) (pediatric): Secondary | ICD-10-CM | POA: Insufficient documentation

## 2018-02-21 DIAGNOSIS — R0683 Snoring: Secondary | ICD-10-CM

## 2018-02-21 DIAGNOSIS — R0902 Hypoxemia: Secondary | ICD-10-CM | POA: Insufficient documentation

## 2018-02-21 DIAGNOSIS — G4730 Sleep apnea, unspecified: Secondary | ICD-10-CM

## 2018-02-21 DIAGNOSIS — G4736 Sleep related hypoventilation in conditions classified elsewhere: Secondary | ICD-10-CM | POA: Diagnosis not present

## 2018-02-22 ENCOUNTER — Telehealth (INDEPENDENT_AMBULATORY_CARE_PROVIDER_SITE_OTHER): Payer: Self-pay

## 2018-02-22 NOTE — Telephone Encounter (Signed)
PA required for Hyalgan, bilateral knee.   Faxed PA form to [facility] at [phone] and faxed PA form to [phone] to [facility].

## 2018-02-25 ENCOUNTER — Telehealth (INDEPENDENT_AMBULATORY_CARE_PROVIDER_SITE_OTHER): Payer: Self-pay

## 2018-02-25 NOTE — Telephone Encounter (Signed)
Called and spoke with [facility] and advised them to cancel Rx for Hyalgan to go to pharmacy, due to provider being able to [facility] for product.  Called and left a VM for Judson Roch with [employer] to call back concerning patient's [employer].

## 2018-03-04 ENCOUNTER — Other Ambulatory Visit: Payer: Self-pay | Admitting: Family Medicine

## 2018-03-05 ENCOUNTER — Other Ambulatory Visit: Payer: Self-pay | Admitting: Family Medicine

## 2018-03-05 DIAGNOSIS — R0683 Snoring: Secondary | ICD-10-CM

## 2018-03-05 NOTE — Procedures (Signed)
   Patient Name: Rebecca Brooks, Rebecca Brooks Date: 02/21/2018 Gender: Female D.O.B: [birth year] Age (years): 36 Referring Provider: Fonnie Mu PA-C Height (inches): 62 Interpreting Physician: Baird Lyons MD, ABSM Weight (lbs): 271 RPSGT: Baxter Flattery BMI: 50 MRN: [id number] Neck Size: 16.00  CLINICAL INFORMATION Sleep Study Type: NPSG Indication for sleep study: Obesity, Snoring, Witnesses Apnea / Gasping During Sleep  Epworth Sleepiness Score: 6  SLEEP STUDY TECHNIQUE As per the AASM Manual for the Scoring of Sleep and Associated Events v2.3 (April 2016) with a hypopnea requiring 4% desaturations.  The channels recorded and monitored were frontal, central and occipital EEG, electrooculogram (EOG), submentalis EMG (chin), nasal and oral airflow, thoracic and abdominal wall motion, anterior tibialis EMG, snore microphone, electrocardiogram, and pulse oximetry.  MEDICATIONS Medications self-administered by patient taken the night of the study : ZOLPIDEM TARTRATE  SLEEP ARCHITECTURE The study was initiated at 11:41:36 PM and ended at 5:42:49 AM.  Sleep onset time was 0.4 minutes and the sleep efficiency was 77.2%%. The total sleep time was 278.8 minutes.  Stage REM latency was 45.0 minutes.  The patient spent 6.3%% of the night in stage N1 sleep, 71.8%% in stage N2 sleep, 0.0%% in stage N3 and 21.9% in REM.  Alpha intrusion was absent.  Supine sleep was 39.11%.  RESPIRATORY PARAMETERS The overall apnea/hypopnea index (AHI) was 27.1 per hour. There were 43 total apneas, including 42 obstructive, 1 central and 0 mixed apneas. There were 83 hypopneas and 0 RERAs.  The AHI during Stage REM sleep was 58.0 per hour.  AHI while supine was 23.7 per hour.  The mean oxygen saturation was 89.4%. The minimum SpO2 during sleep was 70.0%.  soft snoring was noted during this study.  CARDIAC DATA The 2 lead EKG demonstrated sinus rhythm. The mean heart rate was 53.5 beats per  minute.  LEG MOVEMENT DATA The total PLMS were 0 with a resulting PLMS index of 0.0. Associated arousal with leg movement index was 0.0 .  IMPRESSIONS - Moderate obstructive sleep apnea occurred during this study (AHI = 27.1/h). - No significant central sleep apnea occurred during this study (CAI = 0.2/h). - Oxygen desaturation was noted during this study (Min O2 = 70.0%, Mean 89.4%). - The patient snored with soft snoring volume. - Clinically significant periodic limb movements did not occur during sleep. No significant associated arousals.  DIAGNOSIS - Obstructive Sleep Apnea (327.23 [G47.33 ICD-10]) - Nocturnal Hypoxemia (327.26 [G47.36 ICD-10])  RECOMMENDATIONS - Suggest CPAP titration sleep study or DME autopap. Other options would be based on clinical judgment. - Be careful with alcohol, sedatives and other CNS depressants that may worsen sleep apnea and disrupt normal sleep architecture. - Sleep hygiene should be reviewed to assess factors that may improve sleep quality. - Weight management and regular exercise should be initiated or continued if appropriate.  [Electronically signed] 03/05/2018 03:02 PM  Baird Lyons MD, [place], American Board of Sleep Medicine   NPI: [id number]                          [place], [place] [facility]  ELECTRONICALLY SIGNED ON:  03/05/2018, 2:52 PM [place] PH: [phone]   FX: [phone] [place]

## 2018-03-05 NOTE — Telephone Encounter (Signed)
Requesting:tramadol Contract:yes UDS:low risk next screen 07/03/18 Last OV:01/01/18 Next OV:10/227/19 Last Refill:12/24/17 #[id number] Database:   Please advise

## 2018-03-09 ENCOUNTER — Other Ambulatory Visit: Payer: Self-pay | Admitting: Family Medicine

## 2018-03-11 ENCOUNTER — Telehealth (INDEPENDENT_AMBULATORY_CARE_PROVIDER_SITE_OTHER): Payer: Self-pay

## 2018-03-11 ENCOUNTER — Encounter: Payer: Self-pay | Admitting: Family Medicine

## 2018-03-11 NOTE — Telephone Encounter (Signed)
Submitted for Synvisc Series, Bilateral Knee.

## 2018-03-11 NOTE — Telephone Encounter (Signed)
Received a fax from [facility] stating that patient is denied for Hyalgan being that patient has not tried and failed Synvisc/SynviscOne.   Will submit for Synvisc Series, bilateral knee.

## 2018-03-14 ENCOUNTER — Telehealth (INDEPENDENT_AMBULATORY_CARE_PROVIDER_SITE_OTHER): Payer: Self-pay

## 2018-03-14 NOTE — Telephone Encounter (Signed)
Please schedule appointment with Dr. Estanislado Pandy for injection. Thank You.  Patient approved for Synvisc Series, Bilateral Knee Buy & Bill Patient will be responsible for 20% OOP.  May have a Co-pay of $40.00 No PA required

## 2018-03-18 ENCOUNTER — Telehealth: Payer: Self-pay | Admitting: Rheumatology

## 2018-03-18 NOTE — Telephone Encounter (Signed)
Patient's husband Lysbeth Galas called stating they received the denial letter from [facility] for the Hyalgan injections and want to know if Dr. Estanislado Pandy would recommend appealing the decision.   Patient has had the Hyalgan injections in the past and they have worked and she is skeptical about the new injections that [facility] approved.

## 2018-03-19 ENCOUNTER — Telehealth (INDEPENDENT_AMBULATORY_CARE_PROVIDER_SITE_OTHER): Payer: Self-pay

## 2018-03-19 NOTE — Telephone Encounter (Signed)
Here is the phone number I have for [employer] to appeal for Denial of Hyalgan.  [phone] or you may try [phone].  [employer] will be refaxing another denial letter so it can be scanned into patient's chart.  I will be sending over some paperwork that I received.  Please call me if you have any questions. [phone].  Thank You.

## 2018-03-19 NOTE — Telephone Encounter (Signed)
Noted  

## 2018-03-19 NOTE — Telephone Encounter (Signed)
LMOM for patient and spouse.

## 2018-03-19 NOTE — Telephone Encounter (Signed)
Please see below. Is it possible to do a prior authorization for Hyalgan? Thank you.

## 2018-03-19 NOTE — Telephone Encounter (Signed)
Please see the above message.

## 2018-03-19 NOTE — Telephone Encounter (Signed)
Please send appeal letter. Patient stated she tried Euflexxa and Hyalgan in the past. Patient stated she had better results with Hyalgan series of 5 verses the Euflexxa series of 3. Patient has been approved for Syvisc series of 3. Thank you.

## 2018-03-20 ENCOUNTER — Encounter: Payer: Self-pay | Admitting: *Deleted

## 2018-03-20 NOTE — Telephone Encounter (Signed)
Patient called and wanted to know the status of the auto titration machine. She states that she is very anxious to get a good night sleep. She is wanting to make sure this is getting done CB# [phone]

## 2018-03-20 NOTE — Telephone Encounter (Signed)
Letter written and will fax to insurance company.

## 2018-03-22 NOTE — Telephone Encounter (Signed)
Can you look into her referral

## 2018-03-26 NOTE — Progress Notes (Signed)
Office Visit Note  Patient: Rebecca Brooks             Date of Birth: [birth year]           MRN: [id number]             PCP: Mosie Lukes, MD Referring: Mosie Lukes, MD Visit Date: 04/09/2018 Occupation: @GUAROCC @  Subjective:  Bilateral knee pain  History of Present Illness: JABRIA LOOS is a 67 y.o. female with history of osteoarthritis and DDD.  She is having discomfort in both knee joints.  The appeal process was declined for hyalgan injections in both knees.  She would like bilateral cortisone knee joint injections today.  She states the pain is most severe in the right knee joint.  She states she has warmth in the right knee but denies any joint swelling.  She states she has been trying to lose weight in order to have a knee replacement.  She has noticed slightly less knee pain since losing weight.  She states she has significant pain with steps.  She continues to have chronic right shoulder pain, especially at night when laying on the right side.  She reports she has a history of frozen shoulder, and she continues to have limited ROM . She has pain in both hands but denies any swelling.  She paints on a daily basis.  She denies any neck pain at this time.  She has occasional lower back pain.     Activities of Daily Living:  Patient reports morning stiffness for 30 minutes.   Patient Reports nocturnal pain.  Difficulty dressing/grooming: Denies Difficulty climbing stairs: Reports Difficulty getting out of chair: Reports Difficulty using hands for taps, buttons, cutlery, and/or writing: Reports  Review of Systems  Constitutional: Negative for fatigue.  HENT: Negative for mouth sores, mouth dryness and nose dryness.   Eyes: Negative for pain, visual disturbance and dryness.  Respiratory: Negative for cough, hemoptysis, shortness of breath and difficulty breathing.   Cardiovascular: Negative for chest pain, palpitations, hypertension and swelling in legs/feet.    Gastrointestinal: Negative for blood in stool, constipation and diarrhea.  Endocrine: Negative for increased urination.  Genitourinary: Negative for painful urination.  Musculoskeletal: Positive for arthralgias, joint pain, joint swelling and morning stiffness. Negative for myalgias, muscle weakness, muscle tenderness and myalgias.  Skin: Negative for color change, pallor, rash, hair loss, nodules/bumps, skin tightness, ulcers and sensitivity to sunlight.  Allergic/Immunologic: Negative for susceptible to infections.  Neurological: Negative for dizziness, numbness, headaches and weakness.  Hematological: Negative for swollen glands.  Psychiatric/Behavioral: Positive for sleep disturbance. Negative for depressed mood. The patient is not nervous/anxious.     PMFS History:  Patient Active Problem List   Diagnosis Date Noted  . Muscle cramps 01/02/2018  . IDA (iron deficiency anemia) 12/22/2016  . Primary osteoarthritis of both knees 10/03/2016  . DDD (degenerative disc disease), cervical 10/03/2016  . DDD (degenerative disc disease), lumbar 10/03/2016  . History of total hip replacement, right 10/03/2016  . History of hypothyroidism 10/03/2016  . History of hypertension 10/03/2016  . History of anemia 10/03/2016  . Chondromalacia 10/03/2016  . Primary osteoarthritis of shoulder 10/03/2016  . History of high cholesterol 10/03/2016  . Primary osteoarthritis of both hands 10/03/2016  . Abnormal nuclear stress test 08/23/2015  . Globus sensation 08/13/2015  . Special screening for malignant neoplasms, colon 08/13/2015  . Chest pain at rest   . Chest pain with moderate risk of acute coronary syndrome 07/28/2015  .  Annual physical exam 07/11/2015  . RLS (restless legs syndrome) 11/01/2014  . Insomnia 11/01/2014  . Thoracic back pain 01/12/2014  . Allergic rhinitis 11/03/2013  . Preventative health care 12/13/2012  . Recurrent epistaxis 02/20/2012  . Shoulder pain, bilateral 11/01/2011   . Arthralgia of both knees 10/18/2011  . Peripheral edema 09/10/2011  . Asthma 08/19/2011  . Palpitations 08/01/2011  . Anemia 08/01/2011  . Hypokalemia 08/01/2011  . Elevated WBC count 08/01/2011  . GERD (gastroesophageal reflux disease)   . Low back pain radiating to right leg   . Morbid obesity ([place]) 07/17/2011  . Hypothyroidism 07/17/2011  . Hyperlipidemia 07/17/2011  . Essential hypertension 07/17/2011    Past Medical History:  Diagnosis Date  . Abnormal nuclear stress test 08/23/2015  . Arthritis   . Chicken pox as a child  . Essential hypertension 07/17/2011   ACEI d/c 06/2011 for refractory cough> resolved   . GERD (gastroesophageal reflux disease)   . Hyperlipidemia   . Hypertension   . Hypothyroidism   . Insomnia 11/01/2014  . Low back pain radiating to right leg    intermittent and occasional numbness of skin over right hip in certain positions  . Measles as a child  . Mumps as a child  . Obesity   . Pneumonia    this October from which she has said inhailer  . Recurrent epistaxis 02/20/2012  . Rheumatoid arthritis ([place]) 09/10/2011   Bone on bone in knees Pain in L>R knees.   . RLS (restless legs syndrome) 11/01/2014    Family History  Problem Relation Age of Onset  . Lung cancer Mother 72       smoker  . Heart Problems Mother        tachycardia  . Hearing loss Mother   . Anxiety disorder Mother        anxiety, claustrophobia  . Osteoarthritis Father   . Hyperlipidemia Father   . Anuerysm Father        AAA  . Hyperlipidemia Sister   . Migraines Sister   . Hypertension Sister   . Colon cancer Maternal Grandmother   . Heart attack Maternal Grandfather   . Aneurysm Paternal Grandfather        abdominal  . Heart disease Paternal Grandfather        AAA rupture, smoker  . Heart Problems Sister        tachycardia   Past Surgical History:  Procedure Laterality Date  . CARDIAC CATHETERIZATION N/A 08/23/2015   Procedure: Left Heart Cath and Coronary  Angiography;  Surgeon: [name], MD;  Location: [facility];  Service: Cardiovascular;  Laterality: N/A;  . CESAREAN SECTION     X 3  . GASTRECTOMY  12/2017  . INGUINAL HERNIA REPAIR Right 67 yrs old  . THYROIDECTOMY     total for benign tumor, Parathyroid spared  . TONSILLECTOMY    . TOTAL HIP ARTHROPLASTY Right 2006   secondary to congenital  hip defect   Social History   Social History Narrative  . Not on file    Objective: Vital Signs: BP 130/82 (BP Location: Left Wrist, Patient Position: Sitting, Cuff Size: Normal)   Pulse 65   Resp 15   Ht 5\' 2"  (1.575 m)   Wt 265 lb 6.4 oz (120.4 kg)   BMI 48.54 kg/m    Physical Exam  Constitutional: She is oriented to person, place, and time. She appears well-developed and well-nourished.  HENT:  Head: Normocephalic and atraumatic.  Eyes: Conjunctivae and EOM are normal.  Neck: Normal range of motion.  Cardiovascular: Normal rate, regular rhythm, normal heart sounds and intact distal pulses.  Pulmonary/Chest: Effort normal and breath sounds normal.  Abdominal: Soft. Bowel sounds are normal.  Lymphadenopathy:    She has no cervical adenopathy.  Neurological: She is alert and oriented to person, place, and time.  Skin: Skin is warm and dry. Capillary refill takes less than 2 seconds.  Psychiatric: She has a normal mood and affect. Her behavior is normal.  Nursing note and vitals reviewed.    Musculoskeletal Exam: C-spine, thoracic spine, lumbar spine good range of motion.  No midline spinal tenderness.  No SI joint tenderness.  Right shoulder abduction to 120 degrees.  Left shoulder full range of motion with no discomfort.  Elbow joints, wrist joints, MCPs, PIPs, DIPs good range of motion with no synovitis.  She has PIP and DIP synovial thickening of consistent with osteoarthritis of bilateral hands.  She has bilateral CMC joint synovial thickening.  Right hip replacement doing well with no discomfort.  Left hip good range  of motion with no discomfort.  Knee joints, ankle joints, MTPs, PIPs, DIPs good range of motion no synovitis.  Right knee warmth.  No tenderness or swelling of ankle joints.   CDAI Exam: CDAI Score: Not documented Patient Global Assessment: Not documented; Provider Global Assessment: Not documented Swollen: Not documented; Tender: Not documented Joint Exam   Not documented   There is currently no information documented on the homunculus. Go to the Rheumatology activity and complete the homunculus joint exam.  Investigation: No additional findings.  Imaging: No results found.  Recent Labs: Lab Results  Component Value Date   WBC 10.7 (H) 04/04/2018   HGB 14.7 04/04/2018   PLT 352 04/04/2018   NA 142 12/21/2017   K 3.6 12/21/2017   CL 102 12/21/2017   CO2 28 12/21/2017   GLUCOSE 123 12/21/2017   BUN 19 12/21/2017   CREATININE 0.76 12/21/2017   BILITOT 0.5 12/21/2017   ALKPHOS 98 12/21/2017   AST 16 12/21/2017   ALT 17 12/21/2017   PROT 6.7 12/21/2017   ALBUMIN 3.4 (L) 12/21/2017   CALCIUM 9.4 12/21/2017   GFRAA >60 12/21/2017    Speciality Comments: No specialty comments available.  Procedures:  Large Joint Inj: bilateral knee on 04/09/2018 2:22 PM Indications: pain Details: 27 G 1.5 in needle, medial approach  Arthrogram: No  Medications (Right): 1.5 mL lidocaine 1 %; 40 mg triamcinolone acetonide 40 MG/ML Aspirate (Right): 0 mL Medications (Left): 1.5 mL lidocaine 1 %; 40 mg triamcinolone acetonide 40 MG/ML Aspirate (Left): 0 mL Outcome: tolerated well, no immediate complications Procedure, treatment alternatives, risks and benefits explained, specific risks discussed. Consent was given by the patient. Immediately prior to procedure a time out was called to verify the correct patient, procedure, equipment, support staff and site/side marked as required. Patient was prepped and draped in the usual sterile fashion.     Allergies: Bee venom; Accupril [quinapril  hcl]; Quinapril; and Quinapril-hydrochlorothiazide   Assessment / Plan:     Visit Diagnoses: Primary osteoarthritis of both hands: She has PIP and DIP synovial thickening consistent with osteoarthritis of bilateral hands.  She has bilateral CMC joint synovial thickening.  She has complete fist formation bilaterally.  She has discomfort in bilateral hands as well as joint stiffness.  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  Joint protection and muscle strengthening were discussed.  She can use voltaren gel PRN.   Primary osteoarthritis of both knees: She has right knee warmth on exam.  She has discomfort in bilateral knee joints more severe in her right knee joint.  We are currently going through a second appeal for Hyalgan bilateral knee injections. She requested cortisone injections in bilateral knee joints today.  She tolerated procedure well.  Potential side effects were discussed.  She was advised to monitor her blood pressure closely following the cortisone injections.  A refill for Voltaren gel was sent to the pharmacy today.  Primary osteoarthritis of both shoulders: Right shoulder abduction 120 degrees.  She has a history of adhesive capsulitis of the right shoulder.  She tries to work on range of motion a regular basis.  She has pain when lying on her right side at night.  History of total hip replacement, right: Doing well.  She has no discomfort at this time.  She is good range of motion.  DDD (degenerative disc disease), cervical: She has good range of motion with no discomfort.  She has no symptoms of radiculopathy at this time.  DDD (degenerative disc disease), lumbar: She has occasional lower back pain.  She has no midline spinal tenderness on exam today.  Primary insomnia: She takes Ambien as needed at bedtime.  Other medical conditions are listed as follows:  History of high cholesterol  Peripheral edema  History of obesity  History of  hypothyroidism  History of hypertension  History of anemia   Orders: Orders Placed This Encounter  Procedures  . Large Joint Inj   Meds ordered this encounter  Medications  . diclofenac sodium (VOLTAREN) 1 % GEL    Sig: Apply 3 grams to 3 large joints up to 3 times daily    Dispense:  3 Tube    Refill:  3    Face-to-face time spent with patient was 30 minutes. Greater than 50% of time was spent in counseling and coordination of care.  Follow-Up Instructions: Return in about 6 months (around 10/08/2018) for Osteoarthritis, DDD.   Ofilia Neas, PA-C  Note - This record has been created using Dragon software.  Chart creation errors have been sought, but may not always  have been located. Such creation errors do not reflect on  the standard of medical care.

## 2018-03-27 ENCOUNTER — Telehealth: Payer: Self-pay | Admitting: Rheumatology

## 2018-03-27 NOTE — Telephone Encounter (Signed)
I attempted to call Rebecca Brooks, but no voicemail box was set up.   The patient does have morning stiffness >30 minutes daily.  She has not tried Synvisc in the past.  She had tried Hyalgan, which was effective and she would like to try to proceed with Hyalgan injections.

## 2018-03-27 NOTE — Telephone Encounter (Signed)
Cecilie Lowers from [facility] called stating he needs a call back by 1:15 pm today regarding patient's appeal.  Please call back with the following information:  (1) Does patient have morning stiffness less than 30 min and does patient have crepitus with knee motion.  (2)  Has patient tried Synvisc, Synvisc1   Please call Cecilie Lowers at [phone]

## 2018-03-29 ENCOUNTER — Telehealth: Payer: Self-pay | Admitting: *Deleted

## 2018-03-29 DIAGNOSIS — H524 Presbyopia: Secondary | ICD-10-CM | POA: Diagnosis not present

## 2018-03-29 NOTE — Telephone Encounter (Signed)
Check with patient and we can refer to pulmonology. I do not order sleep studies and did not order this one. I refer to pulmonology since they manage the CPAP these days. If who ever ordered her test is not going to manage it I can order the titration and refer so they know more when they see her. We could do either

## 2018-03-29 NOTE — Telephone Encounter (Signed)
Received call from [facility] that pt was calling stating she has not been contacted to schedule her CPAP titration study. Please see 03/11/18 pt email. Your note says to have pulmonology order the titration study but I do not see that pt is established with pulmonology. Original sleep study was ordered by her bariatric doctor.  Please advise?

## 2018-03-29 NOTE — Telephone Encounter (Signed)
Spoke with patient she stated she did not want to see a pulmonologist in [place], however she would contact [facility] and have them set up the test that she is needing

## 2018-04-02 ENCOUNTER — Telehealth (INDEPENDENT_AMBULATORY_CARE_PROVIDER_SITE_OTHER): Payer: Self-pay

## 2018-04-02 ENCOUNTER — Telehealth: Payer: Self-pay | Admitting: *Deleted

## 2018-04-02 NOTE — Telephone Encounter (Signed)
Spoke with patient to advise that the appeal for Hyalgan has been denied. Patient states she was advised that the reason it was denied was because when the insurance company contacted the office they were told the patient did not have morning stiffness for less than 30 minutes and that she did not have a grinding in her knees. Patient states that the information given to the insurance company was incorrect. Patient states she will speak with Dr. Estanislado Pandy at her appointment on 04/09/18 and appeal again after that appointment.

## 2018-04-02 NOTE — Telephone Encounter (Signed)
Received Appeal Status letter from [facility] stating that appeal has been denied.

## 2018-04-03 ENCOUNTER — Other Ambulatory Visit: Payer: Self-pay | Admitting: Family Medicine

## 2018-04-04 ENCOUNTER — Encounter: Payer: Self-pay | Admitting: Family

## 2018-04-04 ENCOUNTER — Inpatient Hospital Stay: Payer: Medicare Other | Attending: Family | Admitting: Family

## 2018-04-04 ENCOUNTER — Inpatient Hospital Stay: Payer: Medicare Other

## 2018-04-04 ENCOUNTER — Other Ambulatory Visit: Payer: Self-pay

## 2018-04-04 VITALS — BP 114/51 | HR 72 | Temp 98.3°F | Resp 18 | Wt 265.0 lb

## 2018-04-04 DIAGNOSIS — D72828 Other elevated white blood cell count: Secondary | ICD-10-CM | POA: Insufficient documentation

## 2018-04-04 DIAGNOSIS — K5900 Constipation, unspecified: Secondary | ICD-10-CM

## 2018-04-04 DIAGNOSIS — G5600 Carpal tunnel syndrome, unspecified upper limb: Secondary | ICD-10-CM | POA: Insufficient documentation

## 2018-04-04 DIAGNOSIS — D508 Other iron deficiency anemias: Secondary | ICD-10-CM

## 2018-04-04 DIAGNOSIS — R21 Rash and other nonspecific skin eruption: Secondary | ICD-10-CM | POA: Insufficient documentation

## 2018-04-04 DIAGNOSIS — K909 Intestinal malabsorption, unspecified: Secondary | ICD-10-CM | POA: Diagnosis not present

## 2018-04-04 DIAGNOSIS — M1990 Unspecified osteoarthritis, unspecified site: Secondary | ICD-10-CM | POA: Insufficient documentation

## 2018-04-04 DIAGNOSIS — G4730 Sleep apnea, unspecified: Secondary | ICD-10-CM | POA: Diagnosis not present

## 2018-04-04 DIAGNOSIS — Z79899 Other long term (current) drug therapy: Secondary | ICD-10-CM | POA: Diagnosis not present

## 2018-04-04 DIAGNOSIS — D72829 Elevated white blood cell count, unspecified: Secondary | ICD-10-CM

## 2018-04-04 LAB — CBC WITH DIFFERENTIAL (CANCER CENTER ONLY)
BASOS PCT: 1 %
Basophils Absolute: 0.1 10*3/uL (ref 0.0–0.1)
EOS ABS: 0.1 10*3/uL (ref 0.0–0.5)
EOS PCT: 1 %
HCT: 44.4 % (ref 34.8–46.6)
HEMOGLOBIN: 14.7 g/dL (ref 11.6–15.9)
LYMPHS ABS: 2 10*3/uL (ref 0.9–3.3)
Lymphocytes Relative: 19 %
MCH: 30.1 pg (ref 26.0–34.0)
MCHC: 33.1 g/dL (ref 32.0–36.0)
MCV: 91 fL (ref 81.0–101.0)
Monocytes Absolute: 0.7 10*3/uL (ref 0.1–0.9)
Monocytes Relative: 6 %
Neutro Abs: 7.9 10*3/uL — ABNORMAL HIGH (ref 1.5–6.5)
Neutrophils Relative %: 73 %
PLATELETS: 352 10*3/uL (ref 145–400)
RBC: 4.88 MIL/uL (ref 3.70–5.32)
RDW: 14.4 % (ref 11.1–15.7)
WBC: 10.7 10*3/uL — AB (ref 3.9–10.0)

## 2018-04-04 LAB — RETICULOCYTES
RBC.: 4.76 MIL/uL (ref 3.70–5.45)
RETIC CT PCT: 2 % (ref 0.7–2.1)
Retic Count, Absolute: 95.2 10*3/uL — ABNORMAL HIGH (ref 33.7–90.7)

## 2018-04-04 NOTE — Progress Notes (Signed)
Hematology and Oncology Follow Up Visit  Rebecca Brooks [id number] 1950/10/25 67 y.o. 04/04/2018   Principle Diagnosis:  Reactive leukocytosis Iron deficiency anemia  Thrombocytosis  Current Therapy:   IV iron as indicated - last received 2 doses in July 2019   Interim History: Rebecca Brooks is here today for follow-up. She is feeling fatigued and states that she is not sleeping well. She was recently diagnosed with sleep apnea and goes for her titration study on October 4th.  She has had no episodes of bleeding. No bruising or petechiae.  She has a red sand paper rash under her eyes she feels is due to a new make-up which she has since stopped using. No open sores noted. No fever, chills, n/v, cough, dizziness, SOB, chest pain, palpitations, abdominal pain or changes in bowel or bladder habits.  She takes Senakot for constipation as needed.  No swelling or tenderness in her extremities. She has numbness and tingling in her right hand due to carpal tunnel.  No lymphadenopathy noted on exam.  She has a good appetite and is staying well hydrated. Her weight is stable.   ECOG Performance Status: 1 - Symptomatic but completely ambulatory  Medications:  Allergies as of 04/04/2018      Reactions   Bee Venom Anaphylaxis   Accupril [quinapril Hcl] Cough   Quinapril-hydrochlorothiazide Cough      Medication List        Accurate as of 04/04/18  1:24 PM. Always use your most recent med list.          albuterol 108 (90 Base) MCG/ACT inhaler Commonly known as:  PROVENTIL HFA;VENTOLIN HFA Inhale 2 puffs into the lungs every 6 (six) hours as needed for wheezing or shortness of breath.   ALPRAZolam 0.25 MG tablet Commonly known as:  XANAX Take 1 tablet (0.25 mg total) by mouth daily as needed for anxiety.   ANTI-INFLAMMATORY ENZYME Caps APPLY 1 2 GMS TO AFFECTED AREA 3 4 TIMES DAILY AS NEEDED FOR PAIN  THIS IS A COMPOUNDED CREAM 1 PUMP EQUALS 1 GRAM   aspirin EC 81 MG tablet Take 1  tablet (81 mg total) by mouth daily.   cetirizine 10 MG tablet Commonly known as:  ZYRTEC TAKE 1 TABLET BY MOUTH DAILY   diclofenac sodium 1 % Gel Commonly known as:  VOLTAREN APPLY 4 GRAMS TO 3 LARGE JOINTS THREE TIMES DAILY AS NEEDED   famotidine 20 MG tablet Commonly known as:  PEPCID TAKE 1 TABLET(20 MG) BY MOUTH AT BEDTIME   fluticasone 50 MCG/ACT nasal spray Commonly known as:  FLONASE SHAKE LIQUID AND USE 2 SPRAYS IN EACH NOSTRIL DAILY   furosemide 20 MG tablet Commonly known as:  LASIX TAKE 2 TABLETS BY MOUTH DAILY FOR 5 DAYS THEN DECREASE TO 1 TABLET BY MOUTH DAILY AS NEEDED   hydrochlorothiazide 25 MG tablet Commonly known as:  HYDRODIURIL TAKE 1 TABLET BY MOUTH DAILY   levothyroxine 100 MCG tablet Commonly known as:  SYNTHROID, LEVOTHROID TAKE 1 TABLET BY MOUTH DAILY BEFORE BREAKFAST   MELATONIN PO Take 1 tablet by mouth at bedtime as needed (sleep).   metoprolol tartrate 50 MG tablet Commonly known as:  LOPRESSOR TAKE 1 TABLET BY MOUTH TWICE DAILY   nitroGLYCERIN 0.4 MG SL tablet Commonly known as:  NITROSTAT Place 1 tablet (0.4 mg total) under the tongue every 5 (five) minutes as needed for chest pain.   pantoprazole 40 MG tablet Commonly known as:  PROTONIX Take 1 tablet (40 mg  total) by mouth daily.   pantoprazole 40 MG tablet Commonly known as:  PROTONIX TAKE 1 TABLET BY MOUTH DAILY   potassium chloride SA 20 MEQ tablet Commonly known as:  K-DUR,KLOR-CON TAKE 1 TABLET BY MOUTH DAILY AS NEEDED(DEPENDING ON LASIX USE)   rOPINIRole 0.5 MG tablet Commonly known as:  REQUIP Take 1-2 tablets (0.5-1 mg total) by mouth at bedtime as needed.   simvastatin 40 MG tablet Commonly known as:  ZOCOR TAKE 1 TABLET BY MOUTH EVERY EVENING   traMADol 50 MG tablet Commonly known as:  ULTRAM TAKE 1 TO 2 TABLETS BY MOUTH EVERY 8 HOURS AS NEEDED   Vitamin B-12 1000 MCG Subl Place under the tongue.   Vitamin D3 1000 units Caps Take 1 capsule (1,000 Units  total) daily by mouth.   zolpidem 10 MG tablet Commonly known as:  AMBIEN Take 1 tablet (10 mg total) by mouth at bedtime as needed. for sleep       Allergies:  Allergies  Allergen Reactions  . Bee Venom Anaphylaxis  . Accupril [Quinapril Hcl] Cough  . Quinapril-Hydrochlorothiazide Cough    Past Medical History, Surgical history, Social history, and Family History were reviewed and updated.  Review of Systems: All other 10 point review of systems is negative.   Physical Exam:  vitals were not taken for this visit.   Wt Readings from Last 3 Encounters:  02/21/18 271 lb (122.9 kg)  02/01/18 281 lb (127.5 kg)  01/04/18 295 lb (133.8 kg)    Ocular: Sclerae unicteric, pupils equal, round and reactive to light Ear-nose-throat: Oropharynx clear, dentition fair Lymphatic: No cervical, supraclavicular or axillary adenopathy Lungs no rales or rhonchi, good excursion bilaterally Heart regular rate and rhythm, no murmur appreciated Abd soft, nontender, positive bowel sounds, no liver or spleen tip palpated on exam, no fluid wave  MSK no focal spinal tenderness, no joint edema Neuro: non-focal, well-oriented, appropriate affect Breasts: Deferred   Lab Results  Component Value Date   WBC 10.7 (H) 04/04/2018   HGB 14.7 04/04/2018   HCT 44.4 04/04/2018   MCV 91.0 04/04/2018   PLT 352 04/04/2018   Lab Results  Component Value Date   FERRITIN 277 02/01/2018   IRON 43 02/01/2018   TIBC 251 02/01/2018   UIBC 208 02/01/2018   IRONPCTSAT 17 (L) 02/01/2018   Lab Results  Component Value Date   RETICCTPCT 1.2 02/01/2018   RBC 4.88 04/04/2018   No results found for: KPAFRELGTCHN, LAMBDASER, KAPLAMBRATIO No results found for: IGGSERUM, IGA, IGMSERUM No results found for: Odetta Pink, SPEI   Chemistry      Component Value Date/Time   NA 142 12/21/2017 1327   NA 143 06/22/2017 1311   NA 142 12/21/2016 0922   K 3.6  12/21/2017 1327   K 3.1 (L) 06/22/2017 1311   K 3.5 12/21/2016 0922   CL 102 12/21/2017 1327   CL 99 06/22/2017 1311   CO2 28 12/21/2017 1327   CO2 32 06/22/2017 1311   CO2 30 (H) 12/21/2016 0922   BUN 19 12/21/2017 1327   BUN 21 06/22/2017 1311   BUN 22.4 12/21/2016 0922   CREATININE 0.76 12/21/2017 1327   CREATININE 0.9 06/22/2017 1311   CREATININE 0.8 12/21/2016 0922      Component Value Date/Time   CALCIUM 9.4 12/21/2017 1327   CALCIUM 8.9 06/22/2017 1311   CALCIUM 9.2 12/21/2016 0922   ALKPHOS 98 12/21/2017 1327   ALKPHOS 99 (H) 06/22/2017  1311   ALKPHOS 114 12/21/2016 0922   AST 16 12/21/2017 1327   AST 15 12/21/2016 0922   ALT 17 12/21/2017 1327   ALT 19 06/22/2017 1311   ALT 13 12/21/2016 0922   BILITOT 0.5 12/21/2017 1327   BILITOT 0.29 12/21/2016 2897      Impression and Plan: Ms. Asmus is a very pleasant 67 yo caucasian female with mild reactive leukocytosis with arthritis and iron deficiency secondary to malabsorption.  We will see what her iron studies show and bring her back in for infusion if needed.  We will plan to see her back in another 3 months for follow-up.  She will contact our office with any questions or concerns. We can certainly see her sooner if need be.   Laverna Peace, NP 9/12/20191:24 PM

## 2018-04-05 LAB — IRON AND TIBC
Iron: 80 ug/dL (ref 41–142)
SATURATION RATIOS: 28 % (ref 21–57)
TIBC: 286 ug/dL (ref 236–444)
UIBC: 206 ug/dL

## 2018-04-05 LAB — FERRITIN: Ferritin: 469 ng/mL — ABNORMAL HIGH (ref 11–307)

## 2018-04-05 NOTE — Telephone Encounter (Signed)
Patient states she wants to wait to schedule until her appointment with Dr. Estanislado Pandy on 04/09/18.  Patient states she wants to appeal again to her insurance company for the Hyalgan injections.

## 2018-04-09 ENCOUNTER — Encounter: Payer: Self-pay | Admitting: Rheumatology

## 2018-04-09 ENCOUNTER — Ambulatory Visit: Payer: Medicare Other | Admitting: Rheumatology

## 2018-04-09 VITALS — BP 130/82 | HR 65 | Resp 15 | Ht 62.0 in | Wt 265.4 lb

## 2018-04-09 DIAGNOSIS — M19042 Primary osteoarthritis, left hand: Secondary | ICD-10-CM

## 2018-04-09 DIAGNOSIS — M19041 Primary osteoarthritis, right hand: Secondary | ICD-10-CM | POA: Diagnosis not present

## 2018-04-09 DIAGNOSIS — M5136 Other intervertebral disc degeneration, lumbar region: Secondary | ICD-10-CM

## 2018-04-09 DIAGNOSIS — M19011 Primary osteoarthritis, right shoulder: Secondary | ICD-10-CM | POA: Diagnosis not present

## 2018-04-09 DIAGNOSIS — M25562 Pain in left knee: Secondary | ICD-10-CM | POA: Diagnosis not present

## 2018-04-09 DIAGNOSIS — R609 Edema, unspecified: Secondary | ICD-10-CM

## 2018-04-09 DIAGNOSIS — G8929 Other chronic pain: Secondary | ICD-10-CM | POA: Diagnosis not present

## 2018-04-09 DIAGNOSIS — F5101 Primary insomnia: Secondary | ICD-10-CM

## 2018-04-09 DIAGNOSIS — M19012 Primary osteoarthritis, left shoulder: Secondary | ICD-10-CM

## 2018-04-09 DIAGNOSIS — Z96641 Presence of right artificial hip joint: Secondary | ICD-10-CM

## 2018-04-09 DIAGNOSIS — Z8639 Personal history of other endocrine, nutritional and metabolic disease: Secondary | ICD-10-CM

## 2018-04-09 DIAGNOSIS — M25561 Pain in right knee: Secondary | ICD-10-CM | POA: Diagnosis not present

## 2018-04-09 DIAGNOSIS — Z8679 Personal history of other diseases of the circulatory system: Secondary | ICD-10-CM

## 2018-04-09 DIAGNOSIS — R600 Localized edema: Secondary | ICD-10-CM

## 2018-04-09 DIAGNOSIS — M5030 Other cervical disc degeneration, unspecified cervical region: Secondary | ICD-10-CM

## 2018-04-09 DIAGNOSIS — Z862 Personal history of diseases of the blood and blood-forming organs and certain disorders involving the immune mechanism: Secondary | ICD-10-CM

## 2018-04-09 DIAGNOSIS — M170 Bilateral primary osteoarthritis of knee: Secondary | ICD-10-CM

## 2018-04-09 DIAGNOSIS — M51369 Other intervertebral disc degeneration, lumbar region without mention of lumbar back pain or lower extremity pain: Secondary | ICD-10-CM

## 2018-04-09 MED ORDER — DICLOFENAC SODIUM 1 % TD GEL: TRANSDERMAL | 3 refills | Status: DC

## 2018-04-09 MED ORDER — LIDOCAINE HCL 1 % IJ SOLN
1.5000 mL | INTRAMUSCULAR | Status: AC | PRN
  Administered 2018-04-09: 1.5 mL

## 2018-04-09 MED ORDER — TRIAMCINOLONE ACETONIDE 40 MG/ML IJ SUSP
40.0000 mg | INTRAMUSCULAR | Status: AC | PRN
  Administered 2018-04-09: 40 mg via INTRA_ARTICULAR

## 2018-04-11 DIAGNOSIS — Z713 Dietary counseling and surveillance: Secondary | ICD-10-CM | POA: Diagnosis not present

## 2018-04-11 DIAGNOSIS — Z6841 Body Mass Index (BMI) 40.0 and over, adult: Secondary | ICD-10-CM | POA: Diagnosis not present

## 2018-04-11 DIAGNOSIS — E569 Vitamin deficiency, unspecified: Secondary | ICD-10-CM | POA: Diagnosis not present

## 2018-04-20 ENCOUNTER — Other Ambulatory Visit: Payer: Self-pay | Admitting: Family Medicine

## 2018-04-26 ENCOUNTER — Encounter: Payer: Self-pay | Admitting: Pulmonary Disease

## 2018-04-26 ENCOUNTER — Ambulatory Visit: Payer: Medicare Other | Admitting: Pulmonary Disease

## 2018-04-26 DIAGNOSIS — G4733 Obstructive sleep apnea (adult) (pediatric): Secondary | ICD-10-CM

## 2018-04-26 DIAGNOSIS — F5104 Psychophysiologic insomnia: Secondary | ICD-10-CM | POA: Diagnosis not present

## 2018-04-26 DIAGNOSIS — G2581 Restless legs syndrome: Secondary | ICD-10-CM | POA: Diagnosis not present

## 2018-04-26 NOTE — Assessment & Plan Note (Signed)
Trial of melatonin 5 to 10 mg at bedtime instead of Ambien Light exercise x 30 minutes daily Light exposure around 8 AM for 30 minutes

## 2018-04-26 NOTE — Assessment & Plan Note (Signed)
Could be a cause of insomnia. Continue Requip

## 2018-04-26 NOTE — Assessment & Plan Note (Signed)
Prescription to DME for auto CPAP 5 to 12 cm with air touch F 20  Weight loss encouraged, compliance with goal of at least 4-6 hrs every night is the expectation. Advised against medications with sedative side effects Cautioned against driving when sleepy - understanding that sleepiness will vary on a day to day basis

## 2018-04-26 NOTE — Patient Instructions (Addendum)
We discussed issues of insomnia and moderate obstructive sleep apnea  Prescription to DME for auto CPAP 5 to 12 cm with F 20 with air touch  Trial of melatonin 5 to 10 mg at bedtime instead of Ambien Light exercise x 30 minutes daily Light exposure around 8 AM for 30 minutes

## 2018-04-26 NOTE — Progress Notes (Signed)
Subjective:    [name], female    DOB: May 24, 1951, 67 y.o.   MRN: [id number]  HPI  67 year old remote smoker presents for evaluation of sleep disordered breathing. She has been evaluated in our office in the past for chronic cough and community-acquired pneumonia  She underwent gastric sleeve surgery 6/29 at [facility], her highest weight was 328 pounds, she has lost about 50 pounds since then to her current weight of 260 pounds. Perioperatively she used CPAP and felt much better with a nasal mask. She reports difficulty getting to sleep and returning to sleep after an awakening.  She reports long-standing insomnia for which she has used Ambien very seldom about once a week but for the past month she has been using this almost nightly. Epworth sleepiness score is 9 and she reports sleepiness while sitting and reading, watching TV, as a passenger in a car or lying down to rest in the afternoon.  Bedtime is between 11 PM and occasionally after midnight since she stays up with her husband to watch TV series.  She is a light sleeper and has frequent nocturnal awakenings from which she finds it hard to fall asleep again, reports 2-3 nocturnal awakenings including nocturia and is out of bed by 8:30 AM feeling tired with dryness of mouth but denies headaches. There is no history suggestive of cataplexy, sleep paralysis or parasomnias  Her primary care physician ordered sleep study which was done on August 2019 when her weight was up to 70 pounds and this showed moderate OSA with AHI 27/hour with lowest desaturation of 70% surprisingly PLM's were not noted. She reports history of restless leg syndrome for at least the past 3 years and Requip has helped. She reports this is a feeling of calves not feeling relaxed There is no history suggestive of cataplexy, sleep paralysis or parasomnias  She denies excessive use of caffeinated beverages.  She admits to sedentary lifestyle due to  severe osteoarthritis. She is retired Adult nurse and does take a trip but once a year when she generally takes the Ambien  Significant tests/ events reviewed  NPSG 02/2018 >> AHI 27/h, wt 271 lbs  PFT's 09/2011 FEV1  2.04 (95%) ratio 78 and ERV 62 with DLCO 46%   Past Medical History:  Diagnosis Date  . Abnormal nuclear stress test 08/23/2015  . Arthritis   . Chicken pox as a child  . Essential hypertension 07/17/2011   ACEI d/c 06/2011 for refractory cough> resolved   . GERD (gastroesophageal reflux disease)   . Hyperlipidemia   . Hypertension   . Hypothyroidism   . Insomnia 11/01/2014  . Low back pain radiating to right leg    intermittent and occasional numbness of skin over right hip in certain positions  . Measles as a child  . Mumps as a child  . Obesity   . Pneumonia    this October from which she has said inhailer  . Recurrent epistaxis 02/20/2012  . Rheumatoid arthritis ([place]) 09/10/2011   Bone on bone in knees Pain in L>R knees.   . RLS (restless legs syndrome) 11/01/2014   Past Surgical History:  Procedure Laterality Date  . CARDIAC CATHETERIZATION N/A 08/23/2015   Procedure: Left Heart Cath and Coronary Angiography;  Surgeon: [name], MD;  Location: [facility];  Service: Cardiovascular;  Laterality: N/A;  . CESAREAN SECTION     X 3  . GASTRECTOMY  12/2017  . INGUINAL HERNIA REPAIR Right 67 yrs  old  . THYROIDECTOMY     total for benign tumor, Parathyroid spared  . TONSILLECTOMY    . TOTAL HIP ARTHROPLASTY Right 2006   secondary to congenital  hip defect    Allergies  Allergen Reactions  . Bee Venom Anaphylaxis  . Accupril [Quinapril Hcl] Cough  . Quinapril   . Quinapril-Hydrochlorothiazide Cough    Social History   Socioeconomic History  . Marital status: Married    Spouse name: Not on file  . Number of children: 3  . Years of education: Not on file  . Highest education level: Not on file  Occupational History  . Occupation: Training and development officer  .  Occupation: Primary school teacher  Social Needs  . Financial resource strain: Not on file  . Food insecurity:    Worry: Not on file    Inability: Not on file  . Transportation needs:    Medical: Not on file    Non-medical: Not on file  Tobacco Use  . Smoking status: Former Smoker    Packs/day: 0.50    Years: 5.00    Pack years: 2.50    Types: Cigarettes    Last attempt to quit: 07/25/1971    Years since quitting: 46.7  . Smokeless tobacco: Never Used  Substance and Sexual Activity  . Alcohol use: Not Currently  . Drug use: No  . 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 Physical activity:    Days per week: Not on file    Minutes per session: Not on file  . Stress: Not on file  Relationships  . Social connections:    Talks on phone: Not on file    Gets together: Not on file    Attends religious service: Not on file    Active member of club or organization: Not on file    Attends meetings of clubs or organizations: Not on file    Relationship status: Not on file  . Intimate partner violence:    Fear of current or ex partner: Not on file    Emotionally abused: Not on file    Physically abused: Not on file    Forced sexual activity: Not on file  Other Topics Concern  . Not on file  Social History Narrative  . Not on file      Family History  Problem Relation Age of Onset  . Lung cancer Mother 57       smoker  . Heart Problems Mother        tachycardia  . Hearing loss Mother   . Anxiety disorder Mother        anxiety, claustrophobia  . Osteoarthritis Father   . Hyperlipidemia Father   . Anuerysm Father        AAA  . Hyperlipidemia Sister   . Migraines Sister   . Hypertension Sister   . Colon cancer Maternal Grandmother   . Heart attack Maternal Grandfather   . Aneurysm Paternal Grandfather        abdominal  . Heart disease Paternal Grandfather        AAA rupture, smoker  . Heart Problems Sister         tachycardia      Review of Systems Painful knees, weight loss and surgery  Constitutional: negative for anorexia, fevers and sweats  Eyes: negative for irritation, redness and visual disturbance  Ears, nose, mouth, throat, and face: negative for earaches, epistaxis, nasal congestion and sore throat  Respiratory: negative for cough, dyspnea on exertion, sputum and wheezing  Cardiovascular: negative for chest pain, dyspnea, lower extremity edema, orthopnea, palpitations and syncope  Gastrointestinal: negative for abdominal pain, constipation, diarrhea, melena, nausea and vomiting  Genitourinary:negative for dysuria, frequency and hematuria  Hematologic/lymphatic: negative for bleeding, easy bruising and lymphadenopathy  Musculoskeletal:negative for  muscle weakness and stiff joints  Neurological: negative for coordination problems, gait problems, headaches and weakness  Endocrine: negative for diabetic symptoms including polydipsia, polyuria and weight loss     Objective:   Physical Exam   Gen. Pleasant, obese, in no distress, normal affect ENT -  no post nasal drip, class 2-3 airway Neck: No JVD, no thyromegaly, no carotid bruits Lungs: no use of accessory muscles, no dullness to percussion, decreased without rales or rhonchi  Cardiovascular: Rhythm regular, heart sounds  normal, no murmurs or gallops, no peripheral edema Abdomen: soft and non-tender, no hepatosplenomegaly, BS normal. Musculoskeletal: No deformities, no cyanosis or clubbing Neuro:  alert, non focal, no tremors        Assessment & Plan:

## 2018-05-03 ENCOUNTER — Other Ambulatory Visit: Payer: Self-pay | Admitting: Family Medicine

## 2018-05-06 ENCOUNTER — Other Ambulatory Visit: Payer: Self-pay | Admitting: Family Medicine

## 2018-05-07 DIAGNOSIS — Z6841 Body Mass Index (BMI) 40.0 and over, adult: Secondary | ICD-10-CM | POA: Diagnosis not present

## 2018-05-07 DIAGNOSIS — I10 Essential (primary) hypertension: Secondary | ICD-10-CM | POA: Diagnosis not present

## 2018-05-08 ENCOUNTER — Telehealth (INDEPENDENT_AMBULATORY_CARE_PROVIDER_SITE_OTHER): Payer: Self-pay

## 2018-05-08 DIAGNOSIS — G4733 Obstructive sleep apnea (adult) (pediatric): Secondary | ICD-10-CM | POA: Diagnosis not present

## 2018-05-08 NOTE — Telephone Encounter (Signed)
Noted.  Will let patient know before I submit.  Thank You.

## 2018-05-08 NOTE — Telephone Encounter (Signed)
Talked with patient and advised her that I received a letter of denial from [facility] for Hyalgan.  Advised patient that her insurance prefers that she try Synvisc series.  Patient would like to know if Dr. Estanislado Pandy thinks that the Synvisc series would help?  Please advise.  Thank You.

## 2018-05-08 NOTE — Telephone Encounter (Signed)
Yes, whatever her insurance will cover will be just fine. Thank you.

## 2018-05-08 NOTE — Telephone Encounter (Signed)
LMOM Synvisc will be fine.

## 2018-05-10 ENCOUNTER — Telehealth (INDEPENDENT_AMBULATORY_CARE_PROVIDER_SITE_OTHER): Payer: Self-pay

## 2018-05-10 NOTE — Telephone Encounter (Signed)
Called and left patient a VM concerning gel injection.  

## 2018-05-10 NOTE — Telephone Encounter (Signed)
Please schedule appointment with Dr. Estanislado Pandy for injection. Thank You.  Patient approved for Synvisc Series, Bilateral Knee Buy & Bill Patient will be responsible for 20% OOP.  May have a Co-pay of $40.00 No PA required  Called and left patient a VM advising her that she would receive a phone call to schedule for Synvisc series and advised her of information above.  Thank You.

## 2018-05-14 ENCOUNTER — Encounter: Payer: Self-pay | Admitting: Family Medicine

## 2018-05-14 ENCOUNTER — Ambulatory Visit (INDEPENDENT_AMBULATORY_CARE_PROVIDER_SITE_OTHER): Payer: Medicare Other | Admitting: Family Medicine

## 2018-05-14 VITALS — BP 132/72 | HR 71 | Temp 98.3°F | Resp 18 | Ht 60.25 in | Wt 258.6 lb

## 2018-05-14 DIAGNOSIS — D72829 Elevated white blood cell count, unspecified: Secondary | ICD-10-CM

## 2018-05-14 DIAGNOSIS — Z23 Encounter for immunization: Secondary | ICD-10-CM | POA: Diagnosis not present

## 2018-05-14 DIAGNOSIS — M170 Bilateral primary osteoarthritis of knee: Secondary | ICD-10-CM

## 2018-05-14 DIAGNOSIS — G4733 Obstructive sleep apnea (adult) (pediatric): Secondary | ICD-10-CM

## 2018-05-14 DIAGNOSIS — E782 Mixed hyperlipidemia: Secondary | ICD-10-CM

## 2018-05-14 DIAGNOSIS — Z79899 Other long term (current) drug therapy: Secondary | ICD-10-CM | POA: Diagnosis not present

## 2018-05-14 DIAGNOSIS — N952 Postmenopausal atrophic vaginitis: Secondary | ICD-10-CM | POA: Insufficient documentation

## 2018-05-14 DIAGNOSIS — R300 Dysuria: Secondary | ICD-10-CM | POA: Diagnosis not present

## 2018-05-14 DIAGNOSIS — Z8639 Personal history of other endocrine, nutritional and metabolic disease: Secondary | ICD-10-CM

## 2018-05-14 DIAGNOSIS — I10 Essential (primary) hypertension: Secondary | ICD-10-CM

## 2018-05-14 LAB — COMPREHENSIVE METABOLIC PANEL
ALT: 14 U/L (ref 0–35)
AST: 13 U/L (ref 0–37)
Albumin: 3.8 g/dL (ref 3.5–5.2)
Alkaline Phosphatase: 96 U/L (ref 39–117)
BILIRUBIN TOTAL: 0.6 mg/dL (ref 0.2–1.2)
BUN: 21 mg/dL (ref 6–23)
CALCIUM: 9.6 mg/dL (ref 8.4–10.5)
CHLORIDE: 102 meq/L (ref 96–112)
CO2: 33 meq/L — AB (ref 19–32)
CREATININE: 0.73 mg/dL (ref 0.40–1.20)
GFR: 84.3 mL/min (ref >60.00)
GLUCOSE: 88 mg/dL (ref 70–99)
Potassium: 4.5 mEq/L (ref 3.5–5.1)
SODIUM: 141 meq/L (ref 135–145)
Total Protein: 6.1 g/dL (ref 6.0–8.3)

## 2018-05-14 LAB — URINALYSIS, ROUTINE W REFLEX MICROSCOPIC
Bilirubin Urine: NEGATIVE
Hgb urine dipstick: NEGATIVE
KETONES UR: NEGATIVE
Nitrite: NEGATIVE
PH: 6.5 (ref 5.0–8.0)
SPECIFIC GRAVITY, URINE: 1.01 (ref 1.000–1.030)
Total Protein, Urine: NEGATIVE
URINE GLUCOSE: NEGATIVE
UROBILINOGEN UA: 0.2 (ref 0.0–1.0)

## 2018-05-14 LAB — CBC
HCT: 41.5 % (ref 36.0–46.0)
Hemoglobin: 14.2 g/dL (ref 12.0–15.0)
MCHC: 34.2 g/dL (ref 30.0–36.0)
MCV: 91.1 fl (ref 78.0–100.0)
PLATELETS: 343 10*3/uL (ref 150.0–400.0)
RBC: 4.56 Mil/uL (ref 3.87–5.11)
RDW: 14.2 % (ref 11.5–15.5)
WBC: 10.1 10*3/uL (ref 4.0–10.5)

## 2018-05-14 LAB — LIPID PANEL
CHOL/HDL RATIO: 3
Cholesterol: 177 mg/dL (ref 0–200)
HDL: 52.6 mg/dL (ref >39.00)
LDL CALC: 89 mg/dL (ref 0–99)
NONHDL: 124.12
Triglycerides: 178 mg/dL — ABNORMAL HIGH (ref 0.0–149.0)
VLDL: 35.6 mg/dL (ref 0.0–40.0)

## 2018-05-14 LAB — TSH: TSH: 1.27 u[IU]/mL (ref 0.35–4.50)

## 2018-05-14 MED ORDER — TRAMADOL HCL 50 MG PO TABS: 50.0000 mg | ORAL_TABLET | Freq: Three times a day (TID) | ORAL | 0 refills | Status: DC | PRN

## 2018-05-14 MED ORDER — ESTROGENS, CONJUGATED 0.625 MG/GM VA CREA: 1.0000 | TOPICAL_CREAM | Freq: Every day | VAGINAL | 3 refills | Status: DC

## 2018-05-14 NOTE — Assessment & Plan Note (Signed)
On Levothyroxine, continue to monitor 

## 2018-05-14 NOTE — Assessment & Plan Note (Signed)
Well controlled, no changes to meds. Encouraged heart healthy diet such as the DASH diet and exercise as tolerated.  °

## 2018-05-14 NOTE — Assessment & Plan Note (Addendum)
Encouraged DASH diet, decrease po intake and increase exercise as tolerated. Needs 7-8 hours of sleep nightly. Avoid trans fats, eat small, frequent meals every 4-5 hours with lean proteins, complex carbs and healthy fats. Minimize simple carbs 

## 2018-05-14 NOTE — Patient Instructions (Addendum)
CoverMyMeds and GoodRx [facility], [facility] and [facility] pharmacy Hypertension Hypertension, commonly called high blood pressure, is when the force of blood pumping through the arteries is too strong. The arteries are the blood vessels that carry blood from the heart throughout the body. Hypertension forces the heart to work harder to pump blood and may cause arteries to become narrow or stiff. Having untreated or uncontrolled hypertension can cause heart attacks, strokes, kidney disease, and other problems. A blood pressure reading consists of a higher number over a lower number. Ideally, your blood pressure should be below 120/80. The first ("top") number is called the systolic pressure. It is a measure of the pressure in your arteries as your heart beats. The second ("bottom") number is called the diastolic pressure. It is a measure of the pressure in your arteries as the heart relaxes. What are the causes? The cause of this condition is not known. What increases the risk? Some risk factors for high blood pressure are under your control. Others are not. Factors you can change  Smoking.  Having type 2 diabetes mellitus, high cholesterol, or both.  Not getting enough exercise or physical activity.  Being overweight.  Having too much fat, sugar, calories, or salt (sodium) in your diet.  Drinking too much alcohol. Factors that are difficult or impossible to change  Having chronic kidney disease.  Having a family history of high blood pressure.  Age. Risk increases with age.  Race. You may be at higher risk if you are African-American.  Gender. Men are at higher risk than women before age 27. After age 2, women are at higher risk than men.  Having obstructive sleep apnea.  Stress. What are the signs or symptoms? Extremely high blood pressure (hypertensive crisis) may cause:  Headache.  Anxiety.  Shortness of breath.  Nosebleed.  Nausea and vomiting.  Severe chest  pain.  Jerky movements you cannot control (seizures).  How is this diagnosed? This condition is diagnosed by measuring your blood pressure while you are seated, with your arm resting on a surface. The cuff of the blood pressure monitor will be placed directly against the skin of your upper arm at the level of your heart. It should be measured at least twice using the same arm. Certain conditions can cause a difference in blood pressure between your right and left arms. Certain factors can cause blood pressure readings to be lower or higher than normal (elevated) for a short period of time:  When your blood pressure is higher when you are in a health care provider's office than when you are at home, this is called white coat hypertension. Most people with this condition do not need medicines.  When your blood pressure is higher at home than when you are in a health care provider's office, this is called masked hypertension. Most people with this condition may need medicines to control blood pressure.  If you have a high blood pressure reading during one visit or you have normal blood pressure with other risk factors:  You may be asked to return on a different day to have your blood pressure checked again.  You may be asked to monitor your blood pressure at home for 1 week or longer.  If you are diagnosed with hypertension, you may have other blood or imaging tests to help your health care provider understand your overall risk for other conditions. How is this treated? This condition is treated by making healthy lifestyle changes, such as eating healthy foods,  exercising more, and reducing your alcohol intake. Your health care provider may prescribe medicine if lifestyle changes are not enough to get your blood pressure under control, and if:  Your systolic blood pressure is above 130.  Your diastolic blood pressure is above 80.  Your personal target blood pressure may vary depending on your  medical conditions, your age, and other factors. Follow these instructions at home: Eating and drinking  Eat a diet that is high in fiber and potassium, and low in sodium, added sugar, and fat. An example eating plan is called the DASH (Dietary Approaches to Stop Hypertension) diet. To eat this way: ? Eat plenty of fresh fruits and vegetables. Try to fill half of your plate at each meal with fruits and vegetables. ? Eat whole grains, such as whole wheat pasta, brown rice, or whole grain bread. Fill about one quarter of your plate with whole grains. ? Eat or drink low-fat dairy products, such as skim milk or low-fat yogurt. ? Avoid fatty cuts of meat, processed or cured meats, and poultry with skin. Fill about one quarter of your plate with lean proteins, such as fish, chicken without skin, beans, eggs, and tofu. ? Avoid premade and processed foods. These tend to be higher in sodium, added sugar, and fat.  Reduce your daily sodium intake. Most people with hypertension should eat less than 1,500 mg of sodium a day.  Limit alcohol intake to no more than 1 drink a day for nonpregnant women and 2 drinks a day for men. One drink equals 12 oz of beer, 5 oz of wine, or 1 oz of hard liquor. Lifestyle  Work with your health care provider to maintain a healthy body weight or to lose weight. Ask what an ideal weight is for you.  Get at least 30 minutes of exercise that causes your heart to beat faster (aerobic exercise) most days of the week. Activities may include walking, swimming, or biking.  Include exercise to strengthen your muscles (resistance exercise), such as pilates or lifting weights, as part of your weekly exercise routine. Try to do these types of exercises for 30 minutes at least 3 days a week.  Do not use any products that contain nicotine or tobacco, such as cigarettes and e-cigarettes. If you need help quitting, ask your health care provider.  Monitor your blood pressure at home as  told by your health care provider.  Keep all follow-up visits as told by your health care provider. This is important. Medicines  Take over-the-counter and prescription medicines only as told by your health care provider. Follow directions carefully. Blood pressure medicines must be taken as prescribed.  Do not skip doses of blood pressure medicine. Doing this puts you at risk for problems and can make the medicine less effective.  Ask your health care provider about side effects or reactions to medicines that you should watch for. Contact a health care provider if:  You think you are having a reaction to a medicine you are taking.  You have headaches that keep coming back (recurring).  You feel dizzy.  You have swelling in your ankles.  You have trouble with your vision. Get help right away if:  You develop a severe headache or confusion.  You have unusual weakness or numbness.  You feel faint.  You have severe pain in your chest or abdomen.  You vomit repeatedly.  You have trouble breathing. Summary  Hypertension is when the force of blood pumping through your arteries  is too strong. If this condition is not controlled, it may put you at risk for serious complications.  Your personal target blood pressure may vary depending on your medical conditions, your age, and other factors. For most people, a normal blood pressure is less than 120/80.  Hypertension is treated with lifestyle changes, medicines, or a combination of both. Lifestyle changes include weight loss, eating a healthy, low-sodium diet, exercising more, and limiting alcohol. This information is not intended to replace advice given to you by your health care provider. Make sure you discuss any questions you have with your health care provider. Document Released: 07/10/2005 Document Revised: 06/07/2016 Document Reviewed: 06/07/2016 Elsevier Interactive Patient Education  [employer].

## 2018-05-14 NOTE — Assessment & Plan Note (Signed)
Repeat elevated WBC today

## 2018-05-14 NOTE — Assessment & Plan Note (Signed)
Has only had her CPAP for a week is struggling with the nasal mask and she is going to talk to pulmonology to discuss the full face mask later today

## 2018-05-14 NOTE — Assessment & Plan Note (Signed)
Has an appointment to have knees evaluated by Dr Maureen Ralphs

## 2018-05-14 NOTE — Assessment & Plan Note (Signed)
Try Premarin cream 2 x a week

## 2018-05-14 NOTE — Assessment & Plan Note (Signed)
Encouraged heart healthy diet, increase exercise, avoid trans fats, consider a krill oil cap daily 

## 2018-05-15 LAB — URINE CULTURE
MICRO NUMBER: 91268810
Result:: NO GROWTH
SPECIMEN QUALITY:: ADEQUATE

## 2018-05-16 LAB — PAIN MGMT, PROFILE 8 W/CONF, U
6 ACETYLMORPHINE: NEGATIVE ng/mL (ref <10)
Alcohol Metabolites: POSITIVE ng/mL — AB (ref <500)
Amphetamines: NEGATIVE ng/mL (ref <500)
BUPRENORPHINE, URINE: NEGATIVE ng/mL (ref <5)
Benzodiazepines: NEGATIVE ng/mL (ref <100)
Cocaine Metabolite: NEGATIVE ng/mL (ref <150)
Creatinine: 111.2 mg/dL
ETHYL SULFATE (ETS): 215 ng/mL — AB (ref <100)
Ethyl Glucuronide (ETG): 698 ng/mL — ABNORMAL HIGH (ref <500)
MDMA: NEGATIVE ng/mL (ref <500)
Marijuana Metabolite: NEGATIVE ng/mL (ref <20)
OPIATES: NEGATIVE ng/mL (ref <100)
Oxidant: NEGATIVE ug/mL (ref <200)
Oxycodone: NEGATIVE ng/mL (ref <100)
pH: 6.77 (ref 4.5–9.0)

## 2018-05-16 NOTE — Telephone Encounter (Signed)
LMOM for patient to call and schedule Synvisc injections °

## 2018-05-19 NOTE — Progress Notes (Signed)
Subjective:    [name], female    DOB: March 23, 1951, 67 y.o.   MRN: [id number]  No chief complaint on file.   HPI Patient is in today for follow up. She feels well today. She has been using her CPAP for about a week and is struggling to adjust but continues to try. She does not like having the mask on her face. No recent febrile illness but is noting some dysuria. No hyematuria. Is trying to hydrate well. Denies CP/palp/SOB/HA/congestion/fevers/GIc/o. Taking meds as prescribed  Past Medical History:  Diagnosis Date  . Abnormal nuclear stress test 08/23/2015  . Arthritis   . Chicken pox as a child  . Essential hypertension 07/17/2011   ACEI d/c 06/2011 for refractory cough> resolved   . GERD (gastroesophageal reflux disease)   . Hyperlipidemia   . Hypertension   . Hypothyroidism   . Insomnia 11/01/2014  . Low back pain radiating to right leg    intermittent and occasional numbness of skin over right hip in certain positions  . Measles as a child  . Mumps as a child  . Obesity   . Pneumonia    this October from which she has said inhailer  . Recurrent epistaxis 02/20/2012  . Rheumatoid arthritis ([place]) 09/10/2011   Bone on bone in knees Pain in L>R knees.   . RLS (restless legs syndrome) 11/01/2014    Past Surgical History:  Procedure Laterality Date  . CARDIAC CATHETERIZATION N/A 08/23/2015   Procedure: Left Heart Cath and Coronary Angiography;  Surgeon: [name], MD;  Location: [facility];  Service: Cardiovascular;  Laterality: N/A;  . CESAREAN SECTION     X 3  . GASTRECTOMY  12/2017  . INGUINAL HERNIA REPAIR Right 67 yrs old  . THYROIDECTOMY     total for benign tumor, Parathyroid spared  . TONSILLECTOMY    . TOTAL HIP ARTHROPLASTY Right 2006   secondary to congenital  hip defect    Family History  Problem Relation Age of Onset  . Lung cancer Mother 65       smoker  . Heart Problems Mother        tachycardia  . Hearing loss Mother   .  Anxiety disorder Mother        anxiety, claustrophobia  . Osteoarthritis Father   . Hyperlipidemia Father   . Anuerysm Father        AAA  . Hyperlipidemia Sister   . Migraines Sister   . Hypertension Sister   . Colon cancer Maternal Grandmother   . Heart attack Maternal Grandfather   . Aneurysm Paternal Grandfather        abdominal  . Heart disease Paternal Grandfather        AAA rupture, smoker  . Heart Problems Sister        tachycardia    Social History   Socioeconomic History  . Marital status: Married    Spouse name: Not on file  . Number of children: 3  . Years of education: Not on file  . Highest education level: Not on file  Occupational History  . Occupation: Training and development officer  . Occupation: Primary school teacher  Social Needs  . Financial resource strain: Not on file  . Food insecurity:    Worry: Not on file    Inability: Not on file  . Transportation needs:    Medical: Not on file    Non-medical: Not on file  Tobacco Use  . Smoking status: Former  Smoker    Packs/day: 0.50    Years: 5.00    Pack years: 2.50    Types: Cigarettes    Last attempt to quit: 07/25/1971    Years since quitting: 46.8  . Smokeless tobacco: Never Used  Substance and Sexual Activity  . Alcohol use: Not Currently  . Drug use: No  . 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 Physical activity:    Days per week: Not on file    Minutes per session: Not on file  . Stress: Not on file  Relationships  . Social connections:    Talks on phone: Not on file    Gets together: Not on file    Attends religious service: Not on file    Active member of club or organization: Not on file    Attends meetings of clubs or organizations: Not on file    Relationship status: Not on file  . Intimate partner violence:    Fear of current or ex partner: Not on file    Emotionally abused: Not on file    Physically abused: Not on file    Forced sexual activity:  Not on file  Other Topics Concern  . Not on file  Social History Narrative  . Not on file    Outpatient Medications Prior to Visit  Medication Sig Dispense Refill  . albuterol (PROAIR HFA) 108 (90 Base) MCG/ACT inhaler Inhale 2 puffs into the lungs every 6 (six) hours as needed for wheezing or shortness of breath. 8.5 Inhaler 6  . aspirin EC 81 MG tablet Take 1 tablet (81 mg total) by mouth daily.    . cetirizine (ZYRTEC) 10 MG tablet TAKE 1 TABLET BY MOUTH DAILY 90 tablet 0  . Cholecalciferol (VITAMIN D3) 1000 units CAPS Take 1 capsule (1,000 Units total) daily by mouth. (Patient taking differently: Take 2 capsules by mouth daily. ) 90 capsule 0  . Cyanocobalamin (VITAMIN B-12) 1000 MCG SUBL Place under the tongue.    . diclofenac sodium (VOLTAREN) 1 % GEL Apply 3 grams to 3 large joints up to 3 times daily 3 Tube 3  . famotidine (PEPCID) 20 MG tablet TAKE 1 TABLET(20 MG) BY MOUTH AT BEDTIME 90 tablet 0  . fluticasone (FLONASE) 50 MCG/ACT nasal spray SHAKE LIQUID AND USE 2 SPRAYS IN EACH NOSTRIL DAILY 48 g 2  . furosemide (LASIX) 20 MG tablet TAKE 2 TABLETS BY MOUTH DAILY FOR 5 DAYS THEN DECREASE TO 1 TABLET BY MOUTH DAILY AS NEEDED 180 tablet 0  . hydrochlorothiazide (HYDRODIURIL) 25 MG tablet TAKE 1 TABLET BY MOUTH DAILY 90 tablet 0  . levothyroxine (SYNTHROID, LEVOTHROID) 100 MCG tablet TAKE 1 TABLET BY MOUTH DAILY BEFORE BREAKFAST 90 tablet 0  . MELATONIN PO Take 1 tablet by mouth at bedtime as needed (sleep).    . metoprolol tartrate (LOPRESSOR) 50 MG tablet TAKE 1 TABLET BY MOUTH TWICE DAILY 180 tablet 0  . nitroGLYCERIN (NITROSTAT) 0.4 MG SL tablet Place 1 tablet (0.4 mg total) under the tongue every 5 (five) minutes as needed for chest pain. 25 tablet 3  . pantoprazole (PROTONIX) 40 MG tablet Take 1 tablet (40 mg total) by mouth daily. 90 tablet 1  . potassium chloride SA (K-DUR,KLOR-CON) 20 MEQ tablet TAKE 1 TABLET BY MOUTH DAILY AS NEEDED(DEPENDING ON LASIX USE) 90 tablet 3  .  rOPINIRole (REQUIP) 0.5 MG tablet Take 1-2 tablets (0.5-1 mg total) by mouth at  bedtime as needed. 180 tablet 0  . simvastatin (ZOCOR) 40 MG tablet TAKE 1 TABLET BY MOUTH EVERY EVENING 90 tablet 0  . ursodiol (ACTIGALL) 250 MG tablet   0  . zolpidem (AMBIEN) 10 MG tablet Take 1 tablet (10 mg total) by mouth at bedtime as needed. for sleep 30 tablet 1  . traMADol (ULTRAM) 50 MG tablet TAKE 1 TO 2 TABLETS BY MOUTH EVERY 8 HOURS AS NEEDED 90 tablet 0   No facility-administered medications prior to visit.     Allergies  Allergen Reactions  . Bee Venom Anaphylaxis  . Accupril [Quinapril Hcl] Cough  . Quinapril   . Quinapril-Hydrochlorothiazide Cough    Review of Systems  Constitutional: Positive for malaise/fatigue. Negative for fever.  HENT: Negative for congestion.   Eyes: Negative for blurred vision.  Respiratory: Negative for shortness of breath.   Cardiovascular: Negative for chest pain, palpitations and leg swelling.  Gastrointestinal: Negative for abdominal pain, blood in stool and nausea.  Genitourinary: Positive for dysuria. Negative for frequency.  Musculoskeletal: Negative for falls.  Skin: Negative for rash.  Neurological: Negative for dizziness, loss of consciousness and headaches.  Endo/Heme/Allergies: Negative for environmental allergies.  Psychiatric/Behavioral: Negative for depression. The patient is not nervous/anxious.        Objective:    Physical Exam  Constitutional: She is oriented to person, place, and time. She appears well-developed and well-nourished. No distress.  HENT:  Head: Normocephalic and atraumatic.  Nose: Nose normal.  Eyes: Right eye exhibits no discharge. Left eye exhibits no discharge.  Neck: Normal range of motion. Neck supple.  Cardiovascular: Normal rate and regular rhythm.  No murmur heard. Pulmonary/Chest: Effort normal and breath sounds normal.  Abdominal: Soft. Bowel sounds are normal. There is no tenderness.  Musculoskeletal: She  exhibits no edema.  Neurological: She is alert and oriented to person, place, and time.  Skin: Skin is warm and dry.  Psychiatric: She has a normal mood and affect.  Nursing note and vitals reviewed.   BP 132/72 (BP Location: Left Arm, Patient Position: Sitting, Cuff Size: Normal)   Pulse 71   Temp 98.3 F (36.8 C) (Oral)   Resp 18   Ht 5' 0.25" (1.53 m)   Wt 258 lb 9.6 oz (117.3 kg)   SpO2 97%   BMI 50.09 kg/m  Wt Readings from Last 3 Encounters:  05/14/18 258 lb 9.6 oz (117.3 kg)  04/26/18 260 lb (117.9 kg)  04/09/18 265 lb 6.4 oz (120.4 kg)     Lab Results  Component Value Date   WBC 10.1 05/14/2018   HGB 14.2 05/14/2018   HCT 41.5 05/14/2018   PLT 343.0 05/14/2018   GLUCOSE 88 05/14/2018   CHOL 177 05/14/2018   TRIG 178.0 (H) 05/14/2018   HDL 52.60 05/14/2018   LDLCALC 89 05/14/2018   ALT 14 05/14/2018   AST 13 05/14/2018   NA 141 05/14/2018   K 4.5 05/14/2018   CL 102 05/14/2018   CREATININE 0.73 05/14/2018   BUN 21 05/14/2018   CO2 33 (H) 05/14/2018   TSH 1.27 05/14/2018   INR 0.99 08/19/2015    Lab Results  Component Value Date   TSH 1.27 05/14/2018   Lab Results  Component Value Date   WBC 10.1 05/14/2018   HGB 14.2 05/14/2018   HCT 41.5 05/14/2018   MCV 91.1 05/14/2018   PLT 343.0 05/14/2018   Lab Results  Component Value Date   NA 141 05/14/2018   K 4.5 05/14/2018  CHLORIDE 103 12/21/2016   CO2 33 (H) 05/14/2018   GLUCOSE 88 05/14/2018   BUN 21 05/14/2018   CREATININE 0.73 05/14/2018   BILITOT 0.6 05/14/2018   ALKPHOS 96 05/14/2018   AST 13 05/14/2018   ALT 14 05/14/2018   PROT 6.1 05/14/2018   ALBUMIN 3.8 05/14/2018   CALCIUM 9.6 05/14/2018   ANIONGAP 12 (H) 12/21/2017   EGFR 73 (L) 12/21/2016   GFR 84.30 05/14/2018   Lab Results  Component Value Date   CHOL 177 05/14/2018   Lab Results  Component Value Date   HDL 52.60 05/14/2018   Lab Results  Component Value Date   LDLCALC 89 05/14/2018   Lab Results  Component  Value Date   TRIG 178.0 (H) 05/14/2018   Lab Results  Component Value Date   CHOLHDL 3 05/14/2018   No results found for: HGBA1C     Assessment & Plan:   Problem List Items Addressed This Visit    Morbid obesity ([place])    Encouraged DASH diet, decrease po intake and increase exercise as tolerated. Needs 7-8 hours of sleep nightly. Avoid trans fats, eat small, frequent meals every 4-5 hours with lean proteins, complex carbs and healthy fats. Minimize simple carbs      Hyperlipidemia    Encouraged heart healthy diet, increase exercise, avoid trans fats, consider a krill oil cap daily      Relevant Orders   Lipid panel (Completed)   Essential hypertension    Well controlled, no changes to meds. Encouraged heart healthy diet such as the DASH diet and exercise as tolerated.       Elevated WBC count    Repeat elevated WBC today      Relevant Orders   CBC (Completed)   Urinalysis   Urine Culture (Completed)   Primary osteoarthritis of both knees    Has an appointment to have knees evaluated by Dr Maureen Ralphs      History of hypothyroidism    On Levothyroxine, continue to monitor      Relevant Orders   TSH (Completed)   OSA (obstructive sleep apnea)    Has only had her CPAP for a week is struggling with the nasal mask and she is going to talk to pulmonology to discuss the full face mask later today      Atrophic vaginitis - Primary    Try Premarin cream 2 x a week      Relevant Orders   CBC (Completed)    Other Visit Diagnoses    Encounter for long-term (current) use of high-risk medication       Relevant Orders   Pain Mgmt, Profile 8 w/Conf, U (Completed)   Dysuria       Relevant Orders   Comprehensive metabolic panel (Completed)   Urinalysis   Urine Culture (Completed)      I have changed Mayah C. Guettler's traMADol. I am also having her start on conjugated estrogens. Additionally, I am having her maintain her aspirin EC, nitroGLYCERIN, MELATONIN PO, albuterol,  potassium chloride SA, Vitamin D3, pantoprazole, Vitamin B-12, zolpidem, furosemide, simvastatin, rOPINIRole, cetirizine, hydrochlorothiazide, famotidine, levothyroxine, fluticasone, diclofenac sodium, metoprolol tartrate, and ursodiol.  Meds ordered this encounter  Medications  . traMADol (ULTRAM) 50 MG tablet    Sig: Take 1-2 tablets (50-100 mg total) by mouth every 8 (eight) hours as needed.    Dispense:  90 tablet    Refill:  0  . conjugated estrogens (PREMARIN) vaginal cream    Sig: Place 1  Applicatorful vaginally daily.    Dispense:  42.5 g    Refill:  3     Penni Homans, MD

## 2018-05-22 ENCOUNTER — Encounter: Payer: Self-pay | Admitting: Rheumatology

## 2018-06-02 ENCOUNTER — Other Ambulatory Visit: Payer: Self-pay | Admitting: Family Medicine

## 2018-06-08 DIAGNOSIS — G4733 Obstructive sleep apnea (adult) (pediatric): Secondary | ICD-10-CM | POA: Diagnosis not present

## 2018-06-18 ENCOUNTER — Ambulatory Visit: Payer: Medicare Other | Admitting: Physician Assistant

## 2018-06-25 ENCOUNTER — Ambulatory Visit: Payer: Medicare Other | Admitting: Physician Assistant

## 2018-06-25 DIAGNOSIS — M170 Bilateral primary osteoarthritis of knee: Secondary | ICD-10-CM

## 2018-06-25 MED ORDER — HYLAN G-F 20 16 MG/2ML IX SOSY
16.0000 mg | PREFILLED_SYRINGE | INTRA_ARTICULAR | Status: AC | PRN
  Administered 2018-06-25: 16 mg via INTRA_ARTICULAR

## 2018-06-25 MED ORDER — LIDOCAINE HCL 1 % IJ SOLN
1.5000 mL | INTRAMUSCULAR | Status: AC | PRN
  Administered 2018-06-25: 1.5 mL

## 2018-06-25 NOTE — Progress Notes (Signed)
   Procedure Note  Patient: Rebecca Brooks             Date of Birth: 1951/04/20           MRN: [id number]             Visit Date: 06/25/2018  Procedures: Visit Diagnoses: Primary osteoarthritis of both knees - Plan: Large Joint Inj: bilateral knee Synvisc #1 bilateral knee joint injections  Large Joint Inj: bilateral knee on 06/25/2018 12:59 PM Indications: pain Details: 27 G 1.5 in needle, medial approach  Arthrogram: No  Medications (Right): 16 mg Hylan 16 MG/2ML; 1.5 mL lidocaine 1 % Aspirate (Right): 0 mL Medications (Left): 16 mg Hylan 16 MG/2ML; 1.5 mL lidocaine 1 % Aspirate (Left): 0 mL Outcome: tolerated well, no immediate complications Procedure, treatment alternatives, risks and benefits explained, specific risks discussed. Consent was given by the patient. Immediately prior to procedure a time out was called to verify the correct patient, procedure, equipment, support staff and site/side marked as required. Patient was prepped and draped in the usual sterile fashion.     Patient tolerated the procedure well.  Hazel Sams, PA-C

## 2018-06-26 ENCOUNTER — Other Ambulatory Visit: Payer: Medicare Other

## 2018-06-26 ENCOUNTER — Ambulatory Visit: Payer: Medicare Other | Admitting: Family

## 2018-07-02 ENCOUNTER — Ambulatory Visit (INDEPENDENT_AMBULATORY_CARE_PROVIDER_SITE_OTHER): Payer: Self-pay

## 2018-07-02 ENCOUNTER — Ambulatory Visit: Payer: Medicare Other | Admitting: Physician Assistant

## 2018-07-02 ENCOUNTER — Other Ambulatory Visit: Payer: Self-pay | Admitting: Family Medicine

## 2018-07-02 ENCOUNTER — Ambulatory Visit (INDEPENDENT_AMBULATORY_CARE_PROVIDER_SITE_OTHER): Payer: Medicare Other | Admitting: Physician Assistant

## 2018-07-02 ENCOUNTER — Encounter: Payer: Self-pay | Admitting: Physician Assistant

## 2018-07-02 VITALS — BP 131/84 | HR 68 | Resp 15 | Ht 62.25 in | Wt 252.7 lb

## 2018-07-02 DIAGNOSIS — M170 Bilateral primary osteoarthritis of knee: Secondary | ICD-10-CM

## 2018-07-02 DIAGNOSIS — G8929 Other chronic pain: Secondary | ICD-10-CM

## 2018-07-02 DIAGNOSIS — R202 Paresthesia of skin: Secondary | ICD-10-CM

## 2018-07-02 DIAGNOSIS — M19012 Primary osteoarthritis, left shoulder: Secondary | ICD-10-CM

## 2018-07-02 DIAGNOSIS — M19041 Primary osteoarthritis, right hand: Secondary | ICD-10-CM

## 2018-07-02 DIAGNOSIS — M19011 Primary osteoarthritis, right shoulder: Secondary | ICD-10-CM

## 2018-07-02 DIAGNOSIS — M51369 Other intervertebral disc degeneration, lumbar region without mention of lumbar back pain or lower extremity pain: Secondary | ICD-10-CM

## 2018-07-02 DIAGNOSIS — M25562 Pain in left knee: Secondary | ICD-10-CM | POA: Diagnosis not present

## 2018-07-02 DIAGNOSIS — Z8639 Personal history of other endocrine, nutritional and metabolic disease: Secondary | ICD-10-CM

## 2018-07-02 DIAGNOSIS — F5101 Primary insomnia: Secondary | ICD-10-CM

## 2018-07-02 DIAGNOSIS — Z8679 Personal history of other diseases of the circulatory system: Secondary | ICD-10-CM

## 2018-07-02 DIAGNOSIS — M25561 Pain in right knee: Secondary | ICD-10-CM | POA: Diagnosis not present

## 2018-07-02 DIAGNOSIS — R600 Localized edema: Secondary | ICD-10-CM

## 2018-07-02 DIAGNOSIS — M5030 Other cervical disc degeneration, unspecified cervical region: Secondary | ICD-10-CM

## 2018-07-02 DIAGNOSIS — Z862 Personal history of diseases of the blood and blood-forming organs and certain disorders involving the immune mechanism: Secondary | ICD-10-CM

## 2018-07-02 DIAGNOSIS — R609 Edema, unspecified: Secondary | ICD-10-CM

## 2018-07-02 DIAGNOSIS — M19042 Primary osteoarthritis, left hand: Secondary | ICD-10-CM

## 2018-07-02 DIAGNOSIS — M5136 Other intervertebral disc degeneration, lumbar region: Secondary | ICD-10-CM

## 2018-07-02 DIAGNOSIS — Z96641 Presence of right artificial hip joint: Secondary | ICD-10-CM

## 2018-07-02 MED ORDER — HYLAN G-F 20 16 MG/2ML IX SOSY
16.0000 mg | PREFILLED_SYRINGE | INTRA_ARTICULAR | Status: AC | PRN
  Administered 2018-07-02: 16 mg via INTRA_ARTICULAR

## 2018-07-02 MED ORDER — LIDOCAINE HCL 1 % IJ SOLN
1.5000 mL | INTRAMUSCULAR | Status: AC | PRN
  Administered 2018-07-02: 1.5 mL

## 2018-07-02 NOTE — Progress Notes (Signed)
Office Visit Note  Patient: Rebecca Brooks             Date of Birth: January 14, 1951           MRN: [id number]             PCP: Mosie Lukes, MD Referring: Mosie Lukes, MD Visit Date: 07/02/2018 Occupation: @GUAROCC @  Subjective:  Bilateral knee pain   History of Present Illness: Rebecca Brooks is a 67 y.o. female with history of osteoarthritis and DDD.  Patient present today for her second Synvisc injection of bilateral knee joints.  She reports that she continues to have pain in bilateral knee joints and will be having a consult with Dr. Reynaldo Minium in the near future.  She states that she would like x-rays of bilateral knees to take with her to her visit with him.  She states she continues to have right [facility] joint pain and wears a right CMC joint brace on a regular basis.  She states that she does not want proceed with surgery of the right [facility] joint at this time.  She states she continues to have symptoms of right carpal tunnel does not proceed with surgical release at this time.  She states she is been evaluated Dr. Fredna Dow in the past.  She does not wear night splint.  Patient but she continues to have chronic lower back pain.  She denies any joint swelling at this time.   Activities of Daily Living:  Patient reports morning stiffness for 30  minutes.   Patient Denies nocturnal pain.  Difficulty dressing/grooming: Denies Difficulty climbing stairs: Reports Difficulty getting out of chair: Reports Difficulty using hands for taps, buttons, cutlery, and/or writing: Reports  Review of Systems  Constitutional: Negative for fatigue.  HENT: Negative for mouth sores, mouth dryness and nose dryness.   Eyes: Negative for pain, visual disturbance and dryness.  Respiratory: Negative for cough, hemoptysis, shortness of breath and difficulty breathing.   Cardiovascular: Negative for chest pain, palpitations, hypertension and swelling in legs/feet.  Gastrointestinal: Negative for blood in stool,  constipation and diarrhea.  Endocrine: Negative for increased urination.  Genitourinary: Negative for painful urination.  Musculoskeletal: Positive for arthralgias, joint pain and morning stiffness. Negative for joint swelling, myalgias, muscle weakness, muscle tenderness and myalgias.  Skin: Negative for color change, pallor, rash, hair loss, nodules/bumps, skin tightness, ulcers and sensitivity to sunlight.  Allergic/Immunologic: Negative for susceptible to infections.  Neurological: Negative for dizziness, numbness, headaches and weakness.  Hematological: Negative for swollen glands.  Psychiatric/Behavioral: Negative for depressed mood and sleep disturbance. The patient is not nervous/anxious.     PMFS History:  Patient Active Problem List   Diagnosis Date Noted  . Atrophic vaginitis 05/14/2018  . OSA (obstructive sleep apnea) 04/26/2018  . Muscle cramps 01/02/2018  . IDA (iron deficiency anemia) 12/22/2016  . Primary osteoarthritis of both knees 10/03/2016  . DDD (degenerative disc disease), cervical 10/03/2016  . DDD (degenerative disc disease), lumbar 10/03/2016  . History of total hip replacement, right 10/03/2016  . History of hypothyroidism 10/03/2016  . History of hypertension 10/03/2016  . History of anemia 10/03/2016  . Chondromalacia 10/03/2016  . Primary osteoarthritis of shoulder 10/03/2016  . Primary osteoarthritis of both hands 10/03/2016  . Abnormal nuclear stress test 08/23/2015  . Globus sensation 08/13/2015  . Special screening for malignant neoplasms, colon 08/13/2015  . Chest pain at rest   . Chest pain with moderate risk of acute coronary syndrome 07/28/2015  .  Annual physical exam 07/11/2015  . RLS (restless legs syndrome) 11/01/2014  . Insomnia 11/01/2014  . Thoracic back pain 01/12/2014  . Allergic rhinitis 11/03/2013  . Preventative health care 12/13/2012  . Recurrent epistaxis 02/20/2012  . Shoulder pain, bilateral 11/01/2011  . Arthralgia of both  knees 10/18/2011  . Peripheral edema 09/10/2011  . Asthma 08/19/2011  . Palpitations 08/01/2011  . Anemia 08/01/2011  . Hypokalemia 08/01/2011  . Elevated WBC count 08/01/2011  . GERD (gastroesophageal reflux disease)   . Low back pain radiating to right leg   . Morbid obesity ([place]) 07/17/2011  . Hypothyroidism 07/17/2011  . Hyperlipidemia 07/17/2011  . Essential hypertension 07/17/2011    Past Medical History:  Diagnosis Date  . Abnormal nuclear stress test 08/23/2015  . Arthritis   . Chicken pox as a child  . Essential hypertension 07/17/2011   ACEI d/c 06/2011 for refractory cough> resolved   . GERD (gastroesophageal reflux disease)   . Hyperlipidemia   . Hypertension   . Hypothyroidism   . Insomnia 11/01/2014  . Low back pain radiating to right leg    intermittent and occasional numbness of skin over right hip in certain positions  . Measles as a child  . Mumps as a child  . Obesity   . Pneumonia    this October from which she has said inhailer  . Recurrent epistaxis 02/20/2012  . Rheumatoid arthritis ([place]) 09/10/2011   Bone on bone in knees Pain in L>R knees.   . RLS (restless legs syndrome) 11/01/2014    Family History  Problem Relation Age of Onset  . Lung cancer Mother 25       smoker  . Heart Problems Mother        tachycardia  . Hearing loss Mother   . Anxiety disorder Mother        anxiety, claustrophobia  . Osteoarthritis Father   . Hyperlipidemia Father   . Anuerysm Father        AAA  . Hyperlipidemia Sister   . Migraines Sister   . Hypertension Sister   . Colon cancer Maternal Grandmother   . Heart attack Maternal Grandfather   . Aneurysm Paternal Grandfather        abdominal  . Heart disease Paternal Grandfather        AAA rupture, smoker  . Heart Problems Sister        tachycardia   Past Surgical History:  Procedure Laterality Date  . CARDIAC CATHETERIZATION N/A 08/23/2015   Procedure: Left Heart Cath and Coronary Angiography;  Surgeon:  [name], MD;  Location: [facility];  Service: Cardiovascular;  Laterality: N/A;  . CESAREAN SECTION     X 3  . GASTRECTOMY  12/2017  . INGUINAL HERNIA REPAIR Right 67 yrs old  . THYROIDECTOMY     total for benign tumor, Parathyroid spared  . TONSILLECTOMY    . TOTAL HIP ARTHROPLASTY Right 2006   secondary to congenital  hip defect   Social History   Social History Narrative  . Not on file    Objective: Vital Signs: BP 131/84 (BP Location: Left Wrist, Patient Position: Sitting, Cuff Size: Normal)   Pulse 68   Resp 15   Ht 5' 2.25" (1.581 m)   Wt 252 lb 11.2 oz (114.6 kg)   BMI 45.85 kg/m    Physical Exam  Constitutional: She is oriented to person, place, and time. She appears well-developed and well-nourished.  HENT:  Head: Normocephalic and atraumatic.  Eyes: Conjunctivae and EOM are normal.  Neck: Normal range of motion.  Cardiovascular: Normal rate, regular rhythm, normal heart sounds and intact distal pulses.  Pulmonary/Chest: Effort normal and breath sounds normal.  Abdominal: Soft. Bowel sounds are normal.  Lymphadenopathy:    She has no cervical adenopathy.  Neurological: She is alert and oriented to person, place, and time.  Skin: Skin is warm and dry. Capillary refill takes less than 2 seconds.  Psychiatric: She has a normal mood and affect. Her behavior is normal.  Nursing note and vitals reviewed.    Musculoskeletal Exam: C-spine limited ROM.  Thoracic and lumbar spine good ROM.  no midline spinal tenderness.  No SI joint tenderness. Shoulder abduction to 120 degrees bilaterally.  Elbow joints, wrist joints, MCPs, PIPs, and DIPs good ROM with no synovitis.  Severe PIP and DIP synovial thickening and subluxation.  Synovial thickening in bilateral CMC joints.  She has right CMC joint tenderness.  Right hip limited range of motion.  Knee joints good range of motion with no warmth or effusion.  No tenderness or swelling of ankle joints.  She has pedal  edema bilaterally.  CDAI Exam: CDAI Score: Not documented Patient Global Assessment: Not documented; Provider Global Assessment: Not documented Swollen: Not documented; Tender: Not documented Joint Exam   Not documented   There is currently no information documented on the homunculus. Go to the Rheumatology activity and complete the homunculus joint exam.  Investigation: No additional findings.  Imaging: Xr Knee 3 View Left  Result Date: 07/02/2018 Severe lateral compartment narrowing with lateral osteophytes was noted.  No chondrocalcinosis was noted.  Severe patellofemoral narrowing was noted. Impression: Severe end-stage osteoarthritis of the knee joint and severe chondromalacia patella.  Xr Knee 3 View Right  Result Date: 07/02/2018 Severe medial compartment narrowing and lateral osteophytes were noted.  No chondrocalcinosis was noted.  Severe patellofemoral narrowing was noted. Impression: Severe end-stage osteoarthritis and severe chondromalacia patella of the knee joint.   Recent Labs: Lab Results  Component Value Date   WBC 10.1 05/14/2018   HGB 14.2 05/14/2018   PLT 343.0 05/14/2018   NA 141 05/14/2018   K 4.5 05/14/2018   CL 102 05/14/2018   CO2 33 (H) 05/14/2018   GLUCOSE 88 05/14/2018   BUN 21 05/14/2018   CREATININE 0.73 05/14/2018   BILITOT 0.6 05/14/2018   ALKPHOS 96 05/14/2018   AST 13 05/14/2018   ALT 14 05/14/2018   PROT 6.1 05/14/2018   ALBUMIN 3.8 05/14/2018   CALCIUM 9.6 05/14/2018   GFRAA >60 12/21/2017    Speciality Comments: No specialty comments available.  Procedures:  No procedures performed Allergies: Bee venom; Accupril [quinapril hcl]; Quinapril; and Quinapril-hydrochlorothiazide   Assessment / Plan:     Visit Diagnoses: Primary osteoarthritis of both knees -she has chronic pain in bilateral knee joints.  X-rays were obtained today that revealed severe osteoarthritis and severe chondromalacia patella.  She has been using Voltaren  gel topically PRN.  She will be following up with Dr. Wynelle Link to discuss knee replacements in the future.  She has been trying to lose weight to get her BMI below 40.  She will be completing the series of Synvisc bilateral knee injections next week.  Plan: Large Joint Inj: bilateral knee, XR KNEE 3 VIEW LEFT, XR KNEE 3 VIEW RIGHT  Chronic pain of both knees -she has chronic pain in bilateral knee joints.  No warmth or effusion noted.  She has good range of motion on exam.  She presented today for her second Synvisc injection of bilateral knee joints.  She tolerated procedure well.  Procedure note was completed.  She requested x-rays of both knees and just to take with her since she will be seeing Dr. Wynelle Link to discuss knee replacements in the future.  X-rays of both knees reveal severe osteoarthritis and severe chondromalacia patella.  She has been trying to lose weight to get her BMI below 40.  She is about 25 more pounds to lose.  She will follow-up next week to have her third Synvisc injection of bilateral knee joints.  She continues to use Voltaren gel topically for pain relief.  Plan: XR KNEE 3 VIEW LEFT, XR KNEE 3 VIEW RIGHT  Primary osteoarthritis of both hands: She has PIP and DIP synovial thickening consistent with osteoarthritis of bilateral hands.  She has severe osteoarthritis in both hands.  She has been having increased right CMC joint pain and wears a right CMC joint brace on a regular basis.  She has been evaluated by Dr. Fredna Dow but does not want to proceed with a right [facility] joint replacement.  Joint protection and muscle strengthening were discussed.  Primary osteoarthritis of both shoulders: She has painful range of motion of bilateral shoulder joints.  She has limited abduction to about 120 degrees bilaterally.  History of total hip replacement, right: She is slightly limited range of motion with discomfort.  DDD (degenerative disc disease), cervical: She has good ROM on exam.  She has no  symptons  DDD (degenerative disc disease), lumbar: Chronic pain.  She has limited ROM on exam with discomfort.   Right hand paresthesia - She is not ready to proceed with carpal tunnel release.  She has been evaluated by Dr. Fredna Dow. She was given a prescription for right carpal tunnel night splint.  Primary insomnia: She takes Ambien 10 mg by mouth at bedtime.  Other medical conditions are listed as follows:  History of high cholesterol  Peripheral edema  History of hypothyroidism  History of hypertension  History of anemia   Orders: Orders Placed This Encounter  Procedures  . Large Joint Inj: bilateral knee  . XR KNEE 3 VIEW LEFT  . XR KNEE 3 VIEW RIGHT   No orders of the defined types were placed in this encounter.     Follow-Up Instructions: Return in about 6 months (around 01/01/2019) for Osteoarthritis.   Ofilia Neas, PA-C  Note - This record has been created using Dragon software.  Chart creation errors have been sought, but may not always  have been located. Such creation errors do not reflect on  the standard of medical care.

## 2018-07-02 NOTE — Progress Notes (Signed)
   Procedure Note  Patient: Rebecca Brooks             Date of Birth: 04/09/1951           MRN: [id number]             Visit Date: 07/02/2018  Procedures: Visit Diagnoses: Primary osteoarthritis of both knees - Plan: Large Joint Inj: bilateral knee Synvisc #2 bilateral knee joint injections  Large Joint Inj: bilateral knee on 07/02/2018 1:21 PM Indications: pain Details: 27 G 1.5 in needle, medial approach  Arthrogram: No  Medications (Right): 16 mg Hylan 16 MG/2ML; 1.5 mL lidocaine 1 % Aspirate (Right): 0 mL Medications (Left): 16 mg Hylan 16 MG/2ML; 1.5 mL lidocaine 1 % Aspirate (Left): 0 mL Outcome: tolerated well, no immediate complications Procedure, treatment alternatives, risks and benefits explained, specific risks discussed. Consent was given by the patient. Immediately prior to procedure a time out was called to verify the correct patient, procedure, equipment, support staff and site/side marked as required. Patient was prepped and draped in the usual sterile fashion.     Patient tolerated the procedure well.  Hazel Sams, PA-C

## 2018-07-02 NOTE — Telephone Encounter (Signed)
Called and spoke with patient. She has been rescheduled for 08/14/17 at 130pm. Nothing further needed at time of call.

## 2018-07-03 ENCOUNTER — Ambulatory Visit: Payer: Medicare Other | Admitting: Pulmonary Disease

## 2018-07-04 ENCOUNTER — Ambulatory Visit: Payer: Medicare Other | Admitting: Family

## 2018-07-04 ENCOUNTER — Other Ambulatory Visit: Payer: Medicare Other

## 2018-07-08 DIAGNOSIS — G4733 Obstructive sleep apnea (adult) (pediatric): Secondary | ICD-10-CM | POA: Diagnosis not present

## 2018-07-09 ENCOUNTER — Ambulatory Visit: Payer: Medicare Other | Admitting: Physician Assistant

## 2018-07-09 DIAGNOSIS — M170 Bilateral primary osteoarthritis of knee: Secondary | ICD-10-CM

## 2018-07-09 MED ORDER — HYLAN G-F 20 16 MG/2ML IX SOSY
16.0000 mg | PREFILLED_SYRINGE | INTRA_ARTICULAR | Status: AC | PRN
  Administered 2018-07-09: 16 mg via INTRA_ARTICULAR

## 2018-07-09 MED ORDER — LIDOCAINE HCL 1 % IJ SOLN
1.5000 mL | INTRAMUSCULAR | Status: AC | PRN
  Administered 2018-07-09: 1.5 mL

## 2018-07-09 NOTE — Progress Notes (Signed)
   Procedure Note  Patient: Rebecca Brooks             Date of Birth: 1950-11-16           MRN: [id number]             Visit Date: 07/09/2018  Procedures: Visit Diagnoses: Primary osteoarthritis of both knees - Plan: Large Joint Inj: bilateral knee Synvisc #3 bilateral knee joint injections  Large Joint Inj: bilateral knee on 07/09/2018 11:54 AM Indications: pain Details: 27 G 1.5 in needle, medial approach  Arthrogram: No  Medications (Right): 1.5 mL lidocaine 1 %; 16 mg Hylan 16 MG/2ML Aspirate (Right): 0 mL Medications (Left): 1.5 mL lidocaine 1 %; 16 mg Hylan 16 MG/2ML Aspirate (Left): 0 mL Outcome: tolerated well, no immediate complications Procedure, treatment alternatives, risks and benefits explained, specific risks discussed. Consent was given by the patient. Immediately prior to procedure a time out was called to verify the correct patient, procedure, equipment, support staff and site/side marked as required. Patient was prepped and draped in the usual sterile fashion.     Patient tolerating the procedure well.  Hazel Sams, PA-C

## 2018-07-30 ENCOUNTER — Other Ambulatory Visit: Payer: Self-pay | Admitting: Family Medicine

## 2018-08-02 ENCOUNTER — Inpatient Hospital Stay (HOSPITAL_BASED_OUTPATIENT_CLINIC_OR_DEPARTMENT_OTHER): Payer: Medicare Other | Admitting: Family

## 2018-08-02 ENCOUNTER — Inpatient Hospital Stay: Payer: Medicare Other | Attending: Family

## 2018-08-02 DIAGNOSIS — D72828 Other elevated white blood cell count: Secondary | ICD-10-CM | POA: Diagnosis not present

## 2018-08-02 DIAGNOSIS — D508 Other iron deficiency anemias: Secondary | ICD-10-CM

## 2018-08-02 DIAGNOSIS — Z79899 Other long term (current) drug therapy: Secondary | ICD-10-CM

## 2018-08-02 DIAGNOSIS — Z7982 Long term (current) use of aspirin: Secondary | ICD-10-CM

## 2018-08-02 DIAGNOSIS — D509 Iron deficiency anemia, unspecified: Secondary | ICD-10-CM | POA: Insufficient documentation

## 2018-08-02 DIAGNOSIS — D72829 Elevated white blood cell count, unspecified: Secondary | ICD-10-CM | POA: Diagnosis not present

## 2018-08-02 DIAGNOSIS — D473 Essential (hemorrhagic) thrombocythemia: Secondary | ICD-10-CM

## 2018-08-02 DIAGNOSIS — D75839 Thrombocytosis, unspecified: Secondary | ICD-10-CM

## 2018-08-02 LAB — CBC WITH DIFFERENTIAL (CANCER CENTER ONLY)
ABS IMMATURE GRANULOCYTES: 0.03 10*3/uL (ref 0.00–0.07)
Basophils Absolute: 0.1 10*3/uL (ref 0.0–0.1)
Basophils Relative: 1 %
Eosinophils Absolute: 0.2 10*3/uL (ref 0.0–0.5)
Eosinophils Relative: 2 %
HCT: 44.9 % (ref 36.0–46.0)
Hemoglobin: 14.7 g/dL (ref 12.0–15.0)
Immature Granulocytes: 0 %
Lymphocytes Relative: 18 %
Lymphs Abs: 2.1 10*3/uL (ref 0.7–4.0)
MCH: 31.1 pg (ref 26.0–34.0)
MCHC: 32.7 g/dL (ref 30.0–36.0)
MCV: 95.1 fL (ref 80.0–100.0)
Monocytes Absolute: 0.6 10*3/uL (ref 0.1–1.0)
Monocytes Relative: 5 %
NRBC: 0 % (ref 0.0–0.2)
Neutro Abs: 8.7 10*3/uL — ABNORMAL HIGH (ref 1.7–7.7)
Neutrophils Relative %: 74 %
Platelet Count: 360 10*3/uL (ref 150–400)
RBC: 4.72 MIL/uL (ref 3.87–5.11)
RDW: 13 % (ref 11.5–15.5)
WBC Count: 11.7 10*3/uL — ABNORMAL HIGH (ref 4.0–10.5)

## 2018-08-02 LAB — RETICULOCYTES
Immature Retic Fract: 10.7 % (ref 2.3–15.9)
RBC.: 4.72 MIL/uL (ref 3.87–5.11)
Retic Count, Absolute: 94.9 10*3/uL (ref 19.0–186.0)
Retic Ct Pct: 2 % (ref 0.4–3.1)

## 2018-08-02 NOTE — Progress Notes (Signed)
Hematology and Oncology Follow Up Visit  Rebecca Brooks [id number] 1951-06-05 68 y.o. 08/02/2018   Principle Diagnosis:  Reactive leukocytosis Iron deficiency anemia  Thrombocytosis  Current Therapy:   IV iron as indicated    Interim History:  Rebecca Brooks is here today for follow-up. She is doing well but has noted fatigue, chills and trouble sleeping.  No episodes of bleeding, no bruising or petechiae.  No issues with infection. No fever, n/v, cough, rash, dizziness, SOB, chest pain, palpitations, abdominal pain or changes in bowel habits.  She has had some urinary incontinence but does not think she has a UTI. She states that her PCP felt it may be bladder spasms brought on by dehydration.  No swelling, tenderness, numbness or tingling in her extremities.  No lymphadenopathy noted on exam.  She has maintained a good appetite and is making an effort to stay well hydrated. Her weight is stable.   ECOG Performance Status: 1 - Symptomatic but completely ambulatory  Medications:  Allergies as of 08/02/2018      Reactions   Bee Venom Anaphylaxis   Accupril [quinapril Hcl] Cough   Quinapril    Quinapril-hydrochlorothiazide Cough      Medication List       Accurate as of August 02, 2018  1:55 PM. Always use your most recent med list.        albuterol 108 (90 Base) MCG/ACT inhaler Commonly known as:  PROAIR HFA Inhale 2 puffs into the lungs every 6 (six) hours as needed for wheezing or shortness of breath.   aspirin EC 81 MG tablet Take 1 tablet (81 mg total) by mouth daily.   cetirizine 10 MG tablet Commonly known as:  ZYRTEC TAKE 1 TABLET BY MOUTH DAILY   conjugated estrogens vaginal cream Commonly known as:  PREMARIN Place 1 Applicatorful vaginally daily.   diclofenac sodium 1 % Gel Commonly known as:  VOLTAREN Apply 3 grams to 3 large joints up to 3 times daily   famotidine 20 MG tablet Commonly known as:  PEPCID TAKE 1 TABLET(20 MG) BY MOUTH AT BEDTIME     fluticasone 50 MCG/ACT nasal spray Commonly known as:  FLONASE SHAKE LIQUID AND USE 2 SPRAYS IN EACH NOSTRIL DAILY   furosemide 20 MG tablet Commonly known as:  LASIX TAKE 2 TABLETS BY MOUTH DAILY FOR 5 DAYS THEN DECREASE TO 1 TABLET BY MOUTH DAILY AS NEEDED   hydrochlorothiazide 25 MG tablet Commonly known as:  HYDRODIURIL TAKE 1 TABLET BY MOUTH DAILY   levothyroxine 100 MCG tablet Commonly known as:  SYNTHROID, LEVOTHROID TAKE 1 TABLET BY MOUTH DAILY BEFORE BREAKFAST   MELATONIN PO Take 1 tablet by mouth at bedtime as needed (sleep).   metoprolol tartrate 50 MG tablet Commonly known as:  LOPRESSOR TAKE 1 TABLET BY MOUTH TWICE DAILY   nitroGLYCERIN 0.4 MG SL tablet Commonly known as:  NITROSTAT Place 1 tablet (0.4 mg total) under the tongue every 5 (five) minutes as needed for chest pain.   pantoprazole 40 MG tablet Commonly known as:  PROTONIX Take 1 tablet (40 mg total) by mouth daily.   potassium chloride SA 20 MEQ tablet Commonly known as:  K-DUR,KLOR-CON TAKE 1 TABLET BY MOUTH DAILY AS NEEDED(DEPENDING ON LASIX USE)   rOPINIRole 0.5 MG tablet Commonly known as:  REQUIP TAKE 1 TO 2 TABLETS BY MOUTH AT BEDTIME IF NEEDED   SENNA PO Take by mouth daily.   simvastatin 40 MG tablet Commonly known as:  ZOCOR TAKE 1  TABLET BY MOUTH EVERY EVENING   traMADol 50 MG tablet Commonly known as:  ULTRAM Take 1-2 tablets (50-100 mg total) by mouth every 8 (eight) hours as needed.   ursodiol 250 MG tablet Commonly known as:  ACTIGALL   Vitamin B-12 1000 MCG Subl Place under the tongue.   Vitamin D3 25 MCG (1000 UT) Caps Take 1 capsule (1,000 Units total) daily by mouth.   zolpidem 10 MG tablet Commonly known as:  AMBIEN Take 1 tablet (10 mg total) by mouth at bedtime as needed. for sleep       Allergies:  Allergies  Allergen Reactions  . Bee Venom Anaphylaxis  . Accupril [Quinapril Hcl] Cough  . Quinapril   . Quinapril-Hydrochlorothiazide Cough    Past  Medical History, Surgical history, Social history, and Family History were reviewed and updated.  Review of Systems: All other 10 point review of systems is negative.   Physical Exam:  vitals were not taken for this visit.   Wt Readings from Last 3 Encounters:  07/02/18 252 lb 11.2 oz (114.6 kg)  05/14/18 258 lb 9.6 oz (117.3 kg)  04/26/18 260 lb (117.9 kg)    Ocular: Sclerae unicteric, pupils equal, round and reactive to light Ear-nose-throat: Oropharynx clear, dentition fair Lymphatic: No cervical, supraclavicular or axillary adenopathy Lungs no rales or rhonchi, good excursion bilaterally Heart regular rate and rhythm, no murmur appreciated Abd soft, nontender, positive bowel sounds, no liver or spleen tip palpated on exam, no fluid wave  MSK no focal spinal tenderness, no joint edema Neuro: non-focal, well-oriented, appropriate affect Breasts: Deferred  Lab Results  Component Value Date   WBC 10.1 05/14/2018   HGB 14.2 05/14/2018   HCT 41.5 05/14/2018   MCV 91.1 05/14/2018   PLT 343.0 05/14/2018   Lab Results  Component Value Date   FERRITIN 469 (H) 04/04/2018   IRON 80 04/04/2018   TIBC 286 04/04/2018   UIBC 206 04/04/2018   IRONPCTSAT 28 04/04/2018   Lab Results  Component Value Date   RETICCTPCT 2.0 04/04/2018   RBC 4.56 05/14/2018   No results found for: KPAFRELGTCHN, LAMBDASER, KAPLAMBRATIO No results found for: IGGSERUM, IGA, IGMSERUM No results found for: Kathrynn Ducking, MSPIKE, SPEI   Chemistry      Component Value Date/Time   NA 141 05/14/2018 1424   NA 143 06/22/2017 1311   NA 142 12/21/2016 0922   K 4.5 05/14/2018 1424   K 3.1 (L) 06/22/2017 1311   K 3.5 12/21/2016 0922   CL 102 05/14/2018 1424   CL 99 06/22/2017 1311   CO2 33 (H) 05/14/2018 1424   CO2 32 06/22/2017 1311   CO2 30 (H) 12/21/2016 0922   BUN 21 05/14/2018 1424   BUN 21 06/22/2017 1311   BUN 22.4 12/21/2016 0922   CREATININE 0.73  05/14/2018 1424   CREATININE 0.76 12/21/2017 1327   CREATININE 0.9 06/22/2017 1311   CREATININE 0.8 12/21/2016 0922      Component Value Date/Time   CALCIUM 9.6 05/14/2018 1424   CALCIUM 8.9 06/22/2017 1311   CALCIUM 9.2 12/21/2016 0922   ALKPHOS 96 05/14/2018 1424   ALKPHOS 99 (H) 06/22/2017 1311   ALKPHOS 114 12/21/2016 0922   AST 13 05/14/2018 1424   AST 16 12/21/2017 1327   AST 15 12/21/2016 0922   ALT 14 05/14/2018 1424   ALT 17 12/21/2017 1327   ALT 19 06/22/2017 1311   ALT 13 12/21/2016 0922   BILITOT 0.6  05/14/2018 1424   BILITOT 0.5 12/21/2017 1327   BILITOT 0.29 12/21/2016 1157       Impression and Plan: Rebecca Brooks is a very pleasant 68 yo caucasian female with mild reactive leukocytosis with arthritis and iron deficiency secondary to malabsorption.  We will see what her iron studies show and bring her in for infusion if needed. We will see her ion another 4 months.  She will contact our office with any questions or concerns. We can certainly see her sooner if need be.   Laverna Peace, NP 1/10/20201:55 PM

## 2018-08-05 LAB — IRON AND TIBC
Iron: 69 ug/dL (ref 41–142)
Saturation Ratios: 22 % (ref 21–57)
TIBC: 315 ug/dL (ref 236–444)
UIBC: 246 ug/dL (ref 120–384)

## 2018-08-05 LAB — FERRITIN: Ferritin: 344 ng/mL — ABNORMAL HIGH (ref 11–307)

## 2018-08-07 ENCOUNTER — Encounter: Payer: Self-pay | Admitting: Family

## 2018-08-08 DIAGNOSIS — G4733 Obstructive sleep apnea (adult) (pediatric): Secondary | ICD-10-CM | POA: Diagnosis not present

## 2018-08-13 DIAGNOSIS — E8881 Metabolic syndrome: Secondary | ICD-10-CM | POA: Diagnosis not present

## 2018-08-13 DIAGNOSIS — R635 Abnormal weight gain: Secondary | ICD-10-CM | POA: Diagnosis not present

## 2018-08-13 DIAGNOSIS — I10 Essential (primary) hypertension: Secondary | ICD-10-CM | POA: Diagnosis not present

## 2018-08-13 DIAGNOSIS — Z9884 Bariatric surgery status: Secondary | ICD-10-CM | POA: Diagnosis not present

## 2018-08-14 ENCOUNTER — Ambulatory Visit: Payer: Medicare Other | Admitting: Pulmonary Disease

## 2018-08-14 ENCOUNTER — Encounter: Payer: Self-pay | Admitting: Pulmonary Disease

## 2018-08-14 DIAGNOSIS — J4520 Mild intermittent asthma, uncomplicated: Secondary | ICD-10-CM | POA: Diagnosis not present

## 2018-08-14 DIAGNOSIS — G4733 Obstructive sleep apnea (adult) (pediatric): Secondary | ICD-10-CM | POA: Diagnosis not present

## 2018-08-14 NOTE — Assessment & Plan Note (Signed)
Stable

## 2018-08-14 NOTE — Assessment & Plan Note (Signed)
CPAP is working well on current auto settings.  Although few residual events are still present, she is continuing to lose weight which means that she may need lesser pressure and so we will continue current settings auto 5 to 12 cm. Weight loss encouraged, compliance with goal of at least 4-6 hrs every night is the expectation. Advised against medications with sedative side effects Cautioned against driving when sleepy - understanding that sleepiness will vary on a day to day basis  We discussed care of the machine

## 2018-08-14 NOTE — Progress Notes (Signed)
   Subjective:    [name], female    DOB: 1951-01-22, 68 y.o.   MRN: [id number]  HPI   68 year old remote smoker for FU of OSA . She has been seen in the past for chronic cough and community-acquired pneumonia  She underwent gastric sleeve surgery 12/2017 at [facility]  She was placed on CPAP after her last visit, auto settings 5 to 12 cm and has done well with this.  She had a large mouth leak with nasal pillows and settled on a 30 fullface mask which she has adjusted well to. She still feels that this is a nuisance but does/much better, insomnia is resolved, wakes up feeling rested and does not have sleep pressure in the daytime. CPAP download was reviewed which shows excellent compliance more than 7 hours per night, average pressure of 12 cm with residual AHI of 6/hour and minimal leak  She has lost significant weight to her current weight of 247 pounds, about 20 pounds  Significant tests/ events reviewed  NPSG 02/2018 >> AHI 27/h, wt 271 lbs  PFT's 09/2011 FEV1 2.04 (95%) ratio 78 and ERV 62 with DLCO 46%  Review of Systems Patient denies significant dyspnea,cough, hemoptysis,  chest pain, palpitations, pedal edema, orthopnea, paroxysmal nocturnal dyspnea, lightheadedness, nausea, vomiting, abdominal or  leg pains      Objective:   Physical Exam  Gen. Pleasant, obese, in no distress ENT - no lesions, no post nasal drip Neck: No JVD, no thyromegaly, no carotid bruits Lungs: no use of accessory muscles, no dullness to percussion, decreased without rales or rhonchi  Cardiovascular: Rhythm regular, heart sounds  normal, no murmurs or gallops, no peripheral edema Musculoskeletal: No deformities, no cyanosis or clubbing , no tremors       Assessment & Plan:

## 2018-08-14 NOTE — Patient Instructions (Signed)
CPAP is working well on current auto settings.  Congratulations on your weight loss

## 2018-08-15 DIAGNOSIS — G4733 Obstructive sleep apnea (adult) (pediatric): Secondary | ICD-10-CM | POA: Diagnosis not present

## 2018-09-06 ENCOUNTER — Other Ambulatory Visit: Payer: Self-pay | Admitting: Family Medicine

## 2018-09-11 NOTE — Telephone Encounter (Signed)
Requesting:tramadol Contract:yes UDS:low risk nest screen 11/16/18 Last OV:05/14/18 Next OV:09/17/18 Last Refill:05/14/18  #[id number] Database:   Please advise

## 2018-09-17 ENCOUNTER — Encounter: Payer: Medicare Other | Admitting: Family Medicine

## 2018-09-23 DIAGNOSIS — F4329 Adjustment disorder with other symptoms: Secondary | ICD-10-CM | POA: Diagnosis not present

## 2018-09-26 NOTE — Progress Notes (Deleted)
Office Visit Note  Patient: Rebecca Brooks             Date of Birth: [birth year]           MRN: [id number]             PCP: Mosie Lukes, MD Referring: Mosie Lukes, MD Visit Date: 10/10/2018 Occupation: @GUAROCC @  Subjective:  No chief complaint on file.   History of Present Illness: Rebecca Brooks is a 68 y.o. female ***   Activities of Daily Living:  Patient reports morning stiffness for *** {minute/hour:19697}.   Patient {ACTIONS;DENIES/REPORTS:21021675::"Denies"} nocturnal pain.  Difficulty dressing/grooming: {ACTIONS;DENIES/REPORTS:21021675::"Denies"} Difficulty climbing stairs: {ACTIONS;DENIES/REPORTS:21021675::"Denies"} Difficulty getting out of chair: {ACTIONS;DENIES/REPORTS:21021675::"Denies"} Difficulty using hands for taps, buttons, cutlery, and/or writing: {ACTIONS;DENIES/REPORTS:21021675::"Denies"}  No Rheumatology ROS completed.   PMFS History:  Patient Active Problem List   Diagnosis Date Noted  . Atrophic vaginitis 05/14/2018  . OSA (obstructive sleep apnea) 04/26/2018  . Muscle cramps 01/02/2018  . IDA (iron deficiency anemia) 12/22/2016  . Primary osteoarthritis of both knees 10/03/2016  . DDD (degenerative disc disease), cervical 10/03/2016  . DDD (degenerative disc disease), lumbar 10/03/2016  . History of total hip replacement, right 10/03/2016  . History of hypothyroidism 10/03/2016  . History of hypertension 10/03/2016  . History of anemia 10/03/2016  . Chondromalacia 10/03/2016  . Primary osteoarthritis of shoulder 10/03/2016  . Primary osteoarthritis of both hands 10/03/2016  . Abnormal nuclear stress test 08/23/2015  . Globus sensation 08/13/2015  . Special screening for malignant neoplasms, colon 08/13/2015  . Chest pain at rest   . Chest pain with moderate risk of acute coronary syndrome 07/28/2015  . Annual physical exam 07/11/2015  . RLS (restless legs syndrome) 11/01/2014  . Insomnia 11/01/2014  . Thoracic back pain 01/12/2014   . Allergic rhinitis 11/03/2013  . Preventative health care 12/13/2012  . Recurrent epistaxis 02/20/2012  . Shoulder pain, bilateral 11/01/2011  . Arthralgia of both knees 10/18/2011  . Peripheral edema 09/10/2011  . Asthma 08/19/2011  . Palpitations 08/01/2011  . Anemia 08/01/2011  . Hypokalemia 08/01/2011  . Elevated WBC count 08/01/2011  . GERD (gastroesophageal reflux disease)   . Low back pain radiating to right leg   . Morbid obesity ([place]) 07/17/2011  . Hypothyroidism 07/17/2011  . Hyperlipidemia 07/17/2011  . Essential hypertension 07/17/2011    Past Medical History:  Diagnosis Date  . Abnormal nuclear stress test 08/23/2015  . Arthritis   . Chicken pox as a child  . Essential hypertension 07/17/2011   ACEI d/c 06/2011 for refractory cough> resolved   . GERD (gastroesophageal reflux disease)   . Hyperlipidemia   . Hypertension   . Hypothyroidism   . Insomnia 11/01/2014  . Low back pain radiating to right leg    intermittent and occasional numbness of skin over right hip in certain positions  . Measles as a child  . Mumps as a child  . Obesity   . Pneumonia    this October from which she has said inhailer  . Recurrent epistaxis 02/20/2012  . Rheumatoid arthritis ([place]) 09/10/2011   Bone on bone in knees Pain in L>R knees.   . RLS (restless legs syndrome) 11/01/2014    Family History  Problem Relation Age of Onset  . Lung cancer Mother 44       smoker  . Heart Problems Mother        tachycardia  . Hearing loss Mother   . Anxiety disorder Mother  anxiety, claustrophobia  . Osteoarthritis Father   . Hyperlipidemia Father   . Anuerysm Father        AAA  . Hyperlipidemia Sister   . Migraines Sister   . Hypertension Sister   . Colon cancer Maternal Grandmother   . Heart attack Maternal Grandfather   . Aneurysm Paternal Grandfather        abdominal  . Heart disease Paternal Grandfather        AAA rupture, smoker  . Heart Problems Sister         tachycardia   Past Surgical History:  Procedure Laterality Date  . CARDIAC CATHETERIZATION N/A 08/23/2015   Procedure: Left Heart Cath and Coronary Angiography;  Surgeon: [name], MD;  Location: [place] [facility];  Service: Cardiovascular;  Laterality: N/A;  . CESAREAN SECTION     X 3  . GASTRECTOMY  12/2017  . INGUINAL HERNIA REPAIR Right 68 yrs old  . THYROIDECTOMY     total for benign tumor, Parathyroid spared  . TONSILLECTOMY    . TOTAL HIP ARTHROPLASTY Right 2006   secondary to congenital  hip defect   Social History   Social History Narrative  . Not on file   Immunization History  Administered Date(s) Administered  . Influenza Split 07/19/2012  . Influenza Whole 06/24/2011, 05/13/2013  . Influenza, High Dose Seasonal PF 06/22/2017, 05/14/2018  . Influenza,inj,Quad PF,6+ Mos 06/03/2016  . Influenza-Unspecified 05/07/2014, 06/08/2015  . Pneumococcal Conjugate-13 05/14/2018  . Pneumococcal Polysaccharide-23 07/18/2011  . Td 07/24/2005  . Tdap 08/01/2011  . Zoster 07/23/2012     Objective: Vital Signs: There were no vitals taken for this visit.   Physical Exam   Musculoskeletal Exam: ***  CDAI Exam: CDAI Score: Not documented Patient Global Assessment: Not documented; Provider Global Assessment: Not documented Swollen: Not documented; Tender: Not documented Joint Exam   Not documented   There is currently no information documented on the homunculus. Go to the Rheumatology activity and complete the homunculus joint exam.  Investigation: No additional findings.  Imaging: No results found.  Recent Labs: Lab Results  Component Value Date   WBC 11.7 (H) 08/02/2018   HGB 14.7 08/02/2018   PLT 360 08/02/2018   NA 141 05/14/2018   K 4.5 05/14/2018   CL 102 05/14/2018   CO2 33 (H) 05/14/2018   GLUCOSE 88 05/14/2018   BUN 21 05/14/2018   CREATININE 0.73 05/14/2018   BILITOT 0.6 05/14/2018   ALKPHOS 96 05/14/2018   AST 13 05/14/2018   ALT 14  05/14/2018   PROT 6.1 05/14/2018   ALBUMIN 3.8 05/14/2018   CALCIUM 9.6 05/14/2018   GFRAA >60 12/21/2017    Speciality Comments: No specialty comments available.  Procedures:  No procedures performed Allergies: Bee venom; Accupril [quinapril hcl]; Quinapril; and Quinapril-hydrochlorothiazide   Assessment / Plan:     Visit Diagnoses: No diagnosis found.   Orders: No orders of the defined types were placed in this encounter.  No orders of the defined types were placed in this encounter.   Face-to-face time spent with patient was *** minutes. Greater than 50% of time was spent in counseling and coordination of care.  Follow-Up Instructions: No follow-ups on file.   Earnestine Mealing, CMA  Note - This record has been created using Editor, commissioning.  Chart creation errors have been sought, but may not always  have been located. Such creation errors do not reflect on  the standard of medical care.

## 2018-09-27 NOTE — Progress Notes (Signed)
Office Visit Note  Patient: Rebecca Brooks             Date of Birth: December 27, 1950           MRN: [id number]             PCP: Mosie Lukes, MD Referring: Mosie Lukes, MD Visit Date: 09/30/2018 Occupation: @GUAROCC @  Subjective:  Other (left knee pain )   History of Present Illness: Rebecca Brooks is a 68 y.o. female with history of osteoarthritis and degenerative disc disease.  She states she was at a conference in the [place] last week.  On Wednesday she fell backwards in her bathtub.  She states after that a day later while she was using the coffee machine her left knee gave out and she fell on the floor.  She used a wheelchair until she came back.  She states she has been using of pain.  She states she is having sensation of her left knee joint giving out white after now.  She denies any joint swelling.  She continues to have some discomfort in her other joints due to underlying osteoarthritis.  Activities of Daily Living:  Patient reports morning stiffness for 30 minutes.   Patient Reports nocturnal pain.  Difficulty dressing/grooming: Denies Difficulty climbing stairs: Reports Difficulty getting out of chair: Reports Difficulty using hands for taps, buttons, cutlery, and/or writing: Denies  Review of Systems  Constitutional: Positive for fatigue. Negative for night sweats, weight gain and weight loss.  HENT: Positive for mouth dryness. Negative for mouth sores, trouble swallowing, trouble swallowing and nose dryness.   Eyes: Negative for pain, redness, visual disturbance and dryness.  Respiratory: Negative for cough, shortness of breath and difficulty breathing.   Cardiovascular: Negative for chest pain, palpitations, hypertension, irregular heartbeat and swelling in legs/feet.  Gastrointestinal: Positive for constipation and diarrhea. Negative for blood in stool.  Endocrine: Negative for increased urination.  Genitourinary: Negative for vaginal dryness.    Musculoskeletal: Positive for arthralgias, joint pain and morning stiffness. Negative for joint swelling, myalgias, muscle weakness, muscle tenderness and myalgias.  Skin: Negative for color change, rash, hair loss, skin tightness, ulcers and sensitivity to sunlight.  Allergic/Immunologic: Negative for susceptible to infections.  Neurological: Negative for dizziness, memory loss, night sweats and weakness.  Hematological: Negative for swollen glands.  Psychiatric/Behavioral: Negative for depressed mood and sleep disturbance. The patient is not nervous/anxious.     PMFS History:  Patient Active Problem List   Diagnosis Date Noted  . Atrophic vaginitis 05/14/2018  . OSA (obstructive sleep apnea) 04/26/2018  . Muscle cramps 01/02/2018  . IDA (iron deficiency anemia) 12/22/2016  . Primary osteoarthritis of both knees 10/03/2016  . DDD (degenerative disc disease), cervical 10/03/2016  . DDD (degenerative disc disease), lumbar 10/03/2016  . History of total hip replacement, right 10/03/2016  . History of hypothyroidism 10/03/2016  . History of hypertension 10/03/2016  . History of anemia 10/03/2016  . Chondromalacia 10/03/2016  . Primary osteoarthritis of shoulder 10/03/2016  . Primary osteoarthritis of both hands 10/03/2016  . Abnormal nuclear stress test 08/23/2015  . Globus sensation 08/13/2015  . Special screening for malignant neoplasms, colon 08/13/2015  . Chest pain at rest   . Chest pain with moderate risk of acute coronary syndrome 07/28/2015  . Annual physical exam 07/11/2015  . RLS (restless legs syndrome) 11/01/2014  . Insomnia 11/01/2014  . Thoracic back pain 01/12/2014  . Allergic rhinitis 11/03/2013  . Preventative health care 12/13/2012  .  Recurrent epistaxis 02/20/2012  . Shoulder pain, bilateral 11/01/2011  . Arthralgia of both knees 10/18/2011  . Peripheral edema 09/10/2011  . Asthma 08/19/2011  . Palpitations 08/01/2011  . Anemia 08/01/2011  . Hypokalemia  08/01/2011  . Elevated WBC count 08/01/2011  . GERD (gastroesophageal reflux disease)   . Low back pain radiating to right leg   . Morbid obesity ([employer]) 07/17/2011  . Hypothyroidism 07/17/2011  . Hyperlipidemia 07/17/2011  . Essential hypertension 07/17/2011    Past Medical History:  Diagnosis Date  . Abnormal nuclear stress test 08/23/2015  . Arthritis   . Chicken pox as a child  . Essential hypertension 07/17/2011   ACEI d/c 06/2011 for refractory cough> resolved   . GERD (gastroesophageal reflux disease)   . Hyperlipidemia   . Hypertension   . Hypothyroidism   . Insomnia 11/01/2014  . Low back pain radiating to right leg    intermittent and occasional numbness of skin over right hip in certain positions  . Measles as a child  . Mumps as a child  . Obesity   . Pneumonia    this October from which she has said inhailer  . Recurrent epistaxis 02/20/2012  . Rheumatoid arthritis ([place]) 09/10/2011   Bone on bone in knees Pain in L>R knees.   . RLS (restless legs syndrome) 11/01/2014    Family History  Problem Relation Age of Onset  . Lung cancer Mother 64       smoker  . Heart Problems Mother        tachycardia  . Hearing loss Mother   . Anxiety disorder Mother        anxiety, claustrophobia  . Osteoarthritis Father   . Hyperlipidemia Father   . Anuerysm Father        AAA  . Hyperlipidemia Sister   . Migraines Sister   . Hypertension Sister   . Colon cancer Maternal Grandmother   . Heart attack Maternal Grandfather   . Aneurysm Paternal Grandfather        abdominal  . Heart disease Paternal Grandfather        AAA rupture, smoker  . Heart Problems Sister        tachycardia   Past Surgical History:  Procedure Laterality Date  . CARDIAC CATHETERIZATION N/A 08/23/2015   Procedure: Left Heart Cath and Coronary Angiography;  Surgeon: [name], MD;  Location: [place] [facility];  Service: Cardiovascular;  Laterality: N/A;  . CESAREAN SECTION     X 3  . GASTRECTOMY   12/2017  . INGUINAL HERNIA REPAIR Right 68 yrs old  . THYROIDECTOMY     total for benign tumor, Parathyroid spared  . TONSILLECTOMY    . TOTAL HIP ARTHROPLASTY Right 2006   secondary to congenital  hip defect   Social History   Social History Narrative  . Not on file   Immunization History  Administered Date(s) Administered  . Influenza Split 07/19/2012  . Influenza Whole 06/24/2011, 05/13/2013  . Influenza, High Dose Seasonal PF 06/22/2017, 05/14/2018  . Influenza,inj,Quad PF,6+ Mos 06/03/2016  . Influenza-Unspecified 05/07/2014, 06/08/2015  . Pneumococcal Conjugate-13 05/14/2018  . Pneumococcal Polysaccharide-23 07/18/2011  . Td 07/24/2005  . Tdap 08/01/2011  . Zoster 07/23/2012     Objective: Vital Signs: BP 128/81 (BP Location: Left Arm, Patient Position: Sitting, Cuff Size: Large)   Pulse 75   Resp 15   Ht 5' 2.25" (1.581 m)   Wt 249 lb (112.9 kg)   BMI 45.18 kg/m  Physical Exam Vitals signs and nursing note reviewed.  Constitutional:      Appearance: She is well-developed.  HENT:     Head: Normocephalic and atraumatic.  Eyes:     Conjunctiva/sclera: Conjunctivae normal.  Neck:     Musculoskeletal: Normal range of motion.  Cardiovascular:     Rate and Rhythm: Normal rate and regular rhythm.     Heart sounds: Normal heart sounds.  Pulmonary:     Effort: Pulmonary effort is normal.     Breath sounds: Normal breath sounds.  Abdominal:     General: Bowel sounds are normal.     Palpations: Abdomen is soft.  Lymphadenopathy:     Cervical: No cervical adenopathy.  Skin:    General: Skin is warm and dry.     Capillary Refill: Capillary refill takes less than 2 seconds.  Neurological:     Mental Status: She is alert and oriented to person, place, and time.  Psychiatric:        Behavior: Behavior normal.      Musculoskeletal Exam: Limitation of range of motion noted in cervical and lumbar spine.  She has limited range of motion of her both shoulders.   Elbow joints with good range of motion.  She has DIP and PIP thickening in her bilateral hands due to osteoarthritis.  She has crepitus in her bilateral knee joints without any warmth swelling or effusion.  CDAI Exam: CDAI Score: Not documented Patient Global Assessment: Not documented; Provider Global Assessment: Not documented Swollen: Not documented; Tender: Not documented Joint Exam   Not documented   There is currently no information documented on the homunculus. Go to the Rheumatology activity and complete the homunculus joint exam.  Investigation: No additional findings.  Imaging: No results found.  Recent Labs: Lab Results  Component Value Date   WBC 11.7 (H) 08/02/2018   HGB 14.7 08/02/2018   PLT 360 08/02/2018   NA 141 05/14/2018   K 4.5 05/14/2018   CL 102 05/14/2018   CO2 33 (H) 05/14/2018   GLUCOSE 88 05/14/2018   BUN 21 05/14/2018   CREATININE 0.73 05/14/2018   BILITOT 0.6 05/14/2018   ALKPHOS 96 05/14/2018   AST 13 05/14/2018   ALT 14 05/14/2018   PROT 6.1 05/14/2018   ALBUMIN 3.8 05/14/2018   CALCIUM 9.6 05/14/2018   GFRAA >60 12/21/2017    Speciality Comments: No specialty comments available.  Procedures:  No procedures performed Allergies: Bee venom; Accupril [quinapril hcl]; Quinapril; and Quinapril-hydrochlorothiazide   Assessment / Plan:     Visit Diagnoses: Primary osteoarthritis of both hands-she has severe osteoarthritis in her hands which causes pain and discomfort.  Acute pain of left knee-she has been having pain and discomfort in her left knee for 1 week.  She has had several episodes of her left knee joint giving out on her.  I will obtain x-ray of her left knee joint 3 views today.  The x-ray was consistent with severe end-stage osteoarthritis and severe chondromalacia patella.  We had detailed discussion regarding total knee replacement.  In my opinion she is having instability due to severe nature of her osteoarthritis.  She will do  more research institutions which will accept her low BMI for total knee replacement.  I have also given her prescription for knee brace which may be helpful.  She has joined weight first [employer] loss program and has been working on weight loss as well.  Need for regular exercise was encouraged.  Primary osteoarthritis  of both knees - s/p synvisc bilateral knees 06/2018  Primary osteoarthritis of both shoulders-she has limited range of motion of her shoulders.  History of total hip replacement, right-doing well  DDD (degenerative disc disease), cervical-she has chronic pain.  DDD (degenerative disc disease), lumbar-she continues to have discomfort in the lower back.  Other medical problems are listed as follows:  Primary insomnia - Ambien as needed at bedtime.  History of anemia  History of hypothyroidism  History of high cholesterol  History of hypertension  Peripheral edema  History of obesity   Orders: Orders Placed This Encounter  Procedures  . XR KNEE 3 VIEW LEFT   No orders of the defined types were placed in this encounter.   Face-to-face time spent with patient was 30 minutes. Greater than 50% of time was spent in counseling and coordination of care.  Follow-Up Instructions: Return in about 6 months (around 04/02/2019) for Osteoarthritis.   Bo Merino, MD  Note - This record has been created using Editor, commissioning.  Chart creation errors have been sought, but may not always  have been located. Such creation errors do not reflect on  the standard of medical care.

## 2018-09-30 ENCOUNTER — Encounter: Payer: Self-pay | Admitting: Rheumatology

## 2018-09-30 ENCOUNTER — Ambulatory Visit: Payer: Medicare Other | Admitting: Rheumatology

## 2018-09-30 ENCOUNTER — Ambulatory Visit (INDEPENDENT_AMBULATORY_CARE_PROVIDER_SITE_OTHER): Payer: Medicare Other

## 2018-09-30 ENCOUNTER — Other Ambulatory Visit: Payer: Self-pay | Admitting: Family Medicine

## 2018-09-30 VITALS — BP 128/81 | HR 75 | Resp 15 | Ht 62.25 in | Wt 249.0 lb

## 2018-09-30 DIAGNOSIS — Z96641 Presence of right artificial hip joint: Secondary | ICD-10-CM

## 2018-09-30 DIAGNOSIS — R609 Edema, unspecified: Secondary | ICD-10-CM

## 2018-09-30 DIAGNOSIS — M25562 Pain in left knee: Secondary | ICD-10-CM

## 2018-09-30 DIAGNOSIS — Z8679 Personal history of other diseases of the circulatory system: Secondary | ICD-10-CM

## 2018-09-30 DIAGNOSIS — M19041 Primary osteoarthritis, right hand: Secondary | ICD-10-CM | POA: Diagnosis not present

## 2018-09-30 DIAGNOSIS — M1712 Unilateral primary osteoarthritis, left knee: Secondary | ICD-10-CM | POA: Diagnosis not present

## 2018-09-30 DIAGNOSIS — M5136 Other intervertebral disc degeneration, lumbar region: Secondary | ICD-10-CM

## 2018-09-30 DIAGNOSIS — M170 Bilateral primary osteoarthritis of knee: Secondary | ICD-10-CM | POA: Diagnosis not present

## 2018-09-30 DIAGNOSIS — M19011 Primary osteoarthritis, right shoulder: Secondary | ICD-10-CM

## 2018-09-30 DIAGNOSIS — Z862 Personal history of diseases of the blood and blood-forming organs and certain disorders involving the immune mechanism: Secondary | ICD-10-CM

## 2018-09-30 DIAGNOSIS — M19042 Primary osteoarthritis, left hand: Secondary | ICD-10-CM

## 2018-09-30 DIAGNOSIS — G5601 Carpal tunnel syndrome, right upper limb: Secondary | ICD-10-CM | POA: Diagnosis not present

## 2018-09-30 DIAGNOSIS — F5101 Primary insomnia: Secondary | ICD-10-CM

## 2018-09-30 DIAGNOSIS — Z8639 Personal history of other endocrine, nutritional and metabolic disease: Secondary | ICD-10-CM

## 2018-09-30 DIAGNOSIS — M19012 Primary osteoarthritis, left shoulder: Secondary | ICD-10-CM

## 2018-09-30 DIAGNOSIS — G4700 Insomnia, unspecified: Secondary | ICD-10-CM

## 2018-09-30 DIAGNOSIS — M5030 Other cervical disc degeneration, unspecified cervical region: Secondary | ICD-10-CM

## 2018-10-01 ENCOUNTER — Other Ambulatory Visit: Payer: Self-pay | Admitting: Family Medicine

## 2018-10-01 DIAGNOSIS — G4700 Insomnia, unspecified: Secondary | ICD-10-CM

## 2018-10-01 NOTE — Telephone Encounter (Signed)
Requesting:ambien  Contract:yes UDS:low risk nrxt screen 11/13/18 Last OV:05/14/18 Next OV: 10/18/18  #[id number] Last Refill: Database:   Please advise

## 2018-10-10 ENCOUNTER — Ambulatory Visit: Payer: Medicare Other | Admitting: Rheumatology

## 2018-10-15 ENCOUNTER — Other Ambulatory Visit: Payer: Self-pay

## 2018-10-15 ENCOUNTER — Ambulatory Visit (INDEPENDENT_AMBULATORY_CARE_PROVIDER_SITE_OTHER): Payer: Medicare Other | Admitting: Family Medicine

## 2018-10-15 ENCOUNTER — Ambulatory Visit: Payer: Self-pay | Admitting: *Deleted

## 2018-10-15 ENCOUNTER — Encounter: Payer: Self-pay | Admitting: Family Medicine

## 2018-10-15 DIAGNOSIS — Z20828 Contact with and (suspected) exposure to other viral communicable diseases: Secondary | ICD-10-CM

## 2018-10-15 DIAGNOSIS — J069 Acute upper respiratory infection, unspecified: Secondary | ICD-10-CM | POA: Diagnosis not present

## 2018-10-15 DIAGNOSIS — E782 Mixed hyperlipidemia: Secondary | ICD-10-CM

## 2018-10-15 DIAGNOSIS — Z20822 Contact with and (suspected) exposure to covid-19: Secondary | ICD-10-CM

## 2018-10-15 DIAGNOSIS — Z8679 Personal history of other diseases of the circulatory system: Secondary | ICD-10-CM | POA: Diagnosis not present

## 2018-10-15 DIAGNOSIS — I10 Essential (primary) hypertension: Secondary | ICD-10-CM

## 2018-10-15 MED ORDER — BENZONATATE 100 MG PO CAPS: 100.0000 mg | ORAL_CAPSULE | Freq: Two times a day (BID) | ORAL | 0 refills | Status: DC | PRN

## 2018-10-15 MED ORDER — CEFDINIR 300 MG PO CAPS: 300.0000 mg | ORAL_CAPSULE | Freq: Two times a day (BID) | ORAL | 0 refills | Status: AC

## 2018-10-15 MED ORDER — ALBUTEROL SULFATE HFA 108 (90 BASE) MCG/ACT IN AERS: 2.0000 | INHALATION_SPRAY | Freq: Four times a day (QID) | RESPIRATORY_TRACT | 6 refills | Status: DC | PRN

## 2018-10-15 NOTE — Telephone Encounter (Signed)
Patient had visit with PCP today.

## 2018-10-15 NOTE — Assessment & Plan Note (Signed)
Well controlled, no changes to meds. Encouraged heart healthy diet such as the DASH diet and exercise as tolerated.  °

## 2018-10-15 NOTE — Telephone Encounter (Signed)
Pt and husband called regarding symptoms they are experiencing since coming in contact with their son, who has tested positive for the coronavirus.   She had been isolated at home with her husband since Friday.  On March 10 th she and husband took their son to the airport. She was in the car with him for about 20 mins. He had been at home for one night before going to [name].  She did not hug or kiss him. He sat in the back sea. Son had come from [place] and had played in his band so had come in contact with a lot of people.  Friday she had "itchy ears". And started feeling bad. She felt congested, with a dry cough and sore throat. She had some diarrhea yesterday.  She denies shortness of breath or chest tightness. Her temp has been around 99 degrees.  She had been in the house with her husband since th 51 th. Housekeeper did come on Saturday without symptoms. Flow at [facility] notified regarding appointments and testing. Call was conferenced with Princess at the office. Routing to the office.  Reason for Disposition . [1] Cough occurs AND [2] within 14 days of COVID-19 EXPOSURE  Answer Assessment - Initial Assessment Questions 1. CONFIRMED CASE: "Who is the person with the confirmed COVID-19 infection that you were exposed to?"     son 2. PLACE of CONTACT: "Where were you when you were exposed to COVID-19  (coronavirus disease 2019)?" (e.g., city, state, country)     In the car here taking son to the airport 3. TYPE of CONTACT: "How much contact was there?" (e.g., live in same house, work in same office, same school)     In car with son. He was in the seat behind her for about 20 mins and did not hug 4. DATE of CONTACT: "When did you have contact with a coronavirus patient?" (e.g., days)     March 10 th 5. DURATION of CONTACT: "How long were you in contact with the COVID-19 (coronavirus disease) patient?" (e.g., a few seconds, passed by person, a few minutes, live with the  patient)     20 mins 6. SYMPTOMS: "Do you have any symptoms?" (e.g., fever, cough, breathing difficulty)     Cough 7. PREGNANCY OR POSTPARTUM: "Is there any chance you are pregnant?" "When was your last menstrual period?" "Did you deliver in the last 2 weeks?"     n/a 8. HIGH RISK: "Do you have any heart or lung problems? Do you have a weakened immune system?" (e.g., CHF, COPD, asthma, HIV positive, chemotherapy, renal failure, diabetes mellitus, sickle cell anemia)     Asthma  Protocols used: CORONAVIRUS (COVID-19) EXPOSURE-A-AH

## 2018-10-15 NOTE — Assessment & Plan Note (Signed)
Encouraged heart healthy diet, increase exercise, avoid trans fats, consider a krill oil cap daily 

## 2018-10-16 DIAGNOSIS — J069 Acute upper respiratory infection, unspecified: Secondary | ICD-10-CM | POA: Insufficient documentation

## 2018-10-16 DIAGNOSIS — Z8616 Personal history of COVID-19: Secondary | ICD-10-CM | POA: Insufficient documentation

## 2018-10-16 DIAGNOSIS — Z20822 Contact with and (suspected) exposure to covid-19: Secondary | ICD-10-CM | POA: Insufficient documentation

## 2018-10-16 DIAGNOSIS — Z20828 Contact with and (suspected) exposure to other viral communicable diseases: Secondary | ICD-10-CM | POA: Insufficient documentation

## 2018-10-16 NOTE — Assessment & Plan Note (Signed)
She had contact with her son who has tested positive for Covid after traveling.

## 2018-10-16 NOTE — Progress Notes (Signed)
Virtual Visit via Video Note  I connected with [name] on 10/16/18 at  3:15 PM EDT by a video enabled telemedicine application and verified that I am speaking with the correct person using two identifiers.   I discussed the limitations of evaluation and management by telemedicine and the availability of in person appointments. The patient expressed understanding and agreed to proceed. Patient was gotten on the phone by [name], [place]     Chief Complaint  Patient presents with  . Not well    Ears, throat, congestion some tightness with dry cough, Son was positive for Covid-19     HPI Patient is in today for evaluation of respiratory illness. Her son has traveled, she has had contact with him and he has tested positive for Covid. She is struggling with congestion, throat irritation, cough-dry, malaise, myalgias. No high grade fevers or chills. No gastrointestinal symptoms. She notes her ears feel itchy.   Past Medical History:  Diagnosis Date  . Abnormal nuclear stress test 08/23/2015  . Arthritis   . Chicken pox as a child  . Essential hypertension 07/17/2011   ACEI d/c 06/2011 for refractory cough> resolved   . GERD (gastroesophageal reflux disease)   . Hyperlipidemia   . Hypertension   . Hypothyroidism   . Insomnia 11/01/2014  . Low back pain radiating to right leg    intermittent and occasional numbness of skin over right hip in certain positions  . Measles as a child  . Mumps as a child  . Obesity   . Pneumonia    this October from which she has said inhailer  . Recurrent epistaxis 02/20/2012  . Rheumatoid arthritis ([place]) 09/10/2011   Bone on bone in knees Pain in L>R knees.   . RLS (restless legs syndrome) 11/01/2014    Past Surgical History:  Procedure Laterality Date  . CARDIAC CATHETERIZATION N/A 08/23/2015   Procedure: Left Heart Cath and Coronary Angiography;  Surgeon: [name], MD;  Location: [facility];  Service: Cardiovascular;   Laterality: N/A;  . CESAREAN SECTION     X 3  . GASTRECTOMY  12/2017  . INGUINAL HERNIA REPAIR Right 68 yrs old  . THYROIDECTOMY     total for benign tumor, Parathyroid spared  . TONSILLECTOMY    . TOTAL HIP ARTHROPLASTY Right 2006   secondary to congenital  hip defect    Family History  Problem Relation Age of Onset  . Lung cancer Mother 53       smoker  . Heart Problems Mother        tachycardia  . Hearing loss Mother   . Anxiety disorder Mother        anxiety, claustrophobia  . Osteoarthritis Father   . Hyperlipidemia Father   . Anuerysm Father        AAA  . Hyperlipidemia Sister   . Migraines Sister   . Hypertension Sister   . Colon cancer Maternal Grandmother   . Heart attack Maternal Grandfather   . Aneurysm Paternal Grandfather        abdominal  . Heart disease Paternal Grandfather        AAA rupture, smoker  . Heart Problems Sister        tachycardia    Social History   Socioeconomic History  . Marital status: Married    Spouse name: Not on file  . Number of children: 3  . Years of education: Not on file  . Highest education level:  Not on file  Occupational History  . Occupation: Training and development officer  . Occupation: Primary school teacher  Social Needs  . Financial resource strain: Not on file  . Food insecurity:    Worry: Not on file    Inability: Not on file  . Transportation needs:    Medical: Not on file    Non-medical: Not on file  Tobacco Use  . Smoking status: Former Smoker    Packs/day: 0.50    Years: 5.00    Pack years: 2.50    Types: Cigarettes    Last attempt to quit: 07/25/1971    Years since quitting: 47.2  . Smokeless tobacco: Never Used  Substance and Sexual Activity  . Alcohol use: Not Currently    Comment: rarely   . Drug use: No  . 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 Physical activity:    Days per week: Not on file    Minutes per session: Not on file  . Stress: Not on file   Relationships  . Social connections:    Talks on phone: Not on file    Gets together: Not on file    Attends religious service: Not on file    Active member of club or organization: Not on file    Attends meetings of clubs or organizations: Not on file    Relationship status: Not on file  . Intimate partner violence:    Fear of current or ex partner: Not on file    Emotionally abused: Not on file    Physically abused: Not on file    Forced sexual activity: Not on file  Other Topics Concern  . Not on file  Social History Narrative  . Not on file    Outpatient Medications Prior to Visit  Medication Sig Dispense Refill  . aspirin EC 81 MG tablet Take 1 tablet (81 mg total) by mouth daily.    . cetirizine (ZYRTEC) 10 MG tablet TAKE 1 TABLET BY MOUTH DAILY 90 tablet 1  . Cholecalciferol (VITAMIN D3) 1000 units CAPS Take 1 capsule (1,000 Units total) daily by mouth. (Patient taking differently: Take 2 capsules by mouth daily. ) 90 capsule 0  . conjugated estrogens (PREMARIN) vaginal cream Place 1 Applicatorful vaginally daily. (Patient not taking: Reported on 09/30/2018) 42.5 g 3  . Cyanocobalamin (VITAMIN B-12) 1000 MCG SUBL Place under the tongue.    . diclofenac sodium (VOLTAREN) 1 % GEL Apply 3 grams to 3 large joints up to 3 times daily 3 Tube 3  . famotidine (PEPCID) 20 MG tablet TAKE 1 TABLET(20 MG) BY MOUTH AT BEDTIME 90 tablet 0  . fluticasone (FLONASE) 50 MCG/ACT nasal spray SHAKE LIQUID AND USE 2 SPRAYS IN EACH NOSTRIL DAILY 48 g 2  . furosemide (LASIX) 20 MG tablet TAKE 2 TABLETS BY MOUTH DAILY FOR 5 DAYS THEN DECREASE TO 1 TABLET BY MOUTH DAILY AS NEEDED 180 tablet 0  . hydrochlorothiazide (HYDRODIURIL) 25 MG tablet TAKE 1 TABLET BY MOUTH DAILY 90 tablet 0  . levothyroxine (SYNTHROID, LEVOTHROID) 100 MCG tablet TAKE 1 TABLET BY MOUTH DAILY BEFORE BREAKFAST 90 tablet 0  . MELATONIN PO Take 1 tablet by mouth at bedtime as needed (sleep).    . metoprolol tartrate (LOPRESSOR) 50  MG tablet TAKE 1 TABLET BY MOUTH TWICE DAILY 180 tablet 0  . nitroGLYCERIN (NITROSTAT) 0.4 MG SL tablet Place 1 tablet (0.4 mg total) under the tongue every 5 (five) minutes as  needed for chest pain. 25 tablet 3  . pantoprazole (PROTONIX) 40 MG tablet TAKE 1 TABLET BY MOUTH DAILY 90 tablet 1  . potassium chloride SA (K-DUR,KLOR-CON) 20 MEQ tablet TAKE 1 TABLET BY MOUTH DAILY AS NEEDED(DEPENDING ON LASIX USE) 90 tablet 3  . rOPINIRole (REQUIP) 0.5 MG tablet TAKE 1 TO 2 TABLETS BY MOUTH AT BEDTIME IF NEEDED 180 tablet 1  . SENNA PO Take by mouth daily.    . simvastatin (ZOCOR) 40 MG tablet TAKE 1 TABLET BY MOUTH EVERY EVENING 90 tablet 0  . topiramate (TOPAMAX) 50 MG tablet TK 1/2 T PO AT DINNER FOR 1 WEEK THEN INCREASE TO A FULL TABLET THEREAFTER    . traMADol (ULTRAM) 50 MG tablet TAKE 1 TO 2 TABLETS(50 TO 100 MG) BY MOUTH EVERY 8 HOURS AS NEEDED 90 tablet 0  . ursodiol (ACTIGALL) 250 MG tablet   0  . zolpidem (AMBIEN) 10 MG tablet TAKE 1 TABLET(10 MG) BY MOUTH AT BEDTIME AS NEEDED FOR SLEEP 30 tablet 0  . albuterol (PROAIR HFA) 108 (90 Base) MCG/ACT inhaler Inhale 2 puffs into the lungs every 6 (six) hours as needed for wheezing or shortness of breath. 8.5 Inhaler 6   No facility-administered medications prior to visit.     Allergies  Allergen Reactions  . Bee Venom Anaphylaxis  . Accupril [Quinapril Hcl] Cough  . Quinapril   . Quinapril-Hydrochlorothiazide Cough    Review of Systems  Constitutional: Positive for malaise/fatigue. Negative for fever.  HENT: Positive for congestion and sore throat.   Eyes: Negative for blurred vision.  Respiratory: Positive for cough and shortness of breath.   Cardiovascular: Negative for chest pain, palpitations and leg swelling.  Gastrointestinal: Negative for abdominal pain, blood in stool and nausea.  Genitourinary: Negative for dysuria and frequency.  Musculoskeletal: Negative for falls.  Skin: Negative for rash.  Neurological: Negative for  dizziness, loss of consciousness and headaches.  Endo/Heme/Allergies: Negative for environmental allergies.  Psychiatric/Behavioral: Negative for depression. The patient is not nervous/anxious.        Objective:    Physical Exam Vitals signs reviewed.  Neurological:     Mental Status: She is alert.  Psychiatric:        Mood and Affect: Mood normal.        Behavior: Behavior normal.     Wt 245 lb (111.1 kg)   BMI 44.45 kg/m  Wt Readings from Last 3 Encounters:  10/15/18 245 lb (111.1 kg)  09/30/18 249 lb (112.9 kg)  08/14/18 247 lb (112 kg)    Diabetic Foot Exam - Simple   No data filed     Lab Results  Component Value Date   WBC 11.7 (H) 08/02/2018   HGB 14.7 08/02/2018   HCT 44.9 08/02/2018   PLT 360 08/02/2018   GLUCOSE 88 05/14/2018   CHOL 177 05/14/2018   TRIG 178.0 (H) 05/14/2018   HDL 52.60 05/14/2018   LDLCALC 89 05/14/2018   ALT 14 05/14/2018   AST 13 05/14/2018   NA 141 05/14/2018   K 4.5 05/14/2018   CL 102 05/14/2018   CREATININE 0.73 05/14/2018   BUN 21 05/14/2018   CO2 33 (H) 05/14/2018   TSH 1.27 05/14/2018   INR 0.99 08/19/2015    Lab Results  Component Value Date   TSH 1.27 05/14/2018   Lab Results  Component Value Date   WBC 11.7 (H) 08/02/2018   HGB 14.7 08/02/2018   HCT 44.9 08/02/2018   MCV 95.1 08/02/2018  PLT 360 08/02/2018   Lab Results  Component Value Date   NA 141 05/14/2018   K 4.5 05/14/2018   CHLORIDE 103 12/21/2016   CO2 33 (H) 05/14/2018   GLUCOSE 88 05/14/2018   BUN 21 05/14/2018   CREATININE 0.73 05/14/2018   BILITOT 0.6 05/14/2018   ALKPHOS 96 05/14/2018   AST 13 05/14/2018   ALT 14 05/14/2018   PROT 6.1 05/14/2018   ALBUMIN 3.8 05/14/2018   CALCIUM 9.6 05/14/2018   ANIONGAP 12 (H) 12/21/2017   EGFR 73 (L) 12/21/2016   GFR 84.30 05/14/2018   Lab Results  Component Value Date   CHOL 177 05/14/2018   Lab Results  Component Value Date   HDL 52.60 05/14/2018   Lab Results  Component Value  Date   LDLCALC 89 05/14/2018   Lab Results  Component Value Date   TRIG 178.0 (H) 05/14/2018   Lab Results  Component Value Date   CHOLHDL 3 05/14/2018   No results found for: HGBA1C     Assessment & Plan:   Problem List Items Addressed This Visit    Hyperlipidemia    Encouraged heart healthy diet, increase exercise, avoid trans fats, consider a krill oil cap daily      Essential hypertension    She does not endorse any flare in numbers recently      History of hypertension    Well controlled, no changes to meds. Encouraged heart healthy diet such as the DASH diet and exercise as tolerated.       Exposure to Covid-19 Virus    She had contact with her son who has tested positive for Covid after traveling.       URI (upper respiratory infection)    She is not feeling well but no high grade fever or SOB. Encouraged increased rest and hydration, add probiotics, zinc such as Coldeze or Xicam. Treat fevers as needed. Tylenol prn. If she develops SOB to ER. If improves and then develops fever and productive cough then she could consider a trial of antibiotics. Seek care if worse.      Relevant Medications   cefdinir (OMNICEF) 300 MG capsule      I am having Rosanne C. Runkles start on benzonatate and cefdinir. I am also having her maintain her aspirin EC, nitroGLYCERIN, MELATONIN PO, potassium chloride SA, Vitamin D3, Vitamin B-12, furosemide, fluticasone, diclofenac sodium, metoprolol tartrate, ursodiol, conjugated estrogens, cetirizine, rOPINIRole, famotidine, levothyroxine, SENNA PO, simvastatin, traMADol, hydrochlorothiazide, topiramate, zolpidem, pantoprazole, and albuterol.  Meds ordered this encounter  Medications  . albuterol (PROAIR HFA) 108 (90 Base) MCG/ACT inhaler    Sig: Inhale 2 puffs into the lungs every 6 (six) hours as needed for wheezing or shortness of breath.    Dispense:  8.5 Inhaler    Refill:  6  . benzonatate (TESSALON) 100 MG capsule    Sig: Take 1  capsule (100 mg total) by mouth 2 (two) times daily as needed for cough.    Dispense:  30 capsule    Refill:  0  . cefdinir (OMNICEF) 300 MG capsule    Sig: Take 1 capsule (300 mg total) by mouth 2 (two) times daily for 10 days.    Dispense:  20 capsule    Refill:  0     I discussed the assessment and treatment plan with the patient. The patient was provided an opportunity to ask questions and all were answered. The patient agreed with the plan and demonstrated an understanding of the  instructions.   The patient was advised to call back or seek an in-person evaluation if the symptoms worsen or if the condition fails to improve as anticipated.  I provided 12 minutes of non-face-to-face time during this encounter.   Penni Homans, MD

## 2018-10-16 NOTE — Assessment & Plan Note (Signed)
She does not endorse any flare in numbers recently

## 2018-10-16 NOTE — Assessment & Plan Note (Signed)
She is not feeling well but no high grade fever or SOB. Encouraged increased rest and hydration, add probiotics, zinc such as Coldeze or Xicam. Treat fevers as needed. Tylenol prn. If she develops SOB to ER. If improves and then develops fever and productive cough then she could consider a trial of antibiotics. Seek care if worse.

## 2018-10-18 ENCOUNTER — Ambulatory Visit: Payer: Medicare Other | Admitting: Family Medicine

## 2018-10-24 ENCOUNTER — Other Ambulatory Visit: Payer: Self-pay | Admitting: Family Medicine

## 2018-10-25 ENCOUNTER — Other Ambulatory Visit: Payer: Self-pay | Admitting: Family Medicine

## 2018-11-05 DIAGNOSIS — R635 Abnormal weight gain: Secondary | ICD-10-CM | POA: Diagnosis not present

## 2018-11-05 DIAGNOSIS — E8881 Metabolic syndrome: Secondary | ICD-10-CM | POA: Diagnosis not present

## 2018-11-05 DIAGNOSIS — I10 Essential (primary) hypertension: Secondary | ICD-10-CM | POA: Diagnosis not present

## 2018-11-18 ENCOUNTER — Other Ambulatory Visit: Payer: Self-pay | Admitting: Family Medicine

## 2018-11-18 NOTE — Telephone Encounter (Signed)
Requesting:tramadol Contract:n/a [id number] risk next screen 11/15/18 Last OV:10/15/18 Next OV:12/17/18 Last Refill:09/11/18  #[id number] Database:   Please advise

## 2018-11-27 DIAGNOSIS — Z20828 Contact with and (suspected) exposure to other viral communicable diseases: Secondary | ICD-10-CM | POA: Diagnosis not present

## 2018-11-28 ENCOUNTER — Encounter: Payer: Self-pay | Admitting: Gastroenterology

## 2018-11-29 ENCOUNTER — Inpatient Hospital Stay: Payer: Medicare Other

## 2018-11-29 ENCOUNTER — Other Ambulatory Visit: Payer: Self-pay

## 2018-11-29 ENCOUNTER — Inpatient Hospital Stay: Payer: Medicare Other | Attending: Family | Admitting: Family

## 2018-11-29 ENCOUNTER — Encounter: Payer: Self-pay | Admitting: Family

## 2018-11-29 ENCOUNTER — Other Ambulatory Visit: Payer: Self-pay | Admitting: Family

## 2018-11-29 VITALS — BP 137/66 | HR 66 | Temp 98.0°F | Resp 19 | Ht 62.0 in | Wt 256.1 lb

## 2018-11-29 DIAGNOSIS — R531 Weakness: Secondary | ICD-10-CM | POA: Diagnosis not present

## 2018-11-29 DIAGNOSIS — D508 Other iron deficiency anemias: Secondary | ICD-10-CM | POA: Diagnosis not present

## 2018-11-29 DIAGNOSIS — D72829 Elevated white blood cell count, unspecified: Secondary | ICD-10-CM

## 2018-11-29 DIAGNOSIS — R6883 Chills (without fever): Secondary | ICD-10-CM | POA: Diagnosis not present

## 2018-11-29 DIAGNOSIS — M1990 Unspecified osteoarthritis, unspecified site: Secondary | ICD-10-CM | POA: Insufficient documentation

## 2018-11-29 DIAGNOSIS — R7989 Other specified abnormal findings of blood chemistry: Secondary | ICD-10-CM | POA: Insufficient documentation

## 2018-11-29 DIAGNOSIS — D72828 Other elevated white blood cell count: Secondary | ICD-10-CM | POA: Diagnosis not present

## 2018-11-29 DIAGNOSIS — R5383 Other fatigue: Secondary | ICD-10-CM | POA: Diagnosis not present

## 2018-11-29 DIAGNOSIS — K912 Postsurgical malabsorption, not elsewhere classified: Secondary | ICD-10-CM | POA: Insufficient documentation

## 2018-11-29 DIAGNOSIS — D75839 Thrombocytosis, unspecified: Secondary | ICD-10-CM

## 2018-11-29 DIAGNOSIS — K9589 Other complications of other bariatric procedure: Secondary | ICD-10-CM | POA: Insufficient documentation

## 2018-11-29 DIAGNOSIS — R0602 Shortness of breath: Secondary | ICD-10-CM | POA: Diagnosis not present

## 2018-11-29 DIAGNOSIS — D473 Essential (hemorrhagic) thrombocythemia: Secondary | ICD-10-CM

## 2018-11-29 LAB — CBC WITH DIFFERENTIAL (CANCER CENTER ONLY)
Abs Immature Granulocytes: 0.03 10*3/uL (ref 0.00–0.07)
Basophils Absolute: 0.1 10*3/uL (ref 0.0–0.1)
Basophils Relative: 1 %
Eosinophils Absolute: 0.1 10*3/uL (ref 0.0–0.5)
Eosinophils Relative: 1 %
HCT: 41.8 % (ref 36.0–46.0)
Hemoglobin: 13.8 g/dL (ref 12.0–15.0)
Immature Granulocytes: 0 %
Lymphocytes Relative: 20 %
Lymphs Abs: 1.8 10*3/uL (ref 0.7–4.0)
MCH: 31.2 pg (ref 26.0–34.0)
MCHC: 33 g/dL (ref 30.0–36.0)
MCV: 94.4 fL (ref 80.0–100.0)
Monocytes Absolute: 0.6 10*3/uL (ref 0.1–1.0)
Monocytes Relative: 7 %
Neutro Abs: 6.4 10*3/uL (ref 1.7–7.7)
Neutrophils Relative %: 71 %
Platelet Count: 356 10*3/uL (ref 150–400)
RBC: 4.43 MIL/uL (ref 3.87–5.11)
RDW: 13.2 % (ref 11.5–15.5)
WBC Count: 9.1 10*3/uL (ref 4.0–10.5)
nRBC: 0 % (ref 0.0–0.2)

## 2018-11-29 LAB — RETICULOCYTES
Immature Retic Fract: 13.4 % (ref 2.3–15.9)
RBC.: 4.5 MIL/uL (ref 3.87–5.11)
Retic Count, Absolute: 96.3 10*3/uL (ref 19.0–186.0)
Retic Ct Pct: 2.1 % (ref 0.4–3.1)

## 2018-11-29 NOTE — Progress Notes (Signed)
Hematology and Oncology Follow Up Visit  Rebecca Brooks [id number] 12/13/1950 68 y.o. 11/29/2018   Principle Diagnosis:  Reactive leukocytosis Iron deficiency anemia  Thrombocytosis  Current Therapy:   IV iron as indicated    Interim History:  Rebecca Brooks is here today for follow-up. She is recuperating from likely having the Corona virus in March. Her son tested positive and both she and her husband came down with the same symptoms. Her PCP had her isolate for 2 weeks and she is going this afternoon to be tested for antibodies.  She is still symptomatic with SOB with exertion, fatigue and weakness. She has also noted chills and is chewing ice.  No episodes of bleeding, no bruising or petechiae.  No fever, n/v, cough, rash, dizziness, chest pain, palpitations, abdominal pain or changes in bowel or bladder habits.  No swelling, tenderness, numbness or tingling in her extremities.  No lymphadenopathy noted on exam.  She has maintained a good appetite but admits that she needs to better hydrate at home. Her weight is stable.   ECOG Performance Status: 1 - Symptomatic but completely ambulatory  Medications:  Allergies as of 11/29/2018      Reactions   Bee Venom Anaphylaxis   Accupril [quinapril Hcl] Cough   Quinapril Cough   Quinapril-hydrochlorothiazide Cough      Medication List       Accurate as of Nov 29, 2018  2:33 PM. If you have any questions, ask your nurse or doctor.        STOP taking these medications   ursodiol 250 MG tablet Commonly known as:  ACTIGALL Stopped by:  Laverna Peace, NP     TAKE these medications   albuterol 108 (90 Base) MCG/ACT inhaler Commonly known as:  ProAir HFA Inhale 2 puffs into the lungs every 6 (six) hours as needed for wheezing or shortness of breath.   aspirin EC 81 MG tablet Take 1 tablet (81 mg total) by mouth daily.   benzonatate 100 MG capsule Commonly known as:  TESSALON Take 1 capsule (100 mg total) by mouth 2 (two)  times daily as needed for cough.   cetirizine 10 MG tablet Commonly known as:  ZYRTEC TAKE 1 TABLET BY MOUTH DAILY   conjugated estrogens vaginal cream Commonly known as:  Premarin Place 1 Applicatorful vaginally daily.   diclofenac sodium 1 % Gel Commonly known as:  VOLTAREN Apply 3 grams to 3 large joints up to 3 times daily   famotidine 20 MG tablet Commonly known as:  PEPCID TAKE 1 TABLET(20 MG) BY MOUTH AT BEDTIME   fluticasone 50 MCG/ACT nasal spray Commonly known as:  FLONASE SHAKE LIQUID AND USE 2 SPRAYS IN EACH NOSTRIL DAILY   furosemide 20 MG tablet Commonly known as:  LASIX TAKE 2 TABLETS BY MOUTH DAILY FOR 5 DAYS THEN DECREASE TO 1 TABLET BY MOUTH DAILY AS NEEDED   hydrochlorothiazide 25 MG tablet Commonly known as:  HYDRODIURIL TAKE 1 TABLET BY MOUTH DAILY   levothyroxine 100 MCG tablet Commonly known as:  SYNTHROID TAKE 1 TABLET BY MOUTH DAILY BEFORE BREAKFAST   MELATONIN PO Take 1 tablet by mouth at bedtime as needed (sleep).   metoprolol tartrate 50 MG tablet Commonly known as:  LOPRESSOR TAKE 1 TABLET BY MOUTH TWICE DAILY   nitroGLYCERIN 0.4 MG SL tablet Commonly known as:  NITROSTAT Place 1 tablet (0.4 mg total) under the tongue every 5 (five) minutes as needed for chest pain.   pantoprazole 40 MG tablet  Commonly known as:  PROTONIX TAKE 1 TABLET BY MOUTH DAILY   potassium chloride SA 20 MEQ tablet Commonly known as:  K-DUR TAKE 1 TABLET BY MOUTH DAILY AS NEEDED(DEPENDING ON LASIX USE)   rOPINIRole 0.5 MG tablet Commonly known as:  REQUIP TAKE 1 TO 2 TABLETS BY MOUTH AT BEDTIME AS NEEDED What changed:  See the new instructions. Changed by:  Nance Pear, NP   SENNA PO Take by mouth daily.   simvastatin 40 MG tablet Commonly known as:  ZOCOR TAKE 1 TABLET BY MOUTH EVERY EVENING   topiramate 50 MG tablet Commonly known as:  TOPAMAX TK 1/2 T PO AT DINNER FOR 1 WEEK THEN INCREASE TO A FULL TABLET THEREAFTER   traMADol 50 MG  tablet Commonly known as:  ULTRAM TAKE 1 TO 2 TABLETS(50 TO 100 MG) BY MOUTH EVERY 8 HOURS AS NEEDED   Vitamin B-12 1000 MCG Subl Place under the tongue.   Vitamin D3 25 MCG (1000 UT) Caps Take 1 capsule (1,000 Units total) daily by mouth. What changed:  how much to take   zolpidem 10 MG tablet Commonly known as:  AMBIEN TAKE 1 TABLET(10 MG) BY MOUTH AT BEDTIME AS NEEDED FOR SLEEP       Allergies:  Allergies  Allergen Reactions   Bee Venom Anaphylaxis   Accupril [Quinapril Hcl] Cough   Quinapril Cough   Quinapril-Hydrochlorothiazide Cough    Past Medical History, Surgical history, Social history, and Family History were reviewed and updated.  Review of Systems: All other 10 point review of systems is negative.   Physical Exam:  height is 5\' 2"  (1.575 m) and weight is 256 lb 1.9 oz (116.2 kg). Her oral temperature is 98 F (36.7 C). Her blood pressure is 137/66 and her pulse is 66. Her respiration is 19 and oxygen saturation is 100%.   Wt Readings from Last 3 Encounters:  11/29/18 256 lb 1.9 oz (116.2 kg)  10/15/18 245 lb (111.1 kg)  09/30/18 249 lb (112.9 kg)    Ocular: Sclerae unicteric, pupils equal, round and reactive to light Ear-nose-throat: Oropharynx clear, dentition fair Lymphatic: No cervical or supraclavicular adenopathy Lungs no rales or rhonchi, good excursion bilaterally Heart regular rate and rhythm, no murmur appreciated Abd soft, nontender, positive bowel sounds, no liver or spleen tip palpated on exam, no fluid wave  MSK no focal spinal tenderness, no joint edema Neuro: non-focal, well-oriented, appropriate affect Breasts: Deferred   Lab Results  Component Value Date   WBC 9.1 11/29/2018   HGB 13.8 11/29/2018   HCT 41.8 11/29/2018   MCV 94.4 11/29/2018   PLT 356 11/29/2018   Lab Results  Component Value Date   FERRITIN 344 (H) 08/02/2018   IRON 69 08/02/2018   TIBC 315 08/02/2018   UIBC 246 08/02/2018   IRONPCTSAT 22 08/02/2018    Lab Results  Component Value Date   RETICCTPCT 2.1 11/29/2018   RBC 4.50 11/29/2018   No results found for: KPAFRELGTCHN, LAMBDASER, KAPLAMBRATIO No results found for: IGGSERUM, IGA, IGMSERUM No results found for: Ronnald Ramp, A1GS, A2GS, Tillman Sers, SPEI   Chemistry      Component Value Date/Time   NA 141 05/14/2018 1424   NA 143 06/22/2017 1311   NA 142 12/21/2016 0922   K 4.5 05/14/2018 1424   K 3.1 (L) 06/22/2017 1311   K 3.5 12/21/2016 0922   CL 102 05/14/2018 1424   CL 99 06/22/2017 1311   CO2 33 (H) 05/14/2018  1424   CO2 32 06/22/2017 1311   CO2 30 (H) 12/21/2016 0922   Brooks 21 05/14/2018 1424   Brooks 21 06/22/2017 1311   Brooks 22.4 12/21/2016 0922   CREATININE 0.73 05/14/2018 1424   CREATININE 0.76 12/21/2017 1327   CREATININE 0.9 06/22/2017 1311   CREATININE 0.8 12/21/2016 0922      Component Value Date/Time   CALCIUM 9.6 05/14/2018 1424   CALCIUM 8.9 06/22/2017 1311   CALCIUM 9.2 12/21/2016 0922   ALKPHOS 96 05/14/2018 1424   ALKPHOS 99 (H) 06/22/2017 1311   ALKPHOS 114 12/21/2016 0922   AST 13 05/14/2018 1424   AST 16 12/21/2017 1327   AST 15 12/21/2016 0922   ALT 14 05/14/2018 1424   ALT 17 12/21/2017 1327   ALT 19 06/22/2017 1311   ALT 13 12/21/2016 0922   BILITOT 0.6 05/14/2018 1424   BILITOT 0.5 12/21/2017 1327   BILITOT 0.29 12/21/2016 0922       Impression and Plan: Rebecca Brooks is a very pleasant 68 yo caucasian female with mild reactive leukocytosis with arthritis and iron deficiency secondary to malabsorption 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  Her WBC count is stable at 9.1.  We will see what her iron studies show and bring her back in for infusion if needed.  We will plan to see her back in another 8 weeks.  She will contact our office with any questions or concerns. We can certainly see her sooner if need be.   Laverna Peace, NP 5/8/20202:33 PM

## 2018-12-02 ENCOUNTER — Encounter: Payer: Self-pay | Admitting: Family

## 2018-12-02 ENCOUNTER — Telehealth: Payer: Self-pay | Admitting: Family

## 2018-12-02 LAB — IRON AND TIBC
Iron: 95 ug/dL (ref 41–142)
Saturation Ratios: 33 % (ref 21–57)
TIBC: 283 ug/dL (ref 236–444)
UIBC: 188 ug/dL (ref 120–384)

## 2018-12-02 LAB — FERRITIN: Ferritin: 294 ng/mL (ref 11–307)

## 2018-12-02 NOTE — Telephone Encounter (Signed)
Appts scheduled letter/calendar mailes per 5/8 los

## 2018-12-03 ENCOUNTER — Telehealth: Payer: Self-pay | Admitting: Family

## 2018-12-03 NOTE — Telephone Encounter (Signed)
Appts scheduled letter/calendar mailed per 5/8 los

## 2018-12-04 ENCOUNTER — Telehealth: Payer: Self-pay | Admitting: *Deleted

## 2018-12-04 NOTE — Telephone Encounter (Signed)
Appts scheduled letter/calendar mailed

## 2018-12-17 ENCOUNTER — Other Ambulatory Visit: Payer: Self-pay

## 2018-12-17 ENCOUNTER — Ambulatory Visit (INDEPENDENT_AMBULATORY_CARE_PROVIDER_SITE_OTHER): Payer: Medicare Other | Admitting: Family Medicine

## 2018-12-17 DIAGNOSIS — I10 Essential (primary) hypertension: Secondary | ICD-10-CM

## 2018-12-17 DIAGNOSIS — G4700 Insomnia, unspecified: Secondary | ICD-10-CM

## 2018-12-17 DIAGNOSIS — E782 Mixed hyperlipidemia: Secondary | ICD-10-CM

## 2018-12-17 DIAGNOSIS — E039 Hypothyroidism, unspecified: Secondary | ICD-10-CM | POA: Diagnosis not present

## 2018-12-17 DIAGNOSIS — J069 Acute upper respiratory infection, unspecified: Secondary | ICD-10-CM

## 2018-12-17 DIAGNOSIS — M546 Pain in thoracic spine: Secondary | ICD-10-CM

## 2018-12-17 MED ORDER — SIMVASTATIN 40 MG PO TABS: 40.0000 mg | ORAL_TABLET | Freq: Every evening | ORAL | 1 refills | Status: DC

## 2018-12-17 MED ORDER — METOPROLOL TARTRATE 50 MG PO TABS: 50.0000 mg | ORAL_TABLET | Freq: Two times a day (BID) | ORAL | 1 refills | Status: DC

## 2018-12-17 MED ORDER — TRAMADOL HCL 50 MG PO TABS: ORAL_TABLET | ORAL | 0 refills | Status: DC

## 2018-12-17 MED ORDER — VITAMIN D3 25 MCG (1000 UT) PO CAPS: 1.0000 | ORAL_CAPSULE | Freq: Every day | ORAL | 0 refills | Status: DC

## 2018-12-17 MED ORDER — HYDROCHLOROTHIAZIDE 25 MG PO TABS: 25.0000 mg | ORAL_TABLET | Freq: Every day | ORAL | 1 refills | Status: DC

## 2018-12-17 NOTE — Progress Notes (Signed)
Virtual Visit via Video Note  I connected with Rebecca Brooks on 12/17/18 at  2:00 PM EDT by a video enabled telemedicine application and verified that I am speaking with the correct person using two identifiers.  Location: Patient: home Provider: home   I discussed the limitations of evaluation and management by telemedicine and the availability of in person appointments. The patient expressed understanding and agreed to proceed. [name], CMA was able to get patient set up on virtual video visit.     Subjective:    [name], female    DOB: 17-Sep-1950, 68 y.o.   MRN: [id number]  No chief complaint on file.   HPI Patient is in today for follow up on chronic medical concerns including hypertension, hyperlipidemia, hypothroidism, back pain and more. She feels well. She did have Covid testing and it was negative and her respiratory symptoms have resolved. Denies CP/palp/SOB/HA/congestion/fevers/GI or GU c/o. Taking meds as prescribed  Past Medical History:  Diagnosis Date   Abnormal nuclear stress test 08/23/2015   Arthritis    Chicken pox as a child   Essential hypertension 07/17/2011   ACEI d/c 06/2011 for refractory cough> resolved    GERD (gastroesophageal reflux disease)    Hyperlipidemia    Hypertension    Hypothyroidism    Insomnia 11/01/2014   Low back pain radiating to right leg    intermittent and occasional numbness of skin over right hip in certain positions   Measles as a child   Mumps as a child   Obesity    Pneumonia    this October from which she has said inhailer   Recurrent epistaxis 02/20/2012   Rheumatoid arthritis ([place]) 09/10/2011   Bone on bone in knees Pain in L>R knees.    RLS (restless legs syndrome) 11/01/2014    Past Surgical History:  Procedure Laterality Date   CARDIAC CATHETERIZATION N/A 08/23/2015   Procedure: Left Heart Cath and Coronary Angiography;  Surgeon: [name], MD;  Location: [facility];  Service: Cardiovascular;  Laterality: N/A;   CESAREAN SECTION     X 3   GASTRECTOMY  12/2017   INGUINAL HERNIA REPAIR Right 68 yrs old   THYROIDECTOMY     total for benign tumor, Parathyroid spared   TONSILLECTOMY     TOTAL HIP ARTHROPLASTY Right 2006   secondary to congenital  hip defect    Family History  Problem Relation Age of Onset   Lung cancer Mother 22       smoker   Heart Problems Mother        tachycardia   Hearing loss Mother    Anxiety disorder Mother        anxiety, claustrophobia   Osteoarthritis Father    Hyperlipidemia Father    Anuerysm Father        AAA   Hyperlipidemia Sister    Migraines Sister    Hypertension Sister    Colon cancer Maternal Grandmother    Heart attack Maternal Grandfather    Aneurysm Paternal Grandfather        abdominal   Heart disease Paternal Grandfather        AAA rupture, smoker   Heart Problems Sister        tachycardia    Social History   Socioeconomic History   Marital status: Married    Spouse name: Not on file   Number of children: 3   Years of education: Not on file  Highest education level: Not on file  Occupational History   Occupation: Training and development officer   Occupation: Investment banker, corporate strain: Not on file   Food insecurity:    Worry: Not on file    Inability: Not on file   Transportation needs:    Medical: Not on file    Non-medical: Not on file  Tobacco Use   Smoking status: Former Smoker    Packs/day: 0.50    Years: 5.00    Pack years: 2.50    Types: Cigarettes    Last attempt to quit: 07/25/1971    Years since quitting: 47.4   Smokeless tobacco: Never Used  Substance and Sexual Activity   Alcohol use: Not Currently    Comment: rarely    Drug use: No   Sexual activity: Never    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  Lifestyle   Physical activity:    Days per week: Not on file    Minutes per session: Not on  file   Stress: Not on file  Relationships   Social connections:    Talks on phone: Not on file    Gets together: Not on file    Attends religious service: Not on file    Active member of club or organization: Not on file    Attends meetings of clubs or organizations: Not on file    Relationship status: Not on file   Intimate partner violence:    Fear of current or ex partner: Not on file    Emotionally abused: Not on file    Physically abused: Not on file    Forced sexual activity: Not on file  Other Topics Concern   Not on file  Social History Narrative   Not on file    Outpatient Medications Prior to Visit  Medication Sig Dispense Refill   albuterol (PROAIR HFA) 108 (90 Base) MCG/ACT inhaler Inhale 2 puffs into the lungs every 6 (six) hours as needed for wheezing or shortness of breath. 8.5 Inhaler 6   aspirin EC 81 MG tablet Take 1 tablet (81 mg total) by mouth daily.     benzonatate (TESSALON) 100 MG capsule Take 1 capsule (100 mg total) by mouth 2 (two) times daily as needed for cough. (Patient not taking: Reported on 11/29/2018) 30 capsule 0   cetirizine (ZYRTEC) 10 MG tablet TAKE 1 TABLET BY MOUTH DAILY 90 tablet 1   conjugated estrogens (PREMARIN) vaginal cream Place 1 Applicatorful vaginally daily. 42.5 g 3   Cyanocobalamin (VITAMIN B-12) 1000 MCG SUBL Place under the tongue.     diclofenac sodium (VOLTAREN) 1 % GEL Apply 3 grams to 3 large joints up to 3 times daily 3 Tube 3   famotidine (PEPCID) 20 MG tablet TAKE 1 TABLET(20 MG) BY MOUTH AT BEDTIME 90 tablet 0   fluticasone (FLONASE) 50 MCG/ACT nasal spray SHAKE LIQUID AND USE 2 SPRAYS IN EACH NOSTRIL DAILY 48 g 2   furosemide (LASIX) 20 MG tablet TAKE 2 TABLETS BY MOUTH DAILY FOR 5 DAYS THEN DECREASE TO 1 TABLET BY MOUTH DAILY AS NEEDED 180 tablet 0   levothyroxine (SYNTHROID, LEVOTHROID) 100 MCG tablet TAKE 1 TABLET BY MOUTH DAILY BEFORE BREAKFAST 90 tablet 0   MELATONIN PO Take 1 tablet by mouth at  bedtime as needed (sleep).     nitroGLYCERIN (NITROSTAT) 0.4 MG SL tablet Place 1 tablet (0.4 mg total) under the tongue every 5 (five) minutes as needed for chest pain. (  Patient not taking: Reported on 11/29/2018) 25 tablet 3   pantoprazole (PROTONIX) 40 MG tablet TAKE 1 TABLET BY MOUTH DAILY 90 tablet 1   potassium chloride SA (K-DUR,KLOR-CON) 20 MEQ tablet TAKE 1 TABLET BY MOUTH DAILY AS NEEDED(DEPENDING ON LASIX USE) 90 tablet 3   rOPINIRole (REQUIP) 0.5 MG tablet TAKE 1 TO 2 TABLETS BY MOUTH AT BEDTIME AS NEEDED 180 tablet 1   SENNA PO Take by mouth daily.     topiramate (TOPAMAX) 50 MG tablet TK 1/2 T PO AT DINNER FOR 1 WEEK THEN INCREASE TO A FULL TABLET THEREAFTER     zolpidem (AMBIEN) 10 MG tablet TAKE 1 TABLET(10 MG) BY MOUTH AT BEDTIME AS NEEDED FOR SLEEP 30 tablet 0   Cholecalciferol (VITAMIN D3) 1000 units CAPS Take 1 capsule (1,000 Units total) daily by mouth. (Patient taking differently: Take 2 capsules by mouth daily. ) 90 capsule 0   hydrochlorothiazide (HYDRODIURIL) 25 MG tablet TAKE 1 TABLET BY MOUTH DAILY 90 tablet 0   metoprolol tartrate (LOPRESSOR) 50 MG tablet TAKE 1 TABLET BY MOUTH TWICE DAILY 180 tablet 0   simvastatin (ZOCOR) 40 MG tablet TAKE 1 TABLET BY MOUTH EVERY EVENING 90 tablet 0   traMADol (ULTRAM) 50 MG tablet TAKE 1 TO 2 TABLETS(50 TO 100 MG) BY MOUTH EVERY 8 HOURS AS NEEDED 90 tablet 0   No facility-administered medications prior to visit.     Allergies  Allergen Reactions   Bee Venom Anaphylaxis   Accupril [Quinapril Hcl] Cough   Quinapril Cough   Quinapril-Hydrochlorothiazide Cough    Review of Systems  Constitutional: Negative for fever and malaise/fatigue.  HENT: Negative for congestion.   Eyes: Negative for blurred vision.  Respiratory: Negative for shortness of breath.   Cardiovascular: Negative for chest pain, palpitations and leg swelling.  Gastrointestinal: Negative for abdominal pain, blood in stool and nausea.    Genitourinary: Negative for dysuria and frequency.  Musculoskeletal: Positive for back pain and joint pain. Negative for falls.  Skin: Negative for rash.  Neurological: Negative for dizziness, loss of consciousness and headaches.  Endo/Heme/Allergies: Negative for environmental allergies.  Psychiatric/Behavioral: Negative for depression. The patient is not nervous/anxious.        Objective:    Physical Exam Constitutional:      Appearance: Normal appearance. She is not ill-appearing.  HENT:     Head: Normocephalic and atraumatic.     Nose: Nose normal.  Pulmonary:     Effort: Pulmonary effort is normal.  Neurological:     Mental Status: She is alert and oriented to person, place, and time.  Psychiatric:        Mood and Affect: Mood normal.        Behavior: Behavior normal.     Wt 255 lb (115.7 kg)    BMI 46.64 kg/m  Wt Readings from Last 3 Encounters:  12/17/18 255 lb (115.7 kg)  11/29/18 256 lb 1.9 oz (116.2 kg)  10/15/18 245 lb (111.1 kg)    Diabetic Foot Exam - Simple   No data filed     Lab Results  Component Value Date   WBC 9.1 11/29/2018   HGB 13.8 11/29/2018   HCT 41.8 11/29/2018   PLT 356 11/29/2018   GLUCOSE 88 05/14/2018   CHOL 177 05/14/2018   TRIG 178.0 (H) 05/14/2018   HDL 52.60 05/14/2018   LDLCALC 89 05/14/2018   ALT 14 05/14/2018   AST 13 05/14/2018   NA 141 05/14/2018   K 4.5 05/14/2018  CL 102 05/14/2018   CREATININE 0.73 05/14/2018   BUN 21 05/14/2018   CO2 33 (H) 05/14/2018   TSH 1.27 05/14/2018   INR 0.99 08/19/2015    Lab Results  Component Value Date   TSH 1.27 05/14/2018   Lab Results  Component Value Date   WBC 9.1 11/29/2018   HGB 13.8 11/29/2018   HCT 41.8 11/29/2018   MCV 94.4 11/29/2018   PLT 356 11/29/2018   Lab Results  Component Value Date   NA 141 05/14/2018   K 4.5 05/14/2018   CHLORIDE 103 12/21/2016   CO2 33 (H) 05/14/2018   GLUCOSE 88 05/14/2018   BUN 21 05/14/2018   CREATININE 0.73 05/14/2018    BILITOT 0.6 05/14/2018   ALKPHOS 96 05/14/2018   AST 13 05/14/2018   ALT 14 05/14/2018   PROT 6.1 05/14/2018   ALBUMIN 3.8 05/14/2018   CALCIUM 9.6 05/14/2018   ANIONGAP 12 (H) 12/21/2017   EGFR 73 (L) 12/21/2016   GFR 84.30 05/14/2018   Lab Results  Component Value Date   CHOL 177 05/14/2018   Lab Results  Component Value Date   HDL 52.60 05/14/2018   Lab Results  Component Value Date   LDLCALC 89 05/14/2018   Lab Results  Component Value Date   TRIG 178.0 (H) 05/14/2018   Lab Results  Component Value Date   CHOLHDL 3 05/14/2018   No results found for: HGBA1C     Assessment & Plan:   Problem List Items Addressed This Visit    Hypothyroidism    On Levothyroxine, continue to monitor      Relevant Medications   metoprolol tartrate (LOPRESSOR) 50 MG tablet   Hyperlipidemia    Encouraged heart healthy diet, increase exercise, avoid trans fats, consider a krill oil cap daily, tolerating statin      Relevant Medications   hydrochlorothiazide (HYDRODIURIL) 25 MG tablet   simvastatin (ZOCOR) 40 MG tablet   metoprolol tartrate (LOPRESSOR) 50 MG tablet   Essential hypertension    Encouraged to measure BP, pulse, pulse ox weekly and report any concerning symptoms or numbers      Relevant Medications   hydrochlorothiazide (HYDRODIURIL) 25 MG tablet   simvastatin (ZOCOR) 40 MG tablet   metoprolol tartrate (LOPRESSOR) 50 MG tablet   Thoracic back pain    Given a refill on Tramadol and may use up to twice daily as she is no longer taking Advil or Tylenol      Relevant Medications   traMADol (ULTRAM) 50 MG tablet   Insomnia    Encouraged good sleep hygiene such as dark, quiet room. No blue/green glowing lights such as computer screens in bedroom. No alcohol or stimulants in evening. Cut down on caffeine as able. Regular exercise is helpful but not just prior to bed time. Refill given on Ambien prn      URI (upper respiratory infection)    Symptoms resolved ane  her covid antibody testing was negative         I have changed Dellamae C. Surratt's Vitamin D3, hydrochlorothiazide, simvastatin, and metoprolol tartrate. I am also having her maintain her aspirin EC, nitroGLYCERIN, MELATONIN PO, potassium chloride SA, Vitamin B-12, furosemide, fluticasone, diclofenac sodium, conjugated estrogens, SENNA PO, topiramate, zolpidem, pantoprazole, albuterol, benzonatate, levothyroxine, famotidine, cetirizine, rOPINIRole, and traMADol.  Meds ordered this encounter  Medications   traMADol (ULTRAM) 50 MG tablet    Sig: TAKE 1 TO 2 TABLETS(50 TO 100 MG) BY MOUTH EVERY 8 HOURS AS NEEDED  Dispense:  90 tablet    Refill:  0   Cholecalciferol (VITAMIN D3) 25 MCG (1000 UT) CAPS    Sig: Take 1 capsule (1,000 Units total) by mouth daily.    Dispense:  90 capsule    Refill:  0   hydrochlorothiazide (HYDRODIURIL) 25 MG tablet    Sig: Take 1 tablet (25 mg total) by mouth daily.    Dispense:  90 tablet    Refill:  1   simvastatin (ZOCOR) 40 MG tablet    Sig: Take 1 tablet (40 mg total) by mouth every evening.    Dispense:  90 tablet    Refill:  1   metoprolol tartrate (LOPRESSOR) 50 MG tablet    Sig: Take 1 tablet (50 mg total) by mouth 2 (two) times daily.    Dispense:  180 tablet    Refill:  1    I discussed the assessment and treatment plan with the patient. The patient was provided an opportunity to ask questions and all were answered. The patient agreed with the plan and demonstrated an understanding of the instructions.   The patient was advised to call back or seek an in-person evaluation if the symptoms worsen or if the condition fails to improve as anticipated.  I provided 25 minutes of non-face-to-face time during this encounter.   Penni Homans, MD

## 2018-12-17 NOTE — Assessment & Plan Note (Signed)
Encouraged good sleep hygiene such as dark, quiet room. No blue/green glowing lights such as computer screens in bedroom. No alcohol or stimulants in evening. Cut down on caffeine as able. Regular exercise is helpful but not just prior to bed time. Refill given on Ambien prn

## 2018-12-17 NOTE — Assessment & Plan Note (Signed)
Symptoms resolved ane her covid antibody testing was negative

## 2018-12-17 NOTE — Assessment & Plan Note (Signed)
Encouraged to measure BP, pulse, pulse ox weekly and report any concerning symptoms or numbers

## 2018-12-17 NOTE — Assessment & Plan Note (Signed)
Encouraged heart healthy diet, increase exercise, avoid trans fats, consider a krill oil cap daily, tolerating statin

## 2018-12-17 NOTE — Assessment & Plan Note (Signed)
Given a refill on Tramadol and may use up to twice daily as she is no longer taking Advil or Tylenol

## 2018-12-17 NOTE — Assessment & Plan Note (Signed)
On Levothyroxine, continue to monitor 

## 2018-12-24 ENCOUNTER — Other Ambulatory Visit: Payer: Self-pay | Admitting: Family Medicine

## 2019-01-02 DIAGNOSIS — M1712 Unilateral primary osteoarthritis, left knee: Secondary | ICD-10-CM | POA: Diagnosis not present

## 2019-01-02 DIAGNOSIS — M25562 Pain in left knee: Secondary | ICD-10-CM | POA: Diagnosis not present

## 2019-01-02 DIAGNOSIS — G5601 Carpal tunnel syndrome, right upper limb: Secondary | ICD-10-CM | POA: Diagnosis not present

## 2019-01-17 ENCOUNTER — Telehealth: Payer: Self-pay

## 2019-01-17 MED ORDER — PREMARIN 0.625 MG/GM VA CREA: TOPICAL_CREAM | VAGINAL | 3 refills | Status: DC

## 2019-01-17 NOTE — Telephone Encounter (Signed)
Sent new instructions to pharmacy

## 2019-01-17 NOTE — Telephone Encounter (Signed)
She has just forgotten. She is does not need the tube. Just a dab on her finger and then apply to the mucosa of the labia once to twice a week is usually enough and minimizes doses.

## 2019-01-17 NOTE — Telephone Encounter (Signed)
Copied from [place] [id number]. Topic: Quick Communication - Rx Refill/Question >> Jan 17, 2019  8:50 AM Erick Blinks wrote: Medication: conjugated estrogens (PREMARIN) vaginal cream -needs specific instructions for grams to insert. Tube has multiple marks pt is confused. Tube is calibrated in 0.5 gram increments with maximum of 2.0 grams   [facility]   [phone]

## 2019-01-31 ENCOUNTER — Inpatient Hospital Stay: Payer: Medicare Other | Admitting: Family

## 2019-01-31 ENCOUNTER — Inpatient Hospital Stay: Payer: Medicare Other

## 2019-02-07 ENCOUNTER — Other Ambulatory Visit: Payer: Medicare Other

## 2019-02-07 ENCOUNTER — Ambulatory Visit: Payer: Medicare Other | Admitting: Family

## 2019-02-07 ENCOUNTER — Other Ambulatory Visit: Payer: Self-pay | Admitting: Family Medicine

## 2019-02-20 ENCOUNTER — Encounter: Payer: Self-pay | Admitting: Family

## 2019-02-20 ENCOUNTER — Telehealth: Payer: Self-pay | Admitting: Family

## 2019-02-20 ENCOUNTER — Inpatient Hospital Stay: Payer: Medicare Other | Attending: Family

## 2019-02-20 ENCOUNTER — Other Ambulatory Visit: Payer: Self-pay

## 2019-02-20 ENCOUNTER — Inpatient Hospital Stay (HOSPITAL_BASED_OUTPATIENT_CLINIC_OR_DEPARTMENT_OTHER): Payer: Medicare Other | Admitting: Family

## 2019-02-20 VITALS — BP 125/71 | HR 68 | Temp 97.5°F | Resp 18 | Ht 62.0 in | Wt 260.0 lb

## 2019-02-20 DIAGNOSIS — D473 Essential (hemorrhagic) thrombocythemia: Secondary | ICD-10-CM

## 2019-02-20 DIAGNOSIS — D509 Iron deficiency anemia, unspecified: Secondary | ICD-10-CM

## 2019-02-20 DIAGNOSIS — Z9884 Bariatric surgery status: Secondary | ICD-10-CM | POA: Insufficient documentation

## 2019-02-20 DIAGNOSIS — R7989 Other specified abnormal findings of blood chemistry: Secondary | ICD-10-CM

## 2019-02-20 DIAGNOSIS — D75839 Thrombocytosis, unspecified: Secondary | ICD-10-CM

## 2019-02-20 DIAGNOSIS — Z7982 Long term (current) use of aspirin: Secondary | ICD-10-CM | POA: Insufficient documentation

## 2019-02-20 DIAGNOSIS — D72829 Elevated white blood cell count, unspecified: Secondary | ICD-10-CM

## 2019-02-20 DIAGNOSIS — Z79899 Other long term (current) drug therapy: Secondary | ICD-10-CM | POA: Diagnosis not present

## 2019-02-20 DIAGNOSIS — D72828 Other elevated white blood cell count: Secondary | ICD-10-CM | POA: Diagnosis not present

## 2019-02-20 DIAGNOSIS — D508 Other iron deficiency anemias: Secondary | ICD-10-CM

## 2019-02-20 LAB — CBC WITH DIFFERENTIAL (CANCER CENTER ONLY)
Abs Immature Granulocytes: 0.03 10*3/uL (ref 0.00–0.07)
Basophils Absolute: 0.1 10*3/uL (ref 0.0–0.1)
Basophils Relative: 1 %
Eosinophils Absolute: 0.1 10*3/uL (ref 0.0–0.5)
Eosinophils Relative: 1 %
HCT: 45.3 % (ref 36.0–46.0)
Hemoglobin: 15 g/dL (ref 12.0–15.0)
Immature Granulocytes: 0 %
Lymphocytes Relative: 23 %
Lymphs Abs: 2.3 10*3/uL (ref 0.7–4.0)
MCH: 30.9 pg (ref 26.0–34.0)
MCHC: 33.1 g/dL (ref 30.0–36.0)
MCV: 93.4 fL (ref 80.0–100.0)
Monocytes Absolute: 0.7 10*3/uL (ref 0.1–1.0)
Monocytes Relative: 7 %
Neutro Abs: 6.7 10*3/uL (ref 1.7–7.7)
Neutrophils Relative %: 68 %
Platelet Count: 389 10*3/uL (ref 150–400)
RBC: 4.85 MIL/uL (ref 3.87–5.11)
RDW: 12.5 % (ref 11.5–15.5)
WBC Count: 9.9 10*3/uL (ref 4.0–10.5)
nRBC: 0 % (ref 0.0–0.2)

## 2019-02-20 LAB — RETICULOCYTES
Immature Retic Fract: 13.2 % (ref 2.3–15.9)
RBC.: 4.84 MIL/uL (ref 3.87–5.11)
Retic Count, Absolute: 107 10*3/uL (ref 19.0–186.0)
Retic Ct Pct: 2.2 % (ref 0.4–3.1)

## 2019-02-20 NOTE — Telephone Encounter (Signed)
Called and spoke with patient regarding appointments per 7/30 los

## 2019-02-20 NOTE — Progress Notes (Signed)
Hematology and Oncology Follow Up Visit  Rebecca Brooks [id number] Dec 19, 1950 68 y.o. 02/20/2019   Principle Diagnosis:  Reactive leukocytosis Iron deficiency anemia  Thrombocytosis  Current Therapy:   IV iron as indicated    Interim History:  Rebecca Brooks is here today for follow-up. She is doing well and has no complaints at this time.  She has not noted any blood loss. No bruising or petechiae.  She has had no issue with infections. No fever, chills, n/v, cough, rash, dizziness, SOB, chest pain, palpitations, abdominal pain or changes in bowel or bladder habits.  No swelling, tenderness, numbness or tingling in her extremities.  She has maintained a good appetite and is staying well hydrated. Her weight is stable.   ECOG Performance Status: 0 - Asymptomatic  Medications:  Allergies as of 02/20/2019      Reactions   Bee Venom Anaphylaxis   Accupril [quinapril Hcl] Cough   Quinapril Cough   Quinapril-hydrochlorothiazide Cough      Medication List       Accurate as of February 20, 2019  2:53 PM. If you have any questions, ask your nurse or doctor.        albuterol 108 (90 Base) MCG/ACT inhaler Commonly known as: ProAir HFA Inhale 2 puffs into the lungs every 6 (six) hours as needed for wheezing or shortness of breath.   aspirin EC 81 MG tablet Take 1 tablet (81 mg total) by mouth daily.   benzonatate 100 MG capsule Commonly known as: TESSALON Take 1 capsule (100 mg total) by mouth 2 (two) times daily as needed for cough.   cetirizine 10 MG tablet Commonly known as: ZYRTEC TAKE 1 TABLET BY MOUTH DAILY   diclofenac sodium 1 % Gel Commonly known as: VOLTAREN Apply 3 grams to 3 large joints up to 3 times daily   famotidine 20 MG tablet Commonly known as: PEPCID TAKE 1 TABLET(20 MG) BY MOUTH AT BEDTIME   fluticasone 50 MCG/ACT nasal spray Commonly known as: FLONASE SHAKE LIQUID AND USE 2 SPRAYS IN EACH NOSTRIL DAILY   furosemide 20 MG tablet Commonly known as:  LASIX TAKE 2 TABLETS BY MOUTH DAILY FOR 5 DAYS THEN DECREASE TO 1 TABLET BY MOUTH DAILY AS NEEDED   hydrochlorothiazide 25 MG tablet Commonly known as: HYDRODIURIL Take 1 tablet (25 mg total) by mouth daily.   levothyroxine 100 MCG tablet Commonly known as: SYNTHROID TAKE 1 TABLET BY MOUTH DAILY BEFORE BREAKFAST   MELATONIN PO Take 1 tablet by mouth at bedtime as needed (sleep).   metoprolol tartrate 50 MG tablet Commonly known as: LOPRESSOR Take 1 tablet (50 mg total) by mouth 2 (two) times daily.   nitroGLYCERIN 0.4 MG SL tablet Commonly known as: NITROSTAT Place 1 tablet (0.4 mg total) under the tongue every 5 (five) minutes as needed for chest pain.   pantoprazole 40 MG tablet Commonly known as: PROTONIX TAKE 1 TABLET BY MOUTH DAILY   potassium chloride SA 20 MEQ tablet Commonly known as: K-DUR TAKE 1 TABLET BY MOUTH DAILY AS NEEDED(DEPENDING ON LASIX USE)   Premarin vaginal cream Generic drug: conjugated estrogens Apply small amount pea size topically on vagina twice a week   rOPINIRole 0.5 MG tablet Commonly known as: REQUIP TAKE 1 TO 2 TABLETS BY MOUTH AT BEDTIME AS NEEDED   SENNA PO Take by mouth daily.   simvastatin 40 MG tablet Commonly known as: ZOCOR Take 1 tablet (40 mg total) by mouth every evening.   topiramate 50 MG  tablet Commonly known as: TOPAMAX TK 1/2 T PO AT DINNER FOR 1 WEEK THEN INCREASE TO A FULL TABLET THEREAFTER   traMADol 50 MG tablet Commonly known as: ULTRAM TAKE 1 TO 2 TABLETS(50 TO 100 MG) BY MOUTH EVERY 8 HOURS AS NEEDED   Vitamin B-12 1000 MCG Subl Place under the tongue.   Vitamin D3 25 MCG (1000 UT) Caps TAKE 1 CAPSULE BY MOUTH EVERY DAY   zolpidem 10 MG tablet Commonly known as: AMBIEN TAKE 1 TABLET(10 MG) BY MOUTH AT BEDTIME AS NEEDED FOR SLEEP       Allergies:  Allergies  Allergen Reactions  . Bee Venom Anaphylaxis  . Accupril [Quinapril Hcl] Cough  . Quinapril Cough  . Quinapril-Hydrochlorothiazide Cough     Past Medical History, Surgical history, Social history, and Family History were reviewed and updated.  Review of Systems: Brooks other 10 point review of systems is negative.   Physical Exam:  vitals were not taken for this visit.   Wt Readings from Last 3 Encounters:  12/17/18 255 lb (115.7 kg)  11/29/18 256 lb 1.9 oz (116.2 kg)  10/15/18 245 lb (111.1 kg)    Ocular: Sclerae unicteric, pupils equal, round and reactive to light Ear-nose-throat: Oropharynx clear, dentition fair Lymphatic: No cervical or supraclavicular adenopathy Lungs no rales or rhonchi, good excursion bilaterally Heart regular rate and rhythm, no murmur appreciated Abd soft, nontender, positive bowel sounds, no liver or spleen tip palpated on exam, no fluid wave  MSK no focal spinal tenderness, no joint edema Neuro: non-focal, well-oriented, appropriate affect Breasts: Deferred   Lab Results  Component Value Date   WBC 9.9 02/20/2019   HGB 15.0 02/20/2019   HCT 45.3 02/20/2019   MCV 93.4 02/20/2019   PLT 389 02/20/2019   Lab Results  Component Value Date   FERRITIN 294 11/29/2018   IRON 95 11/29/2018   TIBC 283 11/29/2018   UIBC 188 11/29/2018   IRONPCTSAT 33 11/29/2018   Lab Results  Component Value Date   RETICCTPCT 2.2 02/20/2019   RBC 4.84 02/20/2019   RBC 4.85 02/20/2019   No results found for: KPAFRELGTCHN, LAMBDASER, KAPLAMBRATIO No results found for: IGGSERUM, IGA, IGMSERUM No results found for: Kathrynn Ducking, MSPIKE, SPEI   Chemistry      Component Value Date/Time   NA 141 05/14/2018 1424   NA 143 06/22/2017 1311   NA 142 12/21/2016 0922   K 4.5 05/14/2018 1424   K 3.1 (L) 06/22/2017 1311   K 3.5 12/21/2016 0922   CL 102 05/14/2018 1424   CL 99 06/22/2017 1311   CO2 33 (H) 05/14/2018 1424   CO2 32 06/22/2017 1311   CO2 30 (H) 12/21/2016 0922   BUN 21 05/14/2018 1424   BUN 21 06/22/2017 1311   BUN 22.4 12/21/2016 0922    CREATININE 0.73 05/14/2018 1424   CREATININE 0.76 12/21/2017 1327   CREATININE 0.9 06/22/2017 1311   CREATININE 0.8 12/21/2016 0922      Component Value Date/Time   CALCIUM 9.6 05/14/2018 1424   CALCIUM 8.9 06/22/2017 1311   CALCIUM 9.2 12/21/2016 0922   ALKPHOS 96 05/14/2018 1424   ALKPHOS 99 (H) 06/22/2017 1311   ALKPHOS 114 12/21/2016 0922   AST 13 05/14/2018 1424   AST 16 12/21/2017 1327   AST 15 12/21/2016 0922   ALT 14 05/14/2018 1424   ALT 17 12/21/2017 1327   ALT 19 06/22/2017 1311   ALT 13 12/21/2016 0922  BILITOT 0.6 05/14/2018 1424   BILITOT 0.5 12/21/2017 1327   BILITOT 0.29 12/21/2016 9432       Impression and Plan: Rebecca Brooks is a very pleasant 68 yo caucasian female with mild reactive leukocytosis with arthritis and iron deficiency secondary to malabsorption 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  Her Hgb looks great at 15.0. Iron studies are pending.  We will plan to see her back in another 3 months.  She will contact our office with nay questions or concerns. We can certainly see her sooner if needed.   Laverna Peace, NP 7/30/20202:53 PM

## 2019-02-21 LAB — IRON AND TIBC
Iron: 111 ug/dL (ref 41–142)
Saturation Ratios: 34 % (ref 21–57)
TIBC: 329 ug/dL (ref 236–444)
UIBC: 217 ug/dL (ref 120–384)

## 2019-02-21 LAB — FERRITIN: Ferritin: 360 ng/mL — ABNORMAL HIGH (ref 11–307)

## 2019-02-24 ENCOUNTER — Other Ambulatory Visit: Payer: Self-pay | Admitting: Family Medicine

## 2019-03-07 NOTE — Progress Notes (Signed)
Office Visit Note  Patient: Rebecca Brooks             Date of Birth: [birth year]           MRN: [id number]             PCP: Mosie Lukes, MD Referring: Mosie Lukes, MD Visit Date: 03/21/2019 Occupation: @GUAROCC @  Subjective:  Pain in both knees   History of Present Illness: Rebecca Brooks is a 68 y.o. female with history of osteoarthritis and DDD.  She presents today with pain in both knee joints, worse in the right knee joint.  She has right knee warmth and swelling.  She states she has recently gained weight, but she plans on trying to lose weight in order to get her BMI below 40 to have a knee replacement.  She has been experiencing intermittent right shoulder joint pain.  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 She has been taking tramadol 1 tablet every morning.    Activities of Daily Living:  Patient reports morning stiffness for 45 minutes.   Patient Reports nocturnal pain.  Difficulty dressing/grooming: Denies Difficulty climbing stairs: Reports Difficulty getting out of chair: Reports Difficulty using hands for taps, buttons, cutlery, and/or writing: Reports  Review of Systems  Constitutional: Negative for fatigue.  HENT: Negative for mouth sores, trouble swallowing, trouble swallowing, mouth dryness and nose dryness.   Eyes: Negative for itching and dryness.  Respiratory: Negative for shortness of breath, wheezing and difficulty breathing.   Cardiovascular: Negative for chest pain and palpitations.  Gastrointestinal: Negative for blood in stool, constipation and diarrhea.  Endocrine: Negative for increased urination.  Genitourinary: Negative for difficulty urinating and painful urination.  Musculoskeletal: Positive for arthralgias, joint pain, joint swelling and morning stiffness.  Skin: Negative for rash and redness.  Allergic/Immunologic: Negative for susceptible to infections.  Neurological: Negative for dizziness,  light-headedness, headaches, memory loss and weakness.  Hematological: Negative for bruising/bleeding tendency.  Psychiatric/Behavioral: Negative for confusion. The patient is not nervous/anxious.     PMFS History:  Patient Active Problem List   Diagnosis Date Noted  . Exposure to Covid-19 Virus 10/16/2018  . URI (upper respiratory infection) 10/16/2018  . Atrophic vaginitis 05/14/2018  . OSA (obstructive sleep apnea) 04/26/2018  . Muscle cramps 01/02/2018  . IDA (iron deficiency anemia) 12/22/2016  . Primary osteoarthritis of both knees 10/03/2016  . DDD (degenerative disc disease), cervical 10/03/2016  . DDD (degenerative disc disease), lumbar 10/03/2016  . History of total hip replacement, right 10/03/2016  . History of hypothyroidism 10/03/2016  . History of hypertension 10/03/2016  . History of anemia 10/03/2016  . Chondromalacia 10/03/2016  . Primary osteoarthritis of shoulder 10/03/2016  . Primary osteoarthritis of both hands 10/03/2016  . Abnormal nuclear stress test 08/23/2015  . Globus sensation 08/13/2015  . Special screening for malignant neoplasms, colon 08/13/2015  . Chest pain at rest   . Chest pain with moderate risk of acute coronary syndrome 07/28/2015  . Annual physical exam 07/11/2015  . RLS (restless legs syndrome) 11/01/2014  . Insomnia 11/01/2014  . Thoracic back pain 01/12/2014  . Allergic rhinitis 11/03/2013  . Preventative health care 12/13/2012  . Recurrent epistaxis 02/20/2012  . Shoulder pain, bilateral 11/01/2011  . Arthralgia of both knees 10/18/2011  . Peripheral edema 09/10/2011  . Asthma 08/19/2011  . Palpitations 08/01/2011  . Anemia 08/01/2011  . Hypokalemia 08/01/2011  . Elevated WBC count 08/01/2011  . GERD (gastroesophageal reflux  disease)   . Low back pain radiating to right leg   . Morbid obesity ([place]) 07/17/2011  . Hypothyroidism 07/17/2011  . Hyperlipidemia 07/17/2011  . Essential hypertension 07/17/2011    Past Medical  History:  Diagnosis Date  . Abnormal nuclear stress test 08/23/2015  . Arthritis   . Chicken pox as a child  . Essential hypertension 07/17/2011   ACEI d/c 06/2011 for refractory cough> resolved   . GERD (gastroesophageal reflux disease)   . Hyperlipidemia   . Hypertension   . Hypothyroidism   . Insomnia 11/01/2014  . Low back pain radiating to right leg    intermittent and occasional numbness of skin over right hip in certain positions  . Measles as a child  . Mumps as a child  . Obesity   . Pneumonia    this October from which she has said inhailer  . Recurrent epistaxis 02/20/2012  . Rheumatoid arthritis ([place]) 09/10/2011   Bone on bone in knees Pain in L>R knees.   . RLS (restless legs syndrome) 11/01/2014    Family History  Problem Relation Age of Onset  . Lung cancer Mother 71       smoker  . Heart Problems Mother        tachycardia  . Hearing loss Mother   . Anxiety disorder Mother        anxiety, claustrophobia  . Osteoarthritis Father   . Hyperlipidemia Father   . Anuerysm Father        AAA  . Hyperlipidemia Sister   . Migraines Sister   . Hypertension Sister   . Colon cancer Maternal Grandmother   . Heart attack Maternal Grandfather   . Aneurysm Paternal Grandfather        abdominal  . Heart disease Paternal Grandfather        AAA rupture, smoker  . Heart Problems Sister        tachycardia   Past Surgical History:  Procedure Laterality Date  . CARDIAC CATHETERIZATION N/A 08/23/2015   Procedure: Left Heart Cath and Coronary Angiography;  Surgeon: [name], MD;  Location: [facility];  Service: Cardiovascular;  Laterality: N/A;  . CESAREAN SECTION     X 3  . GASTRECTOMY  12/2017  . INGUINAL HERNIA REPAIR Right 68 yrs old  . THYROIDECTOMY     total for benign tumor, Parathyroid spared  . TONSILLECTOMY    . TOTAL HIP ARTHROPLASTY Right 2006   secondary to congenital  hip defect   Social History   Social History Narrative  . Not on file    Immunization History  Administered Date(s) Administered  . Influenza Split 07/19/2012  . Influenza Whole 06/24/2011, 05/13/2013  . Influenza, High Dose Seasonal PF 06/22/2017, 05/14/2018  . Influenza,inj,Quad PF,6+ Mos 06/03/2016  . Influenza-Unspecified 05/07/2014, 06/08/2015  . Pneumococcal Conjugate-13 05/14/2018  . Pneumococcal Polysaccharide-23 07/18/2011  . Td 07/24/2005  . Tdap 08/01/2011  . Zoster 07/23/2012     Objective: Vital Signs: BP 135/78 (BP Location: Right Wrist, Patient Position: Sitting, Cuff Size: Normal)   Pulse 75   Resp 20   Ht 5' 2.25" (1.581 m)   Wt 271 lb 3.2 oz (123 kg)   BMI 49.21 kg/m    Physical Exam Vitals signs and nursing note reviewed.  Constitutional:      Appearance: She is well-developed.  HENT:     Head: Normocephalic and atraumatic.  Eyes:     Conjunctiva/sclera: Conjunctivae normal.  Neck:  Musculoskeletal: Normal range of motion.  Cardiovascular:     Rate and Rhythm: Normal rate and regular rhythm.     Heart sounds: Normal heart sounds.  Pulmonary:     Effort: Pulmonary effort is normal.     Breath sounds: Normal breath sounds.  Abdominal:     General: Bowel sounds are normal.     Palpations: Abdomen is soft.  Lymphadenopathy:     Cervical: No cervical adenopathy.  Skin:    General: Skin is warm and dry.     Capillary Refill: Capillary refill takes less than 2 seconds.  Neurological:     Mental Status: She is alert and oriented to person, place, and time.  Psychiatric:        Behavior: Behavior normal.      Musculoskeletal Exam: C-spine, thoracic spine, and lumbar spine good ROM.  No midline spinal tenderness.  No SI joint tenderness. Shoulder joints, elbow joints, wrist joints, MCPs, PIPs, and DIPs good ROM with no synovitis.  Severe PIP and DIP synovial thickening consistent with osteoarthritis of both hands.  CMC joint synovial thickening. Hip joints, knee joints, ankle joints, MTPs, PIPs, and DIPs good ROM with no  synovitis.  Right knee warmth but no effusion.  No tenderness or swelling of ankle joints.   CDAI Exam: CDAI Score: - Patient Global: -; Provider Global: - Swollen: -; Tender: - Joint Exam   No joint exam has been documented for this visit   There is currently no information documented on the homunculus. Go to the Rheumatology activity and complete the homunculus joint exam.  Investigation: No additional findings.  Imaging: No results found.  Recent Labs: Lab Results  Component Value Date   WBC 9.9 02/20/2019   HGB 15.0 02/20/2019   PLT 389 02/20/2019   NA 141 05/14/2018   K 4.5 05/14/2018   CL 102 05/14/2018   CO2 33 (H) 05/14/2018   GLUCOSE 88 05/14/2018   BUN 21 05/14/2018   CREATININE 0.73 05/14/2018   BILITOT 0.6 05/14/2018   ALKPHOS 96 05/14/2018   AST 13 05/14/2018   ALT 14 05/14/2018   PROT 6.1 05/14/2018   ALBUMIN 3.8 05/14/2018   CALCIUM 9.6 05/14/2018   GFRAA >60 12/21/2017    Speciality Comments: No specialty comments available.  Procedures:  Large Joint Inj: bilateral knee on 03/21/2019 11:41 AM Indications: pain Details: 27 G 1.5 in needle, medial approach  Arthrogram: No  Medications (Right): 1.5 mL lidocaine 1 %; 40 mg triamcinolone acetonide 40 MG/ML Aspirate (Right): 0 mL Medications (Left): 1.5 mL lidocaine 1 %; 40 mg triamcinolone acetonide 40 MG/ML Aspirate (Left): 0 mL Outcome: tolerated well, no immediate complications Procedure, treatment alternatives, risks and benefits explained, specific risks discussed. Consent was given by the patient. Immediately prior to procedure a time out was called to verify the correct patient, procedure, equipment, support staff and site/side marked as required. Patient was prepped and draped in the usual sterile fashion.     Allergies: Bee venom, Accupril [quinapril hcl], Quinapril, and Quinapril-hydrochlorothiazide   Assessment / Plan:     Visit Diagnoses: Primary osteoarthritis of both hands: She has  severe PIP and DIP synovial thickening consistent with osteoarthritis.  She has PIP and DIP synovial thickening consistent with osteoarthritis of both hands. Hand exercises were discussed.  Joint protection and muscle strengthening were discussed.   Primary osteoarthritis of both knees - s/p synvisc bilateral knees 06/2018: She presents today with increased pain in both knee joints.  She has good  ROM of both knee joints.  She has right knee warmth but no effusion.  She requested bilateral knee cortisone injections today.  She tolerated the procedure well.  Procedure note completed above.  We will apply for visco gel injections for both knee joints.  We discussed the importance of regular exercise and weight loss.  She would like to get her BMI below 40 to have a knee replacement in the future.   Primary osteoarthritis of both shoulders: She has limited abduction of both shoulder joints.  She has been experiencing increased discomfort in the right shoulder.  She declined a cortisone injection today.   History of total hip replacement, right: Doing well.  She has good ROM with no discomfort.   DDD (degenerative disc disease), cervical: She has good ROM with no discomfort at this time.  No symptoms of radiculopathy.    DDD (degenerative disc disease), lumbar: She has occasional lower back pain.  She has no midline spinal tenderness at this time.   Primary insomnia - She takes Ambien as needed at bedtime.  Other medical conditions are listed as follows:   Peripheral edema  History of hypothyroidism  History of hypertension  History of anemia  History of high cholesterol  History of obesity  Orders: Orders Placed This Encounter  Procedures  . Large Joint Inj   No orders of the defined types were placed in this encounter.   Face-to-face time spent with patient was 30 minutes. Greater than 50% of time was spent in counseling and coordination of care.  Follow-Up Instructions: Return in  about 6 months (around 09/21/2019) for Osteoarthritis, DDD.   Ofilia Neas, PA-C   I examined and evaluated the patient with Hazel Sams PA.  Patient has osteoarthritis involving multiple joints.  She was having significant pain in her bilateral knee joints today.  The cortisone injection was given to bilateral knee joints per her request.  She tolerated the procedure well.  We will also apply for Visco supplement injections.  The plan of care was discussed as noted above.  Bo Merino, MD  Note - This record has been created using Editor, commissioning.  Chart creation errors have been sought, but may not always  have been located. Such creation errors do not reflect on  the standard of medical care.

## 2019-03-16 ENCOUNTER — Encounter: Payer: Self-pay | Admitting: Family Medicine

## 2019-03-21 ENCOUNTER — Ambulatory Visit: Payer: Medicare Other | Admitting: Rheumatology

## 2019-03-21 ENCOUNTER — Other Ambulatory Visit: Payer: Self-pay

## 2019-03-21 ENCOUNTER — Encounter: Payer: Self-pay | Admitting: Rheumatology

## 2019-03-21 ENCOUNTER — Telehealth: Payer: Self-pay

## 2019-03-21 VITALS — BP 135/78 | HR 75 | Resp 20 | Ht 62.25 in | Wt 271.2 lb

## 2019-03-21 DIAGNOSIS — M19011 Primary osteoarthritis, right shoulder: Secondary | ICD-10-CM | POA: Diagnosis not present

## 2019-03-21 DIAGNOSIS — Z96641 Presence of right artificial hip joint: Secondary | ICD-10-CM

## 2019-03-21 DIAGNOSIS — M19042 Primary osteoarthritis, left hand: Secondary | ICD-10-CM

## 2019-03-21 DIAGNOSIS — Z8639 Personal history of other endocrine, nutritional and metabolic disease: Secondary | ICD-10-CM

## 2019-03-21 DIAGNOSIS — M19041 Primary osteoarthritis, right hand: Secondary | ICD-10-CM

## 2019-03-21 DIAGNOSIS — M5136 Other intervertebral disc degeneration, lumbar region: Secondary | ICD-10-CM

## 2019-03-21 DIAGNOSIS — M5030 Other cervical disc degeneration, unspecified cervical region: Secondary | ICD-10-CM

## 2019-03-21 DIAGNOSIS — Z862 Personal history of diseases of the blood and blood-forming organs and certain disorders involving the immune mechanism: Secondary | ICD-10-CM

## 2019-03-21 DIAGNOSIS — Z8679 Personal history of other diseases of the circulatory system: Secondary | ICD-10-CM

## 2019-03-21 DIAGNOSIS — R609 Edema, unspecified: Secondary | ICD-10-CM

## 2019-03-21 DIAGNOSIS — M170 Bilateral primary osteoarthritis of knee: Secondary | ICD-10-CM | POA: Diagnosis not present

## 2019-03-21 DIAGNOSIS — M19012 Primary osteoarthritis, left shoulder: Secondary | ICD-10-CM

## 2019-03-21 DIAGNOSIS — F5101 Primary insomnia: Secondary | ICD-10-CM

## 2019-03-21 MED ORDER — TRIAMCINOLONE ACETONIDE 40 MG/ML IJ SUSP
40.0000 mg | INTRAMUSCULAR | Status: AC | PRN
  Administered 2019-03-21: 40 mg via INTRA_ARTICULAR

## 2019-03-21 MED ORDER — LIDOCAINE HCL 1 % IJ SOLN
1.5000 mL | INTRAMUSCULAR | Status: AC | PRN
  Administered 2019-03-21: 1.5 mL

## 2019-03-21 NOTE — Telephone Encounter (Signed)
Please apply for bilateral knee visco, per Taylor Dale, PA-C. Thanks!  

## 2019-03-23 ENCOUNTER — Encounter: Payer: Self-pay | Admitting: Family Medicine

## 2019-03-26 NOTE — Telephone Encounter (Signed)
Please call patient to schedule a series of Synvisc injections with Hazel Sams, PAC, Bilateral Knees, Buy and Bill. Insurance will cover 80%.

## 2019-03-30 ENCOUNTER — Other Ambulatory Visit: Payer: Self-pay | Admitting: Family Medicine

## 2019-04-01 ENCOUNTER — Ambulatory Visit: Payer: Self-pay | Admitting: Rheumatology

## 2019-04-09 ENCOUNTER — Telehealth: Payer: Self-pay

## 2019-04-09 NOTE — Telephone Encounter (Signed)
Recall cologuard letter sent to patients home address on file.  

## 2019-04-10 ENCOUNTER — Encounter: Payer: Self-pay | Admitting: Family Medicine

## 2019-04-17 ENCOUNTER — Other Ambulatory Visit: Payer: Self-pay

## 2019-04-17 ENCOUNTER — Ambulatory Visit: Payer: Medicare Other | Admitting: Rheumatology

## 2019-04-17 DIAGNOSIS — M170 Bilateral primary osteoarthritis of knee: Secondary | ICD-10-CM | POA: Diagnosis not present

## 2019-04-17 MED ORDER — HYLAN G-F 20 16 MG/2ML IX SOSY
16.0000 mg | PREFILLED_SYRINGE | INTRA_ARTICULAR | Status: AC | PRN
  Administered 2019-04-17: 16 mg via INTRA_ARTICULAR

## 2019-04-17 MED ORDER — LIDOCAINE HCL 1 % IJ SOLN
1.5000 mL | INTRAMUSCULAR | Status: AC | PRN
  Administered 2019-04-17: 1.5 mL

## 2019-04-17 NOTE — Progress Notes (Signed)
   Procedure Note  Patient: Rebecca Brooks             Date of Birth: 1951/04/14           MRN: [id number]             Visit Date: 04/17/2019  Procedures: Visit Diagnoses:  1. Primary osteoarthritis of both knees    Synvisc #1 Bilateral knee joint injections B/B  Large Joint Inj: bilateral knee on 04/17/2019 8:11 AM Indications: pain Details: 25 G 1.5 in needle, medial approach  Arthrogram: No  Medications (Right): 16 mg Hylan 16 MG/2ML; 1.5 mL lidocaine 1 % Aspirate (Right): 0 mL Medications (Left): 16 mg Hylan 16 MG/2ML; 1.5 mL lidocaine 1 % Aspirate (Left): 0 mL Outcome: tolerated well, no immediate complications Procedure, treatment alternatives, risks and benefits explained, specific risks discussed. Consent was given by the patient. Immediately prior to procedure a time out was called to verify the correct patient, procedure, equipment, support staff and site/side marked as required. Patient was prepped and draped in the usual sterile fashion.    This patient is diagnosed with osteoarthritis of the knee(s).    Radiographs show evidence of joint space narrowing, osteophytes, subchondral sclerosis and/or subchondral cysts.  This patient has knee pain which interferes with functional and activities of daily living.    This patient has experienced inadequate response, adverse effects and/or intolerance with conservative treatments such as acetaminophen, NSAIDS, topical creams, physical therapy or regular exercise, knee bracing and/or weight loss.   This patient has experienced inadequate response or has a contraindication to intra articular steroid injections for at least 3 months.   This patient is not scheduled to have a total knee replacement within 6 months of starting treatment with viscosupplementation. Bo Merino, MD

## 2019-04-18 ENCOUNTER — Ambulatory Visit: Payer: Medicare Other | Admitting: Rheumatology

## 2019-04-19 ENCOUNTER — Ambulatory Visit (INDEPENDENT_AMBULATORY_CARE_PROVIDER_SITE_OTHER): Payer: Medicare Other

## 2019-04-19 ENCOUNTER — Other Ambulatory Visit: Payer: Self-pay

## 2019-04-19 DIAGNOSIS — Z23 Encounter for immunization: Secondary | ICD-10-CM | POA: Diagnosis not present

## 2019-04-23 ENCOUNTER — Ambulatory Visit: Payer: Self-pay

## 2019-04-23 ENCOUNTER — Encounter: Payer: Self-pay | Admitting: Family Medicine

## 2019-04-23 NOTE — Telephone Encounter (Signed)
Outgoing call to patient who complains of Vaginal bleeding since this morning. 0845am Has mild discomfort  Sensation of mild discomfort . Sensation of ovulating.   Lower back ache. Also. DR.  Blythe not in the office today.  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  Patient states that she will call her GYN off first.  If no availability  Will call us be for an appointment.            Rebecca Brooks Female, 68 y.o., June 11, 1951 MRN:  [id number] Phone:  [phone] [name]) PCP:  Mosie Lukes, MD Primary Cvg:  Sherre Poot [facility] Medicare/[employer] Medicare Next Appt With Rheumatology 04/25/2019 at 10:45 AM Message from Rayann Heman sent at 04/23/2019 9:12 AM EDT  Summary: vaginal bleeding    Pt states that she is having having vaginal bleeding and has not had a period is 18 years. Please advise         Call History   Type Contact Phone User  04/23/2019 09:11 AM EDT Phone (Incoming) [name], [name] (Self) [phone] Lemmie Evens) Rayann Heman    Reason for Disposition . [1] MILD-MODERATE pain AND [2] present > 24 hours  Answer Assessment - Initial Assessment Questions 1. SYMPTOM: "What's the main symptom you're concerned about?" (e.g., pain, itching, dryness)     Discomfort feels like mild discomfort 2. LOCATION: "Where is the  *No Answer* located?" (e.g., inside/outside, left/right)     *No Answer* 3. ONSET: "When did the  *No Answer*  start?"     08:45 am 4. PAIN: "Is there any pain?" If so, ask: "How bad is it?" (Scale: 1-10; mild, moderate, severe)     mild 5. ITCHING: "Is there any itching?" If so, ask: "How bad is it?" (Scale: 1-10; mild, moderate, severe)     denies 6. CAUSE: "What do you think is causing the discharge?" "Have you had the same problem before? What happened then?"     denies 7. OTHER SYMPTOMS: "Do you have any other symptoms?" (e.g., fever, itching, vaginal bleeding, pain with urination, injury to genital area, vaginal foreign body)     Lower back ache 8.  PREGNANCY: "Is there any chance you are pregnant?" "When was your last menstrual period?"    menopausal  Protocols used: VAGINAL [facility]

## 2019-04-24 NOTE — Telephone Encounter (Signed)
Spoke with patient regarding symptoms.  Patient states vaginal bleeding stopped yesterday however was present again this morning. She states bleeding is not excessive, does report mild cramping and back pain.  Patient states she comfortable waiting for GYN appointment on Monday. Advised if symptoms worsen to call, if excessive bleeding, to go to ER. Patient verbalized understanding.

## 2019-04-25 ENCOUNTER — Ambulatory Visit (INDEPENDENT_AMBULATORY_CARE_PROVIDER_SITE_OTHER): Payer: Medicare Other | Admitting: Rheumatology

## 2019-04-25 ENCOUNTER — Other Ambulatory Visit: Payer: Self-pay

## 2019-04-25 DIAGNOSIS — M170 Bilateral primary osteoarthritis of knee: Secondary | ICD-10-CM | POA: Diagnosis not present

## 2019-04-25 MED ORDER — LIDOCAINE HCL 1 % IJ SOLN
1.5000 mL | INTRAMUSCULAR | Status: AC | PRN
  Administered 2019-04-25: 1.5 mL

## 2019-04-25 MED ORDER — HYLAN G-F 20 16 MG/2ML IX SOSY
16.0000 mg | PREFILLED_SYRINGE | INTRA_ARTICULAR | Status: AC | PRN
  Administered 2019-04-25: 16 mg via INTRA_ARTICULAR

## 2019-04-25 NOTE — Progress Notes (Signed)
   Procedure Note  Patient: Rebecca Brooks             Date of Birth: Feb 24, 1951           MRN: [id number]             Visit Date: 04/25/2019  Procedures: Visit Diagnoses:  1. Primary osteoarthritis of both knees   2. Morbid obesity ([facility]) Chronic   Synvisc #2 Bilateral knee joint injections, B/B   Large Joint Inj: bilateral knee on 04/25/2019 9:01 AM Indications: pain Details: 25 G 1.5 in needle, medial approach  Arthrogram: No  Medications (Right): 16 mg Hylan 16 MG/2ML; 1.5 mL lidocaine 1 % Aspirate (Right): 0 mL Medications (Left): 16 mg Hylan 16 MG/2ML; 1.5 mL lidocaine 1 % Aspirate (Left): 0 mL Outcome: tolerated well, no immediate complications Procedure, treatment alternatives, risks and benefits explained, specific risks discussed. Consent was given by the patient. Immediately prior to procedure a time out was called to verify the correct patient, procedure, equipment, support staff and site/side marked as required. Patient was prepped and draped in the usual sterile fashion.     Bo Merino, MD

## 2019-04-28 DIAGNOSIS — N950 Postmenopausal bleeding: Secondary | ICD-10-CM | POA: Diagnosis not present

## 2019-04-30 ENCOUNTER — Other Ambulatory Visit: Payer: Self-pay | Admitting: Family Medicine

## 2019-05-01 ENCOUNTER — Other Ambulatory Visit: Payer: Self-pay

## 2019-05-01 ENCOUNTER — Ambulatory Visit (INDEPENDENT_AMBULATORY_CARE_PROVIDER_SITE_OTHER): Payer: Medicare Other | Admitting: Rheumatology

## 2019-05-01 ENCOUNTER — Telehealth: Payer: Self-pay | Admitting: Family

## 2019-05-01 DIAGNOSIS — M170 Bilateral primary osteoarthritis of knee: Secondary | ICD-10-CM

## 2019-05-01 MED ORDER — LIDOCAINE HCL 1 % IJ SOLN
1.5000 mL | INTRAMUSCULAR | Status: AC | PRN
  Administered 2019-05-01: 1.5 mL

## 2019-05-01 MED ORDER — HYLAN G-F 20 16 MG/2ML IX SOSY
16.0000 mg | PREFILLED_SYRINGE | INTRA_ARTICULAR | Status: AC | PRN
  Administered 2019-05-01: 16 mg via INTRA_ARTICULAR

## 2019-05-01 NOTE — Progress Notes (Signed)
   Procedure Note  Patient: Rebecca Brooks             Date of Birth: 1951-04-18           MRN: [id number]             Visit Date: 05/01/2019  Procedures: Visit Diagnoses:  1. Primary osteoarthritis of both knees    Synvisc #3 bilateral knee joint injections B/B  Large Joint Inj: bilateral knee on 05/01/2019 8:29 AM Indications: pain Details: 25 G 1.5 in needle, medial approach  Arthrogram: No  Medications (Right): 16 mg Hylan 16 MG/2ML; 1.5 mL lidocaine 1 % Aspirate (Right): 0 mL Medications (Left): 16 mg Hylan 16 MG/2ML; 1.5 mL lidocaine 1 % Aspirate (Left): 0 mL Outcome: tolerated well, no immediate complications Procedure, treatment alternatives, risks and benefits explained, specific risks discussed. Consent was given by the patient. Immediately prior to procedure a time out was called to verify the correct patient, procedure, equipment, support staff and site/side marked as required. Patient was prepped and draped in the usual sterile fashion.     Bo Merino, MD

## 2019-05-01 NOTE — Telephone Encounter (Signed)
Called and spoke with patient regarding appointment date/time change due to provider PAL 10/30

## 2019-05-02 ENCOUNTER — Ambulatory Visit: Payer: Medicare Other | Admitting: Rheumatology

## 2019-05-03 ENCOUNTER — Other Ambulatory Visit: Payer: Self-pay | Admitting: Family Medicine

## 2019-05-04 ENCOUNTER — Other Ambulatory Visit: Payer: Self-pay | Admitting: Physician Assistant

## 2019-05-05 NOTE — Telephone Encounter (Signed)
RF request for Tramadol  LOV: 12/17/2018 Next ov: Not scheduled  Last written: 12/17/2018 #90 x0 refills   Please advise

## 2019-05-05 NOTE — Telephone Encounter (Signed)
Last Visit: 03/21/19 Next Visit: 09/16/19  Okay to refill per Dr. Estanislado Pandy

## 2019-05-07 ENCOUNTER — Other Ambulatory Visit: Payer: Self-pay | Admitting: Family Medicine

## 2019-05-17 ENCOUNTER — Other Ambulatory Visit: Payer: Self-pay

## 2019-05-17 ENCOUNTER — Encounter (HOSPITAL_COMMUNITY): Payer: Self-pay

## 2019-05-17 ENCOUNTER — Ambulatory Visit (HOSPITAL_COMMUNITY)
Admission: EM | Admit: 2019-05-17 | Discharge: 2019-05-17 | Disposition: A | Discharge status: Home / Self Care | Payer: Medicare Other | Attending: Family Medicine | Admitting: Family Medicine

## 2019-05-17 DIAGNOSIS — G8929 Other chronic pain: Secondary | ICD-10-CM | POA: Diagnosis not present

## 2019-05-17 DIAGNOSIS — M25562 Pain in left knee: Secondary | ICD-10-CM | POA: Diagnosis not present

## 2019-05-17 MED ORDER — METHYLPREDNISOLONE ACETATE 80 MG/ML IJ SUSP
INTRAMUSCULAR | Status: AC
  Filled 2019-05-17: qty 1

## 2019-05-17 MED ORDER — BUPIVACAINE HCL (PF) 0.5 % IJ SOLN
INTRAMUSCULAR | Status: AC
  Filled 2019-05-17: qty 10

## 2019-05-17 NOTE — ED Triage Notes (Signed)
Pt present left leg /knee pain. Symptoms started Thursday.

## 2019-05-17 NOTE — ED Provider Notes (Signed)
[place]    CSN: [id number] Arrival date & time: 05/17/19  1221      History   Chief Complaint Chief Complaint  Patient presents with  . Leg Pain    Left     HPI Rebecca Brooks is a 68 y.o. female.   Patient complains of pain in her left knee.  Prior x-rays show severe degenerative disease bone-on-bone.  She needs to have knee replacement but due to weight this has not been done.  She has had injections with viscous solution as well as steroids in the past.  Today she is concerned about infection versus blood clot.  HPI  Past Medical History:  Diagnosis Date  . Abnormal nuclear stress test 08/23/2015  . Arthritis   . Chicken pox as a child  . Essential hypertension 07/17/2011   ACEI d/c 06/2011 for refractory cough> resolved   . GERD (gastroesophageal reflux disease)   . Hyperlipidemia   . Hypertension   . Hypothyroidism   . Insomnia 11/01/2014  . Low back pain radiating to right leg    intermittent and occasional numbness of skin over right hip in certain positions  . Measles as a child  . Mumps as a child  . Obesity   . Pneumonia    this October from which she has said inhailer  . Recurrent epistaxis 02/20/2012  . Rheumatoid arthritis ([place]) 09/10/2011   Bone on bone in knees Pain in L>R knees.   . RLS (restless legs syndrome) 11/01/2014    Patient Active Problem List   Diagnosis Date Noted  . Exposure to COVID-19 virus 10/16/2018  . URI (upper respiratory infection) 10/16/2018  . Atrophic vaginitis 05/14/2018  . OSA (obstructive sleep apnea) 04/26/2018  . Muscle cramps 01/02/2018  . IDA (iron deficiency anemia) 12/22/2016  . Primary osteoarthritis of both knees 10/03/2016  . DDD (degenerative disc disease), cervical 10/03/2016  . DDD (degenerative disc disease), lumbar 10/03/2016  . History of total hip replacement, right 10/03/2016  . History of hypothyroidism 10/03/2016  . History of hypertension 10/03/2016  . History of anemia 10/03/2016  .  Chondromalacia 10/03/2016  . Primary osteoarthritis of shoulder 10/03/2016  . Primary osteoarthritis of both hands 10/03/2016  . Abnormal nuclear stress test 08/23/2015  . Globus sensation 08/13/2015  . Special screening for malignant neoplasms, colon 08/13/2015  . Chest pain at rest   . Chest pain with moderate risk of acute coronary syndrome 07/28/2015  . Annual physical exam 07/11/2015  . RLS (restless legs syndrome) 11/01/2014  . Insomnia 11/01/2014  . Thoracic back pain 01/12/2014  . Allergic rhinitis 11/03/2013  . Preventative health care 12/13/2012  . Recurrent epistaxis 02/20/2012  . Shoulder pain, bilateral 11/01/2011  . Arthralgia of both knees 10/18/2011  . Peripheral edema 09/10/2011  . Asthma 08/19/2011  . Palpitations 08/01/2011  . Anemia 08/01/2011  . Hypokalemia 08/01/2011  . Elevated WBC count 08/01/2011  . GERD (gastroesophageal reflux disease)   . Low back pain radiating to right leg   . Morbid obesity ([place]) 07/17/2011  . Hypothyroidism 07/17/2011  . Hyperlipidemia 07/17/2011  . Essential hypertension 07/17/2011    Past Surgical History:  Procedure Laterality Date  . CARDIAC CATHETERIZATION N/A 08/23/2015   Procedure: Left Heart Cath and Coronary Angiography;  Surgeon: [name], MD;  Location: [facility];  Service: Cardiovascular;  Laterality: N/A;  . CESAREAN SECTION     X 3  . GASTRECTOMY  12/2017  . INGUINAL HERNIA REPAIR Right 68  yrs old  . THYROIDECTOMY     total for benign tumor, Parathyroid spared  . TONSILLECTOMY    . TOTAL HIP ARTHROPLASTY Right 2006   secondary to congenital  hip defect    OB History   No obstetric history on file.      Home Medications    Prior to Admission medications   Medication Sig Start Date End Date Taking? Authorizing Provider  albuterol (PROAIR HFA) 108 (90 Base) MCG/ACT inhaler Inhale 2 puffs into the lungs every 6 (six) hours as needed for wheezing or shortness of breath. 10/15/18   Mosie Lukes, MD  aspirin EC 81 MG tablet Take 1 tablet (81 mg total) by mouth daily. 07/29/15   Elgergawy, Silver Huguenin, MD  benzonatate (TESSALON) 100 MG capsule Take 1 capsule (100 mg total) by mouth 2 (two) times daily as needed for cough. 10/15/18   Mosie Lukes, MD  cetirizine (ZYRTEC) 10 MG tablet TAKE 1 TABLET BY MOUTH DAILY 11/29/18   Mosie Lukes, MD  Cholecalciferol (VITAMIN D3) 25 MCG (1000 UT) CAPS TAKE 1 CAPSULE BY MOUTH EVERY DAY 12/25/18   Mosie Lukes, MD  conjugated estrogens (PREMARIN) vaginal cream Apply small amount pea size topically on vagina twice a week 01/17/19   Mosie Lukes, MD  Cyanocobalamin (VITAMIN B-12) 1000 MCG SUBL Place under the tongue. 07/20/17   [provider]  diclofenac sodium (VOLTAREN) 1 % GEL APPLY 2-4 GRAMS TOPICALLY TO AFFECTED JOINT UP TO 4 TIMES DAILY 05/05/19   Bo Merino, MD  famotidine (PEPCID) 20 MG tablet TAKE 1 TABLET(20 MG) BY MOUTH AT BEDTIME 02/25/19   Mosie Lukes, MD  fluticasone (FLONASE) 50 MCG/ACT nasal spray SHAKE LIQUID AND USE 2 SPRAYS IN [facility] NOSTRIL DAILY 04/01/19   Mosie Lukes, MD  furosemide (LASIX) 20 MG tablet TAKE 2 TABLETS BY MOUTH DAILY FOR 5 DAYS THEN DECREASE TO 1 TABLET BY MOUTH DAILY AS NEEDED 01/07/18   Mosie Lukes, MD  hydrochlorothiazide (HYDRODIURIL) 25 MG tablet Take 1 tablet (25 mg total) by mouth daily. 12/17/18   Mosie Lukes, MD  levothyroxine (SYNTHROID) 100 MCG tablet TAKE 1 TABLET BY MOUTH DAILY BEFORE BREAKFAST. 05/07/19   Mosie Lukes, MD  MELATONIN PO Take 1 tablet by mouth at bedtime as needed (sleep).    [provider]  metoprolol tartrate (LOPRESSOR) 50 MG tablet Take 1 tablet (50 mg total) by mouth 2 (two) times daily. 12/17/18   Mosie Lukes, MD  nitroGLYCERIN (NITROSTAT) 0.4 MG SL tablet Place 1 tablet (0.4 mg total) under the tongue every 5 (five) minutes as needed for chest pain. 08/05/15   Mosie Lukes, MD  pantoprazole (PROTONIX) 40 MG tablet TAKE 1 TABLET BY  MOUTH DAILY 04/30/19   Mosie Lukes, MD  potassium chloride SA (K-DUR,KLOR-CON) 20 MEQ tablet TAKE 1 TABLET BY MOUTH DAILY AS NEEDED(DEPENDING ON LASIX USE) 11/27/16   Mosie Lukes, MD  rOPINIRole (REQUIP) 0.5 MG tablet TAKE 1 TO 2 TABLETS BY MOUTH AT BEDTIME AS NEEDED 11/29/18   Mosie Lukes, MD  SENNA PO Take by mouth daily.    [provider]  simvastatin (ZOCOR) 40 MG tablet Take 1 tablet (40 mg total) by mouth every evening. 12/17/18   Mosie Lukes, MD  topiramate (TOPAMAX) 50 MG tablet TK 1/2 T PO AT DINNER FOR 1 WEEK THEN INCREASE TO A FULL TABLET THEREAFTER 08/13/18   [provider]  traMADol Veatrice Bourbon)  50 MG tablet TAKE 1 TO 2 TABLETS(50 TO 100 MG) BY MOUTH EVERY 8 HOURS AS NEEDED 05/05/19   Mosie Lukes, MD  zolpidem (AMBIEN) 10 MG tablet TAKE 1 TABLET(10 MG) BY MOUTH AT BEDTIME AS NEEDED FOR SLEEP 10/01/18   Mosie Lukes, MD  budesonide-formoterol [facility]) 160-4.5 MCG/ACT inhaler Inhale 2 puffs into the lungs 2 (two) times daily as needed. Patient has sample, is not using 08/29/11 10/18/11  Mosie Lukes, MD    Family History Family History  Problem Relation Age of Onset  . Lung cancer Mother 18       smoker  . Heart Problems Mother        tachycardia  . Hearing loss Mother   . Anxiety disorder Mother        anxiety, claustrophobia  . Osteoarthritis Father   . Hyperlipidemia Father   . Anuerysm Father        AAA  . Hyperlipidemia Sister   . Migraines Sister   . Hypertension Sister   . Colon cancer Maternal Grandmother   . Heart attack Maternal Grandfather   . Aneurysm Paternal Grandfather        abdominal  . Heart disease Paternal Grandfather        AAA rupture, smoker  . Heart Problems Sister        tachycardia    Social History Social History   Tobacco Use  . Smoking status: Former Smoker    Packs/day: 0.50    Years: 5.00    Pack years: 2.50    Types: Cigarettes    Quit date: 07/25/1971    Years since quitting: 47.8  . Smokeless  tobacco: Never Used  Substance Use Topics  . Alcohol use: Yes    Comment: OCC  . Drug use: No     Allergies   Bee venom, Accupril [quinapril hcl], Quinapril, and Quinapril-hydrochlorothiazide   Review of Systems Review of Systems  Musculoskeletal: Positive for arthralgias and gait problem.  All other systems reviewed and are negative.    Physical Exam Triage Vital Signs ED Triage Vitals  Enc Vitals Group     BP 05/17/19 1300 139/68     Pulse Rate 05/17/19 1300 73     Resp 05/17/19 1300 18     Temp 05/17/19 1300 98 F (36.7 C)     Temp Source 05/17/19 1300 Oral     SpO2 05/17/19 1300 100 %     Weight --      Height --      Head Circumference --      Peak Flow --      Pain Score 05/17/19 1301 6     Pain Loc --      Pain Edu? --      Excl. in [place]? --    No data found.  Updated Vital Signs BP 139/68 (BP Location: Left Arm)   Pulse 73   Temp 98 F (36.7 C) (Oral)   Resp 18   SpO2 100%   Visual Acuity Right Eye Distance:   Left Eye Distance:   Bilateral Distance:    Right Eye Near:   Left Eye Near:    Bilateral Near:     Physical Exam Vitals signs and nursing note reviewed.  Constitutional:      Appearance: Normal appearance. She is obese.  Musculoskeletal:     Comments: Left knee: There is no gross effusion erythema or increased temp in the joint. We discussed options for treatment  and decided on injection since by history previous injection 1 month ago did not reach joint space. Joint injected easily with Depo-Medrol and Marcaine.  This was tolerated well      UC Treatments / Results  Labs (all labs ordered are listed, but only abnormal results are displayed) Labs Reviewed - No data to display  EKG   Radiology No results found.  Procedures Procedures (including critical care time)  Medications Ordered in UC Medications  methylPREDNISolone acetate (DEPO-MEDROL) 80 MG/ML injection (has no administration in time range)  bupivacaine  (MARCAINE) 0.5 % injection (has no administration in time range)    Initial Impression / Assessment and Plan / UC Course  I have reviewed the triage vital signs and the nursing notes.  Pertinent labs & imaging results that were available during my care of the patient were reviewed by me and considered in my medical decision making (see chart for details).     Severe degenerative arthritis Final Clinical Impressions(s) / UC Diagnoses   Final diagnoses:  Chronic pain of left knee     Discharge Instructions     For knee pain, try glucosamine 500 to 1000 mg twice a day, Tylenol arthritis strength 1000 mg twice a day and use tramadol as needed.   ED Prescriptions    None     PDMP not reviewed this encounter.   Wardell Honour, MD 05/17/19 [phone]

## 2019-05-17 NOTE — Discharge Instructions (Addendum)
For knee pain, try glucosamine 500 to 1000 mg twice a day, Tylenol arthritis strength 1000 mg twice a day and use tramadol as needed.

## 2019-05-22 ENCOUNTER — Other Ambulatory Visit: Payer: Medicare Other

## 2019-05-22 ENCOUNTER — Ambulatory Visit: Payer: Medicare Other | Admitting: Family

## 2019-05-23 ENCOUNTER — Ambulatory Visit: Payer: Medicare Other | Admitting: Family

## 2019-05-23 ENCOUNTER — Other Ambulatory Visit: Payer: Medicare Other

## 2019-05-24 ENCOUNTER — Other Ambulatory Visit: Payer: Self-pay | Admitting: Family Medicine

## 2019-05-30 ENCOUNTER — Other Ambulatory Visit: Payer: Self-pay

## 2019-05-30 ENCOUNTER — Encounter: Payer: Self-pay | Admitting: Family

## 2019-05-30 ENCOUNTER — Inpatient Hospital Stay: Payer: Medicare Other | Attending: Family

## 2019-05-30 ENCOUNTER — Inpatient Hospital Stay (HOSPITAL_BASED_OUTPATIENT_CLINIC_OR_DEPARTMENT_OTHER): Payer: Medicare Other | Admitting: Family

## 2019-05-30 VITALS — BP 133/73 | HR 66 | Temp 97.1°F | Resp 19 | Wt 268.0 lb

## 2019-05-30 DIAGNOSIS — D75839 Thrombocytosis, unspecified: Secondary | ICD-10-CM

## 2019-05-30 DIAGNOSIS — D473 Essential (hemorrhagic) thrombocythemia: Secondary | ICD-10-CM | POA: Insufficient documentation

## 2019-05-30 DIAGNOSIS — D509 Iron deficiency anemia, unspecified: Secondary | ICD-10-CM | POA: Insufficient documentation

## 2019-05-30 DIAGNOSIS — D72828 Other elevated white blood cell count: Secondary | ICD-10-CM | POA: Diagnosis not present

## 2019-05-30 DIAGNOSIS — D72829 Elevated white blood cell count, unspecified: Secondary | ICD-10-CM

## 2019-05-30 DIAGNOSIS — Z79899 Other long term (current) drug therapy: Secondary | ICD-10-CM | POA: Diagnosis not present

## 2019-05-30 DIAGNOSIS — D508 Other iron deficiency anemias: Secondary | ICD-10-CM

## 2019-05-30 DIAGNOSIS — K912 Postsurgical malabsorption, not elsewhere classified: Secondary | ICD-10-CM | POA: Diagnosis not present

## 2019-05-30 LAB — CBC WITH DIFFERENTIAL (CANCER CENTER ONLY)
Abs Immature Granulocytes: 0.05 10*3/uL (ref 0.00–0.07)
Basophils Absolute: 0.1 10*3/uL (ref 0.0–0.1)
Basophils Relative: 1 %
Eosinophils Absolute: 0.2 10*3/uL (ref 0.0–0.5)
Eosinophils Relative: 1 %
HCT: 42.9 % (ref 36.0–46.0)
Hemoglobin: 14 g/dL (ref 12.0–15.0)
Immature Granulocytes: 0 %
Lymphocytes Relative: 19 %
Lymphs Abs: 2.3 10*3/uL (ref 0.7–4.0)
MCH: 30.3 pg (ref 26.0–34.0)
MCHC: 32.6 g/dL (ref 30.0–36.0)
MCV: 92.9 fL (ref 80.0–100.0)
Monocytes Absolute: 0.7 10*3/uL (ref 0.1–1.0)
Monocytes Relative: 6 %
Neutro Abs: 8.9 10*3/uL — ABNORMAL HIGH (ref 1.7–7.7)
Neutrophils Relative %: 73 %
Platelet Count: 400 10*3/uL (ref 150–400)
RBC: 4.62 MIL/uL (ref 3.87–5.11)
RDW: 12.3 % (ref 11.5–15.5)
WBC Count: 12.2 10*3/uL — ABNORMAL HIGH (ref 4.0–10.5)
nRBC: 0 % (ref 0.0–0.2)

## 2019-05-30 LAB — RETICULOCYTES
Immature Retic Fract: 10.6 % (ref 2.3–15.9)
RBC.: 4.64 MIL/uL (ref 3.87–5.11)
Retic Count, Absolute: 73.8 10*3/uL (ref 19.0–186.0)
Retic Ct Pct: 1.6 % (ref 0.4–3.1)

## 2019-05-30 NOTE — Progress Notes (Signed)
Hematology and Oncology Follow Up Visit  Rebecca Brooks [id number] [birth year] 68 y.o. 05/30/2019   Principle Diagnosis:  Reactive leukocytosis Iron deficiency anemia  Thrombocytosis  Current Therapy:   IV iron as indicated   Interim History:  Rebecca Brooks is here today for follow-up. She is doing well and has no complaints at this time.  She denies fatigue. She is enjoying painting and crocheting.  No episodes of bleeding. No bruising or petechiae.  No issue with infections. No fever, chills, n/v, cough, rash, dizziness, SOB, chest pain, palpitations, abdominal pain or changes in bowel or bladder habits.  She still has issues with urinary urgency at times. She also saw her gynecologist for this and vaginal bleeding which was successfully treated with premarin cream.  No swelling, tenderness, numbness or tingling in her extremities.  No falls or syncope.  She has maintained a good appetite and is staying well hydrated. Her weight is stable.   ECOG Performance Status: 0 - Asymptomatic  Medications:  Allergies as of 05/30/2019      Reactions   Bee Venom Anaphylaxis   Accupril [quinapril Hcl] Cough   Quinapril Cough   Quinapril-hydrochlorothiazide Cough      Medication List       Accurate as of May 30, 2019  2:47 PM. If you have any questions, ask your nurse or doctor.        albuterol 108 (90 Base) MCG/ACT inhaler Commonly known as: ProAir HFA Inhale 2 puffs into the lungs every 6 (six) hours as needed for wheezing or shortness of breath.   aspirin EC 81 MG tablet Take 1 tablet (81 mg total) by mouth daily.   benzonatate 100 MG capsule Commonly known as: TESSALON Take 1 capsule (100 mg total) by mouth 2 (two) times daily as needed for cough.   cetirizine 10 MG tablet Commonly known as: ZYRTEC TAKE 1 TABLET BY MOUTH DAILY   diclofenac sodium 1 % Gel Commonly known as: VOLTAREN APPLY 2-4 GRAMS TOPICALLY TO AFFECTED JOINT UP TO 4 TIMES DAILY   famotidine 20  MG tablet Commonly known as: PEPCID TAKE 1 TABLET(20 MG) BY MOUTH AT BEDTIME   fluticasone 50 MCG/ACT nasal spray Commonly known as: FLONASE SHAKE LIQUID AND USE 2 SPRAYS IN EACH NOSTRIL DAILY   furosemide 20 MG tablet Commonly known as: LASIX TAKE 2 TABLETS BY MOUTH DAILY FOR 5 DAYS THEN DECREASE TO 1 TABLET BY MOUTH DAILY AS NEEDED   hydrochlorothiazide 25 MG tablet Commonly known as: HYDRODIURIL TAKE 1 TABLET(25 MG) BY MOUTH DAILY   levothyroxine 100 MCG tablet Commonly known as: SYNTHROID TAKE 1 TABLET BY MOUTH DAILY BEFORE BREAKFAST.   MELATONIN PO Take 1 tablet by mouth at bedtime as needed (sleep).   metoprolol tartrate 50 MG tablet Commonly known as: LOPRESSOR Take 1 tablet (50 mg total) by mouth 2 (two) times daily.   nitroGLYCERIN 0.4 MG SL tablet Commonly known as: NITROSTAT Place 1 tablet (0.4 mg total) under the tongue every 5 (five) minutes as needed for chest pain.   pantoprazole 40 MG tablet Commonly known as: PROTONIX TAKE 1 TABLET BY MOUTH DAILY   potassium chloride SA 20 MEQ tablet Commonly known as: KLOR-CON TAKE 1 TABLET BY MOUTH DAILY AS NEEDED(DEPENDING ON LASIX USE)   Premarin vaginal cream Generic drug: conjugated estrogens Apply small amount pea size topically on vagina twice a week   rOPINIRole 0.5 MG tablet Commonly known as: REQUIP TAKE 1 TO 2 TABLETS BY MOUTH AT BEDTIME AS NEEDED  SENNA PO Take by mouth daily.   simvastatin 40 MG tablet Commonly known as: ZOCOR Take 1 tablet (40 mg total) by mouth every evening.   topiramate 50 MG tablet Commonly known as: TOPAMAX TK 1/2 T PO AT DINNER FOR 1 WEEK THEN INCREASE TO A FULL TABLET THEREAFTER   traMADol 50 MG tablet Commonly known as: ULTRAM TAKE 1 TO 2 TABLETS(50 TO 100 MG) BY MOUTH EVERY 8 HOURS AS NEEDED   Vitamin B-12 1000 MCG Subl Place under the tongue.   Vitamin D3 25 MCG (1000 UT) Caps TAKE 1 CAPSULE BY MOUTH EVERY DAY   zolpidem 10 MG tablet Commonly known as:  AMBIEN TAKE 1 TABLET(10 MG) BY MOUTH AT BEDTIME AS NEEDED FOR SLEEP       Allergies:  Allergies  Allergen Reactions  . Bee Venom Anaphylaxis  . Accupril [Quinapril Hcl] Cough  . Quinapril Cough  . Quinapril-Hydrochlorothiazide Cough    Past Medical History, Surgical history, Social history, and Family History were reviewed and updated.  Review of Systems: All other 10 point review of systems is negative.   Physical Exam:  vitals were not taken for this visit.   Wt Readings from Last 3 Encounters:  03/21/19 271 lb 3.2 oz (123 kg)  02/20/19 260 lb (117.9 kg)  12/17/18 255 lb (115.7 kg)    Ocular: Sclerae unicteric, pupils equal, round and reactive to light Ear-nose-throat: Oropharynx clear, dentition fair Lymphatic: No cervical or supraclavicular adenopathy Lungs no rales or rhonchi, good excursion bilaterally Heart regular rate and rhythm, no murmur appreciated Abd soft, nontender, positive bowel sounds, no liver or spleen tip palpated on exam, no fluid wave  MSK no focal spinal tenderness, no joint edema Neuro: non-focal, well-oriented, appropriate affect Breasts: Deferred   Lab Results  Component Value Date   WBC 12.2 (H) 05/30/2019   HGB 14.0 05/30/2019   HCT 42.9 05/30/2019   MCV 92.9 05/30/2019   PLT 400 05/30/2019   Lab Results  Component Value Date   FERRITIN 360 (H) 02/20/2019   IRON 111 02/20/2019   TIBC 329 02/20/2019   UIBC 217 02/20/2019   IRONPCTSAT 34 02/20/2019   Lab Results  Component Value Date   RETICCTPCT 1.6 05/30/2019   RBC 4.64 05/30/2019   No results found for: KPAFRELGTCHN, LAMBDASER, KAPLAMBRATIO No results found for: IGGSERUM, IGA, IGMSERUM No results found for: Odetta Pink, SPEI   Chemistry      Component Value Date/Time   NA 141 05/14/2018 1424   NA 143 06/22/2017 1311   NA 142 12/21/2016 0922   K 4.5 05/14/2018 1424   K 3.1 (L) 06/22/2017 1311   K 3.5 12/21/2016  0922   CL 102 05/14/2018 1424   CL 99 06/22/2017 1311   CO2 33 (H) 05/14/2018 1424   CO2 32 06/22/2017 1311   CO2 30 (H) 12/21/2016 0922   BUN 21 05/14/2018 1424   BUN 21 06/22/2017 1311   BUN 22.4 12/21/2016 0922   CREATININE 0.73 05/14/2018 1424   CREATININE 0.76 12/21/2017 1327   CREATININE 0.9 06/22/2017 1311   CREATININE 0.8 12/21/2016 0922      Component Value Date/Time   CALCIUM 9.6 05/14/2018 1424   CALCIUM 8.9 06/22/2017 1311   CALCIUM 9.2 12/21/2016 0922   ALKPHOS 96 05/14/2018 1424   ALKPHOS 99 (H) 06/22/2017 1311   ALKPHOS 114 12/21/2016 0922   AST 13 05/14/2018 1424   AST 16 12/21/2017 1327   AST 15  12/21/2016 0922   ALT 14 05/14/2018 1424   ALT 17 12/21/2017 1327   ALT 19 06/22/2017 1311   ALT 13 12/21/2016 0922   BILITOT 0.6 05/14/2018 1424   BILITOT 0.5 12/21/2017 1327   BILITOT 0.29 12/21/2016 [id number]       Impression and Plan: Rebecca Brooks is a very pleasant 68 yo caucasian female withmild reactive leukocytosis with arthritis and iron deficiency secondary to malabsorption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 She continues to do well and has no complaints at this time.  We will see what her iron studies show and bring her back in for infusion if needed.  We will go ahead and plan to see her back in another 6 months.  She will contact our office with any questions or concerns. We can certainly see her sooner if needed.   Laverna Peace, NP 11/6/20202:47 PM

## 2019-06-02 LAB — FERRITIN: Ferritin: 306 ng/mL (ref 11–307)

## 2019-06-02 LAB — IRON AND TIBC
Iron: 78 ug/dL (ref 41–142)
Saturation Ratios: 26 % (ref 21–57)
TIBC: 295 ug/dL (ref 236–444)
UIBC: 217 ug/dL (ref 120–384)

## 2019-06-03 ENCOUNTER — Other Ambulatory Visit: Payer: Self-pay | Admitting: Family Medicine

## 2019-06-06 ENCOUNTER — Other Ambulatory Visit: Payer: Self-pay | Admitting: Family Medicine

## 2019-06-19 ENCOUNTER — Other Ambulatory Visit: Payer: Self-pay | Admitting: Family Medicine

## 2019-07-07 ENCOUNTER — Other Ambulatory Visit: Payer: Self-pay | Admitting: Family Medicine

## 2019-07-07 NOTE — Telephone Encounter (Signed)
Requesting:tramadol  Contract:yes UDS:low risk  Last OV:12/17/18 Next OV:08/18/19 Last Refill:05/05/19  #[id number] Database:   Please advise

## 2019-07-29 DIAGNOSIS — Z13220 Encounter for screening for lipoid disorders: Secondary | ICD-10-CM | POA: Diagnosis not present

## 2019-07-29 DIAGNOSIS — R635 Abnormal weight gain: Secondary | ICD-10-CM | POA: Diagnosis not present

## 2019-07-29 DIAGNOSIS — E569 Vitamin deficiency, unspecified: Secondary | ICD-10-CM | POA: Diagnosis not present

## 2019-07-29 DIAGNOSIS — Z9884 Bariatric surgery status: Secondary | ICD-10-CM | POA: Diagnosis not present

## 2019-08-01 ENCOUNTER — Other Ambulatory Visit: Payer: Self-pay | Admitting: Family Medicine

## 2019-08-09 ENCOUNTER — Other Ambulatory Visit: Payer: Self-pay | Admitting: Family Medicine

## 2019-08-12 ENCOUNTER — Ambulatory Visit: Payer: Medicare Other | Attending: Internal Medicine

## 2019-08-12 ENCOUNTER — Other Ambulatory Visit: Payer: Self-pay

## 2019-08-12 ENCOUNTER — Other Ambulatory Visit: Payer: Self-pay | Admitting: Family Medicine

## 2019-08-12 DIAGNOSIS — Z23 Encounter for immunization: Secondary | ICD-10-CM | POA: Insufficient documentation

## 2019-08-12 NOTE — Progress Notes (Signed)
   Covid-19 Vaccination Clinic  Name:  Rebecca Brooks    MRN: [id number] DOB: 18-Jun-1951  08/12/2019  Ms. Heidtke was observed post Covid-19 immunization for 15 minutes without incidence. She was provided with Vaccine Information Sheet and instruction to access the V-Safe system.   Ms. Provence was instructed to call 911 with any severe reactions post vaccine: Marland Kitchen Difficulty breathing  . Swelling of your face and throat  . A fast heartbeat  . A bad rash all over your body  . Dizziness and weakness    Immunizations Administered    Name Date Dose VIS Date Route   Pfizer COVID-19 Vaccine 08/12/2019  1:30 PM 0.3 mL 07/04/2019 Intramuscular   Manufacturer: [place]   Lot: [id number]   [place]: [id number]

## 2019-08-15 ENCOUNTER — Other Ambulatory Visit: Payer: Self-pay

## 2019-08-18 ENCOUNTER — Ambulatory Visit (INDEPENDENT_AMBULATORY_CARE_PROVIDER_SITE_OTHER): Payer: Medicare Other | Admitting: Family Medicine

## 2019-08-18 ENCOUNTER — Other Ambulatory Visit: Payer: Self-pay

## 2019-08-18 ENCOUNTER — Encounter: Payer: Self-pay | Admitting: Family Medicine

## 2019-08-18 DIAGNOSIS — D508 Other iron deficiency anemias: Secondary | ICD-10-CM

## 2019-08-18 DIAGNOSIS — F5104 Psychophysiologic insomnia: Secondary | ICD-10-CM

## 2019-08-18 DIAGNOSIS — E039 Hypothyroidism, unspecified: Secondary | ICD-10-CM

## 2019-08-18 DIAGNOSIS — I10 Essential (primary) hypertension: Secondary | ICD-10-CM

## 2019-08-18 DIAGNOSIS — E782 Mixed hyperlipidemia: Secondary | ICD-10-CM

## 2019-08-18 MED ORDER — TRAMADOL HCL 50 MG PO TABS: ORAL_TABLET | ORAL | 0 refills | Status: DC

## 2019-08-18 MED ORDER — FAMOTIDINE 20 MG PO TABS: ORAL_TABLET | ORAL | 3 refills | Status: DC

## 2019-08-18 NOTE — Assessment & Plan Note (Signed)
Well controlled, no changes to meds. Encouraged heart healthy diet such as the DASH diet and exercise as tolerated.  °

## 2019-08-18 NOTE — Assessment & Plan Note (Signed)
Encouraged heart healthy diet, increase exercise, avoid trans fats, consider a krill oil cap daily, tolerating statin

## 2019-08-18 NOTE — Patient Instructions (Addendum)
Omron blood pressure cuff, upper arm  Weekly vitals  Multivitamin with minerals, selenium  Vitamin D 2000 IU daily Aspirin EC 81 mg daily Probiotic daily, ALign, PHillips colon health, culturelle  Melatonin 2.5 mg to 10 mg at bedtime COVID-19 COVID-19 is a respiratory infection that is caused by a virus called severe acute respiratory syndrome coronavirus 2 (SARS-CoV-2). The disease is also known as coronavirus disease or novel coronavirus. In some people, the virus may not cause any symptoms. In others, it may cause a serious infection. The infection can get worse quickly and can lead to complications, such as:  Pneumonia, or infection of the lungs.  Acute respiratory distress syndrome or ARDS. This is a condition in which fluid build-up in the lungs prevents the lungs from filling with air and passing oxygen into the blood.  Acute respiratory failure. This is a condition in which there is not enough oxygen passing from the lungs to the body or when carbon dioxide is not passing from the lungs out of the body.  Sepsis or septic shock. This is a serious bodily reaction to an infection.  Blood clotting problems.  Secondary infections due to bacteria or fungus.  Organ failure. This is when your body's organs stop working. The virus that causes COVID-19 is contagious. This means that it can spread from person to person through droplets from coughs and sneezes (respiratory secretions). What are the causes? This illness is caused by a virus. You may catch the virus by:  Breathing in droplets from an infected person. Droplets can be spread by a person breathing, speaking, singing, coughing, or sneezing.  Touching something, like a table or a doorknob, that was exposed to the virus (contaminated) and then touching your mouth, nose, or eyes. What increases the risk? Risk for infection You are more likely to be infected with this virus if you:  Are within 6 feet (2 meters) of a person  with COVID-19.  Provide care for or live with a person who is infected with COVID-19.  Spend time in crowded indoor spaces or live in shared housing. Risk for serious illness You are more likely to become seriously ill from the virus if you:  Are 30 years of age or older. The higher your age, the more you are at risk for serious illness.  Live in a nursing home or long-term care facility.  Have cancer.  Have a long-term (chronic) disease such as: ? Chronic lung disease, including chronic obstructive pulmonary disease or asthma. ? A long-term disease that lowers your body's ability to fight infection (immunocompromised). ? Heart disease, including heart failure, a condition in which the arteries that lead to the heart become narrow or blocked (coronary artery disease), a disease which makes the heart muscle thick, weak, or stiff (cardiomyopathy). ? Diabetes. ? Chronic kidney disease. ? Sickle cell disease, a condition in which red blood cells have an abnormal "sickle" shape. ? Liver disease.  Are obese. What are the signs or symptoms? Symptoms of this condition can range from mild to severe. Symptoms may appear any time from 2 to 14 days after being exposed to the virus. They include:  A fever or chills.  A cough.  Difficulty breathing.  Headaches, body aches, or muscle aches.  Runny or stuffy (congested) nose.  A sore throat.  New loss of taste or smell. Some people may also have stomach problems, such as nausea, vomiting, or diarrhea. Other people may not have any symptoms of COVID-19. How is this  diagnosed? This condition may be diagnosed based on:  Your signs and symptoms, especially if: ? You live in an area with a COVID-19 outbreak. ? You recently traveled to or from an area where the virus is common. ? You provide care for or live with a person who was diagnosed with COVID-19. ? You were exposed to a person who was diagnosed with COVID-19.  A physical  exam.  Lab tests, which may include: ? Taking a sample of fluid from the back of your nose and throat (nasopharyngeal fluid), your nose, or your throat using a swab. ? A sample of mucus from your lungs (sputum). ? Blood tests.  Imaging tests, which may include, X-rays, CT scan, or ultrasound. How is this treated? At present, there is no medicine to treat COVID-19. Medicines that treat other diseases are being used on a trial basis to see if they are effective against COVID-19. Your health care provider will talk with you about ways to treat your symptoms. For most people, the infection is mild and can be managed at home with rest, fluids, and over-the-counter medicines. Treatment for a serious infection usually takes places in a hospital intensive care unit (ICU). It may include one or more of the following treatments. These treatments are given until your symptoms improve.  Receiving fluids and medicines through an IV.  Supplemental oxygen. Extra oxygen is given through a tube in the nose, a face mask, or a hood.  Positioning you to lie on your stomach (prone position). This makes it easier for oxygen to get into the lungs.  Continuous positive airway pressure (CPAP) or bi-level positive airway pressure (BPAP) machine. This treatment uses mild air pressure to keep the airways open. A tube that is connected to a motor delivers oxygen to the body.  Ventilator. This treatment moves air into and out of the lungs by using a tube that is placed in your windpipe.  Tracheostomy. This is a procedure to create a hole in the neck so that a breathing tube can be inserted.  Extracorporeal membrane oxygenation (ECMO). This procedure gives the lungs a chance to recover by taking over the functions of the heart and lungs. It supplies oxygen to the body and removes carbon dioxide. Follow these instructions at home: Lifestyle  If you are sick, stay home except to get medical care. Your health care  provider will tell you how long to stay home. Call your health care provider before you go for medical care.  Rest at home as told by your health care provider.  Do not use any products that contain nicotine or tobacco, such as cigarettes, e-cigarettes, and chewing tobacco. If you need help quitting, ask your health care provider.  Return to your normal activities as told by your health care provider. Ask your health care provider what activities are safe for you. General instructions  Take over-the-counter and prescription medicines only as told by your health care provider.  Drink enough fluid to keep your urine pale yellow.  Keep all follow-up visits as told by your health care provider. This is important. How is this prevented?  There is no vaccine to help prevent COVID-19 infection. However, there are steps you can take to protect yourself and others from this virus. To protect yourself:   Do not travel to areas where COVID-19 is a risk. The areas where COVID-19 is reported change often. To identify high-risk areas and travel restrictions, check the CDC travel website: FatFares.com.br  If you live  in, or must travel to, an area where COVID-19 is a risk, take precautions to avoid infection. ? Stay away from people who are sick. ? Wash your hands often with soap and water for 20 seconds. If soap and water are not available, use an alcohol-based hand sanitizer. ? Avoid touching your mouth, face, eyes, or nose. ? Avoid going out in public, follow guidance from your state and local health authorities. ? If you must go out in public, wear a cloth face covering or face mask. Make sure your mask covers your nose and mouth. ? Avoid crowded indoor spaces. Stay at least 6 feet (2 meters) away from others. ? Disinfect objects and surfaces that are frequently touched every day. This may include:  Counters and tables.  Doorknobs and light switches.  Sinks and  faucets.  Electronics, such as phones, remote controls, keyboards, computers, and tablets. To protect others: If you have symptoms of COVID-19, take steps to prevent the virus from spreading to others.  If you think you have a COVID-19 infection, contact your health care provider right away. Tell your health care team that you think you may have a COVID-19 infection.  Stay home. Leave your house only to seek medical care. Do not use public transport.  Do not travel while you are sick.  Wash your hands often with soap and water for 20 seconds. If soap and water are not available, use alcohol-based hand sanitizer.  Stay away from other members of your household. Let healthy household members care for children and pets, if possible. If you have to care for children or pets, wash your hands often and wear a mask. If possible, stay in your own room, separate from others. Use a different bathroom.  Make sure that all people in your household wash their hands well and often.  Cough or sneeze into a tissue or your sleeve or elbow. Do not cough or sneeze into your hand or into the air.  Wear a cloth face covering or face mask. Make sure your mask covers your nose and mouth. Where to find more information  Centers for Disease Control and Prevention: PurpleGadgets.be  World Health Organization: https://www.castaneda.info/ Contact a health care provider if:  You live in or have traveled to an area where COVID-19 is a risk and you have symptoms of the infection.  You have had contact with someone who has COVID-19 and you have symptoms of the infection. Get help right away if:  You have trouble breathing.  You have pain or pressure in your chest.  You have confusion.  You have bluish lips and fingernails.  You have difficulty waking from sleep.  You have symptoms that get worse. These symptoms may represent a serious problem that is an emergency. Do  not wait to see if the symptoms will go away. Get medical help right away. Call your local emergency services (911 in the [place] Do not drive yourself to the hospital. Let the emergency medical personnel know if you think you have COVID-19. Summary  COVID-19 is a respiratory infection that is caused by a virus. It is also known as coronavirus disease or novel coronavirus. It can cause serious infections, such as pneumonia, acute respiratory distress syndrome, acute respiratory failure, or sepsis.  The virus that causes COVID-19 is contagious. This means that it can spread from person to person through droplets from breathing, speaking, singing, coughing, or sneezing.  You are more likely to develop a serious illness if you are 50  years of age or older, have a weak immune system, live in a nursing home, or have chronic disease.  There is no medicine to treat COVID-19. Your health care provider will talk with you about ways to treat your symptoms.  Take steps to protect yourself and others from infection. Wash your hands often and disinfect objects and surfaces that are frequently touched every day. Stay away from people who are sick and wear a mask if you are sick. This information is not intended to replace advice given to you by your health care provider. Make sure you discuss any questions you have with your health care provider. Document Revised: 05/09/2019 Document Reviewed: 08/15/2018 Elsevier Patient Education  [place].

## 2019-08-18 NOTE — Progress Notes (Signed)
Subjective:    [name], female    DOB: 1951-06-10, 69 y.o.   MRN: [id number]  Chief Complaint  Patient presents with  . Follow-up    HPI Patient is in today for follow up on chronic medical concerns. No recent febrile illness or hospitalizations. No polyuria or polydipsia. She is frustrated with weight gain after the pandemic year. Is heading back to her weight loss clinic. Denies CP/palp/SOB/HA/congestion/fevers/GI or GU c/o. Taking meds as prescribed  Past Medical History:  Diagnosis Date  . Abnormal nuclear stress test 08/23/2015  . Arthritis   . Chicken pox as a child  . Essential hypertension 07/17/2011   ACEI d/c 06/2011 for refractory cough> resolved   . GERD (gastroesophageal reflux disease)   . Hyperlipidemia   . Hypertension   . Hypothyroidism   . Insomnia 11/01/2014  . Low back pain radiating to right leg    intermittent and occasional numbness of skin over right hip in certain positions  . Measles as a child  . Mumps as a child  . Obesity   . Pneumonia    this October from which she has said inhailer  . Recurrent epistaxis 02/20/2012  . Rheumatoid arthritis ([place]) 09/10/2011   Bone on bone in knees Pain in L>R knees.   . RLS (restless legs syndrome) 11/01/2014    Past Surgical History:  Procedure Laterality Date  . CARDIAC CATHETERIZATION N/A 08/23/2015   Procedure: Left Heart Cath and Coronary Angiography;  Surgeon: [name], MD;  Location: [facility];  Service: Cardiovascular;  Laterality: N/A;  . CESAREAN SECTION     X 3  . GASTRECTOMY  12/2017  . INGUINAL HERNIA REPAIR Right 69 yrs old  . THYROIDECTOMY     total for benign tumor, Parathyroid spared  . TONSILLECTOMY    . TOTAL HIP ARTHROPLASTY Right 2006   secondary to congenital  hip defect    Family History  Problem Relation Age of Onset  . Lung cancer Mother 28       smoker  . Heart Problems Mother        tachycardia  . Hearing loss Mother   . Anxiety disorder  Mother        anxiety, claustrophobia  . Osteoarthritis Father   . Hyperlipidemia Father   . Anuerysm Father        AAA  . Hyperlipidemia Sister   . Migraines Sister   . Hypertension Sister   . Colon cancer Maternal Grandmother   . Heart attack Maternal Grandfather   . Aneurysm Paternal Grandfather        abdominal  . Heart disease Paternal Grandfather        AAA rupture, smoker  . Heart Problems Sister        tachycardia    Social History   Socioeconomic History  . Marital status: Married    Spouse name: Not on file  . Number of children: 3  . Years of education: Not on file  . Highest education level: Not on file  Occupational History  . Occupation: Training and development officer  . Occupation: theologian  Tobacco Use  . Smoking status: Former Smoker    Packs/day: 0.50    Years: 5.00    Pack years: 2.50    Types: Cigarettes    Quit date: 07/25/1971    Years since quitting: 48.0  . Smokeless tobacco: Never Used  Substance and Sexual Activity  . Alcohol use: Yes    Comment: O[facility]  .  Drug use: No  . 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 Not on file  Social History Narrative  . Not on file   Social Determinants of Health   Financial Resource Strain:   . Difficulty of Paying Living Expenses: Not on file  Food Insecurity:   . Worried About Charity fundraiser in the Last Year: Not on file  . Ran Out of Food in the Last Year: Not on file  Transportation Needs:   . Lack of Transportation (Medical): Not on file  . Lack of Transportation (Non-Medical): Not on file  Physical Activity:   . Days of Exercise per Week: Not on file  . Minutes of Exercise per Session: Not on file  Stress:   . Feeling of Stress : Not on file  Social Connections:   . Frequency of Communication with Friends and Family: Not on file  . Frequency of Social Gatherings with Friends and Family: Not on file  . Attends Religious Services: Not on  file  . Active Member of Clubs or Organizations: Not on file  . Attends Archivist Meetings: Not on file  . Marital Status: Not on file  Intimate Partner Violence:   . Fear of Current or Ex-Partner: Not on file  . Emotionally Abused: Not on file  . Physically Abused: Not on file  . Sexually Abused: Not on file    Outpatient Medications Prior to Visit  Medication Sig Dispense Refill  . albuterol (PROAIR HFA) 108 (90 Base) MCG/ACT inhaler Inhale 2 puffs into the lungs every 6 (six) hours as needed for wheezing or shortness of breath. 8.5 Inhaler 6  . aspirin EC 81 MG tablet Take 1 tablet (81 mg total) by mouth daily.    . benzonatate (TESSALON) 100 MG capsule Take 1 capsule (100 mg total) by mouth 2 (two) times daily as needed for cough. 30 capsule 0  . cetirizine (ZYRTEC) 10 MG tablet TAKE 1 TABLET BY MOUTH DAILY 90 tablet 1  . conjugated estrogens (PREMARIN) vaginal cream Apply small amount pea size topically on vagina twice a week 42.5 g 3  . Cyanocobalamin (VITAMIN B-12) 1000 MCG SUBL Place under the tongue.    . D3-1000 25 MCG (1000 UT) capsule TAKE 1 CAPSULE BY MOUTH EVERY DAY 90 capsule 0  . diclofenac sodium (VOLTAREN) 1 % GEL APPLY 2-4 GRAMS TOPICALLY TO AFFECTED JOINT UP TO 4 TIMES DAILY 400 g 2  . famotidine (PEPCID) 20 MG tablet TAKE 1 TABLET(20 MG) BY MOUTH AT BEDTIME 90 tablet 0  . fluticasone (FLONASE) 50 MCG/ACT nasal spray SHAKE LIQUID AND USE 2 SPRAYS IN EACH NOSTRIL DAILY 48 g 2  . furosemide (LASIX) 20 MG tablet TAKE 2 TABLETS BY MOUTH DAILY FOR 5 DAYS THEN DECREASE TO 1 TABLET BY MOUTH DAILY AS NEEDED 180 tablet 0  . hydrochlorothiazide (HYDRODIURIL) 25 MG tablet TAKE 1 TABLET BY MOUTH DAILY 90 tablet 1  . levothyroxine (SYNTHROID) 100 MCG tablet TAKE 1 TABLET BY MOUTH DAILY BEFORE BREAKFAST. 90 tablet 0  . MELATONIN PO Take 1 tablet by mouth at bedtime as needed (sleep).    . metoprolol tartrate (LOPRESSOR) 50 MG tablet TAKE 1 TABLET(50 MG) BY MOUTH TWICE  DAILY 180 tablet 1  . nitroGLYCERIN (NITROSTAT) 0.4 MG SL tablet Place 1 tablet (0.4 mg total) under the tongue every 5 (five) minutes as needed for chest pain. 25 tablet 3  .  pantoprazole (PROTONIX) 40 MG tablet TAKE 1 TABLET BY MOUTH DAILY 90 tablet 1  . potassium chloride SA (K-DUR,KLOR-CON) 20 MEQ tablet TAKE 1 TABLET BY MOUTH DAILY AS NEEDED(DEPENDING ON LASIX USE) 90 tablet 3  . rOPINIRole (REQUIP) 0.5 MG tablet TAKE 1 TO 2 TABLETS BY MOUTH AT BEDTIME AS NEEDED 180 tablet 1  . SENNA PO Take by mouth daily.    . simvastatin (ZOCOR) 40 MG tablet TAKE 1 TABLET BY MOUTH EVERY EVENING 90 tablet 1  . topiramate (TOPAMAX) 50 MG tablet TK 1/2 T PO AT DINNER FOR 1 WEEK THEN INCREASE TO A FULL TABLET THEREAFTER    . traMADol (ULTRAM) 50 MG tablet TAKE 1 TO 2 TABLETS(50 TO 100 MG) BY MOUTH EVERY 8 HOURS AS NEEDED 90 tablet 0  . zolpidem (AMBIEN) 10 MG tablet TAKE 1 TABLET(10 MG) BY MOUTH AT BEDTIME AS NEEDED FOR SLEEP 30 tablet 0   No facility-administered medications prior to visit.    Allergies  Allergen Reactions  . Bee Venom Anaphylaxis  . Accupril [Quinapril Hcl] Cough  . Quinapril Cough  . Quinapril-Hydrochlorothiazide Cough    Review of Systems  Constitutional: Negative for fever and malaise/fatigue.  HENT: Negative for congestion.   Eyes: Negative for blurred vision.  Respiratory: Negative for shortness of breath.   Cardiovascular: Negative for chest pain, palpitations and leg swelling.  Gastrointestinal: Negative for abdominal pain, blood in stool and nausea.  Genitourinary: Negative for dysuria and frequency.  Musculoskeletal: Negative for falls.  Skin: Negative for rash.  Neurological: Negative for dizziness, loss of consciousness and headaches.  Endo/Heme/Allergies: Negative for environmental allergies.  Psychiatric/Behavioral: Negative for depression. The patient is not nervous/anxious.        Objective:    Physical Exam Vitals and nursing note reviewed.    Constitutional:      General: She is not in acute distress.    Appearance: She is well-developed. She is obese. She is not ill-appearing.  HENT:     Head: Normocephalic and atraumatic.     Nose: Nose normal.  Eyes:     General:        Right eye: No discharge.        Left eye: No discharge.  Cardiovascular:     Rate and Rhythm: Normal rate and regular rhythm.     Heart sounds: No murmur.  Pulmonary:     Effort: Pulmonary effort is normal.     Breath sounds: Normal breath sounds.  Abdominal:     General: Bowel sounds are normal.     Palpations: Abdomen is soft.     Tenderness: There is no abdominal tenderness.  Musculoskeletal:     Cervical back: Normal range of motion and neck supple.  Skin:    General: Skin is warm and dry.  Neurological:     Mental Status: She is alert and oriented to person, place, and time.     BP 124/84 (BP Location: Left Arm, Patient Position: Sitting, Cuff Size: Normal)   Pulse 69   Temp (!) 97.5 F (36.4 C) (Temporal)   Resp 18   Wt 265 lb 6.4 oz (120.4 kg)   SpO2 98%   BMI 48.15 kg/m  Wt Readings from Last 3 Encounters:  08/18/19 265 lb 6.4 oz (120.4 kg)  05/30/19 268 lb (121.6 kg)  03/21/19 271 lb 3.2 oz (123 kg)    Diabetic Foot Exam - Simple   No data filed     Lab Results  Component Value Date  WBC 12.2 (H) 05/30/2019   HGB 14.0 05/30/2019   HCT 42.9 05/30/2019   PLT 400 05/30/2019   GLUCOSE 88 05/14/2018   CHOL 177 05/14/2018   TRIG 178.0 (H) 05/14/2018   HDL 52.60 05/14/2018   LDLCALC 89 05/14/2018   ALT 14 05/14/2018   AST 13 05/14/2018   NA 141 05/14/2018   K 4.5 05/14/2018   CL 102 05/14/2018   CREATININE 0.73 05/14/2018   BUN 21 05/14/2018   CO2 33 (H) 05/14/2018   TSH 1.27 05/14/2018   INR 0.99 08/19/2015    Lab Results  Component Value Date   TSH 1.27 05/14/2018   Lab Results  Component Value Date   WBC 12.2 (H) 05/30/2019   HGB 14.0 05/30/2019   HCT 42.9 05/30/2019   MCV 92.9 05/30/2019   PLT 400  05/30/2019   Lab Results  Component Value Date   NA 141 05/14/2018   K 4.5 05/14/2018   CHLORIDE 103 12/21/2016   CO2 33 (H) 05/14/2018   GLUCOSE 88 05/14/2018   BUN 21 05/14/2018   CREATININE 0.73 05/14/2018   BILITOT 0.6 05/14/2018   ALKPHOS 96 05/14/2018   AST 13 05/14/2018   ALT 14 05/14/2018   PROT 6.1 05/14/2018   ALBUMIN 3.8 05/14/2018   CALCIUM 9.6 05/14/2018   ANIONGAP 12 (H) 12/21/2017   EGFR 73 (L) 12/21/2016   GFR 84.30 05/14/2018   Lab Results  Component Value Date   CHOL 177 05/14/2018   Lab Results  Component Value Date   HDL 52.60 05/14/2018   Lab Results  Component Value Date   LDLCALC 89 05/14/2018   Lab Results  Component Value Date   TRIG 178.0 (H) 05/14/2018   Lab Results  Component Value Date   CHOLHDL 3 05/14/2018   No results found for: HGBA1C     Assessment & Plan:   Problem List Items Addressed This Visit    Morbid obesity ([place])    Very frustrated with weight gain during pandemic. Is headed back to her weight loss doctor and is scheduled for comlete blood work soon. She will forward to [place] for review.       Hypothyroidism    On Levothyroxine, continue to monitor. She is having complete blood work done with her weight loss doctor soon. She will forward [place] results for review      Hyperlipidemia    Encouraged heart healthy diet, increase exercise, avoid trans fats, consider a krill oil cap daily, tolerating statin      Essential hypertension    Well controlled, no changes to meds. Encouraged heart healthy diet such as the DASH diet and exercise as tolerated.       Anemia    Increase leafy greens, consider increased lean red meat and using cast iron cookware. Continue to monitor, report any concerns. Monitor with CBC      Insomnia    Encouraged good sleep hygiene such as dark, quiet room. No blue/green glowing lights such as computer screens in bedroom. No alcohol or stimulants in evening. Cut down on caffeine as able. Regular  exercise is helpful but not just prior to bed time. Ambien prn         I am having Rebecca Brooks maintain her aspirin EC, nitroGLYCERIN, MELATONIN PO, potassium chloride SA, Vitamin B-12, furosemide, SENNA PO, topiramate, zolpidem, albuterol, benzonatate, Premarin, fluticasone, pantoprazole, diclofenac sodium, cetirizine, rOPINIRole, famotidine, D3-1000, traMADol, levothyroxine, simvastatin, hydrochlorothiazide, and metoprolol tartrate.  No orders of the defined types  were placed in this encounter.    Penni Homans, MD

## 2019-08-18 NOTE — Assessment & Plan Note (Signed)
Encouraged good sleep hygiene such as dark, quiet room. No blue/green glowing lights such as computer screens in bedroom. No alcohol or stimulants in evening. Cut down on caffeine as able. Regular exercise is helpful but not just prior to bed time.  Ambien prn 

## 2019-08-18 NOTE — Assessment & Plan Note (Signed)
Very frustrated with weight gain during pandemic. Is headed back to her weight loss doctor and is scheduled for comlete blood work soon. She will forward to [place] for review.

## 2019-08-18 NOTE — Assessment & Plan Note (Signed)
On Levothyroxine, continue to monitor. She is having complete blood work done with her weight loss doctor soon. She will forward [place] results for review

## 2019-08-18 NOTE — Assessment & Plan Note (Signed)
Increase leafy greens, consider increased lean red meat and using cast iron cookware. Continue to monitor, report any concerns. Monitor with CBC

## 2019-08-25 ENCOUNTER — Encounter: Payer: Self-pay | Admitting: Family Medicine

## 2019-08-27 DIAGNOSIS — E569 Vitamin deficiency, unspecified: Secondary | ICD-10-CM | POA: Diagnosis not present

## 2019-08-27 DIAGNOSIS — Z13220 Encounter for screening for lipoid disorders: Secondary | ICD-10-CM | POA: Diagnosis not present

## 2019-08-27 DIAGNOSIS — R635 Abnormal weight gain: Secondary | ICD-10-CM | POA: Diagnosis not present

## 2019-09-01 ENCOUNTER — Other Ambulatory Visit: Payer: Self-pay | Admitting: Family Medicine

## 2019-09-03 ENCOUNTER — Ambulatory Visit: Payer: Medicare Other | Attending: Internal Medicine

## 2019-09-03 DIAGNOSIS — Z23 Encounter for immunization: Secondary | ICD-10-CM | POA: Insufficient documentation

## 2019-09-03 NOTE — Progress Notes (Signed)
   Covid-19 Vaccination Clinic  Name:  [name]    MRN: [id number] DOB: 10-11-1950  09/03/2019  [name] was observed post Covid-19 immunization for 15 minutes without incidence. She was provided with Vaccine Information Sheet and instruction to access the V-Safe system.   [name] was instructed to call 911 with any severe reactions post vaccine: Marland Kitchen Difficulty breathing  . Swelling of your face and throat  . A fast heartbeat  . A bad rash all over your body  . Dizziness and weakness    Immunizations Administered    Name Date Dose VIS Date Route   Pfizer COVID-19 Vaccine 09/03/2019 11:43 AM 0.3 mL 07/04/2019 Intramuscular   Manufacturer: [place]   Lot: [id number]   Saline: [id number]

## 2019-09-12 NOTE — Progress Notes (Addendum)
Office Visit Note  Patient: Rebecca Brooks             Date of Birth: 25-Jan-1951           MRN: [id number]             PCP: Mosie Lukes, MD Referring: Mosie Lukes, MD Visit Date: 09/16/2019 Occupation: @GUAROCC @  Subjective:  Other (bilateral knee pain, patient requests cortisone injections. )   History of Present Illness: Rebecca Brooks is a 69 y.o. female with a past medical history of osteoarthritis and degenerative disc disease. She presents today with persistent bilateral knee pain, the right side remains worse than the left. However, she states that she often experiences locking of her left knee. She endorses occasional swelling and warmth of her bilateral knees. She uses a cane while walking and using the stairs to help prevent falls. Patient expresses fear of falling due to instability and pain in her knees. She has also had one episode of right ankle pain that occurred 3-4 days ago with use that has since subsided. She is currently scheduled to follow up with [facility] weight management to aid in weight loss prior to knee replacement surgery. She also endorses pain in bilateral hands and wrists. She has difficulty performing daily activities, such as painting, knitting, and crocheting due to the pain. She has had carpal tunnel surgery in her left hand and currently wears a carpal tunnel brace on her right hand predominantly at night. She will often wrap her hands in warm heating pads to help alleviate the pain. She endorses occasional swelling in her fingers. She denies any lumbar back pain at this time. Patient states that she is sleeping well at night since using her CPAP.  Activities of Daily Living:  Patient reports morning stiffness for 20-30 minutes.   Patient Denies nocturnal pain.  Difficulty dressing/grooming: Reports Difficulty climbing stairs: Reports Difficulty getting out of chair: Reports Difficulty using hands for taps, buttons, cutlery, and/or writing:  Reports  Review of Systems  Constitutional: Negative for fatigue.  HENT: Negative for mouth sores, mouth dryness and nose dryness.   Eyes: Negative for itching and dryness.  Respiratory: Negative for shortness of breath, wheezing and difficulty breathing.   Cardiovascular: Negative for chest pain and palpitations.  Gastrointestinal: Negative for blood in stool, constipation and diarrhea.  Endocrine: Negative for increased urination.  Genitourinary: Negative for difficulty urinating and painful urination.  Musculoskeletal: Positive for arthralgias, joint pain, joint swelling and morning stiffness.  Skin: Negative for rash.  Allergic/Immunologic: Negative for susceptible to infections.  Neurological: Negative for dizziness, headaches, memory loss and weakness.  Hematological: Negative for bruising/bleeding tendency.  Psychiatric/Behavioral: Negative for confusion and sleep disturbance.    PMFS History:  Patient Active Problem List   Diagnosis Date Noted  . Exposure to COVID-19 virus 10/16/2018  . URI (upper respiratory infection) 10/16/2018  . Atrophic vaginitis 05/14/2018  . OSA (obstructive sleep apnea) 04/26/2018  . Muscle cramps 01/02/2018  . IDA (iron deficiency anemia) 12/22/2016  . Primary osteoarthritis of both knees 10/03/2016  . DDD (degenerative disc disease), cervical 10/03/2016  . DDD (degenerative disc disease), lumbar 10/03/2016  . History of total hip replacement, right 10/03/2016  . History of hypothyroidism 10/03/2016  . History of hypertension 10/03/2016  . History of anemia 10/03/2016  . Chondromalacia 10/03/2016  . Primary osteoarthritis of shoulder 10/03/2016  . Primary osteoarthritis of both hands 10/03/2016  . Abnormal nuclear stress test 08/23/2015  . Globus  sensation 08/13/2015  . Special screening for malignant neoplasms, colon 08/13/2015  . Chest pain at rest   . Chest pain with moderate risk of acute coronary syndrome 07/28/2015  . Annual physical  exam 07/11/2015  . RLS (restless legs syndrome) 11/01/2014  . Insomnia 11/01/2014  . Thoracic back pain 01/12/2014  . Allergic rhinitis 11/03/2013  . Preventative health care 12/13/2012  . Recurrent epistaxis 02/20/2012  . Shoulder pain, bilateral 11/01/2011  . Arthralgia of both knees 10/18/2011  . Peripheral edema 09/10/2011  . Asthma 08/19/2011  . Palpitations 08/01/2011  . Anemia 08/01/2011  . Hypokalemia 08/01/2011  . Elevated WBC count 08/01/2011  . GERD (gastroesophageal reflux disease)   . Low back pain radiating to right leg   . Morbid obesity ([place]) 07/17/2011  . Hypothyroidism 07/17/2011  . Hyperlipidemia 07/17/2011  . Essential hypertension 07/17/2011    Past Medical History:  Diagnosis Date  . Abnormal nuclear stress test 08/23/2015  . Arthritis   . Chicken pox as a child  . Essential hypertension 07/17/2011   ACEI d/c 06/2011 for refractory cough> resolved   . GERD (gastroesophageal reflux disease)   . Hyperlipidemia   . Hypertension   . Hypothyroidism   . Insomnia 11/01/2014  . Low back pain radiating to right leg    intermittent and occasional numbness of skin over right hip in certain positions  . Measles as a child  . Mumps as a child  . Obesity   . Pneumonia    this October from which she has said inhailer  . Recurrent epistaxis 02/20/2012  . Rheumatoid arthritis ([place]) 09/10/2011   Bone on bone in knees Pain in L>R knees.   . RLS (restless legs syndrome) 11/01/2014    Family History  Problem Relation Age of Onset  . Lung cancer Mother 81       smoker  . Heart Problems Mother        tachycardia  . Hearing loss Mother   . Anxiety disorder Mother        anxiety, claustrophobia  . Osteoarthritis Father   . Hyperlipidemia Father   . Anuerysm Father        AAA  . Hyperlipidemia Sister   . Migraines Sister   . Hypertension Sister   . Colon cancer Maternal Grandmother   . Heart attack Maternal Grandfather   . Aneurysm Paternal Grandfather         abdominal  . Heart disease Paternal Grandfather        AAA rupture, smoker  . Heart Problems Sister        tachycardia   Past Surgical History:  Procedure Laterality Date  . CARDIAC CATHETERIZATION N/A 08/23/2015   Procedure: Left Heart Cath and Coronary Angiography;  Surgeon: [name], MD;  Location: [place] [facility];  Service: Cardiovascular;  Laterality: N/A;  . CESAREAN SECTION     X 3  . GASTRECTOMY  12/2017  . INGUINAL HERNIA REPAIR Right 69 yrs old  . THYROIDECTOMY     total for benign tumor, Parathyroid spared  . TONSILLECTOMY    . TOTAL HIP ARTHROPLASTY Right 2006   secondary to congenital  hip defect   Social History   Social History Narrative  . Not on file   Immunization History  Administered Date(s) Administered  . Fluad Quad(high Dose 65+) 04/19/2019  . Influenza Split 07/19/2012  . Influenza Whole 06/24/2011, 05/13/2013  . Influenza, High Dose Seasonal PF 06/22/2017, 05/14/2018  . Influenza,inj,Quad PF,6+ Mos 06/03/2016  .  Influenza-Unspecified 05/07/2014, 06/08/2015  . PFIZER SARS-COV-2 Vaccination 08/12/2019, 09/03/2019  . Pneumococcal Conjugate-13 05/14/2018  . Pneumococcal Polysaccharide-23 07/18/2011  . Td 07/24/2005  . Tdap 08/01/2011  . Zoster 07/23/2012     Objective: Vital Signs: BP 127/86 (BP Location: Left Wrist, Patient Position: Sitting, Cuff Size: Normal)   Pulse 71   Resp 16   Ht 5' 2.25" (1.581 m)   Wt 270 lb (122.5 kg)   BMI 48.99 kg/m    Physical Exam Vitals and nursing note reviewed.  Constitutional:      Appearance: She is well-developed.  HENT:     Head: Normocephalic and atraumatic.  Eyes:     Conjunctiva/sclera: Conjunctivae normal.  Cardiovascular:     Rate and Rhythm: Normal rate and regular rhythm.     Heart sounds: Normal heart sounds.  Pulmonary:     Effort: Pulmonary effort is normal.     Breath sounds: Normal breath sounds.  Abdominal:     General: Bowel sounds are normal.     Palpations: Abdomen is  soft.  Musculoskeletal:     Cervical back: Normal range of motion.  Lymphadenopathy:     Cervical: No cervical adenopathy.  Skin:    General: Skin is warm and dry.     Capillary Refill: Capillary refill takes less than 2 seconds.  Neurological:     Mental Status: She is alert and oriented to person, place, and time.  Psychiatric:        Behavior: Behavior normal.      Musculoskeletal Exam: Patient had limited range of motion of her cervical and lumbar spine.  Shoulder joints of the joints in good range of motion with discomfort.  She has PIP and DIP thickening in her hands consistent with osteoarthritis.  She is good range of motion of her hip joints.  She has crepitus and discomfort with range of motion of bilateral knee joints without any warmth swelling or effusion.  Ankle joints with good range of motion.  CDAI Exam: CDAI Score: -- Patient Global: --; Provider Global: -- Swollen: --; Tender: -- Joint Exam 09/16/2019   No joint exam has been documented for this visit   There is currently no information documented on the homunculus. Go to the Rheumatology activity and complete the homunculus joint exam.  Investigation: No additional findings.  Imaging: No results found.  Recent Labs: Lab Results  Component Value Date   WBC 12.2 (H) 05/30/2019   HGB 14.0 05/30/2019   PLT 400 05/30/2019   NA 141 05/14/2018   K 4.5 05/14/2018   CL 102 05/14/2018   CO2 33 (H) 05/14/2018   GLUCOSE 88 05/14/2018   BUN 21 05/14/2018   CREATININE 0.73 05/14/2018   BILITOT 0.6 05/14/2018   ALKPHOS 96 05/14/2018   AST 13 05/14/2018   ALT 14 05/14/2018   PROT 6.1 05/14/2018   ALBUMIN 3.8 05/14/2018   CALCIUM 9.6 05/14/2018   GFRAA >60 12/21/2017    Speciality Comments: No specialty comments available.  Procedures:  Large Joint Inj: bilateral knee on 09/16/2019 10:31 AM Indications: pain Details: 27 G 1.5 in needle, medial approach  Arthrogram: No  Medications (Right): 1 mL  lidocaine 1 %; 40 mg triamcinolone acetonide 40 MG/ML Aspirate (Right): 0 mL Medications (Left): 1 mL lidocaine 1 %; 40 mg triamcinolone acetonide 40 MG/ML Aspirate (Left): 0 mL Outcome: tolerated well, no immediate complications Procedure, treatment alternatives, risks and benefits explained, specific risks discussed. Consent was given by the patient. Immediately prior to procedure  a time out was called to verify the correct patient, procedure, equipment, support staff and site/side marked as required. Patient was prepped and draped in the usual sterile fashion.     Allergies: Bee venom, Accupril [quinapril hcl], Quinapril, and Quinapril-hydrochlorothiazide   Assessment / Plan:     Visit Diagnoses: Primary osteoarthritis of both hands-patient has DIP and PIP thickening bilateral hands.  She has been having increased discomfort in her left CMC joint.  A prescription for left CMC brace was given.  Joint protection was discussed.  Primary osteoarthritis of both shoulders-she has discomfort range of motion of bilateral shoulder joints.  Primary osteoarthritis of both knees-she has pain and discomfort in her bilateral knee joints.  She has end-stage osteoarthritis in bilateral knee joints.  Per her request bilateral knee joints were injected with cortisone.  Side effects were discussed at length.  She tolerated the procedure well.  History of total hip replacement, right-she is fairly good range of motion.  DDD (degenerative disc disease), cervical-she has some limitation of range of motion..  Chronic pain  DDD (degenerative disc disease), lumbar-chronic pain  Primary insomnia-good sleep hygiene was discussed. Other medical conditions are listed as follows:  Peripheral edema  History of hypothyroidism  History of hypertension  History of anemia  History of high cholesterol  BMI 48.99-patient has joined a weight loss program and is trying intentional weight loss.  Orders: Orders  Placed This Encounter  Procedures  . Large Joint Inj: bilateral knee   Meds ordered this encounter  Medications  . lidocaine (XYLOCAINE) 1 % (with pres) injection 1 mL  . triamcinolone acetonide (KENALOG-40) injection 40 mg  . lidocaine (XYLOCAINE) 1 % (with pres) injection 1 mL  . triamcinolone acetonide (KENALOG-40) injection 40 mg     Follow-Up Instructions: Return in about 6 months (around 03/15/2020) for Osteoarthritis.   Bo Merino, MD  Note - This record has been created using Editor, commissioning.  Chart creation errors have been sought, but may not always  have been located. Such creation errors do not reflect on  the standard of medical care.

## 2019-09-16 ENCOUNTER — Encounter: Payer: Self-pay | Admitting: Physician Assistant

## 2019-09-16 ENCOUNTER — Other Ambulatory Visit: Payer: Self-pay

## 2019-09-16 ENCOUNTER — Ambulatory Visit: Payer: Medicare Other | Admitting: Rheumatology

## 2019-09-16 VITALS — BP 127/86 | HR 71 | Resp 16 | Ht 62.25 in | Wt 270.0 lb

## 2019-09-16 DIAGNOSIS — Z8679 Personal history of other diseases of the circulatory system: Secondary | ICD-10-CM

## 2019-09-16 DIAGNOSIS — Z6841 Body Mass Index (BMI) 40.0 and over, adult: Secondary | ICD-10-CM

## 2019-09-16 DIAGNOSIS — M5030 Other cervical disc degeneration, unspecified cervical region: Secondary | ICD-10-CM

## 2019-09-16 DIAGNOSIS — M19011 Primary osteoarthritis, right shoulder: Secondary | ICD-10-CM

## 2019-09-16 DIAGNOSIS — Z96641 Presence of right artificial hip joint: Secondary | ICD-10-CM | POA: Diagnosis not present

## 2019-09-16 DIAGNOSIS — M51369 Other intervertebral disc degeneration, lumbar region without mention of lumbar back pain or lower extremity pain: Secondary | ICD-10-CM

## 2019-09-16 DIAGNOSIS — M170 Bilateral primary osteoarthritis of knee: Secondary | ICD-10-CM | POA: Diagnosis not present

## 2019-09-16 DIAGNOSIS — M19041 Primary osteoarthritis, right hand: Secondary | ICD-10-CM | POA: Diagnosis not present

## 2019-09-16 DIAGNOSIS — Z8639 Personal history of other endocrine, nutritional and metabolic disease: Secondary | ICD-10-CM

## 2019-09-16 DIAGNOSIS — M19012 Primary osteoarthritis, left shoulder: Secondary | ICD-10-CM

## 2019-09-16 DIAGNOSIS — M19042 Primary osteoarthritis, left hand: Secondary | ICD-10-CM

## 2019-09-16 DIAGNOSIS — R609 Edema, unspecified: Secondary | ICD-10-CM

## 2019-09-16 DIAGNOSIS — R600 Localized edema: Secondary | ICD-10-CM

## 2019-09-16 DIAGNOSIS — Z862 Personal history of diseases of the blood and blood-forming organs and certain disorders involving the immune mechanism: Secondary | ICD-10-CM

## 2019-09-16 DIAGNOSIS — M5136 Other intervertebral disc degeneration, lumbar region: Secondary | ICD-10-CM

## 2019-09-16 DIAGNOSIS — F5101 Primary insomnia: Secondary | ICD-10-CM

## 2019-09-16 MED ORDER — LIDOCAINE HCL 1 % IJ SOLN
1.0000 mL | INTRAMUSCULAR | Status: AC | PRN
  Administered 2019-09-16: 1 mL

## 2019-09-16 MED ORDER — TRIAMCINOLONE ACETONIDE 40 MG/ML IJ SUSP
40.0000 mg | INTRAMUSCULAR | Status: AC | PRN
  Administered 2019-09-16: 40 mg via INTRA_ARTICULAR

## 2019-10-01 ENCOUNTER — Other Ambulatory Visit: Payer: Self-pay | Admitting: Family Medicine

## 2019-10-02 DIAGNOSIS — E8881 Metabolic syndrome: Secondary | ICD-10-CM | POA: Diagnosis not present

## 2019-10-02 DIAGNOSIS — I10 Essential (primary) hypertension: Secondary | ICD-10-CM | POA: Diagnosis not present

## 2019-10-02 DIAGNOSIS — M170 Bilateral primary osteoarthritis of knee: Secondary | ICD-10-CM | POA: Diagnosis not present

## 2019-10-02 DIAGNOSIS — E785 Hyperlipidemia, unspecified: Secondary | ICD-10-CM | POA: Diagnosis not present

## 2019-10-14 ENCOUNTER — Other Ambulatory Visit: Payer: Self-pay | Admitting: Family Medicine

## 2019-10-24 DIAGNOSIS — E669 Obesity, unspecified: Secondary | ICD-10-CM | POA: Diagnosis not present

## 2019-10-24 DIAGNOSIS — Z6841 Body Mass Index (BMI) 40.0 and over, adult: Secondary | ICD-10-CM | POA: Diagnosis not present

## 2019-10-24 DIAGNOSIS — I10 Essential (primary) hypertension: Secondary | ICD-10-CM | POA: Diagnosis not present

## 2019-10-24 DIAGNOSIS — E8881 Metabolic syndrome: Secondary | ICD-10-CM | POA: Diagnosis not present

## 2019-10-31 DIAGNOSIS — I10 Essential (primary) hypertension: Secondary | ICD-10-CM | POA: Diagnosis not present

## 2019-10-31 DIAGNOSIS — Z6841 Body Mass Index (BMI) 40.0 and over, adult: Secondary | ICD-10-CM | POA: Diagnosis not present

## 2019-10-31 DIAGNOSIS — E669 Obesity, unspecified: Secondary | ICD-10-CM | POA: Diagnosis not present

## 2019-11-04 ENCOUNTER — Other Ambulatory Visit: Payer: Self-pay | Admitting: Family Medicine

## 2019-11-28 ENCOUNTER — Inpatient Hospital Stay (HOSPITAL_BASED_OUTPATIENT_CLINIC_OR_DEPARTMENT_OTHER): Payer: Medicare Other | Admitting: Family

## 2019-11-28 ENCOUNTER — Inpatient Hospital Stay: Payer: Medicare Other | Attending: Family

## 2019-11-28 ENCOUNTER — Encounter: Payer: Self-pay | Admitting: Family

## 2019-11-28 ENCOUNTER — Other Ambulatory Visit: Payer: Self-pay

## 2019-11-28 VITALS — BP 128/68 | HR 65 | Temp 97.1°F | Resp 18 | Ht 62.0 in | Wt 259.8 lb

## 2019-11-28 DIAGNOSIS — D75839 Thrombocytosis, unspecified: Secondary | ICD-10-CM

## 2019-11-28 DIAGNOSIS — D473 Essential (hemorrhagic) thrombocythemia: Secondary | ICD-10-CM | POA: Insufficient documentation

## 2019-11-28 DIAGNOSIS — D509 Iron deficiency anemia, unspecified: Secondary | ICD-10-CM | POA: Diagnosis not present

## 2019-11-28 DIAGNOSIS — D72829 Elevated white blood cell count, unspecified: Secondary | ICD-10-CM | POA: Diagnosis not present

## 2019-11-28 DIAGNOSIS — D508 Other iron deficiency anemias: Secondary | ICD-10-CM

## 2019-11-28 DIAGNOSIS — Z9884 Bariatric surgery status: Secondary | ICD-10-CM | POA: Insufficient documentation

## 2019-11-28 LAB — RETICULOCYTES
Immature Retic Fract: 11.4 % (ref 2.3–15.9)
RBC.: 4.73 MIL/uL (ref 3.87–5.11)
Retic Count, Absolute: 88.5 10*3/uL (ref 19.0–186.0)
Retic Ct Pct: 1.9 % (ref 0.4–3.1)

## 2019-11-28 LAB — CBC WITH DIFFERENTIAL (CANCER CENTER ONLY)
Abs Immature Granulocytes: 0.18 10*3/uL — ABNORMAL HIGH (ref 0.00–0.07)
Basophils Absolute: 0.1 10*3/uL (ref 0.0–0.1)
Basophils Relative: 1 %
Eosinophils Absolute: 0.1 10*3/uL (ref 0.0–0.5)
Eosinophils Relative: 1 %
HCT: 43.7 % (ref 36.0–46.0)
Hemoglobin: 14.1 g/dL (ref 12.0–15.0)
Immature Granulocytes: 2 %
Lymphocytes Relative: 17 %
Lymphs Abs: 1.7 10*3/uL (ref 0.7–4.0)
MCH: 30.2 pg (ref 26.0–34.0)
MCHC: 32.3 g/dL (ref 30.0–36.0)
MCV: 93.6 fL (ref 80.0–100.0)
Monocytes Absolute: 0.7 10*3/uL (ref 0.1–1.0)
Monocytes Relative: 7 %
Neutro Abs: 7.2 10*3/uL (ref 1.7–7.7)
Neutrophils Relative %: 72 %
Platelet Count: 375 10*3/uL (ref 150–400)
RBC: 4.67 MIL/uL (ref 3.87–5.11)
RDW: 12.7 % (ref 11.5–15.5)
WBC Count: 9.9 10*3/uL (ref 4.0–10.5)
nRBC: 0 % (ref 0.0–0.2)

## 2019-11-28 NOTE — Progress Notes (Signed)
Hematology and Oncology Follow Up Visit  Rebecca Brooks [id number] 11/06/1950 69 y.o. 11/28/2019   Principle Diagnosis:  Reactive leukocytosis Iron deficiency anemia  Thrombocytosis  Current Therapy:        IV iron as indicated   Interim History:  Rebecca Brooks is here today for follow-up. She is doing well and has no complaints at this time.  She has been keeping her sweet granddaughter Rebecca Brooks several days a week and is still painting.  The bleeding with her premarin cream has since stopped. No other blood loss noted. No bruising or petechiae.  No fever, chills, n/v, cough, rash, dizziness, SOB, chest pain, palpitations, abdominal pain or changes in bowel or bladder habits.  No swelling, tenderness, numbness or tingling in her extremities.  No falls or syncope.  She has maintained a good appetite and is staying well hydrated. Her weight is stable.   ECOG Performance Status: 0 - Asymptomatic  Medications:  Allergies as of 11/28/2019      Reactions   Bee Venom Anaphylaxis   Accupril [quinapril Hcl] Cough   Quinapril Cough   Quinapril-hydrochlorothiazide Cough      Medication List       Accurate as of Nov 28, 2019  2:02 PM. If you have any questions, ask your nurse or doctor.        albuterol 108 (90 Base) MCG/ACT inhaler Commonly known as: ProAir HFA Inhale 2 puffs into the lungs every 6 (six) hours as needed for wheezing or shortness of breath.   aspirin EC 81 MG tablet Take 1 tablet (81 mg total) by mouth daily.   benzonatate 100 MG capsule Commonly known as: TESSALON Take 1 capsule (100 mg total) by mouth 2 (two) times daily as needed for cough.   cetirizine 10 MG tablet Commonly known as: ZYRTEC TAKE 1 TABLET BY MOUTH DAILY   D3-1000 25 MCG (1000 UT) capsule Generic drug: Cholecalciferol TAKE 1 CAPSULE BY MOUTH EVERY DAY   diclofenac sodium 1 % Gel Commonly known as: VOLTAREN APPLY 2-4 GRAMS TOPICALLY TO AFFECTED JOINT UP TO 4 TIMES DAILY   famotidine 20  MG tablet Commonly known as: PEPCID TAKE 1 TABLET(20 MG) BY MOUTH AT BEDTIME   fluticasone 50 MCG/ACT nasal spray Commonly known as: FLONASE SHAKE LIQUID AND USE 2 SPRAYS IN EACH NOSTRIL DAILY   furosemide 20 MG tablet Commonly known as: LASIX TAKE 2 TABLETS BY MOUTH DAILY FOR 5 DAYS THEN DECREASE TO 1 TABLET BY MOUTH DAILY AS NEEDED   hydrochlorothiazide 25 MG tablet Commonly known as: HYDRODIURIL TAKE 1 TABLET BY MOUTH DAILY   levothyroxine 100 MCG tablet Commonly known as: SYNTHROID TAKE 1 TABLET BY MOUTH DAILY BEFORE BREAKFAST.   MELATONIN PO Take 1 tablet by mouth at bedtime as needed (sleep).   metoprolol tartrate 50 MG tablet Commonly known as: LOPRESSOR TAKE 1 TABLET(50 MG) BY MOUTH TWICE DAILY   nitroGLYCERIN 0.4 MG SL tablet Commonly known as: NITROSTAT Place 1 tablet (0.4 mg total) under the tongue every 5 (five) minutes as needed for chest pain.   pantoprazole 40 MG tablet Commonly known as: PROTONIX TAKE 1 TABLET BY MOUTH DAILY   potassium chloride SA 20 MEQ tablet Commonly known as: KLOR-CON TAKE 1 TABLET BY MOUTH DAILY AS NEEDED(DEPENDING ON LASIX USE)   Premarin vaginal cream Generic drug: conjugated estrogens Apply small amount pea size topically on vagina twice a week   rOPINIRole 0.5 MG tablet Commonly known as: REQUIP TAKE 1 TO 2 TABLETS BY MOUTH  AT BEDTIME AS NEEDED   SENNA PO Take by mouth daily.   simvastatin 40 MG tablet Commonly known as: ZOCOR TAKE 1 TABLET BY MOUTH EVERY EVENING   topiramate 50 MG tablet Commonly known as: TOPAMAX TK 1/2 T PO AT DINNER FOR 1 WEEK THEN INCREASE TO A FULL TABLET THEREAFTER   traMADol 50 MG tablet Commonly known as: ULTRAM TAKE 1 TO 2 TABLETS(50 TO 100 MG) BY MOUTH EVERY 8 HOURS AS NEEDED   Vitamin B-12 1000 MCG Subl Place under the tongue.   zolpidem 10 MG tablet Commonly known as: AMBIEN TAKE 1 TABLET(10 MG) BY MOUTH AT BEDTIME AS NEEDED FOR SLEEP       Allergies:  Allergies  Allergen  Reactions  . Bee Venom Anaphylaxis  . Accupril [Quinapril Hcl] Cough  . Quinapril Cough  . Quinapril-Hydrochlorothiazide Cough    Past Medical History, Surgical history, Social history, and Family History were reviewed and updated.  Review of Systems: All other 10 point review of systems is negative.   Physical Exam:  vitals were not taken for this visit.   Wt Readings from Last 3 Encounters:  09/16/19 270 lb (122.5 kg)  08/18/19 265 lb 6.4 oz (120.4 kg)  05/30/19 268 lb (121.6 kg)    Ocular: Sclerae unicteric, pupils equal, round and reactive to light Ear-nose-throat: Oropharynx clear, dentition fair Lymphatic: No cervical or supraclavicular adenopathy Lungs no rales or rhonchi, good excursion bilaterally Heart regular rate and rhythm, no murmur appreciated Abd soft, nontender, positive bowel sounds, no liver or spleen tip palpated on exam, no fluid wave  MSK no focal spinal tenderness, no joint edema Neuro: non-focal, well-oriented, appropriate affect Breasts: Deferred   Lab Results  Component Value Date   WBC 9.9 11/28/2019   HGB 14.1 11/28/2019   HCT 43.7 11/28/2019   MCV 93.6 11/28/2019   PLT 375 11/28/2019   Lab Results  Component Value Date   FERRITIN 306 05/30/2019   IRON 78 05/30/2019   TIBC 295 05/30/2019   UIBC 217 05/30/2019   IRONPCTSAT 26 05/30/2019   Lab Results  Component Value Date   RETICCTPCT 1.9 11/28/2019   RBC 4.73 11/28/2019   No results found for: KPAFRELGTCHN, LAMBDASER, KAPLAMBRATIO No results found for: IGGSERUM, IGA, IGMSERUM No results found for: Kathrynn Ducking, MSPIKE, SPEI   Chemistry      Component Value Date/Time   NA 141 05/14/2018 1424   NA 143 06/22/2017 1311   NA 142 12/21/2016 0922   K 4.5 05/14/2018 1424   K 3.1 (L) 06/22/2017 1311   K 3.5 12/21/2016 0922   CL 102 05/14/2018 1424   CL 99 06/22/2017 1311   CO2 33 (H) 05/14/2018 1424   CO2 32 06/22/2017 1311   CO2 30  (H) 12/21/2016 0922   BUN 21 05/14/2018 1424   BUN 21 06/22/2017 1311   BUN 22.4 12/21/2016 0922   CREATININE 0.73 05/14/2018 1424   CREATININE 0.76 12/21/2017 1327   CREATININE 0.9 06/22/2017 1311   CREATININE 0.8 12/21/2016 0922      Component Value Date/Time   CALCIUM 9.6 05/14/2018 1424   CALCIUM 8.9 06/22/2017 1311   CALCIUM 9.2 12/21/2016 0922   ALKPHOS 96 05/14/2018 1424   ALKPHOS 99 (H) 06/22/2017 1311   ALKPHOS 114 12/21/2016 0922   AST 13 05/14/2018 1424   AST 16 12/21/2017 1327   AST 15 12/21/2016 0922   ALT 14 05/14/2018 1424   ALT 17 12/21/2017 1327  ALT 19 06/22/2017 1311   ALT 13 12/21/2016 0922   BILITOT 0.6 05/14/2018 1424   BILITOT 0.5 12/21/2017 1327   BILITOT 0.29 12/21/2016 [id number]       Impression and Plan: Ms. Solberg is a very pleasant7 yo caucasian female withmild reactive leukocytosis with arthritis and iron deficiency secondary to malabsorption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 Iron studies are pending. We will replace if needed.  We will see her again in another 6 months.  She will contact our office with any questions or concerns. We can certainly see her sooner if needed.   Laverna Peace, NP 5/7/20212:02 PM

## 2019-12-01 ENCOUNTER — Telehealth: Payer: Self-pay | Admitting: Family

## 2019-12-01 LAB — FERRITIN: Ferritin: 392 ng/mL — ABNORMAL HIGH (ref 11–307)

## 2019-12-01 LAB — IRON AND TIBC
Iron: 81 ug/dL (ref 41–142)
Saturation Ratios: 27 % (ref 21–57)
TIBC: 304 ug/dL (ref 236–444)
UIBC: 223 ug/dL (ref 120–384)

## 2019-12-01 NOTE — Telephone Encounter (Signed)
Called and LMVM regarding appointments added. Calendar was mailed as well per 5/7 los

## 2019-12-02 DIAGNOSIS — Z6841 Body Mass Index (BMI) 40.0 and over, adult: Secondary | ICD-10-CM | POA: Diagnosis not present

## 2019-12-02 DIAGNOSIS — I10 Essential (primary) hypertension: Secondary | ICD-10-CM | POA: Diagnosis not present

## 2019-12-02 DIAGNOSIS — E669 Obesity, unspecified: Secondary | ICD-10-CM | POA: Diagnosis not present

## 2019-12-09 ENCOUNTER — Other Ambulatory Visit: Payer: Self-pay | Admitting: Family Medicine

## 2019-12-19 DIAGNOSIS — G4733 Obstructive sleep apnea (adult) (pediatric): Secondary | ICD-10-CM | POA: Diagnosis not present

## 2019-12-20 ENCOUNTER — Other Ambulatory Visit: Payer: Self-pay | Admitting: Family Medicine

## 2019-12-23 NOTE — Telephone Encounter (Signed)
Last written: 08/18/19   Last ov: 08/18/19 Next ov: 02/16/20 Contract: due UDS: 05/14/18

## 2019-12-29 ENCOUNTER — Other Ambulatory Visit: Payer: Self-pay | Admitting: Family Medicine

## 2019-12-30 DIAGNOSIS — Z6841 Body Mass Index (BMI) 40.0 and over, adult: Secondary | ICD-10-CM | POA: Diagnosis not present

## 2019-12-30 DIAGNOSIS — I10 Essential (primary) hypertension: Secondary | ICD-10-CM | POA: Diagnosis not present

## 2019-12-30 DIAGNOSIS — E8881 Metabolic syndrome: Secondary | ICD-10-CM | POA: Diagnosis not present

## 2020-01-13 DIAGNOSIS — Z6841 Body Mass Index (BMI) 40.0 and over, adult: Secondary | ICD-10-CM | POA: Diagnosis not present

## 2020-01-13 DIAGNOSIS — Z7182 Exercise counseling: Secondary | ICD-10-CM | POA: Diagnosis not present

## 2020-01-23 ENCOUNTER — Other Ambulatory Visit: Payer: Self-pay | Admitting: Family Medicine

## 2020-02-10 DIAGNOSIS — Z6841 Body Mass Index (BMI) 40.0 and over, adult: Secondary | ICD-10-CM | POA: Diagnosis not present

## 2020-02-11 ENCOUNTER — Other Ambulatory Visit: Payer: Self-pay | Admitting: Family Medicine

## 2020-02-16 ENCOUNTER — Other Ambulatory Visit: Payer: Self-pay

## 2020-02-16 ENCOUNTER — Encounter: Payer: Self-pay | Admitting: Family Medicine

## 2020-02-16 ENCOUNTER — Ambulatory Visit (INDEPENDENT_AMBULATORY_CARE_PROVIDER_SITE_OTHER): Payer: Medicare Other | Admitting: Family Medicine

## 2020-02-16 ENCOUNTER — Telehealth: Payer: Self-pay | Admitting: *Deleted

## 2020-02-16 VITALS — BP 116/68 | HR 69 | Temp 97.5°F | Resp 12 | Ht 62.0 in | Wt 271.8 lb

## 2020-02-16 DIAGNOSIS — R739 Hyperglycemia, unspecified: Secondary | ICD-10-CM | POA: Insufficient documentation

## 2020-02-16 DIAGNOSIS — E782 Mixed hyperlipidemia: Secondary | ICD-10-CM | POA: Diagnosis not present

## 2020-02-16 DIAGNOSIS — I10 Essential (primary) hypertension: Secondary | ICD-10-CM | POA: Diagnosis not present

## 2020-02-16 DIAGNOSIS — R252 Cramp and spasm: Secondary | ICD-10-CM | POA: Diagnosis not present

## 2020-02-16 DIAGNOSIS — M2550 Pain in unspecified joint: Secondary | ICD-10-CM

## 2020-02-16 DIAGNOSIS — Z0000 Encounter for general adult medical examination without abnormal findings: Secondary | ICD-10-CM | POA: Diagnosis not present

## 2020-02-16 DIAGNOSIS — E039 Hypothyroidism, unspecified: Secondary | ICD-10-CM | POA: Diagnosis not present

## 2020-02-16 DIAGNOSIS — M170 Bilateral primary osteoarthritis of knee: Secondary | ICD-10-CM

## 2020-02-16 LAB — CBC
HCT: 40.9 % (ref 36.0–46.0)
Hemoglobin: 13.5 g/dL (ref 12.0–15.0)
MCHC: 33.1 g/dL (ref 30.0–36.0)
MCV: 93.7 fl (ref 78.0–100.0)
Platelets: 342 10*3/uL (ref 150.0–400.0)
RBC: 4.36 Mil/uL (ref 3.87–5.11)
RDW: 13.2 % (ref 11.5–15.5)
WBC: 9.7 10*3/uL (ref 4.0–10.5)

## 2020-02-16 LAB — MAGNESIUM: Magnesium: 1.8 mg/dL (ref 1.5–2.5)

## 2020-02-16 LAB — COMPREHENSIVE METABOLIC PANEL
ALT: 16 U/L (ref 0–35)
AST: 17 U/L (ref 0–37)
Albumin: 3.8 g/dL (ref 3.5–5.2)
Alkaline Phosphatase: 90 U/L (ref 39–117)
BUN: 14 mg/dL (ref 6–23)
CO2: 31 mEq/L (ref 19–32)
Calcium: 9.4 mg/dL (ref 8.4–10.5)
Chloride: 102 mEq/L (ref 96–112)
Creatinine, Ser: 0.74 mg/dL (ref 0.40–1.20)
GFR: 77.67 mL/min (ref >60.00)
Glucose, Bld: 90 mg/dL (ref 70–99)
Potassium: 4.5 mEq/L (ref 3.5–5.1)
Sodium: 139 mEq/L (ref 135–145)
Total Bilirubin: 0.6 mg/dL (ref 0.2–1.2)
Total Protein: 6.2 g/dL (ref 6.0–8.3)

## 2020-02-16 LAB — TSH: TSH: 1.25 u[IU]/mL (ref 0.35–4.50)

## 2020-02-16 LAB — LIPID PANEL
Cholesterol: 248 mg/dL — ABNORMAL HIGH (ref 0–200)
HDL: 68.3 mg/dL (ref >39.00)
LDL Cholesterol: 148 mg/dL — ABNORMAL HIGH (ref 0–99)
NonHDL: 179.37
Total CHOL/HDL Ratio: 4
Triglycerides: 158 mg/dL — ABNORMAL HIGH (ref 0.0–149.0)
VLDL: 31.6 mg/dL (ref 0.0–40.0)

## 2020-02-16 LAB — HEMOGLOBIN A1C: Hgb A1c MFr Bld: 5.2 % (ref 4.6–6.5)

## 2020-02-16 NOTE — Assessment & Plan Note (Signed)
Encouraged heart healthy diet, increase exercise, avoid trans fats, consider a krill oil cap daily 

## 2020-02-16 NOTE — Assessment & Plan Note (Signed)
On Levothyroxine, continue to monitor 

## 2020-02-16 NOTE — Assessment & Plan Note (Signed)
Well controlled, no changes to meds. Encouraged heart healthy diet such as the DASH diet and exercise as tolerated.  °

## 2020-02-16 NOTE — Progress Notes (Signed)
 Subjective:    [name], female    DOB: 07/10/1951, 69 y.o.   MRN: [id number]  Chief Complaint  Patient presents with  . Annual Exam    HPI Patient is in today for annual preventative exam and follow up on chronic medical concerns. No recent febrile illness or hospitalizations. She is very frustrated with her weight gain and her inability to loose more. She has to get down around 220 to get her left knee replaced. She has been home more than the pandemic. She has been eating more than usual. Is trying to exercise but her knee stops her. She tolerated the Pfizer shots. Denies CP/palp/SOB/HA/congestion/fevers/GI or GU c/o. Taking meds as prescribed.  Past Medical History:  Diagnosis Date  . Abnormal nuclear stress test 08/23/2015  . Arthritis   . Chicken pox as a child  . Essential hypertension 07/17/2011   ACEI d/c 06/2011 for refractory cough> resolved   . GERD (gastroesophageal reflux disease)   . Hyperlipidemia   . Hypertension   . Hypothyroidism   . Insomnia 11/01/2014  . Low back pain radiating to right leg    intermittent and occasional numbness of skin over right hip in certain positions  . Measles as a child  . Mumps as a child  . Obesity   . Pneumonia    this October from which she has said inhailer  . Recurrent epistaxis 02/20/2012  . Rheumatoid arthritis (HCC) 09/10/2011   Bone on bone in knees Pain in L>R knees.   . RLS (restless legs syndrome) 11/01/2014    Past Surgical History:  Procedure Laterality Date  . CARDIAC CATHETERIZATION N/A 08/23/2015   Procedure: Left Heart Cath and Coronary Angiography;  Surgeon: Michael Cooper, MD;  Location: [facility];  Service: Cardiovascular;  Laterality: N/A;  . CESAREAN SECTION     X 3  . GASTRECTOMY  12/2017  . INGUINAL HERNIA REPAIR Right 69 yrs old  . THYROIDECTOMY     total for benign tumor, Parathyroid spared  . TONSILLECTOMY    . TOTAL HIP ARTHROPLASTY Right 2006   secondary to congenital   hip defect    Family History  Problem Relation Age of Onset  . Lung cancer Mother 60       smoker  . Heart Problems Mother        tachycardia  . Hearing loss Mother   . Anxiety disorder Mother        anxiety, claustrophobia  . Osteoarthritis Father   . Hyperlipidemia Father   . Anuerysm Father        AAA  . Hyperlipidemia Sister   . Migraines Sister   . Hypertension Sister   . Colon cancer Maternal Grandmother   . Heart attack Maternal Grandfather   . Aneurysm Paternal Grandfather        abdominal  . Heart disease Paternal Grandfather        AAA rupture, smoker  . Heart Problems Sister        tachycardia    Social History   Socioeconomic History  . Marital status: Married    Spouse name: Not on file  . Number of children: 3  . Years of education: Not on file  . Highest education level: Not on file  Occupational History  . Occupation: artist  . Occupation: theologian  Tobacco Use  . Smoking status: Former Smoker    Packs/day: 0.50    Years: 5.00    Pack years: 2.50      Types: Cigarettes    Quit date: 07/25/1971    Years since quitting: 48.5  . Smokeless tobacco: Never Used  Vaping Use  . Vaping Use: Never used  Substance and Sexual Activity  . Alcohol use: Yes    Comment: OCC  . Drug use: No  . 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 Not on file  Social History Narrative  . Not on file   Social Determinants of Health   Financial Resource Strain:   . Difficulty of Paying Living Expenses:   Food Insecurity:   . Worried About Running Out of Food in the Last Year:   . Ran Out of Food in the Last Year:   Transportation Needs:   . Lack of Transportation (Medical):   . Lack of Transportation (Non-Medical):   Physical Activity:   . Days of Exercise per Week:   . Minutes of Exercise per Session:   Stress:   . Feeling of Stress :   Social Connections:   . Frequency of Communication  with Friends and Family:   . Frequency of Social Gatherings with Friends and Family:   . Attends Religious Services:   . Active Member of Clubs or Organizations:   . Attends Club or Organization Meetings:   . Marital Status:   Intimate Partner Violence:   . Fear of Current or Ex-Partner:   . Emotionally Abused:   . Physically Abused:   . Sexually Abused:     Outpatient Medications Prior to Visit  Medication Sig Dispense Refill  . albuterol (PROAIR HFA) 108 (90 Base) MCG/ACT inhaler Inhale 2 puffs into the lungs every 6 (six) hours as needed for wheezing or shortness of breath. 8.5 Inhaler 6  . aspirin EC 81 MG tablet Take 1 tablet (81 mg total) by mouth daily.    . buPROPion (WELLBUTRIN XL) 150 MG 24 hr tablet Take 150 mg by mouth at bedtime.    . cetirizine (ZYRTEC) 10 MG tablet TAKE 1 TABLET BY MOUTH DAILY 90 tablet 1  . conjugated estrogens (PREMARIN) vaginal cream Place vaginally 2 (two) times a week. 30 g 2  . Cyanocobalamin (VITAMIN B-12) 1000 MCG SUBL Place under the tongue.    . D3-1000 25 MCG (1000 UT) capsule TAKE 1 CAPSULE BY MOUTH EVERY DAY 90 capsule 2  . diclofenac sodium (VOLTAREN) 1 % GEL APPLY 2-4 GRAMS TOPICALLY TO AFFECTED JOINT UP TO 4 TIMES DAILY 400 g 2  . famotidine (PEPCID) 20 MG tablet TAKE 1 TABLET(20 MG) BY MOUTH AT BEDTIME 90 tablet 3  . fluticasone (FLONASE) 50 MCG/ACT nasal spray SHAKE LIQUID AND USE 2 SPRAYS IN EACH NOSTRIL DAILY 48 g 3  . furosemide (LASIX) 20 MG tablet TAKE 2 TABLETS BY MOUTH DAILY FOR 5 DAYS THEN DECREASE TO 1 TABLET BY MOUTH DAILY AS NEEDED 180 tablet 0  . hydrochlorothiazide (HYDRODIURIL) 25 MG tablet TAKE 1 TABLET BY MOUTH DAILY 90 tablet 1  . levothyroxine (SYNTHROID) 100 MCG tablet TAKE 1 TABLET BY MOUTH DAILY BEFORE BREAKFAST. 90 tablet 1  . MELATONIN PO Take 1 tablet by mouth at bedtime as needed (sleep).    . metoprolol tartrate (LOPRESSOR) 50 MG tablet TAKE 1 TABLET(50 MG) BY MOUTH TWICE DAILY 180 tablet 1  . nitroGLYCERIN  (NITROSTAT) 0.4 MG SL tablet Place 1 tablet (0.4 mg total) under the tongue every 5 (five) minutes as needed for chest pain. 25 tablet 3  .   pantoprazole (PROTONIX) 40 MG tablet TAKE 1 TABLET BY MOUTH DAILY 90 tablet 1  . potassium chloride SA (K-DUR,KLOR-CON) 20 MEQ tablet TAKE 1 TABLET BY MOUTH DAILY AS NEEDED(DEPENDING ON LASIX USE) 90 tablet 3  . rOPINIRole (REQUIP) 0.5 MG tablet TAKE 1 TO 2 TABLETS BY MOUTH AT BEDTIME AS NEEDED 180 tablet 0  . simvastatin (ZOCOR) 40 MG tablet TAKE 1 TABLET BY MOUTH EVERY EVENING 90 tablet 1  . traMADol (ULTRAM) 50 MG tablet TAKE 1 TO 2 TABLETS(50 TO 100 MG) BY MOUTH EVERY 8 HOURS AS NEEDED 90 tablet 0  . zolpidem (AMBIEN) 10 MG tablet TAKE 1 TABLET(10 MG) BY MOUTH AT BEDTIME AS NEEDED FOR SLEEP 30 tablet 0  . benzonatate (TESSALON) 100 MG capsule Take 1 capsule (100 mg total) by mouth 2 (two) times daily as needed for cough. 30 capsule 0  . SENNA PO Take by mouth daily.    Marland Kitchen topiramate (TOPAMAX) 50 MG tablet TK 1/2 T PO AT DINNER FOR 1 WEEK THEN INCREASE TO A FULL TABLET THEREAFTER     No facility-administered medications prior to visit.    Allergies  Allergen Reactions  . Bee Venom Anaphylaxis  . Accupril [Quinapril Hcl] Cough  . Quinapril Cough  . Quinapril-Hydrochlorothiazide Cough    Review of Systems  Constitutional: Negative for chills, fever and malaise/fatigue.  HENT: Negative for congestion and hearing loss.   Eyes: Negative for discharge.  Respiratory: Negative for cough, sputum production and shortness of breath.   Cardiovascular: Negative for chest pain, palpitations and leg swelling.  Gastrointestinal: Negative for abdominal pain, blood in stool, constipation, diarrhea, heartburn, nausea and vomiting.  Genitourinary: Negative for dysuria, frequency, hematuria and urgency.  Musculoskeletal: Negative for back pain, falls and myalgias.  Skin: Negative for rash.  Neurological: Negative for dizziness, sensory change, loss of consciousness,  weakness and headaches.  Endo/Heme/Allergies: Negative for environmental allergies. Does not bruise/bleed easily.  Psychiatric/Behavioral: Negative for depression and suicidal ideas. The patient is not nervous/anxious and does not have insomnia.        Objective:    Physical Exam Constitutional:      General: She is not in acute distress.    Appearance: She is well-developed.  HENT:     Head: Normocephalic and atraumatic.  Eyes:     Conjunctiva/sclera: Conjunctivae normal.  Neck:     Thyroid: No thyromegaly.  Cardiovascular:     Rate and Rhythm: Normal rate and regular rhythm.     Heart sounds: Normal heart sounds. No murmur heard.   Pulmonary:     Effort: Pulmonary effort is normal. No respiratory distress.     Breath sounds: Normal breath sounds.  Abdominal:     General: Bowel sounds are normal. There is no distension.     Palpations: Abdomen is soft. There is no mass.     Tenderness: There is no abdominal tenderness.  Musculoskeletal:     Cervical back: Neck supple.  Lymphadenopathy:     Cervical: No cervical adenopathy.  Skin:    General: Skin is warm and dry.  Neurological:     Mental Status: She is alert and oriented to person, place, and time.  Psychiatric:        Behavior: Behavior normal.     BP 116/68 (BP Location: Left Wrist, Cuff Size: Normal)   Pulse 69   Temp (!) 97.5 F (36.4 C) (Oral)   Resp 12   Ht 5' 2" (1.575 m)   Wt (!) 271 lb 12.8  oz (123.3 kg)   SpO2 97%   BMI 49.71 kg/m  Wt Readings from Last 3 Encounters:  02/16/20 (!) 271 lb 12.8 oz (123.3 kg)  11/28/19 259 lb 12.8 oz (117.8 kg)  09/16/19 270 lb (122.5 kg)    Diabetic Foot Exam - Simple   No data filed     Lab Results  Component Value Date   WBC 9.9 11/28/2019   HGB 14.1 11/28/2019   HCT 43.7 11/28/2019   PLT 375 11/28/2019   GLUCOSE 88 05/14/2018   CHOL 177 05/14/2018   TRIG 178.0 (H) 05/14/2018   HDL 52.60 05/14/2018   LDLCALC 89 05/14/2018   ALT 14 05/14/2018   AST  13 05/14/2018   NA 141 05/14/2018   K 4.5 05/14/2018   CL 102 05/14/2018   CREATININE 0.73 05/14/2018   BUN 21 05/14/2018   CO2 33 (H) 05/14/2018   TSH 1.27 05/14/2018   INR 0.99 08/19/2015    Lab Results  Component Value Date   TSH 1.27 05/14/2018   Lab Results  Component Value Date   WBC 9.9 11/28/2019   HGB 14.1 11/28/2019   HCT 43.7 11/28/2019   MCV 93.6 11/28/2019   PLT 375 11/28/2019   Lab Results  Component Value Date   NA 141 05/14/2018   K 4.5 05/14/2018   CHLORIDE 103 12/21/2016   CO2 33 (H) 05/14/2018   GLUCOSE 88 05/14/2018   BUN 21 05/14/2018   CREATININE 0.73 05/14/2018   BILITOT 0.6 05/14/2018   ALKPHOS 96 05/14/2018   AST 13 05/14/2018   ALT 14 05/14/2018   PROT 6.1 05/14/2018   ALBUMIN 3.8 05/14/2018   CALCIUM 9.6 05/14/2018   ANIONGAP 12 (H) 12/21/2017   EGFR 73 (L) 12/21/2016   GFR 84.30 05/14/2018   Lab Results  Component Value Date   CHOL 177 05/14/2018   Lab Results  Component Value Date   HDL 52.60 05/14/2018   Lab Results  Component Value Date   LDLCALC 89 05/14/2018   Lab Results  Component Value Date   TRIG 178.0 (H) 05/14/2018   Lab Results  Component Value Date   CHOLHDL 3 05/14/2018   No results found for: HGBA1C     Assessment & Plan:   Problem List Items Addressed This Visit    Morbid obesity (HCC)    Encouraged DASH diet, decrease po intake and increase exercise as tolerated. Needs 7-8 hours of sleep nightly. Avoid trans fats, eat small, frequent meals every 4-5 hours with lean proteins, complex carbs and healthy fats. Minimize simple carbs, working with weight loss program at [facility] Dr Barton. They have started her on Wellbutrin to manage the cravings.       Hypothyroidism    On Levothyroxine, continue to monitor      Hyperlipidemia - Primary    Encouraged heart healthy diet, increase exercise, avoid trans fats, consider a krill oil cap daily      Essential hypertension    Well controlled, no changes to  meds. Encouraged heart healthy diet such as the DASH diet and exercise as tolerated.       Arthralgia    Is following with rheumatology and she stru      Annual physical exam    Patient encouraged to maintain heart healthy diet, regular exercise, adequate sleep. Consider daily probiotics. Take medications as prescribed. Labs ordered and reviewed.       Primary osteoarthritis of both knees    Left knee is unstable and   hurts, she uses a cane to help with stability now. That helps some.       Muscle cramps    Hydrate and monitor labs      Hyperglycemia    hgba1c acceptable, minimize simple carbs. Increase exercise as tolerated.         I have discontinued Reverie C. Talbot's SENNA PO, topiramate, and benzonatate. I am also having her maintain her aspirin EC, nitroGLYCERIN, MELATONIN PO, potassium chloride SA, Vitamin B-12, furosemide, zolpidem, albuterol, diclofenac sodium, cetirizine, simvastatin, hydrochlorothiazide, famotidine, D3-1000, levothyroxine, pantoprazole, rOPINIRole, traMADol, fluticasone, Premarin, metoprolol tartrate, and buPROPion.  No orders of the defined types were placed in this encounter.    Penni Homans, MD

## 2020-02-16 NOTE — Assessment & Plan Note (Signed)
hgba1c acceptable, minimize simple carbs. Increase exercise as tolerated.  

## 2020-02-16 NOTE — Assessment & Plan Note (Signed)
Hydrate and monitor labs 

## 2020-02-16 NOTE — Assessment & Plan Note (Addendum)
Left knee is unstable and hurts, she uses a cane to help with stability now. That helps some. Is considering a power mobility device to help with mobility

## 2020-02-16 NOTE — Assessment & Plan Note (Signed)
Is following with rheumatology and she stru

## 2020-02-16 NOTE — Patient Instructions (Signed)
Preventive Care 38 Years and Older, Female Preventive care refers to lifestyle choices and visits with your health care provider that can promote health and wellness. This includes:  A yearly physical exam. This is also called an annual well check.  Regular dental and eye exams.  Immunizations.  Screening for certain conditions.  Healthy lifestyle choices, such as diet and exercise. What can I expect for my preventive care visit? Physical exam Your health care provider will check:  Height and weight. These may be used to calculate body mass index (BMI), which is a measurement that tells if you are at a healthy weight.  Heart rate and blood pressure.  Your skin for abnormal spots. Counseling Your health care provider may ask you questions about:  Alcohol, tobacco, and drug use.  Emotional well-being.  Home and relationship well-being.  Sexual activity.  Eating habits.  History of falls.  Memory and ability to understand (cognition).  Work and work Statistician.  Pregnancy and menstrual history. What immunizations do I need?  Influenza (flu) vaccine  This is recommended every year. Tetanus, diphtheria, and pertussis (Tdap) vaccine  You may need a Td booster every 10 years. Varicella (chickenpox) vaccine  You may need this vaccine if you have not already been vaccinated. Zoster (shingles) vaccine  You may need this after age 70. Pneumococcal conjugate (PCV13) vaccine  One dose is recommended after age 70. Pneumococcal polysaccharide (PPSV23) vaccine  One dose is recommended after age 70. Measles, mumps, and rubella (MMR) vaccine  You may need at least one dose of MMR if you were born in 1957 or later. You may also need a second dose. Meningococcal conjugate (MenACWY) vaccine  You may need this if you have certain conditions. Hepatitis A vaccine  You may need this if you have certain conditions or if you travel or work in places where you may be exposed  to hepatitis A. Hepatitis B vaccine  You may need this if you have certain conditions or if you travel or work in places where you may be exposed to hepatitis B. Haemophilus influenzae type b (Hib) vaccine  You may need this if you have certain conditions. You may receive vaccines as individual doses or as more than one vaccine together in one shot (combination vaccines). Talk with your health care provider about the risks and benefits of combination vaccines. What tests do I need? Blood tests  Lipid and cholesterol levels. These may be checked every 5 years, or more frequently depending on your overall health.  Hepatitis C test.  Hepatitis B test. Screening  Lung cancer screening. You may have this screening every year starting at age 70 if you have a 30-pack-year history of smoking and currently smoke or have quit within the past 15 years.  Colorectal cancer screening. All adults should have this screening starting at age 70 and continuing until age 15. Your health care provider may recommend screening at age 70 if you are at increased risk. You will have tests every 1-10 years, depending on your results and the type of screening test.  Diabetes screening. This is done by checking your blood sugar (glucose) after you have not eaten for a while (fasting). You may have this done every 1-3 years.  Mammogram. This may be done every 1-2 years. Talk with your health care provider about how often you should have regular mammograms.  BRCA-related cancer screening. This may be done if you have a family history of breast, ovarian, tubal, or peritoneal cancers.  Other tests  Sexually transmitted disease (STD) testing.  Bone density scan. This is done to screen for osteoporosis. You may have this done starting at age 70. Follow these instructions at home: Eating and drinking  Eat a diet that includes fresh fruits and vegetables, whole grains, lean protein, and low-fat dairy products. Limit  your intake of foods with high amounts of sugar, saturated fats, and salt.  Take vitamin and mineral supplements as recommended by your health care provider.  Do not drink alcohol if your health care provider tells you not to drink.  If you drink alcohol: ? Limit how much you have to 0-1 drink a day. ? Be aware of how much alcohol is in your drink. In the [place] one drink equals one 12 oz bottle of beer (355 mL), one 5 oz glass of wine (148 mL), or one 1 oz glass of hard liquor (44 mL). Lifestyle  Take daily care of your teeth and gums.  Stay active. Exercise for at least 30 minutes on 5 or more days each week.  Do not use any products that contain nicotine or tobacco, such as cigarettes, e-cigarettes, and chewing tobacco. If you need help quitting, ask your health care provider.  If you are sexually active, practice safe sex. Use a condom or other form of protection in order to prevent STIs (sexually transmitted infections).  Talk with your health care provider about taking a low-dose aspirin or statin. What's next?  Go to your health care provider once a year for a well check visit.  Ask your health care provider how often you should have your eyes and teeth checked.  Stay up to date on all vaccines. This information is not intended to replace advice given to you by your health care provider. Make sure you discuss any questions you have with your health care provider. Document Revised: 07/04/2018 Document Reviewed: 07/04/2018 Elsevier Patient Education  2020 [employer].

## 2020-02-16 NOTE — Assessment & Plan Note (Addendum)
Patient encouraged to maintain heart healthy diet, regular exercise, adequate sleep. Consider daily probiotics. Take medications as prescribed. Labs ordered and reviewed. She continues to follow with Dr Linda Hedges of GYN. Is due for pap and mammogram. colonocopy in 2017 repeat in 10 years.

## 2020-02-16 NOTE — Telephone Encounter (Signed)
Colguard ordered.

## 2020-02-16 NOTE — Assessment & Plan Note (Signed)
Encouraged DASH diet, decrease po intake and increase exercise as tolerated. Needs 7-8 hours of sleep nightly. Avoid trans fats, eat small, frequent meals every 4-5 hours with lean proteins, complex carbs and healthy fats. Minimize simple carbs, working with weight loss program at [facility] Dr Carron Brazen. They have started her on Wellbutrin to manage the cravings.

## 2020-02-17 ENCOUNTER — Other Ambulatory Visit: Payer: Self-pay

## 2020-02-17 ENCOUNTER — Other Ambulatory Visit: Payer: Self-pay | Admitting: Family Medicine

## 2020-02-17 DIAGNOSIS — E782 Mixed hyperlipidemia: Secondary | ICD-10-CM

## 2020-02-17 MED ORDER — ROSUVASTATIN CALCIUM 40 MG PO TABS: 40.0000 mg | ORAL_TABLET | Freq: Every day | ORAL | 1 refills | Status: DC

## 2020-02-25 ENCOUNTER — Other Ambulatory Visit: Payer: Self-pay | Admitting: Family Medicine

## 2020-03-02 ENCOUNTER — Other Ambulatory Visit: Payer: Self-pay | Admitting: Family Medicine

## 2020-03-03 NOTE — Telephone Encounter (Signed)
Requesting: tramadol  Contract:n/a UDS:05/14/18 Last Visit:02/16/20 Next Visit:n/a Last Refill:12/23/19  Please Advise

## 2020-03-03 NOTE — Progress Notes (Deleted)
Office Visit Note  Patient: Rebecca Brooks             Date of Birth: 22-Apr-1951           MRN: [id number]             PCP: Mosie Lukes, MD Referring: Mosie Lukes, MD Visit Date: 03/16/2020 Occupation: @GUAROCC @  Subjective:  No chief complaint on file.   History of Present Illness: Rebecca Brooks is a 69 y.o. female ***   Activities of Daily Living:  Patient reports morning stiffness for *** {minute/hour:19697}.   Patient {ACTIONS;DENIES/REPORTS:21021675::"Denies"} nocturnal pain.  Difficulty dressing/grooming: {ACTIONS;DENIES/REPORTS:21021675::"Denies"} Difficulty climbing stairs: {ACTIONS;DENIES/REPORTS:21021675::"Denies"} Difficulty getting out of chair: {ACTIONS;DENIES/REPORTS:21021675::"Denies"} Difficulty using hands for taps, buttons, cutlery, and/or writing: {ACTIONS;DENIES/REPORTS:21021675::"Denies"}  No Rheumatology ROS completed.   PMFS History:  Patient Active Problem List   Diagnosis Date Noted   Hyperglycemia 02/16/2020   Exposure to COVID-19 virus 10/16/2018   Atrophic vaginitis 05/14/2018   OSA (obstructive sleep apnea) 04/26/2018   Muscle cramps 01/02/2018   IDA (iron deficiency anemia) 12/22/2016   Primary osteoarthritis of both knees 10/03/2016   DDD (degenerative disc disease), cervical 10/03/2016   DDD (degenerative disc disease), lumbar 10/03/2016   History of total hip replacement, right 10/03/2016   History of hypothyroidism 10/03/2016   History of hypertension 10/03/2016   History of anemia 10/03/2016   Chondromalacia 10/03/2016   Primary osteoarthritis of shoulder 10/03/2016   Primary osteoarthritis of both hands 10/03/2016   Abnormal nuclear stress test 08/23/2015   Globus sensation 08/13/2015   Special screening for malignant neoplasms, colon 08/13/2015   Chest pain at rest    Chest pain with moderate risk of acute coronary syndrome 07/28/2015   Annual physical exam 07/11/2015   RLS (restless legs  syndrome) 11/01/2014   Insomnia 11/01/2014   Thoracic back pain 01/12/2014   Allergic rhinitis 11/03/2013   Preventative health care 12/13/2012   Recurrent epistaxis 02/20/2012   Shoulder pain, bilateral 11/01/2011   Arthralgia 10/18/2011   Peripheral edema 09/10/2011   Asthma 08/19/2011   Palpitations 08/01/2011   Anemia 08/01/2011   Hypokalemia 08/01/2011   Elevated WBC count 08/01/2011   GERD (gastroesophageal reflux disease)    Low back pain radiating to right leg    Morbid obesity ([place]) 07/17/2011   Hypothyroidism 07/17/2011   Hyperlipidemia 07/17/2011   Essential hypertension 07/17/2011    Past Medical History:  Diagnosis Date   Abnormal nuclear stress test 08/23/2015   Arthritis    Chicken pox as a child   Essential hypertension 07/17/2011   ACEI d/c 06/2011 for refractory cough> resolved    GERD (gastroesophageal reflux disease)    Hyperlipidemia    Hypertension    Hypothyroidism    Insomnia 11/01/2014   Low back pain radiating to right leg    intermittent and occasional numbness of skin over right hip in certain positions   Measles as a child   Mumps as a child   Obesity    Pneumonia    this October from which she has said inhailer   Recurrent epistaxis 02/20/2012   Rheumatoid arthritis ([place]) 09/10/2011   Bone on bone in knees Pain in L>R knees.    RLS (restless legs syndrome) 11/01/2014    Family History  Problem Relation Age of Onset   Lung cancer Mother 54       smoker   Heart Problems Mother        tachycardia   Hearing loss Mother  Anxiety disorder Mother        anxiety, claustrophobia   Osteoarthritis Father    Hyperlipidemia Father    Anuerysm Father        AAA   Hyperlipidemia Sister    Migraines Sister    Hypertension Sister    Colon cancer Maternal Grandmother    Heart attack Maternal Grandfather    Aneurysm Paternal Grandfather        abdominal   Heart disease Paternal Grandfather         AAA rupture, smoker   Heart Problems Sister        tachycardia   Past Surgical History:  Procedure Laterality Date   CARDIAC CATHETERIZATION N/A 08/23/2015   Procedure: Left Heart Cath and Coronary Angiography;  Surgeon: [name], MD;  Location: [facility];  Service: Cardiovascular;  Laterality: N/A;   CESAREAN SECTION     X 3   GASTRECTOMY  12/2017   INGUINAL HERNIA REPAIR Right 69 yrs old   THYROIDECTOMY     total for benign tumor, Parathyroid spared   TONSILLECTOMY     TOTAL HIP ARTHROPLASTY Right 2006   secondary to congenital  hip defect   Social History   Social History Narrative   Not on file   Immunization History  Administered Date(s) Administered   Fluad Quad(high Dose 65+) 04/19/2019   Influenza Split 07/19/2012   Influenza Whole 06/24/2011, 05/13/2013   Influenza, High Dose Seasonal PF 06/22/2017, 05/14/2018   Influenza,inj,Quad PF,6+ Mos 06/03/2016   Influenza-Unspecified 05/07/2014, 06/08/2015   PFIZER SARS-COV-2 Vaccination 08/12/2019, 09/03/2019   Pneumococcal Conjugate-13 05/14/2018   Pneumococcal Polysaccharide-23 07/18/2011   Td 07/24/2005   Tdap 08/01/2011   Zoster 07/23/2012     Objective: Vital Signs: There were no vitals taken for this visit.   Physical Exam   Musculoskeletal Exam: ***  CDAI Exam: CDAI Score: -- Patient Global: --; Provider Global: -- Swollen: --; Tender: -- Joint Exam 03/16/2020   No joint exam has been documented for this visit   There is currently no information documented on the homunculus. Go to the Rheumatology activity and complete the homunculus joint exam.  Investigation: No additional findings.  Imaging: No results found.  Recent Labs: Lab Results  Component Value Date   WBC 9.7 02/16/2020   HGB 13.5 02/16/2020   PLT 342.0 02/16/2020   NA 139 02/16/2020   K 4.5 02/16/2020   CL 102 02/16/2020   CO2 31 02/16/2020   GLUCOSE 90 02/16/2020   BUN 14 02/16/2020    CREATININE 0.74 02/16/2020   BILITOT 0.6 02/16/2020   ALKPHOS 90 02/16/2020   AST 17 02/16/2020   ALT 16 02/16/2020   PROT 6.2 02/16/2020   ALBUMIN 3.8 02/16/2020   CALCIUM 9.4 02/16/2020   GFRAA >60 12/21/2017    Speciality Comments: No specialty comments available.  Procedures:  No procedures performed Allergies: Bee venom, Accupril [quinapril hcl], Quinapril, and Quinapril-hydrochlorothiazide   Assessment / Plan:     Visit Diagnoses: No diagnosis found.  Orders: No orders of the defined types were placed in this encounter.  No orders of the defined types were placed in this encounter.   Face-to-face time spent with patient was *** minutes. Greater than 50% of time was spent in counseling and coordination of care.  Follow-Up Instructions: No follow-ups on file.   Earnestine Mealing, CMA  Note - This record has been created using Editor, commissioning.  Chart creation errors have been sought, but may not always  have been located. Such creation errors do not reflect on  the standard of medical care.

## 2020-03-08 ENCOUNTER — Other Ambulatory Visit: Payer: Self-pay | Admitting: Family Medicine

## 2020-03-08 DIAGNOSIS — Z1211 Encounter for screening for malignant neoplasm of colon: Secondary | ICD-10-CM | POA: Diagnosis not present

## 2020-03-08 LAB — COLOGUARD: Cologuard: NEGATIVE

## 2020-03-13 LAB — EXTERNAL GENERIC LAB PROCEDURE: COLOGUARD: NEGATIVE

## 2020-03-13 LAB — COLOGUARD: COLOGUARD: NEGATIVE

## 2020-03-15 ENCOUNTER — Encounter: Payer: Self-pay | Admitting: *Deleted

## 2020-03-16 ENCOUNTER — Ambulatory Visit: Payer: Medicare Other | Admitting: Rheumatology

## 2020-03-23 ENCOUNTER — Encounter: Payer: Self-pay | Admitting: Family Medicine

## 2020-03-25 ENCOUNTER — Encounter: Payer: Self-pay | Admitting: Family Medicine

## 2020-03-26 DIAGNOSIS — E8881 Metabolic syndrome: Secondary | ICD-10-CM | POA: Diagnosis not present

## 2020-03-26 DIAGNOSIS — Z6841 Body Mass Index (BMI) 40.0 and over, adult: Secondary | ICD-10-CM | POA: Diagnosis not present

## 2020-03-26 DIAGNOSIS — I10 Essential (primary) hypertension: Secondary | ICD-10-CM | POA: Diagnosis not present

## 2020-03-30 ENCOUNTER — Other Ambulatory Visit: Payer: Self-pay | Admitting: Family Medicine

## 2020-03-30 DIAGNOSIS — Z789 Other specified health status: Secondary | ICD-10-CM

## 2020-03-30 DIAGNOSIS — E782 Mixed hyperlipidemia: Secondary | ICD-10-CM

## 2020-04-01 ENCOUNTER — Other Ambulatory Visit: Payer: Self-pay

## 2020-04-01 ENCOUNTER — Ambulatory Visit: Payer: Medicare Other | Admitting: Rheumatology

## 2020-04-01 ENCOUNTER — Encounter: Payer: Self-pay | Admitting: Rheumatology

## 2020-04-01 VITALS — BP 121/76 | HR 80 | Resp 17 | Ht 62.0 in | Wt 269.6 lb

## 2020-04-01 DIAGNOSIS — Z96641 Presence of right artificial hip joint: Secondary | ICD-10-CM | POA: Diagnosis not present

## 2020-04-01 DIAGNOSIS — M19041 Primary osteoarthritis, right hand: Secondary | ICD-10-CM | POA: Diagnosis not present

## 2020-04-01 DIAGNOSIS — M5136 Other intervertebral disc degeneration, lumbar region: Secondary | ICD-10-CM

## 2020-04-01 DIAGNOSIS — R609 Edema, unspecified: Secondary | ICD-10-CM

## 2020-04-01 DIAGNOSIS — M5030 Other cervical disc degeneration, unspecified cervical region: Secondary | ICD-10-CM

## 2020-04-01 DIAGNOSIS — Z8639 Personal history of other endocrine, nutritional and metabolic disease: Secondary | ICD-10-CM

## 2020-04-01 DIAGNOSIS — M19011 Primary osteoarthritis, right shoulder: Secondary | ICD-10-CM | POA: Diagnosis not present

## 2020-04-01 DIAGNOSIS — R600 Localized edema: Secondary | ICD-10-CM

## 2020-04-01 DIAGNOSIS — Z7189 Other specified counseling: Secondary | ICD-10-CM

## 2020-04-01 DIAGNOSIS — Z862 Personal history of diseases of the blood and blood-forming organs and certain disorders involving the immune mechanism: Secondary | ICD-10-CM

## 2020-04-01 DIAGNOSIS — M8589 Other specified disorders of bone density and structure, multiple sites: Secondary | ICD-10-CM

## 2020-04-01 DIAGNOSIS — M19012 Primary osteoarthritis, left shoulder: Secondary | ICD-10-CM

## 2020-04-01 DIAGNOSIS — Z8679 Personal history of other diseases of the circulatory system: Secondary | ICD-10-CM

## 2020-04-01 DIAGNOSIS — M170 Bilateral primary osteoarthritis of knee: Secondary | ICD-10-CM

## 2020-04-01 DIAGNOSIS — F5101 Primary insomnia: Secondary | ICD-10-CM

## 2020-04-01 DIAGNOSIS — Z6841 Body Mass Index (BMI) 40.0 and over, adult: Secondary | ICD-10-CM

## 2020-04-01 DIAGNOSIS — M51369 Other intervertebral disc degeneration, lumbar region without mention of lumbar back pain or lower extremity pain: Secondary | ICD-10-CM

## 2020-04-01 DIAGNOSIS — M19042 Primary osteoarthritis, left hand: Secondary | ICD-10-CM

## 2020-04-01 MED ORDER — TRIAMCINOLONE ACETONIDE 40 MG/ML IJ SUSP
40.0000 mg | INTRAMUSCULAR | Status: AC | PRN
  Administered 2020-04-01: 40 mg via INTRA_ARTICULAR

## 2020-04-01 MED ORDER — LIDOCAINE HCL 1 % IJ SOLN
1.5000 mL | INTRAMUSCULAR | Status: AC | PRN
  Administered 2020-04-01: 1.5 mL

## 2020-04-01 NOTE — Progress Notes (Signed)
Office Visit Note  Patient: Rebecca Brooks             Date of Birth: 1950/08/02           MRN: [id number]             PCP: Mosie Lukes, MD Referring: Mosie Lukes, MD Visit Date: 04/01/2020 Occupation: @GUAROCC @  Subjective:  Other (bilateral knee pain, bilateral hand pain, right shoulder pain )   History of Present Illness: Rebecca Brooks is a 69 y.o. female with history of osteoarthritis.  She states she was placed on Crestor lately and is started experiencing increased pain in her joints and muscles.  She stopped Crestor and has noticed improvement in her pain symptoms.  She continues to have some discomfort in her right shoulder, bilateral hands and her bilateral knee joints.  She believes sometimes her hands swell.  She has disc disease of cervical and lumbar spine but it is not bothersome currently.  Activities of Daily Living:  Patient reports morning stiffness for  30 minutes.   Patient Reports nocturnal pain.  Difficulty dressing/grooming: Denies Difficulty climbing stairs: Reports Difficulty getting out of chair: Reports Difficulty using hands for taps, buttons, cutlery, and/or writing: Reports  Review of Systems  Constitutional: Negative for fatigue.  HENT: Negative for mouth sores, mouth dryness and nose dryness.   Eyes: Negative for itching and dryness.  Respiratory: Negative for shortness of breath and difficulty breathing.   Cardiovascular: Negative for chest pain and palpitations.  Gastrointestinal: Negative for blood in stool, constipation and diarrhea.  Endocrine: Negative for increased urination.  Genitourinary: Negative for difficulty urinating.  Musculoskeletal: Positive for arthralgias, joint pain, myalgias, morning stiffness, muscle tenderness and myalgias. Negative for joint swelling and muscle weakness.  Skin: Negative for color change, rash and redness.  Allergic/Immunologic: Negative for susceptible to infections.  Neurological: Negative for  dizziness, headaches and memory loss.  Hematological: Negative for bruising/bleeding tendency.  Psychiatric/Behavioral: Negative for confusion.    PMFS History:  Patient Active Problem List   Diagnosis Date Noted  . Hyperglycemia 02/16/2020  . Exposure to COVID-19 virus 10/16/2018  . Atrophic vaginitis 05/14/2018  . OSA (obstructive sleep apnea) 04/26/2018  . Muscle cramps 01/02/2018  . IDA (iron deficiency anemia) 12/22/2016  . Primary osteoarthritis of both knees 10/03/2016  . DDD (degenerative disc disease), cervical 10/03/2016  . DDD (degenerative disc disease), lumbar 10/03/2016  . History of total hip replacement, right 10/03/2016  . History of hypothyroidism 10/03/2016  . History of hypertension 10/03/2016  . History of anemia 10/03/2016  . Chondromalacia 10/03/2016  . Primary osteoarthritis of shoulder 10/03/2016  . Primary osteoarthritis of both hands 10/03/2016  . Abnormal nuclear stress test 08/23/2015  . Globus sensation 08/13/2015  . Special screening for malignant neoplasms, colon 08/13/2015  . Chest pain at rest   . Chest pain with moderate risk of acute coronary syndrome 07/28/2015  . Annual physical exam 07/11/2015  . RLS (restless legs syndrome) 11/01/2014  . Insomnia 11/01/2014  . Thoracic back pain 01/12/2014  . Allergic rhinitis 11/03/2013  . Preventative health care 12/13/2012  . Recurrent epistaxis 02/20/2012  . Shoulder pain, bilateral 11/01/2011  . Arthralgia 10/18/2011  . Peripheral edema 09/10/2011  . Asthma 08/19/2011  . Palpitations 08/01/2011  . Anemia 08/01/2011  . Hypokalemia 08/01/2011  . Elevated WBC count 08/01/2011  . GERD (gastroesophageal reflux disease)   . Low back pain radiating to right leg   . Morbid obesity ([place]) 07/17/2011  .  Hypothyroidism 07/17/2011  . Hyperlipidemia 07/17/2011  . Essential hypertension 07/17/2011    Past Medical History:  Diagnosis Date  . Abnormal nuclear stress test 08/23/2015  . Arthritis   .  Chicken pox as a child  . Essential hypertension 07/17/2011   ACEI d/c 06/2011 for refractory cough> resolved   . GERD (gastroesophageal reflux disease)   . Hyperlipidemia   . Hypertension   . Hypothyroidism   . Insomnia 11/01/2014  . Low back pain radiating to right leg    intermittent and occasional numbness of skin over right hip in certain positions  . Measles as a child  . Mumps as a child  . Obesity   . Pneumonia    this October from which she has said inhailer  . Recurrent epistaxis 02/20/2012  . Rheumatoid arthritis ([place]) 09/10/2011   Bone on bone in knees Pain in L>R knees.   . RLS (restless legs syndrome) 11/01/2014    Family History  Problem Relation Age of Onset  . Lung cancer Mother 59       smoker  . Heart Problems Mother        tachycardia  . Hearing loss Mother   . Anxiety disorder Mother        anxiety, claustrophobia  . Osteoarthritis Father   . Hyperlipidemia Father   . Anuerysm Father        AAA  . Hyperlipidemia Sister   . Migraines Sister   . Hypertension Sister   . Colon cancer Maternal Grandmother   . Heart attack Maternal Grandfather   . Aneurysm Paternal Grandfather        abdominal  . Heart disease Paternal Grandfather        AAA rupture, smoker  . Heart Problems Sister        tachycardia   Past Surgical History:  Procedure Laterality Date  . CARDIAC CATHETERIZATION N/A 08/23/2015   Procedure: Left Heart Cath and Coronary Angiography;  Surgeon: [name], MD;  Location: [facility];  Service: Cardiovascular;  Laterality: N/A;  . CESAREAN SECTION     X 3  . GASTRECTOMY  12/2017  . INGUINAL HERNIA REPAIR Right 69 yrs old  . THYROIDECTOMY     total for benign tumor, Parathyroid spared  . TONSILLECTOMY    . TOTAL HIP ARTHROPLASTY Right 2006   secondary to congenital  hip defect   Social History   Social History Narrative  . Not on file   Immunization History  Administered Date(s) Administered  . Fluad Quad(high Dose 65+)  04/19/2019  . Influenza Split 07/19/2012  . Influenza Whole 06/24/2011, 05/13/2013  . Influenza, High Dose Seasonal PF 06/22/2017, 05/14/2018  . Influenza,inj,Quad PF,6+ Mos 06/03/2016  . Influenza-Unspecified 05/07/2014, 06/08/2015  . PFIZER SARS-COV-2 Vaccination 08/12/2019, 09/03/2019  . Pneumococcal Conjugate-13 05/14/2018  . Pneumococcal Polysaccharide-23 07/18/2011  . Td 07/24/2005  . Tdap 08/01/2011  . Zoster 07/23/2012     Objective: Vital Signs: BP 121/76 (BP Location: Left Wrist, Patient Position: Sitting, Cuff Size: Normal)   Pulse 80   Resp 17   Ht 5\' 2"  (1.575 m)   Wt 269 lb 9.6 oz (122.3 kg)   BMI 49.31 kg/m    Physical Exam Vitals and nursing note reviewed.  Constitutional:      Appearance: She is well-developed.  HENT:     Head: Normocephalic and atraumatic.  Eyes:     Conjunctiva/sclera: Conjunctivae normal.  Cardiovascular:     Rate and Rhythm: Normal rate and regular  rhythm.     Heart sounds: Normal heart sounds.  Pulmonary:     Effort: Pulmonary effort is normal.     Breath sounds: Normal breath sounds.  Abdominal:     General: Bowel sounds are normal.     Palpations: Abdomen is soft.  Musculoskeletal:     Cervical back: Normal range of motion.  Lymphadenopathy:     Cervical: No cervical adenopathy.  Skin:    General: Skin is warm and dry.     Capillary Refill: Capillary refill takes less than 2 seconds.  Neurological:     Mental Status: She is alert and oriented to person, place, and time.  Psychiatric:        Behavior: Behavior normal.      Musculoskeletal Exam: C-spine was in good range of motion.  She has limited range of motion of her right shoulder joint.  She has severe PIP and DIP thickening and subluxation of several of those joints.  Hip joints with good range of motion.  She has warmth on palpation of bilateral knee joints without any effusion.  She has some DIP and PIP changes in her feet.  CDAI Exam: CDAI Score: -- Patient  Global: --; Provider Global: -- Swollen: --; Tender: -- Joint Exam 04/01/2020   No joint exam has been documented for this visit   There is currently no information documented on the homunculus. Go to the Rheumatology activity and complete the homunculus joint exam.  Investigation: No additional findings.  Imaging: No results found.  Recent Labs: Lab Results  Component Value Date   WBC 9.7 02/16/2020   HGB 13.5 02/16/2020   PLT 342.0 02/16/2020   NA 139 02/16/2020   K 4.5 02/16/2020   CL 102 02/16/2020   CO2 31 02/16/2020   GLUCOSE 90 02/16/2020   BUN 14 02/16/2020   CREATININE 0.74 02/16/2020   BILITOT 0.6 02/16/2020   ALKPHOS 90 02/16/2020   AST 17 02/16/2020   ALT 16 02/16/2020   PROT 6.2 02/16/2020   ALBUMIN 3.8 02/16/2020   CALCIUM 9.4 02/16/2020   GFRAA >60 12/21/2017    Speciality Comments: No specialty comments available.  Procedures:  Large Joint Inj: bilateral knee on 04/01/2020 1:58 PM Indications: pain Details: 27 G 1.5 in needle, medial approach  Arthrogram: No  Medications (Right): 1.5 mL lidocaine 1 %; 40 mg triamcinolone acetonide 40 MG/ML Aspirate (Right): 0 mL Medications (Left): 1.5 mL lidocaine 1 %; 40 mg triamcinolone acetonide 40 MG/ML Aspirate (Left): 0 mL Outcome: tolerated well, no immediate complications Procedure, treatment alternatives, risks and benefits explained, specific risks discussed. Consent was given by the patient. Immediately prior to procedure a time out was called to verify the correct patient, procedure, equipment, support staff and site/side marked as required. Patient was prepped and draped in the usual sterile fashion.     Allergies: Bee venom, Accupril [quinapril hcl], Quinapril, and Quinapril-hydrochlorothiazide   Assessment / Plan:     Visit Diagnoses: Primary osteoarthritis of both hands-she continues to have discomfort in her hands.  No synovitis was noted.  Patient has history of intermittent swelling.  DIP and  PIP thickening and subluxation was noted.  Joint protection was discussed.  Primary osteoarthritis of both shoulders-she has limited range of motion of her right shoulder joint.  Primary osteoarthritis of both knees -I reviewed previous x-rays of her knee joints.  She has bilateral severe end-stage osteoarthritis of the knee joints.  She will need bilateral total knee replacement.  She is unable  to have surgery due to high BMI.  Weight loss diet and exercise was discussed.  She has been going to [facility] and has not been very successful.  I will give her information about the [facility] weight loss program.  Patient requests injection to bilateral knee joints.  Bilateral knee joint cortisone injections were performed per her request as described above.  She tolerated the procedure well.  Postprocedure instructions were given.  History of total hip replacement, right-doing well.  DDD (degenerative disc disease), cervical-she is currently not having much discomfort.  DDD (degenerative disc disease), lumbar-she has off-and-on discomfort but currently asymptomatic.  Osteopenia of multiple sites -right reviewed her DEXA today.  10/16/2015 DEXA - BMD measured at Femur Neck is 0.866 g/cm2 with a T-score of-1.2.  I have advised patient to discuss repeat DEXA with her PCP.  Use of calcium rich diet and exercise was discussed.  BMI 45.0-49.9, adult (HCC)-have given her information regarding the weight loss program.  Educated about COVID-19 virus infection-she is fully immunized against COVID-19.  Have advised her to get the booster once available to her.  Use of mask, social distancing and hand hygiene was discussed.  I also discussed that she may be eligible for monoclonal antibody infusion if she requires COVID-19 infection.  Other medical problems are listed as follows:  Primary insomnia  Peripheral edema  History of hypothyroidism  History of hypertension  History of  anemia  History of high cholesterol  Orders: Orders Placed This Encounter  Procedures  . Large Joint Inj   No orders of the defined types were placed in this encounter.   Follow-Up Instructions: Return in about 6 months (around 09/29/2020) for Osteoarthritis.   Bo Merino, MD  Note - This record has been created using Editor, commissioning.  Chart creation errors have been sought, but may not always  have been located. Such creation errors do not reflect on  the standard of medical care.

## 2020-04-06 ENCOUNTER — Other Ambulatory Visit: Payer: Self-pay | Admitting: Radiology

## 2020-04-06 DIAGNOSIS — Z6841 Body Mass Index (BMI) 40.0 and over, adult: Secondary | ICD-10-CM | POA: Diagnosis not present

## 2020-04-06 DIAGNOSIS — Z01419 Encounter for gynecological examination (general) (routine) without abnormal findings: Secondary | ICD-10-CM | POA: Diagnosis not present

## 2020-04-06 DIAGNOSIS — N644 Mastodynia: Secondary | ICD-10-CM

## 2020-04-16 ENCOUNTER — Ambulatory Visit: Payer: Medicare Other | Admitting: Rheumatology

## 2020-04-20 ENCOUNTER — Other Ambulatory Visit: Payer: Self-pay

## 2020-04-20 ENCOUNTER — Ambulatory Visit
Admission: RE | Admit: 2020-04-20 | Discharge: 2020-04-20 | Disposition: A | Discharge status: Home / Self Care | Payer: Medicare Other | Source: Ambulatory Visit | Attending: Radiology | Admitting: Radiology

## 2020-04-20 DIAGNOSIS — R928 Other abnormal and inconclusive findings on diagnostic imaging of breast: Secondary | ICD-10-CM | POA: Diagnosis not present

## 2020-04-20 DIAGNOSIS — R350 Frequency of micturition: Secondary | ICD-10-CM | POA: Diagnosis not present

## 2020-04-20 DIAGNOSIS — N644 Mastodynia: Secondary | ICD-10-CM

## 2020-04-20 DIAGNOSIS — N3001 Acute cystitis with hematuria: Secondary | ICD-10-CM | POA: Diagnosis not present

## 2020-04-21 ENCOUNTER — Telehealth: Payer: Self-pay | Admitting: Internal Medicine

## 2020-04-21 NOTE — Telephone Encounter (Signed)
New message:     Patient would like to speak to some one concerning some medications changes. Please call patient.

## 2020-04-21 NOTE — Telephone Encounter (Signed)
Spoke with patient who was referred to lipid clinic by PCP She states she was changed from simvastatin to rosuvastatin d/t lipids not being well controlled She states she has myalgias on statins, tolerated simvastatin better -- had to stop rosuvastatin  She was concerned b/c her new lipid visit was not until Dec 2021 and she would be off therapy for 2.5-3 months Scheduled her a sooner visit on Nov 10. Advised she is had any questions about meds in meantime, to contact PCP She voiced understanding

## 2020-05-04 DIAGNOSIS — E669 Obesity, unspecified: Secondary | ICD-10-CM | POA: Diagnosis not present

## 2020-05-04 DIAGNOSIS — Z6841 Body Mass Index (BMI) 40.0 and over, adult: Secondary | ICD-10-CM | POA: Diagnosis not present

## 2020-05-26 DIAGNOSIS — Z6841 Body Mass Index (BMI) 40.0 and over, adult: Secondary | ICD-10-CM | POA: Diagnosis not present

## 2020-05-26 DIAGNOSIS — E669 Obesity, unspecified: Secondary | ICD-10-CM | POA: Diagnosis not present

## 2020-05-27 ENCOUNTER — Other Ambulatory Visit: Payer: Self-pay | Admitting: Family Medicine

## 2020-05-27 NOTE — Telephone Encounter (Signed)
Last written: 03/03/20 Last ov: 02/16/20 Next ov: 08/19/20 Contract: will get at next visit UDS: will get at next visit

## 2020-05-31 ENCOUNTER — Other Ambulatory Visit: Payer: Self-pay | Admitting: Family Medicine

## 2020-05-31 ENCOUNTER — Inpatient Hospital Stay: Payer: Medicare Other | Attending: Family Medicine

## 2020-05-31 ENCOUNTER — Inpatient Hospital Stay: Payer: Medicare Other | Admitting: Family

## 2020-06-02 ENCOUNTER — Encounter: Payer: Self-pay | Admitting: Internal Medicine

## 2020-06-02 ENCOUNTER — Other Ambulatory Visit: Payer: Self-pay

## 2020-06-02 ENCOUNTER — Ambulatory Visit (INDEPENDENT_AMBULATORY_CARE_PROVIDER_SITE_OTHER): Payer: Medicare Other | Admitting: Internal Medicine

## 2020-06-02 VITALS — BP 122/68 | HR 62 | Ht 62.0 in | Wt 267.8 lb

## 2020-06-02 DIAGNOSIS — E785 Hyperlipidemia, unspecified: Secondary | ICD-10-CM

## 2020-06-02 MED ORDER — SIMVASTATIN 40 MG PO TABS: 40.0000 mg | ORAL_TABLET | Freq: Every day | ORAL | 3 refills | Status: DC

## 2020-06-02 NOTE — Progress Notes (Signed)
LIPID CLINIC CONSULT NOTE  Chief Complaint:  Dyslipidemia  Primary Care Physician: Mosie Lukes, MD  Primary Cardiologist:  No primary care provider on file.  HPI:  Rebecca Brooks is a 69 y.o. female who is being seen today for the evaluation of dyslipidemia at the request of Mosie Lukes, MD.  This is a pleasant 69 year old female kindly referred by Dr. Randel Pigg for evaluation management of dyslipidemia.  She has no significant history of coronary disease in fact had symptoms and underwent stress testing which ultimately was a false positive in 2017 after she underwent left heart catheterization which showed normal coronaries.  She reports no significant family history of heart disease in either parents or siblings and no one that takes medications for cholesterol.  She does take medication for hypertension which is well controlled.  There is morbid obesity and she has issues with knee osteoarthritis that limits her activity.  She has been trying to lose weight.  More recently her cholesterol is felt to be poorly controlled.  It was recommended that she come off of her simvastatin and go onto rosuvastatin.  Labs in July 2021 showed total cholesterol 248, HDL 68, LDL 148 and triglycerides 158.  Prior to that, on simvastatin her total cholesterol was 177, triglycerides 178, HDL 52 and LDL of 89.  Given her paucity of risk factors and normal coronaries I think very aggressive treatment would target an LDL less than 100.  PMHx:  Past Medical History:  Diagnosis Date  . Abnormal nuclear stress test 08/23/2015  . Arthritis   . Chicken pox as a child  . Essential hypertension 07/17/2011   ACEI d/c 06/2011 for refractory cough> resolved   . GERD (gastroesophageal reflux disease)   . Hyperlipidemia   . Hypertension   . Hypothyroidism   . Insomnia 11/01/2014  . Low back pain radiating to right leg    intermittent and occasional numbness of skin over right hip in certain positions  .  Measles as a child  . Mumps as a child  . Obesity   . Pneumonia    this October from which she has said inhailer  . Recurrent epistaxis 02/20/2012  . Rheumatoid arthritis ([place]) 09/10/2011   Bone on bone in knees Pain in L>R knees.   . RLS (restless legs syndrome) 11/01/2014    Past Surgical History:  Procedure Laterality Date  . CARDIAC CATHETERIZATION N/A 08/23/2015   Procedure: Left Heart Cath and Coronary Angiography;  Surgeon: [name], MD;  Location: [facility];  Service: Cardiovascular;  Laterality: N/A;  . CESAREAN SECTION     X 3  . GASTRECTOMY  12/2017  . INGUINAL HERNIA REPAIR Right 69 yrs old  . THYROIDECTOMY     total for benign tumor, Parathyroid spared  . TONSILLECTOMY    . TOTAL HIP ARTHROPLASTY Right 2006   secondary to congenital  hip defect    FAMHx:  Family History  Problem Relation Age of Onset  . Lung cancer Mother 69       smoker  . Heart Problems Mother        tachycardia  . Hearing loss Mother   . Anxiety disorder Mother        anxiety, claustrophobia  . Osteoarthritis Father   . Hyperlipidemia Father   . Anuerysm Father        AAA  . Hyperlipidemia Sister   . Migraines Sister   . Hypertension Sister   . Colon cancer  Maternal Grandmother   . Heart attack Maternal Grandfather   . Aneurysm Paternal Grandfather        abdominal  . Heart disease Paternal Grandfather        AAA rupture, smoker  . Heart Problems Sister        tachycardia    SOCHx:   reports that she quit smoking about 48 years ago. Her smoking use included cigarettes. She has a 2.50 pack-year smoking history. She has never used smokeless tobacco. She reports current alcohol use. She reports that she does not use drugs.  ALLERGIES:  Allergies  Allergen Reactions  . Bee Venom Anaphylaxis  . Accupril [Quinapril Hcl] Cough  . Quinapril Cough  . Quinapril-Hydrochlorothiazide Cough  . Rosuvastatin Other (See Comments)    ROS: Pertinent items noted in HPI and  remainder of comprehensive ROS otherwise negative.  HOME MEDS: Current Outpatient Medications on File Prior to Visit  Medication Sig Dispense Refill  . albuterol (PROAIR HFA) 108 (90 Base) MCG/ACT inhaler Inhale 2 puffs into the lungs every 6 (six) hours as needed for wheezing or shortness of breath. 8.5 Inhaler 6  . aspirin EC 81 MG tablet Take 1 tablet (81 mg total) by mouth daily.    Marland Kitchen buPROPion (WELLBUTRIN XL) 150 MG 24 hr tablet Take 150 mg by mouth at bedtime.    . cetirizine (ZYRTEC) 10 MG tablet TAKE 1 TABLET BY MOUTH DAILY 90 tablet 1  . conjugated estrogens (PREMARIN) vaginal cream Place vaginally 2 (two) times a week. 30 g 2  . Cyanocobalamin (VITAMIN B-12) 1000 MCG SUBL Place under the tongue.    . D3-1000 25 MCG (1000 UT) capsule TAKE 1 CAPSULE BY MOUTH EVERY DAY 90 capsule 2  . diclofenac sodium (VOLTAREN) 1 % GEL APPLY 2-4 GRAMS TOPICALLY TO AFFECTED JOINT UP TO 4 TIMES DAILY 400 g 2  . famotidine (PEPCID) 20 MG tablet TAKE 1 TABLET(20 MG) BY MOUTH AT BEDTIME 90 tablet 3  . fluticasone (FLONASE) 50 MCG/ACT nasal spray SHAKE LIQUID AND USE 2 SPRAYS IN EACH NOSTRIL DAILY 48 g 3  . furosemide (LASIX) 20 MG tablet TAKE 2 TABLETS BY MOUTH DAILY FOR 5 DAYS THEN DECREASE TO 1 TABLET BY MOUTH DAILY AS NEEDED 180 tablet 0  . hydrochlorothiazide (HYDRODIURIL) 25 MG tablet TAKE 1 TABLET(25 MG) BY MOUTH DAILY 90 tablet 1  . levothyroxine (SYNTHROID) 100 MCG tablet TAKE 1 TABLET BY MOUTH DAILY BEFORE BREAKFAST. 90 tablet 1  . MELATONIN PO Take 1 tablet by mouth at bedtime as needed (sleep).    . metoprolol tartrate (LOPRESSOR) 50 MG tablet TAKE 1 TABLET(50 MG) BY MOUTH TWICE DAILY 180 tablet 1  . nitroGLYCERIN (NITROSTAT) 0.4 MG SL tablet Place 1 tablet (0.4 mg total) under the tongue every 5 (five) minutes as needed for chest pain. 25 tablet 3  . pantoprazole (PROTONIX) 40 MG tablet Take 1 tablet (40 mg total) by mouth daily. 90 tablet 3  . potassium chloride SA (K-DUR,KLOR-CON) 20 MEQ  tablet TAKE 1 TABLET BY MOUTH DAILY AS NEEDED(DEPENDING ON LASIX USE) 90 tablet 3  . rOPINIRole (REQUIP) 0.5 MG tablet TAKE 1 TO 2 TABLETS BY MOUTH AT BEDTIME AS NEEDED 180 tablet 0  . traMADol (ULTRAM) 50 MG tablet TAKE 1 TO 2 TABLETS(50 TO 100 MG) BY MOUTH EVERY 8 HOURS AS NEEDED 90 tablet 0  . zolpidem (AMBIEN) 10 MG tablet TAKE 1 TABLET(10 MG) BY MOUTH AT BEDTIME AS NEEDED FOR SLEEP 30 tablet 0  . [DISCONTINUED]  budesonide-formoterol (SYMBICORT) 160-4.5 MCG/ACT inhaler Inhale 2 puffs into the lungs 2 (two) times daily as needed. Patient has sample, is not using 1 Inhaler 0   No current facility-administered medications on file prior to visit.    LABS/IMAGING: No results found for this or any previous visit (from the past 48 hour(s)). No results found.  LIPID PANEL:    Component Value Date/Time   CHOL 248 (H) 02/16/2020 1124   TRIG 158.0 (H) 02/16/2020 1124   HDL 68.30 02/16/2020 1124   CHOLHDL 4 02/16/2020 1124   VLDL 31.6 02/16/2020 1124   LDLCALC 148 (H) 02/16/2020 1124    WEIGHTS: Wt Readings from Last 3 Encounters:  06/02/20 267 lb 12.8 oz (121.5 kg)  04/01/20 269 lb 9.6 oz (122.3 kg)  02/16/20 (!) 271 lb 12.8 oz (123.3 kg)    VITALS: BP 122/68   Pulse 62   Ht 5\' 2"  (1.575 m)   Wt 267 lb 12.8 oz (121.5 kg)   BMI 48.98 kg/m   EXAM: General appearance: alert, no distress and morbidly obese Neck: no carotid bruit, no JVD and thyroid not enlarged, symmetric, no tenderness/mass/nodules Lungs: clear to auscultation bilaterally Heart: regular rate and rhythm Abdomen: soft, non-tender; bowel sounds normal; no masses,  no organomegaly Extremities: extremities normal, atraumatic, no cyanosis or edema Pulses: 2+ and symmetric Skin: Skin color, texture, turgor normal. No rashes or lesions Neurologic: Grossly normal Psych: Pleasant  EKG: Deferred  ASSESSMENT: 1. Mixed dyslipidemia, goal LDL less than 100 2. Angiographically normal coronaries in 2017 3. Morbid  obesity 4. Hypertension-controlled  PLAN: 1.   Rebecca Brooks had significant myalgias on rosuvastatin.  She previously taken simvastatin however had been switched due to a rise in her cholesterol about 3 months ago.  Prior to that she has had several years of very well controlled cholesterol with target LDL less than 100.  Since she had side effects I would advise going back to her simvastatin 40 mg daily.  Would recommend a repeat lipid by her PCP in about 3 months.  If this is still not at target, could consider adding ezetimibe which I suspect would be better tolerated to get her to target less than 100.  Fortunately she is no significant family history of heart disease and had normal coronaries by cath in 2017 therefore I would consider low risk.  Thanks for the kind referral.  Follow-up with me as needed.  Rebecca Casino, MD, [facility] [place], [place] Director of the Advanced Lipid Disorders &  Cardiovascular Risk Reduction Clinic Diplomate of the American Board of Clinical Lipidology Attending Cardiologist  Direct Dial: [phone]  Fax: [phone]  Website:  [email or web address] Consalvo 06/02/2020, 4:01 PM

## 2020-06-02 NOTE — Patient Instructions (Signed)
Medication Instructions:  RESUME simvastatin 40mg  daily  *If you need a refill on your cardiac medications before your next appointment, please call your pharmacy*   Follow-Up: At [facility], you and your health needs are our priority.  As part of our continuing mission to provide you with exceptional heart care, we have created designated Provider Care Teams.  These Care Teams include your primary Cardiologist (physician) and Advanced Practice Providers (APPs -  Physician Assistants and Nurse Practitioners) who all work together to provide you with the care you need, when you need it.  We recommend signing up for the patient portal called "MyChart".  Sign up information is provided on this After Visit Summary.  MyChart is used to connect with patients for Virtual Visits (Telemedicine).  Patients are able to view lab/test results, encounter notes, upcoming appointments, etc.  Non-urgent messages can be sent to your provider as well.   To learn more about what you can do with MyChart, go to [email or web address].    Your next appointment:   AS NEEDED with Dr. Debara Pickett  Goal LDL less than 100

## 2020-06-08 ENCOUNTER — Encounter: Payer: Self-pay | Admitting: Family Medicine

## 2020-06-25 ENCOUNTER — Ambulatory Visit: Payer: Medicare Other | Admitting: Internal Medicine

## 2020-07-02 ENCOUNTER — Encounter: Payer: Self-pay | Admitting: Family Medicine

## 2020-07-02 DIAGNOSIS — H25812 Combined forms of age-related cataract, left eye: Secondary | ICD-10-CM | POA: Diagnosis not present

## 2020-07-14 ENCOUNTER — Other Ambulatory Visit: Payer: Self-pay | Admitting: Family Medicine

## 2020-07-14 ENCOUNTER — Encounter: Payer: Self-pay | Admitting: Family Medicine

## 2020-07-26 ENCOUNTER — Other Ambulatory Visit: Payer: Self-pay | Admitting: Family Medicine

## 2020-08-15 ENCOUNTER — Other Ambulatory Visit: Payer: Self-pay | Admitting: Family Medicine

## 2020-08-18 ENCOUNTER — Other Ambulatory Visit: Payer: Self-pay | Admitting: Family Medicine

## 2020-08-18 NOTE — Telephone Encounter (Signed)
Requesting: tramadol 50mg  Contract: None UDS: 05/14/2018 Last Visit: 02/16/2020 Next Visit: None Last Refill: 05/27/2020 #90 and 0RF Pt sig: 1-2 tabs q8h prn  Please Advise

## 2020-08-19 ENCOUNTER — Ambulatory Visit: Payer: Medicare Other | Admitting: Family Medicine

## 2020-08-24 DIAGNOSIS — H25013 Cortical age-related cataract, bilateral: Secondary | ICD-10-CM | POA: Diagnosis not present

## 2020-08-24 DIAGNOSIS — H25043 Posterior subcapsular polar age-related cataract, bilateral: Secondary | ICD-10-CM | POA: Diagnosis not present

## 2020-08-24 DIAGNOSIS — H2513 Age-related nuclear cataract, bilateral: Secondary | ICD-10-CM | POA: Diagnosis not present

## 2020-08-24 DIAGNOSIS — H18413 Arcus senilis, bilateral: Secondary | ICD-10-CM | POA: Diagnosis not present

## 2020-09-07 DIAGNOSIS — N904 Leukoplakia of vulva: Secondary | ICD-10-CM | POA: Diagnosis not present

## 2020-09-07 DIAGNOSIS — N9089 Other specified noninflammatory disorders of vulva and perineum: Secondary | ICD-10-CM | POA: Diagnosis not present

## 2020-09-08 ENCOUNTER — Encounter (HOSPITAL_COMMUNITY): Payer: Self-pay | Admitting: Emergency Medicine

## 2020-09-08 ENCOUNTER — Emergency Department (HOSPITAL_COMMUNITY)
Admission: EM | Admit: 2020-09-08 | Discharge: 2020-09-09 | Disposition: A | Discharge status: Home / Self Care | Payer: Medicare Other | Attending: Emergency Medicine | Admitting: Emergency Medicine

## 2020-09-08 DIAGNOSIS — S92155A Nondisplaced avulsion fracture (chip fracture) of left talus, initial encounter for closed fracture: Secondary | ICD-10-CM | POA: Diagnosis not present

## 2020-09-08 DIAGNOSIS — Z96641 Presence of right artificial hip joint: Secondary | ICD-10-CM | POA: Diagnosis not present

## 2020-09-08 DIAGNOSIS — I10 Essential (primary) hypertension: Secondary | ICD-10-CM | POA: Insufficient documentation

## 2020-09-08 DIAGNOSIS — Y92410 Unspecified street and highway as the place of occurrence of the external cause: Secondary | ICD-10-CM | POA: Diagnosis not present

## 2020-09-08 DIAGNOSIS — Z8616 Personal history of COVID-19: Secondary | ICD-10-CM | POA: Diagnosis not present

## 2020-09-08 DIAGNOSIS — S1091XA Abrasion of unspecified part of neck, initial encounter: Secondary | ICD-10-CM | POA: Diagnosis not present

## 2020-09-08 DIAGNOSIS — Z7952 Long term (current) use of systemic steroids: Secondary | ICD-10-CM | POA: Diagnosis not present

## 2020-09-08 DIAGNOSIS — E039 Hypothyroidism, unspecified: Secondary | ICD-10-CM | POA: Diagnosis not present

## 2020-09-08 DIAGNOSIS — M25561 Pain in right knee: Secondary | ICD-10-CM | POA: Insufficient documentation

## 2020-09-08 DIAGNOSIS — J45909 Unspecified asthma, uncomplicated: Secondary | ICD-10-CM | POA: Diagnosis not present

## 2020-09-08 DIAGNOSIS — M25512 Pain in left shoulder: Secondary | ICD-10-CM | POA: Insufficient documentation

## 2020-09-08 DIAGNOSIS — I672 Cerebral atherosclerosis: Secondary | ICD-10-CM | POA: Diagnosis not present

## 2020-09-08 DIAGNOSIS — Z79899 Other long term (current) drug therapy: Secondary | ICD-10-CM | POA: Insufficient documentation

## 2020-09-08 DIAGNOSIS — Z7982 Long term (current) use of aspirin: Secondary | ICD-10-CM | POA: Diagnosis not present

## 2020-09-08 DIAGNOSIS — M25572 Pain in left ankle and joints of left foot: Secondary | ICD-10-CM | POA: Diagnosis not present

## 2020-09-08 DIAGNOSIS — M542 Cervicalgia: Secondary | ICD-10-CM | POA: Insufficient documentation

## 2020-09-08 DIAGNOSIS — M1711 Unilateral primary osteoarthritis, right knee: Secondary | ICD-10-CM | POA: Diagnosis not present

## 2020-09-08 DIAGNOSIS — R600 Localized edema: Secondary | ICD-10-CM | POA: Diagnosis not present

## 2020-09-08 DIAGNOSIS — Z5321 Procedure and treatment not carried out due to patient leaving prior to being seen by health care provider: Secondary | ICD-10-CM | POA: Insufficient documentation

## 2020-09-08 DIAGNOSIS — M25461 Effusion, right knee: Secondary | ICD-10-CM | POA: Diagnosis not present
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
## 2020-09-08 NOTE — ED Notes (Signed)
Pt stated they wanted to leave and they were advised to stay and the pt left anyway.

## 2020-09-09 ENCOUNTER — Emergency Department (HOSPITAL_BASED_OUTPATIENT_CLINIC_OR_DEPARTMENT_OTHER): Payer: Medicare Other

## 2020-09-09 ENCOUNTER — Encounter (HOSPITAL_BASED_OUTPATIENT_CLINIC_OR_DEPARTMENT_OTHER): Payer: Self-pay | Admitting: *Deleted

## 2020-09-09 ENCOUNTER — Emergency Department (HOSPITAL_BASED_OUTPATIENT_CLINIC_OR_DEPARTMENT_OTHER)
Admission: EM | Admit: 2020-09-09 | Discharge: 2020-09-09 | Disposition: A | Discharge status: Home / Self Care | Payer: Medicare Other | Source: Home / Self Care | Attending: Emergency Medicine | Admitting: Emergency Medicine

## 2020-09-09 ENCOUNTER — Other Ambulatory Visit: Payer: Self-pay

## 2020-09-09 DIAGNOSIS — Y92410 Unspecified street and highway as the place of occurrence of the external cause: Secondary | ICD-10-CM | POA: Insufficient documentation

## 2020-09-09 DIAGNOSIS — I10 Essential (primary) hypertension: Secondary | ICD-10-CM | POA: Insufficient documentation

## 2020-09-09 DIAGNOSIS — J45909 Unspecified asthma, uncomplicated: Secondary | ICD-10-CM | POA: Insufficient documentation

## 2020-09-09 DIAGNOSIS — E039 Hypothyroidism, unspecified: Secondary | ICD-10-CM | POA: Insufficient documentation

## 2020-09-09 DIAGNOSIS — Z96641 Presence of right artificial hip joint: Secondary | ICD-10-CM | POA: Insufficient documentation

## 2020-09-09 DIAGNOSIS — M542 Cervicalgia: Secondary | ICD-10-CM | POA: Diagnosis not present

## 2020-09-09 DIAGNOSIS — Z7952 Long term (current) use of systemic steroids: Secondary | ICD-10-CM | POA: Insufficient documentation

## 2020-09-09 DIAGNOSIS — S1091XA Abrasion of unspecified part of neck, initial encounter: Secondary | ICD-10-CM | POA: Insufficient documentation

## 2020-09-09 DIAGNOSIS — Z79899 Other long term (current) drug therapy: Secondary | ICD-10-CM | POA: Insufficient documentation

## 2020-09-09 DIAGNOSIS — M25572 Pain in left ankle and joints of left foot: Secondary | ICD-10-CM | POA: Diagnosis not present

## 2020-09-09 DIAGNOSIS — M25561 Pain in right knee: Secondary | ICD-10-CM | POA: Insufficient documentation

## 2020-09-09 DIAGNOSIS — Z8616 Personal history of COVID-19: Secondary | ICD-10-CM | POA: Insufficient documentation

## 2020-09-09 DIAGNOSIS — Z87891 Personal history of nicotine dependence: Secondary | ICD-10-CM | POA: Insufficient documentation

## 2020-09-09 DIAGNOSIS — Z7982 Long term (current) use of aspirin: Secondary | ICD-10-CM | POA: Insufficient documentation

## 2020-09-09 DIAGNOSIS — R600 Localized edema: Secondary | ICD-10-CM | POA: Diagnosis not present

## 2020-09-09 DIAGNOSIS — M1711 Unilateral primary osteoarthritis, right knee: Secondary | ICD-10-CM | POA: Diagnosis not present

## 2020-09-09 DIAGNOSIS — S92155A Nondisplaced avulsion fracture (chip fracture) of left talus, initial encounter for closed fracture: Secondary | ICD-10-CM | POA: Insufficient documentation

## 2020-09-09 DIAGNOSIS — R52 Pain, unspecified: Secondary | ICD-10-CM

## 2020-09-09 DIAGNOSIS — M25461 Effusion, right knee: Secondary | ICD-10-CM | POA: Diagnosis not present

## 2020-09-09 DIAGNOSIS — I672 Cerebral atherosclerosis: Secondary | ICD-10-CM | POA: Diagnosis not present

## 2020-09-09 LAB — BASIC METABOLIC PANEL
Anion gap: 10 (ref 5–15)
BUN: 22 mg/dL (ref 8–23)
CO2: 27 mmol/L (ref 22–32)
Calcium: 9.2 mg/dL (ref 8.9–10.3)
Chloride: 101 mmol/L (ref 98–111)
Creatinine, Ser: 0.71 mg/dL (ref 0.44–1.00)
GFR, Estimated: >60 mL/min (ref >60)
Glucose, Bld: 92 mg/dL (ref 70–99)
Potassium: 3.2 mmol/L — ABNORMAL LOW (ref 3.5–5.1)
Sodium: 138 mmol/L (ref 135–145)

## 2020-09-09 MED ORDER — CYCLOBENZAPRINE HCL 5 MG PO TABS: 5.0000 mg | ORAL_TABLET | Freq: Two times a day (BID) | ORAL | 0 refills | Status: DC | PRN

## 2020-09-09 MED ORDER — CYCLOBENZAPRINE HCL 5 MG PO TABS
5.0000 mg | ORAL_TABLET | Freq: Once | ORAL | Status: AC
  Administered 2020-09-09: 5 mg via ORAL
  Filled 2020-09-09: qty 1

## 2020-09-09 MED ORDER — IOHEXOL 350 MG/ML SOLN
75.0000 mL | Freq: Once | INTRAVENOUS | Status: AC | PRN
  Administered 2020-09-09: 75 mL via INTRAVENOUS

## 2020-09-09 MED ORDER — NAPROXEN 250 MG PO TABS
500.0000 mg | ORAL_TABLET | Freq: Once | ORAL | Status: AC
  Administered 2020-09-09: 500 mg via ORAL
  Filled 2020-09-09: qty 2
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
## 2020-09-10 DIAGNOSIS — H2512 Age-related nuclear cataract, left eye: Secondary | ICD-10-CM | POA: Diagnosis not present

## 2020-09-10 DIAGNOSIS — H2511 Age-related nuclear cataract, right eye: Secondary | ICD-10-CM | POA: Diagnosis not present

## 2020-09-14 DIAGNOSIS — M25511 Pain in right shoulder: Secondary | ICD-10-CM | POA: Diagnosis not present

## 2020-09-16 ENCOUNTER — Other Ambulatory Visit: Payer: Self-pay | Admitting: Family Medicine

## 2020-09-23 ENCOUNTER — Other Ambulatory Visit: Payer: Self-pay | Admitting: Family Medicine

## 2020-09-25 ENCOUNTER — Other Ambulatory Visit: Payer: Self-pay | Admitting: Family Medicine

## 2020-09-30 ENCOUNTER — Ambulatory Visit: Payer: Medicare Other | Admitting: Rheumatology
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
## 2020-10-08 DIAGNOSIS — H524 Presbyopia: Secondary | ICD-10-CM | POA: Diagnosis not present

## 2020-10-12 DIAGNOSIS — M545 Low back pain, unspecified: Secondary | ICD-10-CM | POA: Diagnosis not present

## 2020-10-12 DIAGNOSIS — M25551 Pain in right hip: Secondary | ICD-10-CM | POA: Diagnosis not present

## 2020-10-12 DIAGNOSIS — M25572 Pain in left ankle and joints of left foot: Secondary | ICD-10-CM | POA: Diagnosis not present

## 2020-10-15 ENCOUNTER — Ambulatory Visit: Payer: Medicare Other | Admitting: Rheumatology

## 2020-10-15 DIAGNOSIS — M170 Bilateral primary osteoarthritis of knee: Secondary | ICD-10-CM

## 2020-10-15 DIAGNOSIS — M19011 Primary osteoarthritis, right shoulder: Secondary | ICD-10-CM

## 2020-10-15 DIAGNOSIS — M5136 Other intervertebral disc degeneration, lumbar region: Secondary | ICD-10-CM

## 2020-10-15 DIAGNOSIS — M8589 Other specified disorders of bone density and structure, multiple sites: Secondary | ICD-10-CM

## 2020-10-15 DIAGNOSIS — Z8679 Personal history of other diseases of the circulatory system: Secondary | ICD-10-CM

## 2020-10-15 DIAGNOSIS — Z96641 Presence of right artificial hip joint: Secondary | ICD-10-CM

## 2020-10-15 DIAGNOSIS — Z862 Personal history of diseases of the blood and blood-forming organs and certain disorders involving the immune mechanism: Secondary | ICD-10-CM

## 2020-10-15 DIAGNOSIS — M5030 Other cervical disc degeneration, unspecified cervical region: Secondary | ICD-10-CM

## 2020-10-15 DIAGNOSIS — Z8639 Personal history of other endocrine, nutritional and metabolic disease: Secondary | ICD-10-CM

## 2020-10-15 DIAGNOSIS — R609 Edema, unspecified: Secondary | ICD-10-CM

## 2020-10-15 DIAGNOSIS — M19042 Primary osteoarthritis, left hand: Secondary | ICD-10-CM

## 2020-10-15 DIAGNOSIS — Z6841 Body Mass Index (BMI) 40.0 and over, adult: Secondary | ICD-10-CM

## 2020-10-15 DIAGNOSIS — F5101 Primary insomnia: Secondary | ICD-10-CM

## 2020-10-19 DIAGNOSIS — M6281 Muscle weakness (generalized): Secondary | ICD-10-CM | POA: Diagnosis not present

## 2020-10-19 DIAGNOSIS — S93492D Sprain of other ligament of left ankle, subsequent encounter: Secondary | ICD-10-CM | POA: Diagnosis not present

## 2020-10-19 DIAGNOSIS — R262 Difficulty in walking, not elsewhere classified: Secondary | ICD-10-CM | POA: Diagnosis not present

## 2020-10-22 ENCOUNTER — Other Ambulatory Visit: Payer: Self-pay | Admitting: Family Medicine

## 2020-10-29 ENCOUNTER — Other Ambulatory Visit: Payer: Self-pay | Admitting: Family Medicine

## 2020-10-29 NOTE — Telephone Encounter (Signed)
Requesting: tramadol Contract: none UDS: 05/17/18 Last Visit: 02/16/20 Next Visit: nothing scheduled Last Refill: 08/18/20  Please Advise

## 2020-11-09 ENCOUNTER — Other Ambulatory Visit: Payer: Self-pay | Admitting: Orthopaedic Surgery

## 2020-11-09 DIAGNOSIS — M545 Low back pain, unspecified: Secondary | ICD-10-CM

## 2020-11-09 DIAGNOSIS — S93492D Sprain of other ligament of left ankle, subsequent encounter: Secondary | ICD-10-CM | POA: Diagnosis not present

## 2020-11-10 DIAGNOSIS — M6281 Muscle weakness (generalized): Secondary | ICD-10-CM | POA: Diagnosis not present

## 2020-11-10 DIAGNOSIS — R262 Difficulty in walking, not elsewhere classified: Secondary | ICD-10-CM | POA: Diagnosis not present

## 2020-11-10 DIAGNOSIS — S93492D Sprain of other ligament of left ankle, subsequent encounter: Secondary | ICD-10-CM | POA: Diagnosis not present

## 2020-11-16 DIAGNOSIS — S93492D Sprain of other ligament of left ankle, subsequent encounter: Secondary | ICD-10-CM | POA: Diagnosis not present

## 2020-11-16 DIAGNOSIS — M6281 Muscle weakness (generalized): Secondary | ICD-10-CM | POA: Diagnosis not present

## 2020-11-16 DIAGNOSIS — R262 Difficulty in walking, not elsewhere classified: Secondary | ICD-10-CM | POA: Diagnosis not present

## 2020-11-18 DIAGNOSIS — M6281 Muscle weakness (generalized): Secondary | ICD-10-CM | POA: Diagnosis not present

## 2020-11-18 DIAGNOSIS — S93492D Sprain of other ligament of left ankle, subsequent encounter: Secondary | ICD-10-CM | POA: Diagnosis not present

## 2020-11-18 DIAGNOSIS — R262 Difficulty in walking, not elsewhere classified: Secondary | ICD-10-CM | POA: Diagnosis not present

## 2020-11-20 ENCOUNTER — Other Ambulatory Visit: Payer: Self-pay | Admitting: Family Medicine

## 2020-11-22 ENCOUNTER — Other Ambulatory Visit: Payer: Self-pay | Admitting: Family Medicine

## 2020-11-24 ENCOUNTER — Other Ambulatory Visit: Payer: Medicare Other

## 2020-11-25 ENCOUNTER — Other Ambulatory Visit: Payer: Self-pay

## 2020-11-25 ENCOUNTER — Ambulatory Visit (HOSPITAL_COMMUNITY)
Admission: RE | Admit: 2020-11-25 | Discharge: 2020-11-25 | Disposition: A | Discharge status: Home / Self Care | Payer: Medicare Other | Source: Ambulatory Visit | Attending: Orthopaedic Surgery | Admitting: Orthopaedic Surgery

## 2020-11-25 DIAGNOSIS — M545 Low back pain, unspecified: Secondary | ICD-10-CM | POA: Insufficient documentation

## 2020-12-01 DIAGNOSIS — M545 Low back pain, unspecified: Secondary | ICD-10-CM | POA: Diagnosis not present

## 2020-12-02 DIAGNOSIS — R262 Difficulty in walking, not elsewhere classified: Secondary | ICD-10-CM | POA: Diagnosis not present

## 2020-12-02 DIAGNOSIS — S93492D Sprain of other ligament of left ankle, subsequent encounter: Secondary | ICD-10-CM | POA: Diagnosis not present

## 2020-12-02 DIAGNOSIS — M6281 Muscle weakness (generalized): Secondary | ICD-10-CM | POA: Diagnosis not present

## 2020-12-02 DIAGNOSIS — Z96641 Presence of right artificial hip joint: Secondary | ICD-10-CM | POA: Diagnosis not present

## 2020-12-07 DIAGNOSIS — M47816 Spondylosis without myelopathy or radiculopathy, lumbar region: Secondary | ICD-10-CM | POA: Diagnosis not present

## 2020-12-13 ENCOUNTER — Other Ambulatory Visit: Payer: Self-pay | Admitting: Family Medicine

## 2020-12-13 NOTE — Telephone Encounter (Signed)
Requesting: tramadol Contract:  UDS: 05/14/18 Last Visit: 02/16/20 Next Visit: 03/01/21 Last Refill: 10/29/20  Please Advise

## 2020-12-13 NOTE — Telephone Encounter (Signed)
Patient is leaving in about an hour. Need refills asap
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
## 2020-12-18 ENCOUNTER — Other Ambulatory Visit: Payer: Self-pay | Admitting: Family Medicine

## 2020-12-21 ENCOUNTER — Other Ambulatory Visit: Payer: Self-pay | Admitting: Family Medicine

## 2020-12-25 ENCOUNTER — Emergency Department
Admission: EM | Admit: 2020-12-25 | Discharge: 2020-12-25 | Disposition: A | Discharge status: Home / Self Care | Payer: Medicare Other | Source: Home / Self Care | Attending: Family Medicine | Admitting: Family Medicine

## 2020-12-25 ENCOUNTER — Other Ambulatory Visit: Payer: Self-pay

## 2020-12-25 ENCOUNTER — Emergency Department: Admit: 2020-12-25 | Discharge status: Against Medical Advice | Payer: Self-pay

## 2020-12-25 DIAGNOSIS — R059 Cough, unspecified: Secondary | ICD-10-CM

## 2020-12-25 DIAGNOSIS — R062 Wheezing: Secondary | ICD-10-CM

## 2020-12-25 HISTORY — DX: Other fracture of unspecified lower leg, initial encounter for closed fracture: S82.899A

## 2020-12-25 MED ORDER — PREDNISONE 20 MG PO TABS: 40.0000 mg | ORAL_TABLET | Freq: Every day | ORAL | 0 refills | Status: DC

## 2020-12-25 MED ORDER — AZITHROMYCIN 250 MG PO TABS: 250.0000 mg | ORAL_TABLET | Freq: Every day | ORAL | 0 refills | Status: DC

## 2020-12-25 MED ORDER — ALBUTEROL SULFATE HFA 108 (90 BASE) MCG/ACT IN AERS: 1.0000 | INHALATION_SPRAY | Freq: Four times a day (QID) | RESPIRATORY_TRACT | 1 refills | Status: DC | PRN

## 2020-12-25 NOTE — ED Triage Notes (Signed)
Pt presents to Urgent Care with c/o cough and wheezing x approx one week. Began w/ sore throat and also a "sore tongue." Pt reports a negative home COVID test today; is vaccinated. She reports she had a URI in early May as well.

## 2020-12-27 NOTE — ED Provider Notes (Signed)
[place]   [id number] 12/25/20 Arrival Time: [place]:  1. Cough   2. Wheezing    Given symptoms and duration will treat as below. No resp distress.  Meds ordered this encounter  Medications  . azithromycin (ZITHROMAX) 250 MG tablet    Sig: Take 1 tablet (250 mg total) by mouth daily. Take first 2 tablets together, then 1 every day until finished.    Dispense:  6 tablet    Refill:  0  . predniSONE (DELTASONE) 20 MG tablet    Sig: Take 2 tablets (40 mg total) by mouth daily.    Dispense:  10 tablet    Refill:  0  . albuterol (VENTOLIN HFA) 108 (90 Base) MCG/ACT inhaler    Sig: Inhale 1-2 puffs into the lungs every 6 (six) hours as needed for wheezing or shortness of breath.    Dispense:  1 each    Refill:  1     Follow-up Information    Mosie Lukes, MD.   Specialty: Family Medicine Why: If worsening or failing to improve as anticipated. Contact information: [address] [place] [id number] [phone]               Reviewed expectations re: course of current medical issues. Questions answered. Outlined signs and symptoms indicating need for more acute intervention. Understanding verbalized. After Visit Summary given.   SUBJECTIVE: History from: patient. Rebecca Brooks is a 70 y.o. female who reports cough and wheezing; x 1 week; questions longer. Mild ST from persistent coughing. No specific SOB. Negative home COVID test. OTC meds without relief. Out of albuterol inhaler.  Social History   Tobacco Use  Smoking Status Former Smoker  . Packs/day: 0.50  . Years: 5.00  . Pack years: 2.50  . Types: Cigarettes  . Quit date: 07/25/1971  . Years since quitting: 49.4  Smokeless Tobacco Never Used    OBJECTIVE:  Vitals:   12/25/20 1530 12/25/20 1531  BP: 118/84   Pulse: 71   Resp: 20   Temp: 98.3 F (36.8 C)   TempSrc: Oral   SpO2: 98%   Weight:  112 kg  Height:  5\' 2"  (1.575 m)    General  appearance: alert; no distress Eyes: PERRLA; EOMI; conjunctiva normal HENT: [place]; AT; with mild nasal congestion Neck: supple  Lungs: speaks full sentences without difficulty; unlabored; active dry cough; mild bilateral exp wheezing Extremities: no edema Skin: warm and dry Neurologic: normal gait Psychological: alert and cooperative; normal mood and affect    Allergies  Allergen Reactions  . Bee Venom Anaphylaxis  . Accupril [Quinapril Hcl] Cough  . Quinapril Cough  . Quinapril-Hydrochlorothiazide Cough  . Rosuvastatin Other (See Comments)    Past Medical History:  Diagnosis Date  . Abnormal nuclear stress test 08/23/2015  . Ankle fracture   . Arthritis   . Chicken pox as a child  . Essential hypertension 07/17/2011   ACEI d/c 06/2011 for refractory cough> resolved   . GERD (gastroesophageal reflux disease)   . Hyperlipidemia   . Hypertension   . Hypothyroidism   . Insomnia 11/01/2014  . Low back pain radiating to right leg    intermittent and occasional numbness of skin over right hip in certain positions  . Measles as a child  . Mumps as a child  . Obesity   . Pneumonia    this October from which she has said inhailer  . Recurrent epistaxis  02/20/2012  . Rheumatoid arthritis ([place]) 09/10/2011   Bone on bone in knees Pain in L>R knees.   . RLS (restless legs syndrome) 11/01/2014   Social History   Socioeconomic History  . Marital status: Married    Spouse name: Not on file  . Number of children: 3  . Years of education: Not on file  . Highest education level: Not on file  Occupational History  . Occupation: Training and development officer  . Occupation: theologian  Tobacco Use  . Smoking status: Former Smoker    Packs/day: 0.50    Years: 5.00    Pack years: 2.50    Types: Cigarettes    Quit date: 07/25/1971    Years since quitting: 49.4  . Smokeless tobacco: Never Used  Vaping Use  . Vaping Use: Never used  Substance and Sexual Activity  . Alcohol use: Yes    Comment: OCC  . Drug  use: No  . 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 Not on file  Social History Narrative  . Not on file   Social Determinants of Health   Financial Resource Strain: Not on file  Food Insecurity: Not on file  Transportation Needs: Not on file  Physical Activity: Not on file  Stress: Not on file  Social Connections: Not on file  Intimate Partner Violence: Not on file   Family History  Problem Relation Age of Onset  . Lung cancer Mother 60       smoker  . Heart Problems Mother        tachycardia  . Hearing loss Mother   . Anxiety disorder Mother        anxiety, claustrophobia  . Osteoarthritis Father   . Hyperlipidemia Father   . Anuerysm Father        AAA  . Hyperlipidemia Sister   . Migraines Sister   . Hypertension Sister   . Colon cancer Maternal Grandmother   . Heart attack Maternal Grandfather   . Aneurysm Paternal Grandfather        abdominal  . Heart disease Paternal Grandfather        AAA rupture, smoker  . Heart Problems Sister        tachycardia   Past Surgical History:  Procedure Laterality Date  . CARDIAC CATHETERIZATION N/A 08/23/2015   Procedure: Left Heart Cath and Coronary Angiography;  Surgeon: [name], MD;  Location: [facility];  Service: Cardiovascular;  Laterality: N/A;  . CESAREAN SECTION     X 3  . GASTRECTOMY  12/2017  . INGUINAL HERNIA REPAIR Right 70 yrs old  . THYROIDECTOMY     total for benign tumor, Parathyroid spared  . TONSILLECTOMY    . TOTAL HIP ARTHROPLASTY Right 2006   secondary to congenital  hip defect     Vanessa Kick, MD 12/27/20 [phone]

## 2020-12-29 ENCOUNTER — Encounter: Payer: Self-pay | Admitting: Rheumatology

## 2020-12-29 ENCOUNTER — Other Ambulatory Visit: Payer: Self-pay

## 2020-12-29 ENCOUNTER — Ambulatory Visit: Payer: Medicare Other | Admitting: Rheumatology

## 2020-12-29 VITALS — BP 136/74 | HR 70 | Resp 17 | Ht 62.0 in | Wt 253.0 lb

## 2020-12-29 DIAGNOSIS — Z8639 Personal history of other endocrine, nutritional and metabolic disease: Secondary | ICD-10-CM

## 2020-12-29 DIAGNOSIS — M1712 Unilateral primary osteoarthritis, left knee: Secondary | ICD-10-CM

## 2020-12-29 DIAGNOSIS — M19041 Primary osteoarthritis, right hand: Secondary | ICD-10-CM

## 2020-12-29 DIAGNOSIS — M8589 Other specified disorders of bone density and structure, multiple sites: Secondary | ICD-10-CM

## 2020-12-29 DIAGNOSIS — S92125S Nondisplaced fracture of body of left talus, sequela: Secondary | ICD-10-CM | POA: Diagnosis not present

## 2020-12-29 DIAGNOSIS — F5101 Primary insomnia: Secondary | ICD-10-CM

## 2020-12-29 DIAGNOSIS — Z862 Personal history of diseases of the blood and blood-forming organs and certain disorders involving the immune mechanism: Secondary | ICD-10-CM

## 2020-12-29 DIAGNOSIS — J449 Chronic obstructive pulmonary disease, unspecified: Secondary | ICD-10-CM

## 2020-12-29 DIAGNOSIS — M25562 Pain in left knee: Secondary | ICD-10-CM

## 2020-12-29 DIAGNOSIS — Z6841 Body Mass Index (BMI) 40.0 and over, adult: Secondary | ICD-10-CM

## 2020-12-29 DIAGNOSIS — M170 Bilateral primary osteoarthritis of knee: Secondary | ICD-10-CM

## 2020-12-29 DIAGNOSIS — M47816 Spondylosis without myelopathy or radiculopathy, lumbar region: Secondary | ICD-10-CM | POA: Diagnosis not present

## 2020-12-29 DIAGNOSIS — M19011 Primary osteoarthritis, right shoulder: Secondary | ICD-10-CM | POA: Diagnosis not present

## 2020-12-29 DIAGNOSIS — G8929 Other chronic pain: Secondary | ICD-10-CM

## 2020-12-29 DIAGNOSIS — M5136 Other intervertebral disc degeneration, lumbar region: Secondary | ICD-10-CM

## 2020-12-29 DIAGNOSIS — M5030 Other cervical disc degeneration, unspecified cervical region: Secondary | ICD-10-CM

## 2020-12-29 DIAGNOSIS — M19042 Primary osteoarthritis, left hand: Secondary | ICD-10-CM

## 2020-12-29 DIAGNOSIS — R609 Edema, unspecified: Secondary | ICD-10-CM

## 2020-12-29 DIAGNOSIS — Z8679 Personal history of other diseases of the circulatory system: Secondary | ICD-10-CM

## 2020-12-29 DIAGNOSIS — M19012 Primary osteoarthritis, left shoulder: Secondary | ICD-10-CM

## 2020-12-29 DIAGNOSIS — Z96641 Presence of right artificial hip joint: Secondary | ICD-10-CM | POA: Diagnosis not present

## 2020-12-29 MED ORDER — TRIAMCINOLONE ACETONIDE 40 MG/ML IJ SUSP
40.0000 mg | INTRAMUSCULAR | Status: AC | PRN
  Administered 2020-12-29: 40 mg via INTRA_ARTICULAR

## 2020-12-29 MED ORDER — LIDOCAINE HCL 1 % IJ SOLN
1.5000 mL | INTRAMUSCULAR | Status: AC | PRN
  Administered 2020-12-29: 1.5 mL

## 2020-12-29 NOTE — Patient Instructions (Signed)
Journal for Nurse Practitioners, 15(4), 263-267. Retrieved April 29, 2018 from http://clinicalkey.com/nursing">  Knee Exercises Ask your health care provider which exercises are safe for you. Do exercises exactly as told by your health care provider and adjust them as directed. It is normal to feel mild stretching, pulling, tightness, or discomfort as you do these exercises. Stop right away if you feel sudden pain or your pain gets worse. Do not begin these exercises until told by your health care provider. Stretching and range-of-motion exercises These exercises warm up your muscles and joints and improve the movement and flexibility of your knee. These exercises also help to relieve pain and swelling. Knee extension, prone 1. Lie on your abdomen (prone position) on a bed. 2. Place your left / right knee just beyond the edge of the surface so your knee is not on the bed. You can put a towel under your left / right thigh just above your kneecap for comfort. 3. Relax your leg muscles and allow gravity to straighten your knee (extension). You should feel a stretch behind your left / right knee. 4. Hold this position for __________ seconds. 5. Scoot up so your knee is supported between repetitions. Repeat __________ times. Complete this exercise __________ times a day. Knee flexion, active 1. Lie on your back with both legs straight. If this causes back discomfort, bend your left / right knee so your foot is flat on the floor. 2. Slowly slide your left / right heel back toward your buttocks. Stop when you feel a gentle stretch in the front of your knee or thigh (flexion). 3. Hold this position for __________ seconds. 4. Slowly slide your left / right heel back to the starting position. Repeat __________ times. Complete this exercise __________ times a day.   Quadriceps stretch, prone 1. Lie on your abdomen on a firm surface, such as a bed or padded floor. 2. Bend your left / right knee and hold  your ankle. If you cannot reach your ankle or pant leg, loop a belt around your foot and grab the belt instead. 3. Gently pull your heel toward your buttocks. Your knee should not slide out to the side. You should feel a stretch in the front of your thigh and knee (quadriceps). 4. Hold this position for __________ seconds. Repeat __________ times. Complete this exercise __________ times a day.   Hamstring, supine 1. Lie on your back (supine position). 2. Loop a belt or towel over the ball of your left / right foot. The ball of your foot is on the walking surface, right under your toes. 3. Straighten your left / right knee and slowly pull on the belt to raise your leg until you feel a gentle stretch behind your knee (hamstring). ? Do not let your knee bend while you do this. ? Keep your other leg flat on the floor. 4. Hold this position for __________ seconds. Repeat __________ times. Complete this exercise __________ times a day. Strengthening exercises These exercises build strength and endurance in your knee. Endurance is the ability to use your muscles for a long time, even after they get tired. Quadriceps, isometric This exercise stretches the muscles in front of your thigh (quadriceps) without moving your knee joint (isometric). 1. Lie on your back with your left / right leg extended and your other knee bent. Put a rolled towel or small pillow under your knee if told by your health care provider. 2. Slowly tense the muscles in the front of your   left / right thigh. You should see your kneecap slide up toward your hip or see increased dimpling just above the knee. This motion will push the back of the knee toward the floor. 3. For __________ seconds, hold the muscle as tight as you can without increasing your pain. 4. Relax the muscles slowly and completely. Repeat __________ times. Complete this exercise __________ times a day.   Straight leg raises This exercise stretches the muscles in  front of your thigh (quadriceps) and the muscles that move your hips (hip flexors). 1. Lie on your back with your left / right leg extended and your other knee bent. 2. Tense the muscles in the front of your left / right thigh. You should see your kneecap slide up or see increased dimpling just above the knee. Your thigh may even shake a bit. 3. Keep these muscles tight as you raise your leg 4-6 inches (10-15 cm) off the floor. Do not let your knee bend. 4. Hold this position for __________ seconds. 5. Keep these muscles tense as you lower your leg. 6. Relax your muscles slowly and completely after each repetition. Repeat __________ times. Complete this exercise __________ times a day. Hamstring, isometric 1. Lie on your back on a firm surface. 2. Bend your left / right knee about __________ degrees. 3. Dig your left / right heel into the surface as if you are trying to pull it toward your buttocks. Tighten the muscles in the back of your thighs (hamstring) to "dig" as hard as you can without increasing any pain. 4. Hold this position for __________ seconds. 5. Release the tension gradually and allow your muscles to relax completely for __________ seconds after each repetition. Repeat __________ times. Complete this exercise __________ times a day. Hamstring curls If told by your health care provider, do this exercise while wearing ankle weights. Begin with __________ lb weights. Then increase the weight by 1 lb (0.5 kg) increments. Do not wear ankle weights that are more than __________ lb. 1. Lie on your abdomen with your legs straight. 2. Bend your left / right knee as far as you can without feeling pain. Keep your hips flat against the floor. 3. Hold this position for __________ seconds. 4. Slowly lower your leg to the starting position. Repeat __________ times. Complete this exercise __________ times a day.   Squats This exercise strengthens the muscles in front of your thigh and knee  (quadriceps). 1. Stand in front of a table, with your feet and knees pointing straight ahead. You may rest your hands on the table for balance but not for support. 2. Slowly bend your knees and lower your hips like you are going to sit in a chair. ? Keep your weight over your heels, not over your toes. ? Keep your lower legs upright so they are parallel with the table legs. ? Do not let your hips go lower than your knees. ? Do not bend lower than told by your health care provider. ? If your knee pain increases, do not bend as low. 3. Hold the squat position for __________ seconds. 4. Slowly push with your legs to return to standing. Do not use your hands to pull yourself to standing. Repeat __________ times. Complete this exercise __________ times a day. Wall slides This exercise strengthens the muscles in front of your thigh and knee (quadriceps). 1. Lean your back against a smooth wall or door, and walk your feet out 18-24 inches (46-61 cm) from it. 2.   Place your feet hip-width apart. 3. Slowly slide down the wall or door until your knees bend __________ degrees. Keep your knees over your heels, not over your toes. Keep your knees in line with your hips. 4. Hold this position for __________ seconds. Repeat __________ times. Complete this exercise __________ times a day.   Straight leg raises This exercise strengthens the muscles that rotate the leg at the hip and move it away from your body (hip abductors). 1. Lie on your side with your left / right leg in the top position. Lie so your head, shoulder, knee, and hip line up. You may bend your bottom knee to help you keep your balance. 2. Roll your hips slightly forward so your hips are stacked directly over each other and your left / right knee is facing forward. 3. Leading with your heel, lift your top leg 4-6 inches (10-15 cm). You should feel the muscles in your outer hip lifting. ? Do not let your foot drift forward. ? Do not let your  knee roll toward the ceiling. 4. Hold this position for __________ seconds. 5. Slowly return your leg to the starting position. 6. Let your muscles relax completely after each repetition. Repeat __________ times. Complete this exercise __________ times a day.   Straight leg raises This exercise stretches the muscles that move your hips away from the front of the pelvis (hip extensors). 1. Lie on your abdomen on a firm surface. You can put a pillow under your hips if that is more comfortable. 2. Tense the muscles in your buttocks and lift your left / right leg about 4-6 inches (10-15 cm). Keep your knee straight as you lift your leg. 3. Hold this position for __________ seconds. 4. Slowly lower your leg to the starting position. 5. Let your leg relax completely after each repetition. Repeat __________ times. Complete this exercise __________ times a day. This information is not intended to replace advice given to you by your health care provider. Make sure you discuss any questions you have with your health care provider. Document Revised: 04/30/2018 Document Reviewed: 04/30/2018 Elsevier Patient Education  2021 [employer]  

## 2020-12-30 ENCOUNTER — Other Ambulatory Visit: Payer: Self-pay | Admitting: Family Medicine

## 2021-01-04 ENCOUNTER — Ambulatory Visit (INDEPENDENT_AMBULATORY_CARE_PROVIDER_SITE_OTHER): Payer: Medicare Other | Admitting: Medical

## 2021-01-04 ENCOUNTER — Telehealth: Payer: Self-pay | Admitting: *Deleted

## 2021-01-04 ENCOUNTER — Other Ambulatory Visit: Payer: Self-pay

## 2021-01-04 ENCOUNTER — Encounter: Payer: Self-pay | Admitting: Medical

## 2021-01-04 ENCOUNTER — Ambulatory Visit (HOSPITAL_BASED_OUTPATIENT_CLINIC_OR_DEPARTMENT_OTHER)
Admission: RE | Admit: 2021-01-04 | Discharge: 2021-01-04 | Disposition: A | Discharge status: Home / Self Care | Payer: Medicare Other | Source: Ambulatory Visit | Attending: Medical | Admitting: Medical

## 2021-01-04 ENCOUNTER — Telehealth: Payer: Self-pay

## 2021-01-04 VITALS — BP 140/80 | HR 61 | Temp 98.0°F | Resp 18 | Ht 62.0 in | Wt 253.6 lb

## 2021-01-04 DIAGNOSIS — R059 Cough, unspecified: Secondary | ICD-10-CM | POA: Diagnosis not present

## 2021-01-04 DIAGNOSIS — J40 Bronchitis, not specified as acute or chronic: Secondary | ICD-10-CM | POA: Insufficient documentation

## 2021-01-04 DIAGNOSIS — R062 Wheezing: Secondary | ICD-10-CM | POA: Diagnosis not present

## 2021-01-04 NOTE — Telephone Encounter (Signed)
Who Is Calling Patient / Member / Family / Caregiver Call Type Triage / Clinical Relationship To Patient Self Return Phone Number [phone] (Primary) Chief Complaint Health information question (non symptomatic) Reason for Call Request to [facility] Appointment Initial Comment Caller has an appointment tomorrow at 3pm and she wants to cancel. She was worried her antibiotic wasn't working but wants to give it a little more time. Translation No Nurse Assessment Nurse: Merry Lofty, RN, Lattie Haw Date/Time (Eastern Time): 01/03/2021 7:16:47 PM Confirm and document reason for call. If symptomatic, describe symptoms. ---Caller is asking if she needs to keep her appointment at 3 pm tomorrow. She got sick with upper respiratory infection in May and it came back 1 week ago. [facility] for another infection with wheezing. She was prescribed prednisone , zpack and refilled inhaler. She completed the zpak 4 days ago. She is still has occasional cough and occasional wheeze. She states her doctor friend told her to wait for appointment because it has not been 10 days.  Comments User: Claudius Sis, RN Date/Time Eilene Ghazi Time): 01/03/2021 7:29:19 PM Caller states she would like to get a new spacer to use with her inhaler tomorrow. User: Claudius Sis, RN Date/Time [place]il[place] Ghazi Time): 01/03/2021 7:30:41 PM She will keep her scheduled appointment for tomorrow

## 2021-01-04 NOTE — Patient Instructions (Signed)
Your former former bronchitis signs/symptoms appear basically resolved almost completely. Will get chest xray to verify no pneumonia as that is concern of yours.  I don't think current further treatment necessary but let me know if ay reflare of symptoms occur in next week.  Asking staff to send over rx spacer to use with inhaler in future if needed.  Follow up as regularly scheduled with pcp or as needed

## 2021-01-04 NOTE — Progress Notes (Signed)
Subjective:    [name], female    DOB: 1951-03-03, 70 y.o.   MRN: [id number]  HPI  In may she got chest congested and cough. She states had symptoms for about 2 weeks. Then got better but then about 10 days ago started with the below.    Week ago Saturday she got sick again with chest congestion, coughing and wheezing. Pt give z-pack inhaler refill and taper prednisone. Pt has some improvement but did resolve. She states some residual fatigue and about 80% better. She want sot make sure does not have pneumonia.     Hx of asthma dx in the past. She clarifies that typically only will wheeze when she has illness.  Pt states a lot better today. She almost canceled the appointment.    Review of Systems  Constitutional:  Negative for chills and fatigue.  HENT:  Positive for congestion. Negative for ear discharge, facial swelling, postnasal drip, sinus pressure and sinus pain.   Respiratory:  Positive for cough.   Cardiovascular:  Negative for chest pain and palpitations.  Musculoskeletal:  Negative for back pain and myalgias.  Skin:  Negative for rash.  Neurological:  Negative for dizziness, seizures, syncope, weakness and headaches.  Hematological:  Negative for adenopathy. Does not bruise/bleed easily.  Psychiatric/Behavioral:  Negative for behavioral problems, confusion and dysphoric mood.      Past Medical History:  Diagnosis Date   Abnormal nuclear stress test 08/23/2015   Ankle fracture    Arthritis    Chicken pox as a child   Essential hypertension 07/17/2011   ACEI d/c 06/2011 for refractory cough> resolved    GERD (gastroesophageal reflux disease)    Hyperlipidemia    Hypertension    Hypothyroidism    Insomnia 11/01/2014   Low back pain radiating to right leg    intermittent and occasional numbness of skin over right hip in certain positions   Measles as a child   Mumps as a child   Obesity    Pneumonia    this October from which she has  said inhailer   Recurrent epistaxis 02/20/2012   Rheumatoid arthritis ([place]) 09/10/2011   Bone on bone in knees Pain in L>R knees.    RLS (restless legs syndrome) 11/01/2014   Spinal stenosis      Social History   Socioeconomic History   Marital status: Married    Spouse name: Not on file   Number of children: 3   Years of education: Not on file   Highest education level: Not on file  Occupational History   Occupation: Training and development officer   Occupation: theologian  Tobacco Use   Smoking status: Former    Packs/day: 0.50    Years: 5.00    Pack years: 2.50    Types: Cigarettes    Quit date: 07/25/1971    Years since quitting: 49.4   Smokeless tobacco: Never  Vaping Use   Vaping Use: Never used  Substance and Sexual Activity   Alcohol use: Yes    Comment: OCC   Drug use: No   Sexual activity: Never    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  Other Topics Concern   Not on file  Social History Narrative   Not on file   Social Determinants of Health   Financial Resource Strain: Not on file  Food Insecurity: Not on file  Transportation Needs: Not on file  Physical Activity: Not on file  Stress: Not on file  Social Connections: Not on file  Intimate Partner Violence: Not on file    Past Surgical History:  Procedure Laterality Date   CARDIAC CATHETERIZATION N/A 08/23/2015   Procedure: Left Heart Cath and Coronary Angiography;  Surgeon: [name], MD;  Location: [facility];  Service: Cardiovascular;  Laterality: N/A;   CESAREAN SECTION     X 3   GASTRECTOMY  12/2017   INGUINAL HERNIA REPAIR Right 70 yrs old   THYROIDECTOMY     total for benign tumor, Parathyroid spared   TONSILLECTOMY     TOTAL HIP ARTHROPLASTY Right 2006   secondary to congenital  hip defect    Family History  Problem Relation Age of Onset   Lung cancer Mother 74       smoker   Heart Problems Mother        tachycardia   Hearing loss Mother    Anxiety disorder Mother         anxiety, claustrophobia   Osteoarthritis Father    Hyperlipidemia Father    Anuerysm Father        AAA   Hyperlipidemia Sister    Migraines Sister    Hypertension Sister    Colon cancer Maternal Grandmother    Heart attack Maternal Grandfather    Aneurysm Paternal Grandfather        abdominal   Heart disease Paternal Grandfather        AAA rupture, smoker   Heart Problems Sister        tachycardia    Allergies  Allergen Reactions   Bee Venom Anaphylaxis   Accupril [Quinapril Hcl] Cough   Quinapril Cough   Quinapril-Hydrochlorothiazide Cough   Rosuvastatin Other (See Comments)    Current Outpatient Medications on File Prior to Visit  Medication Sig Dispense Refill   albuterol (VENTOLIN HFA) 108 (90 Base) MCG/ACT inhaler Inhale 1-2 puffs into the lungs every 6 (six) hours as needed for wheezing or shortness of breath. 1 each 1   aspirin EC 81 MG tablet Take 1 tablet (81 mg total) by mouth daily.     cetirizine (ZYRTEC) 10 MG tablet TAKE 1 TABLET BY MOUTH DAILY 90 tablet 1   Cholecalciferol (VITAMIN D3) 25 MCG (1000 UT) CAPS Take 1 capsule (1,000 Units total) by mouth daily. 90 capsule 3   Cyanocobalamin (VITAMIN B-12 PO) Take by mouth daily.     diclofenac sodium (VOLTAREN) 1 % GEL APPLY 2-4 GRAMS TOPICALLY TO AFFECTED JOINT UP TO 4 TIMES DAILY 400 g 2   famotidine (PEPCID) 20 MG tablet Take 1 tablet (20 mg total) by mouth at bedtime. 90 tablet 3   fluticasone (FLONASE) 50 MCG/ACT nasal spray SHAKE LIQUID AND USE 2 SPRAYS IN EACH NOSTRIL DAILY 48 g 1   furosemide (LASIX) 20 MG tablet TAKE 2 TABLETS BY MOUTH DAILY FOR 5 DAYS THEN DECREASE TO 1 TABLET BY MOUTH DAILY AS NEEDED 180 tablet 0   hydrochlorothiazide (HYDRODIURIL) 25 MG tablet TAKE 1 TABLET BY MOUTH DAILY 90 tablet 1   levothyroxine (SYNTHROID) 100 MCG tablet TAKE 1 TABLET BY MOUTH DAILY BEFORE BREAKFAST. 90 tablet 0   metoprolol tartrate (LOPRESSOR) 50 MG tablet TAKE 1 TABLET(50 MG) BY MOUTH TWICE DAILY 60 tablet 2    nitroGLYCERIN (NITROSTAT) 0.4 MG SL tablet Place 1 tablet (0.4 mg total) under the tongue every 5 (five) minutes as needed for chest pain. 25 tablet 3   pantoprazole (PROTONIX) 40 MG tablet Take 1 tablet (40 mg total) by mouth  daily. 90 tablet 3   potassium chloride SA (K-DUR,KLOR-CON) 20 MEQ tablet TAKE 1 TABLET BY MOUTH DAILY AS NEEDED(DEPENDING ON LASIX USE) 90 tablet 3   rOPINIRole (REQUIP) 0.5 MG tablet TAKE 1 TO 2 TABLETS(0.5 TO 1 MG) BY MOUTH AT BEDTIME AS NEEDED 180 tablet 0   traMADol (ULTRAM) 50 MG tablet TAKE 1 TO 2 TABLETS(50 TO 100 MG) BY MOUTH EVERY 8 HOURS AS NEEDED 90 tablet 0   zolpidem (AMBIEN) 10 MG tablet TAKE 1 TABLET(10 MG) BY MOUTH AT BEDTIME AS NEEDED FOR SLEEP 30 tablet 0   simvastatin (ZOCOR) 40 MG tablet Take 1 tablet (40 mg total) by mouth at bedtime. 90 tablet 3   [DISCONTINUED] budesonide-formoterol (SYMBICORT) 160-4.5 MCG/ACT inhaler Inhale 2 puffs into the lungs 2 (two) times daily as needed. Patient has sample, is not using 1 Inhaler 0   No current facility-administered medications on file prior to visit.    BP 140/80   Pulse 61   Temp 98 F (36.7 C)   Resp 18   Ht 5\' 2"  (1.575 m)   Wt 253 lb 9.6 oz (115 kg)   SpO2 98%   BMI 46.38 kg/m       Objective:   Physical Exam  General  Mental Status - Alert. General Appearance - Well groomed. Not in acute distress.  Skin Rashes- No Rashes.  HEENT Head- Normal. Ear Auditory Canal - Left- Normal. Right - Normal.Tympanic Membrane- Left- Normal. Right- Normal. Eye Sclera/Conjunctiva- Left- Normal. Right- Normal. Nose & Sinuses Nasal Mucosa- Left-  Boggy and Congested. Right-  Boggy and  Congested.Bilateral no maxillary and  no frontal sinus pressure.   Neck Neck- Supple. No Masses.   Chest and Lung Exam Auscultation: Breath Sounds:-Clear even and unlabored.  Cardiovascular Auscultation:Rythm- Regular, rate and rhythm. Murmurs & Other Heart Sounds:Ausculatation of the heart reveal- No  Murmurs.  Lymphatic Head & Neck General Head & Neck Lymphatics: Bilateral: Description- No Localized lymphadenopathy.       Assessment & Plan:  Your former former bronchitis signs/symptoms appear basically resolved almost completely. Will get chest xray to verify no pneumonia as that is concern of yours.  I don't think current further treatment necessary but let me know if ay reflare of symptoms occur in next week.  Asking staff to send over rx spacer to use with inhaler in future if needed.  Follow up as regularly scheduled with pcp or as needed

## 2021-01-04 NOTE — Telephone Encounter (Signed)
Patient Name: Rebecca Brooks Gender: Female DOB: Oct 23, 1950 Age: 70 Y 64 M 5 D Return Phone Number: [phone] Client [facility] - Client Client Site [facility] - Night Physician [place], Percell Miller - [place] Contact Type Call Who Is Calling Patient / Member / Family / Caregiver Call Type Triage / Clinical Relationship To Patient Self Return Phone Number [phone] (Primary) Chief Complaint Health information question (non symptomatic) Reason for Call Request to [facility] [place] Appointment Initial Comment Caller has an appointment tomorrow at 3pm and she wants to cancel. She was worried her antibiotic wasn't working but wants to give it a little more time. Translation No Nurse Assessment Nurse: Merry Lofty, RN, Lattie Haw Date/Time (Eastern Time): 01/03/2021 7:16:47 PM Confirm and document reason for call. If symptomatic, describe symptoms. ---Caller is asking if she needs to keep her appointment at 3 pm tomorrow. She got sick with upper respiratory infection in May and it came back 1 week ago. She was seen at [facility] last [facility] for another infection with wheezing. She was prescribed prednisone , zpack and refilled inhaler. She completed the zpak 4 days ago. She is still has occasional cough and occasional wheeze. She states her doctor friend told her to wait for appointment because it has not been 10 days. Does the patient have any new or worsening symptoms? ---Yes Will a triage be completed? ---Yes Related visit to physician within the last 2 weeks? ---Yes Does the PT have any chronic conditions? (i.e. diabetes, asthma, this includes High risk factors for pregnancy, etc.) ---Yes List chronic conditions. ---arthritis, htn, high cholesterol Is this a behavioral health or substance abuse call? ---No Guidelines Guideline Title Affirmed Question Affirmed Notes Nurse Date/Time (Eastern Time) Cough - Acute NonProductive Cough has been present for > 3  weeks Merry Lofty, RNLattie Haw 01/03/2021 7:23:35 P

## 2021-01-04 NOTE — Telephone Encounter (Signed)
See other phone note

## 2021-01-12 DIAGNOSIS — M6281 Muscle weakness (generalized): Secondary | ICD-10-CM | POA: Diagnosis not present

## 2021-01-12 DIAGNOSIS — R262 Difficulty in walking, not elsewhere classified: Secondary | ICD-10-CM | POA: Diagnosis not present

## 2021-01-12 DIAGNOSIS — S93492D Sprain of other ligament of left ankle, subsequent encounter: Secondary | ICD-10-CM | POA: Diagnosis not present

## 2021-01-12 DIAGNOSIS — Z96641 Presence of right artificial hip joint: Secondary | ICD-10-CM | POA: Diagnosis not present

## 2021-01-26 ENCOUNTER — Encounter (INDEPENDENT_AMBULATORY_CARE_PROVIDER_SITE_OTHER): Payer: Self-pay | Admitting: Family Medicine

## 2021-01-26 ENCOUNTER — Ambulatory Visit (INDEPENDENT_AMBULATORY_CARE_PROVIDER_SITE_OTHER): Payer: Medicare Other | Admitting: Family Medicine

## 2021-01-26 ENCOUNTER — Other Ambulatory Visit: Payer: Self-pay

## 2021-01-26 VITALS — BP 119/78 | HR 67 | Temp 97.5°F | Ht 62.0 in | Wt 251.0 lb

## 2021-01-26 DIAGNOSIS — R5383 Other fatigue: Secondary | ICD-10-CM

## 2021-01-26 DIAGNOSIS — Z0289 Encounter for other administrative examinations: Secondary | ICD-10-CM

## 2021-01-26 DIAGNOSIS — E039 Hypothyroidism, unspecified: Secondary | ICD-10-CM | POA: Diagnosis not present

## 2021-01-26 DIAGNOSIS — Z6841 Body Mass Index (BMI) 40.0 and over, adult: Secondary | ICD-10-CM | POA: Diagnosis not present

## 2021-01-26 DIAGNOSIS — E559 Vitamin D deficiency, unspecified: Secondary | ICD-10-CM | POA: Diagnosis not present

## 2021-01-26 DIAGNOSIS — R739 Hyperglycemia, unspecified: Secondary | ICD-10-CM

## 2021-01-26 DIAGNOSIS — Z1331 Encounter for screening for depression: Secondary | ICD-10-CM

## 2021-01-26 DIAGNOSIS — R0602 Shortness of breath: Secondary | ICD-10-CM

## 2021-01-26 DIAGNOSIS — E538 Deficiency of other specified B group vitamins: Secondary | ICD-10-CM

## 2021-01-26 DIAGNOSIS — D508 Other iron deficiency anemias: Secondary | ICD-10-CM

## 2021-01-26 DIAGNOSIS — E038 Other specified hypothyroidism: Secondary | ICD-10-CM | POA: Diagnosis not present

## 2021-01-26 DIAGNOSIS — E785 Hyperlipidemia, unspecified: Secondary | ICD-10-CM

## 2021-01-27 LAB — CBC WITH DIFFERENTIAL
Basophils Absolute: 0.1 10*3/uL (ref 0.0–0.2)
Basos: 1 %
EOS (ABSOLUTE): 0.1 10*3/uL (ref 0.0–0.4)
Eos: 1 %
Hematocrit: 42.4 % (ref 34.0–46.6)
Hemoglobin: 13.7 g/dL (ref 11.1–15.9)
Immature Grans (Abs): 0.1 10*3/uL (ref 0.0–0.1)
Immature Granulocytes: 1 %
Lymphocytes Absolute: 2.4 10*3/uL (ref 0.7–3.1)
Lymphs: 23 %
MCH: 30.4 pg (ref 26.6–33.0)
MCHC: 32.3 g/dL (ref 31.5–35.7)
MCV: 94 fL (ref 79–97)
Monocytes Absolute: 0.7 10*3/uL (ref 0.1–0.9)
Monocytes: 6 %
Neutrophils Absolute: 7 10*3/uL (ref 1.4–7.0)
Neutrophils: 68 %
RBC: 4.51 x10E6/uL (ref 3.77–5.28)
RDW: 12.7 % (ref 11.7–15.4)
WBC: 10.3 10*3/uL (ref 3.4–10.8)

## 2021-01-27 LAB — COMPREHENSIVE METABOLIC PANEL
ALT: 16 IU/L (ref 0–32)
AST: 19 IU/L (ref 0–40)
Albumin/Globulin Ratio: 1.8 (ref 1.2–2.2)
Albumin: 4.1 g/dL (ref 3.8–4.8)
Alkaline Phosphatase: 101 IU/L (ref 44–121)
BUN/Creatinine Ratio: 26 (ref 12–28)
BUN: 18 mg/dL (ref 8–27)
Bilirubin Total: 0.4 mg/dL (ref 0.0–1.2)
CO2: 26 mmol/L (ref 20–29)
Calcium: 9.6 mg/dL (ref 8.7–10.3)
Chloride: 100 mmol/L (ref 96–106)
Creatinine, Ser: 0.68 mg/dL (ref 0.57–1.00)
Globulin, Total: 2.3 g/dL (ref 1.5–4.5)
Glucose: 86 mg/dL (ref 65–99)
Potassium: 4 mmol/L (ref 3.5–5.2)
Sodium: 141 mmol/L (ref 134–144)
Total Protein: 6.4 g/dL (ref 6.0–8.5)
eGFR: 94 mL/min/{1.73_m2} (ref >59)

## 2021-01-27 LAB — INSULIN, RANDOM: INSULIN: 10.7 u[IU]/mL (ref 2.6–24.9)

## 2021-01-27 LAB — LIPID PANEL WITH LDL/HDL RATIO
Cholesterol, Total: 200 mg/dL — ABNORMAL HIGH (ref 100–199)
HDL: 83 mg/dL (ref >39)
LDL Chol Calc (NIH): 105 mg/dL — ABNORMAL HIGH (ref 0–99)
LDL/HDL Ratio: 1.3 ratio (ref 0.0–3.2)
Triglycerides: 65 mg/dL (ref 0–149)
VLDL Cholesterol Cal: 12 mg/dL (ref 5–40)

## 2021-01-27 LAB — HEMOGLOBIN A1C
Est. average glucose Bld gHb Est-mCnc: 103 mg/dL
Hgb A1c MFr Bld: 5.2 % (ref 4.8–5.6)

## 2021-01-27 LAB — T3: T3, Total: 81 ng/dL (ref 71–180)

## 2021-01-27 LAB — FOLATE: Folate: 7.3 ng/mL (ref >3.0)

## 2021-01-27 LAB — VITAMIN B12: Vitamin B-12: 355 pg/mL (ref 232–1245)

## 2021-01-27 LAB — TSH: TSH: 1.61 u[IU]/mL (ref 0.450–4.500)

## 2021-01-27 LAB — VITAMIN D 25 HYDROXY (VIT D DEFICIENCY, FRACTURES): Vit D, 25-Hydroxy: 42 ng/mL (ref 30.0–100.0)

## 2021-01-27 LAB — T4, FREE: Free T4: 1.34 ng/dL (ref 0.82–1.77)

## 2021-02-01 ENCOUNTER — Other Ambulatory Visit: Payer: Self-pay

## 2021-02-01 ENCOUNTER — Ambulatory Visit: Payer: Medicare Other | Admitting: Rheumatology

## 2021-02-01 VITALS — BP 150/89 | HR 62

## 2021-02-01 DIAGNOSIS — M170 Bilateral primary osteoarthritis of knee: Secondary | ICD-10-CM

## 2021-02-01 MED ORDER — TRIAMCINOLONE ACETONIDE 40 MG/ML IJ SUSP
40.0000 mg | INTRAMUSCULAR | Status: AC | PRN
  Administered 2021-02-01: 40 mg via INTRA_ARTICULAR

## 2021-02-01 MED ORDER — LIDOCAINE HCL 1 % IJ SOLN
1.5000 mL | INTRAMUSCULAR | Status: AC | PRN
  Administered 2021-02-01: 1.5 mL

## 2021-02-01 NOTE — Progress Notes (Signed)
   Procedure Note  Patient: Rebecca Brooks             Date of Birth: Feb 13, 1951           MRN: [id number]             Visit Date: 02/01/2021  Procedures: Visit Diagnoses:  1. Primary osteoarthritis of both knees   Patient had a good response to the left knee joint injection.  She came today to get the right knee joint injection.  She continues to have pain and discomfort in her right knee.  No warmth swelling or effusion was noted.  After informed consent was obtained right knee joint was injected with cortisone as described below.    Large Joint Inj: R knee on 02/01/2021 11:35 AM Indications: pain Details: 27 G needle, medial approach  Arthrogram: No  Medications: 1.5 mL lidocaine 1 %; 40 mg triamcinolone acetonide 40 MG/ML Aspirate: 0 mL Immediately prior to procedure a time out was called to verify the correct patient, procedure, equipment, support staff and site/side marked as required. Patient was prepped and draped in the usual sterile fashion.    Bo Merino, MD

## 2021-02-02 NOTE — Progress Notes (Signed)
Chief Complaint:   OBESITY Rebecca Brooks (MR# [id number]) is a 70 y.o. female who presents for evaluation and treatment of obesity and related comorbidities. Current BMI is Body mass index is 45.91 kg/m. Rebecca Brooks has been struggling with her weight for many years and has been unsuccessful in either losing weight, maintaining weight loss, or reaching her healthy weight goal.  Rebecca Brooks is status post gastric sleeve at [facility] 2019. She has gone from 328 lbs to 236 lbs in 6 months. She starting regaining soon after, and she is now at 251 lbs. She has recently lost 30 lbs this year on [place].  Rebecca Brooks is currently in the action stage of change and ready to dedicate time achieving and maintaining a healthier weight. Rebecca Brooks is interested in becoming our patient and working on intensive lifestyle modifications including (but not limited to) diet and exercise for weight loss.  Rebecca Brooks's habits were reviewed today and are as follows: Her family eats meals together, she thinks her family will eat healthier with her, she struggles with family and or coworkers weight loss sabotage, her desired weight loss is 51 lbs, she has been heavy most of her life, she started gaining weight in 1988, her heaviest weight ever was 328 pounds, she has significant food cravings issues, she snacks frequently in the evenings, she is frequently drinking liquids with calories, she frequently makes poor food choices, and she struggles with emotional eating.  Depression Screen Rebecca Brooks's Food and Mood (modified PHQ-9) score was 10.  Depression screen PHQ 2/9 01/26/2021  Decreased Interest 2  Down, Depressed, Hopeless 1  PHQ - 2 Score 3  Altered sleeping 0  Tired, decreased energy 1  Change in appetite 2  Feeling bad or failure about yourself  1  Trouble concentrating 1  Moving slowly or fidgety/restless 1  Suicidal thoughts 1  PHQ-9 Score 10  Difficult doing work/chores Somewhat difficult  Some recent data might be hidden    Subjective:   1. Other fatigue Rebecca Brooks admits to daytime somnolence and denies waking up still tired. Patent has a history of symptoms of daytime fatigue. Vickki generally gets 6 or 8 hours of sleep per night, and states that she has generally restful sleep. Snoring is not present. Apneic episodes are present. Epworth Sleepiness Score is 4.  2. SOB (shortness of breath) on exertion Rebecca Brooks notes increasing shortness of breath with exercising and seems to be worsening over time with weight gain. She notes getting out of breath sooner with activity than she used to. This has not gotten worse recently. Rebecca Brooks denies shortness of breath at rest or orthopnea.  3. Other specified hypothyroidism Rebecca Brooks is on Synthroid, and she is status post thyroidectomy in 1988. She has struggled with weight loss ever since then.  4. Vitamin D deficiency Rebecca Brooks has a history of low Vit D, and she notes fatigue.   5. B12 deficiency Rebecca Brooks has a history of B12 deficiency, and she notes fatigue. She denies a history of pernicious anemia, but she notes a history of weight loss surgery.  6. Other iron deficiency anemia Rebecca Brooks is status post weight loss surgery. She has a history of iron anemia, and she notes fatigue.  7. Hyperlipidemia, unspecified hyperlipidemia type Rebecca Brooks is on statin, and she is working on diet and weight loss.  8. Hyperglycemia Rebecca Brooks has a history of hyperglycemia, and she notes polyphagia.  Assessment/Plan:   1. Other fatigue Rebecca Brooks does feel that her weight is causing her energy to  be lower than it should be. Fatigue may be related to obesity, depression or many other causes. Labs will be ordered, and in the meanwhile, Lynix will focus on self care including making healthy food choices, increasing physical activity and focusing on stress reduction.  - EKG 12-Lead  2. SOB (shortness of breath) on exertion Roselyn does feel that she gets out of breath more easily that she used to when she exercises. Rebecca Brooks's  shortness of breath appears to be obesity related and exercise induced. She has agreed to work on weight loss and gradually increase exercise to treat her exercise induced shortness of breath. Will continue to monitor closely.  3. Other specified hypothyroidism We will check labs today, and Rebecca Brooks will continue Synthroid. Orders and follow up as documented in patient record.  Counseling Good thyroid control is important for overall health. Supratherapeutic thyroid levels are dangerous and will not improve weight loss results. Counseling: The correct way to take levothyroxine is fasting, with water, separated by at least 30 minutes from breakfast, and separated by more than 4 hours from calcium, iron, multivitamins, acid reflux medications (PPIs).   - TSH - T3 - T4, free  4. Vitamin D deficiency Low Vitamin D level contributes to fatigue and are associated with obesity, breast, and colon cancer. We will check labs today, and Rebecca Brooks will follow-up for routine testing of Vitamin D, at least 2-3 times per year to avoid over-replacement.  - VITAMIN D 25 Hydroxy (Vit-D Deficiency, Fractures)  5. B12 deficiency The diagnosis was reviewed with the patient. We will check labs today. Rebecca Brooks will continue her vitamins, and will continue to follow up as directed. Orders and follow up as documented in patient record.  - Vitamin B12  6. Other iron deficiency anemia We will check labs today. Rebecca Brooks will continue to follow up as directed. Orders and follow up as documented in patient record.  - Folate - CBC With Differential  7. Hyperlipidemia, unspecified hyperlipidemia type Cardiovascular risk and specific lipid/LDL goals reviewed.  We discussed several lifestyle modifications today. We will check labs today. Rebecca Brooks will start her eating plan, and will continue to work on exercise and weight loss efforts. Orders and follow up as documented in patient record.   - Lipid Panel With LDL/HDL Ratio  8.  Hyperglycemia Fasting labs will be obtained today, and results with be discussed with Rebecca Brooks in 2 weeks at her follow up visit. In the meanwhile Rebecca Brooks will start her eating plan and will work on weight loss efforts.  - Insulin, random - Comprehensive metabolic panel - Hemoglobin A1c  9. Screening for depression Rebecca Brooks had a positive depression screening. Depression is commonly associated with obesity and often results in emotional eating behaviors. We will monitor this closely and work on CBT to help improve the non-hunger eating patterns. Referral to Psychology may be required if no improvement is seen as she continues in our clinic.  10. Obesity with current BMI 46.0 Rebecca Brooks is currently in the action stage of change and her goal is to continue with weight loss efforts. I recommend Rebecca Brooks begin the structured treatment plan as follows:  She has agreed to keeping a food journal and adhering to recommended goals of 1200-1500 calories and 80+ grams of protein daily.  Exercise goals: No exercise has been prescribed for now, while concentrate on nutritional changes.  Behavioral modification strategies: increasing lean protein intake, meal planning and cooking strategies, and keeping a strict food journal.  She was informed of the importance  of frequent follow-up visits to maximize her success with intensive lifestyle modifications for her multiple health conditions. She was informed we would discuss her lab results at her next visit unless there is a critical issue that needs to be addressed sooner. Rebecca Brooks agreed to keep her next visit at the agreed upon time to discuss these results.  Objective:   Blood pressure 119/78, pulse 67, temperature (!) 97.5 F (36.4 C), height 5\' 2"  (1.575 m), weight 251 lb (113.9 kg), SpO2 99 %. Body mass index is 45.91 kg/m.  EKG: Normal sinus rhythm, rate 64 BPM.  Indirect Calorimeter completed today shows a VO2 of 253 and a REE of 1761.  Her calculated basal metabolic  rate is 2992 thus her basal metabolic rate is better than expected.  General: Cooperative, alert, well developed, in no acute distress. HEENT: Conjunctivae and lids unremarkable. Cardiovascular: Regular rhythm.  Lungs: Normal work of breathing. Neurologic: No focal deficits.   Lab Results  Component Value Date   CREATININE 0.68 01/26/2021   BUN 18 01/26/2021   NA 141 01/26/2021   K 4.0 01/26/2021   CL 100 01/26/2021   CO2 26 01/26/2021   Lab Results  Component Value Date   ALT 16 01/26/2021   AST 19 01/26/2021   ALKPHOS 101 01/26/2021   BILITOT 0.4 01/26/2021   Lab Results  Component Value Date   HGBA1C 5.2 01/26/2021   HGBA1C 5.2 02/16/2020   Lab Results  Component Value Date   INSULIN 10.7 01/26/2021   Lab Results  Component Value Date   TSH 1.610 01/26/2021   Lab Results  Component Value Date   CHOL 200 (H) 01/26/2021   HDL 83 01/26/2021   LDLCALC 105 (H) 01/26/2021   TRIG 65 01/26/2021   CHOLHDL 4 02/16/2020   Lab Results  Component Value Date   WBC 10.3 01/26/2021   HGB 13.7 01/26/2021   HCT 42.4 01/26/2021   MCV 94 01/26/2021   PLT 342.0 02/16/2020   Lab Results  Component Value Date   IRON 81 11/28/2019   TIBC 304 11/28/2019   FERRITIN 392 (H) 11/28/2019   Obesity Behavioral Intervention:   Approximately 15 minutes were spent on the discussion below.  ASK: We discussed the diagnosis of obesity with Rebecca Brooks today and Desarie agreed to give [place] permission to discuss obesity behavioral modification therapy today.  ASSESS: Lecia has the diagnosis of obesity and her BMI today is 45.9. Tenaya is in the action stage of change.   ADVISE: Elayne was educated on the multiple health risks of obesity as well as the benefit of weight loss to improve her health. She was advised of the need for long term treatment and the importance of lifestyle modifications to improve her current health and to decrease her risk of future health problems.  AGREE: Multiple dietary  modification options and treatment options were discussed and Madelin agreed to follow the recommendations documented in the above note.  ARRANGE: Azaliyah was educated on the importance of frequent visits to treat obesity as outlined per CMS and USPSTF guidelines and agreed to schedule her next follow up appointment today.  Attestation Statements:   Reviewed by clinician on day of visit: allergies, medications, problem list, medical history, surgical history, family history, social history, and previous encounter notes.   I, Trixie Dredge, am acting as transcriptionist for Dennard Nip, MD.  I have reviewed the above documentation for accuracy and completeness, and I agree with the above. - Dennard Nip, MD

## 2021-02-07 ENCOUNTER — Other Ambulatory Visit: Payer: Self-pay | Admitting: Family Medicine

## 2021-02-07 NOTE — Telephone Encounter (Signed)
Requesting: tramadol Contract:  UDS: 05/14/18 Last Visit: 02/16/20 Next Visit: 03/01/21 Last Refill: 12/13/20  Please Advise

## 2021-02-09 ENCOUNTER — Other Ambulatory Visit: Payer: Self-pay

## 2021-02-09 ENCOUNTER — Encounter: Payer: Self-pay | Admitting: Family

## 2021-02-09 ENCOUNTER — Encounter (INDEPENDENT_AMBULATORY_CARE_PROVIDER_SITE_OTHER): Payer: Self-pay | Admitting: Family Medicine

## 2021-02-09 ENCOUNTER — Ambulatory Visit (INDEPENDENT_AMBULATORY_CARE_PROVIDER_SITE_OTHER): Payer: Medicare Other | Admitting: Family Medicine

## 2021-02-09 VITALS — BP 138/82 | HR 62 | Temp 98.1°F | Ht 62.0 in | Wt 246.0 lb

## 2021-02-09 DIAGNOSIS — F3289 Other specified depressive episodes: Secondary | ICD-10-CM

## 2021-02-09 DIAGNOSIS — Z6841 Body Mass Index (BMI) 40.0 and over, adult: Secondary | ICD-10-CM | POA: Diagnosis not present

## 2021-02-09 DIAGNOSIS — E8881 Metabolic syndrome: Secondary | ICD-10-CM

## 2021-02-09 DIAGNOSIS — E785 Hyperlipidemia, unspecified: Secondary | ICD-10-CM

## 2021-02-09 DIAGNOSIS — I10 Essential (primary) hypertension: Secondary | ICD-10-CM

## 2021-02-09 DIAGNOSIS — E559 Vitamin D deficiency, unspecified: Secondary | ICD-10-CM | POA: Diagnosis not present

## 2021-02-09 MED ORDER — TOPIRAMATE 25 MG PO TABS: 25.0000 mg | ORAL_TABLET | Freq: Every day | ORAL | 0 refills | Status: DC

## 2021-02-14 DIAGNOSIS — G4733 Obstructive sleep apnea (adult) (pediatric): Secondary | ICD-10-CM | POA: Diagnosis not present

## 2021-02-17 NOTE — Progress Notes (Signed)
Chief Complaint:   OBESITY Addisson is here to discuss her progress with her obesity treatment plan along with follow-up of her obesity related diagnoses. Shebra is on keeping a food journal and adhering to recommended goals of 1200-1500 calories and 80+ grams of protein daily and states she is following her eating plan approximately 50-60% of the time. Czarina states she is doing 0 minutes 0 times per week.  Today's visit was #: 2 Starting weight: 251 lbs  Starting date: 01/26/2021 Today's weight: 246 lbs Today's date: 02/09/2021 Total lbs lost to date: 5 Total lbs lost since last in-office visit: 5  Interim History: Jenica has done well with weight loss in the last 2 weeks. She struggled to journal everyday, when she did journal she tried to meet her protein goal but still didn't quite meet her goal.  Subjective:   1. Hyperlipidemia, unspecified hyperlipidemia type Alilyana is on Zocor, and her LDL is slightly above 100, but her HDL and triglycerides are very good. I discussed labs with the patient today.  2. Essential hypertension Preston's blood pressure is stable on her medications. She is working on diet and weight loss. I discussed labs with the patient today.  3. Vitamin D deficiency Vannie is stable on Vit D, and she is due for labs. I discussed labs with the patient today.  4. Insulin resistance Clorinda has a new diagnosis of insulin resistance. Her fasting glucose and A1c are within normal limits. Her fasting insulin was mildly elevated above goal. I discussed labs with the patient today.  5. Other depression with emotional eating Mihika notes some emotional eating behaviors, and she is open to looking at medications which can help. She denies a history of nephrolithiasis or postmenopausal.   Assessment/Plan:   1. Hyperlipidemia, unspecified hyperlipidemia type Cardiovascular risk and specific lipid/LDL goals reviewed. We discussed several lifestyle modifications today. Reeghan will continue  to work on diet, exercise and weight loss efforts. Orders and follow up as documented in patient record.   2. Essential hypertension Elowynn will continue diet and exercise to improve blood pressure control. We will watch for signs of hypotension as she continues her lifestyle modifications.  3. Vitamin D deficiency Low Vitamin D level contributes to fatigue and are associated with obesity, breast, and colon cancer. Timaya will continue Vitamin D and we will recheck labs in 3 months. She will follow-up for routine testing of Vitamin D, at least 2-3 times per year to avoid over-replacement.  4. Insulin resistance Hydi will continue diet, exercise, and decreasing simple carbohydrates to help decrease the risk of diabetes. We will recheck labs in 3 months. Zarai agreed to follow-up with [place] as directed to closely monitor her progress.  5. Other depression with emotional eating Behavior modification techniques were discussed today to help Charnette deal with her emotional/non-hunger eating behaviors. Daneen agreed to start Topamax 25 mg qhs with no refills. Orders and follow up as documented in patient record.   - topiramate (TOPAMAX) 25 MG tablet; Take 1 tablet (25 mg total) by mouth at bedtime.  Dispense: 30 tablet; Refill: 0  6. Obesity with current BMI 45.1 Jayelynn is currently in the action stage of change. As such, her goal is to continue with weight loss efforts. She has agreed to keeping a food journal and adhering to recommended goals of 1200-1500 calories and 80+ grams of protein daily.   Exercise goals: No exercise has been prescribed at this time.  Behavioral modification strategies: increasing lean protein  intake.  Briona has agreed to follow-up with our clinic in 2 weeks. She was informed of the importance of frequent follow-up visits to maximize her success with intensive lifestyle modifications for her multiple health conditions.   Objective:   Blood pressure 138/82, pulse 62, temperature 98.1 F  (36.7 C), height '5\' 2"'$  (1.575 m), weight 246 lb (111.6 kg), SpO2 98 %. Body mass index is 44.99 kg/m.  General: Cooperative, alert, well developed, in no acute distress. HEENT: Conjunctivae and lids unremarkable. Cardiovascular: Regular rhythm.  Lungs: Normal work of breathing. Neurologic: No focal deficits.   Lab Results  Component Value Date   CREATININE 0.68 01/26/2021   BUN 18 01/26/2021   NA 141 01/26/2021   K 4.0 01/26/2021   CL 100 01/26/2021   CO2 26 01/26/2021   Lab Results  Component Value Date   ALT 16 01/26/2021   AST 19 01/26/2021   ALKPHOS 101 01/26/2021   BILITOT 0.4 01/26/2021   Lab Results  Component Value Date   HGBA1C 5.2 01/26/2021   HGBA1C 5.2 02/16/2020   Lab Results  Component Value Date   INSULIN 10.7 01/26/2021   Lab Results  Component Value Date   TSH 1.610 01/26/2021   Lab Results  Component Value Date   CHOL 200 (H) 01/26/2021   HDL 83 01/26/2021   LDLCALC 105 (H) 01/26/2021   TRIG 65 01/26/2021   CHOLHDL 4 02/16/2020   Lab Results  Component Value Date   VD25OH 42.0 01/26/2021   Lab Results  Component Value Date   WBC 10.3 01/26/2021   HGB 13.7 01/26/2021   HCT 42.4 01/26/2021   MCV 94 01/26/2021   PLT 342.0 02/16/2020   Lab Results  Component Value Date   IRON 81 11/28/2019   TIBC 304 11/28/2019   FERRITIN 392 (H) 11/28/2019    Obesity Behavioral Intervention:   Approximately 15 minutes were spent on the discussion below.  ASK: We discussed the diagnosis of obesity with Webb Silversmith today and Bergen agreed to give [place] permission to discuss obesity behavioral modification therapy today.  ASSESS: Dashae has the diagnosis of obesity and her BMI today is 44.98. Morrisa is in the action stage of change.   ADVISE: Airiana was educated on the multiple health risks of obesity as well as the benefit of weight loss to improve her health. She was advised of the need for long term treatment and the importance of lifestyle modifications to  improve her current health and to decrease her risk of future health problems.  AGREE: Multiple dietary modification options and treatment options were discussed and Sylina agreed to follow the recommendations documented in the above note.  ARRANGE: Purva was educated on the importance of frequent visits to treat obesity as outlined per CMS and USPSTF guidelines and agreed to schedule her next follow up appointment today.  Attestation Statements:   Reviewed by clinician on day of visit: allergies, medications, problem list, medical history, surgical history, family history, social history, and previous encounter notes.   I, Trixie Dredge, am acting as transcriptionist for Dennard Nip, MD.  I have reviewed the above documentation for accuracy and completeness, and I agree with the above. -  Dennard Nip, MD

## 2021-02-18 ENCOUNTER — Other Ambulatory Visit: Payer: Self-pay | Admitting: Family Medicine

## 2021-02-23 ENCOUNTER — Encounter (INDEPENDENT_AMBULATORY_CARE_PROVIDER_SITE_OTHER): Payer: Self-pay | Admitting: Family Medicine

## 2021-02-23 ENCOUNTER — Ambulatory Visit (INDEPENDENT_AMBULATORY_CARE_PROVIDER_SITE_OTHER): Payer: Medicare Other | Admitting: Family Medicine

## 2021-02-23 ENCOUNTER — Other Ambulatory Visit: Payer: Self-pay

## 2021-02-23 VITALS — BP 132/75 | HR 73 | Temp 97.7°F | Ht 62.0 in | Wt 246.0 lb

## 2021-02-23 DIAGNOSIS — F3289 Other specified depressive episodes: Secondary | ICD-10-CM | POA: Diagnosis not present

## 2021-02-23 DIAGNOSIS — F32A Depression, unspecified: Secondary | ICD-10-CM | POA: Insufficient documentation

## 2021-02-23 DIAGNOSIS — Z6841 Body Mass Index (BMI) 40.0 and over, adult: Secondary | ICD-10-CM | POA: Diagnosis not present

## 2021-02-24 ENCOUNTER — Other Ambulatory Visit: Payer: Self-pay | Admitting: Family Medicine

## 2021-02-24 NOTE — Progress Notes (Signed)
Chief Complaint:   OBESITY Rebecca Brooks is here to discuss her progress with her obesity treatment plan along with follow-up of her obesity related diagnoses. Rebecca Brooks is on keeping a food journal and adhering to recommended goals of 1200-1500 calories and 80 grams protein and states she is following her eating plan approximately 80% of the time. Rebecca Brooks states she is not currently exercising.  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 She is status post gastric sleeve (01/10/2018). High weight was 328 lbs and nadir 236 lbs. She struggles with eating at night. She has had a few evenings of having only popcorn and wine for dinner. She signed up for [employer] and is using the food. However, she would like to transition to all real food. She says she has also been on Optifast in the past few tears and is tired of shakes and prepared diet food. She is journaling fairly consistently.  Subjective:   1. Other depression with emotional eating Rebecca Brooks was started on the Topamax last OV and is taking it at bedtime. She feels it is helping her sleep and may be helping reduce snacking at night.  Assessment/Plan:   1. Other depression with emotional eating Rebecca Brooks is to begin taking Topamax dose at 4-5 PM. Behavior modification techniques were discussed today to help Rebecca Brooks deal with her emotional/non-hunger eating behaviors.   2. Obesity with current BMI 44.98  Rebecca Brooks is currently in the action stage of change. As such, her goal is to continue with weight loss efforts. She has agreed to keeping a food journal and adhering to recommended goals of 1200-1500 calories and 80 grams protein.   Exercise goals: No exercise has been prescribed at this time.  Behavioral modification strategies: increasing lean protein  intake, decreasing simple carbohydrates, decreasing liquid calories, and keeping a strict food journal.  Rebecca Brooks has agreed to follow-up with our clinic in 2 weeks. She was informed of the importance of frequent follow-up visits to maximize her success with intensive lifestyle modifications for her multiple health conditions.   Objective:   Blood pressure 132/75, pulse 73, temperature 97.7 F (36.5 C), height '5\' 2"'$  (1.575 m), weight 246 lb (111.6 kg), SpO2 97 %. Body mass index is 44.99 kg/m.  General: Cooperative, alert, well developed, in no acute distress. HEENT: Conjunctivae and lids unremarkable. Cardiovascular: Regular rhythm.  Lungs: Normal work of breathing. Neurologic: No focal deficits.   Lab Results  Component Value Date   CREATININE 0.68 01/26/2021   BUN 18 01/26/2021   NA 141 01/26/2021   K 4.0 01/26/2021   CL 100 01/26/2021   CO2 26 01/26/2021   Lab Results  Component Value Date   ALT 16 01/26/2021   AST 19 01/26/2021   ALKPHOS 101 01/26/2021   BILITOT 0.4 01/26/2021   Lab Results  Component Value Date   HGBA1C 5.2 01/26/2021   HGBA1C 5.2 02/16/2020   Lab Results  Component Value Date   INSULIN 10.7 01/26/2021   Lab Results  Component Value Date   TSH 1.610 01/26/2021   Lab Results  Component Value Date   CHOL 200 (H) 01/26/2021   HDL 83 01/26/2021   LDLCALC 105 (H) 01/26/2021   TRIG 65 01/26/2021   CHOLHDL 4 02/16/2020   Lab Results  Component  Value Date   VD25OH 42.0 01/26/2021   Lab Results  Component Value Date   WBC 10.3 01/26/2021   HGB 13.7 01/26/2021   HCT 42.4 01/26/2021   MCV 94 01/26/2021   PLT 342.0 02/16/2020   Lab Results  Component Value Date   IRON 81 11/28/2019   TIBC 304 11/28/2019   FERRITIN 392 (H) 11/28/2019    Obesity Behavioral Intervention:   Approximately 15 minutes were spent on the discussion below.  ASK: We discussed the diagnosis of obesity with Rebecca Brooks today and Rebecca Brooks agreed to give [place] permission to  discuss obesity behavioral modification therapy today.  ASSESS: Rebecca Brooks has the diagnosis of obesity and her BMI today is 45.1. Rebecca Brooks is in the action stage of change.   ADVISE: Rebecca Brooks was educated on the multiple health risks of obesity as well as the benefit of weight loss to improve her health. She was advised of the need for long term treatment and the importance of lifestyle modifications to improve her current health and to decrease her risk of future health problems.  AGREE: Multiple dietary modification options and treatment options were discussed and Rebecca Brooks agreed to follow the recommendations documented in the above note.  ARRANGE: Rebecca Brooks was educated on the importance of frequent visits to treat obesity as outlined per CMS and USPSTF guidelines and agreed to schedule her next follow up appointment today.  Attestation Statements:   Reviewed by clinician on day of visit: allergies, medications, problem list, medical history, surgical history, family history, social history, and previous encounter notes.  Coral Ceo, CMA, am acting as Location manager for [employer], [place].  I have reviewed the above documentation for accuracy and completeness, and I agree with the above. -  Georgianne Fick, FNP

## 2021-02-26 ENCOUNTER — Encounter: Payer: Self-pay | Admitting: Family Medicine

## 2021-02-28 ENCOUNTER — Telehealth (INDEPENDENT_AMBULATORY_CARE_PROVIDER_SITE_OTHER): Payer: Medicare Other | Admitting: Internal Medicine

## 2021-02-28 ENCOUNTER — Encounter: Payer: Self-pay | Admitting: Internal Medicine

## 2021-02-28 ENCOUNTER — Encounter: Payer: Self-pay | Admitting: Family

## 2021-02-28 VITALS — Wt 244.0 lb

## 2021-02-28 DIAGNOSIS — U071 COVID-19: Secondary | ICD-10-CM

## 2021-02-28 MED ORDER — NIRMATRELVIR/RITONAVIR (PAXLOVID)TABLET: 3.0000 | ORAL_TABLET | Freq: Two times a day (BID) | ORAL | 0 refills | Status: AC

## 2021-02-28 MED ORDER — BENZONATATE 200 MG PO CAPS: 200.0000 mg | ORAL_CAPSULE | Freq: Three times a day (TID) | ORAL | 0 refills | Status: DC | PRN

## 2021-02-28 NOTE — Telephone Encounter (Signed)
Left message on machine to call back and sent mychart message to call back to schedule VV.

## 2021-02-28 NOTE — Telephone Encounter (Signed)
Contact Type Call Who Is Calling Patient / Member / Family / Caregiver Call Type Triage / Clinical Relationship To Patient Self Return Phone Number [phone] (Primary) Chief Complaint BREATHING - shortness of breath or sounds breathless Reason for Call Symptomatic / Request for Health Information Initial Comment Caller states she has been having issues with breathing and not feeling well. She has a positive covid test. She has been using her inhaler more. She is wanting an rx for paxlovid. She had it in March 2020 and was given and rx for cough and she would like that too. She is also seeking more advice. Diff Pharm since hers is closed [phone] Translation No Nurse Assessment Nurse: Ysidro Evert, RN, Levada Dy Date/Time Eilene Ghazi Time): 02/26/2021 2:56:47 PM Confirm and document reason for call. If symptomatic, describe symptoms. ---Caller states she tested positive for covid this morning and she has a cough and she is having some shortness of breath. She is fatigued. No fever

## 2021-02-28 NOTE — Progress Notes (Signed)
Subjective:    [name], female    DOB: [birth year], 70 y.o.   MRN: [id number]  DOS:  02/28/2021 Type of visit - description: Virtual Visit via Video Note  I connected with the above patient  by a video enabled telemedicine application and verified that I am speaking with the correct person using two identifiers.   THIS ENCOUNTER IS A VIRTUAL VISIT DUE TO COVID-19 - PATIENT WAS NOT SEEN IN THE OFFICE. PATIENT HAS CONSENTED TO VIRTUAL VISIT / TELEMEDICINE VISIT   Location of patient: home  Location of provider: office  Persons participating in the virtual visit: patient, provider   I discussed the limitations of evaluation and management by telemedicine and the availability of in person appointments. The patient expressed understanding and agreed to proceed.  Acute Symptoms started 3 days ago with headache and malaise. 2 days ago she tested positive, she recalls that a few days prior she was in contact with somebody who later on was diagnosed with COVID. Since then, she developed other symptoms including head congestion, cough, some chest tightness for which she used her inhaler. She has checked her temperature: No fever Denies chest pain. Has some nausea but no vomiting or diarrhea  Review of Systems See above   Past Medical History:  Diagnosis Date   Abnormal nuclear stress test 08/23/2015   Anemia    Ankle fracture    Arthritis    Asthma    Back pain    Chicken pox as a child   Edema of both lower extremities    Essential hypertension 07/17/2011   ACEI d/c 06/2011 for refractory cough> resolved    GERD (gastroesophageal reflux disease)    Hyperlipidemia    Hypertension    Hypothyroidism    Insomnia 11/01/2014   Joint pain    Low back pain radiating to right leg    intermittent and occasional numbness of skin over right hip in certain positions   Measles as a child   Mumps as a child   Obesity    Osteoarthritis    Palpitation    Pneumonia     this October from which she has said inhailer   Recurrent epistaxis 02/20/2012   Rheumatoid arthritis ([place]) 09/10/2011   Bone on bone in knees Pain in L>R knees.    RLS (restless legs syndrome) 11/01/2014   Skin cancer    Sleep apnea    SOB (shortness of breath) on exertion    Spinal stenosis    Vitamin B 12 deficiency    Vitamin D deficiency     Past Surgical History:  Procedure Laterality Date   CARDIAC CATHETERIZATION N/A 08/23/2015   Procedure: Left Heart Cath and Coronary Angiography;  Surgeon: [name], MD;  Location: [facility];  Service: Cardiovascular;  Laterality: N/A;   CESAREAN SECTION     X 3   GASTRECTOMY  12/2017   INGUINAL HERNIA REPAIR Right 70 yrs old   THYROIDECTOMY     total for benign tumor, Parathyroid spared   TONSILLECTOMY     TOTAL HIP ARTHROPLASTY Right 2006   secondary to congenital  hip defect    Allergies as of 02/28/2021       Reactions   Bee Venom Anaphylaxis   Accupril [quinapril Hcl] Cough   Quinapril Cough   Quinapril-hydrochlorothiazide Cough   Rosuvastatin Other (See Comments)        Medication List        Accurate as of  February 28, 2021  3:46 PM. If you have any questions, ask your nurse or doctor.          albuterol 108 (90 Base) MCG/ACT inhaler Commonly known as: VENTOLIN HFA Inhale 1-2 puffs into the lungs every 6 (six) hours as needed for wheezing or shortness of breath.   aspirin EC 81 MG tablet Take 1 tablet (81 mg total) by mouth daily.   cetirizine 10 MG tablet Commonly known as: ZYRTEC TAKE 1 TABLET BY MOUTH DAILY   diclofenac sodium 1 % Gel Commonly known as: VOLTAREN APPLY 2-4 GRAMS TOPICALLY TO AFFECTED JOINT UP TO 4 TIMES DAILY   famotidine 20 MG tablet Commonly known as: PEPCID Take 1 tablet (20 mg total) by mouth at bedtime.   fluticasone 50 MCG/ACT nasal spray Commonly known as: FLONASE SHAKE LIQUID AND USE 2 SPRAYS IN EACH NOSTRIL DAILY   furosemide 20 MG tablet Commonly known  as: LASIX TAKE 2 TABLETS BY MOUTH DAILY FOR 5 DAYS THEN DECREASE TO 1 TABLET BY MOUTH DAILY AS NEEDED   hydrochlorothiazide 25 MG tablet Commonly known as: HYDRODIURIL TAKE 1 TABLET BY MOUTH DAILY   levothyroxine 100 MCG tablet Commonly known as: SYNTHROID TAKE 1 TABLET BY MOUTH DAILY BEFORE AND BREAKFAST   metoprolol tartrate 50 MG tablet Commonly known as: LOPRESSOR TAKE 1 TABLET(50 MG) BY MOUTH TWICE DAILY   nitroGLYCERIN 0.4 MG SL tablet Commonly known as: NITROSTAT Place 1 tablet (0.4 mg total) under the tongue every 5 (five) minutes as needed for chest pain.   pantoprazole 40 MG tablet Commonly known as: PROTONIX TAKE 1 TABLET(40 MG) BY MOUTH DAILY   potassium chloride SA 20 MEQ tablet Commonly known as: KLOR-CON TAKE 1 TABLET BY MOUTH DAILY AS NEEDED(DEPENDING ON LASIX USE)   rOPINIRole 0.5 MG tablet Commonly known as: REQUIP TAKE 1 TO 2 TABLETS(0.5 TO 1 MG) BY MOUTH AT BEDTIME AS NEEDED   simvastatin 40 MG tablet Commonly known as: ZOCOR Take 1 tablet (40 mg total) by mouth at bedtime.   topiramate 25 MG tablet Commonly known as: Topamax Take 1 tablet (25 mg total) by mouth at bedtime.   traMADol 50 MG tablet Commonly known as: ULTRAM TAKE 1 TO 2 TABLETS(50 TO 100 MG) BY MOUTH EVERY 8 HOURS AS NEEDED   VITAMIN B-12 PO Take by mouth daily.   zolpidem 10 MG tablet Commonly known as: AMBIEN TAKE 1 TABLET(10 MG) BY MOUTH AT BEDTIME AS NEEDED FOR SLEEP           Objective:   Physical Exam Wt 244 lb (110.7 kg)   BMI 44.63 kg/m  This is a virtual video visit, alert oriented x3, in no distress, no cough noted.  Nontoxic-appearing. O2 sat 98%.  Next    Assessment     70 year old female, PMH includes thyroid disease, GERD, high cholesterol, DJD, RLS, OSA, hyperglycemia, presents with:  COVID-19: Symptoms a started 3 days ago, she tested +  two days ago. Symptoms are mild to moderate. She is 69, had COVID before and had 3 COVID vaccines. Options  include paxlovid vs molnupiravir, elected paxlovid, recent renal function normal. She is very aware there are multiple interactions possible and we agreed to stop statins for 1 week. In addition recommend rest, fluids, Robitussin-DM, she requested Tessalon Perles. Call if not gradually better, ER if symptoms severe. She will be contagious for 10 days.  She is aware      I discussed the assessment and treatment plan with the patient.  The patient was provided an opportunity to ask questions and all were answered. The patient agreed with the plan and demonstrated an understanding of the instructions.   The patient was advised to call back or seek an in-person evaluation if the symptoms worsen or if the condition fails to improve as anticipated.

## 2021-02-28 NOTE — Telephone Encounter (Signed)
2nd after hours call  Who Is Calling Patient / Member / Family / Caregiver Call Type Triage / Clinical Relationship To Patient Self Return Phone Number [phone] (Primary) Chief Complaint Flu Symptom Reason for Call Symptomatic / Request for Health Information Initial Comment Caller is COVID+ and needs medication. Translation No Nurse Assessment Nurse: Genelle Gather, RN, Magda Paganini Date/Time (Eastern Time): 02/27/2021 2:52:15 PM Confirm and document reason for call. If symptomatic, describe symptoms. ---Caller states she is Covid positive. Symptoms started Friday. Tested positive yesterday. Exposed on Monday. Vaxed and boosted x 1. No fever. Symptoms are fatigue, cough, headache and congestion.

## 2021-03-01 ENCOUNTER — Ambulatory Visit: Payer: Medicare Other | Admitting: Family Medicine

## 2021-03-05 ENCOUNTER — Encounter (INDEPENDENT_AMBULATORY_CARE_PROVIDER_SITE_OTHER): Payer: Self-pay | Admitting: Family Medicine

## 2021-03-07 NOTE — Telephone Encounter (Signed)
Please advise 

## 2021-03-07 NOTE — Telephone Encounter (Signed)
Pt last seen by Dawn Whitmire, FNP.  

## 2021-03-09 ENCOUNTER — Other Ambulatory Visit: Payer: Self-pay

## 2021-03-09 ENCOUNTER — Encounter (INDEPENDENT_AMBULATORY_CARE_PROVIDER_SITE_OTHER): Payer: Self-pay | Admitting: Family Medicine

## 2021-03-09 ENCOUNTER — Telehealth (INDEPENDENT_AMBULATORY_CARE_PROVIDER_SITE_OTHER): Payer: Medicare Other | Admitting: Family Medicine

## 2021-03-09 ENCOUNTER — Other Ambulatory Visit (INDEPENDENT_AMBULATORY_CARE_PROVIDER_SITE_OTHER): Payer: Self-pay | Admitting: Family Medicine

## 2021-03-09 DIAGNOSIS — F3289 Other specified depressive episodes: Secondary | ICD-10-CM | POA: Diagnosis not present

## 2021-03-09 DIAGNOSIS — Z6841 Body Mass Index (BMI) 40.0 and over, adult: Secondary | ICD-10-CM | POA: Diagnosis not present

## 2021-03-09 DIAGNOSIS — G4733 Obstructive sleep apnea (adult) (pediatric): Secondary | ICD-10-CM | POA: Diagnosis not present

## 2021-03-09 MED ORDER — TOPIRAMATE 25 MG PO TABS: 25.0000 mg | ORAL_TABLET | Freq: Every day | ORAL | 0 refills | Status: DC

## 2021-03-10 NOTE — Progress Notes (Signed)
TeleHealth Visit:  Due to the COVID-19 pandemic, this visit was completed with telemedicine (audio/video) technology to reduce patient and provider exposure as well as to preserve personal protective equipment.   Sharnell has verbally consented to this TeleHealth visit. The patient is located at home, the provider is located at the [facility] office. The participants in this visit include the listed provider and patient. The visit was conducted today via video.  Chief Complaint: OBESITY Maggie is here to discuss her progress with her obesity treatment plan along with follow-up of her obesity related diagnoses. Sheridan is on keeping a food journal and adhering to recommended goals of 1200-1500 calories and 80 grams of protein and states she is following her eating plan approximately 15% of the time. Lumi states she is doing 0 minutes 0 times per week.  Today's visit was #: 4 Starting weight: 251 lbs Starting date: 01/26/2021  Interim History: Marylene has COVID and we are doing a virtual visit today. She feels she has maintained her weight. She has not been eating much other than soup due to being sick. She has also had some ice cream and Coke. She is leaving Saturday for the beach.  Subjective:   1. Other depression with emotional eating Malaijah says she tends to snack too much at night. She is taking Topamax 25 mg and feels this helps with cravings.  2. OSA (obstructive sleep apnea) Rena is compliant with CPAP when she is well. She has had issues with compliance while she is sick due to congestion.    Assessment/Plan:   1. Other depression with emotional eating   We will refill Topamax 25 at bedtime for 1 month with no refills. Orders and follow up as documented in patient record.    - topiramate (TOPAMAX) 25 MG tablet; Take 1 tablet (25 mg total) by mouth at bedtime.  Dispense: 30 tablet; Refill: 0  2. OSA (obstructive sleep apnea)  Camelle will continue CPAP. We will continue to  monitor.   3. Obesity with current BMI 44.98 Roula is currently in the action stage of change. As such, her goal is to continue with weight loss efforts. She has agreed to keeping a food journal and adhering to recommended goals of 1200-1500 calories and 80 grams of protein daily.  Exercise goals: No exercise has been prescribed at this time.  Behavioral modification strategies: travel eating strategies.  Tishawn has agreed to follow-up with our clinic in 2 weeks 03/23/2021 with Dr. Leafy Ro.  Objective:   VITALS: Per patient if applicable, see vitals. GENERAL: Alert and in no acute distress. CARDIOPULMONARY: No increased WOB. Speaking in clear sentences.  PSYCH: Pleasant and cooperative. Speech normal rate and rhythm. Affect is appropriate. Insight and judgement are appropriate. Attention is focused, linear, and appropriate.  NEURO: Oriented as arrived to appointment on time with no prompting.   Lab Results  Component Value Date   CREATININE 0.68 01/26/2021   BUN 18 01/26/2021   NA 141 01/26/2021   K 4.0 01/26/2021   CL 100 01/26/2021   CO2 26 01/26/2021   Lab Results  Component Value Date   ALT 16 01/26/2021   AST 19 01/26/2021   ALKPHOS 101 01/26/2021   BILITOT 0.4 01/26/2021   Lab Results  Component Value Date   HGBA1C 5.2 01/26/2021   HGBA1C 5.2 02/16/2020   Lab Results  Component Value Date   INSULIN 10.7 01/26/2021   Lab Results  Component Value Date   TSH  1.610 01/26/2021   Lab Results  Component Value Date   CHOL 200 (H) 01/26/2021   HDL 83 01/26/2021   LDLCALC 105 (H) 01/26/2021   TRIG 65 01/26/2021   CHOLHDL 4 02/16/2020   Lab Results  Component Value Date   VD25OH 42.0 01/26/2021   Lab Results  Component Value Date   WBC 10.3 01/26/2021   HGB 13.7 01/26/2021   HCT 42.4 01/26/2021   MCV 94 01/26/2021   PLT 342.0 02/16/2020   Lab Results  Component Value Date   IRON 81 11/28/2019   TIBC 304 11/28/2019   FERRITIN 392 (H) 11/28/2019     Attestation Statements:   Reviewed by clinician on day of visit: allergies, medications, problem list, medical history, surgical history, family history, social history, and previous encounter notes.   I, Lizbeth Bark, RMA, am acting as Location manager for [employer], [place].   I have reviewed the above documentation for accuracy and completeness, and I agree with the above. - Georgianne Fick, FNP

## 2021-03-18 ENCOUNTER — Other Ambulatory Visit: Payer: Self-pay | Admitting: Family Medicine

## 2021-03-23 ENCOUNTER — Encounter (INDEPENDENT_AMBULATORY_CARE_PROVIDER_SITE_OTHER): Payer: Self-pay | Admitting: Family Medicine

## 2021-03-23 ENCOUNTER — Ambulatory Visit (INDEPENDENT_AMBULATORY_CARE_PROVIDER_SITE_OTHER): Payer: Medicare Other | Admitting: Family Medicine

## 2021-03-23 ENCOUNTER — Other Ambulatory Visit: Payer: Self-pay | Admitting: Family Medicine

## 2021-03-23 ENCOUNTER — Other Ambulatory Visit: Payer: Self-pay | Admitting: Internal Medicine

## 2021-03-23 ENCOUNTER — Other Ambulatory Visit: Payer: Self-pay

## 2021-03-23 VITALS — BP 108/73 | HR 70 | Temp 97.9°F | Ht 62.0 in | Wt 251.0 lb

## 2021-03-23 DIAGNOSIS — R600 Localized edema: Secondary | ICD-10-CM | POA: Diagnosis not present

## 2021-03-23 DIAGNOSIS — Z6841 Body Mass Index (BMI) 40.0 and over, adult: Secondary | ICD-10-CM | POA: Diagnosis not present

## 2021-03-23 DIAGNOSIS — F3289 Other specified depressive episodes: Secondary | ICD-10-CM | POA: Diagnosis not present

## 2021-03-23 MED ORDER — TOPIRAMATE 25 MG PO TABS: 25.0000 mg | ORAL_TABLET | Freq: Every day | ORAL | 0 refills | Status: DC

## 2021-03-23 NOTE — Progress Notes (Signed)
Chief Complaint:   OBESITY Rodas is here to discuss her progress with her obesity treatment plan along with follow-up of her obesity related diagnoses. Charles is on keeping a food journal and adhering to recommended goals of 1200-1500 calories and 80 grams of protein daily and states she is following her eating plan approximately 10-20% of the time. Avreet states she is walking 7 times per week.  Today's visit was #: 5 Starting weight: 251 lbs Starting date: 01/26/2021 Today's weight: 251 lbs Today's date: 03/23/2021 Total lbs lost to date: 0 Total lbs lost since last in-office visit: 0  Interim History: Krystale has been on vacation recently after recovering from [place]. She did some celebration eating, and she is retaining some fluid today. She is ready to get back on track.  Subjective:   1. Edema of lower extremity Casundra notes increased lower extremity edema left > right, worse with increased sodium and heat. She notes it is better today.  2. Other depression with emotional eating Camay is stable on Topamax, and she is working on [employer]. No side effects were noted.  Assessment/Plan:   1. Edema of lower extremity Tyson is to get back to her sodium, decreasing simple carbohydrates, and increasing water. We will follow up in 2 weeks to reassess. She will continue Lasix as needed.  2. Other depression with emotional eating Behavior modification techniques were discussed today to help Marysue deal with her emotional/non-hunger eating behaviors. We will refill Topamax 25 mg qhs #30 for 1 month. Orders and follow up as documented in patient record.   3. Obesity with current BMI 45.9 Jalana is currently in the action stage of change. As such, her goal is to continue with weight loss efforts. She has agreed to the Category 3 Plan or keeping a food journal and adhering to recommended goals of 1200-1500 calories and 85 grams of protein daily.   Exercise goals: As is.  Behavioral  modification strategies: increasing lean protein intake.  Lidiana has agreed to follow-up with our clinic in 2 to 3 weeks. She was informed of the importance of frequent follow-up visits to maximize her success with intensive lifestyle modifications for her multiple health conditions.   Objective:   Blood pressure 108/73, pulse 70, temperature 97.9 F (36.6 C), height '5\' 2"'$  (1.575 m), weight 251 lb (113.9 kg), SpO2 96 %. Body mass index is 45.91 kg/m.  General: Cooperative, alert, well developed, in no acute distress. HEENT: Conjunctivae and lids unremarkable. Cardiovascular: Regular rhythm.  Lungs: Normal work of breathing. Neurologic: No focal deficits.   Lab Results  Component Value Date   CREATININE 0.68 01/26/2021   BUN 18 01/26/2021   NA 141 01/26/2021   K 4.0 01/26/2021   CL 100 01/26/2021   CO2 26 01/26/2021   Lab Results  Component Value Date   ALT 16 01/26/2021   AST 19 01/26/2021   ALKPHOS 101 01/26/2021   BILITOT 0.4 01/26/2021   Lab Results  Component Value Date   HGBA1C 5.2 01/26/2021   HGBA1C 5.2 02/16/2020   Lab Results  Component Value Date   INSULIN 10.7 01/26/2021   Lab Results  Component Value Date   TSH 1.610 01/26/2021   Lab Results  Component Value Date   CHOL 200 (H) 01/26/2021   HDL 83 01/26/2021   LDLCALC 105 (H) 01/26/2021   TRIG 65 01/26/2021   CHOLHDL 4 02/16/2020   Lab Results  Component Value Date   VD25OH 42.0 01/26/2021  Lab Results  Component Value Date   WBC 10.3 01/26/2021   HGB 13.7 01/26/2021   HCT 42.4 01/26/2021   MCV 94 01/26/2021   PLT 342.0 02/16/2020   Lab Results  Component Value Date   IRON 81 11/28/2019   TIBC 304 11/28/2019   FERRITIN 392 (H) 11/28/2019   Attestation Statements:   Reviewed by clinician on day of visit: allergies, medications, problem list, medical history, surgical history, family history, social history, and previous encounter notes.  Time spent on visit including pre-visit  chart review and post-visit care and charting was 45 minutes.    I, Trixie Dredge, am acting as transcriptionist for Dennard Nip, MD.  I have reviewed the above documentation for accuracy and completeness, and I agree with the above. -  Dennard Nip, MD

## 2021-03-28 ENCOUNTER — Other Ambulatory Visit: Payer: Self-pay | Admitting: Family Medicine

## 2021-03-29 NOTE — Telephone Encounter (Signed)
Requesting: tramadol Contract: 05/17/18 UDS: 05/17/18 Last Visit: 02/16/20 Next Visit: none Last Refill: 02/07/21  Please Advise

## 2021-04-08 ENCOUNTER — Ambulatory Visit (INDEPENDENT_AMBULATORY_CARE_PROVIDER_SITE_OTHER): Payer: Medicare Other | Admitting: Family Medicine

## 2021-04-15 DIAGNOSIS — M6281 Muscle weakness (generalized): Secondary | ICD-10-CM | POA: Diagnosis not present

## 2021-04-26 DIAGNOSIS — M47816 Spondylosis without myelopathy or radiculopathy, lumbar region: Secondary | ICD-10-CM | POA: Diagnosis not present

## 2021-04-27 ENCOUNTER — Ambulatory Visit (INDEPENDENT_AMBULATORY_CARE_PROVIDER_SITE_OTHER): Payer: Medicare Other | Admitting: Family Medicine

## 2021-05-04 ENCOUNTER — Encounter: Payer: Self-pay | Admitting: Family Medicine

## 2021-05-04 ENCOUNTER — Encounter (INDEPENDENT_AMBULATORY_CARE_PROVIDER_SITE_OTHER): Payer: Self-pay | Admitting: Family Medicine

## 2021-05-04 NOTE — Telephone Encounter (Signed)
Dr.Beasley 

## 2021-05-04 NOTE — Telephone Encounter (Signed)
Last OV with Dr. Beasley 

## 2021-05-11 DIAGNOSIS — M47816 Spondylosis without myelopathy or radiculopathy, lumbar region: Secondary | ICD-10-CM | POA: Diagnosis not present

## 2021-05-14 ENCOUNTER — Other Ambulatory Visit: Payer: Self-pay | Admitting: Family Medicine

## 2021-05-16 MED ORDER — TRAMADOL HCL 50 MG PO TABS: 50.0000 mg | ORAL_TABLET | Freq: Three times a day (TID) | ORAL | 0 refills | Status: DC | PRN

## 2021-05-16 NOTE — Telephone Encounter (Signed)
Requesting: tramadol 50mg   Contract: None UDS: 05/14/2018 Last Visit: 02/28/2021 w/ Dr. Larose Kells Next Visit: None Last Refill: 03/29/2021 #90 and 0RF Pt sig: 1-2 tab q8h prn  Please Advise

## 2021-05-17 ENCOUNTER — Encounter (INDEPENDENT_AMBULATORY_CARE_PROVIDER_SITE_OTHER): Payer: Self-pay | Admitting: Family Medicine

## 2021-05-17 ENCOUNTER — Ambulatory Visit (INDEPENDENT_AMBULATORY_CARE_PROVIDER_SITE_OTHER): Payer: Medicare Other | Admitting: Family Medicine

## 2021-05-17 ENCOUNTER — Other Ambulatory Visit: Payer: Self-pay

## 2021-05-17 VITALS — BP 110/69 | HR 59 | Temp 97.5°F | Ht 62.0 in | Wt 254.0 lb

## 2021-05-17 DIAGNOSIS — G8929 Other chronic pain: Secondary | ICD-10-CM

## 2021-05-17 DIAGNOSIS — M549 Dorsalgia, unspecified: Secondary | ICD-10-CM

## 2021-05-17 DIAGNOSIS — Z6841 Body Mass Index (BMI) 40.0 and over, adult: Secondary | ICD-10-CM

## 2021-05-17 DIAGNOSIS — F3289 Other specified depressive episodes: Secondary | ICD-10-CM | POA: Diagnosis not present

## 2021-05-17 MED ORDER — TOPIRAMATE 25 MG PO TABS: 25.0000 mg | ORAL_TABLET | Freq: Every day | ORAL | 0 refills | Status: DC

## 2021-05-17 NOTE — Progress Notes (Signed)
Chief Complaint:   OBESITY Rebecca Brooks is here to discuss her progress with her obesity treatment plan along with follow-up of her obesity related diagnoses. Rebecca Brooks is on the Category 3 Plan or keeping a food journal and adhering to recommended goals of 1200-1500 calories and 85 grams of protein daily and states she is following her eating plan approximately (unknown)% of the time. Rebecca Brooks states she is doing 0 minutes 0 times per week.  Today's visit was #: 6 Starting weight: 251 lbs Starting date: 01/26/2021 Today's weight: 254 lbs Today's date: 05/17/2021 Total lbs lost to date: 0 Total lbs lost since last in-office visit: 0  Interim History: Rebecca Brooks is status post gastric sleeve. She is struggling with losing the weight again. She has had temporary success with pre-packaged plans, but she gains her weight again. She is frustrated with the struggle, but she is ready to get back on track.  Subjective:   1. Chronic back pain, unspecified back location, unspecified back pain laterality Rebecca Brooks is getting treatment for her back issues. She has been told she needs to lose weight to optimize treatment and pain relief.  2. Other depression with emotional eating Rebecca Brooks is stable on Topamax, and she requests a refill today. She is working on decreasing emotional eating behaviors.  Assessment/Plan:   1. Chronic back pain, unspecified back location, unspecified back pain laterality Rebecca Brooks was given detailed instructions on journaling, nutrition, meal planning, and emotional eating behavior strategies to help with weight loss.   2. Other depression with emotional eating Behavior modification techniques were discussed today to help Rebecca Brooks deal with her emotional/non-hunger eating behaviors. We will refill Topamax 25 mg qhs #30 for 1 month. Orders and follow up as documented in patient record.   3. Obesity with current BMI 46.6 Rebecca Brooks is currently in the action stage of change. As such, her goal is to continue with  weight loss efforts. She has agreed to keeping a food journal and adhering to recommended goals of 1200-1500 calories and 90+ grams of protein daily.   Behavioral modification strategies: increasing lean protein intake and meal planning and cooking strategies.  Rebecca Brooks has agreed to follow-up with our clinic in 2 weeks. She was informed of the importance of frequent follow-up visits to maximize her success with intensive lifestyle modifications for her multiple health conditions.   Objective:   Blood pressure 110/69, pulse (!) 59, temperature (!) 97.5 F (36.4 C), height 5\' 2"  (1.575 m), weight 254 lb (115.2 kg), SpO2 98 %. Body mass index is 46.46 kg/m.  General: Cooperative, alert, well developed, in no acute distress. HEENT: Conjunctivae and lids unremarkable. Cardiovascular: Regular rhythm.  Lungs: Normal work of breathing. Neurologic: No focal deficits.   Lab Results  Component Value Date   CREATININE 0.68 01/26/2021   BUN 18 01/26/2021   NA 141 01/26/2021   K 4.0 01/26/2021   CL 100 01/26/2021   CO2 26 01/26/2021   Lab Results  Component Value Date   ALT 16 01/26/2021   AST 19 01/26/2021   ALKPHOS 101 01/26/2021   BILITOT 0.4 01/26/2021   Lab Results  Component Value Date   HGBA1C 5.2 01/26/2021   HGBA1C 5.2 02/16/2020   Lab Results  Component Value Date   INSULIN 10.7 01/26/2021   Lab Results  Component Value Date   TSH 1.610 01/26/2021   Lab Results  Component Value Date   CHOL 200 (H) 01/26/2021   HDL 83 01/26/2021   LDLCALC 105 (H) 01/26/2021  TRIG 65 01/26/2021   CHOLHDL 4 02/16/2020   Lab Results  Component Value Date   VD25OH 42.0 01/26/2021   Lab Results  Component Value Date   WBC 10.3 01/26/2021   HGB 13.7 01/26/2021   HCT 42.4 01/26/2021   MCV 94 01/26/2021   PLT 342.0 02/16/2020   Lab Results  Component Value Date   IRON 81 11/28/2019   TIBC 304 11/28/2019   FERRITIN 392 (H) 11/28/2019   Attestation Statements:   Reviewed by  clinician on day of visit: allergies, medications, problem list, medical history, surgical history, family history, social history, and previous encounter notes.  Time spent on visit including pre-visit chart review and post-visit care and charting was 33 minutes.    I, Trixie Dredge, am acting as transcriptionist for Dennard Nip, MD.  I have reviewed the above documentation for accuracy and completeness, and I agree with the above. -  Dennard Nip, MD

## 2021-05-27 ENCOUNTER — Encounter (INDEPENDENT_AMBULATORY_CARE_PROVIDER_SITE_OTHER): Payer: Self-pay | Admitting: Family Medicine

## 2021-05-30 ENCOUNTER — Other Ambulatory Visit: Payer: Self-pay | Admitting: Family Medicine

## 2021-05-30 MED ORDER — LEVOTHYROXINE SODIUM 100 MCG PO TABS: 100.0000 ug | ORAL_TABLET | Freq: Every day | ORAL | 0 refills | Status: DC

## 2021-05-31 ENCOUNTER — Ambulatory Visit (INDEPENDENT_AMBULATORY_CARE_PROVIDER_SITE_OTHER): Payer: Medicare Other | Admitting: Family Medicine

## 2021-06-04 ENCOUNTER — Encounter (INDEPENDENT_AMBULATORY_CARE_PROVIDER_SITE_OTHER): Payer: Self-pay | Admitting: Family Medicine

## 2021-06-06 NOTE — Telephone Encounter (Signed)
Please see message and advise.  Thank you. ° °
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
## 2021-06-20 ENCOUNTER — Other Ambulatory Visit: Payer: Self-pay | Admitting: Family Medicine

## 2021-06-20 MED ORDER — TRAMADOL HCL 50 MG PO TABS: 50.0000 mg | ORAL_TABLET | Freq: Three times a day (TID) | ORAL | 0 refills | Status: DC | PRN

## 2021-06-20 NOTE — Telephone Encounter (Signed)
Requesting: tramadol 50mg   Contract: None UDS: 05/14/2018 Last Visit: 02/28/2021 w/ Larose Kells Next Visit: None Last Refill: 05/16/2021 #90 and 0RF  Please Advise

## 2021-06-22 ENCOUNTER — Other Ambulatory Visit: Payer: Self-pay | Admitting: Family Medicine

## 2021-06-23 MED ORDER — ROPINIROLE HCL 0.5 MG PO TABS
0.5000 mg | ORAL_TABLET | Freq: Every evening | ORAL | 0 refills | Status: DC | PRN
Start: 2021-06-23 — End: 2021-08-10

## 2021-06-29 ENCOUNTER — Encounter: Payer: Self-pay | Admitting: Rheumatology

## 2021-06-29 ENCOUNTER — Other Ambulatory Visit: Payer: Self-pay

## 2021-06-29 ENCOUNTER — Ambulatory Visit: Payer: Medicare Other | Admitting: Rheumatology

## 2021-06-29 VITALS — BP 128/72 | HR 83 | Ht 62.25 in | Wt 263.0 lb

## 2021-06-29 DIAGNOSIS — M170 Bilateral primary osteoarthritis of knee: Secondary | ICD-10-CM

## 2021-06-29 DIAGNOSIS — Z862 Personal history of diseases of the blood and blood-forming organs and certain disorders involving the immune mechanism: Secondary | ICD-10-CM

## 2021-06-29 DIAGNOSIS — M5136 Other intervertebral disc degeneration, lumbar region: Secondary | ICD-10-CM

## 2021-06-29 DIAGNOSIS — M8589 Other specified disorders of bone density and structure, multiple sites: Secondary | ICD-10-CM

## 2021-06-29 DIAGNOSIS — Z96641 Presence of right artificial hip joint: Secondary | ICD-10-CM

## 2021-06-29 DIAGNOSIS — S92125S Nondisplaced fracture of body of left talus, sequela: Secondary | ICD-10-CM

## 2021-06-29 DIAGNOSIS — M19041 Primary osteoarthritis, right hand: Secondary | ICD-10-CM

## 2021-06-29 DIAGNOSIS — M19012 Primary osteoarthritis, left shoulder: Secondary | ICD-10-CM

## 2021-06-29 DIAGNOSIS — R609 Edema, unspecified: Secondary | ICD-10-CM

## 2021-06-29 DIAGNOSIS — F5101 Primary insomnia: Secondary | ICD-10-CM

## 2021-06-29 DIAGNOSIS — J449 Chronic obstructive pulmonary disease, unspecified: Secondary | ICD-10-CM

## 2021-06-29 DIAGNOSIS — Z8639 Personal history of other endocrine, nutritional and metabolic disease: Secondary | ICD-10-CM

## 2021-06-29 DIAGNOSIS — M19011 Primary osteoarthritis, right shoulder: Secondary | ICD-10-CM | POA: Diagnosis not present

## 2021-06-29 DIAGNOSIS — Z6841 Body Mass Index (BMI) 40.0 and over, adult: Secondary | ICD-10-CM

## 2021-06-29 DIAGNOSIS — M19042 Primary osteoarthritis, left hand: Secondary | ICD-10-CM

## 2021-06-29 DIAGNOSIS — Z8679 Personal history of other diseases of the circulatory system: Secondary | ICD-10-CM

## 2021-06-29 DIAGNOSIS — M5030 Other cervical disc degeneration, unspecified cervical region: Secondary | ICD-10-CM

## 2021-06-29 MED ORDER — TRIAMCINOLONE ACETONIDE 40 MG/ML IJ SUSP
40.0000 mg | INTRAMUSCULAR | Status: AC | PRN
  Administered 2021-06-29: 40 mg via INTRA_ARTICULAR

## 2021-06-29 MED ORDER — LIDOCAINE HCL 1 % IJ SOLN
2.0000 mL | INTRAMUSCULAR | Status: AC | PRN
Start: 2021-06-29 — End: 2021-06-29
  Administered 2021-06-29: 2 mL

## 2021-08-01 ENCOUNTER — Telehealth: Payer: Self-pay | Admitting: Family Medicine

## 2021-08-01 NOTE — Telephone Encounter (Signed)
Left message for patient to call back and schedule Medicare Annual Wellness Visit (AWV) in office.  ° °If not able to come in office, please offer to do virtually or by telephone.  Left office number and my jabber #[phone]. ° °Due for AWVI ° °Please schedule at anytime with Nurse Health Advisor. °  °

## 2021-08-05 ENCOUNTER — Other Ambulatory Visit: Payer: Self-pay | Admitting: Family Medicine

## 2021-08-09 ENCOUNTER — Other Ambulatory Visit: Payer: Self-pay | Admitting: Family Medicine

## 2021-08-10 ENCOUNTER — Other Ambulatory Visit: Payer: Self-pay | Admitting: Family Medicine

## 2021-08-10 ENCOUNTER — Other Ambulatory Visit: Payer: Self-pay

## 2021-08-10 ENCOUNTER — Telehealth: Payer: Self-pay | Admitting: Family Medicine

## 2021-08-10 MED ORDER — SIMVASTATIN 40 MG PO TABS
40.0000 mg | ORAL_TABLET | Freq: Every day | ORAL | 3 refills | Status: DC
Start: 2021-08-10 — End: 2022-08-01

## 2021-08-10 MED ORDER — ROPINIROLE HCL 1 MG PO TABS: 1.0000 mg | ORAL_TABLET | Freq: Every day | ORAL | 1 refills | Status: DC

## 2021-08-10 NOTE — Telephone Encounter (Signed)
Patient states she takes 2 pills of rOPINIRole (REQUIP) 0.5 MG tablet at night, not 1 like the bottle suggest. Because of what the bottles says, patient states she has run out of medication and they will not let her refill. Please advice.

## 2021-08-10 NOTE — Telephone Encounter (Signed)
Pt aware.

## 2021-08-12 ENCOUNTER — Other Ambulatory Visit: Payer: Self-pay | Admitting: Family Medicine

## 2021-08-13 ENCOUNTER — Encounter: Payer: Self-pay | Admitting: Family Medicine

## 2021-08-15 ENCOUNTER — Other Ambulatory Visit: Payer: Self-pay | Admitting: Family Medicine

## 2021-08-15 MED ORDER — TRAMADOL HCL 50 MG PO TABS: 50.0000 mg | ORAL_TABLET | Freq: Three times a day (TID) | ORAL | 0 refills | Status: DC | PRN

## 2021-08-15 NOTE — Telephone Encounter (Signed)
Requesting: Tramadol 50mg  Contract: UDS: Last Visit: 02/16/2020 ( 2 acutes within last 6 months) Next Visit: None w/PCP (AWV 08/29/21) Last Refill: 06/20/2021 #90 x 0RF  Please Advise

## 2021-08-16 DIAGNOSIS — G4733 Obstructive sleep apnea (adult) (pediatric): Secondary | ICD-10-CM | POA: Diagnosis not present

## 2021-08-18 ENCOUNTER — Other Ambulatory Visit: Payer: Self-pay | Admitting: Family Medicine

## 2021-08-22 ENCOUNTER — Encounter: Payer: Self-pay | Admitting: Family Medicine

## 2021-08-22 ENCOUNTER — Ambulatory Visit (INDEPENDENT_AMBULATORY_CARE_PROVIDER_SITE_OTHER): Payer: Medicare Other | Admitting: Family Medicine

## 2021-08-22 ENCOUNTER — Telehealth: Payer: Self-pay | Admitting: Family Medicine

## 2021-08-22 VITALS — BP 126/73 | HR 96 | Temp 98.4°F | Ht 62.25 in | Wt 261.0 lb

## 2021-08-22 DIAGNOSIS — J40 Bronchitis, not specified as acute or chronic: Secondary | ICD-10-CM

## 2021-08-22 LAB — POCT INFLUENZA A/B
Influenza A, POC: NEGATIVE
Influenza B, POC: NEGATIVE

## 2021-08-22 MED ORDER — GUAIFENESIN ER 600 MG PO TB12: 1200.0000 mg | ORAL_TABLET | Freq: Two times a day (BID) | ORAL | 2 refills | Status: DC

## 2021-08-22 MED ORDER — PREDNISONE 20 MG PO TABS: 40.0000 mg | ORAL_TABLET | Freq: Every day | ORAL | 0 refills | Status: AC

## 2021-08-22 MED ORDER — ALBUTEROL SULFATE HFA 108 (90 BASE) MCG/ACT IN AERS: 1.0000 | INHALATION_SPRAY | Freq: Four times a day (QID) | RESPIRATORY_TRACT | 5 refills | Status: DC | PRN

## 2021-08-22 MED ORDER — AMOXICILLIN-POT CLAVULANATE 875-125 MG PO TABS: 1.0000 | ORAL_TABLET | Freq: Two times a day (BID) | ORAL | 0 refills | Status: AC

## 2021-08-22 MED ORDER — POCKET SPACER DEVI: 1.0000 | 0 refills | Status: DC | PRN

## 2021-08-22 NOTE — Telephone Encounter (Signed)
scheduled

## 2021-08-22 NOTE — Progress Notes (Signed)
Acute Office Visit  Subjective:    [name], female    DOB: [birth year], 71 y.o.   MRN: [id number]  Chief Complaint  Patient presents with   Cough    Neg covid test this morning    Cough  Patient is in today for cough.  Patient reports she started feeling poorly about 4 days ago. Symptoms gradually progressed and she has started having to use her albuterol inhaler at least 4 times per day. She is reporting dyspnea, fatigue, chest congestion, nasal congestion, body aches, chills. Reports history of asthma, bronchitis, pneumonia. Reports she was with a friend in the car on Tuesday who had similar symptoms and is now recovering on antibiotics. No known flu contacts, but she would like to be tested for flu. She had a negative COVID test at home today. States she has never been diagnosed with COPD. Smoked occasionally as a teenager, but nothing regular.    So far she has been taking tylenol, tessalon, and inhaler.       Past Medical History:  Diagnosis Date   Abnormal nuclear stress test 08/23/2015   Anemia    Ankle fracture    Arthritis    Asthma    Back pain    Chicken pox as a child   Edema of both lower extremities    Essential hypertension 07/17/2011   ACEI d/c 06/2011 for refractory cough> resolved    GERD (gastroesophageal reflux disease)    Hyperlipidemia    Hypertension    Hypothyroidism    Insomnia 11/01/2014   Joint pain    Low back pain radiating to right leg    intermittent and occasional numbness of skin over right hip in certain positions   Measles as a child   Mumps as a child   Obesity    Osteoarthritis    Palpitation    Pneumonia    this October from which she has said inhailer   Recurrent epistaxis 02/20/2012   Rheumatoid arthritis ([place]) 09/10/2011   Bone on bone in knees Pain in L>R knees.    RLS (restless legs syndrome) 11/01/2014   Skin cancer    Sleep apnea    SOB (shortness of breath) on exertion    Spinal stenosis     Vitamin B 12 deficiency    Vitamin D deficiency     Past Surgical History:  Procedure Laterality Date   CARDIAC CATHETERIZATION N/A 08/23/2015   Procedure: Left Heart Cath and Coronary Angiography;  Surgeon: [name], MD;  Location: [facility];  Service: Cardiovascular;  Laterality: N/A;   CESAREAN SECTION     X 3   GASTRECTOMY  12/2017   INGUINAL HERNIA REPAIR Right 71 yrs old   THYROIDECTOMY     total for benign tumor, Parathyroid spared   TONSILLECTOMY     TOTAL HIP ARTHROPLASTY Right 2006   secondary to congenital  hip defect    Family History  Problem Relation Age of Onset   Cancer Mother    Heart failure Mother    Lung cancer Mother 18       smoker   Heart Problems Mother        tachycardia   Hearing loss Mother    Anxiety disorder Mother        anxiety, claustrophobia   Obesity Mother    Osteoarthritis Father    Hyperlipidemia Father    Anuerysm Father        AAA  Sudden death Father    Hyperlipidemia Sister    Migraines Sister    Hypertension Sister    Heart Problems Sister        tachycardia   Colon cancer Maternal Grandmother    Heart attack Maternal Grandfather    Aneurysm Paternal Grandfather        abdominal   Heart disease Paternal Grandfather        AAA rupture, smoker    Social History   Socioeconomic History   Marital status: Married    Spouse name: Not on file   Number of children: 3   Years of education: Not on file   Highest education level: Not on file  Occupational History   Occupation: Training and development officer   Occupation: Primary school teacher   Occupation: Retired Adult nurse  Tobacco Use   Smoking status: Former    Packs/day: 0.50    Years: 5.00    Pack years: 2.50    Types: Cigarettes    Quit date: 07/25/1971    Years since quitting: 50.1   Smokeless tobacco: Never  Vaping Use   Vaping Use: Never used  Substance and Sexual Activity   Alcohol use: Yes    Comment: OCC   Drug use: No   Sexual activity: Never    Comment: lives with  husband, 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 1 each 1   aspirin EC 81 MG tablet Take 1 tablet (81 mg total) by mouth daily.     benzonatate (TESSALON) 200 MG capsule Take 1 capsule (200 mg total) by mouth 3 (three) times daily as needed for cough. 20 capsule 0   cetirizine (ZYRTEC) 10 MG tablet TAKE 1 TABLET BY MOUTH DAILY 90 tablet 0   Cyanocobalamin (VITAMIN B-12 PO) Take by mouth daily.     diclofenac sodium (VOLTAREN) 1 % GEL APPLY 2-4 GRAMS TOPICALLY TO AFFECTED JOINT UP TO 4 TIMES DAILY 400 g 2   famotidine (PEPCID) 20 MG tablet Take 1 tablet (20 mg total) by mouth at bedtime. 90 tablet 3   fluticasone (FLONASE) 50 MCG/ACT nasal spray SHAKE LIQUID AND USE 2 SPRAYS IN EACH NOSTRIL DAILY 48 g 1   furosemide (LASIX) 20 MG tablet TAKE 2 TABLETS BY MOUTH DAILY FOR 5 DAYS THEN DECREASE TO 1 TABLET BY MOUTH DAILY AS NEEDED 180 tablet 0   hydrochlorothiazide (HYDRODIURIL) 25 MG tablet TAKE 1 TABLET BY MOUTH DAILY 90 tablet 1   levothyroxine (SYNTHROID) 100 MCG tablet Take 1 tablet (100 mcg total) by mouth daily before breakfast. 90 tablet 0   metoprolol tartrate (LOPRESSOR) 50 MG tablet TAKE 1 TABLET(50 MG) BY MOUTH TWICE DAILY 60 tablet 1   nitroGLYCERIN (NITROSTAT) 0.4 MG SL tablet Place 1 tablet (0.4 mg total) under the tongue every 5 (five) minutes as needed for chest pain. 25 tablet 3   pantoprazole (PROTONIX) 40 MG tablet TAKE  1  TABLET(40 MG) BY MOUTH DAILY 90 tablet 3   potassium chloride SA (K-DUR,KLOR-CON) 20 MEQ tablet TAKE 1 TABLET BY MOUTH DAILY AS NEEDED(DEPENDING ON LASIX USE) 90 tablet 3   rOPINIRole (REQUIP) 1 MG tablet Take 1 tablet (1 mg total) by mouth at bedtime. 90 tablet 1   simvastatin (ZOCOR) 40 MG tablet Take 1 tablet (40 mg total) by mouth at bedtime. 90 tablet 3   topiramate (TOPAMAX) 25 MG tablet Take 1 tablet (25 mg total) by mouth at bedtime. 30 tablet 0   traMADol (ULTRAM) 50 MG tablet Take 1 tablet (50 mg total) by mouth 3 (three) times daily as needed. 90 tablet 0   zolpidem (AMBIEN) 10 MG tablet TAKE 1 TABLET(10 MG) BY MOUTH AT BEDTIME AS NEEDED FOR SLEEP 30 tablet 0   No facility-administered medications prior to visit.    Allergies  Allergen Reactions   Bee Venom Anaphylaxis   Accupril [Quinapril Hcl] Cough   Quinapril Cough   Quinapril-Hydrochlorothiazide Cough   Rosuvastatin Other (See Comments)    Review of systems All review of systems negative except what is listed in the HPI     Objective:    Physical Exam Vitals reviewed.  Constitutional:      Appearance: Normal appearance. She is obese.  HENT:     Head: Normocephalic and atraumatic.     Right Ear: Tympanic membrane normal.     Left Ear: Tympanic membrane normal.  Cardiovascular:     Rate and Rhythm: Normal rate and regular rhythm.  Pulmonary:     Effort: Pulmonary effort is normal.     Breath sounds: Wheezing present. No rhonchi or rales.  Skin:    General: Skin is warm and dry.  Neurological:     General: No focal deficit present.     Mental Status: She is alert and oriented to person, place, and time. Mental status is at baseline.  Psychiatric:        Mood and Affect: Mood normal.        Behavior: Behavior normal.        Thought Content: Thought content normal.        Judgment: Judgment normal.    BP (!) 169/65    Pulse 96    Temp 98.4 F (36.9 C)    Ht 5' 2.25" (1.581 m)    Wt 261 lb (118.4 kg)     SpO2 96%    BMI 47.35 kg/m  Wt Readings from Last 3 Encounters:  08/22/21 261 lb (118.4 kg)  06/29/21 263 lb (119.3 kg)  05/17/21 254 lb (115.2 kg)    Health Maintenance Due  Topic Date Due   Hepatitis C Screening  Never done   Zoster Vaccines- Shingrix (1 of 2) Never done   COLONOSCOPY (Pts 45-44yrs Insurance coverage will need to be confirmed)  07/25/2015   Pneumonia Vaccine 9+ Years old (3 - PPSV23 if available, else PCV20) 05/15/2019   COVID-19 Vaccine (4 - Booster for Pfizer series) 07/31/2020   INFLUENZA VACCINE  02/21/2021   TETANUS/TDAP  07/31/2021    There are no preventive care reminders to display for this patient.   Lab Results  Component Value Date   TSH 1.610 01/26/2021   Lab Results  Component Value Date   WBC 10.3 01/26/2021   HGB 13.7 01/26/2021   HCT 42.4 01/26/2021   MCV 94 01/26/2021   PLT 342.0 02/16/2020   Lab Results  Component Value Date   NA 141 01/26/2021  K 4.0 01/26/2021   CHLORIDE 103 12/21/2016   CO2 26 01/26/2021   GLUCOSE 86 01/26/2021   BUN 18 01/26/2021   CREATININE 0.68 01/26/2021   BILITOT 0.4 01/26/2021   ALKPHOS 101 01/26/2021   AST 19 01/26/2021   ALT 16 01/26/2021   PROT 6.4 01/26/2021   ALBUMIN 4.1 01/26/2021   CALCIUM 9.6 01/26/2021   ANIONGAP 10 09/09/2020   EGFR 94 01/26/2021   GFR 77.67 02/16/2020   Lab Results  Component Value Date   CHOL 200 (H) 01/26/2021   Lab Results  Component Value Date   HDL 83 01/26/2021   Lab Results  Component Value Date   LDLCALC 105 (H) 01/26/2021   Lab Results  Component Value Date   TRIG 65 01/26/2021   Lab Results  Component Value Date   CHOLHDL 4 02/16/2020   Lab Results  Component Value Date   HGBA1C 5.2 01/26/2021       Assessment & Plan:   1. Bronchitis Flu negative.  Likely Viral - recommending a few more days of supportive measures.  Continue supportive measures including rest, hydration, humidifier use, steam showers, warm compresses to sinuses,  warm liquids with lemon and honey, and over-the-counter cough, cold, and analgesics as needed.   Adding Prednisone, Mucinex. Refilling inhaler and adding spacer.  If no improvement after a few days of supportive measures or if symptoms worsen, then you can start the antibiotics.   - Spacer/Aero-Holding Chambers (POCKET SPACER) DEVI; 1 each by Does not apply route as needed.  Dispense: 1 each; Refill: 0 - predniSONE (DELTASONE) 20 MG tablet; Take 2 tablets (40 mg total) by mouth daily with breakfast for 5 days.  Dispense: 10 tablet; Refill: 0 - guaiFENesin (MUCINEX) 600 MG 12 hr tablet; Take 2 tablets (1,200 mg total) by mouth 2 (two) times daily.  Dispense: 30 tablet; Refill: 2 - amoxicillin-clavulanate (AUGMENTIN) 875-125 MG tablet; Take 1 tablet by mouth 2 (two) times daily for 10 days.  Dispense: 20 tablet; Refill: 0 - albuterol (VENTOLIN HFA) 108 (90 Base) MCG/ACT inhaler; Inhale 1-2 puffs into the lungs every 6 (six) hours as needed for wheezing or shortness of breath.  Dispense: 1 each; Refill: 5 - POCT Influenza A/B   Follow-up if symptoms worsen or fail to improve.   Terrilyn Saver, NP

## 2021-08-22 NOTE — Patient Instructions (Signed)
Refilling your inhaler and sending in a spacer.  Adding prednisone and mucinex. Continue supportive measures including rest, hydration, humidifier use, steam showers, warm compresses to sinuses, warm liquids with lemon and honey, and over-the-counter cough, cold, and analgesics as needed.   If no improvement in the next 2-3 days, or if symptoms get significantly worse before then, you can start the antibiotics.   Please contact office for follow-up if symptoms do not improve or worsen. Seek emergency care if symptoms become severe.

## 2021-08-22 NOTE — Telephone Encounter (Signed)
Pt stated she is congested and has a cough. This is making her wheeze/ short of breath. Scheduled her today with Lovena Le and also triaged.

## 2021-08-22 NOTE — Telephone Encounter (Signed)
Nurse Assessment Nurse: Zenia Resides, RN, Diane Date/Time Eilene Ghazi Time): 08/22/2021 9:20:35 AM Confirm and document reason for call. If symptomatic, describe symptoms. ---Caller states she has a cough and congestion. States her chest hurts/is tight. O2 sat is 96%. Symptoms started yesterday. No fever. Pt. is also wheezing. Used inhaler which helped a little. Does the patient have any new or worsening symptoms? ---Yes Will a triage be completed? ---Yes Related visit to physician within the last 2 weeks? ---No Does the PT have any chronic conditions? (i.e. diabetes, asthma, this includes High risk factors for pregnancy, etc.) ---Yes List chronic conditions. ---HTN, pneumonia Is this a behavioral health or substance abuse call? ---No Guidelines Guideline Title Affirmed Question Affirmed Notes Nurse Date/Time (Eastern Time) COVID-19 - Diagnosed or Suspected Chest pain or pressure (Exception: MILD central chest pain, present only when coughing) Zenia Resides, RN, Diane 08/22/2021 9:23:26 AM PLEASE NOTE: All timestamps contained within this report are represented as [place] Standard Time. CONFIDENTIALTY NOTICE: This fax transmission is intended only for the addressee. It contains information that is legally privileged, confidential or otherwise protected from use or disclosure. If you are not the intended recipient, you are strictly prohibited from reviewing, disclosing, copying using or disseminating any of this information or taking any action in reliance on or regarding this information. If you have received this fax in error, please notify us immediately by telephone so that we can arrange for its return to [place]. Phone: [phone], Toll-Free: [phone], Fax: [phone] Page: 2 of 2 Call Id: [id number] [place]. Time Eilene Ghazi Time) Disposition Final User 08/22/2021 9:16:19 AM Send to Urgent Micheal Likens 08/22/2021 9:27:20 AM Go to ED Now (or PCP triage) Yes Zenia Resides, RN, Diane Caller  Disagree/Comply Disagree Caller Understands Yes PreDisposition Call Doctor Care Advice Given Per Guideline GO TO ED NOW (OR PCP TRIAGE): * IF NO PCP (PRIMARY CARE PROVIDER) SECOND-LEVEL TRIAGE: You need to be seen within the next hour. Go to the [facility] at _____________ [place] as soon as you can. CARE ADVICE given per COVID-19 - DIAGNOSED OR SUSPECTED (Adult) guideline. Comments User: Hildred Priest, RN Date/Time Eilene Ghazi Time): 08/22/2021 9:28:21 AM Pt. states she has a 2 pm virtual visit and is going to change it to an in person visit. Does not want to go to ER or UC. States if no in person appt. availble, then she will go in to [facility] or ER Referrals GO TO FACILITY REFUSED

## 2021-08-23 ENCOUNTER — Encounter: Payer: Self-pay | Admitting: Family Medicine

## 2021-08-26 ENCOUNTER — Other Ambulatory Visit: Payer: Self-pay

## 2021-08-26 ENCOUNTER — Encounter: Payer: Self-pay | Admitting: Family Medicine

## 2021-08-26 MED ORDER — BENZONATATE 200 MG PO CAPS: 200.0000 mg | ORAL_CAPSULE | Freq: Three times a day (TID) | ORAL | 0 refills | Status: DC | PRN

## 2021-08-26 NOTE — Progress Notes (Signed)
Subjective:   Rebecca Brooks is a 71 y.o. female who presents for an Initial Medicare Annual Wellness Visit.  I connected with Rebecca Brooks today by telephone and verified that I am speaking with the correct person using two identifiers. Location patient: home Location provider: work Persons participating in the virtual visit: patient, Marine scientist.    I discussed the limitations, risks, security and privacy concerns of performing an evaluation and management service by telephone and the availability of in person appointments. I also discussed with the patient that there may be a patient responsible charge related to this service. The patient expressed understanding and verbally consented to this telephonic visit.    Interactive audio and video telecommunications were attempted between this provider and patient, however failed, due to patient having technical difficulties OR patient did not have access to video capability.  We continued and completed visit with audio only.  Some vital signs may be absent or patient reported.   Time Spent with patient on telephone encounter: 35 minutes   Review of Systems     Cardiac Risk Factors include: advanced age (>21men, >26 women);hypertension;dyslipidemia;obesity (BMI >30kg/m2);sedentary lifestyle     Objective:    Today's Vitals   08/29/21 1022  Weight: 261 lb (118.4 kg)  Height: 5' 2.25" (1.581 m)   Body mass index is 47.35 kg/m.  Advanced Directives 08/29/2021 09/09/2020 11/28/2019 05/30/2019 02/20/2019 11/29/2018 04/04/2018  Does Patient Have a Medical Advance Directive? Yes No Yes No Yes Yes Yes  Type of Paramedic of [place];Living will - [place];Living will - [place];Living will Living will Living will  Does patient want to make changes to medical advance directive? - - No - Patient declined - No - Patient declined No - Patient declined -  Copy of [place] in  Chart? No - copy requested - No - copy requested - No - copy requested - -  Would patient like information on creating a medical advance directive? - - - No - Patient declined - - -    Current Medications (verified) Outpatient Encounter Medications as of 08/29/2021  Medication Sig   albuterol (VENTOLIN HFA) 108 (90 Base) MCG/ACT inhaler Inhale 1-2 puffs into the lungs every 6 (six) hours as needed for wheezing or shortness of breath.   amoxicillin-clavulanate (AUGMENTIN) 875-125 MG tablet Take 1 tablet by mouth 2 (two) times daily for 10 days.   aspirin EC 81 MG tablet Take 1 tablet (81 mg total) by mouth daily.   benzonatate (TESSALON) 200 MG capsule Take 1 capsule (200 mg total) by mouth 3 (three) times daily as needed for cough.   cetirizine (ZYRTEC) 10 MG tablet TAKE 1 TABLET BY MOUTH DAILY   Cyanocobalamin (VITAMIN B-12 PO) Take by mouth daily.   diclofenac sodium (VOLTAREN) 1 % GEL APPLY 2-4 GRAMS TOPICALLY TO AFFECTED JOINT UP TO 4 TIMES DAILY   famotidine (PEPCID) 20 MG tablet Take 1 tablet (20 mg total) by mouth at bedtime.   fluticasone (FLONASE) 50 MCG/ACT nasal spray SHAKE LIQUID AND USE 2 SPRAYS IN EACH NOSTRIL DAILY   furosemide (LASIX) 20 MG tablet TAKE 2 TABLETS BY MOUTH DAILY FOR 5 DAYS THEN DECREASE TO 1 TABLET BY MOUTH DAILY AS NEEDED   guaiFENesin (MUCINEX) 600 MG 12 hr tablet Take 2 tablets (1,200 mg total) by mouth 2 (two) times daily.   hydrochlorothiazide (HYDRODIURIL) 25 MG tablet TAKE 1 TABLET BY MOUTH DAILY   levothyroxine (SYNTHROID) 100 MCG  tablet TAKE 1 TABLET(100 MCG) BY MOUTH DAILY BEFORE BREAKFAST   metoprolol tartrate (LOPRESSOR) 50 MG tablet TAKE 1 TABLET(50 MG) BY MOUTH TWICE DAILY   nitroGLYCERIN (NITROSTAT) 0.4 MG SL tablet Place 1 tablet (0.4 mg total) under the tongue every 5 (five) minutes as needed for chest pain.   pantoprazole (PROTONIX) 40 MG tablet TAKE 1 TABLET(40 MG) BY MOUTH DAILY   potassium chloride SA (K-DUR,KLOR-CON) 20 MEQ tablet TAKE 1  TABLET BY MOUTH DAILY AS NEEDED(DEPENDING ON LASIX USE)   rOPINIRole (REQUIP) 1 MG tablet Take 1 tablet (1 mg total) by mouth at bedtime.   simvastatin (ZOCOR) 40 MG tablet Take 1 tablet (40 mg total) by mouth at bedtime.   Spacer/Aero-Holding Chambers (POCKET SPACER) DEVI 1 each by Does not apply route as needed.   topiramate (TOPAMAX) 25 MG tablet Take 1 tablet (25 mg total) by mouth at bedtime.   traMADol (ULTRAM) 50 MG tablet Take 1 tablet (50 mg total) by mouth 3 (three) times daily as needed.   zolpidem (AMBIEN) 10 MG tablet TAKE 1 TABLET(10 MG) BY MOUTH AT BEDTIME AS NEEDED FOR SLEEP   [EXPIRED] predniSONE (DELTASONE) 20 MG tablet Take 2 tablets (40 mg total) by mouth daily with breakfast for 5 days.   [DISCONTINUED] levothyroxine (SYNTHROID) 100 MCG tablet Take 1 tablet (100 mcg total) by mouth daily before breakfast.   No facility-administered encounter medications on file as of 08/29/2021.    Allergies (verified) Bee venom, Accupril [quinapril hcl], Quinapril, Quinapril-hydrochlorothiazide, and Rosuvastatin   History: Past Medical History:  Diagnosis Date   Abnormal nuclear stress test 08/23/2015   Anemia    Ankle fracture    Arthritis    Asthma    Back pain    Chicken pox as a child   Edema of both lower extremities    Essential hypertension 07/17/2011   ACEI d/c 06/2011 for refractory cough> resolved    GERD (gastroesophageal reflux disease)    Hyperlipidemia    Hypertension    Hypothyroidism    Insomnia 11/01/2014   Joint pain    Low back pain radiating to right leg    intermittent and occasional numbness of skin over right hip in certain positions   Measles as a child   Mumps as a child   Obesity    Osteoarthritis    Palpitation    Pneumonia    this October from which she has said inhailer   Recurrent epistaxis 02/20/2012   RLS (restless legs syndrome) 11/01/2014   Skin cancer    Sleep apnea    SOB (shortness of breath) on exertion    Spinal stenosis     Vitamin B 12 deficiency    Vitamin D deficiency    Past Surgical History:  Procedure Laterality Date   CARDIAC CATHETERIZATION N/A 08/23/2015   Procedure: Left Heart Cath and Coronary Angiography;  Surgeon: [name], MD;  Location: [facility];  Service: Cardiovascular;  Laterality: N/A;   CESAREAN SECTION     X 3   GASTRECTOMY  12/2017   INGUINAL HERNIA REPAIR Right 71 yrs old   THYROIDECTOMY     total for benign tumor, Parathyroid spared   TONSILLECTOMY     TOTAL HIP ARTHROPLASTY Right 2006   secondary to congenital  hip defect   Family History  Problem Relation Age of Onset   Cancer Mother    Heart failure Mother    Lung cancer Mother 2       smoker  Heart Problems Mother        tachycardia   Hearing loss Mother    Anxiety disorder Mother        anxiety, claustrophobia   Obesity Mother    Osteoarthritis Father    Hyperlipidemia Father    Anuerysm Father        AAA   Sudden death Father    Hyperlipidemia Sister    Migraines Sister    Hypertension Sister    Heart Problems Sister        tachycardia   Colon cancer Maternal Grandmother    Heart attack Maternal Grandfather    Aneurysm Paternal Grandfather        abdominal   Heart disease Paternal Grandfather        AAA rupture, smoker   Social History   Socioeconomic History   Marital status: Married    Spouse name: Not on file   Number of children: 3   Years of education: Not on file   Highest education level: Not on file  Occupational History   Occupation: Training and development officer   Occupation: Primary school teacher   Occupation: Retired Adult nurse  Tobacco Use   Smoking status: Former    Packs/day: 0.50    Years: 5.00    Pack years: 2.50    Types: Cigarettes    Quit date: 07/25/1971    Years since quitting: 50.1   Smokeless tobacco: Never  Vaping Use   Vaping Use: Never used  Substance and Sexual Activity   Alcohol use: Yes    Comment: OCC   Drug use: No   Sexual activity: Never    Comment: lives with husband, 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  Other Topics Concern   Not on file  Social History Narrative   Not on file   Social Determinants of Health   Financial Resource Strain: Low Risk    Difficulty of Paying Living Expenses: Not hard at all  Food Insecurity: No Food Insecurity   Worried About Charity fundraiser in the Last Year: Never true   Arboriculturist in the Last Year: Never true  Transportation Needs: No Transportation Needs   Lack of Transportation (Medical): No   Lack of Transportation (Non-Medical): No  Physical Activity: Inactive   Days of Exercise per Week: 0 days   Minutes of Exercise per Session: 0 min  Stress: No Stress Concern Present   Feeling of Stress : Not at all  Social Connections: Socially Integrated   Frequency of Communication with Friends and Family: Twice a week   Frequency of Social Gatherings with Friends and Family: More than three times a week   Attends Religious Services: More than 4 times per year   Active Member of [employer] or Organizations: Yes   Attends Music therapist: More than 4 times per year   Marital Status: Married    Tobacco Counseling Counseling given: Not Answered   Clinical Intake:  Pre-visit preparation completed: Yes  Pain : No/denies pain (pt has chronic pain -nothing new today)     BMI - recorded: 47.35 Nutritional Status: BMI > 30  Obese Nutritional Risks: None Diabetes: No  How often do you need to have someone help you when you read instructions, pamphlets, or other written materials from your doctor or pharmacy?: 1 - Never  Diabetic?No  Interpreter Needed?: No  Information entered by :: Caroleen Hamman LPN   Activities of Daily Living In your present state of health, do you have any difficulty performing  the following activities: 08/29/2021 02/28/2021  Hearing? N N  Vision? N N  Difficulty concentrating or making decisions? N N  Walking or climbing stairs? N N  Dressing or bathing? N N  Doing errands,  shopping? N N  Preparing Food and eating ? N -  Using the Toilet? N -  In the past six months, have you accidently leaked urine? Y -  Do you have problems with loss of bowel control? N -  Managing your Medications? N -  Managing your Finances? N -  Housekeeping or managing your Housekeeping? N -  Some recent data might be hidden    Patient Care Team: Mosie Lukes, MD as PCP - General (Family Medicine)  Indicate any recent Medical Services you may have received from other than [facility] providers in the past year (date may be approximate).     Assessment:   This is a routine wellness examination for Rebecca Brooks.  Hearing/Vision screen Hearing Screening - Comments:: No issues Vision Screening - Comments:: Last eye exam-08/2020  Dietary issues and exercise activities discussed: Current Exercise Habits: The patient does not participate in regular exercise at present, Exercise limited by: None identified   Goals Addressed             This Visit's Progress    Patient Stated       Drink more water & lose some weight       Depression Screen PHQ 2/9 Scores 08/29/2021 01/26/2021 02/16/2020 07/06/2015 06/12/2013  PHQ - 2 Score 0 3 0 0 0  PHQ- 9 Score - 10 0 - -    Fall Risk Fall Risk  08/29/2021 02/28/2021 02/16/2020 07/06/2015 06/12/2013  Falls in the past year? 1 0 1 No No  Number falls in past yr: 0 0 0 - -  Injury with Fall? 0 0 0 - -  Risk for fall due to : History of fall(s) - - - -  Follow up Falls prevention discussed - - - -    FALL RISK PREVENTION PERTAINING TO THE HOME:  Any stairs in or around the home? Yes  If so, are there any without handrails? No  Home free of loose throw rugs in walkways, pet beds, electrical cords, etc? Yes  Adequate lighting in your home to reduce risk of falls? Yes   ASSISTIVE DEVICES UTILIZED TO PREVENT FALLS:  Life alert? No  Use of a cane, walker or w/c? Yes cane Grab bars in the bathroom? Yes  Shower chair or bench in shower? No  Elevated  toilet seat or a handicapped toilet? Yes elevated toilets  TIMED UP AND GO:  Was the test performed? No . Phone visit   Cognitive Function:Normal cognitive status assessed by  this Nurse Health Advisor. No abnormalities found.          Immunizations Immunization History  Administered Date(s) Administered   Fluad Quad(high Dose 65+) 04/19/2019   Influenza Split 07/19/2012   Influenza Whole 06/24/2011, 05/13/2013   Influenza, High Dose Seasonal PF 06/22/2017, 05/14/2018   Influenza,inj,Quad PF,6+ Mos 06/03/2016   Influenza-Unspecified 05/07/2014, 06/08/2015   PFIZER(Purple Top)SARS-COV-2 Vaccination 08/12/2019, 09/03/2019, 06/05/2020   Pneumococcal Conjugate-13 05/14/2018   Pneumococcal Polysaccharide-23 07/18/2011   Td 07/24/2005   Tdap 08/01/2011   Zoster, Live 07/23/2012    TDAP status: Due, Education has been provided regarding the importance of this vaccine. Advised may receive this vaccine at local pharmacy or Health Dept. Aware to provide a copy of the vaccination record if obtained from local  pharmacy or Health Dept. Verbalized acceptance and understanding.  Flu Vaccine status: Up to date  Pneumococcal vaccine status: Up to date  Covid-19 vaccine status: Information provided on how to obtain vaccines.   Qualifies for Shingles Vaccine? Yes   Zostavax completed Yes   Shingrix Completed?: No.    Education has been provided regarding the importance of this vaccine. Patient has been advised to call insurance company to determine out of pocket expense if they have not yet received this vaccine. Advised may also receive vaccine at local pharmacy or Health Dept. Verbalized acceptance and understanding.  Screening Tests Health Maintenance  Topic Date Due   Hepatitis C Screening  Never done   Zoster Vaccines- Shingrix (1 of 2) Never done   COLONOSCOPY (Pts 45-90yrs Insurance coverage will need to be confirmed)  07/25/2015   Pneumonia Vaccine 66+ Years old (3 - PPSV23 if  available, else PCV20) 05/15/2019   COVID-19 Vaccine (4 - Booster for Pfizer series) 07/31/2020   INFLUENZA VACCINE  02/21/2021   TETANUS/TDAP  07/31/2021   MAMMOGRAM  04/20/2022   DEXA SCAN  Completed   HPV VACCINES  Aged Out    Health Maintenance  Health Maintenance Due  Topic Date Due   Hepatitis C Screening  Never done   Zoster Vaccines- Shingrix (1 of 2) Never done   COLONOSCOPY (Pts 45-37yrs Insurance coverage will need to be confirmed)  07/25/2015   Pneumonia Vaccine 76+ Years old (3 - PPSV23 if available, else PCV20) 05/15/2019   COVID-19 Vaccine (4 - Booster for Pfizer series) 07/31/2020   INFLUENZA VACCINE  02/21/2021   TETANUS/TDAP  07/31/2021    Colorectal cancer screening: Type of screening: Cologuard. Completed 03/08/2020. Repeat every 3 years  Mammogram status: Patient states she will call GYN to schedule.  Bone Density status: Declined  Lung Cancer Screening: (Low Dose CT Chest recommended if Age 7-80 years, 30 pack-year currently smoking OR have quit w/in 15years.) does not qualify.     Additional Screening:  Hepatitis C Screening: does qualify; Discuss with PCP  Vision Screening: Recommended annual ophthalmology exams for early detection of glaucoma and other disorders of the eye. Is the patient up to date with their annual eye exam?  Yes  Who is the provider or what is the name of the office in which the patient attends annual eye exams? Unsure of name   Dental Screening: Recommended annual dental exams for proper oral hygiene  Community Resource Referral / Chronic Care Management: CRR required this visit?  No   CCM required this visit?  No      Plan:     I have personally reviewed and noted the following in the patients chart:   Medical and social history Use of alcohol, tobacco or illicit drugs  Current medications and supplements including opioid prescriptions. Patient is not currently taking opioid prescriptions. Functional ability and  status Nutritional status Physical activity Advanced directives List of other physicians Hospitalizations, surgeries, and ER visits in previous 12 months Vitals Screenings to include cognitive, depression, and falls Referrals and appointments  In addition, I have reviewed and discussed with patient certain preventive protocols, quality metrics, and best practice recommendations. A written personalized care plan for preventive services as well as general preventive health recommendations were provided to patient.   Due to this being a telephonic visit, the after visit summary with patients personalized plan was offered to patient via mail or my-chart.  Patient would like to access on my-chart.   Jeanie Cooks  [place], [place]   01/29/7281  Nurse Health Advisor  Nurse Notes: None

## 2021-08-28 ENCOUNTER — Other Ambulatory Visit: Payer: Self-pay | Admitting: Family Medicine

## 2021-08-29 ENCOUNTER — Ambulatory Visit (INDEPENDENT_AMBULATORY_CARE_PROVIDER_SITE_OTHER): Payer: Medicare Other

## 2021-08-29 ENCOUNTER — Other Ambulatory Visit: Payer: Self-pay | Admitting: Family Medicine

## 2021-08-29 VITALS — Ht 62.25 in | Wt 261.0 lb

## 2021-08-29 DIAGNOSIS — Z0000 Encounter for general adult medical examination without abnormal findings: Secondary | ICD-10-CM

## 2021-08-29 NOTE — Patient Instructions (Signed)
Ms. Rebecca Brooks , Thank you for taking time to complete your Medicare Wellness Visit. I appreciate your ongoing commitment to your health goals. Please review the following plan we discussed and let me know if I can assist you in the future.   Screening recommendations/referrals: Colonoscopy: Cologuard completed 03/08/2020-Due 03/09/2023 Mammogram: Per our conversation, you will schedule with GYN. Bone Density: Declined today. Recommended yearly ophthalmology/optometry visit for glaucoma screening and checkup Recommended yearly dental visit for hygiene and checkup  Vaccinations: Influenza vaccine: Up to date Pneumococcal vaccine: Up to date-specific date unknown. Tdap vaccine: Due-May obtain vaccine at  your local pharmacy. Shingles vaccine: Due-May obtain vaccine at your local pharmacy. Covid-19:Booster available at the pharmacy.  Advanced directives: Please bring a copy of Living Will and/or Healthcare Power of Attorney for your chart.   Conditions/risks identified: See problem list  Next appointment: Follow up in one year for your annual wellness visit    Preventive Care 65 Years and Older, Female Preventive care refers to lifestyle choices and visits with your health care provider that can promote health and wellness. What does preventive care include? A yearly physical exam. This is also called an annual well check. Dental exams once or twice a year. Routine eye exams. Ask your health care provider how often you should have your eyes checked. Personal lifestyle choices, including: Daily care of your teeth and gums. Regular physical activity. Eating a healthy diet. Avoiding tobacco and drug use. Limiting alcohol use. Practicing safe sex. Taking low-dose aspirin every day. Taking vitamin and mineral supplements as recommended by your health care provider. What happens during an annual well check? The services and screenings done by your health care provider during your annual  well check will depend on your age, overall health, lifestyle risk factors, and family history of disease. Counseling  Your health care provider may ask you questions about your: Alcohol use. Tobacco use. Drug use. Emotional well-being. Home and relationship well-being. Sexual activity. Eating habits. History of falls. Memory and ability to understand (cognition). Work and work Statistician. Reproductive health. Screening  You may have the following tests or measurements: Height, weight, and BMI. Blood pressure. Lipid and cholesterol levels. These may be checked every 5 years, or more frequently if you are over 45 years old. Skin check. Lung cancer screening. You may have this screening every year starting at age 41 if you have a 30-pack-year history of smoking and currently smoke or have quit within the past 15 years. Fecal occult blood test (FOBT) of the stool. You may have this test every year starting at age 49. Flexible sigmoidoscopy or colonoscopy. You may have a sigmoidoscopy every 5 years or a colonoscopy every 10 years starting at age 32. Hepatitis C blood test. Hepatitis B blood test. Sexually transmitted disease (STD) testing. Diabetes screening. This is done by checking your blood sugar (glucose) after you have not eaten for a while (fasting). You may have this done every 1-3 years. Bone density scan. This is done to screen for osteoporosis. You may have this done starting at age 23. Mammogram. This may be done every 1-2 years. Talk to your health care provider about how often you should have regular mammograms. Talk with your health care provider about your test results, treatment options, and if necessary, the need for more tests. Vaccines  Your health care provider may recommend certain vaccines, such as: Influenza vaccine. This is recommended every year. Tetanus, diphtheria, and acellular pertussis (Tdap, Td) vaccine. You may need a Td  booster every 10 years. Zoster  vaccine. You may need this after age 35. Pneumococcal 13-valent conjugate (PCV13) vaccine. One dose is recommended after age 29. Pneumococcal polysaccharide (PPSV23) vaccine. One dose is recommended after age 44. Talk to your health care provider about which screenings and vaccines you need and how often you need them. This information is not intended to replace advice given to you by your health care provider. Make sure you discuss any questions you have with your health care provider. Document Released: 08/06/2015 Document Revised: 03/29/2016 Document Reviewed: 05/11/2015 Elsevier Interactive Patient Education  2017 [place] Prevention in the Home Falls can cause injuries. They can happen to people of all ages. There are many things you can do to make your home safe and to help prevent falls. What can I do on the outside of my home? Regularly fix the edges of walkways and driveways and fix any cracks. Remove anything that might make you trip as you walk through a door, such as a raised step or threshold. Trim any bushes or trees on the path to your home. Use bright outdoor lighting. Clear any walking paths of anything that might make someone trip, such as rocks or tools. Regularly check to see if handrails are loose or broken. Make sure that both sides of any steps have handrails. Any raised decks and porches should have guardrails on the edges. Have any leaves, snow, or ice cleared regularly. Use sand or salt on walking paths during winter. Clean up any spills in your garage right away. This includes oil or grease spills. What can I do in the bathroom? Use night lights. Install grab bars by the toilet and in the tub and shower. Do not use towel bars as grab bars. Use non-skid mats or decals in the tub or shower. If you need to sit down in the shower, use a plastic, non-slip stool. Keep the floor dry. Clean up any water that spills on the floor as soon as it happens. Remove  soap buildup in the tub or shower regularly. Attach bath mats securely with double-sided non-slip rug tape. Do not have throw rugs and other things on the floor that can make you trip. What can I do in the bedroom? Use night lights. Make sure that you have a light by your bed that is easy to reach. Do not use any sheets or blankets that are too big for your bed. They should not hang down onto the floor. Have a firm chair that has side arms. You can use this for support while you get dressed. Do not have throw rugs and other things on the floor that can make you trip. What can I do in the kitchen? Clean up any spills right away. Avoid walking on wet floors. Keep items that you use a lot in easy-to-reach places. If you need to reach something above you, use a strong step stool that has a grab bar. Keep electrical cords out of the way. Do not use floor polish or wax that makes floors slippery. If you must use wax, use non-skid floor wax. Do not have throw rugs and other things on the floor that can make you trip. What can I do with my stairs? Do not leave any items on the stairs. Make sure that there are handrails on both sides of the stairs and use them. Fix handrails that are broken or loose. Make sure that handrails are as long as the stairways. Check any carpeting  to make sure that it is firmly attached to the stairs. Fix any carpet that is loose or worn. Avoid having throw rugs at the top or bottom of the stairs. If you do have throw rugs, attach them to the floor with carpet tape. Make sure that you have a light switch at the top of the stairs and the bottom of the stairs. If you do not have them, ask someone to add them for you. What else can I do to help prevent falls? Wear shoes that: Do not have high heels. Have rubber bottoms. Are comfortable and fit you well. Are closed at the toe. Do not wear sandals. If you use a stepladder: Make sure that it is fully opened. Do not climb a  closed stepladder. Make sure that both sides of the stepladder are locked into place. Ask someone to hold it for you, if possible. Clearly mark and make sure that you can see: Any grab bars or handrails. First and last steps. Where the edge of each step is. Use tools that help you move around (mobility aids) if they are needed. These include: Canes. Walkers. Scooters. Crutches. Turn on the lights when you go into a dark area. Replace any light bulbs as soon as they burn out. Set up your furniture so you have a clear path. Avoid moving your furniture around. If any of your floors are uneven, fix them. If there are any pets around you, be aware of where they are. Review your medicines with your doctor. Some medicines can make you feel dizzy. This can increase your chance of falling. Ask your doctor what other things that you can do to help prevent falls. This information is not intended to replace advice given to you by your health care provider. Make sure you discuss any questions you have with your health care provider. Document Released: 05/06/2009 Document Revised: 12/16/2015 Document Reviewed: 08/14/2014 Elsevier Interactive Patient Education  2017 [employer].

## 2021-09-02 ENCOUNTER — Encounter: Payer: Self-pay | Admitting: Family Medicine

## 2021-09-02 ENCOUNTER — Other Ambulatory Visit: Payer: Self-pay | Admitting: Family Medicine

## 2021-09-02 DIAGNOSIS — M25511 Pain in right shoulder: Secondary | ICD-10-CM | POA: Diagnosis not present

## 2021-09-02 MED ORDER — ACYCLOVIR 800 MG PO TABS: 800.0000 mg | ORAL_TABLET | Freq: Every day | ORAL | 0 refills | Status: AC

## 2021-09-07 ENCOUNTER — Other Ambulatory Visit: Payer: Self-pay | Admitting: Family Medicine

## 2021-09-12 ENCOUNTER — Encounter: Payer: Self-pay | Admitting: Family Medicine

## 2021-09-13 DIAGNOSIS — M545 Low back pain, unspecified: Secondary | ICD-10-CM | POA: Diagnosis not present

## 2021-09-14 MED ORDER — CLOTRIMAZOLE-BETAMETHASONE 1-0.05 % EX CREA: 1.0000 "application " | TOPICAL_CREAM | Freq: Every day | CUTANEOUS | 0 refills | Status: AC

## 2021-09-15 DIAGNOSIS — M545 Low back pain, unspecified: Secondary | ICD-10-CM | POA: Diagnosis not present

## 2021-09-19 ENCOUNTER — Other Ambulatory Visit: Payer: Self-pay | Admitting: Family Medicine

## 2021-09-24 ENCOUNTER — Other Ambulatory Visit: Payer: Self-pay | Admitting: Family Medicine

## 2021-09-27 ENCOUNTER — Other Ambulatory Visit: Payer: Self-pay | Admitting: Family Medicine

## 2021-09-27 ENCOUNTER — Ambulatory Visit: Payer: Medicare Other | Admitting: Medical

## 2021-09-29 ENCOUNTER — Encounter: Payer: Self-pay | Admitting: Family Medicine

## 2021-09-29 ENCOUNTER — Telehealth (INDEPENDENT_AMBULATORY_CARE_PROVIDER_SITE_OTHER): Payer: Medicare Other | Admitting: Family Medicine

## 2021-09-29 DIAGNOSIS — J0140 Acute pansinusitis, unspecified: Secondary | ICD-10-CM | POA: Diagnosis not present

## 2021-09-29 MED ORDER — DOXYCYCLINE HYCLATE 100 MG PO TABS: 100.0000 mg | ORAL_TABLET | Freq: Two times a day (BID) | ORAL | 0 refills | Status: AC

## 2021-09-29 NOTE — Progress Notes (Signed)
Virtual Video Visit via MyChart Note converted to telephone d/t lack of audio on patient end  ? ?I connected with  Rebecca Brooks on 09/29/21 at  3:40 PM EST by the video enabled telemedicine application for MyChart, and verified that I am speaking with the correct person using two identifiers. ?  ?I introduced myself as a Designer, jewellery with the practice. We discussed the limitations of evaluation and management by telemedicine and the availability of in person appointments. The patient expressed understanding and agreed to proceed. ? ?Participating parties in this visit include: The patient and the nurse practitioner listed.  ?The patient is: At home ?I am: In the office - [facility] ? ?Subjective:   ? ?CC: sinusitis ? ? ?HPI: Rebecca Brooks is a 71 y.o. year old female presenting today via [place] today for sinusitis. ? ? ?Patient reports she has been feeling poorly for the past 7 to 8 days, however the past 3 to 4 days have been significantly worse.  States she is feeling miserable with fatigue, chills, cough, sore throat, nasal congestion, bilateral ear pain/pressure, 7/10 sinus pressure radiating into her teeth, mild dyspnea with exertion.  States she is having a lot of thick green mucus and sputum.  She denies any fevers, body aches, loss of taste/smell, chest pain, wheezing.  She had a negative COVID test today.  States she has gotten minimal relief with warm compresses, benzonatate, Sudafed.  She did use her albuterol once yesterday but has not needed it today. ? ? ? ?Past medical history, Surgical history, Family history not pertinant except as noted below, Social history, Allergies, and medications have been entered into the medical record, reviewed, and corrections made.  ? ?Review of Systems:  ?All review of systems negative except what is listed in the HPI ? ? ?Objective:   ? ?General:  ?Speaking clearly in complete sentences. ?Absent shortness of  breath noted.   ?Alert and oriented x3.   ?Normal judgment.  ?Absent acute distress. ? ? ?Impression and Recommendations:   ? ?1. Acute non-recurrent pansinusitis ?- doxycycline (VIBRA-TABS) 100 MG tablet; Take 1 tablet (100 mg total) by mouth 2 (two) times daily for 10 days.  Dispense: 20 tablet; Refill: 0 ? ?Given severity of symptoms and duration heading into the weekend, start doxycycline (recently on Augmentin). Prefer to hold off on steroids for now since she just had some in January - can reassess if not improving with antibiotics. Continue Mucinex, Flonase, Zyrtec. Continue supportive measures including rest, hydration, humidifier use, steam showers, warm compresses to sinuses, warm liquids with lemon and honey, and over-the-counter cough, cold, and analgesics as needed.  Patient aware of signs/symptoms requiring further/urgent evaluation.  ? ? ?Follow-up if symptoms worsen or fail to improve.  ?  ?I discussed the assessment and treatment plan with the patient. The patient was provided an opportunity to ask questions and all were answered. The patient agreed with the plan and demonstrated an understanding of the instructions. ?  ?The patient was advised to call back or seek an in-person evaluation if the symptoms worsen or if the condition fails to improve as anticipated. ? ?I spent 20 minutes dedicated to the care of this patient on the date of this encounter to include pre-visit chart review of prior notes and results, face-to-face time with the patient, and post-visit ordering of testing as indicated.  ? ?Terrilyn Saver, NP  ? ?

## 2021-10-04 DIAGNOSIS — H5211 Myopia, right eye: Secondary | ICD-10-CM | POA: Diagnosis not present

## 2021-10-10 ENCOUNTER — Other Ambulatory Visit: Payer: Self-pay | Admitting: Family Medicine

## 2021-10-10 MED ORDER — TRAMADOL HCL 50 MG PO TABS: 50.0000 mg | ORAL_TABLET | Freq: Three times a day (TID) | ORAL | 0 refills | Status: DC | PRN

## 2021-10-10 NOTE — Telephone Encounter (Signed)
Requesting: tramadol ?Contract:  ?UDS: 05/14/18 ?Last Visit:02/16/20 (she has had acute visits, last visit was 09/29/21) ?Next Visit: none (will send message for her to schedule) ?Last Refill: 08/15/21 ? ?Please Advise ? ?

## 2021-10-12 ENCOUNTER — Encounter: Payer: Self-pay | Admitting: Family Medicine

## 2021-10-12 DIAGNOSIS — M48062 Spinal stenosis, lumbar region with neurogenic claudication: Secondary | ICD-10-CM | POA: Diagnosis not present

## 2021-10-13 ENCOUNTER — Other Ambulatory Visit: Payer: Self-pay | Admitting: Neurosurgery

## 2021-10-20 ENCOUNTER — Encounter (HOSPITAL_COMMUNITY): Payer: Self-pay | Admitting: Neurosurgery

## 2021-10-20 ENCOUNTER — Other Ambulatory Visit: Payer: Self-pay

## 2021-10-20 NOTE — Pre-Procedure Instructions (Signed)
SDW CALL ? ?Patient was given pre-op instructions over the phone. The opportunity was given for the patient to ask questions. No further questions asked. Patient verbalized understanding of instructions given. ? ? ?PCP - Gwyneth Revels ?Cardiologist - denies- pt reports normal cardiac workup in 2017 and was told that she did not need to follow up- Pt reports issues were GERD related. Pt reports she can feel palpitations 1-2 times a year, and has been having these since before her cardiac workups in 2017.  ? ?PPM/ICD - denies ?Chest x-ray - 01/05/21 ?EKG - 01/26/21 ?Stress Test - 08/18/15 ?ECHO - 12/06/16 ?Cardiac Cath - 08/23/15 ? ?Sleep Study - 02/27/18 ?CPAP - yes, does not know settings ? ?Aspirin Instructions: last dose 10/12/21 per surgeon ? ?COVID TEST- N/A- pt denies symptoms ? ? ?Anesthesia review: yes, review cardiac tests- pt reports she has had history of "violent vomiting after surgery". Pt also reports a "bad experience" with surgery in the past involving mask being placed tightly/"improperly" on her face, but her not being able to breathe. Pt states she will talk to anesthesiologist about these things in the AM.  ? ?Pt recently treated with antibiotics for "pansinusitis"- pt states this has resolved and she is not having any symptoms currently.  ? ?Patient denies shortness of breath, fever, cough and chest pain over the phone call ? ? ? ?Surgical Instructions ? ? ? Your procedure is scheduled on Friday March 31 ? Report to [facility] Main Entrance "A" at 11:30 A.M., then check in with the Admitting office. ? Call this number if you have problems the morning of surgery: ? [phone] ? ? ? Remember: ? Do not eat or drink after midnight the night before your surgery ? ? ? Take these medicines the morning of surgery with A SIP OF WATER:  ?acetaminophen (TYLENOL) ?cetirizine (ZYRTEC)  ?levothyroxine (SYNTHROID)  ?metoprolol tartrate (LOPRESSOR)  ?pantoprazole (PROTONIX)  ?traMADol Veatrice Bourbon)  if needed ?albuterol  (VENTOLIN HFA) 108 (90 Base) MCG/ACT inhaler if needed ?Please bring all inhalers with you the day of surgery.  ? ?Follow your surgeon's instructions on when to stop Aspirin.  If no instructions were given by your surgeon then you will need to call the office to get those instructions.   ? ?As of today, STOP taking any Aleve, Naproxen, Ibuprofen, Motrin, Advil, Goody's, BC's, all herbal medications, fish oil, and all vitamins, diclofenac sodium (VOLTAREN)  ? ?[facility] is not responsible for any belongings or valuables.  ? ?If you use a CPAP at night, you may bring your mask for your overnight stay. ?  ?Contacts, glasses, hearing aids, dentures or partials may not be worn into surgery, please bring cases for these belongings ?  ?Patients discharged the day of surgery will not be allowed to drive home, and someone needs to stay with them for 24 hours. ? ? ?SURGICAL WAITING ROOM VISITATION ?No visitors are allowed in pre-op area with patient.  ?Patients having surgery or a procedure in a hospital may have two support people in the waiting room. ?Children under the age of 28 must have an adult with them who is not the patient. ?They may stay in the waiting area during the procedure and may switch out with other visitors. If the patient needs to stay at the hospital during part of their recovery, the visitor guidelines for inpatient rooms apply. ? ?Please refer to the [facility] website for the visitor guidelines for Inpatients (after your surgery is over and you are in  a regular room).  ? ? ? ?Special instructions:   ? ?Oral Hygiene is also important to reduce your risk of infection.  Remember - BRUSH YOUR TEETH THE MORNING OF SURGERY WITH YOUR REGULAR TOOTHPASTE ? ? ?Day of Surgery: ? ?Take a shower the day of or night before with antibacterial soap. ?Wear Clean/Comfortable clothing the morning of surgery ?Do not apply any deodorants/lotions.   ?Do not wear jewelry or makeup ?Do not wear lotions, powders,  perfumes/colognes, or deodorant. ?Do not shave 48 hours prior to surgery.  Men may shave face and neck. ?Do not bring valuables to the hospital. ?Do not wear nail polish, gel polish, artificial nails, or any other type of covering on natural nails (fingers and toes) ?If you have artificial nails or gel coating that need to be removed by a nail salon, please have this removed prior to surgery. Artificial nails or gel coating may interfere with anesthesia's ability to adequately monitor your vital signs. ?Remember to brush your teeth WITH YOUR REGULAR TOOTHPASTE. ? ? ? ? ? ?

## 2021-10-21 ENCOUNTER — Encounter (HOSPITAL_COMMUNITY)
Admission: RE | Disposition: A | Discharge status: Home / Self Care | Payer: Self-pay | Source: Home / Self Care | Attending: Neurosurgery

## 2021-10-21 ENCOUNTER — Encounter (HOSPITAL_COMMUNITY): Payer: Self-pay | Admitting: Neurosurgery

## 2021-10-21 ENCOUNTER — Ambulatory Visit (HOSPITAL_BASED_OUTPATIENT_CLINIC_OR_DEPARTMENT_OTHER): Payer: Medicare Other | Admitting: Physician Assistant

## 2021-10-21 ENCOUNTER — Other Ambulatory Visit: Payer: Self-pay

## 2021-10-21 ENCOUNTER — Observation Stay (HOSPITAL_COMMUNITY)
Admission: RE | Admit: 2021-10-21 | Discharge: 2021-10-22 | Disposition: A | Discharge status: Home / Self Care | Payer: Medicare Other | Attending: Neurosurgery | Admitting: Neurosurgery

## 2021-10-21 ENCOUNTER — Ambulatory Visit (HOSPITAL_COMMUNITY): Payer: Medicare Other | Admitting: Physician Assistant

## 2021-10-21 ENCOUNTER — Ambulatory Visit (HOSPITAL_COMMUNITY): Payer: Medicare Other

## 2021-10-21 DIAGNOSIS — M48062 Spinal stenosis, lumbar region with neurogenic claudication: Secondary | ICD-10-CM

## 2021-10-21 DIAGNOSIS — Z87891 Personal history of nicotine dependence: Secondary | ICD-10-CM | POA: Diagnosis not present

## 2021-10-21 DIAGNOSIS — J45909 Unspecified asthma, uncomplicated: Secondary | ICD-10-CM | POA: Diagnosis not present

## 2021-10-21 DIAGNOSIS — E039 Hypothyroidism, unspecified: Secondary | ICD-10-CM

## 2021-10-21 DIAGNOSIS — G4730 Sleep apnea, unspecified: Secondary | ICD-10-CM | POA: Diagnosis not present

## 2021-10-21 DIAGNOSIS — I10 Essential (primary) hypertension: Secondary | ICD-10-CM | POA: Diagnosis not present

## 2021-10-21 DIAGNOSIS — Z7982 Long term (current) use of aspirin: Secondary | ICD-10-CM | POA: Insufficient documentation

## 2021-10-21 DIAGNOSIS — Z79899 Other long term (current) drug therapy: Secondary | ICD-10-CM | POA: Insufficient documentation

## 2021-10-21 DIAGNOSIS — Z85828 Personal history of other malignant neoplasm of skin: Secondary | ICD-10-CM | POA: Diagnosis not present

## 2021-10-21 DIAGNOSIS — Z96641 Presence of right artificial hip joint: Secondary | ICD-10-CM | POA: Insufficient documentation

## 2021-10-21 DIAGNOSIS — M5136 Other intervertebral disc degeneration, lumbar region: Secondary | ICD-10-CM | POA: Diagnosis not present

## 2021-10-21 HISTORY — DX: Insulin resistance, unspecified: E88.819

## 2021-10-21 HISTORY — PX: LUMBAR LAMINECTOMY/DECOMPRESSION MICRODISCECTOMY: SHX5026

## 2021-10-21 HISTORY — DX: Nausea with vomiting, unspecified: R11.2

## 2021-10-21 HISTORY — DX: Metabolic syndrome: E88.81

## 2021-10-21 HISTORY — DX: Other specified postprocedural states: Z98.890

## 2021-10-21 LAB — BASIC METABOLIC PANEL
Anion gap: 10 (ref 5–15)
BUN: 15 mg/dL (ref 8–23)
CO2: 26 mmol/L (ref 22–32)
Calcium: 9.3 mg/dL (ref 8.9–10.3)
Chloride: 103 mmol/L (ref 98–111)
Creatinine, Ser: 0.73 mg/dL (ref 0.44–1.00)
GFR, Estimated: >60 mL/min (ref >60)
Glucose, Bld: 99 mg/dL (ref 70–99)
Potassium: 3.8 mmol/L (ref 3.5–5.1)
Sodium: 139 mmol/L (ref 135–145)

## 2021-10-21 LAB — SURGICAL PCR SCREEN
MRSA, PCR: NEGATIVE
Staphylococcus aureus: NEGATIVE

## 2021-10-21 LAB — CBC
HCT: 44.1 % (ref 36.0–46.0)
Hemoglobin: 14.6 g/dL (ref 12.0–15.0)
MCH: 31.3 pg (ref 26.0–34.0)
MCHC: 33.1 g/dL (ref 30.0–36.0)
MCV: 94.4 fL (ref 80.0–100.0)
Platelets: 422 10*3/uL — ABNORMAL HIGH (ref 150–400)
RBC: 4.67 MIL/uL (ref 3.87–5.11)
RDW: 13.3 % (ref 11.5–15.5)
WBC: 9.2 10*3/uL (ref 4.0–10.5)
nRBC: 0 % (ref 0.0–0.2)

## 2021-10-21 SURGERY — LUMBAR LAMINECTOMY/DECOMPRESSION MICRODISCECTOMY 2 LEVELS
Anesthesia: General | Site: Back | Laterality: Bilateral

## 2021-10-21 MED ORDER — ASPIRIN EC 81 MG PO TBEC
81.0000 mg | DELAYED_RELEASE_TABLET | Freq: Every day | ORAL | Status: DC
  Administered 2021-10-21: 81 mg via ORAL
  Filled 2021-10-21: qty 1

## 2021-10-21 MED ORDER — ACETAMINOPHEN 10 MG/ML IV SOLN
1000.0000 mg | Freq: Once | INTRAVENOUS | Status: DC | PRN
  Administered 2021-10-21: 1000 mg via INTRAVENOUS

## 2021-10-21 MED ORDER — CEFAZOLIN SODIUM-DEXTROSE 1-4 GM/50ML-% IV SOLN
1.0000 g | Freq: Three times a day (TID) | INTRAVENOUS | Status: AC
  Administered 2021-10-21 – 2021-10-22 (×2): 1 g via INTRAVENOUS
  Filled 2021-10-21 (×2): qty 50

## 2021-10-21 MED ORDER — THROMBIN 20000 UNITS EX SOLR
CUTANEOUS | Status: AC
  Filled 2021-10-21: qty 20000

## 2021-10-21 MED ORDER — ALBUTEROL SULFATE (2.5 MG/3ML) 0.083% IN NEBU: 2.5000 mg | INHALATION_SOLUTION | Freq: Four times a day (QID) | RESPIRATORY_TRACT | Status: DC | PRN

## 2021-10-21 MED ORDER — ACETAMINOPHEN 650 MG RE SUPP: 650.0000 mg | RECTAL | Status: DC | PRN

## 2021-10-21 MED ORDER — HYDROCODONE-ACETAMINOPHEN 10-325 MG PO TABS
2.0000 | ORAL_TABLET | ORAL | Status: DC | PRN
  Administered 2021-10-22: 2 via ORAL
  Filled 2021-10-21: qty 2

## 2021-10-21 MED ORDER — EPHEDRINE SULFATE-NACL 50-0.9 MG/10ML-% IV SOSY
PREFILLED_SYRINGE | INTRAVENOUS | Status: DC | PRN
  Administered 2021-10-21: 5 mg via INTRAVENOUS

## 2021-10-21 MED ORDER — SODIUM CHLORIDE 0.9% FLUSH: 3.0000 mL | Freq: Two times a day (BID) | INTRAVENOUS | Status: DC

## 2021-10-21 MED ORDER — ESMOLOL HCL 100 MG/10ML IV SOLN
INTRAVENOUS | Status: DC | PRN
  Administered 2021-10-21: 30 mg via INTRAVENOUS

## 2021-10-21 MED ORDER — LORATADINE 10 MG PO TABS
10.0000 mg | ORAL_TABLET | Freq: Every day | ORAL | Status: DC
Start: 2021-10-22 — End: 2021-10-22

## 2021-10-21 MED ORDER — PROPOFOL 500 MG/50ML IV EMUL
INTRAVENOUS | Status: DC | PRN
  Administered 2021-10-21: 25 ug/kg/min via INTRAVENOUS

## 2021-10-21 MED ORDER — METOPROLOL TARTRATE 25 MG PO TABS
50.0000 mg | ORAL_TABLET | Freq: Two times a day (BID) | ORAL | Status: DC
  Administered 2021-10-21: 50 mg via ORAL
  Filled 2021-10-21: qty 2

## 2021-10-21 MED ORDER — FAMOTIDINE 20 MG PO TABS
20.0000 mg | ORAL_TABLET | Freq: Every day | ORAL | Status: DC
  Administered 2021-10-21: 20 mg via ORAL
  Filled 2021-10-21: qty 1

## 2021-10-21 MED ORDER — SIMVASTATIN 20 MG PO TABS
40.0000 mg | ORAL_TABLET | Freq: Every day | ORAL | Status: DC
  Administered 2021-10-21: 40 mg via ORAL
  Filled 2021-10-21: qty 2

## 2021-10-21 MED ORDER — ORAL CARE MOUTH RINSE: 15.0000 mL | Freq: Once | OROMUCOSAL | Status: AC

## 2021-10-21 MED ORDER — SODIUM CHLORIDE 0.9% FLUSH: 3.0000 mL | INTRAVENOUS | Status: DC | PRN

## 2021-10-21 MED ORDER — BUPIVACAINE HCL (PF) 0.25 % IJ SOLN
INTRAMUSCULAR | Status: DC | PRN
  Administered 2021-10-21: 20 mL

## 2021-10-21 MED ORDER — MEPERIDINE HCL 25 MG/ML IJ SOLN
6.2500 mg | INTRAMUSCULAR | Status: DC | PRN
  Administered 2021-10-21: 6.25 mg via INTRAVENOUS

## 2021-10-21 MED ORDER — MIDAZOLAM HCL 2 MG/2ML IJ SOLN
INTRAMUSCULAR | Status: AC
  Filled 2021-10-21: qty 2

## 2021-10-21 MED ORDER — KETOROLAC TROMETHAMINE 15 MG/ML IJ SOLN
15.0000 mg | Freq: Four times a day (QID) | INTRAMUSCULAR | Status: DC
  Administered 2021-10-21 – 2021-10-22 (×2): 15 mg via INTRAVENOUS
  Filled 2021-10-21 (×2): qty 1

## 2021-10-21 MED ORDER — ACETAMINOPHEN 10 MG/ML IV SOLN
INTRAVENOUS | Status: AC
  Filled 2021-10-21: qty 100

## 2021-10-21 MED ORDER — KETAMINE HCL 10 MG/ML IJ SOLN
INTRAMUSCULAR | Status: DC | PRN
  Administered 2021-10-21: 20 mg via INTRAVENOUS
  Administered 2021-10-21: 5 mg via INTRAVENOUS

## 2021-10-21 MED ORDER — HYDROCODONE-ACETAMINOPHEN 5-325 MG PO TABS
1.0000 | ORAL_TABLET | ORAL | Status: DC | PRN
  Administered 2021-10-22: 1 via ORAL
  Filled 2021-10-21: qty 1

## 2021-10-21 MED ORDER — GUAIFENESIN ER 600 MG PO TB12
1200.0000 mg | ORAL_TABLET | Freq: Two times a day (BID) | ORAL | Status: DC | PRN
  Filled 2021-10-21: qty 2

## 2021-10-21 MED ORDER — KETAMINE HCL 50 MG/5ML IJ SOSY
PREFILLED_SYRINGE | INTRAMUSCULAR | Status: AC
  Filled 2021-10-21: qty 5

## 2021-10-21 MED ORDER — PHENOL 1.4 % MT LIQD: 1.0000 | OROMUCOSAL | Status: DC | PRN

## 2021-10-21 MED ORDER — HYDROMORPHONE HCL 1 MG/ML IJ SOLN
INTRAMUSCULAR | Status: AC
  Filled 2021-10-21: qty 0.5

## 2021-10-21 MED ORDER — VITAMIN D 25 MCG (1000 UNIT) PO TABS: 1000.0000 [IU] | ORAL_TABLET | Freq: Every day | ORAL | Status: DC

## 2021-10-21 MED ORDER — ACETAMINOPHEN 160 MG/5ML PO SOLN: 325.0000 mg | Freq: Once | ORAL | Status: DC | PRN

## 2021-10-21 MED ORDER — ONDANSETRON HCL 4 MG/2ML IJ SOLN: 4.0000 mg | Freq: Four times a day (QID) | INTRAMUSCULAR | Status: DC | PRN

## 2021-10-21 MED ORDER — MENTHOL 3 MG MT LOZG: 1.0000 | LOZENGE | OROMUCOSAL | Status: DC | PRN

## 2021-10-21 MED ORDER — SUGAMMADEX SODIUM 500 MG/5ML IV SOLN
INTRAVENOUS | Status: AC
  Filled 2021-10-21: qty 5

## 2021-10-21 MED ORDER — AMISULPRIDE (ANTIEMETIC) 5 MG/2ML IV SOLN: 10.0000 mg | Freq: Once | INTRAVENOUS | Status: DC | PRN

## 2021-10-21 MED ORDER — CYCLOBENZAPRINE HCL 10 MG PO TABS
10.0000 mg | ORAL_TABLET | Freq: Three times a day (TID) | ORAL | Status: DC | PRN
  Administered 2021-10-21: 10 mg via ORAL
  Filled 2021-10-21: qty 1

## 2021-10-21 MED ORDER — 0.9 % SODIUM CHLORIDE (POUR BTL) OPTIME
TOPICAL | Status: DC | PRN
  Administered 2021-10-21: 1000 mL

## 2021-10-21 MED ORDER — ONDANSETRON HCL 4 MG PO TABS: 4.0000 mg | ORAL_TABLET | Freq: Four times a day (QID) | ORAL | Status: DC | PRN

## 2021-10-21 MED ORDER — MIDAZOLAM HCL 5 MG/5ML IJ SOLN
INTRAMUSCULAR | Status: DC | PRN
  Administered 2021-10-21 (×2): 1 mg via INTRAVENOUS

## 2021-10-21 MED ORDER — ONDANSETRON HCL 4 MG/2ML IJ SOLN
INTRAMUSCULAR | Status: AC
  Filled 2021-10-21: qty 4

## 2021-10-21 MED ORDER — FENTANYL CITRATE (PF) 100 MCG/2ML IJ SOLN
INTRAMUSCULAR | Status: DC | PRN
  Administered 2021-10-21: 75 ug via INTRAVENOUS
  Administered 2021-10-21: 50 ug via INTRAVENOUS

## 2021-10-21 MED ORDER — ROPINIROLE HCL 1 MG PO TABS: 1.0000 mg | ORAL_TABLET | Freq: Every day | ORAL | Status: DC

## 2021-10-21 MED ORDER — HYDROMORPHONE HCL 1 MG/ML IJ SOLN
0.2500 mg | INTRAMUSCULAR | Status: DC | PRN
  Administered 2021-10-21 (×3): 0.5 mg via INTRAVENOUS

## 2021-10-21 MED ORDER — PROPOFOL 10 MG/ML IV BOLUS
INTRAVENOUS | Status: DC | PRN
  Administered 2021-10-21: 50 mg via INTRAVENOUS
  Administered 2021-10-21: 150 mg via INTRAVENOUS

## 2021-10-21 MED ORDER — HYDROCHLOROTHIAZIDE 25 MG PO TABS: 25.0000 mg | ORAL_TABLET | Freq: Every day | ORAL | Status: DC

## 2021-10-21 MED ORDER — PHENYLEPHRINE 40 MCG/ML (10ML) SYRINGE FOR IV PUSH (FOR BLOOD PRESSURE SUPPORT)
PREFILLED_SYRINGE | INTRAVENOUS | Status: DC | PRN
Start: 2021-10-21 — End: 2021-10-21
  Administered 2021-10-21: 40 ug via INTRAVENOUS

## 2021-10-21 MED ORDER — HYDROMORPHONE HCL 1 MG/ML IJ SOLN
INTRAMUSCULAR | Status: DC | PRN
  Administered 2021-10-21 (×2): .25 mg via INTRAVENOUS

## 2021-10-21 MED ORDER — LIDOCAINE 2% (20 MG/ML) 5 ML SYRINGE
INTRAMUSCULAR | Status: DC | PRN
  Administered 2021-10-21: 40 mg via INTRAVENOUS

## 2021-10-21 MED ORDER — CHLORHEXIDINE GLUCONATE 0.12 % MT SOLN
15.0000 mL | Freq: Once | OROMUCOSAL | Status: AC
  Administered 2021-10-21: 15 mL via OROMUCOSAL
  Filled 2021-10-21: qty 15

## 2021-10-21 MED ORDER — LACTATED RINGERS IV SOLN: INTRAVENOUS | Status: DC

## 2021-10-21 MED ORDER — SUGAMMADEX SODIUM 200 MG/2ML IV SOLN
INTRAVENOUS | Status: DC | PRN
  Administered 2021-10-21: 500 mg via INTRAVENOUS

## 2021-10-21 MED ORDER — KETOROLAC TROMETHAMINE 30 MG/ML IJ SOLN
INTRAMUSCULAR | Status: DC | PRN
  Administered 2021-10-21: 30 mg via INTRAVENOUS

## 2021-10-21 MED ORDER — HYDROMORPHONE HCL 1 MG/ML IJ SOLN: 1.0000 mg | INTRAMUSCULAR | Status: DC | PRN

## 2021-10-21 MED ORDER — TRAMADOL HCL 50 MG PO TABS
50.0000 mg | ORAL_TABLET | Freq: Three times a day (TID) | ORAL | Status: DC | PRN
  Administered 2021-10-22: 50 mg via ORAL
  Filled 2021-10-21: qty 1

## 2021-10-21 MED ORDER — ACETAMINOPHEN 325 MG PO TABS: 325.0000 mg | ORAL_TABLET | Freq: Once | ORAL | Status: DC | PRN

## 2021-10-21 MED ORDER — POTASSIUM CHLORIDE CRYS ER 10 MEQ PO TBCR
10.0000 meq | EXTENDED_RELEASE_TABLET | Freq: Once | ORAL | Status: AC
  Administered 2021-10-21: 10 meq via ORAL
  Filled 2021-10-21: qty 1

## 2021-10-21 MED ORDER — THROMBIN 20000 UNITS EX SOLR
CUTANEOUS | Status: DC | PRN
  Administered 2021-10-21: 20 mL via TOPICAL

## 2021-10-21 MED ORDER — TOPIRAMATE 25 MG PO TABS
25.0000 mg | ORAL_TABLET | Freq: Every day | ORAL | Status: DC
  Filled 2021-10-21: qty 1

## 2021-10-21 MED ORDER — BUPIVACAINE HCL (PF) 0.25 % IJ SOLN
INTRAMUSCULAR | Status: AC
  Filled 2021-10-21: qty 30

## 2021-10-21 MED ORDER — ROCURONIUM BROMIDE 10 MG/ML (PF) SYRINGE
PREFILLED_SYRINGE | INTRAVENOUS | Status: DC | PRN
  Administered 2021-10-21: 10 mg via INTRAVENOUS
  Administered 2021-10-21: 50 mg via INTRAVENOUS

## 2021-10-21 MED ORDER — BENZONATATE 100 MG PO CAPS
200.0000 mg | ORAL_CAPSULE | Freq: Three times a day (TID) | ORAL | Status: DC | PRN
  Filled 2021-10-21: qty 2

## 2021-10-21 MED ORDER — HYDROMORPHONE HCL 1 MG/ML IJ SOLN
INTRAMUSCULAR | Status: AC
  Filled 2021-10-21: qty 1

## 2021-10-21 MED ORDER — SODIUM CHLORIDE 0.9 % IV SOLN: 250.0000 mL | INTRAVENOUS | Status: DC

## 2021-10-21 MED ORDER — PROPOFOL 10 MG/ML IV BOLUS
INTRAVENOUS | Status: AC
  Filled 2021-10-21: qty 20

## 2021-10-21 MED ORDER — FUROSEMIDE 20 MG PO TABS
20.0000 mg | ORAL_TABLET | Freq: Every day | ORAL | Status: DC | PRN
Start: 2021-10-21 — End: 2021-10-22

## 2021-10-21 MED ORDER — PROPOFOL 1000 MG/100ML IV EMUL
INTRAVENOUS | Status: AC
  Filled 2021-10-21: qty 100

## 2021-10-21 MED ORDER — LEVOTHYROXINE SODIUM 100 MCG PO TABS
100.0000 ug | ORAL_TABLET | Freq: Every day | ORAL | Status: DC
  Administered 2021-10-22: 100 ug via ORAL
  Filled 2021-10-21: qty 1

## 2021-10-21 MED ORDER — FENTANYL CITRATE (PF) 250 MCG/5ML IJ SOLN
INTRAMUSCULAR | Status: AC
  Filled 2021-10-21: qty 5

## 2021-10-21 MED ORDER — CHLORHEXIDINE GLUCONATE CLOTH 2 % EX PADS: 6.0000 | MEDICATED_PAD | Freq: Once | CUTANEOUS | Status: DC

## 2021-10-21 MED ORDER — ROPINIROLE HCL 1 MG PO TABS
1.0000 mg | ORAL_TABLET | Freq: Every day | ORAL | Status: DC
  Administered 2021-10-21: 1 mg via ORAL
  Filled 2021-10-21: qty 1

## 2021-10-21 MED ORDER — MEPERIDINE HCL 25 MG/ML IJ SOLN
INTRAMUSCULAR | Status: AC
  Filled 2021-10-21: qty 1

## 2021-10-21 MED ORDER — CEFAZOLIN SODIUM-DEXTROSE 2-4 GM/100ML-% IV SOLN
2.0000 g | INTRAVENOUS | Status: AC
Start: 2021-10-21 — End: 2021-10-21
  Administered 2021-10-21: 2 g via INTRAVENOUS
  Filled 2021-10-21: qty 100

## 2021-10-21 MED ORDER — PHENYLEPHRINE HCL-NACL 20-0.9 MG/250ML-% IV SOLN
INTRAVENOUS | Status: DC | PRN
  Administered 2021-10-21: 25 ug/min via INTRAVENOUS

## 2021-10-21 MED ORDER — FLUTICASONE PROPIONATE 50 MCG/ACT NA SUSP
2.0000 | Freq: Every day | NASAL | Status: DC
  Administered 2021-10-21: 2 via NASAL
  Filled 2021-10-21: qty 16

## 2021-10-21 MED ORDER — ACETAMINOPHEN 325 MG PO TABS: 650.0000 mg | ORAL_TABLET | ORAL | Status: DC | PRN

## 2021-10-21 MED ORDER — PANTOPRAZOLE SODIUM 40 MG PO TBEC: 40.0000 mg | DELAYED_RELEASE_TABLET | Freq: Every day | ORAL | Status: DC

## 2021-10-21 SURGICAL SUPPLY — 55 items
ADH SKN CLS APL DERMABOND .7 (GAUZE/BANDAGES/DRESSINGS) ×1
ADH SKN CLS LQ APL DERMABOND (GAUZE/BANDAGES/DRESSINGS) ×1
APL SKNCLS STERI-STRIP NONHPOA (GAUZE/BANDAGES/DRESSINGS) ×1
BAG COUNTER SPONGE SURGICOUNT (BAG) ×5 IMPLANT
BAG DECANTER FOR FLEXI CONT (MISCELLANEOUS) ×3 IMPLANT
BAG SPNG CNTER NS LX DISP (BAG) ×3
BAND INSRT 18 STRL LF DISP RB (MISCELLANEOUS)
BAND RUBBER #18 3X1/16 STRL (MISCELLANEOUS) ×4 IMPLANT
BENZOIN TINCTURE PRP APPL 2/3 (GAUZE/BANDAGES/DRESSINGS) ×3 IMPLANT
BLADE CLIPPER SURG (BLADE) IMPLANT
BUR CUTTER 7.0 ROUND (BURR) ×3 IMPLANT
CANISTER SUCT 3000ML PPV (MISCELLANEOUS) ×3 IMPLANT
CARTRIDGE OIL MAESTRO DRILL (MISCELLANEOUS) ×2 IMPLANT
DECANTER SPIKE VIAL GLASS SM (MISCELLANEOUS) ×2 IMPLANT
DERMABOND ADHESIVE PROPEN (GAUZE/BANDAGES/DRESSINGS) ×1
DERMABOND ADVANCED (GAUZE/BANDAGES/DRESSINGS) ×1
DERMABOND ADVANCED .7 DNX12 (GAUZE/BANDAGES/DRESSINGS) ×2 IMPLANT
DERMABOND ADVANCED .7 DNX6 (GAUZE/BANDAGES/DRESSINGS) IMPLANT
DIFFUSER DRILL AIR PNEUMATIC (MISCELLANEOUS) ×3 IMPLANT
DRAPE HALF SHEET 40X57 (DRAPES) ×2 IMPLANT
DRAPE LAPAROTOMY 100X72X124 (DRAPES) ×3 IMPLANT
DRAPE MICROSCOPE LEICA (MISCELLANEOUS) ×2 IMPLANT
DRAPE SURG 17X23 STRL (DRAPES) ×6 IMPLANT
DRSG OPSITE POSTOP 3X4 (GAUZE/BANDAGES/DRESSINGS) ×1 IMPLANT
DURAPREP 26ML APPLICATOR (WOUND CARE) ×3 IMPLANT
ELECT REM PT RETURN 9FT ADLT (ELECTROSURGICAL) ×2
ELECTRODE REM PT RTRN 9FT ADLT (ELECTROSURGICAL) ×2 IMPLANT
GAUZE 4X4 16PLY ~~LOC~~+RFID DBL (SPONGE) ×1 IMPLANT
GAUZE SPONGE 4X4 12PLY STRL (GAUZE/BANDAGES/DRESSINGS) ×3 IMPLANT
GLOVE EXAM NITRILE XL STR (GLOVE) IMPLANT
GLOVE SURG ENC MOIS LTX SZ6.5 (GLOVE) ×3 IMPLANT
GLOVE SURG LTX SZ9 (GLOVE) ×3 IMPLANT
GLOVE SURG UNDER POLY LF SZ6.5 (GLOVE) ×3 IMPLANT
GOWN STRL REUS W/ TWL LRG LVL3 (GOWN DISPOSABLE) IMPLANT
GOWN STRL REUS W/ TWL XL LVL3 (GOWN DISPOSABLE) ×2 IMPLANT
GOWN STRL REUS W/TWL 2XL LVL3 (GOWN DISPOSABLE) IMPLANT
GOWN STRL REUS W/TWL LRG LVL3 (GOWN DISPOSABLE)
GOWN STRL REUS W/TWL XL LVL3 (GOWN DISPOSABLE) ×2
KIT BASIN OR (CUSTOM PROCEDURE TRAY) ×3 IMPLANT
KIT TURNOVER KIT B (KITS) ×3 IMPLANT
NDL SPNL 22GX3.5 QUINCKE BK (NEEDLE) IMPLANT
NEEDLE HYPO 22GX1.5 SAFETY (NEEDLE) ×3 IMPLANT
NEEDLE SPNL 22GX3.5 QUINCKE BK (NEEDLE) IMPLANT
NS IRRIG 1000ML POUR BTL (IV SOLUTION) ×3 IMPLANT
OIL CARTRIDGE MAESTRO DRILL (MISCELLANEOUS) ×2
PACK LAMINECTOMY NEURO (CUSTOM PROCEDURE TRAY) ×3 IMPLANT
PAD ARMBOARD 7.5X6 YLW CONV (MISCELLANEOUS) ×9 IMPLANT
SPONGE SURGIFOAM ABS GEL 100 (HEMOSTASIS) ×1 IMPLANT
SPONGE SURGIFOAM ABS GEL SZ50 (HEMOSTASIS) ×2 IMPLANT
STRIP CLOSURE SKIN 1/2X4 (GAUZE/BANDAGES/DRESSINGS) ×3 IMPLANT
SUT VIC AB 2-0 CT1 18 (SUTURE) ×3 IMPLANT
SUT VIC AB 3-0 SH 8-18 (SUTURE) ×3 IMPLANT
TOWEL GREEN STERILE (TOWEL DISPOSABLE) ×3 IMPLANT
TOWEL GREEN STERILE FF (TOWEL DISPOSABLE) ×3 IMPLANT
WATER STERILE IRR 1000ML POUR (IV SOLUTION) ×3 IMPLANT

## 2021-10-21 NOTE — Anesthesia Preprocedure Evaluation (Signed)
Anesthesia Evaluation  ?Patient identified by MRN, date of birth, ID band ?Patient awake ? ? ? ?Reviewed: ?Allergy & Precautions, NPO status , Patient's Chart, lab work & pertinent test results ? ?History of Anesthesia Complications ?(+) PONV and history of anesthetic complications ? ?Airway ?Mallampati: II ? ?TM Distance: >3 FB ?Neck ROM: Full ? ? ? Dental ? ?(+) Teeth Intact, Dental Advisory Given ?  ?Pulmonary ?asthma , sleep apnea , former smoker,  ?  ?breath sounds clear to auscultation ? ? ? ? ? ? Cardiovascular ?hypertension,  ?Rhythm:Regular Rate:Normal ? ? ?  ?Neuro/Psych ?PSYCHIATRIC DISORDERS Depression negative neurological ROS ?   ? GI/Hepatic ?Neg liver ROS, GERD  ,  ?Endo/Other  ?Hypothyroidism  ? Renal/GU ?negative Renal ROS  ? ?  ?Musculoskeletal ? ?(+) Arthritis ,  ? Abdominal ?(+) + obese,   ?Peds ? Hematology ?  ?Anesthesia Other Findings ? ? Reproductive/Obstetrics ? ?  ? ? ? ? ? ? ? ? ? ? ? ? ? ?  ?  ? ? ? ? ? ? ? ?Anesthesia Physical ?Anesthesia Plan ? ?ASA: 3 ? ?Anesthesia Plan: General  ? ?Post-op Pain Management:   ? ?Induction: Intravenous ? ?PONV Risk Score and Plan: 4 or greater and Ondansetron, Midazolam, TIVA, Amisulpride, Treatment may vary due to age or medical condition and Dexamethasone ? ?Airway Management Planned: Oral ETT ? ?Additional Equipment: None ? ?Intra-op Plan:  ? ?Post-operative Plan: Extubation in OR ? ?Informed Consent: I have reviewed the patients History and Physical, chart, labs and discussed the procedure including the risks, benefits and alternatives for the proposed anesthesia with the patient or authorized representative who has indicated his/her understanding and acceptance.  ? ? ? ?Dental advisory given ? ?Plan Discussed with: CRNA ? ?Anesthesia Plan Comments:   ? ? ? ? ? ?Anesthesia Quick Evaluation ? ?

## 2021-10-21 NOTE — Brief Op Note (Signed)
10/21/2021 ? ?6:12 PM ? ?PATIENT:  Rebecca Brooks  71 y.o. female ? ?PRE-OPERATIVE DIAGNOSIS:  Stenosis ? ?POST-OPERATIVE DIAGNOSIS:  Stenosis ? ?PROCEDURE:  Procedure(s): ?Laminectomy and Foraminotomy - bilateral - Lumbar Three-Lumbar Four - Lumbar Four- Lumbar Five (Bilateral) ? ?SURGEON:  Surgeon(s) and Role: ?   Earnie Larsson, MD - Primary ? ?PHYSICIAN ASSISTANT:  ? ?ASSISTANTS: Bergman,NP  ? ?ANESTHESIA:   general ? ?EBL:  50 mL  ? ?BLOOD ADMINISTERED:none ? ?DRAINS: none  ? ?LOCAL MEDICATIONS USED:  MARCAINE    ? ?SPECIMEN:  No Specimen ? ?DISPOSITION OF SPECIMEN:  N/A ? ?COUNTS:  YES ? ?TOURNIQUET:  * No tourniquets in log * ? ?DICTATION: .Dragon Dictation ? ?PLAN OF CARE: Admit for overnight observation ? ?PATIENT DISPOSITION:  PACU - hemodynamically stable. ?  ?Delay start of Pharmacological VTE agent (>24hrs) due to surgical blood loss or risk of bleeding: yes ? ?

## 2021-10-21 NOTE — Op Note (Signed)
Date of procedure: 10/21/2021 ? ?Date of dictation: Same ? ?Service: Neurosurgery ? ?Preoperative diagnosis: L3-4, L4-5 stenosis with neurogenic claudication ? ?Postoperative diagnosis: Same ? ?Procedure Name: Bilateral L3-4, L4-5 decompressive laminotomies and foraminotomies ? ?Surgeon:Keerstin Bjelland A.Aryannah Mohon, M.D. ? ?Asst. Surgeon: Reinaldo Meeker, NP ? ?Anesthesia: General ? ?Indication: 71 year old female with back and bilateral lower extremity pain numbness and weakness consistent with severe neurogenic claudication.  Work-up demonstrates evidence of severe spinal stenosis at L4-5 and moderately severe stenosis at L3-4.  Patient has failed conservative management.  She presents now for lumbar decompressive surgery in hopes of improving her symptoms. ? ?Operative note: After induction anesthesia, patient position prone onto Wilson frame and properly padded.  Lumbar region prepped and draped sterilely.  Incision made overlying L4.  Dissection performed bilaterally.  Retractor placed.  X-ray taken and the L4-5 level was confirmed.  Decompressive laminotomies were then performed bilaterally at L4-5 and then later L3-4 by removing the inferior aspect of the lamina above the medial aspect of the facet joint and the superior rim of the lamina below.  Ligament flavum elevated and resected.  Foraminotomies were completed along the course the exiting L3, L4 and L5 nerve roots.  At this point a very thorough decompression of been achieved bilaterally.  There was no evidence of injury to the thecal sac or nerve roots.  Wound was then irrigated.  Gelfoam was placed topically for hemostasis.  Wound is then closed in layers with Vicryl sutures.  Steri-Strips and sterile dressing were applied.  No apparent complications.  Patient tolerated the procedure well and she returns to the recovery room postop. ? ?

## 2021-10-21 NOTE — Anesthesia Procedure Notes (Signed)
Procedure Name: Intubation ?Date/Time: 10/21/2021 4:06 PM ?Performed by: Georgia Duff, CRNA ?Pre-anesthesia Checklist: Patient identified, Emergency Drugs available, Suction available and Patient being monitored ?Patient Re-evaluated:Patient Re-evaluated prior to induction ?Oxygen Delivery Method: Circle System Utilized ?Preoxygenation: Pre-oxygenation with 100% oxygen ?Induction Type: IV induction ?Ventilation: Mask ventilation without difficulty ?Laryngoscope Size: Sabra Heck and 2 ?Grade View: Grade I ?Tube type: Oral ?Tube size: 7.0 mm ?Number of attempts: 1 ?Airway Equipment and Method: Stylet and Oral airway ?Placement Confirmation: ETT inserted through vocal cords under direct vision, positive ETCO2 and breath sounds checked- equal and bilateral ?Secured at: 21 cm ?Tube secured with: Tape ?Dental Injury: Teeth and Oropharynx as per pre-operative assessment  ? ? ? ? ?

## 2021-10-21 NOTE — Progress Notes (Signed)
Pt brought home cpap unit. RT filled water chamber with sterile water. Pt able to place self on unit when ready for bed. ?

## 2021-10-21 NOTE — Transfer of Care (Signed)
Immediate Anesthesia Transfer of Care Note ? ?Patient: Rebecca Brooks ? ?Procedure(s) Performed: Laminectomy and Foraminotomy - bilateral - Lumbar Three-Lumbar Four - Lumbar Four- Lumbar Five (Bilateral: Back) ? ?Patient Location: PACU ? ?Anesthesia Type:General ? ?Level of Consciousness: drowsy and patient cooperative ? ?Airway & Oxygen Therapy: Patient Spontanous Breathing ? ?Post-op Assessment: Report given to RN and Post -op Vital signs reviewed and stable ? ?Post vital signs: Reviewed and stable ? ?Last Vitals:  ?Vitals Value Taken Time  ?BP 139/78 10/21/21 1827  ?Temp    ?Pulse 87 10/21/21 1834  ?Resp 16 10/21/21 1834  ?SpO2 97 % 10/21/21 1834  ?Vitals shown include unvalidated device data. ? ?Last Pain:  ?Vitals:  ? 10/21/21 1227  ?TempSrc:   ?PainSc: 4   ?   ? ?  ? ?Complications: No notable events documented. ?

## 2021-10-21 NOTE — H&P (Signed)
?Rebecca Brooks is an 71 y.o. female.   ?Chief Complaint: Back pain ?HPI: 71 year old female with progressive lumbar pain with radiation into both lower extremities.  Pain worsened with standing or ambulation.  Work-up demonstrates evidence of significant lumbar disc generation with severe multifactorial spinal stenosis at L4-5 and moderately severe stenosis at L3-4.  Patient has failed conservative management and presents now for decompressive surgery in hopes of improving her symptoms. ? ?Past Medical History:  ?Diagnosis Date  ? Abnormal nuclear stress test   ? False positive. Cath 08/23/15 showed Angiographically normal coronary arteries  ? Anemia   ? Ankle fracture   ? Arthritis   ? Asthma   ? "asthmatic bronchitis"  ? Back pain   ? Chicken pox as a child  ? Edema of both lower extremities   ? Essential hypertension 07/17/2011  ? ACEI d/c 06/2011 for refractory cough> resolved   ? GERD (gastroesophageal reflux disease)   ? Hyperlipidemia   ? Hypertension   ? Hypothyroidism   ? Insomnia 11/01/2014  ? Insulin resistance   ? pt reports she was told this in the past, does not take any medication for this  ? Joint pain   ? Low back pain radiating to right leg   ? intermittent and occasional numbness of skin over right hip in certain positions  ? Measles as a child  ? Mumps as a child  ? Obesity   ? Osteoarthritis   ? Palpitation   ? Pneumonia   ? this October 2013 from which she has said inhailer  ? PONV (postoperative nausea and vomiting)   ? "violent vomiting after surgery"  ? Recurrent epistaxis 02/20/2012  ? RLS (restless legs syndrome) 11/01/2014  ? Skin cancer   ? Sleep apnea   ? SOB (shortness of breath) on exertion   ? Spinal stenosis   ? Vitamin B 12 deficiency   ? Vitamin D deficiency   ? ? ?Past Surgical History:  ?Procedure Laterality Date  ? CARDIAC CATHETERIZATION N/A 08/23/2015  ? Procedure: Left Heart Cath and Coronary Angiography;  Surgeon: [name], MD;  Location: [facility];   Service: Cardiovascular;  Laterality: N/A;  ? CESAREAN SECTION    ? X 3  ? GASTRECTOMY  12/2017  ? INGUINAL HERNIA REPAIR Right 71 yrs old  ? THYROIDECTOMY    ? total for benign tumor, Parathyroid spared  ? TONSILLECTOMY    ? TOTAL HIP ARTHROPLASTY Right 2006  ? secondary to congenital  hip defect  ? ? ?Family History  ?Problem Relation Age of Onset  ? Cancer Mother   ? Heart failure Mother   ? Lung cancer Mother 29  ?     smoker  ? Heart Problems Mother   ?     tachycardia  ? Hearing loss Mother   ? Anxiety disorder Mother   ?     anxiety, claustrophobia  ? Obesity Mother   ? Osteoarthritis Father   ? Hyperlipidemia Father   ? Anuerysm Father   ?     AAA  ? Sudden death Father   ? Hyperlipidemia Sister   ? Migraines Sister   ? Hypertension Sister   ? Heart Problems Sister   ?     tachycardia  ? Colon cancer Maternal Grandmother   ? Heart attack Maternal Grandfather   ? Aneurysm Paternal Grandfather   ?     abdominal  ? Heart disease Paternal Grandfather   ?  AAA rupture, smoker  ? ?Social History:  reports that she quit smoking about 50 years ago. Her smoking use included cigarettes. She has a 2.50 pack-year smoking history. She has never used smokeless tobacco. She reports current alcohol use of about 10.0 - 12.0 standard drinks per week. She reports that she does not use drugs. ? ?Allergies:  ?Allergies  ?Allergen Reactions  ? Bee Venom Anaphylaxis  ? Accupril [Quinapril Hcl] Cough  ? Quinapril-Hydrochlorothiazide Cough  ? Rosuvastatin Other (See Comments)  ?  Leg aches.  ? ? ?Medications Prior to Admission  ?Medication Sig Dispense Refill  ? acetaminophen (TYLENOL) 500 MG tablet Take 1,000 mg by mouth in the morning and at bedtime.    ? albuterol (VENTOLIN HFA) 108 (90 Base) MCG/ACT inhaler Inhale 1-2 puffs into the lungs every 6 (six) hours as needed for wheezing or shortness of breath. 1 each 5  ? benzonatate (TESSALON) 200 MG capsule Take 1 capsule (200 mg total) by mouth 3 (three) times daily as needed for  cough. 20 capsule 0  ? cetirizine (ZYRTEC) 10 MG tablet TAKE 1 TABLET BY MOUTH DAILY 90 tablet 0  ? diclofenac sodium (VOLTAREN) 1 % GEL APPLY 2-4 GRAMS TOPICALLY TO AFFECTED JOINT UP TO 4 TIMES DAILY 400 g 2  ? famotidine (PEPCID) 20 MG tablet TAKE 1 TABLET(20 MG) BY MOUTH AT BEDTIME 90 tablet 3  ? fluticasone (FLONASE) 50 MCG/ACT nasal spray SHAKE LIQUID AND USE 2 SPRAYS IN EACH NOSTRIL DAILY (Patient taking differently: 2 sprays at bedtime. SHAKE LIQUID AND USE 2 SPRAYS IN EACH NOSTRIL DAILY) 48 g 1  ? guaiFENesin (MUCINEX) 600 MG 12 hr tablet Take 2 tablets (1,200 mg total) by mouth 2 (two) times daily. (Patient taking differently: Take 1,200 mg by mouth 2 (two) times daily as needed for cough.) 30 tablet 2  ? hydrochlorothiazide (HYDRODIURIL) 25 MG tablet TAKE 1 TABLET BY MOUTH DAILY 90 tablet 1  ? levothyroxine (SYNTHROID) 100 MCG tablet TAKE 1 TABLET(100 MCG) BY MOUTH DAILY BEFORE BREAKFAST 90 tablet 0  ? metoprolol tartrate (LOPRESSOR) 50 MG tablet TAKE 1 TABLET(50 MG) BY MOUTH TWICE DAILY 60 tablet 1  ? pantoprazole (PROTONIX) 40 MG tablet TAKE 1 TABLET(40 MG) BY MOUTH DAILY 90 tablet 3  ? rOPINIRole (REQUIP) 0.5 MG tablet Take 1 mg by mouth at bedtime.    ? simvastatin (ZOCOR) 40 MG tablet Take 1 tablet (40 mg total) by mouth at bedtime. 90 tablet 3  ? traMADol (ULTRAM) 50 MG tablet Take 1 tablet (50 mg total) by mouth 3 (three) times daily as needed. 90 tablet 0  ? Vitamin D3 (VITAMIN D) 25 MCG tablet Take 1,000 Units by mouth daily.    ? aspirin EC 81 MG tablet Take 1 tablet (81 mg total) by mouth daily.    ? furosemide (LASIX) 20 MG tablet TAKE 2 TABLETS BY MOUTH DAILY FOR 5 DAYS THEN DECREASE TO 1 TABLET BY MOUTH DAILY AS NEEDED (Patient taking differently: Take 20 mg by mouth daily as needed (fluid retention.).) 180 tablet 0  ? ibuprofen (ADVIL) 200 MG tablet Take 400 mg by mouth 2 (two) times daily as needed (pain.).    ? potassium chloride SA (K-DUR,KLOR-CON) 20 MEQ tablet TAKE 1 TABLET BY MOUTH  DAILY AS NEEDED(DEPENDING ON LASIX USE) 90 tablet 3  ? rOPINIRole (REQUIP) 1 MG tablet Take 1 tablet (1 mg total) by mouth at bedtime. (Patient not taking: Reported on 10/18/2021) 90 tablet 1  ? Spacer/Aero-Holding Chambers (POCKET SPACER)  DEVI 1 each by Does not apply route as needed. 1 each 0  ? topiramate (TOPAMAX) 25 MG tablet Take 1 tablet (25 mg total) by mouth at bedtime. (Patient not taking: Reported on 10/18/2021) 30 tablet 0  ? zolpidem (AMBIEN) 10 MG tablet TAKE 1 TABLET(10 MG) BY MOUTH AT BEDTIME AS NEEDED FOR SLEEP 30 tablet 0  ? ? ?Results for orders placed or performed during the hospital encounter of 10/21/21 (from the past 48 hour(s))  ?Basic metabolic panel per protocol     Status: None  ? Collection Time: 10/21/21 11:47 AM  ?Result Value Ref Range  ? Sodium 139 135 - 145 mmol/L  ? Potassium 3.8 3.5 - 5.1 mmol/L  ? Chloride 103 98 - 111 mmol/L  ? CO2 26 22 - 32 mmol/L  ? Glucose, Bld 99 70 - 99 mg/dL  ?  Comment: Glucose reference range applies only to samples taken after fasting for at least 8 hours.  ? BUN 15 8 - 23 mg/dL  ? Creatinine, Ser 0.73 0.44 - 1.00 mg/dL  ? Calcium 9.3 8.9 - 10.3 mg/dL  ? GFR, Estimated >60 >60 mL/min  ?  Comment: (NOTE) ?Calculated using the CKD-EPI Creatinine Equation (2021) ?  ? Anion gap 10 5 - 15  ?  Comment: Performed at [facility], [place] [address] [place], [place] [address]  ?CBC per protocol     Status: Abnormal  ? Collection Time: 10/21/21 11:47 AM  ?Result Value Ref Range  ? WBC 9.2 4.0 - 10.5 K/uL  ? RBC 4.67 3.87 - 5.11 MIL/uL  ? Hemoglobin 14.6 12.0 - 15.0 g/dL  ? HCT 44.1 36.0 - 46.0 %  ? MCV 94.4 80.0 - 100.0 fL  ? MCH 31.3 26.0 - 34.0 pg  ? MCHC 33.1 30.0 - 36.0 g/dL  ? RDW 13.3 11.5 - 15.5 %  ? Platelets 422 (H) 150 - 400 K/uL  ? nRBC 0.0 0.0 - 0.2 %  ?  Comment: Performed at [facility], [place] [address], [place], [place] [address]  ?Surgical pcr screen     Status: None  ? Collection Time: 10/21/21 12:44 PM  ? Specimen: Nasal Mucosa; Nasal  Swab  ?Result Value Ref Range  ? MRSA, PCR NEGATIVE NEGATIVE  ? Staphylococcus aureus NEGATIVE NEGATIVE  ?  Comment: (NOTE) ?The Xpert SA Assay (FDA approved for NASAL specimens in patients 46 ?years of age

## 2021-10-22 ENCOUNTER — Encounter (HOSPITAL_COMMUNITY): Payer: Self-pay | Admitting: Neurosurgery

## 2021-10-22 DIAGNOSIS — I10 Essential (primary) hypertension: Secondary | ICD-10-CM | POA: Diagnosis not present

## 2021-10-22 DIAGNOSIS — Z7982 Long term (current) use of aspirin: Secondary | ICD-10-CM | POA: Diagnosis not present

## 2021-10-22 DIAGNOSIS — Z96641 Presence of right artificial hip joint: Secondary | ICD-10-CM | POA: Diagnosis not present

## 2021-10-22 DIAGNOSIS — Z85828 Personal history of other malignant neoplasm of skin: Secondary | ICD-10-CM | POA: Diagnosis not present

## 2021-10-22 DIAGNOSIS — Z87891 Personal history of nicotine dependence: Secondary | ICD-10-CM | POA: Diagnosis not present

## 2021-10-22 DIAGNOSIS — J45909 Unspecified asthma, uncomplicated: Secondary | ICD-10-CM | POA: Diagnosis not present

## 2021-10-22 DIAGNOSIS — E039 Hypothyroidism, unspecified: Secondary | ICD-10-CM | POA: Diagnosis not present

## 2021-10-22 DIAGNOSIS — M48062 Spinal stenosis, lumbar region with neurogenic claudication: Secondary | ICD-10-CM | POA: Diagnosis not present

## 2021-10-22 DIAGNOSIS — Z79899 Other long term (current) drug therapy: Secondary | ICD-10-CM | POA: Diagnosis not present

## 2021-10-22 MED ORDER — CYCLOBENZAPRINE HCL 10 MG PO TABS: 10.0000 mg | ORAL_TABLET | Freq: Three times a day (TID) | ORAL | 0 refills | Status: DC | PRN

## 2021-10-22 MED ORDER — HYDROCODONE-ACETAMINOPHEN 10-325 MG PO TABS: 2.0000 | ORAL_TABLET | ORAL | 0 refills | Status: DC | PRN

## 2021-10-22 MED ORDER — HYDROCODONE-ACETAMINOPHEN 10-325 MG PO TABS
2.0000 | ORAL_TABLET | ORAL | 0 refills | Status: DC | PRN
Start: 2021-10-22 — End: 2022-05-05

## 2021-10-22 NOTE — Anesthesia Postprocedure Evaluation (Signed)
Anesthesia Post Note ? ?Patient: Rebecca Brooks ? ?Procedure(s) Performed: Laminectomy and Foraminotomy - bilateral - Lumbar Three-Lumbar Four - Lumbar Four- Lumbar Five (Bilateral: Back) ? ?  ? ?Patient location during evaluation: PACU ?Anesthesia Type: General ?Level of consciousness: awake ?Pain management: pain level controlled ?Vital Signs Assessment: post-procedure vital signs reviewed and stable ?Respiratory status: spontaneous breathing, nonlabored ventilation, respiratory function stable and patient connected to nasal cannula oxygen ?Cardiovascular status: blood pressure returned to baseline and stable ?Postop Assessment: no apparent nausea or vomiting ?Anesthetic complications: no ? ? ?No notable events documented. ? ?Last Vitals:  ?Vitals:  ? 10/21/21 2315 10/22/21 0400  ?BP: (!) 93/49 138/73  ?Pulse: 94 90  ?Resp: 18 18  ?Temp: 36.4 ?C 36.6 ?C  ?SpO2: 94% 94%  ?  ?Last Pain:  ?Vitals:  ? 10/22/21 0626  ?TempSrc:   ?PainSc: 2   ? ? ?  ?  ?  ?  ?  ?  ? ?Rebecca Brooks Rebecca Brooks ? ? ? ? ?

## 2021-10-22 NOTE — Evaluation (Signed)
Occupational Therapy Evaluation ?Patient Details ?Name: Rebecca Brooks ?MRN: [id number] ?DOB: 1951-01-03 ?Today's Date: 10/22/2021 ? ? ?History of Present Illness This 71 y.o. admitted for L3-4, L4-5 decompressive laminotomies and foraminotomies secondary to neurogenic claudication.  PMH includes: h/o ankle fx, measles, mumps, OA, PNA, RLS, sleep apnea, s/p lumbar laminectomy/decompressive microdiscectomy, THA  ? ?Clinical Impression ?  ?Patient evaluated by Occupational Therapy with no further acute OT needs identified. All education has been completed and the patient has no further questions. Pt is able to complete ADLs with min guard assist.  She does have difficulty accessing peri area - discussed AE options.  She requires min cues for precautions.  See below for any follow-up Occupational Therapy or equipment needs. OT is signing off. Thank you for this referral. ?  ?   ? ?Recommendations for follow up therapy are one component of a multi-disciplinary discharge planning process, led by the attending physician.  Recommendations may be updated based on patient status, additional functional criteria and insurance authorization.  ? ?Follow Up Recommendations ? No OT follow up  ?  ?Assistance Recommended at Discharge Intermittent Supervision/Assistance  ?Patient can return home with the following A little help with bathing/dressing/bathroom;Assistance with cooking/housework;Assist for transportation;Help with stairs or ramp for entrance ? ?  ?Functional Status Assessment ? Patient has had a recent decline in their functional status and demonstrates the ability to make significant improvements in function in a reasonable and predictable amount of time.  ?Equipment Recommendations ? None recommended by OT  ?  ?Recommendations for Other Services   ? ? ?  ?Precautions / Restrictions Precautions ?Precautions: Back;Fall ?Precaution Booklet Issued: Yes (comment) ?Precaution Comments: min cues for precautions  ? ?   ? ?Mobility Bed Mobility ?Overal bed mobility: Needs Assistance ?Bed Mobility: Rolling, Sidelying to Sit ?Rolling: Supervision ?Sidelying to sit: Supervision ?  ?  ?  ?  ?  ? ?Transfers ?  ?  ?  ?  ?  ?  ?  ?  ?  ?  ?  ? ?  ?Balance   ?  ?  ?  ?  ?Standing balance support: During functional activity ?Standing balance-Leahy Scale: Fair ?Standing balance comment: able to maintain static standing without UE support ?  ?  ?  ?  ?  ?  ?  ?  ?  ?  ?  ?   ? ?ADL either performed or assessed with clinical judgement  ? ?ADL Overall ADL's : Needs assistance/impaired ?Eating/Feeding: Independent ?  ?Grooming: Wash/dry hands;Wash/dry face;Oral care;Brushing hair;Min guard;Standing ?Grooming Details (indicate cue type and reason): reviewed safe techniques ?Upper Body Bathing: Set up;Supervision/ safety;Sitting ?  ?Lower Body Bathing: Min guard;Sit to/from stand ?  ?Upper Body Dressing : Set up;Sitting ?  ?Lower Body Dressing: Min guard;Sit to/from stand ?Lower Body Dressing Details (indicate cue type and reason): able to perform figure 4 ?Toilet Transfer: Min guard;Comfort height toilet;Ambulation;Grab bars ?  ?Toileting- Clothing Manipulation and Hygiene: Moderate assistance;Sit to/from stand ?Toileting - Clothing Manipulation Details (indicate cue type and reason): Pt unable to access posterior peri area, but she reports she has a bidet.  Discussed options of toileting aid ?  ?Tub/Shower Transfer Details (indicate cue type and reason): Pt reports she has a tub transfer bench in her attic.  Suggested she have family retrieve it and use it for tub transfers.  She does have grab bars in her tub, but would require bending to reach the one at back of tub ?Functional mobility during ADLs:  Min guard [facility]) ?General ADL Comments: reviewed safety with IADLs  ? ? ? ?Vision Baseline Vision/History: 1 Wears glasses ?Patient Visual Report: No change from baseline ?   ?   ?Perception   ?  ?Praxis   ?  ? ?Pertinent Vitals/Pain Pain  Assessment ?Pain Assessment: 0-10 ?Pain Score: 6  ?Pain Location: back ?Pain Descriptors / Indicators: Operative site guarding ?Pain Intervention(s): Monitored during session, Repositioned, Patient requesting pain meds-RN notified  ? ? ? ?Hand Dominance Right ?  ?Extremity/Trunk Assessment Upper Extremity Assessment ?Upper Extremity Assessment: Defer to OT evaluation ?  ?Lower Extremity Assessment ?Lower Extremity Assessment: Generalized weakness (hx of bilateral knee pain and weakness - needs knee replacements) ?  ?Cervical / Trunk Assessment ?Cervical / Trunk Assessment: Back Surgery ?  ?Communication Communication ?Communication: No difficulties ?  ?Cognition Arousal/Alertness: Awake/alert ?Behavior During Therapy: [facility] for tasks assessed/performed ?Overall Cognitive Status: Within Functional Limits for tasks assessed ?  ?  ?  ?  ?  ?  ?  ?  ?  ?  ?  ?  ?  ?  ?  ?  ?  ?  ?  ?General Comments    ? ?  ?Exercises   ?  ?Shoulder Instructions    ? ? ?Home Living Family/patient expects to be discharged to:: Private residence ?Living Arrangements: Spouse/significant other ?Available Help at Discharge: Family;Available 24 hours/day ?Type of Home: House ?Home Access: Stairs to enter ?Entrance Stairs-Number of Steps: 3 ?Entrance Stairs-Rails: Right;Left ?Home Layout: Two level;Able to live on main level with bedroom/bathroom ?  ?  ?Bathroom Shower/Tub: Tub/shower unit;Curtain ?  ?Bathroom Toilet: Handicapped height ?Bathroom Accessibility: Yes ?How Accessible: Accessible via walker ?Home Equipment: Conservation officer, nature (2 wheels);Cane - single point;Tub bench;BSC/3in1;Adaptive equipment ?Adaptive Equipment: Reacher ?  ?  ? ?  ?Prior Functioning/Environment Prior Level of Function : Independent/Modified Independent ?  ?  ?  ?  ?  ?  ?Mobility Comments: ambulates with SPC ?  ?  ? ?  ?  ?OT Problem List: Decreased strength;Impaired balance (sitting and/or standing);Decreased knowledge of precautions;Decreased knowledge of use of DME  or AE;Pain ?  ?   ?OT Treatment/Interventions:    ?  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  ? ?Co-evaluation   ?  ?  ?  ?  ? ?  ?AM-PAC OT "6 Clicks" Daily Activity     ?Outcome Measure Help from another person eating meals?: None ?Help from another person taking care of personal grooming?: A Little ?Help from another person toileting, which includes using toliet, bedpan, or urinal?: A Little ?Help from another person bathing (including washing, rinsing, drying)?: A Little ?Help from another person to put on and taking off regular upper body clothing?: A Little ?Help from another person to put on and taking off regular lower body clothing?: A Little ?6 Click Score: 19 ?  ?End of Session Equipment Utilized During Treatment: Other (comment) [facility]) ?Nurse Communication: Mobility status ? ?Activity Tolerance: Patient tolerated treatment well ?Patient left: in chair;with call bell/phone within reach ? ?OT Visit Diagnosis: Unsteadiness on feet (R26.81);Pain ?Pain - part of body:  (back)  ?              ?Time: 7353-2992 ?OT Time Calculation (min): 45 min ?Charges:  OT General Charges ?$OT  Visit: 1 Visit ?OT Evaluation ?$OT Eval Moderate Complexity: 1 Mod ?OT Treatments ?$Self Care/Home Management : 23-37 mins ? ?Makenzee Choudhry C., OTR/L ?Acute Rehabilitation Services ?Pager [phone] ?Office [phone] ? ? ?Liborio Saccente M ?10/22/2021, 10:27 AM ?

## 2021-10-22 NOTE — Discharge Summary (Signed)
? ?Physician Discharge Summary  ?Patient ID: ?Rebecca Brooks ?MRN: [id number] ?DOB/AGE: 72-Aug-1952 72 y.o. ? ?Admit date: 10/21/2021 ?Discharge date: 10/22/2021 ? ?Admission Diagnoses:  ?L3-4, L4-5 stenosis with neurogenic claudication ? ?Discharge Diagnoses:  ?Same ?Principal Problem: ?  Lumbar stenosis with neurogenic claudication ? ? ?Discharged Condition: Stable ? ?Hospital Course:  ?Rebecca Brooks is a 71 y.o. female that underwent an elective L3-5 laminotomy and foraminotomies for decompression on 10/21/2021.  She tolerated the surgery well.  Postoperatively her radicular pain is improved.  Her incisional pain was controlled on oral medication.  She was ambulating independently, having normal bowel bladder function and tolerating a normal diet. ? ?Treatments: Surgery -L3-5 bilateral laminotomy, foraminotomies ? ?Discharge Exam: ?Blood pressure 138/65, pulse 83, temperature 98.7 ?F (37.1 ?C), temperature source Oral, resp. rate 18, height 5' 0.25" (1.53 m), weight 120.7 kg, SpO2 97 %. ?Awake, alert, oriented x3 ?PERRLA ?Speech fluent, appropriate ?CN grossly intact ?5/5 BUE/BLE ?Wound c/d/i ? ?Disposition: Discharge disposition: 01-Home or Self Care ? ? ? ? ? ? ? ?Allergies as of 10/22/2021   ? ?   Reactions  ? Bee Venom Anaphylaxis  ? Accupril [quinapril Hcl] Cough  ? Quinapril-hydrochlorothiazide Cough  ? Rosuvastatin Other (See Comments)  ? Leg aches.  ? ?  ? ?  ?Medication List  ?  ? ?STOP taking these medications   ? ?aspirin EC 81 MG tablet ?  ?ibuprofen 200 MG tablet ?Commonly known as: ADVIL ?  ?traMADol 50 MG tablet ?Commonly known as: ULTRAM ?  ? ?  ? ?TAKE these medications   ? ?acetaminophen 500 MG tablet ?Commonly known as: TYLENOL ?Take 1,000 mg by mouth in the morning and at bedtime. ?  ?albuterol 108 (90 Base) MCG/ACT inhaler ?Commonly known as: VENTOLIN HFA ?Inhale 1-2 puffs into the lungs every 6 (six) hours as needed for wheezing or shortness of breath. ?  ?benzonatate 200 MG  capsule ?Commonly known as: TESSALON ?Take 1 capsule (200 mg total) by mouth 3 (three) times daily as needed for cough. ?  ?cetirizine 10 MG tablet ?Commonly known as: ZYRTEC ?TAKE 1 TABLET BY MOUTH DAILY ?  ?cyclobenzaprine 10 MG tablet ?Commonly known as: FLEXERIL ?Take 1 tablet (10 mg total) by mouth 3 (three) times daily as needed for muscle spasms. ?  ?diclofenac sodium 1 % Gel ?Commonly known as: VOLTAREN ?APPLY 2-4 GRAMS TOPICALLY TO AFFECTED JOINT UP TO 4 TIMES DAILY ?  ?famotidine 20 MG tablet ?Commonly known as: PEPCID ?TAKE 1 TABLET(20 MG) BY MOUTH AT BEDTIME ?  ?fluticasone 50 MCG/ACT nasal spray ?Commonly known as: FLONASE ?SHAKE LIQUID AND USE 2 SPRAYS IN EACH NOSTRIL DAILY ?What changed: See the new instructions. ?  ?furosemide 20 MG tablet ?Commonly known as: LASIX ?TAKE 2 TABLETS BY MOUTH DAILY FOR 5 DAYS THEN DECREASE TO 1 TABLET BY MOUTH DAILY AS NEEDED ?What changed: See the new instructions. ?  ?guaiFENesin 600 MG 12 hr tablet ?Commonly known as: Mucinex ?Take 2 tablets (1,200 mg total) by mouth 2 (two) times daily. ?What changed:  ?when to take this ?reasons to take this ?  ?hydrochlorothiazide 25 MG tablet ?Commonly known as: HYDRODIURIL ?TAKE 1 TABLET BY MOUTH DAILY ?  ?HYDROcodone-acetaminophen 10-325 MG tablet ?Commonly known as: NORCO ?Take 2 tablets by mouth every 4 (four) hours as needed for severe pain ((score 7 to 10)). ?  ?levothyroxine 100 MCG tablet ?Commonly known as: SYNTHROID ?TAKE 1 TABLET(100 MCG) BY MOUTH DAILY BEFORE BREAKFAST ?  ?metoprolol tartrate 50 MG  tablet ?Commonly known as: LOPRESSOR ?TAKE 1 TABLET(50 MG) BY MOUTH TWICE DAILY ?  ?pantoprazole 40 MG tablet ?Commonly known as: PROTONIX ?TAKE 1 TABLET(40 MG) BY MOUTH DAILY ?  ?Pocket Spacer Devi ?1 each by Does not apply route as needed. ?  ?potassium chloride SA 20 MEQ tablet ?Commonly known as: KLOR-CON M ?TAKE 1 TABLET BY MOUTH DAILY AS NEEDED(DEPENDING ON LASIX USE) ?  ?rOPINIRole 1 MG tablet ?Commonly known as:  Requip ?Take 1 tablet (1 mg total) by mouth at bedtime. ?  ?rOPINIRole 0.5 MG tablet ?Commonly known as: REQUIP ?Take 1 mg by mouth at bedtime. ?  ?simvastatin 40 MG tablet ?Commonly known as: ZOCOR ?Take 1 tablet (40 mg total) by mouth at bedtime. ?  ?topiramate 25 MG tablet ?Commonly known as: Topamax ?Take 1 tablet (25 mg total) by mouth at bedtime. ?  ?Vitamin D3 25 MCG tablet ?Commonly known as: Vitamin D ?Take 1,000 Units by mouth daily. ?  ?zolpidem 10 MG tablet ?Commonly known as: AMBIEN ?TAKE 1 TABLET(10 MG) BY MOUTH AT BEDTIME AS NEEDED FOR SLEEP ?  ? ?  ? ? Follow-up Information   ? ? Earnie Larsson, MD Follow up.   ?Specialty: Neurosurgery ?Contact information: ?[address] ?[address] ?[place] [address] ?[phone] ? ? ?  ?  ? ?  ?  ? ?  ? ? ?Signed: ?Theodoro Doing Gibril Mastro ?10/22/2021, 8:26 AM ? ? ?

## 2021-10-22 NOTE — Evaluation (Signed)
Physical Therapy Evaluation & Discharge ?Patient Details ?Name: Rebecca Brooks ?MRN: [id number] ?DOB: 1950/10/12 ?Today's Date: 10/22/2021 ? ?History of Present Illness ? This 71 y.o. admitted for L3-4, L4-5 decompressive laminotomies and foraminotomies secondary to neurogenic claudication.  PMH includes: h/o ankle fx, measles, mumps, OA, PNA, RLS, sleep apnea, s/p lumbar laminectomy/decompressive microdiscectomy, THA  ?Clinical Impression ? Patient admitted following above procedure. Patient requires minA for stair negotiation with rail and cane but otherwise is functioning at supervision level for ambulation with use of RW. Patient will have husband present to assist with stairs to access home. Educated patient on back precautions and progressive walking program, patient verbalized understanding. Reinforced back precautions during session but due to B knee pain and weakness, at times patient has to flex trunk to assist with momentum to stand. No further skilled PT needs required acutely. No PT follow up recommended at this time, however would highly benefit from referral to OPPT for B LE strengthening once cleared from back precautions.    ?   ? ?Recommendations for follow up therapy are one component of a multi-disciplinary discharge planning process, led by the attending physician.  Recommendations may be updated based on patient status, additional functional criteria and insurance authorization. ? ?Follow Up Recommendations No PT follow up ? ?  ?Assistance Recommended at Discharge Intermittent Supervision/Assistance  ?Patient can return home with the following ? A little help with bathing/dressing/bathroom;Assistance with cooking/housework;Help with stairs or ramp for entrance ? ?  ?Equipment Recommendations None recommended by PT  ?Recommendations for Other Services ?    ?  ?Functional Status Assessment Patient has had a recent decline in their functional status and demonstrates the ability to make  significant improvements in function in a reasonable and predictable amount of time.  ? ?  ?Precautions / Restrictions Precautions ?Precautions: Back;Fall ?Precaution Booklet Issued: Yes (comment) ?Precaution Comments: min cues for precautions  ? ?  ? ?Mobility ? Bed Mobility ?  ?  ?  ?  ?  ?  ?  ?General bed mobility comments: in recliner on arrival ?  ? ?Transfers ?Overall transfer level: Needs assistance ?Equipment used: Rolling Arena Lindahl (2 wheels), Straight cane ?Transfers: Sit to/from Stand ?Sit to Stand: Min guard ?  ?  ?  ?  ?  ?General transfer comment: min guard for safety from recliner and w/c x 3. use of momentum to come to standing and utilizing arms of recliner/wheelchair. Cues for maintaining back precautions ?  ? ?Ambulation/Gait ?Ambulation/Gait assistance: Supervision ?Gait Distance (Feet): 150 Feet (10) ?Assistive device: Rolling Atiana Levier (2 wheels), Straight cane ?Gait Pattern/deviations: Step-through pattern, Decreased stride length, Trunk flexed ?Gait velocity: decreased ?  ?  ?General Gait Details: mild trunk flexion during ambulation. Use of RW for support. Able to ambulate short distance in room with [facility] and supervision ? ?Stairs ?Stairs: Yes ?Stairs assistance: Min assist ?Stair Management: One rail Left, With cane, Step to pattern, Forwards ?Number of Stairs: 3 ?General stair comments: ascends stairs with L hand rail and cane with pulling self up with UEs and leading with R LE. Descends sideways with rail on L and cane ? ?Wheelchair Mobility ?  ? ?Modified Rankin (Stroke Patients Only) ?  ? ?  ? ?Balance Overall balance assessment: Needs assistance ?Sitting-balance support: No upper extremity supported, Feet supported ?Sitting balance-Leahy Scale: Good ?  ?  ?Standing balance support: Bilateral upper extremity supported, Reliant on assistive device for balance ?Standing balance-Leahy Scale: Poor ?Standing balance comment: reliant on RW ?  ?  ?  ?  ?  ?  ?  ?  ?  ?  ?  ?   ? ? ? ?  Pertinent  Vitals/Pain Pain Assessment ?Pain Assessment: 0-10 ?Pain Score: 6  ?Pain Location: back ?Pain Descriptors / Indicators: Grimacing ?Pain Intervention(s): Monitored during session  ? ? ?Home Living Family/patient expects to be discharged to:: Private residence ?Living Arrangements: Spouse/significant other ?Available Help at Discharge: Family;Available 24 hours/day ?Type of Home: House ?Home Access: Stairs to enter ?Entrance Stairs-Rails: Right;Left ?Entrance Stairs-Number of Steps: 3 ?  ?Home Layout: Two level;Able to live on main level with bedroom/bathroom ?Home Equipment: Conservation officer, nature (2 wheels);Cane - single point;Tub bench;BSC/3in1;Adaptive equipment ?   ?  ?Prior Function Prior Level of Function : Independent/Modified Independent ?  ?  ?  ?  ?  ?  ?Mobility Comments: ambulates with SPC ?  ?  ? ? ?Hand Dominance  ?   ? ?  ?Extremity/Trunk Assessment  ? Upper Extremity Assessment ?Upper Extremity Assessment: Defer to OT evaluation ?  ? ?Lower Extremity Assessment ?Lower Extremity Assessment: Generalized weakness (hx of bilateral knee pain and weakness - needs knee replacements) ?  ? ?Cervical / Trunk Assessment ?Cervical / Trunk Assessment: Back Surgery  ?Communication  ? Communication: No difficulties  ?Cognition Arousal/Alertness: Awake/alert ?Behavior During Therapy: [facility] for tasks assessed/performed ?Overall Cognitive Status: Within Functional Limits for tasks assessed ?  ?  ?  ?  ?  ?  ?  ?  ?  ?  ?  ?  ?  ?  ?  ?  ?  ?  ?  ? ?  ?General Comments   ? ?  ?Exercises    ? ?Assessment/Plan  ?  ?PT Assessment Patient does not need any further PT services  ?PT Problem List   ? ?   ?  ?PT Treatment Interventions     ? ?PT Goals (Current goals can be found in the Care Plan section)  ?Acute Rehab PT Goals ?Patient Stated Goal: to go home ?PT Goal Formulation: All assessment and education complete, DC therapy ? ?  ?Frequency   ?  ? ? ?Co-evaluation   ?  ?  ?  ?  ? ? ?  ?AM-PAC PT "6 Clicks" Mobility  ?Outcome Measure  Help needed turning from your back to your side while in a flat bed without using bedrails?: A Little ?Help needed moving from lying on your back to sitting on the side of a flat bed without using bedrails?: A Little ?Help needed moving to and from a bed to a chair (including a wheelchair)?: A Little ?Help needed standing up from a chair using your arms (e.g., wheelchair or bedside chair)?: A Little ?Help needed to walk in hospital room?: A Little ?Help needed climbing 3-5 steps with a railing? : A Little ?6 Click Score: 18 ? ?  ?End of Session   ?Activity Tolerance: Patient tolerated treatment well ?Patient left: in chair;with call bell/phone within reach ?Nurse Communication: Mobility status ?PT Visit Diagnosis: Muscle weakness (generalized) (M62.81) ?  ? ?Time: 8115-7262 ?PT Time Calculation (min) (ACUTE ONLY): 23 min ? ? ?Charges:   PT Evaluation ?$PT Eval Moderate Complexity: 1 Mod ?PT Treatments ?$Gait Training: 8-22 mins ?  ?   ? ? ?Hanan Moen A. Gilford Rile, PT, DPT ?Acute Rehabilitation Services ?Pager [phone] ?Office [phone] ? ? ?Dalon Reichart A Sargent Mankey ?10/22/2021, 10:20 AM ? ?

## 2021-10-22 NOTE — Progress Notes (Signed)
Patient is discharged from room 3C04 at this time. Alert and in stable condition. IV site d/c'd and instructions read to patient  and son with understanding verbalized and all questions answered. Left unit via wheelchair with all belongings at side. ?

## 2021-11-21 ENCOUNTER — Other Ambulatory Visit: Payer: Self-pay | Admitting: Family Medicine

## 2021-11-23 ENCOUNTER — Encounter: Payer: Self-pay | Admitting: *Deleted

## 2021-11-23 ENCOUNTER — Other Ambulatory Visit: Payer: Self-pay | Admitting: Family Medicine

## 2021-12-07 ENCOUNTER — Encounter: Payer: Self-pay | Admitting: Family Medicine

## 2021-12-08 ENCOUNTER — Other Ambulatory Visit: Payer: Self-pay

## 2021-12-08 MED ORDER — TRAMADOL HCL 50 MG PO TABS: 50.0000 mg | ORAL_TABLET | Freq: Three times a day (TID) | ORAL | 0 refills | Status: DC | PRN

## 2021-12-08 NOTE — Telephone Encounter (Signed)
Requesting:Tramadol '50MG'$  Contract: n/a UDS: n/a Last Visit: 09/29/21 Next Visit: 01/31/22 Last Refill: 10/10/21  Please Advise

## 2021-12-15 NOTE — Progress Notes (Signed)
Office Visit Note  Patient: Rebecca Brooks             Date of Birth: July 17, 1951           MRN: [id number]             PCP: Mosie Lukes, MD Referring: Mosie Lukes, MD Visit Date: 12/28/2021 Occupation: '@GUAROCC'$ @  Subjective:  Pain in multiple joints  History of Present Illness: Rebecca Brooks is a 71 y.o. female with history of osteoarthritis, degenerative disc disease.  She underwent lumbar laminectomy and decompression microdiscectomy by Dr. Trenton Gammon on October 21, 2021.  She has noted improvement after the surgery.  Her right total hip replacement is doing well.  She continues to have discomfort in her right shoulder.  She was evaluated by Dr. Thedore Mins in the past who recommended shoulder replacement.  She continues to have pain and discomfort in her hands due to osteoarthritis.  She also has some discomfort in her left ankle where she had surgery in the past.  She has noticed increased pedal edema recently.  She states despite taking Ultram and ibuprofen she notices discomfort in her joints.  Activities of Daily Living:  Patient reports morning stiffness for 45 minutes.   Patient Reports nocturnal pain.  Difficulty dressing/grooming: Reports Difficulty climbing stairs: Reports Difficulty getting out of chair: Reports Difficulty using hands for taps, buttons, cutlery, and/or writing: Reports  Review of Systems  Constitutional:  Positive for fatigue.  HENT:  Negative for mouth dryness.   Eyes:  Negative for dryness.  Respiratory:  Negative for shortness of breath.   Cardiovascular:  Positive for swelling in legs/feet.  Gastrointestinal:  Negative for constipation.  Endocrine: Positive for cold intolerance.  Genitourinary:  Negative for difficulty urinating.  Musculoskeletal:  Positive for joint pain, gait problem, joint pain, joint swelling, muscle weakness, morning stiffness and muscle tenderness.  Skin:  Negative for rash.  Allergic/Immunologic: Negative for  susceptible to infections.  Neurological:  Positive for numbness and weakness.  Hematological:  Positive for bruising/bleeding tendency.  Psychiatric/Behavioral:  Positive for sleep disturbance.    PMFS History:  Patient Active Problem List   Diagnosis Date Noted   Lumbar stenosis with neurogenic claudication 10/21/2021   Depression 02/23/2021   Hyperglycemia 02/16/2020   Exposure to COVID-19 virus 10/16/2018   Atrophic vaginitis 05/14/2018   OSA (obstructive sleep apnea) 04/26/2018   Muscle cramps 01/02/2018   IDA (iron deficiency anemia) 12/22/2016   Primary osteoarthritis of both knees 10/03/2016   DDD (degenerative disc disease), cervical 10/03/2016   DDD (degenerative disc disease), lumbar 10/03/2016   History of total hip replacement, right 10/03/2016   History of hypothyroidism 10/03/2016   History of hypertension 10/03/2016   History of anemia 10/03/2016   Chondromalacia 10/03/2016   Primary osteoarthritis of shoulder 10/03/2016   Primary osteoarthritis of both hands 10/03/2016   Abnormal nuclear stress test 08/23/2015   Globus sensation 08/13/2015   Special screening for malignant neoplasms, colon 08/13/2015   Chest pain at rest    Chest pain with moderate risk of acute coronary syndrome 07/28/2015   Annual physical exam 07/11/2015   RLS (restless legs syndrome) 11/01/2014   Insomnia 11/01/2014   Thoracic back pain 01/12/2014   Allergic rhinitis 11/03/2013   Preventative health care 12/13/2012   Recurrent epistaxis 02/20/2012   Shoulder pain, bilateral 11/01/2011   Arthralgia 10/18/2011   Peripheral edema 09/10/2011   Asthma 08/19/2011   Palpitations 08/01/2011   Anemia 08/01/2011   Hypokalemia  08/01/2011   Elevated WBC count 08/01/2011   GERD (gastroesophageal reflux disease)    Low back pain radiating to right leg    Class 3 severe obesity with serious comorbidity and body mass index (BMI) of 45.0 to 49.9 in adult [facility]) 07/17/2011   Hypothyroidism  07/17/2011   Hyperlipidemia 07/17/2011   Essential hypertension 07/17/2011    Past Medical History:  Diagnosis Date   Abnormal nuclear stress test    False positive. Cath 08/23/15 showed Angiographically normal coronary arteries   Anemia    Ankle fracture    Arthritis    Asthma    "asthmatic bronchitis"   Back pain    Chicken pox as a child   Edema of both lower extremities    Essential hypertension 07/17/2011   ACEI d/c 06/2011 for refractory cough> resolved    GERD (gastroesophageal reflux disease)    Hyperlipidemia    Hypertension    Hypothyroidism    Insomnia 11/01/2014   Insulin resistance    pt reports she was told this in the past, does not take any medication for this   Joint pain    Low back pain radiating to right leg    intermittent and occasional numbness of skin over right hip in certain positions   Measles as a child   Mumps as a child   Obesity    Osteoarthritis    Palpitation    Pneumonia    this October 2013 from which she has said inhailer   PONV (postoperative nausea and vomiting)    "violent vomiting after surgery"   Recurrent epistaxis 02/20/2012   RLS (restless legs syndrome) 11/01/2014   Skin cancer    Sleep apnea    SOB (shortness of breath) on exertion    Spinal stenosis    Vitamin B 12 deficiency    Vitamin D deficiency     Family History  Problem Relation Age of Onset   Cancer Mother    Heart failure Mother    Lung cancer Mother 35       smoker   Heart Problems Mother        tachycardia   Hearing loss Mother    Anxiety disorder Mother        anxiety, claustrophobia   Obesity Mother    Osteoarthritis Father    Hyperlipidemia Father    Anuerysm Father        AAA   Sudden death Father    Hyperlipidemia Sister    Migraines Sister    Hypertension Sister    Heart Problems Sister        tachycardia   Colon cancer Maternal Grandmother    Heart attack Maternal Grandfather    Aneurysm Paternal Grandfather        abdominal   Heart  disease Paternal Grandfather        AAA rupture, smoker   Past Surgical History:  Procedure Laterality Date   CARDIAC CATHETERIZATION N/A 08/23/2015   Procedure: Left Heart Cath and Coronary Angiography;  Surgeon: [name], MD;  Location: [place] [facility];  Service: Cardiovascular;  Laterality: N/A;   CESAREAN SECTION     X 3   GASTRECTOMY  12/2017   INGUINAL HERNIA REPAIR Right 71 yrs old   LUMBAR LAMINECTOMY/DECOMPRESSION MICRODISCECTOMY Bilateral 10/21/2021   Procedure: Laminectomy and Foraminotomy - bilateral - Lumbar Three-Lumbar Four - Lumbar Four- Lumbar Five;  Surgeon: Earnie Larsson, MD;  Location: [place];  Service: Neurosurgery;  Laterality: Bilateral;   THYROIDECTOMY  total for benign tumor, Parathyroid spared   TONSILLECTOMY     TOTAL HIP ARTHROPLASTY Right 2006   secondary to congenital  hip defect   Social History   Social History Narrative   Not on file   Immunization History  Administered Date(s) Administered   Fluad Quad(high Dose 65+) 04/19/2019   Influenza Split 07/19/2012   Influenza Whole 06/24/2011, 05/13/2013   Influenza, High Dose Seasonal PF 06/22/2017, 05/14/2018   Influenza,inj,Quad PF,6+ Mos 06/03/2016   Influenza-Unspecified 05/07/2014, 06/08/2015   PFIZER(Purple Top)SARS-COV-2 Vaccination 08/12/2019, 09/03/2019, 06/05/2020   Pneumococcal Conjugate-13 05/14/2018   Pneumococcal Polysaccharide-23 07/18/2011   Td 07/24/2005   Tdap 08/01/2011   Zoster, Live 07/23/2012     Objective: Vital Signs: BP 129/69 (BP Location: Left Arm, Patient Position: Sitting, Cuff Size: Small)   Pulse (!) 108   Resp 12   Ht 5' 2.25" (1.581 m)   Wt 265 lb 6.4 oz (120.4 kg)   BMI 48.15 kg/m    Physical Exam Vitals and nursing note reviewed.  Constitutional:      Appearance: She is well-developed.  HENT:     Head: Normocephalic and atraumatic.  Eyes:     Conjunctiva/sclera: Conjunctivae normal.  Cardiovascular:     Rate and Rhythm: Normal rate and  regular rhythm.     Heart sounds: Normal heart sounds.  Pulmonary:     Effort: Pulmonary effort is normal.     Breath sounds: Normal breath sounds.  Abdominal:     General: Bowel sounds are normal.     Palpations: Abdomen is soft.  Musculoskeletal:     Cervical back: Normal range of motion.     Right lower leg: Edema present.     Left lower leg: Edema present.  Lymphadenopathy:     Cervical: No cervical adenopathy.  Skin:    General: Skin is warm and dry.     Capillary Refill: Capillary refill takes less than 2 seconds.  Neurological:     Mental Status: She is alert and oriented to person, place, and time.  Psychiatric:        Behavior: Behavior normal.     Musculoskeletal Exam: C-spine was in good range of motion.  She had limited painful range of motion of the lumbar spine.  Right shoulder abduction was limited to 70 degrees.  Left shoulder joint was in good range of motion.  Elbow joints with good range of motion.  There was no synovitis or tenderness over wrist joints.  She had bilateral PIP and DIP thickening.  She had inflammation in some of her PIP joints.  Hip joints with good range of motion.  She had discomfort range of motion of her bilateral knee joints.  Thickening of the left ankle joint was noted due to prior fracture.  Pedal edema is noted over bilateral lower extremities.  CDAI Exam: CDAI Score: -- Patient Global: --; Provider Global: -- Swollen: --; Tender: -- Joint Exam 12/28/2021   No joint exam has been documented for this visit   There is currently no information documented on the homunculus. Go to the Rheumatology activity and complete the homunculus joint exam.  Investigation: No additional findings.  Imaging: No results found.  Recent Labs: Lab Results  Component Value Date   WBC 9.2 10/21/2021   HGB 14.6 10/21/2021   PLT 422 (H) 10/21/2021   NA 139 10/21/2021   K 3.8 10/21/2021   CL 103 10/21/2021   CO2 26 10/21/2021   GLUCOSE 99 10/21/2021  BUN 15 10/21/2021   CREATININE 0.73 10/21/2021   BILITOT 0.4 01/26/2021   ALKPHOS 101 01/26/2021   AST 19 01/26/2021   ALT 16 01/26/2021   PROT 6.4 01/26/2021   ALBUMIN 4.1 01/26/2021   CALCIUM 9.3 10/21/2021   GFRAA >60 12/21/2017    Speciality Comments: No specialty comments available.  Procedures:  Large Joint Inj: bilateral knee on 12/28/2021 11:17 AM Indications: pain Details: 27 G 1.5 in needle, medial approach  Arthrogram: No  Medications (Right): 1.5 mL lidocaine 1 %; 40 mg triamcinolone acetonide 40 MG/ML Aspirate (Right): 0 mL Medications (Left): 1.5 mL lidocaine 1 %; 40 mg triamcinolone acetonide 40 MG/ML Aspirate (Left): 0 mL Outcome: tolerated well, no immediate complications Procedure, treatment alternatives, risks and benefits explained, specific risks discussed. Consent was given by the patient. Immediately prior to procedure a time out was called to verify the correct patient, procedure, equipment, support staff and site/side marked as required. Patient was prepped and draped in the usual sterile fashion.    Allergies: Bee venom, Accupril [quinapril hcl], Quinapril-hydrochlorothiazide, and Rosuvastatin   Assessment / Plan:     Visit Diagnoses: Primary osteoarthritis of both shoulders-she continues to have pain and discomfort in her bilateral shoulder joints.  She has limited range of motion of her right shoulder joint.  She has been followed by Dr. Thedore Mins.  Primary osteoarthritis of both hands-she has severe osteoarthritis in her bilateral hands with inflammatory component.  She has incomplete fist formation.  Joint protection and muscle strengthening was discussed.  History of total hip replacement, right-she had good range of motion without discomfort.  Primary osteoarthritis of both knees -she has severe end-stage osteoarthritis in bilateral knee joints and severe chondromalacia patella.  She states she gets relief from cortisone injections.  Last  cortisone injections were in December 2022.  She requests cortisone injections today.  After informed consent was obtained bilateral knee joints prepped in sterile fashion injected with lidocaine and Kenalog.  Patient tolerated the procedure well.  Postprocedure instructions were given.  Weight loss diet and exercise was discussed.  Closed nondisplaced fracture of body of left talus, sequela-she continues to have some thickening but no discomfort.  DDD (degenerative disc disease), cervical-she had minimal discomfort with range of motion.  DDD (degenerative disc disease), lumbar - s/p surgery by Dr. Trenton Gammon 09/2021.  She had lumbar laminectomy, decompression and microdiscectomy.  She has noticed improvement in her lower back pain.  Osteopenia of multiple sites - 10/16/2015 DEXA - BMD measured at Femur Neck is 0.866 g/cm2 with a T-score of-1.2.  She plans to discuss repeat DEXA scan with her PCP.  Other medical problems listed as follows:  Peripheral edema-she still had some bilateral pitting edema.  Primary insomnia  History of hypertension-blood pressure was normal today.  History of high cholesterol  Asthmatic bronchitis , chronic (HCC)  History of hypothyroidism  History of anemia  Orders: Orders Placed This Encounter  Procedures   Large Joint Inj   No orders of the defined types were placed in this encounter.    Follow-Up Instructions: Return in about 4 months (around 04/29/2022) for Osteoarthritis.   Bo Merino, MD  Note - This record has been created using Editor, commissioning.  Chart creation errors have been sought, but may not always  have been located. Such creation errors do not reflect on  the standard of medical care.

## 2021-12-20 ENCOUNTER — Other Ambulatory Visit: Payer: Self-pay | Admitting: Family Medicine

## 2021-12-20 DIAGNOSIS — G4700 Insomnia, unspecified: Secondary | ICD-10-CM

## 2021-12-20 DIAGNOSIS — R601 Generalized edema: Secondary | ICD-10-CM

## 2021-12-20 MED ORDER — ZOLPIDEM TARTRATE 10 MG PO TABS: 10.0000 mg | ORAL_TABLET | Freq: Every evening | ORAL | 2 refills | Status: DC | PRN

## 2021-12-20 MED ORDER — FUROSEMIDE 20 MG PO TABS: 20.0000 mg | ORAL_TABLET | Freq: Every day | ORAL | 1 refills | Status: DC | PRN

## 2021-12-20 NOTE — Telephone Encounter (Signed)
Furosemide- Patient comment: I have some that expired in20. Occassionally I need to take this.   Patient comment: I have some but it expired in 2020.  I need some before the weekend.  Requesting: ambien '10mg'$  Contract: 2019 UDS: 2019 Last Visit: last seen you in 2021, but she saw Lovena Le on 09/29/21 for acute visit Next Visit: 01/31/22 Last Refill: 10/01/18  Please Advise

## 2021-12-23 ENCOUNTER — Other Ambulatory Visit: Payer: Self-pay

## 2021-12-23 MED ORDER — CETIRIZINE HCL 10 MG PO TABS
10.0000 mg | ORAL_TABLET | Freq: Every day | ORAL | 1 refills | Status: DC
Start: 2021-12-23 — End: 2022-08-11

## 2021-12-28 ENCOUNTER — Encounter: Payer: Self-pay | Admitting: Rheumatology

## 2021-12-28 ENCOUNTER — Ambulatory Visit: Payer: Medicare Other | Admitting: Rheumatology

## 2021-12-28 VITALS — BP 129/69 | HR 108 | Resp 12 | Ht 62.25 in | Wt 265.4 lb

## 2021-12-28 DIAGNOSIS — M19011 Primary osteoarthritis, right shoulder: Secondary | ICD-10-CM | POA: Diagnosis not present

## 2021-12-28 DIAGNOSIS — M8589 Other specified disorders of bone density and structure, multiple sites: Secondary | ICD-10-CM

## 2021-12-28 DIAGNOSIS — M170 Bilateral primary osteoarthritis of knee: Secondary | ICD-10-CM | POA: Diagnosis not present

## 2021-12-28 DIAGNOSIS — M1712 Unilateral primary osteoarthritis, left knee: Secondary | ICD-10-CM | POA: Diagnosis not present

## 2021-12-28 DIAGNOSIS — F5101 Primary insomnia: Secondary | ICD-10-CM

## 2021-12-28 DIAGNOSIS — M19012 Primary osteoarthritis, left shoulder: Secondary | ICD-10-CM

## 2021-12-28 DIAGNOSIS — M1711 Unilateral primary osteoarthritis, right knee: Secondary | ICD-10-CM

## 2021-12-28 DIAGNOSIS — R609 Edema, unspecified: Secondary | ICD-10-CM

## 2021-12-28 DIAGNOSIS — Z862 Personal history of diseases of the blood and blood-forming organs and certain disorders involving the immune mechanism: Secondary | ICD-10-CM

## 2021-12-28 DIAGNOSIS — J4489 Other specified chronic obstructive pulmonary disease: Secondary | ICD-10-CM

## 2021-12-28 DIAGNOSIS — J449 Chronic obstructive pulmonary disease, unspecified: Secondary | ICD-10-CM

## 2021-12-28 DIAGNOSIS — M19041 Primary osteoarthritis, right hand: Secondary | ICD-10-CM

## 2021-12-28 DIAGNOSIS — R600 Localized edema: Secondary | ICD-10-CM

## 2021-12-28 DIAGNOSIS — S92125S Nondisplaced fracture of body of left talus, sequela: Secondary | ICD-10-CM

## 2021-12-28 DIAGNOSIS — M51369 Other intervertebral disc degeneration, lumbar region without mention of lumbar back pain or lower extremity pain: Secondary | ICD-10-CM

## 2021-12-28 DIAGNOSIS — M5030 Other cervical disc degeneration, unspecified cervical region: Secondary | ICD-10-CM

## 2021-12-28 DIAGNOSIS — M5136 Other intervertebral disc degeneration, lumbar region: Secondary | ICD-10-CM

## 2021-12-28 DIAGNOSIS — Z96641 Presence of right artificial hip joint: Secondary | ICD-10-CM | POA: Diagnosis not present

## 2021-12-28 DIAGNOSIS — Z8639 Personal history of other endocrine, nutritional and metabolic disease: Secondary | ICD-10-CM

## 2021-12-28 DIAGNOSIS — M19042 Primary osteoarthritis, left hand: Secondary | ICD-10-CM

## 2021-12-28 DIAGNOSIS — Z8679 Personal history of other diseases of the circulatory system: Secondary | ICD-10-CM

## 2021-12-28 MED ORDER — LIDOCAINE HCL 1 % IJ SOLN
1.5000 mL | INTRAMUSCULAR | Status: AC | PRN
  Administered 2021-12-28: 1.5 mL

## 2021-12-28 MED ORDER — TRIAMCINOLONE ACETONIDE 40 MG/ML IJ SUSP
40.0000 mg | INTRAMUSCULAR | Status: AC | PRN
  Administered 2021-12-28: 40 mg via INTRA_ARTICULAR

## 2021-12-29 ENCOUNTER — Other Ambulatory Visit: Payer: Self-pay

## 2021-12-29 MED ORDER — ROPINIROLE HCL 1 MG PO TABS
1.0000 mg | ORAL_TABLET | Freq: Every day | ORAL | 0 refills | Status: DC
Start: 2021-12-29 — End: 2022-01-26

## 2022-01-20 DIAGNOSIS — M25511 Pain in right shoulder: Secondary | ICD-10-CM | POA: Diagnosis not present

## 2022-01-26 ENCOUNTER — Other Ambulatory Visit: Payer: Self-pay | Admitting: Family Medicine

## 2022-01-26 ENCOUNTER — Encounter: Payer: Self-pay | Admitting: Family Medicine

## 2022-01-27 ENCOUNTER — Encounter: Payer: Self-pay | Admitting: Family Medicine

## 2022-01-27 DIAGNOSIS — M48062 Spinal stenosis, lumbar region with neurogenic claudication: Secondary | ICD-10-CM | POA: Diagnosis not present

## 2022-01-27 DIAGNOSIS — G4733 Obstructive sleep apnea (adult) (pediatric): Secondary | ICD-10-CM | POA: Diagnosis not present

## 2022-01-27 MED ORDER — VITAMIN D3 25 MCG PO TABS: 1000.0000 [IU] | ORAL_TABLET | Freq: Every day | ORAL | 1 refills | Status: AC

## 2022-01-27 MED ORDER — TRAMADOL HCL 50 MG PO TABS: 50.0000 mg | ORAL_TABLET | Freq: Three times a day (TID) | ORAL | 0 refills | Status: DC | PRN

## 2022-01-27 MED ORDER — ROPINIROLE HCL 1 MG PO TABS: 1.0000 mg | ORAL_TABLET | Freq: Every day | ORAL | 0 refills | Status: DC

## 2022-01-27 NOTE — Telephone Encounter (Signed)
Requesting: tramadol '50mg'$  Contract: n/a UDS: n/a Last Visit: 08/22/21 Next Visit: 01/31/22 Last Refill: 12/08/21 Please Advise

## 2022-01-30 NOTE — Progress Notes (Deleted)
Subjective:    [name], female    DOB: [birth year], 71 y.o.   MRN: [id number]  No chief complaint on file.   HPI Patient is in today for a follow up.  Past Medical History:  Diagnosis Date   Abnormal nuclear stress test    False positive. Cath 08/23/15 showed Angiographically normal coronary arteries   Anemia    Ankle fracture    Arthritis    Asthma    "asthmatic bronchitis"   Back pain    Chicken pox as a child   Edema of both lower extremities    Essential hypertension 07/17/2011   ACEI d/c 06/2011 for refractory cough> resolved    GERD (gastroesophageal reflux disease)    Hyperlipidemia    Hypertension    Hypothyroidism    Insomnia 11/01/2014   Insulin resistance    pt reports she was told this in the past, does not take any medication for this   Joint pain    Low back pain radiating to right leg    intermittent and occasional numbness of skin over right hip in certain positions   Measles as a child   Mumps as a child   Obesity    Osteoarthritis    Palpitation    Pneumonia    this October 2013 from which she has said inhailer   PONV (postoperative nausea and vomiting)    "violent vomiting after surgery"   Recurrent epistaxis 02/20/2012   RLS (restless legs syndrome) 11/01/2014   Skin cancer    Sleep apnea    SOB (shortness of breath) on exertion    Spinal stenosis    Vitamin B 12 deficiency    Vitamin D deficiency     Past Surgical History:  Procedure Laterality Date   CARDIAC CATHETERIZATION N/A 08/23/2015   Procedure: Left Heart Cath and Coronary Angiography;  Surgeon: [name], MD;  Location: [facility];  Service: Cardiovascular;  Laterality: N/A;   CESAREAN SECTION     X 3   GASTRECTOMY  12/2017   INGUINAL HERNIA REPAIR Right 71 yrs old   LUMBAR LAMINECTOMY/DECOMPRESSION MICRODISCECTOMY Bilateral 10/21/2021   Procedure: Laminectomy and Foraminotomy - bilateral - Lumbar Three-Lumbar Four - Lumbar Four- Lumbar  Five;  Surgeon: Earnie Larsson, MD;  Location: [facility];  Service: Neurosurgery;  Laterality: Bilateral;   THYROIDECTOMY     total for benign tumor, Parathyroid spared   TONSILLECTOMY     TOTAL HIP ARTHROPLASTY Right 2006   secondary to congenital  hip defect    Family History  Problem Relation Age of Onset   Cancer Mother    Heart failure Mother    Lung cancer Mother 41       smoker   Heart Problems Mother        tachycardia   Hearing loss Mother    Anxiety disorder Mother        anxiety, claustrophobia   Obesity Mother    Osteoarthritis Father    Hyperlipidemia Father    Anuerysm Father        AAA   Sudden death Father    Hyperlipidemia Sister    Migraines Sister    Hypertension Sister    Heart Problems Sister        tachycardia   Colon cancer Maternal Grandmother    Heart attack Maternal Grandfather    Aneurysm Paternal Grandfather        abdominal   Heart disease Paternal Grandfather  AAA rupture, smoker    Social History   Socioeconomic History   Marital status: Married    Spouse name: Not on file   Number of children: 3   Years of education: Not on file   Highest education level: Not on file  Occupational History   Occupation: Training and development officer   Occupation: Primary school teacher   Occupation: Retired Adult nurse  Tobacco Use   Smoking status: Former    Packs/day: 0.50    Years: 5.00    Total pack years: 2.50    Types: Cigarettes    Quit date: 07/25/1971    Years since quitting: 50.5    Passive exposure: Past   Smokeless tobacco: Never  Vaping Use   Vaping Use: Never used  Substance and Sexual Activity   Alcohol use: Yes    Alcohol/week: 7.0 standard drinks of alcohol    Types: 7 Glasses of wine per week   Drug use: No   Sexual activity: Never    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  Other Topics Concern   Not on file  Social History Narrative   Not on file   Social Determinants of Health   Financial Resource Strain: Low Risk  (08/29/2021)    Overall Financial Resource Strain (CARDIA)    Difficulty of Paying Living Expenses: Not hard at all  Food Insecurity: No Food Insecurity (08/29/2021)   Hunger Vital Sign    Worried About Running Out of Food in the Last Year: Never true    [place] in the Last Year: Never true  Transportation Needs: No Transportation Needs (08/29/2021)   PRAPARE - Hydrologist (Medical): No    Lack of Transportation (Non-Medical): No  Physical Activity: Inactive (08/29/2021)   Exercise Vital Sign    Days of Exercise per Week: 0 days    Minutes of Exercise per Session: 0 min  Stress: No Stress Concern Present (08/29/2021)   [place]    Feeling of Stress : Not at all  Social Connections: [place] (08/29/2021)   Social Connection and Isolation Panel [NHANES]    Frequency of Communication with Friends and Family: Twice a week    Frequency of Social Gatherings with Friends and Family: More than three times a week    Attends Religious Services: More than 4 times per year    Active Member of [employer] or Organizations: Yes    Attends Music therapist: More than 4 times per year    Marital Status: Married  Human resources officer Violence: Not At Risk (08/29/2021)   Humiliation, Afraid, Rape, and Kick questionnaire    Fear of Current or Ex-Partner: No    Emotionally Abused: No    Physically Abused: No    Sexually Abused: No    Outpatient Medications Prior to Visit  Medication Sig Dispense Refill   acetaminophen (TYLENOL) 500 MG tablet Take 1,000 mg by mouth in the morning and at bedtime.     albuterol (VENTOLIN HFA) 108 (90 Base) MCG/ACT inhaler Inhale 1-2 puffs into the lungs every 6 (six) hours as needed for wheezing or shortness of breath. 1 each 5   benzonatate (TESSALON) 200 MG capsule Take 1 capsule (200 mg total) by mouth 3 (three) times daily as needed for cough. 20 capsule 0   cetirizine  (ZYRTEC) 10 MG tablet Take 1 tablet (10 mg total) by mouth daily. 90 tablet 1   clobetasol ointment (TEMOVATE) 0.05 %  SMARTSIG:Sparingly Topical Twice Daily     cyclobenzaprine (FLEXERIL) 10 MG tablet Take 1 tablet (10 mg total) by mouth 3 (three) times daily as needed for muscle spasms. (Patient not taking: Reported on 12/28/2021) 30 tablet 0   diclofenac sodium (VOLTAREN) 1 % GEL APPLY 2-4 GRAMS TOPICALLY TO AFFECTED JOINT UP TO 4 TIMES DAILY 400 g 2   famotidine (PEPCID) 20 MG tablet TAKE 1 TABLET(20 MG) BY MOUTH AT BEDTIME 90 tablet 3   fluticasone (FLONASE) 50 MCG/ACT nasal spray SHAKE LIQUID AND USE 2 SPRAYS IN EACH NOSTRIL DAILY (Patient taking differently: 2 sprays at bedtime. SHAKE LIQUID AND USE 2 SPRAYS IN EACH NOSTRIL DAILY) 48 g 1   furosemide (LASIX) 20 MG tablet Take 1 tablet (20 mg total) by mouth daily as needed for fluid or edema (weight gain > 3#/24 hr). 180 tablet 1   guaiFENesin (MUCINEX) 600 MG 12 hr tablet Take 2 tablets (1,200 mg total) by mouth 2 (two) times daily. (Patient taking differently: Take 1,200 mg by mouth 2 (two) times daily as needed for cough.) 30 tablet 2   hydrochlorothiazide (HYDRODIURIL) 25 MG tablet TAKE 1 TABLET BY MOUTH DAILY 90 tablet 1   HYDROcodone-acetaminophen (NORCO) 10-325 MG tablet Take 2 tablets by mouth every 4 (four) hours as needed for severe pain ((score 7 to 10)). (Patient not taking: Reported on 12/28/2021) 30 tablet 0   levothyroxine (SYNTHROID) 100 MCG tablet TAKE 1 TABLET(100 MCG) BY MOUTH DAILY BEFORE AND BREAKFAST 90 tablet 0   metoprolol tartrate (LOPRESSOR) 50 MG tablet TAKE 1 TABLET(50 MG) BY MOUTH TWICE DAILY 180 tablet 1   pantoprazole (PROTONIX) 40 MG tablet TAKE 1 TABLET(40 MG) BY MOUTH DAILY 90 tablet 3   potassium chloride SA (K-DUR,KLOR-CON) 20 MEQ tablet TAKE 1 TABLET BY MOUTH DAILY AS NEEDED(DEPENDING ON LASIX USE) 90 tablet 3   rOPINIRole (REQUIP) 1 MG tablet Take 1 tablet (1 mg total) by mouth at bedtime. 90 tablet 0    simvastatin (ZOCOR) 40 MG tablet Take 1 tablet (40 mg total) by mouth at bedtime. 90 tablet 3   traMADol (ULTRAM) 50 MG tablet Take 1 tablet (50 mg total) by mouth 3 (three) times daily as needed. 90 tablet 0   Vitamin D3 (VITAMIN D) 25 MCG tablet Take 1 tablet (1,000 Units total) by mouth daily. 60 tablet 1   zolpidem (AMBIEN) 10 MG tablet Take 1 tablet (10 mg total) by mouth at bedtime as needed for sleep. 30 tablet 2   No facility-administered medications prior to visit.    Allergies  Allergen Reactions   Bee Venom Anaphylaxis   Accupril [Quinapril Hcl] Cough   Quinapril-Hydrochlorothiazide Cough   Rosuvastatin Other (See Comments)    Leg aches.    ROS     Objective:    Physical Exam  There were no vitals taken for this visit. Wt Readings from Last 3 Encounters:  12/28/21 265 lb 6.4 oz (120.4 kg)  10/21/21 266 lb (120.7 kg)  08/29/21 261 lb (118.4 kg)    Diabetic Foot Exam - Simple   No data filed    Lab Results  Component Value Date   WBC 9.2 10/21/2021   HGB 14.6 10/21/2021   HCT 44.1 10/21/2021   PLT 422 (H) 10/21/2021   GLUCOSE 99 10/21/2021   CHOL 200 (H) 01/26/2021   TRIG 65 01/26/2021   HDL 83 01/26/2021   LDLCALC 105 (H) 01/26/2021   ALT 16 01/26/2021   AST 19 01/26/2021   NA  139 10/21/2021   K 3.8 10/21/2021   CL 103 10/21/2021   CREATININE 0.73 10/21/2021   BUN 15 10/21/2021   CO2 26 10/21/2021   TSH 1.610 01/26/2021   INR 0.99 08/19/2015   HGBA1C 5.2 01/26/2021    Lab Results  Component Value Date   TSH 1.610 01/26/2021   Lab Results  Component Value Date   WBC 9.2 10/21/2021   HGB 14.6 10/21/2021   HCT 44.1 10/21/2021   MCV 94.4 10/21/2021   PLT 422 (H) 10/21/2021   Lab Results  Component Value Date   NA 139 10/21/2021   K 3.8 10/21/2021   CHLORIDE 103 12/21/2016   CO2 26 10/21/2021   GLUCOSE 99 10/21/2021   BUN 15 10/21/2021   CREATININE 0.73 10/21/2021   BILITOT 0.4 01/26/2021   ALKPHOS 101 01/26/2021   AST 19  01/26/2021   ALT 16 01/26/2021   PROT 6.4 01/26/2021   ALBUMIN 4.1 01/26/2021   CALCIUM 9.3 10/21/2021   ANIONGAP 10 10/21/2021   EGFR 94 01/26/2021   GFR 77.67 02/16/2020   Lab Results  Component Value Date   CHOL 200 (H) 01/26/2021   Lab Results  Component Value Date   HDL 83 01/26/2021   Lab Results  Component Value Date   LDLCALC 105 (H) 01/26/2021   Lab Results  Component Value Date   TRIG 65 01/26/2021   Lab Results  Component Value Date   CHOLHDL 4 02/16/2020   Lab Results  Component Value Date   HGBA1C 5.2 01/26/2021       Assessment & Plan:     Problem List Items Addressed This Visit   None   I am having Zanita C. Racanelli maintain her potassium chloride SA, diclofenac sodium, pantoprazole, simvastatin, guaiFENesin, albuterol, benzonatate, hydrochlorothiazide, fluticasone, famotidine, acetaminophen, cyclobenzaprine, HYDROcodone-acetaminophen, metoprolol tartrate, levothyroxine, furosemide, zolpidem, cetirizine, clobetasol ointment, traMADol, Vitamin D3, and rOPINIRole.  No orders of the defined types were placed in this encounter.

## 2022-01-31 ENCOUNTER — Ambulatory Visit: Payer: Medicare Other | Admitting: Family Medicine

## 2022-01-31 DIAGNOSIS — E782 Mixed hyperlipidemia: Secondary | ICD-10-CM

## 2022-01-31 DIAGNOSIS — E039 Hypothyroidism, unspecified: Secondary | ICD-10-CM

## 2022-01-31 DIAGNOSIS — F3289 Other specified depressive episodes: Secondary | ICD-10-CM

## 2022-01-31 DIAGNOSIS — E559 Vitamin D deficiency, unspecified: Secondary | ICD-10-CM

## 2022-01-31 DIAGNOSIS — Z8679 Personal history of other diseases of the circulatory system: Secondary | ICD-10-CM

## 2022-02-15 DIAGNOSIS — M48062 Spinal stenosis, lumbar region with neurogenic claudication: Secondary | ICD-10-CM | POA: Diagnosis not present

## 2022-02-16 ENCOUNTER — Encounter: Payer: Self-pay | Admitting: Family Medicine

## 2022-02-16 ENCOUNTER — Other Ambulatory Visit: Payer: Self-pay | Admitting: Family Medicine

## 2022-02-16 MED ORDER — PANTOPRAZOLE SODIUM 40 MG PO TBEC
DELAYED_RELEASE_TABLET | ORAL | 1 refills | Status: DC
Start: 2022-02-16 — End: 2022-02-16

## 2022-02-16 MED ORDER — PANTOPRAZOLE SODIUM 40 MG PO TBEC: DELAYED_RELEASE_TABLET | ORAL | 3 refills | Status: DC

## 2022-02-17 DIAGNOSIS — M48062 Spinal stenosis, lumbar region with neurogenic claudication: Secondary | ICD-10-CM | POA: Diagnosis not present

## 2022-02-17 MED ORDER — FAMOTIDINE 20 MG PO TABS: ORAL_TABLET | ORAL | 1 refills | Status: DC

## 2022-02-17 MED ORDER — DICLOFENAC SODIUM 1 % EX GEL: 2.0000 g | Freq: Four times a day (QID) | CUTANEOUS | 5 refills | Status: DC | PRN

## 2022-03-01 ENCOUNTER — Encounter (INDEPENDENT_AMBULATORY_CARE_PROVIDER_SITE_OTHER): Payer: Self-pay

## 2022-03-02 ENCOUNTER — Encounter: Payer: Self-pay | Admitting: Family Medicine

## 2022-03-02 MED ORDER — HYDROCHLOROTHIAZIDE 25 MG PO TABS: 25.0000 mg | ORAL_TABLET | Freq: Every day | ORAL | 0 refills | Status: DC

## 2022-03-02 MED ORDER — LEVOTHYROXINE SODIUM 100 MCG PO TABS: ORAL_TABLET | ORAL | 0 refills | Status: DC

## 2022-03-02 NOTE — Telephone Encounter (Signed)
Pt has been scheduled for follow up appt

## 2022-03-03 DIAGNOSIS — M48062 Spinal stenosis, lumbar region with neurogenic claudication: Secondary | ICD-10-CM | POA: Diagnosis not present

## 2022-03-08 DIAGNOSIS — M48062 Spinal stenosis, lumbar region with neurogenic claudication: Secondary | ICD-10-CM | POA: Diagnosis not present

## 2022-03-09 DIAGNOSIS — G4733 Obstructive sleep apnea (adult) (pediatric): Secondary | ICD-10-CM | POA: Diagnosis not present

## 2022-03-10 ENCOUNTER — Encounter (HOSPITAL_BASED_OUTPATIENT_CLINIC_OR_DEPARTMENT_OTHER): Payer: Self-pay

## 2022-03-10 ENCOUNTER — Other Ambulatory Visit: Payer: Self-pay

## 2022-03-10 DIAGNOSIS — M79671 Pain in right foot: Secondary | ICD-10-CM | POA: Diagnosis not present

## 2022-03-10 DIAGNOSIS — M48062 Spinal stenosis, lumbar region with neurogenic claudication: Secondary | ICD-10-CM | POA: Diagnosis not present

## 2022-03-10 NOTE — ED Triage Notes (Signed)
Patient here POV from Home.  Endorses Sudden Cramping Like Sensation that began at 1800 today that worsened since it began. Located to Lateral Aspect of Right Foot.  No Known Trauma or Injury.   NAD Noted during Triage. A&Ox4. GCS 15. Ambulatory.

## 2022-03-11 ENCOUNTER — Emergency Department (HOSPITAL_BASED_OUTPATIENT_CLINIC_OR_DEPARTMENT_OTHER): Payer: Medicare Other

## 2022-03-11 ENCOUNTER — Emergency Department (HOSPITAL_BASED_OUTPATIENT_CLINIC_OR_DEPARTMENT_OTHER)
Admission: EM | Admit: 2022-03-11 | Discharge: 2022-03-11 | Disposition: A | Discharge status: Home / Self Care | Payer: Medicare Other | Attending: Emergency Medicine | Admitting: Emergency Medicine

## 2022-03-11 DIAGNOSIS — M79671 Pain in right foot: Secondary | ICD-10-CM | POA: Diagnosis not present

## 2022-03-11 MED ORDER — HYDROCODONE-ACETAMINOPHEN 5-325 MG PO TABS
1.0000 | ORAL_TABLET | Freq: Once | ORAL | Status: AC
  Administered 2022-03-11: 1 via ORAL
  Filled 2022-03-11: qty 1

## 2022-03-11 NOTE — ED Provider Notes (Signed)
[facility]  Provider Note  CSN: [id number] Arrival date & time: 03/10/22 2117  History Chief Complaint  Patient presents with   Foot Pain    Rebecca Brooks is a 71 y.o. female with history of osteoarthritis and lumbar disease s/p laminectomy earlier this year reports sudden onset of sharp, severe R foot pain earlier today while walking from her car. No falls or injuries. No radicular pain from her lower back. She has had improved pain with rest but still severe with weight bearing. No prior history of gout.    Home Medications Prior to Admission medications   Medication Sig Start Date End Date Taking? Authorizing Provider  acetaminophen (TYLENOL) 500 MG tablet Take 1,000 mg by mouth in the morning and at bedtime.    [provider]  albuterol (VENTOLIN HFA) 108 (90 Base) MCG/ACT inhaler Inhale 1-2 puffs into the lungs every 6 (six) hours as needed for wheezing or shortness of breath. 08/22/21   Terrilyn Saver, NP  benzonatate (TESSALON) 200 MG capsule Take 1 capsule (200 mg total) by mouth 3 (three) times daily as needed for cough. 08/26/21   Mosie Lukes, MD  cetirizine (ZYRTEC) 10 MG tablet Take 1 tablet (10 mg total) by mouth daily. 12/23/21   Mosie Lukes, MD  clobetasol ointment (TEMOVATE) 0.05 % SMARTSIG:Sparingly Topical Twice Daily 09/01/21   [provider]  cyclobenzaprine (FLEXERIL) 10 MG tablet Take 1 tablet (10 mg total) by mouth 3 (three) times daily as needed for muscle spasms. Patient not taking: Reported on 12/28/2021 10/22/21   Dawley, Troy C, DO  diclofenac Sodium (VOLTAREN) 1 % GEL Apply 2-4 g topically 4 (four) times daily as needed. 02/17/22   Mosie Lukes, MD  famotidine (PEPCID) 20 MG tablet TAKE 1 TABLET(20 MG) BY MOUTH AT BEDTIME 02/17/22   Mosie Lukes, MD  fluticasone (FLONASE) 50 MCG/ACT nasal spray SHAKE LIQUID AND USE 2 SPRAYS IN EACH NOSTRIL DAILY Patient taking differently: 2 sprays at bedtime. SHAKE  LIQUID AND USE 2 SPRAYS IN [facility] NOSTRIL DAILY 09/07/21   Mosie Lukes, MD  furosemide (LASIX) 20 MG tablet Take 1 tablet (20 mg total) by mouth daily as needed for fluid or edema (weight gain > 3#/24 hr). 12/20/21   Mosie Lukes, MD  guaiFENesin (MUCINEX) 600 MG 12 hr tablet Take 2 tablets (1,200 mg total) by mouth 2 (two) times daily. Patient taking differently: Take 1,200 mg by mouth 2 (two) times daily as needed for cough. 08/22/21   Terrilyn Saver, NP  hydrochlorothiazide (HYDRODIURIL) 25 MG tablet Take 1 tablet (25 mg total) by mouth daily. 03/02/22   Mosie Lukes, MD  HYDROcodone-acetaminophen (NORCO) 10-325 MG tablet Take 2 tablets by mouth every 4 (four) hours as needed for severe pain ((score 7 to 10)). Patient not taking: Reported on 12/28/2021 10/22/21   Dawley, Troy C, DO  levothyroxine (SYNTHROID) 100 MCG tablet TAKE 1 TABLET(100 MCG) BY MOUTH DAILY BEFORE AND BREAKFAST 03/02/22   Mosie Lukes, MD  metoprolol tartrate (LOPRESSOR) 50 MG tablet TAKE 1 TABLET(50 MG) BY MOUTH TWICE DAILY 11/21/21   Mosie Lukes, MD  pantoprazole (PROTONIX) 40 MG tablet TAKE 1 TABLET(40 MG) BY MOUTH DAILY 02/16/22   Mosie Lukes, MD  potassium chloride SA (K-DUR,KLOR-CON) 20 MEQ tablet TAKE 1 TABLET BY MOUTH DAILY AS NEEDED(DEPENDING ON LASIX USE) 11/27/16   Mosie Lukes, MD  rOPINIRole (REQUIP) 1 MG tablet Take 1 tablet (1  mg total) by mouth at bedtime. 01/27/22   Mosie Lukes, MD  simvastatin (ZOCOR) 40 MG tablet Take 1 tablet (40 mg total) by mouth at bedtime. 08/10/21   Mosie Lukes, MD  traMADol (ULTRAM) 50 MG tablet Take 1 tablet (50 mg total) by mouth 3 (three) times daily as needed. 01/27/22   Mosie Lukes, MD  Vitamin D3 (VITAMIN D) 25 MCG tablet Take 1 tablet (1,000 Units total) by mouth daily. 01/27/22   Mosie Lukes, MD  zolpidem (AMBIEN) 10 MG tablet Take 1 tablet (10 mg total) by mouth at bedtime as needed for sleep. 12/20/21   Mosie Lukes, MD     Allergies    Bee venom,  Accupril [quinapril hcl], Quinapril-hydrochlorothiazide, and Rosuvastatin   Review of Systems   Review of Systems Please see HPI for pertinent positives and negatives  Physical Exam BP (!) 150/83   Pulse 95   Temp 97.9 F (36.6 C) (Oral)   Resp 16   Ht 5' 2.25" (1.581 m)   Wt 115.7 kg   SpO2 97%   BMI 46.27 kg/m   Physical Exam Vitals and nursing note reviewed.  HENT:     Head: Normocephalic.     Nose: Nose normal.  Eyes:     Extraocular Movements: Extraocular movements intact.  Cardiovascular:     Pulses: Normal pulses.  Pulmonary:     Effort: Pulmonary effort is normal.  Musculoskeletal:        General: Tenderness (R lateral foot) present. Normal range of motion.     Cervical back: Neck supple.     Comments: No erythema warmth or induration to suggest infection or inflammatory arthritis.   Skin:    Findings: No rash (on exposed skin).  Neurological:     Mental Status: She is alert and oriented to person, place, and time.  Psychiatric:        Mood and Affect: Mood normal.     ED Results / Procedures / Treatments   EKG None  Procedures Procedures  Medications Ordered in the ED Medications  HYDROcodone-acetaminophen (NORCO/VICODIN) 5-325 MG per tablet 1 tablet (has no administration in time range)    Initial Impression and Plan  Patient with atraumatic R foot pain. No exam signs of gout. Pulse is normal. I personally viewed the images from radiology studies and agree with radiologist interpretation: XRay shows arthritis but no other acute findings. Recommend cam walker for comfort. Pain meds she has at home. Ortho follow up.    ED Course       MDM Rules/Calculators/A&P Medical Decision Making Problems Addressed: Right foot pain: acute illness or injury  Amount and/or Complexity of Data Reviewed Radiology: ordered and independent interpretation performed. Decision-making details documented in ED Course.  Risk Prescription drug  management.    Final Clinical Impression(s) / ED Diagnoses Final diagnoses:  Right foot pain    Rx / DC Orders ED Discharge Orders     None        Truddie Hidden, MD 03/11/22 [phone]

## 2022-03-13 DIAGNOSIS — M25571 Pain in right ankle and joints of right foot: Secondary | ICD-10-CM | POA: Diagnosis not present

## 2022-03-17 ENCOUNTER — Other Ambulatory Visit: Payer: Self-pay | Admitting: Family Medicine

## 2022-03-17 MED ORDER — TRAMADOL HCL 50 MG PO TABS: 50.0000 mg | ORAL_TABLET | Freq: Three times a day (TID) | ORAL | 0 refills | Status: DC | PRN

## 2022-03-27 NOTE — Assessment & Plan Note (Signed)
Encouraged moist heat and gentle stretching as tolerated. May try NSAIDs and prescription meds as directed and report if symptoms worsen or seek immediate care 

## 2022-03-27 NOTE — Assessment & Plan Note (Signed)
Encourage heart healthy diet such as MIND or DASH diet, increase exercise, avoid trans fats, simple carbohydrates and processed foods, consider a krill or fish or flaxseed oil cap daily. Tolerating Simvastatin 

## 2022-03-27 NOTE — Assessment & Plan Note (Signed)
hgba1c acceptable, minimize simple carbs. Increase exercise as tolerated.  

## 2022-03-27 NOTE — Assessment & Plan Note (Signed)
Well controlled, no changes to meds. Encouraged heart healthy diet such as the DASH diet and exercise as tolerated.  °

## 2022-03-28 ENCOUNTER — Ambulatory Visit (INDEPENDENT_AMBULATORY_CARE_PROVIDER_SITE_OTHER): Payer: Medicare Other | Admitting: Family Medicine

## 2022-03-28 VITALS — BP 132/78 | HR 80 | Temp 97.9°F | Resp 16 | Ht 62.0 in | Wt 263.0 lb

## 2022-03-28 DIAGNOSIS — Z780 Asymptomatic menopausal state: Secondary | ICD-10-CM

## 2022-03-28 DIAGNOSIS — E039 Hypothyroidism, unspecified: Secondary | ICD-10-CM

## 2022-03-28 DIAGNOSIS — E782 Mixed hyperlipidemia: Secondary | ICD-10-CM

## 2022-03-28 DIAGNOSIS — M109 Gout, unspecified: Secondary | ICD-10-CM

## 2022-03-28 DIAGNOSIS — R011 Cardiac murmur, unspecified: Secondary | ICD-10-CM

## 2022-03-28 DIAGNOSIS — M8580 Other specified disorders of bone density and structure, unspecified site: Secondary | ICD-10-CM

## 2022-03-28 DIAGNOSIS — I10 Essential (primary) hypertension: Secondary | ICD-10-CM | POA: Diagnosis not present

## 2022-03-28 DIAGNOSIS — M79671 Pain in right foot: Secondary | ICD-10-CM

## 2022-03-28 DIAGNOSIS — E2839 Other primary ovarian failure: Secondary | ICD-10-CM

## 2022-03-28 DIAGNOSIS — R739 Hyperglycemia, unspecified: Secondary | ICD-10-CM

## 2022-03-28 DIAGNOSIS — Z1231 Encounter for screening mammogram for malignant neoplasm of breast: Secondary | ICD-10-CM

## 2022-03-28 DIAGNOSIS — M48062 Spinal stenosis, lumbar region with neurogenic claudication: Secondary | ICD-10-CM

## 2022-03-28 NOTE — Patient Instructions (Addendum)
Bone density shows osteopenia, which is thinner than normal but not as bad as osteoporosis. Recommend calcium intake of 1200 to 1500 mg daily, divided into roughly 3 doses. Best source is the diet and a single dairy serving is about 500 mg, a supplement of calcium citrate once or twice daily to balance diet is fine if not getting enough in diet. Also need Vitamin D 2000 IU caps, 1 cap daily if not already taking vitamin D. Also recommend weight baring exercise on hips and upper body to keep bones strong   RSV (respiratory syncitial virus) vaccine at pharmacy Covid booster when new version out late September At pharmacy High dose flu shot mid Sept to mid Oct   Shingrix is the new shingles shot, 2 shots over 2-6 months, confirm coverage with insurance and document, then can return here for shots with nurse appt or at pharmacy

## 2022-03-28 NOTE — Progress Notes (Signed)
Subjective:   By signing my name below, I, Kellie Simmering, attest that this documentation has been prepared under the direction and in the presence of Mosie Lukes, MD 03/28/2022.    [name], female    DOB: 1950/11/25, 71 y.o.   MRN: [id number]  No chief complaint on file.  HPI Patient is in today for an office visit.  Right foot fracture: She reports that on 03/10/2022 she fractured her right foot while walking from her car. She visited the ER on the night of injury and then later saw an orthopedic surgeon who confirmed that her foot is fractured. She states that she has been taking Celebrex 200 mg twice a day and it has been effective at managing her pain. She has also been taking Tramadol 50 mg three times a day.   Mammogram: She is interested in receiving an order for a mammogram.   Vitamin D: She is currently taking Vitamin D3 1000 IU daily.   Immunizations: She is due for Shingles immunizations. She has been informed about receiving COVID-19, high-dose Flu, and RSV immunizations.   Past Medical History:  Diagnosis Date   Abnormal nuclear stress test    False positive. Cath 08/23/15 showed Angiographically normal coronary arteries   Anemia    Ankle fracture    Arthritis    Asthma    "asthmatic bronchitis"   Back pain    Chicken pox as a child   Edema of both lower extremities    Essential hypertension 07/17/2011   ACEI d/c 06/2011 for refractory cough> resolved    GERD (gastroesophageal reflux disease)    Hyperlipidemia    Hypertension    Hypothyroidism    Insomnia 11/01/2014   Insulin resistance    pt reports she was told this in the past, does not take any medication for this   Joint pain    Low back pain radiating to right leg    intermittent and occasional numbness of skin over right hip in certain positions   Measles as a child   Mumps as a child   Obesity    Osteoarthritis    Palpitation    Pneumonia    this October 2013 from  which she has said inhailer   PONV (postoperative nausea and vomiting)    "violent vomiting after surgery"   Recurrent epistaxis 02/20/2012   RLS (restless legs syndrome) 11/01/2014   Skin cancer    Sleep apnea    SOB (shortness of breath) on exertion    Spinal stenosis    Vitamin B 12 deficiency    Vitamin D deficiency    Past Surgical History:  Procedure Laterality Date   CARDIAC CATHETERIZATION N/A 08/23/2015   Procedure: Left Heart Cath and Coronary Angiography;  Surgeon: [name], MD;  Location: [facility];  Service: Cardiovascular;  Laterality: N/A;   CESAREAN SECTION     X 3   GASTRECTOMY  12/2017   INGUINAL HERNIA REPAIR Right 71 yrs old   LUMBAR LAMINECTOMY/DECOMPRESSION MICRODISCECTOMY Bilateral 10/21/2021   Procedure: Laminectomy and Foraminotomy - bilateral - Lumbar Three-Lumbar Four - Lumbar Four- Lumbar Five;  Surgeon: Earnie Larsson, MD;  Location: [facility];  Service: Neurosurgery;  Laterality: Bilateral;   THYROIDECTOMY     total for benign tumor, Parathyroid spared   TONSILLECTOMY     TOTAL HIP ARTHROPLASTY Right 2006   secondary to congenital  hip defect   Family History  Problem Relation Age of Onset  Cancer Mother    Heart failure Mother    Lung cancer Mother 24       smoker   Heart Problems Mother        tachycardia   Hearing loss Mother    Anxiety disorder Mother        anxiety, claustrophobia   Obesity Mother    Osteoarthritis Father    Hyperlipidemia Father    Anuerysm Father        AAA   Sudden death Father    Hyperlipidemia Sister    Migraines Sister    Hypertension Sister    Heart Problems Sister        tachycardia   Colon cancer Maternal Grandmother    Heart attack Maternal Grandfather    Aneurysm Paternal Grandfather        abdominal   Heart disease Paternal Grandfather        AAA rupture, smoker   Social History   Socioeconomic History   Marital status: Married    Spouse name: Not on file   Number of children: 3    Years of education: Not on file   Highest education level: Not on file  Occupational History   Occupation: Training and development officer   Occupation: Primary school teacher   Occupation: Retired Adult nurse  Tobacco Use   Smoking status: Former    Packs/day: 0.50    Years: 5.00    Total pack years: 2.50    Types: Cigarettes    Quit date: 07/25/1971    Years since quitting: 50.7    Passive exposure: Past   Smokeless tobacco: Never  Vaping Use   Vaping Use: Never used  Substance and Sexual Activity   Alcohol use: Yes    Alcohol/week: 7.0 standard drinks of alcohol    Types: 7 Glasses of wine per week   Drug use: No   Sexual activity: Never    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  Other Topics Concern   Not on file  Social History Narrative   Not on file   Social Determinants of Health   Financial Resource Strain: Low Risk  (08/29/2021)   Overall Financial Resource Strain (CARDIA)    Difficulty of Paying Living Expenses: Not hard at all  Food Insecurity: No Food Insecurity (08/29/2021)   Hunger Vital Sign    Worried About Running Out of Food in the Last Year: Never true    [place] in the Last Year: Never true  Transportation Needs: No Transportation Needs (08/29/2021)   PRAPARE - Hydrologist (Medical): No    Lack of Transportation (Non-Medical): No  Physical Activity: Inactive (08/29/2021)   Exercise Vital Sign    Days of Exercise per Week: 0 days    Minutes of Exercise per Session: 0 min  Stress: No Stress Concern Present (08/29/2021)   [place]    Feeling of Stress : Not at all  Social Connections: [place] (08/29/2021)   Social Connection and Isolation Panel [NHANES]    Frequency of Communication with Friends and Family: Twice a week    Frequency of Social Gatherings with Friends and Family: More than three times a week    Attends Religious Services: More than 4 times per year     Active Member of [employer] or Organizations: Yes    Attends Music therapist: More than 4 times per year    Marital Status: Married  Human resources officer  Violence: Not At Risk (08/29/2021)   Humiliation, Afraid, Rape, and Kick questionnaire    Fear of Current or Ex-Partner: No    Emotionally Abused: No    Physically Abused: No    Sexually Abused: No   Outpatient Medications Prior to Visit  Medication Sig Dispense Refill   acetaminophen (TYLENOL) 500 MG tablet Take 1,000 mg by mouth in the morning and at bedtime.     albuterol (VENTOLIN HFA) 108 (90 Base) MCG/ACT inhaler Inhale 1-2 puffs into the lungs every 6 (six) hours as needed for wheezing or shortness of breath. 1 each 5   benzonatate (TESSALON) 200 MG capsule Take 1 capsule (200 mg total) by mouth 3 (three) times daily as needed for cough. 20 capsule 0   cetirizine (ZYRTEC) 10 MG tablet Take 1 tablet (10 mg total) by mouth daily. 90 tablet 1   clobetasol ointment (TEMOVATE) 0.05 % SMARTSIG:Sparingly Topical Twice Daily     cyclobenzaprine (FLEXERIL) 10 MG tablet Take 1 tablet (10 mg total) by mouth 3 (three) times daily as needed for muscle spasms. (Patient not taking: Reported on 12/28/2021) 30 tablet 0   diclofenac Sodium (VOLTAREN) 1 % GEL Apply 2-4 g topically 4 (four) times daily as needed. 350 g 5   famotidine (PEPCID) 20 MG tablet TAKE 1 TABLET(20 MG) BY MOUTH AT BEDTIME 90 tablet 1   fluticasone (FLONASE) 50 MCG/ACT nasal spray SHAKE LIQUID AND USE 2 SPRAYS IN EACH NOSTRIL DAILY (Patient taking differently: 2 sprays at bedtime. SHAKE LIQUID AND USE 2 SPRAYS IN EACH NOSTRIL DAILY) 48 g 1   furosemide (LASIX) 20 MG tablet Take 1 tablet (20 mg total) by mouth daily as needed for fluid or edema (weight gain > 3#/24 hr). 180 tablet 1   guaiFENesin (MUCINEX) 600 MG 12 hr tablet Take 2 tablets (1,200 mg total) by mouth 2 (two) times daily. (Patient taking differently: Take 1,200 mg by mouth 2 (two) times daily as needed for cough.)  30 tablet 2   hydrochlorothiazide (HYDRODIURIL) 25 MG tablet Take 1 tablet (25 mg total) by mouth daily. 90 tablet 0   HYDROcodone-acetaminophen (NORCO) 10-325 MG tablet Take 2 tablets by mouth every 4 (four) hours as needed for severe pain ((score 7 to 10)). (Patient not taking: Reported on 12/28/2021) 30 tablet 0   levothyroxine (SYNTHROID) 100 MCG tablet TAKE 1 TABLET(100 MCG) BY MOUTH DAILY BEFORE AND BREAKFAST 90 tablet 0   metoprolol tartrate (LOPRESSOR) 50 MG tablet TAKE 1 TABLET(50 MG) BY MOUTH TWICE DAILY 180 tablet 1   pantoprazole (PROTONIX) 40 MG tablet TAKE 1 TABLET(40 MG) BY MOUTH DAILY 90 tablet 3   potassium chloride SA (K-DUR,KLOR-CON) 20 MEQ tablet TAKE 1 TABLET BY MOUTH DAILY AS NEEDED(DEPENDING ON LASIX USE) 90 tablet 3   rOPINIRole (REQUIP) 1 MG tablet Take 1 tablet (1 mg total) by mouth at bedtime. 90 tablet 0   simvastatin (ZOCOR) 40 MG tablet Take 1 tablet (40 mg total) by mouth at bedtime. 90 tablet 3   traMADol (ULTRAM) 50 MG tablet Take 1 tablet (50 mg total) by mouth 3 (three) times daily as needed. 90 tablet 0   Vitamin D3 (VITAMIN D) 25 MCG tablet Take 1 tablet (1,000 Units total) by mouth daily. 60 tablet 1   zolpidem (AMBIEN) 10 MG tablet Take 1 tablet (10 mg total) by mouth at bedtime as needed for sleep. 30 tablet 2   No facility-administered medications prior to visit.   Allergies  Allergen Reactions   Bee  Venom Anaphylaxis   Accupril [Quinapril Hcl] Cough   Quinapril-Hydrochlorothiazide Cough   Rosuvastatin Other (See Comments)    Leg aches.   ROS     Objective:    Physical Exam Constitutional:      General: She is not in acute distress.    Appearance: Normal appearance. She is not ill-appearing.  HENT:     Head: Normocephalic and atraumatic.     Right Ear: External ear normal.     Left Ear: External ear normal.     Mouth/Throat:     Mouth: Mucous membranes are moist.     Pharynx: Oropharynx is clear.  Eyes:     Extraocular Movements:  Extraocular movements intact.     Pupils: Pupils are equal, round, and reactive to light.  Cardiovascular:     Rate and Rhythm: Normal rate and regular rhythm.     Pulses: Normal pulses.     Heart sounds: Murmur heard.     No gallop.  Pulmonary:     Effort: Pulmonary effort is normal. No respiratory distress.     Breath sounds: Normal breath sounds. No wheezing or rales.  Abdominal:     General: Bowel sounds are normal.  Skin:    General: Skin is warm and dry.  Neurological:     Mental Status: She is alert and oriented to person, place, and time.  Psychiatric:        Mood and Affect: Mood normal.        Behavior: Behavior normal.        Judgment: Judgment normal.    There were no vitals taken for this visit. Wt Readings from Last 3 Encounters:  03/10/22 255 lb (115.7 kg)  12/28/21 265 lb 6.4 oz (120.4 kg)  10/21/21 266 lb (120.7 kg)   Diabetic Foot Exam - Simple   No data filed    Lab Results  Component Value Date   WBC 9.2 10/21/2021   HGB 14.6 10/21/2021   HCT 44.1 10/21/2021   PLT 422 (H) 10/21/2021   GLUCOSE 99 10/21/2021   CHOL 200 (H) 01/26/2021   TRIG 65 01/26/2021   HDL 83 01/26/2021   LDLCALC 105 (H) 01/26/2021   ALT 16 01/26/2021   AST 19 01/26/2021   NA 139 10/21/2021   K 3.8 10/21/2021   CL 103 10/21/2021   CREATININE 0.73 10/21/2021   BUN 15 10/21/2021   CO2 26 10/21/2021   TSH 1.610 01/26/2021   INR 0.99 08/19/2015   HGBA1C 5.2 01/26/2021   Lab Results  Component Value Date   TSH 1.610 01/26/2021   Lab Results  Component Value Date   WBC 9.2 10/21/2021   HGB 14.6 10/21/2021   HCT 44.1 10/21/2021   MCV 94.4 10/21/2021   PLT 422 (H) 10/21/2021   Lab Results  Component Value Date   NA 139 10/21/2021   K 3.8 10/21/2021   CHLORIDE 103 12/21/2016   CO2 26 10/21/2021   GLUCOSE 99 10/21/2021   BUN 15 10/21/2021   CREATININE 0.73 10/21/2021   BILITOT 0.4 01/26/2021   ALKPHOS 101 01/26/2021   AST 19 01/26/2021   ALT 16 01/26/2021    PROT 6.4 01/26/2021   ALBUMIN 4.1 01/26/2021   CALCIUM 9.3 10/21/2021   ANIONGAP 10 10/21/2021   EGFR 94 01/26/2021   GFR 77.67 02/16/2020   Lab Results  Component Value Date   CHOL 200 (H) 01/26/2021   Lab Results  Component Value Date   HDL 83 01/26/2021  Lab Results  Component Value Date   LDLCALC 105 (H) 01/26/2021   Lab Results  Component Value Date   TRIG 65 01/26/2021   Lab Results  Component Value Date   CHOLHDL 4 02/16/2020   Lab Results  Component Value Date   HGBA1C 5.2 01/26/2021      Assessment & Plan:   Problem List Items Addressed This Visit       Cardiovascular and Mediastinum   Essential hypertension     Other   Hyperlipidemia   Hyperglycemia   Lumbar stenosis with neurogenic claudication   No orders of the defined types were placed in this encounter.  I, Kellie Simmering, personally preformed the services described in this documentation.  All medical record entries made by the scribe were at my direction and in my presence.  I have reviewed the chart and discharge instructions (if applicable) and agree that the record reflects my personal performance and is accurate and complete. 03/28/2022  I,Mohammed Iqbal,acting as a scribe for Penni Homans, MD.,have documented all relevant documentation on the behalf of Penni Homans, MD,as directed by  Penni Homans, MD while in the presence of Penni Homans, MD.  Kellie Simmering

## 2022-03-29 DIAGNOSIS — M79673 Pain in unspecified foot: Secondary | ICD-10-CM | POA: Insufficient documentation

## 2022-03-29 DIAGNOSIS — M8580 Other specified disorders of bone density and structure, unspecified site: Secondary | ICD-10-CM | POA: Insufficient documentation

## 2022-03-29 DIAGNOSIS — R011 Cardiac murmur, unspecified: Secondary | ICD-10-CM | POA: Insufficient documentation

## 2022-03-29 DIAGNOSIS — M109 Gout, unspecified: Secondary | ICD-10-CM | POA: Insufficient documentation

## 2022-03-29 LAB — LIPID PANEL
Cholesterol: 187 mg/dL (ref 0–200)
HDL: 71.4 mg/dL (ref >39.00)
LDL Cholesterol: 90 mg/dL (ref 0–99)
NonHDL: 115.18
Total CHOL/HDL Ratio: 3
Triglycerides: 128 mg/dL (ref 0.0–149.0)
VLDL: 25.6 mg/dL (ref 0.0–40.0)

## 2022-03-29 LAB — CBC
HCT: 39.9 % (ref 36.0–46.0)
Hemoglobin: 13.1 g/dL (ref 12.0–15.0)
MCHC: 32.9 g/dL (ref 30.0–36.0)
MCV: 90.4 fl (ref 78.0–100.0)
Platelets: 345 10*3/uL (ref 150.0–400.0)
RBC: 4.41 Mil/uL (ref 3.87–5.11)
RDW: 14.2 % (ref 11.5–15.5)
WBC: 9.2 10*3/uL (ref 4.0–10.5)

## 2022-03-29 LAB — COMPREHENSIVE METABOLIC PANEL
ALT: 18 U/L (ref 0–35)
AST: 21 U/L (ref 0–37)
Albumin: 3.9 g/dL (ref 3.5–5.2)
Alkaline Phosphatase: 87 U/L (ref 39–117)
BUN: 24 mg/dL — ABNORMAL HIGH (ref 6–23)
CO2: 30 mEq/L (ref 19–32)
Calcium: 9.6 mg/dL (ref 8.4–10.5)
Chloride: 102 mEq/L (ref 96–112)
Creatinine, Ser: 0.85 mg/dL (ref 0.40–1.20)
GFR: 68.77 mL/min (ref >60.00)
Glucose, Bld: 88 mg/dL (ref 70–99)
Potassium: 4.3 mEq/L (ref 3.5–5.1)
Sodium: 141 mEq/L (ref 135–145)
Total Bilirubin: 0.5 mg/dL (ref 0.2–1.2)
Total Protein: 6.9 g/dL (ref 6.0–8.3)

## 2022-03-29 LAB — HEMOGLOBIN A1C: Hgb A1c MFr Bld: 5.7 % (ref 4.6–6.5)

## 2022-03-29 LAB — URIC ACID: Uric Acid, Serum: 6.1 mg/dL (ref 2.4–7.0)

## 2022-03-29 LAB — TSH: TSH: 0.89 u[IU]/mL (ref 0.35–5.50)

## 2022-03-29 NOTE — Assessment & Plan Note (Signed)
Bone density shows osteopenia, which is thinner than normal but not as bad as osteoporosis. Recommend calcium intake of 1200 to 1500 mg daily, divided into roughly 3 doses. Best source is the diet and a single dairy serving is about 500 mg, a supplement of calcium citrate once or twice daily to balance diet is fine if not getting enough in diet. Also need Vitamin D 2000 IU caps, 1 cap daily if not already taking vitamin D. Also recommend weight baring exercise on hips and upper body to keep bones strong, repeat Dexa scan

## 2022-03-29 NOTE — Assessment & Plan Note (Signed)
On Levothyroxine, continue to monitor 

## 2022-03-29 NOTE — Assessment & Plan Note (Signed)
She fractured her foot on August 18 and is doing very well now, she is following with ortho and her pain is improving. Has used Celebrex and Tramadol to manage her pain to date.

## 2022-03-29 NOTE — Assessment & Plan Note (Signed)
[facility] ordered

## 2022-03-29 NOTE — Assessment & Plan Note (Signed)
Hydrate and monitor 

## 2022-03-29 NOTE — Assessment & Plan Note (Signed)
Repeat cardiac echo

## 2022-03-31 DIAGNOSIS — M48062 Spinal stenosis, lumbar region with neurogenic claudication: Secondary | ICD-10-CM | POA: Diagnosis not present

## 2022-04-04 ENCOUNTER — Encounter: Payer: Self-pay | Admitting: Family Medicine

## 2022-04-04 ENCOUNTER — Other Ambulatory Visit: Payer: Self-pay | Admitting: Family Medicine

## 2022-04-04 MED ORDER — CELECOXIB 100 MG PO CAPS: 100.0000 mg | ORAL_CAPSULE | Freq: Two times a day (BID) | ORAL | 2 refills | Status: DC | PRN

## 2022-04-10 ENCOUNTER — Ambulatory Visit (HOSPITAL_COMMUNITY): Payer: Medicare Other | Attending: Family Medicine

## 2022-04-10 DIAGNOSIS — R011 Cardiac murmur, unspecified: Secondary | ICD-10-CM

## 2022-04-10 DIAGNOSIS — M48062 Spinal stenosis, lumbar region with neurogenic claudication: Secondary | ICD-10-CM | POA: Diagnosis not present

## 2022-04-10 LAB — ECHOCARDIOGRAM COMPLETE
Area-P 1/2: 3.68 cm2
Calc EF: 63.6 %
S' Lateral: 3.4 cm
Single Plane A2C EF: 66.6 %
Single Plane A4C EF: 60.5 %

## 2022-04-10 MED ORDER — PERFLUTREN LIPID MICROSPHERE
1.0000 mL | INTRAVENOUS | Status: AC | PRN
  Administered 2022-04-10: 1 mL via INTRAVENOUS

## 2022-04-17 ENCOUNTER — Encounter: Payer: Self-pay | Admitting: Family Medicine

## 2022-04-17 ENCOUNTER — Telehealth: Payer: Medicare Other | Admitting: Physician Assistant

## 2022-04-17 ENCOUNTER — Other Ambulatory Visit: Payer: Self-pay | Admitting: Family Medicine

## 2022-04-17 DIAGNOSIS — U071 COVID-19: Secondary | ICD-10-CM

## 2022-04-17 MED ORDER — NIRMATRELVIR/RITONAVIR (PAXLOVID)TABLET: 3.0000 | ORAL_TABLET | Freq: Two times a day (BID) | ORAL | 0 refills | Status: AC

## 2022-04-17 MED ORDER — MOLNUPIRAVIR EUA 200MG CAPSULE: 4.0000 | ORAL_CAPSULE | Freq: Two times a day (BID) | ORAL | 0 refills | Status: AC

## 2022-04-17 MED ORDER — ALBUTEROL SULFATE HFA 108 (90 BASE) MCG/ACT IN AERS: 1.0000 | INHALATION_SPRAY | Freq: Four times a day (QID) | RESPIRATORY_TRACT | 0 refills | Status: DC | PRN

## 2022-04-17 MED ORDER — BENZONATATE 200 MG PO CAPS: 200.0000 mg | ORAL_CAPSULE | Freq: Three times a day (TID) | ORAL | 0 refills | Status: AC | PRN

## 2022-04-17 NOTE — Progress Notes (Signed)
Virtual Visit Consent   Rebecca Brooks, you are scheduled for a virtual visit with a [facility] provider today. Just as with appointments in the office, your consent must be obtained to participate. Your consent will be active for this visit and any virtual visit you may have with one of our providers in the next 365 days. If you have a MyChart account, a copy of this consent can be sent to you electronically.  As this is a virtual visit, video technology does not allow for your provider to perform a traditional examination. This may limit your provider's ability to fully assess your condition. If your provider identifies any concerns that need to be evaluated in person or the need to arrange testing (such as labs, EKG, etc.), we will make arrangements to do so. Although advances in technology are sophisticated, we cannot ensure that it will always work on either your end or our end. If the connection with a video visit is poor, the visit may have to be switched to a telephone visit. With either a video or telephone visit, we are not always able to ensure that we have a secure connection.  By engaging in this virtual visit, you consent to the provision of healthcare and authorize for your insurance to be billed (if applicable) for the services provided during this visit. Depending on your insurance coverage, you may receive a charge related to this service.  I need to obtain your verbal consent now. Are you willing to proceed with your visit today? [name] has provided verbal consent on 04/17/2022 for a virtual visit (video or telephone). Mar Daring, PA-C  Date: 04/17/2022 3:27 PM  Virtual Visit via Video Note   I, Mar Daring, connected with  Latalia Etzler  ([id number], Sep 12, 1950) on 04/17/22 at  3:15 PM EDT by a video-enabled telemedicine application and verified that I am speaking with the correct person using two identifiers.  Location: Patient: Virtual  Visit Location Patient: Home Provider: Virtual Visit Location Provider: Home Office   I discussed the limitations of evaluation and management by telemedicine and the availability of in person appointments. The patient expressed understanding and agreed to proceed.    History of Present Illness: [name] is a 71 y.o. who identifies as a female who was assigned female at birth, and is being seen today for Covid 71.  HPI: URI  This is a new problem. Episode onset: Symptoms started yesterday; tested positive this morning. The problem has been gradually worsening. Maximum temperature: subjective fevers. The fever has been present for Less than 1 day. Associated symptoms include chest pain (tightness), congestion, coughing, headaches, a plugged ear sensation, rhinorrhea, sneezing, a sore throat (scratchy) and wheezing. Pertinent negatives include no diarrhea, ear pain, nausea or vomiting. Associated symptoms comments: Mild body aches, chills. The treatment provided no relief.     Problems:  Patient Active Problem List   Diagnosis Date Noted   Osteopenia 03/29/2022   Gout 03/29/2022   Murmur, cardiac 03/29/2022   Foot pain 03/29/2022   Lumbar stenosis with neurogenic claudication 10/21/2021   Depression 02/23/2021   Hyperglycemia 02/16/2020   History of COVID-19 10/16/2018   Atrophic vaginitis 05/14/2018   OSA (obstructive sleep apnea) 04/26/2018   Muscle cramps 01/02/2018   IDA (iron deficiency anemia) 12/22/2016   Primary osteoarthritis of both knees 10/03/2016   DDD (degenerative disc disease), cervical 10/03/2016   DDD (degenerative disc disease), lumbar 10/03/2016   History of total hip  replacement, right 10/03/2016   History of anemia 10/03/2016   Chondromalacia 10/03/2016   Primary osteoarthritis of shoulder 10/03/2016   Primary osteoarthritis of both hands 10/03/2016   Abnormal nuclear stress test 08/23/2015   Globus sensation 08/13/2015   Encounter for screening  mammogram for malignant neoplasm of breast 08/13/2015   Chest pain at rest    Chest pain with moderate risk of acute coronary syndrome 07/28/2015   Annual physical exam 07/11/2015   RLS (restless legs syndrome) 11/01/2014   Insomnia 11/01/2014   Thoracic back pain 01/12/2014   Allergic rhinitis 11/03/2013   Preventative health care 12/13/2012   Recurrent epistaxis 02/20/2012   Shoulder pain, bilateral 11/01/2011   Arthralgia 10/18/2011   Peripheral edema 09/10/2011   Asthma 08/19/2011   Palpitations 08/01/2011   Anemia 08/01/2011   Hypokalemia 08/01/2011   Elevated WBC count 08/01/2011   GERD (gastroesophageal reflux disease)    Low back pain radiating to right leg    Class 3 severe obesity with serious comorbidity and body mass index (BMI) of 45.0 to 49.9 in adult [facility]) 07/17/2011   Hypothyroidism 07/17/2011   Hyperlipidemia 07/17/2011   Essential hypertension 07/17/2011    Allergies:  Allergies  Allergen Reactions   Bee Venom Anaphylaxis   Accupril [Quinapril Hcl] Cough   Quinapril-Hydrochlorothiazide Cough   Rosuvastatin Other (See Comments)    Leg aches.   Medications:  Current Outpatient Medications:    nirmatrelvir/ritonavir EUA (PAXLOVID) 20 x 150 MG & 10 x '100MG'$  TABS, Take 3 tablets by mouth 2 (two) times daily for 5 days. (Take nirmatrelvir 150 mg two tablets twice daily for 5 days and ritonavir 100 mg one tablet twice daily for 5 days) Patient GFR is 68.77, Disp: 30 tablet, Rfl: 0   acetaminophen (TYLENOL) 500 MG tablet, Take 1,000 mg by mouth in the morning and at bedtime., Disp: , Rfl:    albuterol (VENTOLIN HFA) 108 (90 Base) MCG/ACT inhaler, Inhale 1-2 puffs into the lungs every 6 (six) hours as needed for wheezing or shortness of breath., Disp: 1 each, Rfl: 0   benzonatate (TESSALON) 200 MG capsule, Take 1 capsule (200 mg total) by mouth 3 (three) times daily as needed for cough., Disp: 30 capsule, Rfl: 0   celecoxib (CELEBREX) 100 MG capsule, Take 1 capsule  (100 mg total) by mouth 2 (two) times daily as needed., Disp: 60 capsule, Rfl: 2   cetirizine (ZYRTEC) 10 MG tablet, Take 1 tablet (10 mg total) by mouth daily., Disp: 90 tablet, Rfl: 1   clobetasol ointment (TEMOVATE) 0.05 %, SMARTSIG:Sparingly Topical Twice Daily, Disp: , Rfl:    cyclobenzaprine (FLEXERIL) 10 MG tablet, Take 1 tablet (10 mg total) by mouth 3 (three) times daily as needed for muscle spasms., Disp: 30 tablet, Rfl: 0   diclofenac Sodium (VOLTAREN) 1 % GEL, Apply 2-4 g topically 4 (four) times daily as needed., Disp: 350 g, Rfl: 5   famotidine (PEPCID) 20 MG tablet, TAKE 1 TABLET(20 MG) BY MOUTH AT BEDTIME, Disp: 90 tablet, Rfl: 1   fluticasone (FLONASE) 50 MCG/ACT nasal spray, SHAKE LIQUID AND USE 2 SPRAYS IN EACH NOSTRIL DAILY (Patient taking differently: 2 sprays at bedtime. SHAKE LIQUID AND USE 2 SPRAYS IN EACH NOSTRIL DAILY), Disp: 48 g, Rfl: 1   furosemide (LASIX) 20 MG tablet, Take 1 tablet (20 mg total) by mouth daily as needed for fluid or edema (weight gain > 3#/24 hr)., Disp: 180 tablet, Rfl: 1   guaiFENesin (MUCINEX) 600 MG 12 hr tablet, Take  2 tablets (1,200 mg total) by mouth 2 (two) times daily. (Patient taking differently: Take 1,200 mg by mouth 2 (two) times daily as needed for cough.), Disp: 30 tablet, Rfl: 2   hydrochlorothiazide (HYDRODIURIL) 25 MG tablet, Take 1 tablet (25 mg total) by mouth daily., Disp: 90 tablet, Rfl: 0   HYDROcodone-acetaminophen (NORCO) 10-325 MG tablet, Take 2 tablets by mouth every 4 (four) hours as needed for severe pain ((score 7 to 10))., Disp: 30 tablet, Rfl: 0   levothyroxine (SYNTHROID) 100 MCG tablet, TAKE 1 TABLET(100 MCG) BY MOUTH DAILY BEFORE AND BREAKFAST, Disp: 90 tablet, Rfl: 0   metoprolol tartrate (LOPRESSOR) 50 MG tablet, TAKE 1 TABLET(50 MG) BY MOUTH TWICE DAILY, Disp: 180 tablet, Rfl: 1   pantoprazole (PROTONIX) 40 MG tablet, TAKE 1 TABLET(40 MG) BY MOUTH DAILY, Disp: 90 tablet, Rfl: 3   potassium chloride SA (K-DUR,KLOR-CON)  20 MEQ tablet, TAKE 1 TABLET BY MOUTH DAILY AS NEEDED(DEPENDING ON LASIX USE), Disp: 90 tablet, Rfl: 3   rOPINIRole (REQUIP) 1 MG tablet, Take 1 tablet (1 mg total) by mouth at bedtime., Disp: 90 tablet, Rfl: 0   simvastatin (ZOCOR) 40 MG tablet, Take 1 tablet (40 mg total) by mouth at bedtime., Disp: 90 tablet, Rfl: 3   traMADol (ULTRAM) 50 MG tablet, Take 1 tablet (50 mg total) by mouth 3 (three) times daily as needed., Disp: 90 tablet, Rfl: 0   Vitamin D3 (VITAMIN D) 25 MCG tablet, Take 1 tablet (1,000 Units total) by mouth daily., Disp: 60 tablet, Rfl: 1   zolpidem (AMBIEN) 10 MG tablet, Take 1 tablet (10 mg total) by mouth at bedtime as needed for sleep., Disp: 30 tablet, Rfl: 2  Observations/Objective: Patient is well-developed, well-nourished in no acute distress.  Resting comfortably at home.  Head is normocephalic, atraumatic.  No labored breathing.  Speech is clear and coherent with logical content.  Patient is alert and oriented at baseline.    Assessment and Plan: 1. COVID-19 - nirmatrelvir/ritonavir EUA (PAXLOVID) 20 x 150 MG & 10 x '100MG'$  TABS; Take 3 tablets by mouth 2 (two) times daily for 5 days. (Take nirmatrelvir 150 mg two tablets twice daily for 5 days and ritonavir 100 mg one tablet twice daily for 5 days) Patient GFR is 68.77  Dispense: 30 tablet; Refill: 0 - albuterol (VENTOLIN HFA) 108 (90 Base) MCG/ACT inhaler; Inhale 1-2 puffs into the lungs every 6 (six) hours as needed for wheezing or shortness of breath.  Dispense: 1 each; Refill: 0 - benzonatate (TESSALON) 200 MG capsule; Take 1 capsule (200 mg total) by mouth 3 (three) times daily as needed for cough.  Dispense: 30 capsule; Refill: 0  - Continue OTC symptomatic management of choice - Will send OTC vitamins and supplement information through AVS - Paxlovid, albuterol,and tessalon prescribed - Patient enrolled in MyChart symptom monitoring - Push fluids - Rest as needed - Discussed return precautions and when  to seek in-person evaluation, sent via AVS as well   Follow Up Instructions: I discussed the assessment and treatment plan with the patient. The patient was provided an opportunity to ask questions and all were answered. The patient agreed with the plan and demonstrated an understanding of the instructions.  A copy of instructions were sent to the patient via MyChart unless otherwise noted below.    The patient was advised to call back or seek an in-person evaluation if the symptoms worsen or if the condition fails to improve as anticipated.  Time:  I spent  14 minutes with the patient via telehealth technology discussing the above problems/concerns.    Mar Daring, PA-C

## 2022-04-17 NOTE — Patient Instructions (Signed)
Rebecca Brooks, thank you for joining Mar Daring, PA-C for today's virtual visit.  While this provider is not your primary care provider (PCP), if your PCP is located in our provider database this encounter information will be shared with them immediately following your visit.  Consent: (Patient) Rebecca Brooks provided verbal consent for this virtual visit at the beginning of the encounter.  Current Medications:  Current Outpatient Medications:    nirmatrelvir/ritonavir EUA (PAXLOVID) 20 x 150 MG & 10 x '100MG'$  TABS, Take 3 tablets by mouth 2 (two) times daily for 5 days. (Take nirmatrelvir 150 mg two tablets twice daily for 5 days and ritonavir 100 mg one tablet twice daily for 5 days) Patient GFR is 68.77, Disp: 30 tablet, Rfl: 0   acetaminophen (TYLENOL) 500 MG tablet, Take 1,000 mg by mouth in the morning and at bedtime., Disp: , Rfl:    albuterol (VENTOLIN HFA) 108 (90 Base) MCG/ACT inhaler, Inhale 1-2 puffs into the lungs every 6 (six) hours as needed for wheezing or shortness of breath., Disp: 1 each, Rfl: 0   benzonatate (TESSALON) 200 MG capsule, Take 1 capsule (200 mg total) by mouth 3 (three) times daily as needed for cough., Disp: 30 capsule, Rfl: 0   celecoxib (CELEBREX) 100 MG capsule, Take 1 capsule (100 mg total) by mouth 2 (two) times daily as needed., Disp: 60 capsule, Rfl: 2   cetirizine (ZYRTEC) 10 MG tablet, Take 1 tablet (10 mg total) by mouth daily., Disp: 90 tablet, Rfl: 1   clobetasol ointment (TEMOVATE) 0.05 %, SMARTSIG:Sparingly Topical Twice Daily, Disp: , Rfl:    cyclobenzaprine (FLEXERIL) 10 MG tablet, Take 1 tablet (10 mg total) by mouth 3 (three) times daily as needed for muscle spasms., Disp: 30 tablet, Rfl: 0   diclofenac Sodium (VOLTAREN) 1 % GEL, Apply 2-4 g topically 4 (four) times daily as needed., Disp: 350 g, Rfl: 5   famotidine (PEPCID) 20 MG tablet, TAKE 1 TABLET(20 MG) BY MOUTH AT BEDTIME, Disp: 90 tablet, Rfl: 1   fluticasone  (FLONASE) 50 MCG/ACT nasal spray, SHAKE LIQUID AND USE 2 SPRAYS IN EACH NOSTRIL DAILY (Patient taking differently: 2 sprays at bedtime. SHAKE LIQUID AND USE 2 SPRAYS IN EACH NOSTRIL DAILY), Disp: 48 g, Rfl: 1   furosemide (LASIX) 20 MG tablet, Take 1 tablet (20 mg total) by mouth daily as needed for fluid or edema (weight gain > 3#/24 hr)., Disp: 180 tablet, Rfl: 1   guaiFENesin (MUCINEX) 600 MG 12 hr tablet, Take 2 tablets (1,200 mg total) by mouth 2 (two) times daily. (Patient taking differently: Take 1,200 mg by mouth 2 (two) times daily as needed for cough.), Disp: 30 tablet, Rfl: 2   hydrochlorothiazide (HYDRODIURIL) 25 MG tablet, Take 1 tablet (25 mg total) by mouth daily., Disp: 90 tablet, Rfl: 0   HYDROcodone-acetaminophen (NORCO) 10-325 MG tablet, Take 2 tablets by mouth every 4 (four) hours as needed for severe pain ((score 7 to 10))., Disp: 30 tablet, Rfl: 0   levothyroxine (SYNTHROID) 100 MCG tablet, TAKE 1 TABLET(100 MCG) BY MOUTH DAILY BEFORE AND BREAKFAST, Disp: 90 tablet, Rfl: 0   metoprolol tartrate (LOPRESSOR) 50 MG tablet, TAKE 1 TABLET(50 MG) BY MOUTH TWICE DAILY, Disp: 180 tablet, Rfl: 1   pantoprazole (PROTONIX) 40 MG tablet, TAKE 1 TABLET(40 MG) BY MOUTH DAILY, Disp: 90 tablet, Rfl: 3   potassium chloride SA (K-DUR,KLOR-CON) 20 MEQ tablet, TAKE 1 TABLET BY MOUTH DAILY AS NEEDED(DEPENDING ON LASIX USE), Disp: 90 tablet, Rfl:  3   rOPINIRole (REQUIP) 1 MG tablet, Take 1 tablet (1 mg total) by mouth at bedtime., Disp: 90 tablet, Rfl: 0   simvastatin (ZOCOR) 40 MG tablet, Take 1 tablet (40 mg total) by mouth at bedtime., Disp: 90 tablet, Rfl: 3   traMADol (ULTRAM) 50 MG tablet, Take 1 tablet (50 mg total) by mouth 3 (three) times daily as needed., Disp: 90 tablet, Rfl: 0   Vitamin D3 (VITAMIN D) 25 MCG tablet, Take 1 tablet (1,000 Units total) by mouth daily., Disp: 60 tablet, Rfl: 1   zolpidem (AMBIEN) 10 MG tablet, Take 1 tablet (10 mg total) by mouth at bedtime as needed for sleep.,  Disp: 30 tablet, Rfl: 2   Medications ordered in this encounter:  Meds ordered this encounter  Medications   nirmatrelvir/ritonavir EUA (PAXLOVID) 20 x 150 MG & 10 x '100MG'$  TABS    Sig: Take 3 tablets by mouth 2 (two) times daily for 5 days. (Take nirmatrelvir 150 mg two tablets twice daily for 5 days and ritonavir 100 mg one tablet twice daily for 5 days) Patient GFR is 68.77    Dispense:  30 tablet    Refill:  0    Order Specific Question:   Supervising Provider    Answer:   Chase Picket [[id number]]   albuterol (VENTOLIN HFA) 108 (90 Base) MCG/ACT inhaler    Sig: Inhale 1-2 puffs into the lungs every 6 (six) hours as needed for wheezing or shortness of breath.    Dispense:  1 each    Refill:  0    Order Specific Question:   Supervising Provider    Answer:   Chase Picket [[id number]]   benzonatate (TESSALON) 200 MG capsule    Sig: Take 1 capsule (200 mg total) by mouth 3 (three) times daily as needed for cough.    Dispense:  30 capsule    Refill:  0    Order Specific Question:   Supervising Provider    Answer:   Chase Picket [id number]     *If you need refills on other medications prior to your next appointment, please contact your pharmacy*  Follow-Up: Call back or seek an in-person evaluation if the symptoms worsen or if the condition fails to improve as anticipated.  [facility] [phone]  Other Instructions  COVID-19 COVID-19, or coronavirus disease 2019, is an infection that is caused by a new (novel) coronavirus called SARS-CoV-2. COVID-19 can cause many symptoms. In some people, the virus may not cause any symptoms. In others, it may cause mild or severe symptoms. Some people with severe infection develop severe disease. What are the causes? This illness is caused by a virus. The virus may be in the air as tiny specks of fluid (aerosols) or droplets, or it may be on surfaces. You may catch the virus by: Breathing in droplets from an infected  person. Droplets can be spread by a person breathing, speaking, singing, coughing, or sneezing. Touching something, like a table or a doorknob, that has virus on it (is contaminated) and then touching your mouth, nose, or eyes. What increases the risk? Risk for infection: You are more likely to get infected with the COVID-19 virus if: You are within 6 ft (1.8 m) of a person with COVID-19 for 15 minutes or longer. You are providing care for a person who is infected with COVID-19. You are in close personal contact with other people. Close personal contact includes hugging, kissing, or sharing  eating or drinking utensils. Risk for serious illness caused by COVID-19: You are more likely to get seriously ill from the COVID-19 virus if: You have cancer. You have a long-term (chronic) disease, such as: Chronic lung disease. This includes pulmonary embolism, chronic obstructive pulmonary disease, and cystic fibrosis. Long-term disease that lowers your body's ability to fight infection (immunocompromise). Serious cardiac conditions, such as heart failure, coronary artery disease, or cardiomyopathy. Diabetes. Chronic kidney disease. Liver diseases. These include cirrhosis, nonalcoholic fatty liver disease, alcoholic liver disease, or autoimmune hepatitis. You have obesity. You are pregnant or were recently pregnant. You have sickle cell disease. What are the signs or symptoms? Symptoms of this condition can range from mild to severe. Symptoms may appear any time from 2 to 14 days after being exposed to the virus. They include: Fever or chills. Shortness of breath or trouble breathing. Feeling tired or very tired. Headaches, body aches, or muscle aches. Runny or stuffy nose, sneezing, coughing, or sore throat. New loss of taste or smell. This is rare. Some people may also have stomach problems, such as nausea, vomiting, or diarrhea. Other people may not have any symptoms of COVID-19. How is this  diagnosed? This condition may be diagnosed by testing samples to check for the COVID-19 virus. The most common tests are the PCR test and the antigen test. Tests may be done in the lab or at home. They include: Using a swab to take a sample of fluid from the back of your nose and throat (nasopharyngeal fluid), from your nose, or from your throat. Testing a sample of saliva from your mouth. Testing a sample of coughed-up mucus from your lungs (sputum). How is this treated? Treatment for COVID-19 infection depends on the severity of the condition. Mild symptoms can be managed at home with rest, fluids, and over-the-counter medicines. Serious symptoms may be treated in a hospital intensive care unit (ICU). Treatment in the ICU may include: Supplemental oxygen. Extra oxygen is given through a tube in the nose, a face mask, or a hood. Medicines. These may include: Antivirals, such as monoclonal antibodies. These help your body fight off certain viruses that can cause disease. Anti-inflammatories, such as corticosteroids. These reduce inflammation and suppress the immune system. Antithrombotics. These prevent or treat blood clots, if they develop. Convalescent plasma. This helps boost your immune system, if you have an underlying immunosuppressive condition or are getting immunosuppressive treatments. Prone positioning. This means you will lie on your stomach. This helps oxygen to get into your lungs. Infection control measures. If you are at risk for more serious illness caused by COVID-19, your health care provider may prescribe two long-acting monoclonal antibodies, given together every 6 months. How is this prevented? To protect yourself: Use preventive medicine (pre-exposure prophylaxis). You may get pre-exposure prophylaxis if you have moderate or severe immunocompromise. Get vaccinated. Anyone 63 months old or older who meets guidelines can get a COVID-19 vaccine or vaccine series. This includes  people who are pregnant or making breast milk (lactating). Get an added dose of COVID-19 vaccine after your first vaccine or vaccine series if you have moderate to severe immunocompromise. This applies if you have had a solid organ transplant or have been diagnosed with an immunocompromising condition. You should get the added dose 4 weeks after you got the first COVID-19 vaccine or vaccine series. If you get an mRNA vaccine, you will need a 3-dose primary series. If you get the J&J/Janssen vaccine, you will need a 2-dose primary series,  with the second dose being an mRNA vaccine. Talk to your health care provider about getting experimental monoclonal antibodies. This treatment is approved under emergency use authorization to prevent severe illness before or after being exposed to the COVID-19 virus. You may be given monoclonal antibodies if: You have moderate or severe immunocompromise. This includes treatments that lower your immune response. People with immunocompromise may not develop protection against COVID-19 when they are vaccinated. You cannot be vaccinated. You may not get a vaccine if you have a severe allergic reaction to the vaccine or its components. You are not fully vaccinated. You are in a facility where COVID-19 is present and: Are in close contact with a person who is infected with the COVID-19 virus. Are at high risk of being exposed to the COVID-19 virus. You are at risk of illness from new variants of the COVID-19 virus. To protect others: If you have symptoms of COVID-19, take steps to prevent the virus from spreading to others. Stay home. Leave your house only to get medical care. Do not use public transit, if possible. Do not travel while you are sick. Wash your hands often with soap and water for at least 20 seconds. If soap and water are not available, use alcohol-based hand sanitizer. Make sure that all people in your household wash their hands well and often. Cough or  sneeze into a tissue or your sleeve or elbow. Do not cough or sneeze into your hand or into the air. Where to find more information Centers for Disease Control and Prevention: [employer] World Health Organization: https://www.castaneda.info/ Get help right away if: You have trouble breathing. You have pain or pressure in your chest. You are confused. You have bluish lips and fingernails. You have trouble waking from sleep. You have symptoms that get worse. These symptoms may be an emergency. Get help right away. Call 911. Do not wait to see if the symptoms will go away. Do not drive yourself to the hospital. Summary COVID-19 is an infection that is caused by a new coronavirus. Sometimes, there are no symptoms. Other times, symptoms range from mild to severe. Some people with a severe COVID-19 infection develop severe disease. The virus that causes COVID-19 can spread from person to person through droplets or aerosols from breathing, speaking, singing, coughing, or sneezing. Mild symptoms of COVID-19 can be managed at home with rest, fluids, and over-the-counter medicines. This information is not intended to replace advice given to you by your health care provider. Make sure you discuss any questions you have with your health care provider. Document Revised: 06/30/2021 Document Reviewed: 06/30/2021 Elsevier Patient Education  [place].    If you have been instructed to have an in-person evaluation today at a local Urgent Care facility, please use the link below. It will take you to a list of all of our available [facility], including address, phone number and hours of operation. Please do not delay care.  [facility]  If you or a family member do not have a primary care provider, use the link below to schedule a visit and establish care. When you choose a [facility] primary care physician or advanced practice provider, you gain a  long-term partner in health. Find a Primary Care Provider  Learn more about 's in-office and virtual care options: [facility]

## 2022-04-27 ENCOUNTER — Encounter: Payer: Self-pay | Admitting: Family Medicine

## 2022-04-27 MED ORDER — FLUTICASONE PROPIONATE 50 MCG/ACT NA SUSP: 2.0000 | Freq: Every day | NASAL | 5 refills | Status: DC

## 2022-04-28 ENCOUNTER — Telehealth: Payer: Medicare Other | Admitting: Family Medicine

## 2022-04-28 DIAGNOSIS — B9689 Other specified bacterial agents as the cause of diseases classified elsewhere: Secondary | ICD-10-CM | POA: Diagnosis not present

## 2022-04-28 DIAGNOSIS — J40 Bronchitis, not specified as acute or chronic: Secondary | ICD-10-CM | POA: Diagnosis not present

## 2022-04-28 DIAGNOSIS — J019 Acute sinusitis, unspecified: Secondary | ICD-10-CM | POA: Diagnosis not present

## 2022-04-28 MED ORDER — AMOXICILLIN-POT CLAVULANATE 875-125 MG PO TABS: 1.0000 | ORAL_TABLET | Freq: Two times a day (BID) | ORAL | 0 refills | Status: AC

## 2022-04-28 NOTE — Progress Notes (Signed)
Virtual Visit Consent   Buford Gayler, you are scheduled for a virtual visit with a [facility] provider today. Just as with appointments in the office, your consent must be obtained to participate. Your consent will be active for this visit and any virtual visit you may have with one of our providers in the next 365 days. If you have a MyChart account, a copy of this consent can be sent to you electronically.  As this is a virtual visit, video technology does not allow for your provider to perform a traditional examination. This may limit your provider's ability to fully assess your condition. If your provider identifies any concerns that need to be evaluated in person or the need to arrange testing (such as labs, EKG, etc.), we will make arrangements to do so. Although advances in technology are sophisticated, we cannot ensure that it will always work on either your end or our end. If the connection with a video visit is poor, the visit may have to be switched to a telephone visit. With either a video or telephone visit, we are not always able to ensure that we have a secure connection.  By engaging in this virtual visit, you consent to the provision of healthcare and authorize for your insurance to be billed (if applicable) for the services provided during this visit. Depending on your insurance coverage, you may receive a charge related to this service.  I need to obtain your verbal consent now. Are you willing to proceed with your visit today? Rebecca Brooks has provided verbal consent on 04/28/2022 for a virtual visit (video or telephone). Rebecca Nims, FNP  Date: 04/28/2022 4:52 PM  Virtual Visit via Video Note   I, Rebecca Brooks, connected with  Rebecca Brooks  ([id number], 1950-07-31) on 04/28/22 at  5:15 PM EDT by a video-enabled telemedicine application and verified that I am speaking with the correct person using two identifiers.  Location: Patient: Virtual Visit Location  Patient: Home Provider: Virtual Visit Location Provider: Home Office   I discussed the limitations of evaluation and management by telemedicine and the availability of in person appointments. The patient expressed understanding and agreed to proceed.    History of Present Illness: Rebecca Brooks is a 71 y.o. who identifies as a female who was assigned female at birth, and is being seen today for cough, sinus pain and pressure, post covid with sx persisting and worsening, no energy, no wheezing or sob but has inhaler if needed, also has tessalon to use. No fever. Coughing up green mucus. Marland Kitchen  HPI: HPI  Problems:  Patient Active Problem List   Diagnosis Date Noted   Osteopenia 03/29/2022   Gout 03/29/2022   Murmur, cardiac 03/29/2022   Foot pain 03/29/2022   Lumbar stenosis with neurogenic claudication 10/21/2021   Depression 02/23/2021   Hyperglycemia 02/16/2020   History of COVID-19 10/16/2018   Atrophic vaginitis 05/14/2018   OSA (obstructive sleep apnea) 04/26/2018   Muscle cramps 01/02/2018   IDA (iron deficiency anemia) 12/22/2016   Primary osteoarthritis of both knees 10/03/2016   DDD (degenerative disc disease), cervical 10/03/2016   DDD (degenerative disc disease), lumbar 10/03/2016   History of total hip replacement, right 10/03/2016   History of anemia 10/03/2016   Chondromalacia 10/03/2016   Primary osteoarthritis of shoulder 10/03/2016   Primary osteoarthritis of both hands 10/03/2016   Abnormal nuclear stress test 08/23/2015   Globus sensation 08/13/2015   Encounter for screening mammogram for malignant neoplasm of  breast 08/13/2015   Chest pain at rest    Chest pain with moderate risk of acute coronary syndrome 07/28/2015   Annual physical exam 07/11/2015   RLS (restless legs syndrome) 11/01/2014   Insomnia 11/01/2014   Thoracic back pain 01/12/2014   Allergic rhinitis 11/03/2013   Preventative health care 12/13/2012   Recurrent epistaxis 02/20/2012    Shoulder pain, bilateral 11/01/2011   Arthralgia 10/18/2011   Peripheral edema 09/10/2011   Asthma 08/19/2011   Palpitations 08/01/2011   Anemia 08/01/2011   Hypokalemia 08/01/2011   Elevated WBC count 08/01/2011   GERD (gastroesophageal reflux disease)    Low back pain radiating to right leg    Class 3 severe obesity with serious comorbidity and body mass index (BMI) of 45.0 to 49.9 in adult [facility]) 07/17/2011   Hypothyroidism 07/17/2011   Hyperlipidemia 07/17/2011   Essential hypertension 07/17/2011    Allergies:  Allergies  Allergen Reactions   Bee Venom Anaphylaxis   Accupril [Quinapril Hcl] Cough   Quinapril-Hydrochlorothiazide Cough   Rosuvastatin Other (See Comments)    Leg aches.   Medications:  Current Outpatient Medications:    amoxicillin-clavulanate (AUGMENTIN) 875-125 MG tablet, Take 1 tablet by mouth 2 (two) times daily for 10 days., Disp: 20 tablet, Rfl: 0   acetaminophen (TYLENOL) 500 MG tablet, Take 1,000 mg by mouth in the morning and at bedtime., Disp: , Rfl:    albuterol (VENTOLIN HFA) 108 (90 Base) MCG/ACT inhaler, Inhale 1-2 puffs into the lungs every 6 (six) hours as needed for wheezing or shortness of breath., Disp: 1 each, Rfl: 0   benzonatate (TESSALON) 200 MG capsule, Take 1 capsule (200 mg total) by mouth 3 (three) times daily as needed for cough., Disp: 30 capsule, Rfl: 0   celecoxib (CELEBREX) 100 MG capsule, Take 1 capsule (100 mg total) by mouth 2 (two) times daily as needed., Disp: 60 capsule, Rfl: 2   cetirizine (ZYRTEC) 10 MG tablet, Take 1 tablet (10 mg total) by mouth daily., Disp: 90 tablet, Rfl: 1   clobetasol ointment (TEMOVATE) 0.05 %, SMARTSIG:Sparingly Topical Twice Daily, Disp: , Rfl:    cyclobenzaprine (FLEXERIL) 10 MG tablet, Take 1 tablet (10 mg total) by mouth 3 (three) times daily as needed for muscle spasms., Disp: 30 tablet, Rfl: 0   diclofenac Sodium (VOLTAREN) 1 % GEL, Apply 2-4 g topically 4 (four) times daily as needed., Disp: 350  g, Rfl: 5   famotidine (PEPCID) 20 MG tablet, TAKE 1 TABLET(20 MG) BY MOUTH AT BEDTIME, Disp: 90 tablet, Rfl: 1   fluticasone (FLONASE) 50 MCG/ACT nasal spray, Place 2 sprays into both nostrils at bedtime., Disp: 16 g, Rfl: 5   furosemide (LASIX) 20 MG tablet, Take 1 tablet (20 mg total) by mouth daily as needed for fluid or edema (weight gain > 3#/24 hr)., Disp: 180 tablet, Rfl: 1   guaiFENesin (MUCINEX) 600 MG 12 hr tablet, Take 2 tablets (1,200 mg total) by mouth 2 (two) times daily. (Patient taking differently: Take 1,200 mg by mouth 2 (two) times daily as needed for cough.), Disp: 30 tablet, Rfl: 2   hydrochlorothiazide (HYDRODIURIL) 25 MG tablet, Take 1 tablet (25 mg total) by mouth daily., Disp: 90 tablet, Rfl: 0   HYDROcodone-acetaminophen (NORCO) 10-325 MG tablet, Take 2 tablets by mouth every 4 (four) hours as needed for severe pain ((score 7 to 10))., Disp: 30 tablet, Rfl: 0   levothyroxine (SYNTHROID) 100 MCG tablet, TAKE 1 TABLET(100 MCG) BY MOUTH DAILY BEFORE AND BREAKFAST, Disp: 90  tablet, Rfl: 0   metoprolol tartrate (LOPRESSOR) 50 MG tablet, TAKE 1 TABLET(50 MG) BY MOUTH TWICE DAILY, Disp: 180 tablet, Rfl: 1   pantoprazole (PROTONIX) 40 MG tablet, TAKE 1 TABLET(40 MG) BY MOUTH DAILY, Disp: 90 tablet, Rfl: 3   potassium chloride SA (K-DUR,KLOR-CON) 20 MEQ tablet, TAKE 1 TABLET BY MOUTH DAILY AS NEEDED(DEPENDING ON LASIX USE), Disp: 90 tablet, Rfl: 3   rOPINIRole (REQUIP) 1 MG tablet, Take 1 tablet (1 mg total) by mouth at bedtime., Disp: 90 tablet, Rfl: 0   simvastatin (ZOCOR) 40 MG tablet, Take 1 tablet (40 mg total) by mouth at bedtime., Disp: 90 tablet, Rfl: 3   traMADol (ULTRAM) 50 MG tablet, Take 1 tablet (50 mg total) by mouth 3 (three) times daily as needed., Disp: 90 tablet, Rfl: 0   Vitamin D3 (VITAMIN D) 25 MCG tablet, Take 1 tablet (1,000 Units total) by mouth daily., Disp: 60 tablet, Rfl: 1   zolpidem (AMBIEN) 10 MG tablet, Take 1 tablet (10 mg total) by mouth at bedtime as  needed for sleep., Disp: 30 tablet, Rfl: 2  Observations/Objective: Patient is well-developed, well-nourished in no acute distress.  Resting comfortably  at home.  Head is normocephalic, atraumatic.  No labored breathing.  Speech is clear and coherent with logical content.  Patient is alert and oriented at baseline.    Assessment and Plan: 1. Acute bacterial sinusitis  2. Bronchitis  Increase fluids, has humidification on CPAP, rest, tylenol, urgent care or OD if sx worsen.   Follow Up Instructions: I discussed the assessment and treatment plan with the patient. The patient was provided an opportunity to ask questions and all were answered. The patient agreed with the plan and demonstrated an understanding of the instructions.  A copy of instructions were sent to the patient via MyChart unless otherwise noted below.     The patient was advised to call back or seek an in-person evaluation if the symptoms worsen or if the condition fails to improve as anticipated.  Time:  I spent 10 minutes with the patient via telehealth technology discussing the above problems/concerns.    Rebecca Nims, FNP

## 2022-04-28 NOTE — Patient Instructions (Signed)

## 2022-05-02 DIAGNOSIS — M170 Bilateral primary osteoarthritis of knee: Secondary | ICD-10-CM | POA: Diagnosis not present

## 2022-05-03 ENCOUNTER — Telehealth (HOSPITAL_COMMUNITY): Payer: Self-pay | Admitting: Pharmacy Technician

## 2022-05-03 ENCOUNTER — Encounter: Payer: Self-pay | Admitting: Family

## 2022-05-03 ENCOUNTER — Encounter (HOSPITAL_COMMUNITY): Payer: Self-pay

## 2022-05-03 ENCOUNTER — Other Ambulatory Visit: Payer: Self-pay | Admitting: Family Medicine

## 2022-05-03 ENCOUNTER — Emergency Department (HOSPITAL_COMMUNITY)
Admission: EM | Admit: 2022-05-03 | Discharge: 2022-05-03 | Disposition: A | Discharge status: Home / Self Care | Payer: Medicare Other | Attending: Emergency Medicine | Admitting: Emergency Medicine

## 2022-05-03 ENCOUNTER — Emergency Department (HOSPITAL_COMMUNITY): Payer: Medicare Other

## 2022-05-03 ENCOUNTER — Telehealth: Payer: Self-pay | Admitting: Physician Assistant

## 2022-05-03 ENCOUNTER — Other Ambulatory Visit (HOSPITAL_COMMUNITY): Payer: Self-pay

## 2022-05-03 ENCOUNTER — Other Ambulatory Visit: Payer: Self-pay

## 2022-05-03 DIAGNOSIS — Z79899 Other long term (current) drug therapy: Secondary | ICD-10-CM | POA: Insufficient documentation

## 2022-05-03 DIAGNOSIS — I4891 Unspecified atrial fibrillation: Secondary | ICD-10-CM

## 2022-05-03 DIAGNOSIS — I10 Essential (primary) hypertension: Secondary | ICD-10-CM | POA: Diagnosis not present

## 2022-05-03 DIAGNOSIS — I4819 Other persistent atrial fibrillation: Secondary | ICD-10-CM

## 2022-05-03 DIAGNOSIS — R000 Tachycardia, unspecified: Secondary | ICD-10-CM | POA: Diagnosis not present

## 2022-05-03 LAB — CBC
HCT: 43.9 % (ref 36.0–46.0)
Hemoglobin: 14.3 g/dL (ref 12.0–15.0)
MCH: 30.2 pg (ref 26.0–34.0)
MCHC: 32.6 g/dL (ref 30.0–36.0)
MCV: 92.6 fL (ref 80.0–100.0)
Platelets: 401 10*3/uL — ABNORMAL HIGH (ref 150–400)
RBC: 4.74 MIL/uL (ref 3.87–5.11)
RDW: 12.6 % (ref 11.5–15.5)
WBC: 9.6 10*3/uL (ref 4.0–10.5)
nRBC: 0 % (ref 0.0–0.2)

## 2022-05-03 LAB — BASIC METABOLIC PANEL
Anion gap: 10 (ref 5–15)
BUN: 31 mg/dL — ABNORMAL HIGH (ref 8–23)
CO2: 25 mmol/L (ref 22–32)
Calcium: 9.9 mg/dL (ref 8.9–10.3)
Chloride: 101 mmol/L (ref 98–111)
Creatinine, Ser: 0.77 mg/dL (ref 0.44–1.00)
GFR, Estimated: >60 mL/min (ref >60)
Glucose, Bld: 146 mg/dL — ABNORMAL HIGH (ref 70–99)
Potassium: 3.8 mmol/L (ref 3.5–5.1)
Sodium: 136 mmol/L (ref 135–145)

## 2022-05-03 MED ORDER — DILTIAZEM HCL-DEXTROSE 125-5 MG/125ML-% IV SOLN (PREMIX)
5.0000 mg/h | INTRAVENOUS | Status: DC
  Administered 2022-05-03: 5 mg/h via INTRAVENOUS
  Filled 2022-05-03: qty 125

## 2022-05-03 MED ORDER — DILTIAZEM LOAD VIA INFUSION
10.0000 mg | Freq: Once | INTRAVENOUS | Status: AC
  Administered 2022-05-03: 10 mg via INTRAVENOUS
  Filled 2022-05-03: qty 10

## 2022-05-03 MED ORDER — APIXABAN 5 MG PO TABS: 5.0000 mg | ORAL_TABLET | Freq: Two times a day (BID) | ORAL | 11 refills | Status: DC

## 2022-05-03 MED ORDER — DILTIAZEM HCL ER COATED BEADS 120 MG PO CP24
240.0000 mg | ORAL_CAPSULE | Freq: Every day | ORAL | Status: DC
  Administered 2022-05-03: 240 mg via ORAL
  Filled 2022-05-03: qty 2

## 2022-05-03 MED ORDER — LACTATED RINGERS IV SOLN: INTRAVENOUS | Status: DC

## 2022-05-03 MED ORDER — DILTIAZEM HCL ER COATED BEADS 240 MG PO CP24: 240.0000 mg | ORAL_CAPSULE | Freq: Every day | ORAL | 3 refills | Status: DC

## 2022-05-03 MED ORDER — APIXABAN 5 MG PO TABS
5.0000 mg | ORAL_TABLET | Freq: Two times a day (BID) | ORAL | Status: DC
  Administered 2022-05-03: 5 mg via ORAL
  Filled 2022-05-03: qty 1

## 2022-05-03 NOTE — Telephone Encounter (Signed)
Pharmacy Patient Advocate Encounter  Insurance verification completed.    The patient is insured through [employer]   The patient is currently admitted and ran test claims for the following: Eliquis, Xarelto.  Copays and coinsurance results were relayed to Inpatient clinical team.

## 2022-05-03 NOTE — Telephone Encounter (Signed)
Pt husband called stating wife has a rapid heart rate between 107-137. Her apple watch says she is in Afib. No history of Afib. She woke up with chest pain and shortness of breath. Her husband asks if she can take one of his short acting cardizem. I discuss that I can't be sure what rhythm she is in and they do not know her BP. She is compliant on her CPAP.   Records show she had recent COVID-19 infection that did not require hospitalization.   We discussed Afib clinic today, but she is in RVR and symptomatic. I think her best option is to go to [facility] for evaluation. I let her know we will notify our cardiologist in the ER. I have also alerted charge RN.  Her husband will drive her.

## 2022-05-03 NOTE — ED Triage Notes (Signed)
Pt to er, pt states that she is here because she woke this am and felt very short of breath with her c pap on, states that she had a headache and felt funny, states that she felt her heart racing and put her watch on to check her pulse and it said she was in a fib, states that she called heart care and was told to come to the er.

## 2022-05-03 NOTE — Discharge Instructions (Signed)

## 2022-05-03 NOTE — ED Provider Notes (Signed)
[facility] DEPT Provider Note   CSN: [id number] Arrival date & time: 05/03/22  0758     History  Chief Complaint  Patient presents with   Tachycardia    Rebecca Brooks is a 71 y.o. female.  71 year old female presents with irregular heartbeat which began sometime within the last 12 hours.  Questionable remote history of A-fib.  Does have a history of hypertension and is on medication for that.  Has some vague chest discomfort with this that was not associated with dyspnea or diaphoresis.  States she feels her heart is beating irregularly.  Does not take blood thinners currently       Home Medications Prior to Admission medications   Medication Sig Start Date End Date Taking? Authorizing Provider  acetaminophen (TYLENOL) 500 MG tablet Take 1,000 mg by mouth in the morning and at bedtime.    [provider]  albuterol (VENTOLIN HFA) 108 (90 Base) MCG/ACT inhaler Inhale 1-2 puffs into the lungs every 6 (six) hours as needed for wheezing or shortness of breath. 04/17/22   Mar Daring, PA-C  amoxicillin-clavulanate (AUGMENTIN) 875-125 MG tablet Take 1 tablet by mouth 2 (two) times daily for 10 days. 04/28/22 05/08/22  Tempie Hoist, FNP  benzonatate (TESSALON) 200 MG capsule Take 1 capsule (200 mg total) by mouth 3 (three) times daily as needed for cough. 04/17/22   Mar Daring, PA-C  celecoxib (CELEBREX) 100 MG capsule Take 1 capsule (100 mg total) by mouth 2 (two) times daily as needed. 04/04/22   Mosie Lukes, MD  cetirizine (ZYRTEC) 10 MG tablet Take 1 tablet (10 mg total) by mouth daily. 12/23/21   Mosie Lukes, MD  clobetasol ointment (TEMOVATE) 0.05 % SMARTSIG:Sparingly Topical Twice Daily 09/01/21   [provider]  cyclobenzaprine (FLEXERIL) 10 MG tablet Take 1 tablet (10 mg total) by mouth 3 (three) times daily as needed for muscle spasms. 10/22/21   Dawley, Troy C, DO  diclofenac Sodium (VOLTAREN) 1 % GEL  Apply 2-4 g topically 4 (four) times daily as needed. 02/17/22   Mosie Lukes, MD  famotidine (PEPCID) 20 MG tablet TAKE 1 TABLET(20 MG) BY MOUTH AT BEDTIME 02/17/22   Mosie Lukes, MD  fluticasone [facility]) 50 MCG/ACT nasal spray Place 2 sprays into both nostrils at bedtime. 04/27/22   Mosie Lukes, MD  furosemide (LASIX) 20 MG tablet Take 1 tablet (20 mg total) by mouth daily as needed for fluid or edema (weight gain > 3#/24 hr). 12/20/21   Mosie Lukes, MD  guaiFENesin (MUCINEX) 600 MG 12 hr tablet Take 2 tablets (1,200 mg total) by mouth 2 (two) times daily. Patient taking differently: Take 1,200 mg by mouth 2 (two) times daily as needed for cough. 08/22/21   Terrilyn Saver, NP  hydrochlorothiazide (HYDRODIURIL) 25 MG tablet Take 1 tablet (25 mg total) by mouth daily. 03/02/22   Mosie Lukes, MD  HYDROcodone-acetaminophen (NORCO) 10-325 MG tablet Take 2 tablets by mouth every 4 (four) hours as needed for severe pain ((score 7 to 10)). 10/22/21   Dawley, Troy C, DO  levothyroxine (SYNTHROID) 100 MCG tablet TAKE 1 TABLET(100 MCG) BY MOUTH DAILY BEFORE AND BREAKFAST 03/02/22   Mosie Lukes, MD  metoprolol tartrate (LOPRESSOR) 50 MG tablet TAKE 1 TABLET(50 MG) BY MOUTH TWICE DAILY 11/21/21   Mosie Lukes, MD  pantoprazole (PROTONIX) 40 MG tablet TAKE 1 TABLET(40 MG) BY MOUTH DAILY 02/16/22   Mosie Lukes,  MD  potassium chloride SA (K-DUR,KLOR-CON) 20 MEQ tablet TAKE 1 TABLET BY MOUTH DAILY AS NEEDED(DEPENDING ON LASIX USE) 11/27/16   Mosie Lukes, MD  rOPINIRole (REQUIP) 1 MG tablet Take 1 tablet (1 mg total) by mouth at bedtime. 01/27/22   Mosie Lukes, MD  simvastatin (ZOCOR) 40 MG tablet Take 1 tablet (40 mg total) by mouth at bedtime. 08/10/21   Mosie Lukes, MD  traMADol (ULTRAM) 50 MG tablet Take 1 tablet (50 mg total) by mouth 3 (three) times daily as needed. 03/17/22   Mosie Lukes, MD  Vitamin D3 (VITAMIN D) 25 MCG tablet Take 1 tablet (1,000 Units total) by mouth daily.  01/27/22   Mosie Lukes, MD  zolpidem (AMBIEN) 10 MG tablet Take 1 tablet (10 mg total) by mouth at bedtime as needed for sleep. 12/20/21   Mosie Lukes, MD      Allergies    Bee venom, Accupril [quinapril hcl], Quinapril-hydrochlorothiazide, and Rosuvastatin    Review of Systems   Review of Systems  All other systems reviewed and are negative.   Physical Exam Updated Vital Signs Ht 1.588 m (5' 2.5")   Wt 113.4 kg   BMI 45.00 kg/m  Physical Exam Vitals and nursing note reviewed.  Constitutional:      General: She is not in acute distress.    Appearance: Normal appearance. She is well-developed. She is not toxic-appearing.  HENT:     Head: Normocephalic and atraumatic.  Eyes:     General: Lids are normal.     Conjunctiva/sclera: Conjunctivae normal.     Pupils: Pupils are equal, round, and reactive to light.  Neck:     Thyroid: No thyroid mass.     Trachea: No tracheal deviation.  Cardiovascular:     Rate and Rhythm: Tachycardia present. Rhythm irregular.     Heart sounds: Normal heart sounds. No murmur heard.    No gallop.  Pulmonary:     Effort: Pulmonary effort is normal. No respiratory distress.     Breath sounds: Normal breath sounds. No stridor. No decreased breath sounds, wheezing, rhonchi or rales.  Abdominal:     General: There is no distension.     Palpations: Abdomen is soft.     Tenderness: There is no abdominal tenderness. There is no rebound.  Musculoskeletal:        General: No tenderness. Normal range of motion.     Cervical back: Normal range of motion and neck supple.  Skin:    General: Skin is warm and dry.     Findings: No abrasion or rash.  Neurological:     Mental Status: She is alert and oriented to person, place, and time. Mental status is at baseline.     GCS: GCS eye subscore is 4. GCS verbal subscore is 5. GCS motor subscore is 6.     Cranial Nerves: No cranial nerve deficit.     Sensory: No sensory deficit.     Motor: Motor function  is intact.  Psychiatric:        Attention and Perception: Attention normal.        Speech: Speech normal.        Behavior: Behavior normal.     ED Results / Procedures / Treatments   Labs (all labs ordered are listed, but only abnormal results are displayed) Labs Reviewed  BASIC METABOLIC PANEL - Abnormal; Notable for the following components:      Result Value   Glucose,  Bld 146 (*)    BUN 31 (*)    All other components within normal limits  CBC - Abnormal; Notable for the following components:   Platelets 401 (*)    All other components within normal limits    EKG EKG Interpretation  Date/Time:  Wednesday May 03 2022 08:07:54 EDT Ventricular Rate:  112 PR Interval:    QRS Duration: 106 QT Interval:  356 QTC Calculation: 486 R Axis:   -34 Text Interpretation: Atrial fibrillation Left axis deviation Low voltage, precordial leads Consider anterior infarct Borderline repolarization abnormality Confirmed by Lacretia Leigh ([id number]) on 05/03/2022 9:35:36 AM  Radiology DG Chest 2 View  Result Date: 05/03/2022 CLINICAL DATA:  Rapid heart rate EXAM: CHEST - 2 VIEW COMPARISON:  Chest x-ray dated January 04, 2021 FINDINGS: The heart size and mediastinal contours are within normal limits. Both lungs are clear. Degenerative changes of the right-greater-than-left shoulders. IMPRESSION: No active cardiopulmonary disease. Electronically Signed   By: Yetta Glassman M.D.   On: 05/03/2022 08:37    Procedures Procedures    Medications Ordered in ED Medications  lactated ringers infusion (has no administration in time range)  diltiazem (CARDIZEM) 1 mg/mL load via infusion 10 mg (has no administration in time range)    And  diltiazem (CARDIZEM) 125 mg in dextrose 5% 125 mL (1 mg/mL) infusion (has no administration in time range)    ED Course/ Medical Decision Making/ A&P                           Medical Decision Making Amount and/or Complexity of Data Reviewed Labs:  ordered. Radiology: ordered.  Risk Prescription drug management.    CHA2DS2-VASc Score =   1  This indicates a  % annual risk of stroke. The patient's score is based upon:       Patient is EKG per interpretation shows atrial fibrillation without evidence of severe rapid ventricular response.  Patient given Cardizem bolus and placed on Cardizem drip.  Heart rate came down into the 80s but repeat EKG shows that she remains in atrial fibrillation.  Labs otherwise reassuring.  Consult to cardiology made  1:13 PM Patient seen by cardiology and medication ordered including Cardizem as well as Eliquis.  Patient's heart rate has been stable here.  Patient will not be cardioverted at this time and has been given follow-up in the A-fib clinic.  Family at bedside and are agreeable to this   CRITICAL CARE Performed by: Leota Jacobsen Total critical care time: 75 minutes Critical care time was exclusive of separately billable procedures and treating other patients. Critical care was necessary to treat or prevent imminent or life-threatening deterioration. Critical care was time spent personally by me on the following activities: development of treatment plan with patient and/or surrogate as well as nursing, discussions with consultants, evaluation of patient's response to treatment, examination of patient, obtaining history from patient or surrogate, ordering and performing treatments and interventions, ordering and review of laboratory studies, ordering and review of radiographic studies, pulse oximetry and re-evaluation of patient's condition.        Final Clinical Impression(s) / ED Diagnoses Final diagnoses:  None    Rx / DC Orders ED Discharge Orders     None         Lacretia Leigh, MD 05/03/22 1314

## 2022-05-03 NOTE — Consult Note (Addendum)
Cardiology Consultation   [name] MRN: [id number]; DOB: [birth year]  Admit date: 05/03/2022 Date of Consult: 05/03/2022  PCP:  Mosie Lukes, MD   [place] Providers Cardiologist:  None     Patient Profile:   Rebecca Brooks is a 71 y.o. female with a hx of HTN, hypothyroidism, restless leg syndrome who is being seen 05/03/2022 for the evaluation of atrial fibrillation at the request of Dr. Zenia Resides.  History of Present Illness:   Rebecca Brooks is a 72 year old female with above medical history. Per chart review, patient was admitted to [facility] in 07/2015 for evaluation of chest pain. Underwent nuclear stress test on 07/29/2015 that showed a moderate-size area of reversible perfusion involving the mid anterior, anterolateral, and inferior wall suspicious for myocardial ischemia. She later had a left heart catheterization on 08/23/2015 that showed angiographically normal coronary arteries.  Patient's PCP noted a murmur on physical exam in 03/2022. Echocardiogram from 04/10/2022 showed EF 60-65%, no regional wall motion abnormalities, normal LV diastolic parameters.   Patient called the answering service on 10/11 complaining of a rapid heart rate noted on her apple watch, chest pain, sob. Apple watch said that she was in afib. Patient was encouraged to go to the [facility] ED for evaluation. In the ED, EKG showed atrial fibrillation, HR 112 BPM. CXR showed no active cardiopulmonary diease. Labs showed Na 136, K 3.8, creatinine 0.77, WBC 9.6, hemoglobin 14.3, platelets 401.   Patient was started on IV diltiazem in the ED. HR improved to the 80s.   On interview, patient reports that she woke up this AM around 6 AM. She wears a CPAP at night, and felt like she was having a hard time breathing. After taking off her CPAP and sitting in bed for a few moments, patient became aware of some mild chest pressure located in the center of her chest. Also felt like  her heart was fluttering/racing. She put on her apple watch to check her pulse, told her that her HR was in the 120s and that she was in afib. She considered taking her husbands short acting cardizem, but called the answering service who told her to come to the ED for evaluation. She has had issues with palpitations in the past, but has never officially been diagnosed with afib.   Now that her heart rate is better controlled, she denies chest pain or sob. Does occasionally feel mild fluttering in her chest.   Past Medical History:  Diagnosis Date   Abnormal nuclear stress test    False positive. Cath 08/23/15 showed Angiographically normal coronary arteries   Anemia    Ankle fracture    Arthritis    Asthma    "asthmatic bronchitis"   Back pain    Chicken pox as a child   Edema of both lower extremities    Essential hypertension 07/17/2011   ACEI d/c 06/2011 for refractory cough> resolved    GERD (gastroesophageal reflux disease)    Hyperlipidemia    Hypertension    Hypothyroidism    Insomnia 11/01/2014   Insulin resistance    pt reports she was told this in the past, does not take any medication for this   Joint pain    Low back pain radiating to right leg    intermittent and occasional numbness of skin over right hip in certain positions   Measles as a child   Mumps as a child   Obesity  Osteoarthritis    Palpitation    Pneumonia    this October 2013 from which she has said inhailer   PONV (postoperative nausea and vomiting)    "violent vomiting after surgery"   Recurrent epistaxis 02/20/2012   RLS (restless legs syndrome) 11/01/2014   Skin cancer    Sleep apnea    SOB (shortness of breath) on exertion    Spinal stenosis    Vitamin B 12 deficiency    Vitamin D deficiency     Past Surgical History:  Procedure Laterality Date   CARDIAC CATHETERIZATION N/A 08/23/2015   Procedure: Left Heart Cath and Coronary Angiography;  Surgeon: [name], MD;  Location: [place] [facility];  Service: Cardiovascular;  Laterality: N/A;   CESAREAN SECTION     X 3   GASTRECTOMY  12/2017   INGUINAL HERNIA REPAIR Right 71 yrs old   LUMBAR LAMINECTOMY/DECOMPRESSION MICRODISCECTOMY Bilateral 10/21/2021   Procedure: Laminectomy and Foraminotomy - bilateral - Lumbar Three-Lumbar Four - Lumbar Four- Lumbar Five;  Surgeon: Earnie Larsson, MD;  Location: [place];  Service: Neurosurgery;  Laterality: Bilateral;   THYROIDECTOMY     total for benign tumor, Parathyroid spared   TONSILLECTOMY     TOTAL HIP ARTHROPLASTY Right 2006   secondary to congenital  hip defect       Inpatient Medications: Scheduled Meds:  Continuous Infusions:  diltiazem (CARDIZEM) infusion Stopped (05/03/22 1016)   lactated ringers 125 mL/hr at 05/03/22 1018   PRN Meds:   Allergies:    Allergies  Allergen Reactions   Bee Venom Anaphylaxis   Accupril [Quinapril Hcl] Cough   Quinapril-Hydrochlorothiazide Cough   Rosuvastatin Other (See Comments)    Leg aches.    Social History:   Social History   Socioeconomic History   Marital status: Married    Spouse name: Not on file   Number of children: 3   Years of education: Not on file   Highest education level: Not on file  Occupational History   Occupation: Training and development officer   Occupation: Primary school teacher   Occupation: Retired Adult nurse  Tobacco Use   Smoking status: Former    Packs/day: 0.50    Years: 5.00    Total pack years: 2.50    Types: Cigarettes    Quit date: 07/25/1971    Years since quitting: 50.8    Passive exposure: Past   Smokeless tobacco: Never  Vaping Use   Vaping Use: Never used  Substance and Sexual Activity   Alcohol use: Yes    Alcohol/week: 7.0 standard drinks of alcohol    Types: 7 Glasses of wine per week   Drug use: No   Sexual activity: Never    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  Other Topics Concern   Not on file  Social History Narrative   Not on file   Social Determinants of Health    Financial Resource Strain: Low Risk  (08/29/2021)   Overall Financial Resource Strain (CARDIA)    Difficulty of Paying Living Expenses: Not hard at all  Food Insecurity: No Food Insecurity (08/29/2021)   Hunger Vital Sign    Worried About Running Out of Food in the Last Year: Never true    [place] in the Last Year: Never true  Transportation Needs: No Transportation Needs (08/29/2021)   PRAPARE - Hydrologist (Medical): No    Lack of Transportation (Non-Medical): No  Physical Activity: Inactive (08/29/2021)   Exercise Vital Sign  Days of Exercise per Week: 0 days    Minutes of Exercise per Session: 0 min  Stress: No Stress Concern Present (08/29/2021)   [place]    Feeling of Stress : Not at all  Social Connections: Socially Integrated (08/29/2021)   Social Connection and Isolation Panel [NHANES]    Frequency of Communication with Friends and Family: Twice a week    Frequency of Social Gatherings with Friends and Family: More than three times a week    Attends Religious Services: More than 4 times per year    Active Member of [employer] or Organizations: Yes    Attends Music therapist: More than 4 times per year    Marital Status: Married  Human resources officer Violence: Not At Risk (08/29/2021)   Humiliation, Afraid, Rape, and Kick questionnaire    Fear of Current or Ex-Partner: No    Emotionally Abused: No    Physically Abused: No    Sexually Abused: No    Family History:    Family History  Problem Relation Age of Onset   Cancer Mother    Heart failure Mother    Lung cancer Mother 73       smoker   Heart Problems Mother        tachycardia   Hearing loss Mother    Anxiety disorder Mother        anxiety, claustrophobia   Obesity Mother    Osteoarthritis Father    Hyperlipidemia Father    Anuerysm Father        AAA   Sudden death Father    Hyperlipidemia Sister     Migraines Sister    Hypertension Sister    Heart Problems Sister        tachycardia   Colon cancer Maternal Grandmother    Heart attack Maternal Grandfather    Aneurysm Paternal Grandfather        abdominal   Heart disease Paternal Grandfather        AAA rupture, smoker     ROS:  Please see the history of present illness.   All other ROS reviewed and negative.     Physical Exam/Data:   Vitals:   05/03/22 0945 05/03/22 1015 05/03/22 1030 05/03/22 1100  BP: (!) 126/91 102/61 105/61 (!) 141/87  Pulse: 83 99 85 85  Resp: 19 (!) '22 20 19  '$ Temp: 98.4 F (36.9 C)     TempSrc: Oral     SpO2: (!) 84% 97% 98% 98%  Weight:      Height:       No intake or output data in the 24 hours ending 05/03/22 1136    05/03/2022    8:05 AM 03/28/2022    3:56 PM 03/10/2022    9:51 PM  Last 3 Weights  Weight (lbs) 250 lb 263 lb 255 lb  Weight (kg) 113.399 kg 119.296 kg 115.667 kg     Body mass index is 45 kg/m.  General:  Well nourished, well developed, in no acute distress. Laying flat in the bed  HEENT: normal Neck: no JVD Vascular: No carotid bruits; Radial pulses 2+ bilaterally Cardiac:  normal S1, S2; iRRR; faint systolic murmur at upper sternal boarder  Lungs:  clear to auscultation bilaterally, no wheezing, rhonchi or rales. Normal WOB on room air  Abd: soft, nontender, no hepatomegaly  Ext: no edema Musculoskeletal:  No deformities, BUE and BLE strength normal and equal Skin: warm and dry  Neuro:  CNs 2-12 intact, no focal abnormalities noted Psych:  Normal affect   EKG:  The EKG was personally reviewed and demonstrates:  Atrial fibrillation, HR 80 BPM Telemetry:  Telemetry was personally reviewed and demonstrates:  Atrial fibrillation, HR in the 80s   Relevant CV Studies: Echocardiogram 04/10/22   1. Left ventricular ejection fraction, by estimation, is 60 to 65%. The  left ventricle has normal function. The left ventricle has no regional  wall motion abnormalities. Left  ventricular diastolic parameters were  normal.   2. Right ventricular systolic function is normal. The right ventricular  size is normal. There is normal pulmonary artery systolic pressure.   3. Left atrial size was mildly dilated.   4. The mitral valve is normal in structure. No evidence of mitral valve  regurgitation. No evidence of mitral stenosis.   5. The aortic valve is tricuspid. There is mild calcification of the  aortic valve. Aortic valve regurgitation is not visualized. Aortic valve  sclerosis is present, with no evidence of aortic valve stenosis.   6. The inferior vena cava is normal in size with greater than 50%  respiratory variability, suggesting right atrial pressure of 3 mmHg.   Laboratory Data:  High Sensitivity Troponin:  No results for input(s): "TROPONINIHS" in the last 720 hours.   Chemistry Recent Labs  Lab 05/03/22 0839  NA 136  K 3.8  CL 101  CO2 25  GLUCOSE 146*  BUN 31*  CREATININE 0.77  CALCIUM 9.9  GFRNONAA >60  ANIONGAP 10    No results for input(s): "PROT", "ALBUMIN", "AST", "ALT", "ALKPHOS", "BILITOT" in the last 168 hours. Lipids No results for input(s): "CHOL", "TRIG", "HDL", "LABVLDL", "LDLCALC", "CHOLHDL" in the last 168 hours.  Hematology Recent Labs  Lab 05/03/22 0839  WBC 9.6  RBC 4.74  HGB 14.3  HCT 43.9  MCV 92.6  MCH 30.2  MCHC 32.6  RDW 12.6  PLT 401*   Thyroid No results for input(s): "TSH", "FREET4" in the last 168 hours.  BNPNo results for input(s): "BNP", "PROBNP" in the last 168 hours.  DDimer No results for input(s): "DDIMER" in the last 168 hours.   Radiology/Studies:  DG Chest 2 View  Result Date: 05/03/2022 CLINICAL DATA:  Rapid heart rate EXAM: CHEST - 2 VIEW COMPARISON:  Chest x-ray dated January 04, 2021 FINDINGS: The heart size and mediastinal contours are within normal limits. Both lungs are clear. Degenerative changes of the right-greater-than-left shoulders. IMPRESSION: No active cardiopulmonary disease.  Electronically Signed   By: Yetta Glassman M.D.   On: 05/03/2022 08:37     Assessment and Plan:   New onset Atrial Fibrillation  - Patient woke up this AM and had SOB, mild chest tightness/pressure, and a fluttering in her chest. Apple watch told her she was in afib with HR in the 120s - Now in IV diltiazem in the ED, HR has improved to the 80s. She continues to have occasionally fluttering in her chest, but denies pain or SOB.  - Echo from 04/10/22 showed EF 60-65%, no regional wall motion abnormalities, normal LV diastolic parameters, normal RV systolic function, mildly dilated LA - CHADS-VASc 3-- start Eliquis 5 mg BID. Patient denies any history of GI bleed, other bleeding issues. I encouraged patient to cut back on her use of celebrex to help prevent GI bleeding. Also encouraged her to monitor for signs of bleeding such as dark/bloody stools, blood in urine  - Start Cardizem CD 240 mg daily  - Stop  home metoprolol  - Encouraged compliance with CPAP and explained the relationship between afib and OSA  - I have arranged an outpatient follow up appointment. If patient continues to be in afib at that time, can arrange outpatient DCCV after 3 weeks of uninterrupted AC   HTN  - Start cardizem CD 240 mg daily - Stop home metoprolol  - Continue home hydrochlorothiazide 25 mg daily   OSA - Emphasized compliance with CPAP   Risk Assessment/Risk Scores:   CHA2DS2-VASc Score = 3  This indicates a 3.2% annual risk of stroke. The patient's score is based upon: CHF History: 0 HTN History: 1 Diabetes History: 0 Stroke History: 0 Vascular Disease History: 0 Age Score: 1 Gender Score: 1     For questions or updates, please contact [place] Please consult [email or web address] for contact info under    Signed, Margie Billet, PA-C  05/03/2022 11:36 AM

## 2022-05-03 NOTE — TOC Benefit Eligibility Note (Signed)
Patient Teacher, English as a foreign language completed.    The patient is currently admitted and upon discharge could be taking Eliquis 5 mg.  The current 30 day co-pay is $37.00.   The patient is currently admitted and upon discharge could be taking Xarelto 20 mg.  The current 30 day co-pay is $37.00.   The patient is insured through [place], [place] Patient Advocate Specialist [place] Patient Advocate Team Direct Number: [phone]  Fax: [phone]

## 2022-05-04 ENCOUNTER — Encounter: Payer: Self-pay | Admitting: Cardiology

## 2022-05-04 ENCOUNTER — Emergency Department (HOSPITAL_COMMUNITY): Payer: Medicare Other

## 2022-05-04 ENCOUNTER — Emergency Department (HOSPITAL_COMMUNITY)
Admission: EM | Admit: 2022-05-04 | Discharge: 2022-05-05 | Disposition: A | Discharge status: Home / Self Care | Payer: Medicare Other | Attending: Emergency Medicine | Admitting: Emergency Medicine

## 2022-05-04 ENCOUNTER — Telehealth: Payer: Self-pay | Admitting: Cardiology

## 2022-05-04 DIAGNOSIS — I4891 Unspecified atrial fibrillation: Secondary | ICD-10-CM | POA: Insufficient documentation

## 2022-05-04 DIAGNOSIS — I10 Essential (primary) hypertension: Secondary | ICD-10-CM | POA: Diagnosis not present

## 2022-05-04 DIAGNOSIS — J45909 Unspecified asthma, uncomplicated: Secondary | ICD-10-CM | POA: Insufficient documentation

## 2022-05-04 DIAGNOSIS — E039 Hypothyroidism, unspecified: Secondary | ICD-10-CM | POA: Insufficient documentation

## 2022-05-04 DIAGNOSIS — R002 Palpitations: Secondary | ICD-10-CM | POA: Diagnosis present

## 2022-05-04 DIAGNOSIS — Z85828 Personal history of other malignant neoplasm of skin: Secondary | ICD-10-CM | POA: Insufficient documentation

## 2022-05-04 DIAGNOSIS — R079 Chest pain, unspecified: Secondary | ICD-10-CM | POA: Diagnosis not present

## 2022-05-04 LAB — BASIC METABOLIC PANEL
Anion gap: 12 (ref 5–15)
BUN: 24 mg/dL — ABNORMAL HIGH (ref 8–23)
CO2: 26 mmol/L (ref 22–32)
Calcium: 9.6 mg/dL (ref 8.9–10.3)
Chloride: 101 mmol/L (ref 98–111)
Creatinine, Ser: 0.89 mg/dL (ref 0.44–1.00)
GFR, Estimated: >60 mL/min (ref >60)
Glucose, Bld: 118 mg/dL — ABNORMAL HIGH (ref 70–99)
Potassium: 3.9 mmol/L (ref 3.5–5.1)
Sodium: 139 mmol/L (ref 135–145)

## 2022-05-04 LAB — CBC
HCT: 42.9 % (ref 36.0–46.0)
Hemoglobin: 14.5 g/dL (ref 12.0–15.0)
MCH: 31.3 pg (ref 26.0–34.0)
MCHC: 33.8 g/dL (ref 30.0–36.0)
MCV: 92.7 fL (ref 80.0–100.0)
Platelets: 407 10*3/uL — ABNORMAL HIGH (ref 150–400)
RBC: 4.63 MIL/uL (ref 3.87–5.11)
RDW: 12.9 % (ref 11.5–15.5)
WBC: 10.4 10*3/uL (ref 4.0–10.5)
nRBC: 0 % (ref 0.0–0.2)

## 2022-05-04 LAB — TROPONIN I (HIGH SENSITIVITY): Troponin I (High Sensitivity): 5 ng/L (ref <18)

## 2022-05-04 NOTE — Telephone Encounter (Signed)
Spoke with Dr. Audie Box, who saw patient yesterday in ED He had advised outpatient med changes - stop HCTZ, resume meto tart '50mg'$  BID  Called patient to relay this and she reports her HR is up to 183bpm with minimal exertion - changing clothes. She would prefer to go on to hospital for eval and is OK being admitted if needed.

## 2022-05-04 NOTE — Telephone Encounter (Signed)
Spoke with patient (last seen by Dr. Debara Pickett in 2021 for lipids - saw Dr. Audie Box and Nunzio Cory PA in ED 10/11). She was dx'ed with AFib RVR yesterday in ED (see phone note from [place]). She was put on diltiazem and eliquis. She was told cardioversion may be scheduled in about 3 weeks. She had COVID on 9/25 which may have triggered this.   She said her heart states racing, short of breath, shaky whenever she gets up to do anything at all. She is very uncomfortable and unsettled with how she feels. She was not given any info on how she may feel.   She reports HR up to 135bpm with minimal activity. She is unable to locate BP cuff right now.   She reports she felt a little better for a short time after taking diltiazem this morning.   Advised will consult with MD and follow up ______  Dr. Harl Bowie (DOD) advised patient go to [facility] for eval. With HR in 130s and symptomatic, she may need TEE/DCCV, etc  Communicated case to [facility] (cardmaster)  Patient aware of recommendations and will have friend drive her to [facility] ED

## 2022-05-04 NOTE — Telephone Encounter (Signed)
New Message:     Patient was seen in the ER on yesterday(05-03-22)  for Afib.   Patient c/o Palpitations:  High priority if patient c/o lightheadedness, shortness of breath, or chest pain  How long have you had palpitations/irregular HR/ Afib? Are you having the symptoms now? Patient is in Afib at this time-- she says she feels worse when she moves around  Are you currently experiencing lightheadedness, SOB or CP? Shortness of breath when she moves around, pulse also goes up as high as 130- she feels shaky - she says she only feels better if she does not do anything.- her question is, what is she supposed to do until  she have a Cardioversion  in 3 weeks-, just sit around?  Do you have a history of afib (atrial fibrillation) or irregular heart rhythm? Never knew she had Afib  until she went to the ER yesterday -  Have you checked your BP or HR? (document readings if available): heart rate went up as high as 130  Are you experiencing any other symptoms?

## 2022-05-04 NOTE — ED Triage Notes (Addendum)
Pt seen at [facility] yesterday for SOB, CP. Found to be in new onset afib. Started on on eliquis- took yesterday and today. Took cardizem today as well- started today.Reports testing positive for COVID 9/25- took paxlovid starting 9/26. Coming in today because she continues to feel dizzy, SOB, and tired. Reports CP SOB worsening with any activity. Reports pulse 180 when up and moving- via apple watch.

## 2022-05-04 NOTE — ED Provider Triage Note (Signed)
Emergency Medicine Provider Triage Evaluation Note  Rebecca Brooks , a 71 y.o. female  was evaluated in triage.  Pt complains of continued shortness of breath and palpitations.  Seen in the ED yesterday, recently diagnosed with new onset A-fib.  Started on Eliquis and Cardizem.  However still notes that when she is active and moving around her heart rate can get up to 180 bpm per her Apple Watch.  Has to sit down and rest.  Still does not feel well.  Denies active chest pain or shortness of breath.  Review of Systems  Positive: See Negative: Above  Physical Exam  BP (!) 135/105 (BP Location: Left Arm)   Pulse (!) 102   Resp 18   Ht 5' 2.5" (1.588 m)   Wt 113 kg   SpO2 98%   BMI 44.84 kg/m  Gen:   Awake, no distress   Resp:  Normal effort, CTAB, equal chest rise MSK:   Moves extremities without difficulty  Other:  Irregularly irregular with rate over 100 bpm.  Chest non-TTP.  Sitting comfortably.  Medical Decision Making  Medically screening exam initiated at 7:39 PM.  Appropriate orders placed.  Rebecca Brooks was informed that the remainder of the evaluation will be completed by another provider, this initial triage assessment does not replace that evaluation, and the importance of remaining in the ED until their evaluation is complete.     Rebecca Rome, PA-C 47/42/59 [id number]

## 2022-05-05 ENCOUNTER — Telehealth: Payer: Self-pay | Admitting: Cardiovascular Disease

## 2022-05-05 ENCOUNTER — Other Ambulatory Visit: Payer: Self-pay | Admitting: Cardiovascular Disease

## 2022-05-05 LAB — TROPONIN I (HIGH SENSITIVITY): Troponin I (High Sensitivity): 5 ng/L (ref <18)

## 2022-05-05 MED ORDER — DILTIAZEM HCL-DEXTROSE 125-5 MG/125ML-% IV SOLN (PREMIX)
5.0000 mg/h | INTRAVENOUS | Status: DC
  Filled 2022-05-05: qty 125

## 2022-05-05 MED ORDER — METOPROLOL TARTRATE 50 MG PO TABS: 50.0000 mg | ORAL_TABLET | Freq: Two times a day (BID) | ORAL | 1 refills | Status: DC

## 2022-05-05 NOTE — Discharge Instructions (Signed)
Please call your Cardiology group today to discuss your symptoms and ED visit. Luckily, you have converted back to a normal heart rhythm. Please continue your home medications as prescribed.   Return to the ED with any chest pain, trouble breathing, passing out, or any other new or suddenly worsening symptoms.

## 2022-05-05 NOTE — H&P (Signed)
Called to assess pt for possible admission for TEE-DCCV; pt back in NSR upon assessment, HR in 70s. She will be discharged from the ED to f/u in AF clinic as scheduled

## 2022-05-05 NOTE — Telephone Encounter (Signed)
Patient c/o Palpitations:  High priority if patient c/o lightheadedness, shortness of breath, or chest pain  How long have you had palpitations/irregular HR/ Afib? Are you having the symptoms now?   This afternoon  Are you currently experiencing lightheadedness, SOB or CP?  No  Do you have a history of afib (atrial fibrillation) or irregular heart rhythm?  Thursday (yesterday) was the first time she had an incident  Have you checked your BP or HR? (document readings if available):   Average from Apple watch 87 bpm Range was 65-160  Are you experiencing any other symptoms?   Shaking, SOB   Patient stated when she moves around she gets palpitations and shortness of breath.

## 2022-05-05 NOTE — Telephone Encounter (Signed)
Patient states she can feel her heart racing during the day. It can go up to 120 for 30 seconds. She has sob with exertion and shakiness. "Out of breath just walking across room and has a flushed face. Reports being lightheaded, but doesn't feel like passing out. BP 134/97, P95. She was at ED twice and self-converted. Saw order from Dr. Audie Box to have patient stop HCTZ and to start metoprolol tartrate 50 mg twice daily. Patient advised of medication changes. Made appointment with Dr. Audie Box for 10/17. Recommended that if her heart continues to race with sob, to go to the ED. She verbalized understanding. Med order placed.

## 2022-05-05 NOTE — ED Provider Notes (Signed)
Emergency Department Provider Note   I have reviewed the triage vital signs and the nursing notes.   HISTORY  Chief Complaint Chest Pain   HPI Rebecca Brooks is a 71 y.o. female with past medical history reviewed below including hypertension, hypothyroid, new diagnosis of atrial fibrillation on 10/11 presents to the emergency department with continued heart palpitations, shakiness, exertional dyspnea.  She also has some associated chest tightness.  The symptoms initially brought her to the ER on 10/11 where she was diagnosed with A-fib and ultimately deemed appropriate for outpatient management.  She has been compliant with her anticoagulation over the past 2 days as well as her Cardizem.  At home she has had worsening heart palpitations.  She states that her Apple Watch has indicated heart rate in the 180 range at times and is having a lot of shortness of breath with ambulation.     Past Medical History:  Diagnosis Date   Abnormal nuclear stress test    False positive. Cath 08/23/15 showed Angiographically normal coronary arteries   Anemia    Ankle fracture    Arthritis    Asthma    "asthmatic bronchitis"   Back pain    Chicken pox as a child   Edema of both lower extremities    Essential hypertension 07/17/2011   ACEI d/c 06/2011 for refractory cough> resolved    GERD (gastroesophageal reflux disease)    Hyperlipidemia    Hypertension    Hypothyroidism    Insomnia 11/01/2014   Insulin resistance    pt reports she was told this in the past, does not take any medication for this   Joint pain    Low back pain radiating to right leg    intermittent and occasional numbness of skin over right hip in certain positions   Measles as a child   Mumps as a child   Obesity    Osteoarthritis    Palpitation    Pneumonia    this October 2013 from which she has said inhailer   PONV (postoperative nausea and vomiting)    "violent vomiting after surgery"   Recurrent epistaxis  02/20/2012   RLS (restless legs syndrome) 11/01/2014   Skin cancer    Sleep apnea    SOB (shortness of breath) on exertion    Spinal stenosis    Vitamin B 12 deficiency    Vitamin D deficiency     Review of Systems  Constitutional: No fever/chills Eyes: No visual changes. ENT: No sore throat. Cardiovascular: Positive chest pain and palpitations.  Respiratory: Positive shortness of breath. Gastrointestinal: No abdominal pain. Genitourinary: Negative for dysuria. Musculoskeletal: Negative for back pain. Skin: Negative for rash. Neurological: Negative for headaches.    ____________________________________________   PHYSICAL EXAM:  VITAL SIGNS: ED Triage Vitals  Enc Vitals Group     BP 05/04/22 1728 (!) 135/105     Pulse Rate 05/04/22 1728 (!) 102     Resp 05/04/22 1728 18     Temp 05/04/22 2214 98 F (36.7 C)     Temp Source 05/05/22 0237 Oral     SpO2 05/04/22 1728 100 %     Weight 05/04/22 1728 249 lb 1.9 oz (113 kg)     Height 05/04/22 1728 5' 2.5" (1.588 m)   Constitutional: Alert and oriented. Well appearing and in no acute distress. Eyes: Conjunctivae are normal.  Head: Atraumatic. Nose: No congestion/rhinnorhea. Mouth/Throat: Mucous membranes are moist.  Neck: No stridor.   Cardiovascular: A fib.  Good peripheral circulation. Grossly normal heart sounds.   Respiratory: Normal respiratory effort.  No retractions. Lungs CTAB. Gastrointestinal: Soft and nontender. No distention.  Musculoskeletal: No lower extremity tenderness with trace pedal edema. No gross deformities of extremities. Neurologic:  Normal speech and language.  Skin:  Skin is warm, dry and intact. No rash noted.  ____________________________________________   LABS (all labs ordered are listed, but only abnormal results are displayed)  Labs Reviewed  BASIC METABOLIC PANEL - Abnormal; Notable for the following components:      Result Value   Glucose, Bld 118 (*)    BUN 24 (*)    All other  components within normal limits  CBC - Abnormal; Notable for the following components:   Platelets 407 (*)    All other components within normal limits  TROPONIN I (HIGH SENSITIVITY)  TROPONIN I (HIGH SENSITIVITY)   ____________________________________________  EKG   EKG Interpretation  Date/Time:  Thursday May 04 2022 17:40:54 EDT Ventricular Rate:  121 PR Interval:    QRS Duration: 86 QT Interval:  328 QTC Calculation: 465 R Axis:   -50 Text Interpretation: Atrial fibrillation with rapid ventricular response Left axis deviation Anterior infarct , age undetermined Abnormal ECG When compared with ECG of 03-May-2022 10:16, PREVIOUS ECG IS PRESENT Confirmed by Nanda Quinton [phone]) on 05/05/2022 5:03:38 AM       Repeat EKG  EKG Interpretation  Date/Time:  Friday May 05 2022 05:28:20 EDT Ventricular Rate:  75 PR Interval:  144 QRS Duration: 94 QT Interval:  404 QTC Calculation: 451 R Axis:   -40 Text Interpretation: Normal sinus rhythm Left axis deviation Incomplete right bundle branch block Abnormal ECG When compared with ECG of 04-May-2022 17:40, PREVIOUS ECG IS PRESENT Confirmed by Nanda Quinton [phone]) on 05/05/2022 5:39:03 AM         ____________________________________________  RADIOLOGY  DG Chest 2 View  Result Date: 05/04/2022 CLINICAL DATA:  Chest pain EXAM: CHEST - 2 VIEW COMPARISON:  Chest radiograph dated 05/03/2022. FINDINGS: The heart size and mediastinal contours are within normal limits. Both lungs are clear. Degenerative changes are seen in the spine. Degenerative changes are seen in the right shoulder. IMPRESSION: No active cardiopulmonary disease. Electronically Signed   By: Zerita Boers M.D.   On: 05/04/2022 18:12    ____________________________________________   PROCEDURES  Procedure(s) performed:   Procedures  None ____________________________________________   INITIAL IMPRESSION / ASSESSMENT AND PLAN / ED COURSE  Pertinent  labs & imaging results that were available during my care of the patient were reviewed by me and considered in my medical decision making (see chart for details).   This patient is Presenting for Evaluation of CP, which does require a range of treatment options, and is a complaint that involves a high risk of morbidity and mortality.  The Differential Diagnoses includes but is not exclusive to acute coronary syndrome, aortic dissection, pulmonary embolism, cardiac tamponade, community-acquired pneumonia, pericarditis, musculoskeletal chest wall pain, etc.   I did obtain Additional Historical Information from husband at bedside.  I decided to review pertinent External Data, and in summary patient seen in the ED on 10/11 with a fib. D/c home with anticoagulation and diltiazem and has been compliant.   Clinical Laboratory Tests Ordered, included troponin WNL. No AKI. Normal electrolytes. No anemia.   Radiologic Tests Ordered, included CXR. I independently interpreted the images and agree with radiology interpretation.   Cardiac Monitor Tracing which shows A fib.    Social Determinants of Health Risk  patient is not an active smoker.   Consult complete with Cardiology. They will be down to evaluate in the ED/admit.   Medical Decision Making: Summary:  Patient presents emergency department symptomatic A-fib including exertional dyspnea and chest pressure along with palpitations. Patient not tolerating symptoms well at home. No acute changes in labs from today in comparison to prior.   Reevaluation with update and discussion with patient. She has converted back to NSR. Cardiology came to evaluate. Will monitor here in the ED and d/c with plan for close outpatient follow up.   06:35 AM  Patient remains in NSR. Asymptomatic. Will continue home medication and Cardiology follow up.   Considered admission but upon re-evaluation is now in NSR. Stable for d/c with outpatient follow up with  Cardiology.   Disposition: discharge  ____________________________________________  FINAL CLINICAL IMPRESSION(S) / ED DIAGNOSES  Final diagnoses:  Atrial fibrillation with RVR ([place])    Note:  This document was prepared using Dragon voice recognition software and may include unintentional dictation errors.  Nanda Quinton, MD, [facility]    Jadee Golebiewski, [facility], MD 05/05/22 [phone]

## 2022-05-05 NOTE — ED Notes (Signed)
Patient verbalizes understanding of d/c instructions. Opportunities for questions and answers were provided. Pt d/c from ED and wheeled to lobby with husband. 

## 2022-05-07 ENCOUNTER — Encounter: Payer: Self-pay | Admitting: Family Medicine

## 2022-05-08 ENCOUNTER — Other Ambulatory Visit: Payer: Self-pay | Admitting: Family Medicine

## 2022-05-08 MED ORDER — TRAMADOL HCL 50 MG PO TABS: 50.0000 mg | ORAL_TABLET | Freq: Three times a day (TID) | ORAL | 0 refills | Status: DC | PRN

## 2022-05-08 NOTE — Telephone Encounter (Signed)
Pt is requesting a medication refill of Tramadol   traMADol (ULTRAM) 50 MG tablet [[id number]]    Order Details Dose: 50 mg Route: Oral Frequency: 3 times daily PRN  Dispense Quantity: 90 tablet Refills: 0        Sig: Take 1 tablet (50 mg total) by mouth 3 (three) times daily as needed.  Patient taking differently: Take 50 mg by mouth 3 (three) times daily as needed for moderate pain.       Start Date: 03/17/22 End Date: --  Written Date: 03/17/22 Expiration Date: 09/13/22  Original Order:  traMADol (ULTRAM) 50 MG tablet [[id number]]    Pharmacy  [facility], [address] AT [address]  [place], [place] [address]     PLEASE advise on med refill. It was last filled on 03/17/22 with #90 No current UDS or Contract on file.

## 2022-05-08 NOTE — Progress Notes (Deleted)
Cardiology Office Note:   Date:  05/08/2022  NAME:  Rebecca Brooks    MRN: [id number] DOB:  October 27, 1950   PCP:  Mosie Lukes, MD  Cardiologist:  Minus Breeding, MD  Electrophysiologist:  None   Referring MD: Mosie Lukes, MD   No chief complaint on file. ***  History of Present Illness:   Rebecca Brooks is a 71 y.o. female with a hx of pAF, HTN, obesity who presents for follow-up.   Problem List Paroxysmal Afib  -Dx 05/03/2022 -CHADSVASC=3 (age, HTN, female) -Dana normal 2017 2. Obesity -BMI 44 3. HTN  Past Medical History: Past Medical History:  Diagnosis Date   Abnormal nuclear stress test    False positive. Cath 08/23/15 showed Angiographically normal coronary arteries   Anemia    Ankle fracture    Arthritis    Asthma    "asthmatic bronchitis"   Back pain    Chicken pox as a child   Edema of both lower extremities    Essential hypertension 07/17/2011   ACEI d/c 06/2011 for refractory cough> resolved    GERD (gastroesophageal reflux disease)    Hyperlipidemia    Hypertension    Hypothyroidism    Insomnia 11/01/2014   Insulin resistance    pt reports she was told this in the past, does not take any medication for this   Joint pain    Low back pain radiating to right leg    intermittent and occasional numbness of skin over right hip in certain positions   Measles as a child   Mumps as a child   Obesity    Osteoarthritis    Palpitation    Pneumonia    this October 2013 from which she has said inhailer   PONV (postoperative nausea and vomiting)    "violent vomiting after surgery"   Recurrent epistaxis 02/20/2012   RLS (restless legs syndrome) 11/01/2014   Skin cancer    Sleep apnea    SOB (shortness of breath) on exertion    Spinal stenosis    Vitamin B 12 deficiency    Vitamin D deficiency     Past Surgical History: Past Surgical History:  Procedure Laterality Date   CARDIAC CATHETERIZATION N/A 08/23/2015   Procedure: Left Heart  Cath and Coronary Angiography;  Surgeon: [name], MD;  Location: [facility];  Service: Cardiovascular;  Laterality: N/A;   CESAREAN SECTION     X 3   GASTRECTOMY  12/2017   INGUINAL HERNIA REPAIR Right 71 yrs old   LUMBAR LAMINECTOMY/DECOMPRESSION MICRODISCECTOMY Bilateral 10/21/2021   Procedure: Laminectomy and Foraminotomy - bilateral - Lumbar Three-Lumbar Four - Lumbar Four- Lumbar Five;  Surgeon: Earnie Larsson, MD;  Location: [facility];  Service: Neurosurgery;  Laterality: Bilateral;   THYROIDECTOMY     total for benign tumor, Parathyroid spared   TONSILLECTOMY     TOTAL HIP ARTHROPLASTY Right 2006   secondary to congenital  hip defect    Current Medications: No outpatient medications have been marked as taking for the 05/09/22 encounter (Appointment) with O'Neal, Cassie Freer, MD.     Allergies:    Bee venom, Accupril [quinapril hcl], Quinapril-hydrochlorothiazide, and Rosuvastatin   Social History: Social History   Socioeconomic History   Marital status: Married    Spouse name: Not on file   Number of children: 3   Years of education: Not on file   Highest education level: Not on file  Occupational History   Occupation: Training and development officer  Occupation: Primary school teacher   Occupation: Retired Adult nurse  Tobacco Use   Smoking status: Former    Packs/day: 0.50    Years: 5.00    Total pack years: 2.50    Types: Cigarettes    Quit date: 07/25/1971    Years since quitting: 50.8    Passive exposure: Past   Smokeless tobacco: Never  Vaping Use   Vaping Use: Never used  Substance and Sexual Activity   Alcohol use: Yes    Alcohol/week: 7.0 standard drinks of alcohol    Types: 7 Glasses of wine per week   Drug use: No   Sexual activity: Never    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  Other Topics Concern   Not on file  Social History Narrative   Not on file   Social Determinants of Health   Financial Resource Strain: Low Risk  (08/29/2021)   Overall  Financial Resource Strain (CARDIA)    Difficulty of Paying Living Expenses: Not hard at all  Food Insecurity: No Food Insecurity (08/29/2021)   Hunger Vital Sign    Worried About Running Out of Food in the Last Year: Never true    [place] in the Last Year: Never true  Transportation Needs: No Transportation Needs (08/29/2021)   PRAPARE - Hydrologist (Medical): No    Lack of Transportation (Non-Medical): No  Physical Activity: Inactive (08/29/2021)   Exercise Vital Sign    Days of Exercise per Week: 0 days    Minutes of Exercise per Session: 0 min  Stress: No Stress Concern Present (08/29/2021)   [place]    Feeling of Stress : Not at all  Social Connections: [place] (08/29/2021)   Social Connection and Isolation Panel [NHANES]    Frequency of Communication with Friends and Family: Twice a week    Frequency of Social Gatherings with Friends and Family: More than three times a week    Attends Religious Services: More than 4 times per year    Active Member of [employer] or Organizations: Yes    Attends Music therapist: More than 4 times per year    Marital Status: Married     Family History: The patient's ***family history includes Aneurysm in her paternal grandfather; Anuerysm in her father; Anxiety disorder in her mother; Cancer in her mother; Colon cancer in her maternal grandmother; Hearing loss in her mother; Heart Problems in her mother and sister; Heart attack in her maternal grandfather; Heart disease in her paternal grandfather; Heart failure in her mother; Hyperlipidemia in her father and sister; Hypertension in her sister; Lung cancer (age of onset: 22) in her mother; Migraines in her sister; Obesity in her mother; Osteoarthritis in her father; Sudden death in her father.  ROS:   All other ROS reviewed and negative. Pertinent positives noted in the HPI.      EKGs/Labs/Other Studies Reviewed:   The following studies were personally reviewed by me today:  EKG:  EKG is *** ordered today.  The ekg ordered today demonstrates ***, and was personally reviewed by me.   TTE 04/10/2022  1. Left ventricular ejection fraction, by estimation, is 60 to 65%. The  left ventricle has normal function. The left ventricle has no regional  wall motion abnormalities. Left ventricular diastolic parameters were  normal.   2. Right ventricular systolic function is normal. The right ventricular  size is normal. There is normal  pulmonary artery systolic pressure.   3. Left atrial size was mildly dilated.   4. The mitral valve is normal in structure. No evidence of mitral valve  regurgitation. No evidence of mitral stenosis.   5. The aortic valve is tricuspid. There is mild calcification of the  aortic valve. Aortic valve regurgitation is not visualized. Aortic valve  sclerosis is present, with no evidence of aortic valve stenosis.   6. The inferior vena cava is normal in size with greater than 50%  respiratory variability, suggesting right atrial pressure of 3 mmHg.   LHC 08/23/2015 Angiographically normal coronary arteries   Suspect noncardiac chest pain  Recent Labs: 03/28/2022: ALT 18; TSH 0.89 05/04/2022: BUN 24; Creatinine, Ser 0.89; Hemoglobin 14.5; Platelets 407; Potassium 3.9; Sodium 139   Recent Lipid Panel    Component Value Date/Time   CHOL 187 03/28/2022 1707   CHOL 200 (H) 01/26/2021 0934   TRIG 128.0 03/28/2022 1707   HDL 71.40 03/28/2022 1707   HDL 83 01/26/2021 0934   CHOLHDL 3 03/28/2022 1707   VLDL 25.6 03/28/2022 1707   LDLCALC 90 03/28/2022 1707   LDLCALC 105 (H) 01/26/2021 0934    Physical Exam:   VS:  There were no vitals taken for this visit.   Wt Readings from Last 3 Encounters:  05/04/22 249 lb 1.9 oz (113 kg)  05/03/22 250 lb (113.4 kg)  03/28/22 263 lb (119.3 kg)    General: Well nourished, well developed, in no acute  distress Head: Atraumatic, normal size  Eyes: PEERLA, EOMI  Neck: Supple, no JVD Endocrine: No thryomegaly Cardiac: Normal S1, S2; RRR; no murmurs, rubs, or gallops Lungs: Clear to auscultation bilaterally, no wheezing, rhonchi or rales  Abd: Soft, nontender, no hepatomegaly  Ext: No edema, pulses 2+ Musculoskeletal: No deformities, BUE and BLE strength normal and equal Skin: Warm and dry, no rashes   Neuro: Alert and oriented to person, place, time, and situation, CNII-XII grossly intact, no focal deficits  Psych: Normal mood and affect   ASSESSMENT:   Etty Isaac is a 71 y.o. female who presents for the following: No diagnosis found.  PLAN:   There are no diagnoses linked to this encounter.  {Are you ordering a CV Procedure (e.g. stress test, cath, DCCV, TEE, etc)?   Press F2        :[id number]}  Disposition: No follow-ups on file.  Medication Adjustments/Labs and Tests Ordered: Current medicines are reviewed at length with the patient today.  Concerns regarding medicines are outlined above.  No orders of the defined types were placed in this encounter.  No orders of the defined types were placed in this encounter.   There are no Patient Instructions on file for this visit.   Time Spent with Patient: I have spent a total of *** minutes with patient reviewing hospital notes, telemetry, EKGs, labs and examining the patient as well as establishing an assessment and plan that was discussed with the patient.  > 50% of time was spent in direct patient care.  Signed, Addison Naegeli. Audie Box, MD, [place]  [address], [place], [place] [address] [phone]  05/08/2022 11:58 AM

## 2022-05-09 ENCOUNTER — Ambulatory Visit: Payer: Medicare Other | Admitting: Cardiovascular Disease

## 2022-05-09 NOTE — Progress Notes (Deleted)
Office Visit Note  Patient: Rebecca Brooks             Date of Birth: January 22, 1951           MRN: [id number]             PCP: Mosie Lukes, MD Referring: Mosie Lukes, MD Visit Date: 05/23/2022 Occupation: '@GUAROCC'$ @  Subjective:  No chief complaint on file.   History of Present Illness: Rebecca Brooks is a 71 y.o. female ***   Activities of Daily Living:  Patient reports morning stiffness for *** {minute/hour:19697}.   Patient {ACTIONS;DENIES/REPORTS:21021675::"Denies"} nocturnal pain.  Difficulty dressing/grooming: {ACTIONS;DENIES/REPORTS:21021675::"Denies"} Difficulty climbing stairs: {ACTIONS;DENIES/REPORTS:21021675::"Denies"} Difficulty getting out of chair: {ACTIONS;DENIES/REPORTS:21021675::"Denies"} Difficulty using hands for taps, buttons, cutlery, and/or writing: {ACTIONS;DENIES/REPORTS:21021675::"Denies"}  No Rheumatology ROS completed.   PMFS History:  Patient Active Problem List   Diagnosis Date Noted  . Osteopenia 03/29/2022  . Gout 03/29/2022  . Murmur, cardiac 03/29/2022  . Foot pain 03/29/2022  . Lumbar stenosis with neurogenic claudication 10/21/2021  . Depression 02/23/2021  . Hyperglycemia 02/16/2020  . History of COVID-19 10/16/2018  . Atrophic vaginitis 05/14/2018  . OSA (obstructive sleep apnea) 04/26/2018  . Muscle cramps 01/02/2018  . IDA (iron deficiency anemia) 12/22/2016  . Primary osteoarthritis of both knees 10/03/2016  . DDD (degenerative disc disease), cervical 10/03/2016  . DDD (degenerative disc disease), lumbar 10/03/2016  . History of total hip replacement, right 10/03/2016  . History of anemia 10/03/2016  . Chondromalacia 10/03/2016  . Primary osteoarthritis of shoulder 10/03/2016  . Primary osteoarthritis of both hands 10/03/2016  . Abnormal nuclear stress test 08/23/2015  . Globus sensation 08/13/2015  . Encounter for screening mammogram for malignant neoplasm of breast 08/13/2015  . Chest pain at rest   .  Chest pain with moderate risk of acute coronary syndrome 07/28/2015  . Annual physical exam 07/11/2015  . RLS (restless legs syndrome) 11/01/2014  . Insomnia 11/01/2014  . Thoracic back pain 01/12/2014  . Allergic rhinitis 11/03/2013  . Preventative health care 12/13/2012  . Recurrent epistaxis 02/20/2012  . Shoulder pain, bilateral 11/01/2011  . Arthralgia 10/18/2011  . Peripheral edema 09/10/2011  . Asthma 08/19/2011  . Palpitations 08/01/2011  . Anemia 08/01/2011  . Hypokalemia 08/01/2011  . Elevated WBC count 08/01/2011  . GERD (gastroesophageal reflux disease)   . Low back pain radiating to right leg   . Class 3 severe obesity with serious comorbidity and body mass index (BMI) of 45.0 to 49.9 in adult ([place]) 07/17/2011  . Hypothyroidism 07/17/2011  . Hyperlipidemia 07/17/2011  . Essential hypertension 07/17/2011    Past Medical History:  Diagnosis Date  . Abnormal nuclear stress test    False positive. Cath 08/23/15 showed Angiographically normal coronary arteries  . Anemia   . Ankle fracture   . Arthritis   . Asthma    "asthmatic bronchitis"  . Back pain   . Chicken pox as a child  . Edema of both lower extremities   . Essential hypertension 07/17/2011   ACEI d/c 06/2011 for refractory cough> resolved   . GERD (gastroesophageal reflux disease)   . Hyperlipidemia   . Hypertension   . Hypothyroidism   . Insomnia 11/01/2014  . Insulin resistance    pt reports she was told this in the past, does not take any medication for this  . Joint pain   . Low back pain radiating to right leg    intermittent and occasional numbness of skin over right hip in  certain positions  . Measles as a child  . Mumps as a child  . Obesity   . Osteoarthritis   . Palpitation   . Pneumonia    this October 2013 from which she has said inhailer  . PONV (postoperative nausea and vomiting)    "violent vomiting after surgery"  . Recurrent epistaxis 02/20/2012  . RLS (restless legs syndrome)  11/01/2014  . Skin cancer   . Sleep apnea   . SOB (shortness of breath) on exertion   . Spinal stenosis   . Vitamin B 12 deficiency   . Vitamin D deficiency     Family History  Problem Relation Age of Onset  . Cancer Mother   . Heart failure Mother   . Lung cancer Mother 41       smoker  . Heart Problems Mother        tachycardia  . Hearing loss Mother   . Anxiety disorder Mother        anxiety, claustrophobia  . Obesity Mother   . Osteoarthritis Father   . Hyperlipidemia Father   . Anuerysm Father        AAA  . Sudden death Father   . Hyperlipidemia Sister   . Migraines Sister   . Hypertension Sister   . Heart Problems Sister        tachycardia  . Colon cancer Maternal Grandmother   . Heart attack Maternal Grandfather   . Aneurysm Paternal Grandfather        abdominal  . Heart disease Paternal Grandfather        AAA rupture, smoker   Past Surgical History:  Procedure Laterality Date  . CARDIAC CATHETERIZATION N/A 08/23/2015   Procedure: Left Heart Cath and Coronary Angiography;  Surgeon: [name], MD;  Location: [facility];  Service: Cardiovascular;  Laterality: N/A;  . CESAREAN SECTION     X 3  . GASTRECTOMY  12/2017  . INGUINAL HERNIA REPAIR Right 71 yrs old  . LUMBAR LAMINECTOMY/DECOMPRESSION MICRODISCECTOMY Bilateral 10/21/2021   Procedure: Laminectomy and Foraminotomy - bilateral - Lumbar Three-Lumbar Four - Lumbar Four- Lumbar Five;  Surgeon: Earnie Larsson, MD;  Location: [facility];  Service: Neurosurgery;  Laterality: Bilateral;  . THYROIDECTOMY     total for benign tumor, Parathyroid spared  . TONSILLECTOMY    . TOTAL HIP ARTHROPLASTY Right 2006   secondary to congenital  hip defect   Social History   Social History Narrative  . Not on file   Immunization History  Administered Date(s) Administered  . Fluad Quad(high Dose 65+) 04/19/2019  . Influenza Split 07/19/2012  . Influenza Whole 06/24/2011, 05/13/2013  . Influenza, High Dose Seasonal  PF 06/22/2017, 05/14/2018  . Influenza,inj,Quad PF,6+ Mos 06/03/2016  . Influenza-Unspecified 05/07/2014, 06/08/2015  . PFIZER(Purple Top)SARS-COV-2 Vaccination 08/12/2019, 09/03/2019, 06/05/2020  . Pneumococcal Conjugate-13 05/14/2018  . Pneumococcal Polysaccharide-23 07/18/2011  . Td 07/24/2005  . Tdap 08/01/2011  . Zoster, Live 07/23/2012     Objective: Vital Signs: There were no vitals taken for this visit.   Physical Exam   Musculoskeletal Exam: ***  CDAI Exam: CDAI Score: -- Patient Global: --; Provider Global: -- Swollen: --; Tender: -- Joint Exam 05/23/2022   No joint exam has been documented for this visit   There is currently no information documented on the homunculus. Go to the Rheumatology activity and complete the homunculus joint exam.  Investigation: No additional findings.  Imaging: DG Chest 2 View  Result Date: 05/04/2022 CLINICAL DATA:  Chest pain EXAM: CHEST - 2 VIEW COMPARISON:  Chest radiograph dated 05/03/2022. FINDINGS: The heart size and mediastinal contours are within normal limits. Both lungs are clear. Degenerative changes are seen in the spine. Degenerative changes are seen in the right shoulder. IMPRESSION: No active cardiopulmonary disease. Electronically Signed   By: Zerita Boers M.D.   On: 05/04/2022 18:12   DG Chest 2 View  Result Date: 05/03/2022 CLINICAL DATA:  Rapid heart rate EXAM: CHEST - 2 VIEW COMPARISON:  Chest x-ray dated January 04, 2021 FINDINGS: The heart size and mediastinal contours are within normal limits. Both lungs are clear. Degenerative changes of the right-greater-than-left shoulders. IMPRESSION: No active cardiopulmonary disease. Electronically Signed   By: Yetta Glassman M.D.   On: 05/03/2022 08:37   ECHOCARDIOGRAM COMPLETE  Result Date: 04/10/2022    ECHOCARDIOGRAM REPORT   Patient Name:   Dericka Ostenson Date of Exam: 04/10/2022 Medical Rec #:  [id number]            Height:       62.0 in Accession #:     [id number]           Weight:       263.0 lb Date of Birth:  [birth year]            BSA:          2.148 m Patient Age:    71 years             BP:           132/78 mmHg Patient Gender: F                    HR:           76 bpm. Exam Location:  [facility] Procedure: 2D Echo, Color Doppler, Cardiac Doppler and Intracardiac            Opacification Agent Indications:    Murmur R01.1  History:        Patient has prior history of Echocardiogram examinations, most                 recent 12/06/2016. Risk Factors:Hypertension and Dyslipidemia.  Sonographer:    Mikki Santee RDCS Referring Phys: Erline Levine A BLYTH IMPRESSIONS  1. Left ventricular ejection fraction, by estimation, is 60 to 65%. The left ventricle has normal function. The left ventricle has no regional wall motion abnormalities. Left ventricular diastolic parameters were normal.  2. Right ventricular systolic function is normal. The right ventricular size is normal. There is normal pulmonary artery systolic pressure.  3. Left atrial size was mildly dilated.  4. The mitral valve is normal in structure. No evidence of mitral valve regurgitation. No evidence of mitral stenosis.  5. The aortic valve is tricuspid. There is mild calcification of the aortic valve. Aortic valve regurgitation is not visualized. Aortic valve sclerosis is present, with no evidence of aortic valve stenosis.  6. The inferior vena cava is normal in size with greater than 50% respiratory variability, suggesting right atrial pressure of 3 mmHg. FINDINGS  Left Ventricle: Left ventricular ejection fraction, by estimation, is 60 to 65%. The left ventricle has normal function. The left ventricle has no regional wall motion abnormalities. Definity contrast agent was given IV to delineate the left ventricular  endocardial borders. The left ventricular internal cavity size was normal in size. There is no left ventricular hypertrophy. Left ventricular diastolic parameters were normal. Right  Ventricle: The right ventricular size is  normal. No increase in right ventricular wall thickness. Right ventricular systolic function is normal. There is normal pulmonary artery systolic pressure. The tricuspid regurgitant velocity is 2.32 m/s, and  with an assumed right atrial pressure of 8 mmHg, the estimated right ventricular systolic pressure is 25.8 mmHg. Left Atrium: Left atrial size was mildly dilated. Right Atrium: Right atrial size was normal in size. Pericardium: There is no evidence of pericardial effusion. Mitral Valve: The mitral valve is normal in structure. No evidence of mitral valve regurgitation. No evidence of mitral valve stenosis. Tricuspid Valve: The tricuspid valve is normal in structure. Tricuspid valve regurgitation is trivial. No evidence of tricuspid stenosis. Aortic Valve: The aortic valve is tricuspid. There is mild calcification of the aortic valve. Aortic valve regurgitation is not visualized. Aortic valve sclerosis is present, with no evidence of aortic valve stenosis. Pulmonic Valve: The pulmonic valve was normal in structure. Pulmonic valve regurgitation is not visualized. No evidence of pulmonic stenosis. Aorta: The aortic root is normal in size and structure. Venous: The inferior vena cava is normal in size with greater than 50% respiratory variability, suggesting right atrial pressure of 3 mmHg. IAS/Shunts: No atrial level shunt detected by color flow Doppler.  LEFT VENTRICLE PLAX 2D LVIDd:         4.90 cm      Diastology LVIDs:         3.40 cm      LV e' medial:    7.14 cm/s LV PW:         1.00 cm      LV E/e' medial:  14.4 LV IVS:        1.00 cm      LV e' lateral:   6.95 cm/s LVOT diam:     2.00 cm      LV E/e' lateral: 14.8 LV SV:         65 LV SV Index:   30 LVOT Area:     3.14 cm  LV Volumes (MOD) LV vol d, MOD A2C: 115.0 ml LV vol d, MOD A4C: 90.9 ml LV vol s, MOD A2C: 38.4 ml LV vol s, MOD A4C: 35.9 ml LV SV MOD A2C:     76.6 ml LV SV MOD A4C:     90.9 ml LV SV MOD BP:       64.4 ml RIGHT VENTRICLE RV Basal diam:  3.60 cm RV Mid diam:    2.70 cm RV S prime:     11.30 cm/s TAPSE (M-mode): 2.7 cm LEFT ATRIUM             Index        RIGHT ATRIUM           Index LA diam:        4.20 cm 1.96 cm/m   RA Area:     17.90 cm LA Vol (A2C):   55.5 ml 25.84 ml/m  RA Volume:   48.10 ml  22.40 ml/m LA Vol (A4C):   89.6 ml 41.72 ml/m LA Biplane Vol: 73.8 ml 34.36 ml/m  AORTIC VALVE LVOT Vmax:   90.50 cm/s LVOT Vmean:  60.000 cm/s LVOT VTI:    0.207 m  AORTA Ao Root diam: 2.90 cm Ao Asc diam:  3.50 cm MITRAL VALVE                TRICUSPID VALVE MV Area (PHT): 3.68 cm     TR Peak grad:   21.5 mmHg MV Decel Time: 206  msec     TR Vmax:        232.00 cm/s MV E velocity: 103.00 cm/s MV A velocity: 95.40 cm/s   SHUNTS MV E/A ratio:  1.08         Systemic VTI:  0.21 m                             Systemic Diam: 2.00 cm Jenkins Rouge MD Electronically signed by Jenkins Rouge MD Signature Date/Time: 04/10/2022/9:21:10 AM    Final     Recent Labs: Lab Results  Component Value Date   WBC 10.4 05/04/2022   HGB 14.5 05/04/2022   PLT 407 (H) 05/04/2022   NA 139 05/04/2022   K 3.9 05/04/2022   CL 101 05/04/2022   CO2 26 05/04/2022   GLUCOSE 118 (H) 05/04/2022   BUN 24 (H) 05/04/2022   CREATININE 0.89 05/04/2022   BILITOT 0.5 03/28/2022   ALKPHOS 87 03/28/2022   AST 21 03/28/2022   ALT 18 03/28/2022   PROT 6.9 03/28/2022   ALBUMIN 3.9 03/28/2022   CALCIUM 9.6 05/04/2022   GFRAA >60 12/21/2017    Speciality Comments: No specialty comments available.  Procedures:  No procedures performed Allergies: Bee venom, Accupril [quinapril hcl], Quinapril-hydrochlorothiazide, and Rosuvastatin   Assessment / Plan:     Visit Diagnoses: No diagnosis found.  Orders: No orders of the defined types were placed in this encounter.  No orders of the defined types were placed in this encounter.   Face-to-face time spent with patient was *** minutes. Greater than 50% of time was spent in  counseling and coordination of care.  Follow-Up Instructions: No follow-ups on file.   Earnestine Mealing, CMA  Note - This record has been created using Editor, commissioning.  Chart creation errors have been sought, but may not always  have been located. Such creation errors do not reflect on  the standard of medical care.

## 2022-05-15 ENCOUNTER — Ambulatory Visit: Payer: Medicare Other | Attending: Physician Assistant | Admitting: Physician Assistant

## 2022-05-15 VITALS — BP 104/68 | HR 67 | Ht 62.0 in | Wt 254.0 lb

## 2022-05-15 DIAGNOSIS — I10 Essential (primary) hypertension: Secondary | ICD-10-CM

## 2022-05-15 DIAGNOSIS — I480 Paroxysmal atrial fibrillation: Secondary | ICD-10-CM

## 2022-05-15 DIAGNOSIS — E785 Hyperlipidemia, unspecified: Secondary | ICD-10-CM

## 2022-05-15 NOTE — Patient Instructions (Signed)
Medication Instructions:  Your physician recommends that you continue on your current medications as directed. Please refer to the Current Medication list given to you today.  *If you need a refill on your cardiac medications before your next appointment, please call your pharmacy*  Lab Work: NONE ordered at this time of appointment   If you have labs (blood work) drawn today and your tests are completely normal, you will receive your results only by: [facility] (if you have MyChart) OR A paper copy in the mail If you have any lab test that is abnormal or we need to change your treatment, we will call you to review the results.  Testing/Procedures: NONE ordered at this time of appointment   Follow-Up: At [facility], you and your health needs are our priority.  As part of our continuing mission to provide you with exceptional heart care, we have created designated Provider Care Teams.  These Care Teams include your primary Cardiologist (physician) and Advanced Practice Providers (APPs -  Physician Assistants and Nurse Practitioners) who all work together to provide you with the care you need, when you need it.  Your next appointment:   3-4 month(s)  The format for your next appointment:   In Person  Provider:   Evalina Field, MD     Other Instructions  Important Information About Sugar

## 2022-05-15 NOTE — Progress Notes (Signed)
Cardiology Office Note:    Date:  05/17/2022   ID:  Rebecca Brooks, DOB July 20, 1951, MRN [id number]  PCP:  Mosie Lukes, MD   [place] [facility] Cardiologist:  Evalina Field, MD     Referring MD: Mosie Lukes, MD   Chief Complaint  Patient presents with   Follow-up    New onset of Afib.   Shortness of Breath   Edema    Legs, feet, and ankles.    History of Present Illness:    Rebecca Brooks is a 71 y.o. female with a hx of obesity, obstructive sleep apnea, PAF, hypertension, hyperlipidemia, and hypothyroidism.  Patient was seen in the ED on 05/03/2022 with new onset of atrial fibrillation with RVR.  She was started on Cardizem drip and there was rate controlled.  She was eventually discharged on Eliquis 5 mg twice a day and long-acting Cardizem 240 mg daily with plan to pursue outpatient cardioversion in 3 weeks with uninterrupted anticoagulation therapy.  Patient was returned back to the hospital on 05/05/2022 with a recurrent A-fib, cardiology service was called again to admit the patient, however by the time we saw the patient, she was already back in sinus rhythm, therefore was discharged from the emergency room.  Patient presents today for follow-up.  She has 1+ left ankle edema, no edema on the right side.  She says she was doing good losing weight, however her weight seems to have stuck in the past few weeks.  She need to lose additional 30 pounds and to get her BMI to less than 40 before she can undergo knee surgery.  She denies any recent chest pain.  With previous cardiac catheterization in 2017 showing normal coronary arteries, I do not think she need any ischemic work-up.  She has not had any recurrent atrial fibrillation.  Recent echocardiogram showed normal ejection fraction.  If she is planning to undergo knee surgery in the next few months, I think she is optimized to proceed.  Our clinical pharmacist will need to figure out the holding time  for Eliquis.  She is currently scheduled to see Dr. Audie Box in 7 days, I will push this back to 3 to 4 months from now as patient is quite stable.   Past Medical History:  Diagnosis Date   Abnormal nuclear stress test    False positive. Cath 08/23/15 showed Angiographically normal coronary arteries   Anemia    Ankle fracture    Arthritis    Asthma    "asthmatic bronchitis"   Back pain    Chicken pox as a child   Edema of both lower extremities    Essential hypertension 07/17/2011   ACEI d/c 06/2011 for refractory cough> resolved    GERD (gastroesophageal reflux disease)    Hyperlipidemia    Hypertension    Hypothyroidism    Insomnia 11/01/2014   Insulin resistance    pt reports she was told this in the past, does not take any medication for this   Joint pain    Low back pain radiating to right leg    intermittent and occasional numbness of skin over right hip in certain positions   Measles as a child   Mumps as a child   Obesity    Osteoarthritis    Palpitation    Pneumonia    this October 2013 from which she has said inhailer   PONV (postoperative nausea and vomiting)    "violent vomiting after surgery"  Recurrent epistaxis 02/20/2012   RLS (restless legs syndrome) 11/01/2014   Skin cancer    Sleep apnea    SOB (shortness of breath) on exertion    Spinal stenosis    Vitamin B 12 deficiency    Vitamin D deficiency     Past Surgical History:  Procedure Laterality Date   CARDIAC CATHETERIZATION N/A 08/23/2015   Procedure: Left Heart Cath and Coronary Angiography;  Surgeon: [name], MD;  Location: [facility];  Service: Cardiovascular;  Laterality: N/A;   CESAREAN SECTION     X 3   GASTRECTOMY  12/2017   INGUINAL HERNIA REPAIR Right 71 yrs old   LUMBAR LAMINECTOMY/DECOMPRESSION MICRODISCECTOMY Bilateral 10/21/2021   Procedure: Laminectomy and Foraminotomy - bilateral - Lumbar Three-Lumbar Four - Lumbar Four- Lumbar Five;  Surgeon: Earnie Larsson, MD;   Location: [facility];  Service: Neurosurgery;  Laterality: Bilateral;   THYROIDECTOMY     total for benign tumor, Parathyroid spared   TONSILLECTOMY     TOTAL HIP ARTHROPLASTY Right 2006   secondary to congenital  hip defect    Current Medications: Current Meds  Medication Sig   acetaminophen (TYLENOL) 500 MG tablet Take 1,000 mg by mouth in the morning and at bedtime.   albuterol (VENTOLIN HFA) 108 (90 Base) MCG/ACT inhaler Inhale 1-2 puffs into the lungs every 6 (six) hours as needed for wheezing or shortness of breath.   apixaban (ELIQUIS) 5 MG TABS tablet Take 1 tablet (5 mg total) by mouth 2 (two) times daily.   benzonatate (TESSALON) 200 MG capsule Take 1 capsule (200 mg total) by mouth 3 (three) times daily as needed for cough.   cetirizine (ZYRTEC) 10 MG tablet Take 1 tablet (10 mg total) by mouth daily.   cyclobenzaprine (FLEXERIL) 10 MG tablet Take 1 tablet (10 mg total) by mouth 3 (three) times daily as needed for muscle spasms.   diclofenac Sodium (VOLTAREN) 1 % GEL Apply 2-4 g topically 4 (four) times daily as needed. (Patient taking differently: Apply 2-4 g topically 4 (four) times daily as needed (for arthritis).)   diltiazem (CARDIZEM CD) 240 MG 24 hr capsule Take 1 capsule (240 mg total) by mouth daily.   famotidine (PEPCID) 20 MG tablet TAKE 1 TABLET(20 MG) BY MOUTH AT BEDTIME (Patient taking differently: Take 20 mg by mouth at bedtime.)   fluticasone (FLONASE) 50 MCG/ACT nasal spray Place 2 sprays into both nostrils at bedtime.   furosemide (LASIX) 20 MG tablet Take 1 tablet (20 mg total) by mouth daily as needed for fluid or edema (weight gain > 3#/24 hr).   levothyroxine (SYNTHROID) 100 MCG tablet TAKE 1 TABLET BY MOUTH EVERY DAY, BEFORE BREAKFAST (Patient taking differently: Take 100 mcg by mouth daily before breakfast.)   metoprolol tartrate (LOPRESSOR) 50 MG tablet Take 1 tablet (50 mg total) by mouth 2 (two) times daily.   pantoprazole (PROTONIX) 40 MG tablet TAKE 1  TABLET(40 MG) BY MOUTH DAILY (Patient taking differently: Take 40 mg by mouth daily.)   rOPINIRole (REQUIP) 1 MG tablet Take 1 tablet (1 mg total) by mouth at bedtime.   simvastatin (ZOCOR) 40 MG tablet Take 1 tablet (40 mg total) by mouth at bedtime.   traMADol (ULTRAM) 50 MG tablet Take 1 tablet (50 mg total) by mouth 3 (three) times daily as needed for moderate pain.   Vitamin D3 (VITAMIN D) 25 MCG tablet Take 1 tablet (1,000 Units total) by mouth daily.   zolpidem (AMBIEN) 10 MG tablet Take  1 tablet (10 mg total) by mouth at bedtime as needed for sleep.   [DISCONTINUED] celecoxib (CELEBREX) 100 MG capsule Take 1 capsule (100 mg total) by mouth 2 (two) times daily as needed.     Allergies:   Bee venom, Accupril [quinapril hcl], Quinapril-hydrochlorothiazide, and Rosuvastatin   Social History   Socioeconomic History   Marital status: Married    Spouse name: Not on file   Number of children: 3   Years of education: Not on file   Highest education level: Not on file  Occupational History   Occupation: Training and development officer   Occupation: Primary school teacher   Occupation: Retired Adult nurse  Tobacco Use   Smoking status: Former    Packs/day: 0.50    Years: 5.00    Total pack years: 2.50    Types: Cigarettes    Quit date: 07/25/1971    Years since quitting: 50.8    Passive exposure: Past   Smokeless tobacco: Never  Vaping Use   Vaping Use: Never used  Substance and Sexual Activity   Alcohol use: Yes    Alcohol/week: 7.0 standard drinks of alcohol    Types: 7 Glasses of wine per week   Drug use: No   Sexual activity: Never    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  Other Topics Concern   Not on file  Social History Narrative   Not on file   Social Determinants of Health   Financial Resource Strain: Low Risk  (08/29/2021)   Overall Financial Resource Strain (CARDIA)    Difficulty of Paying Living Expenses: Not hard at all  Food Insecurity: No Food Insecurity (08/29/2021)   Hunger  Vital Sign    Worried About Running Out of Food in the Last Year: Never true    [place] in the Last Year: Never true  Transportation Needs: No Transportation Needs (08/29/2021)   PRAPARE - Hydrologist (Medical): No    Lack of Transportation (Non-Medical): No  Physical Activity: Inactive (08/29/2021)   Exercise Vital Sign    Days of Exercise per Week: 0 days    Minutes of Exercise per Session: 0 min  Stress: No Stress Concern Present (08/29/2021)   [place]    Feeling of Stress : Not at all  Social Connections: [place] (08/29/2021)   Social Connection and Isolation Panel [NHANES]    Frequency of Communication with Friends and Family: Twice a week    Frequency of Social Gatherings with Friends and Family: More than three times a week    Attends Religious Services: More than 4 times per year    Active Member of [employer] or Organizations: Yes    Attends Music therapist: More than 4 times per year    Marital Status: Married     Family History: The patient's family history includes Aneurysm in her paternal grandfather; Anuerysm in her father; Anxiety disorder in her mother; Cancer in her mother; Colon cancer in her maternal grandmother; Hearing loss in her mother; Heart Problems in her mother and sister; Heart attack in her maternal grandfather; Heart disease in her paternal grandfather; Heart failure in her mother; Hyperlipidemia in her father and sister; Hypertension in her sister; Lung cancer (age of onset: 53) in her mother; Migraines in her sister; Obesity in her mother; Osteoarthritis in her father; Sudden death in her father.  ROS:   Please see the history of present illness.  All other systems reviewed and are negative.  EKGs/Labs/Other Studies Reviewed:    The following studies were reviewed today:  Echo 04/10/2022  1. Left ventricular ejection fraction, by  estimation, is 60 to 65%. The  left ventricle has normal function. The left ventricle has no regional  wall motion abnormalities. Left ventricular diastolic parameters were  normal.   2. Right ventricular systolic function is normal. The right ventricular  size is normal. There is normal pulmonary artery systolic pressure.   3. Left atrial size was mildly dilated.   4. The mitral valve is normal in structure. No evidence of mitral valve  regurgitation. No evidence of mitral stenosis.   5. The aortic valve is tricuspid. There is mild calcification of the  aortic valve. Aortic valve regurgitation is not visualized. Aortic valve  sclerosis is present, with no evidence of aortic valve stenosis.   6. The inferior vena cava is normal in size with greater than 50%  respiratory variability, suggesting right atrial pressure of 3 mmHg.   EKG:  EKG is ordered today.  The ekg ordered today demonstrates normal sinus rhythm, heart rate 67 bpm, left anterior fascicular block.  Recent Labs: 03/28/2022: ALT 18; TSH 0.89 05/04/2022: BUN 24; Creatinine, Ser 0.89; Hemoglobin 14.5; Platelets 407; Potassium 3.9; Sodium 139  Recent Lipid Panel    Component Value Date/Time   CHOL 187 03/28/2022 1707   CHOL 200 (H) 01/26/2021 0934   TRIG 128.0 03/28/2022 1707   HDL 71.40 03/28/2022 1707   HDL 83 01/26/2021 0934   CHOLHDL 3 03/28/2022 1707   VLDL 25.6 03/28/2022 1707   LDLCALC 90 03/28/2022 1707   LDLCALC 105 (H) 01/26/2021 0934     Risk Assessment/Calculations:    CHA2DS2-VASc Score = 3   This indicates a 3.2% annual risk of stroke. The patient's score is based upon: CHF History: 0 HTN History: 1 Diabetes History: 0 Stroke History: 0 Vascular Disease History: 0 Age Score: 1 Gender Score: 1          Physical Exam:    VS:  BP 104/68 (BP Location: Right Wrist, Patient Position: Sitting, Cuff Size: Normal)   Pulse 67   Ht '5\' 2"'$  (1.575 m)   Wt 254 lb (115.2 kg)   BMI 46.46 kg/m         Wt Readings from Last 3 Encounters:  05/15/22 254 lb (115.2 kg)  05/04/22 249 lb 1.9 oz (113 kg)  05/03/22 250 lb (113.4 kg)     GEN:  Well nourished, well developed in no acute distress HEENT: Normal NECK: No JVD; No carotid bruits LYMPHATICS: No lymphadenopathy CARDIAC: RRR, no murmurs, rubs, gallops RESPIRATORY:  Clear to auscultation without rales, wheezing or rhonchi  ABDOMEN: Soft, non-tender, non-distended MUSCULOSKELETAL:  No edema; No deformity  SKIN: Warm and dry NEUROLOGIC:  Alert and oriented x 3 PSYCHIATRIC:  Normal affect   ASSESSMENT:    1. PAF (paroxysmal atrial fibrillation) ([place])   2. Essential hypertension   3. Hyperlipidemia LDL goal <100    PLAN:    In order of problems listed above:  PAF: Patient was recently admitted with atrial fibrillation, initial plan was to continue rate control therapy for 3 weeks before cardioversion, however she has since went back to the emergency room, before she was seen by cardiology service again during the second ED visit, she self converted back to sinus rhythm.  She is currently maintaining sinus rhythm.  Continue Eliquis and diltiazem 240 mg long-acting dose.  Hypertension: Blood  pressure stable  Hyperlipidemia: On simvastatin           Medication Adjustments/Labs and Tests Ordered: Current medicines are reviewed at length with the patient today.  Concerns regarding medicines are outlined above.  Orders Placed This Encounter  Procedures   EKG 12-Lead   No orders of the defined types were placed in this encounter.   Patient Instructions  Medication Instructions:  Your physician recommends that you continue on your current medications as directed. Please refer to the Current Medication list given to you today.  *If you need a refill on your cardiac medications before your next appointment, please call your pharmacy*  Lab Work: NONE ordered at this time of appointment   If you have labs (blood work)  drawn today and your tests are completely normal, you will receive your results only by: [place] (if you have MyChart) OR A paper copy in the mail If you have any lab test that is abnormal or we need to change your treatment, we will call you to review the results.  Testing/Procedures: NONE ordered at this time of appointment   Follow-Up: At [facility], you and your health needs are our priority.  As part of our continuing mission to provide you with exceptional heart care, we have created designated Provider Care Teams.  These Care Teams include your primary Cardiologist (physician) and Advanced Practice Providers (APPs -  Physician Assistants and Nurse Practitioners) who all work together to provide you with the care you need, when you need it.  Your next appointment:   3-4 month(s)  The format for your next appointment:   In Person  Provider:   Evalina Field, MD     Other Instructions  Important Information About Sugar         Hilbert Corrigan, [place]  05/17/2022 10:55 PM    [place]

## 2022-05-23 ENCOUNTER — Ambulatory Visit: Payer: Medicare Other | Admitting: Rheumatology

## 2022-05-23 DIAGNOSIS — Z8639 Personal history of other endocrine, nutritional and metabolic disease: Secondary | ICD-10-CM

## 2022-05-23 DIAGNOSIS — M5030 Other cervical disc degeneration, unspecified cervical region: Secondary | ICD-10-CM

## 2022-05-23 DIAGNOSIS — M170 Bilateral primary osteoarthritis of knee: Secondary | ICD-10-CM

## 2022-05-23 DIAGNOSIS — Z96641 Presence of right artificial hip joint: Secondary | ICD-10-CM

## 2022-05-23 DIAGNOSIS — J4489 Other specified chronic obstructive pulmonary disease: Secondary | ICD-10-CM

## 2022-05-23 DIAGNOSIS — Z8679 Personal history of other diseases of the circulatory system: Secondary | ICD-10-CM

## 2022-05-23 DIAGNOSIS — Z862 Personal history of diseases of the blood and blood-forming organs and certain disorders involving the immune mechanism: Secondary | ICD-10-CM

## 2022-05-23 DIAGNOSIS — M19041 Primary osteoarthritis, right hand: Secondary | ICD-10-CM

## 2022-05-23 DIAGNOSIS — S92125S Nondisplaced fracture of body of left talus, sequela: Secondary | ICD-10-CM

## 2022-05-23 DIAGNOSIS — R609 Edema, unspecified: Secondary | ICD-10-CM

## 2022-05-23 DIAGNOSIS — M19011 Primary osteoarthritis, right shoulder: Secondary | ICD-10-CM

## 2022-05-23 DIAGNOSIS — F5101 Primary insomnia: Secondary | ICD-10-CM

## 2022-05-23 DIAGNOSIS — M8589 Other specified disorders of bone density and structure, multiple sites: Secondary | ICD-10-CM

## 2022-05-23 DIAGNOSIS — M5136 Other intervertebral disc degeneration, lumbar region: Secondary | ICD-10-CM

## 2022-05-24 ENCOUNTER — Ambulatory Visit: Payer: Medicare Other | Admitting: Cardiovascular Disease

## 2022-05-30 ENCOUNTER — Encounter: Payer: Self-pay | Admitting: Cardiovascular Disease

## 2022-05-30 NOTE — Telephone Encounter (Signed)
Looks like Dr. Audie Box decided to go with lasix.

## 2022-05-30 NOTE — Telephone Encounter (Signed)
Attempt to call patient to discuss-left detailed message.  Lasix 20 mg on med list-listed as PRN.   Advised to take daily if not already.

## 2022-05-31 DIAGNOSIS — M48062 Spinal stenosis, lumbar region with neurogenic claudication: Secondary | ICD-10-CM | POA: Diagnosis not present

## 2022-06-05 DIAGNOSIS — M25511 Pain in right shoulder: Secondary | ICD-10-CM | POA: Diagnosis not present

## 2022-06-14 NOTE — Telephone Encounter (Signed)
Called patient, scheduled her for a sooner appointment with [place] December 4th, 2023.   Patient verbalized understanding of appointment.

## 2022-06-20 ENCOUNTER — Encounter: Payer: Self-pay | Admitting: Family Medicine

## 2022-06-20 ENCOUNTER — Other Ambulatory Visit: Payer: Self-pay | Admitting: Family Medicine

## 2022-06-20 MED ORDER — TRAMADOL HCL 50 MG PO TABS: 50.0000 mg | ORAL_TABLET | Freq: Three times a day (TID) | ORAL | 0 refills | Status: DC | PRN

## 2022-06-22 NOTE — Progress Notes (Signed)
Cardiology Office Note:   Date:  06/26/2022  NAME:  Rebecca Brooks    MRN: [id number] DOB:  1950-08-05   PCP:  Mosie Lukes, MD  Cardiologist:  Evalina Field, MD  Electrophysiologist:  None   Referring MD: Mosie Lukes, MD   Chief Complaint  Patient presents with   Follow-up   History of Present Illness:   Rebecca Brooks is a 71 y.o. female with a hx of persistent atrial fibrillation, hypertension, obesity who presents for follow-up.  Recently seen in the emergency room in October 2023 with new onset atrial fibrillation.  Plan was for rate control strategy but she developed RVR.  Return to the emergency room but converted to sinus rhythm while in the ER.  Presents for follow-up today.  She is lost roughly 23 pounds since September.  She is maintaining sinus rhythm.  No recurrence of atrial fibrillation.  Remains on Eliquis.  She is exercising within limits due to bad knees.  Her weight loss endeavor is impressive.  She has plans to see her sleep medicine specialist tomorrow.  Our overall plan is to manage her atrial fibrillation conservatively.  She had no recurrence over the past month.  Problem List Persistent Atrial Fibrillation  -CHADSVASC=3 (age, HTN, female) -Dx 04/2022 -LHC 2017 normal 2. HTN 3. Obesity 4. OSA  Past Medical History: Past Medical History:  Diagnosis Date   Abnormal nuclear stress test    False positive. Cath 08/23/15 showed Angiographically normal coronary arteries   Anemia    Ankle fracture    Arthritis    Asthma    "asthmatic bronchitis"   Back pain    Chicken pox as a child   Edema of both lower extremities    Essential hypertension 07/17/2011   ACEI d/c 06/2011 for refractory cough> resolved    GERD (gastroesophageal reflux disease)    Hyperlipidemia    Hypertension    Hypothyroidism    Insomnia 11/01/2014   Insulin resistance    pt reports she was told this in the past, does not take any medication for this   Joint pain     Low back pain radiating to right leg    intermittent and occasional numbness of skin over right hip in certain positions   Measles as a child   Mumps as a child   Obesity    Osteoarthritis    Palpitation    Pneumonia    this October 2013 from which she has said inhailer   PONV (postoperative nausea and vomiting)    "violent vomiting after surgery"   Recurrent epistaxis 02/20/2012   RLS (restless legs syndrome) 11/01/2014   Skin cancer    Sleep apnea    SOB (shortness of breath) on exertion    Spinal stenosis    Vitamin B 12 deficiency    Vitamin D deficiency     Past Surgical History: Past Surgical History:  Procedure Laterality Date   CARDIAC CATHETERIZATION N/A 08/23/2015   Procedure: Left Heart Cath and Coronary Angiography;  Surgeon: [name], MD;  Location: [facility];  Service: Cardiovascular;  Laterality: N/A;   CESAREAN SECTION     X 3   GASTRECTOMY  12/2017   INGUINAL HERNIA REPAIR Right 71 yrs old   LUMBAR LAMINECTOMY/DECOMPRESSION MICRODISCECTOMY Bilateral 10/21/2021   Procedure: Laminectomy and Foraminotomy - bilateral - Lumbar Three-Lumbar Four - Lumbar Four- Lumbar Five;  Surgeon: Earnie Larsson, MD;  Location: [facility];  Service: Neurosurgery;  Laterality: Bilateral;  THYROIDECTOMY     total for benign tumor, Parathyroid spared   TONSILLECTOMY     TOTAL HIP ARTHROPLASTY Right 2006   secondary to congenital  hip defect    Current Medications: Current Meds  Medication Sig   acetaminophen (TYLENOL) 500 MG tablet Take 1,000 mg by mouth in the morning and at bedtime.   albuterol (VENTOLIN HFA) 108 (90 Base) MCG/ACT inhaler Inhale 1-2 puffs into the lungs every 6 (six) hours as needed for wheezing or shortness of breath.   apixaban (ELIQUIS) 5 MG TABS tablet Take 1 tablet (5 mg total) by mouth 2 (two) times daily.   benzonatate (TESSALON) 200 MG capsule Take 1 capsule (200 mg total) by mouth 3 (three) times daily as needed for cough.   cetirizine  (ZYRTEC) 10 MG tablet Take 1 tablet (10 mg total) by mouth daily.   cyclobenzaprine (FLEXERIL) 10 MG tablet Take 1 tablet (10 mg total) by mouth 3 (three) times daily as needed for muscle spasms.   diclofenac Sodium (VOLTAREN) 1 % GEL Apply 2-4 g topically 4 (four) times daily as needed. (Patient taking differently: Apply 2-4 g topically 4 (four) times daily as needed (for arthritis).)   diltiazem (CARDIZEM CD) 240 MG 24 hr capsule Take 1 capsule (240 mg total) by mouth daily.   famotidine (PEPCID) 20 MG tablet TAKE 1 TABLET(20 MG) BY MOUTH AT BEDTIME (Patient taking differently: Take 20 mg by mouth at bedtime.)   fluticasone (FLONASE) 50 MCG/ACT nasal spray Place 2 sprays into both nostrils at bedtime.   furosemide (LASIX) 20 MG tablet Take 1 tablet (20 mg total) by mouth daily as needed for fluid or edema (weight gain > 3#/24 hr).   levothyroxine (SYNTHROID) 100 MCG tablet TAKE 1 TABLET BY MOUTH EVERY DAY, BEFORE BREAKFAST (Patient taking differently: Take 100 mcg by mouth daily before breakfast.)   metoprolol tartrate (LOPRESSOR) 50 MG tablet Take 1 tablet (50 mg total) by mouth 2 (two) times daily.   pantoprazole (PROTONIX) 40 MG tablet TAKE 1 TABLET(40 MG) BY MOUTH DAILY (Patient taking differently: Take 40 mg by mouth daily.)   rOPINIRole (REQUIP) 1 MG tablet Take 1 tablet (1 mg total) by mouth at bedtime.   simvastatin (ZOCOR) 40 MG tablet Take 1 tablet (40 mg total) by mouth at bedtime.   traMADol (ULTRAM) 50 MG tablet Take 1 tablet (50 mg total) by mouth 3 (three) times daily as needed for moderate pain.   Vitamin D3 (VITAMIN D) 25 MCG tablet Take 1 tablet (1,000 Units total) by mouth daily.   zolpidem (AMBIEN) 10 MG tablet Take 1 tablet (10 mg total) by mouth at bedtime as needed for sleep.     Allergies:    Bee venom, Accupril [quinapril hcl], Quinapril-hydrochlorothiazide, and Rosuvastatin   Social History: Social History   Socioeconomic History   Marital status: Married     Spouse name: Not on file   Number of children: 3   Years of education: Not on file   Highest education level: Not on file  Occupational History   Occupation: Training and development officer   Occupation: Primary school teacher   Occupation: Retired Adult nurse  Tobacco Use   Smoking status: Former    Packs/day: 0.50    Years: 5.00    Total pack years: 2.50    Types: Cigarettes    Quit date: 07/25/1971    Years since quitting: 50.9    Passive exposure: Past   Smokeless tobacco: Never  Vaping Use   Vaping Use: Never used  Substance and Sexual Activity   Alcohol use: Yes    Alcohol/week: 7.0 standard drinks of alcohol    Types: 7 Glasses of wine per week   Drug use: No   Sexual activity: Never    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  Other Topics Concern   Not on file  Social History Narrative   Not on file   Social Determinants of Health   Financial Resource Strain: Low Risk  (08/29/2021)   Overall Financial Resource Strain (CARDIA)    Difficulty of Paying Living Expenses: Not hard at all  Food Insecurity: No Food Insecurity (08/29/2021)   Hunger Vital Sign    Worried About Running Out of Food in the Last Year: Never true    [place] in the Last Year: Never true  Transportation Needs: No Transportation Needs (08/29/2021)   PRAPARE - Hydrologist (Medical): No    Lack of Transportation (Non-Medical): No  Physical Activity: Inactive (08/29/2021)   Exercise Vital Sign    Days of Exercise per Week: 0 days    Minutes of Exercise per Session: 0 min  Stress: No Stress Concern Present (08/29/2021)   [place]    Feeling of Stress : Not at all  Social Connections: [place] (08/29/2021)   Social Connection and Isolation Panel [NHANES]    Frequency of Communication with Friends and Family: Twice a week    Frequency of Social Gatherings with Friends and Family: More than three times a week     Attends Religious Services: More than 4 times per year    Active Member of [employer] or Organizations: Yes    Attends Music therapist: More than 4 times per year    Marital Status: Married     Family History: The patient's family history includes Aneurysm in her paternal grandfather; Anuerysm in her father; Anxiety disorder in her mother; Cancer in her mother; Colon cancer in her maternal grandmother; Hearing loss in her mother; Heart Problems in her mother and sister; Heart attack in her maternal grandfather; Heart disease in her paternal grandfather; Heart failure in her mother; Hyperlipidemia in her father and sister; Hypertension in her sister; Lung cancer (age of onset: 30) in her mother; Migraines in her sister; Obesity in her mother; Osteoarthritis in her father; Sudden death in her father.  ROS:   All other ROS reviewed and negative. Pertinent positives noted in the HPI.     EKGs/Labs/Other Studies Reviewed:   The following studies were personally reviewed by me today:  TTE 04/10/2022  1. Left ventricular ejection fraction, by estimation, is 60 to 65%. The  left ventricle has normal function. The left ventricle has no regional  wall motion abnormalities. Left ventricular diastolic parameters were  normal.   2. Right ventricular systolic function is normal. The right ventricular  size is normal. There is normal pulmonary artery systolic pressure.   3. Left atrial size was mildly dilated.   4. The mitral valve is normal in structure. No evidence of mitral valve  regurgitation. No evidence of mitral stenosis.   5. The aortic valve is tricuspid. There is mild calcification of the  aortic valve. Aortic valve regurgitation is not visualized. Aortic valve  sclerosis is present, with no evidence of aortic valve stenosis.   6. The inferior vena cava is normal in size with greater than 50%  respiratory variability, suggesting right atrial pressure of  3 mmHg.    Recent  Labs: 03/28/2022: ALT 18; TSH 0.89 05/04/2022: BUN 24; Creatinine, Ser 0.89; Hemoglobin 14.5; Platelets 407; Potassium 3.9; Sodium 139   Recent Lipid Panel    Component Value Date/Time   CHOL 187 03/28/2022 1707   CHOL 200 (H) 01/26/2021 0934   TRIG 128.0 03/28/2022 1707   HDL 71.40 03/28/2022 1707   HDL 83 01/26/2021 0934   CHOLHDL 3 03/28/2022 1707   VLDL 25.6 03/28/2022 1707   LDLCALC 90 03/28/2022 1707   LDLCALC 105 (H) 01/26/2021 0934    Physical Exam:   VS:  BP (!) 147/80   Pulse 65   Ht 5' 2.25" (1.581 m)   Wt 240 lb 9.6 oz (109.1 kg)   SpO2 98%   BMI 43.65 kg/m    Wt Readings from Last 3 Encounters:  06/26/22 240 lb 9.6 oz (109.1 kg)  05/15/22 254 lb (115.2 kg)  05/04/22 249 lb 1.9 oz (113 kg)    General: Well nourished, well developed, in no acute distress Head: Atraumatic, normal size  Eyes: PEERLA, EOMI  Neck: Supple, no JVD Endocrine: No thryomegaly Cardiac: Normal S1, S2; RRR; no murmurs, rubs, or gallops Lungs: Clear to auscultation bilaterally, no wheezing, rhonchi or rales  Abd: Soft, nontender, no hepatomegaly  Ext: No edema, pulses 2+ Musculoskeletal: No deformities, BUE and BLE strength normal and equal Skin: Warm and dry, no rashes   Neuro: Alert and oriented to person, place, time, and situation, CNII-XII grossly intact, no focal deficits  Psych: Normal mood and affect   ASSESSMENT:   Rebecca Brooks is a 71 y.o. female who presents for the following: 1. Persistent atrial fibrillation ([place])   2. Essential hypertension   3. Obesity, morbid, BMI 40.0-49.9 ([place])     PLAN:   1. Persistent atrial fibrillation (HCC) -No recurrence.  Continue to monitor with Apple Watch.  Continue Eliquis 5 mg twice daily.  She has lost 23 pounds.  She is treating her sleep apnea.  Suspect these are her triggers.  We will continue with conservative approach.  She would be a good candidate for cardiac ablation if she chose this in the future.  Given the recurrence  we will hold on this.  She will continue diltiazem and metoprolol.  Her echo was normal.  Left heart cath in 2017 was normal.  2. Essential hypertension -Slightly elevated today.  Did not take medications.  3. Obesity, morbid, BMI 40.0-49.9 ([place]) -Continue with weight loss.  Disposition: Return in about 6 months (around 12/26/2022).  Medication Adjustments/Labs and Tests Ordered: Current medicines are reviewed at length with the patient today.  Concerns regarding medicines are outlined above.  No orders of the defined types were placed in this encounter.  No orders of the defined types were placed in this encounter.   Patient Instructions  Medication Instructions:  The current medical regimen is effective;  continue present plan and medications.  *If you need a refill on your cardiac medications before your next appointment, please call your pharmacy*   Follow-Up: At [facility], you and your health needs are our priority.  As part of our continuing mission to provide you with exceptional heart care, we have created designated Provider Care Teams.  These Care Teams include your primary Cardiologist (physician) and Advanced Practice Providers (APPs -  Physician Assistants and Nurse Practitioners) who all work together to provide you with the care you need, when you need it.  We recommend signing up for the  patient portal called "MyChart".  Sign up information is provided on this After Visit Summary.  MyChart is used to connect with patients for Virtual Visits (Telemedicine).  Patients are able to view lab/test results, encounter notes, upcoming appointments, etc.  Non-urgent messages can be sent to your provider as well.   To learn more about what you can do with MyChart, go to [email or web address].    Your next appointment:   6 month(s)  The format for your next appointment:   In Person  Provider:   Evalina Field, MD            Time Spent with Patient: I  have spent a total of 35 minutes with patient reviewing hospital notes, telemetry, EKGs, labs and examining the patient as well as establishing an assessment and plan that was discussed with the patient.  > 50% of time was spent in direct patient care.  Signed, Addison Naegeli. Audie Box, MD, [place]  [address] [place], [place] [address] [phone]  06/26/2022 9:48 AM

## 2022-06-23 ENCOUNTER — Encounter: Payer: Self-pay | Admitting: Cardiovascular Disease

## 2022-06-26 ENCOUNTER — Ambulatory Visit: Payer: Medicare Other | Attending: Cardiovascular Disease | Admitting: Cardiovascular Disease

## 2022-06-26 ENCOUNTER — Encounter: Payer: Self-pay | Admitting: Cardiovascular Disease

## 2022-06-26 VITALS — BP 147/80 | HR 65 | Ht 62.25 in | Wt 240.6 lb

## 2022-06-26 DIAGNOSIS — I10 Essential (primary) hypertension: Secondary | ICD-10-CM | POA: Diagnosis not present

## 2022-06-26 DIAGNOSIS — I4819 Other persistent atrial fibrillation: Secondary | ICD-10-CM

## 2022-06-26 NOTE — Patient Instructions (Signed)
Medication Instructions:  The current medical regimen is effective;  continue present plan and medications.  *If you need a refill on your cardiac medications before your next appointment, please call your pharmacy*   Follow-Up: At [facility], you and your health needs are our priority.  As part of our continuing mission to provide you with exceptional heart care, we have created designated Provider Care Teams.  These Care Teams include your primary Cardiologist (physician) and Advanced Practice Providers (APPs -  Physician Assistants and Nurse Practitioners) who all work together to provide you with the care you need, when you need it.  We recommend signing up for the patient portal called "MyChart".  Sign up information is provided on this After Visit Summary.  MyChart is used to connect with patients for Virtual Visits (Telemedicine).  Patients are able to view lab/test results, encounter notes, upcoming appointments, etc.  Non-urgent messages can be sent to your provider as well.   To learn more about what you can do with MyChart, go to [email or web address].    Your next appointment:   6 month(s)  The format for your next appointment:   In Person  Provider:   Ballard T O'Neal, MD        

## 2022-06-27 ENCOUNTER — Ambulatory Visit (INDEPENDENT_AMBULATORY_CARE_PROVIDER_SITE_OTHER): Payer: Medicare Other | Admitting: Pulmonary Disease

## 2022-06-27 ENCOUNTER — Encounter (HOSPITAL_BASED_OUTPATIENT_CLINIC_OR_DEPARTMENT_OTHER): Payer: Self-pay | Admitting: Pulmonary Disease

## 2022-06-27 VITALS — BP 116/68 | HR 61 | Temp 98.1°F | Ht 62.5 in | Wt 241.0 lb

## 2022-06-27 DIAGNOSIS — G4733 Obstructive sleep apnea (adult) (pediatric): Secondary | ICD-10-CM | POA: Diagnosis not present

## 2022-06-27 DIAGNOSIS — Z23 Encounter for immunization: Secondary | ICD-10-CM

## 2022-06-27 NOTE — Patient Instructions (Signed)
CPAP is working well Good luck with your weight loss program

## 2022-06-27 NOTE — Assessment & Plan Note (Signed)
CPAP download was reviewed which shows excellent control of events on auto settings 5 to 12 cm with average pressure of 12 cm.  She is very compliant and CPAP is certainly improved hypersomnolence and fatigue. She will continue on current settings, CPAP supplies will be renewed for a year. Otherwise states completely treated and does not seem to be a trigger for atrial fibrillation.  She has lost about 30 pounds since her original sleep study.  Goal weight loss is another 20 pounds that she can undergo knee replacement, if she achieves that goal, we will consider repeating sleep test to see if sleep disordered breathing is improved  Weight loss encouraged, compliance with goal of at least 4-6 hrs every night is the expectation. Advised against medications with sedative side effects Cautioned against driving when sleepy - understanding that sleepiness will vary on a day to day basis

## 2022-06-27 NOTE — Progress Notes (Signed)
Subjective:    [name], female    DOB: [birth year], 71 y.o.   MRN: [id number]  HPI  71 yo remote smoker for FU of OSA . She has been seen in the past for chronic cough and community-acquired pneumonia   PMH - gastric sleeve surgery 12/2017 at [facility]  Patient presents with   Consult    Pt states she was in hospital and was told that she needed to check on her sleep apnea.    Last office visit was 07/2018.  She presents to reestablish care today.  She had 2 ED visits for new onset atrial fibrillation.  I have reviewed cardiology consultation.  She did not require DCCV but converted to sinus rhythm, maintained on anticoagulation. She underwent gastric sleeve surgery in 2019 and lost some weight but has regained it.  She is now on a weight loss program called [employer] where they sent her food packets.  She is was 30 pounds in the last 3 months. Bedtime is between 11 and midnight, sleep latency is minimal, she sleeps with 1 pillow, reports nocturnal awakenings and is out of bed by 7 AM feeling rested without dryness of mouth or headaches. She is maintained on auto CPAP settings 5 to 12 cm, DME is [employer] There is no history suggestive of cataplexy, sleep paralysis or parasomnias   Significant tests/ events reviewed  NPSG 02/2018 >> AHI 27/h, wt 271 lbs   PFT's 09/2011 FEV1  2.04 (95%) ratio 78 and ERV 62 with DLCO 46%  Past Medical History:  Diagnosis Date   Abnormal nuclear stress test    False positive. Cath 08/23/15 showed Angiographically normal coronary arteries   Anemia    Ankle fracture    Arthritis    Asthma    "asthmatic bronchitis"   Back pain    Chicken pox as a child   Edema of both lower extremities    Essential hypertension 07/17/2011   ACEI d/c 06/2011 for refractory cough> resolved    GERD (gastroesophageal reflux disease)    Hyperlipidemia    Hypertension    Hypothyroidism    Insomnia 11/01/2014   Insulin resistance     pt reports she was told this in the past, does not take any medication for this   Joint pain    Low back pain radiating to right leg    intermittent and occasional numbness of skin over right hip in certain positions   Measles as a child   Mumps as a child   Obesity    Osteoarthritis    Palpitation    Pneumonia    this October 2013 from which she has said inhailer   PONV (postoperative nausea and vomiting)    "violent vomiting after surgery"   Recurrent epistaxis 02/20/2012   RLS (restless legs syndrome) 11/01/2014   Skin cancer    Sleep apnea    SOB (shortness of breath) on exertion    Spinal stenosis    Vitamin B 12 deficiency    Vitamin D deficiency     Past Surgical History:  Procedure Laterality Date   CARDIAC CATHETERIZATION N/A 08/23/2015   Procedure: Left Heart Cath and Coronary Angiography;  Surgeon: [name], MD;  Location: [facility];  Service: Cardiovascular;  Laterality: N/A;   CESAREAN SECTION     X 3   GASTRECTOMY  12/2017   INGUINAL HERNIA REPAIR Right 71 yrs old   LUMBAR LAMINECTOMY/DECOMPRESSION MICRODISCECTOMY Bilateral 10/21/2021  Procedure: Laminectomy and Foraminotomy - bilateral - Lumbar Three-Lumbar Four - Lumbar Four- Lumbar Five;  Surgeon: Earnie Larsson, MD;  Location: [facility];  Service: Neurosurgery;  Laterality: Bilateral;   THYROIDECTOMY     total for benign tumor, Parathyroid spared   TONSILLECTOMY     TOTAL HIP ARTHROPLASTY Right 2006   secondary to congenital  hip defect    Allergies  Allergen Reactions   Bee Venom Anaphylaxis   Accupril [Quinapril Hcl] Cough   Quinapril-Hydrochlorothiazide Cough   Rosuvastatin Other (See Comments)    Leg aches.   Social History   Socioeconomic History   Marital status: Married    Spouse name: Not on file   Number of children: 3   Years of education: Not on file   Highest education level: Not on file  Occupational History   Occupation: Training and development officer   Occupation: Primary school teacher   Occupation:  Retired Adult nurse  Tobacco Use   Smoking status: Former    Packs/day: 0.50    Years: 5.00    Total pack years: 2.50    Types: Cigarettes    Quit date: 07/25/1971    Years since quitting: 50.9    Passive exposure: Past   Smokeless tobacco: Never  Vaping Use   Vaping Use: Never used  Substance and Sexual Activity   Alcohol use: Yes    Alcohol/week: 7.0 standard drinks of alcohol    Types: 7 Glasses of wine per week   Drug use: No   Sexual activity: Never    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  Other Topics Concern   Not on file  Social History Narrative   Not on file   Social Determinants of Health   Financial Resource Strain: Low Risk  (08/29/2021)   Overall Financial Resource Strain (CARDIA)    Difficulty of Paying Living Expenses: Not hard at all  Food Insecurity: No Food Insecurity (08/29/2021)   Hunger Vital Sign    Worried About Running Out of Food in the Last Year: Never true    [place] in the Last Year: Never true  Transportation Needs: No Transportation Needs (08/29/2021)   PRAPARE - Hydrologist (Medical): No    Lack of Transportation (Non-Medical): No  Physical Activity: Inactive (08/29/2021)   Exercise Vital Sign    Days of Exercise per Week: 0 days    Minutes of Exercise per Session: 0 min  Stress: No Stress Concern Present (08/29/2021)   [place]    Feeling of Stress : Not at all  Social Connections: [place] (08/29/2021)   Social Connection and Isolation Panel [NHANES]    Frequency of Communication with Friends and Family: Twice a week    Frequency of Social Gatherings with Friends and Family: More than three times a week    Attends Religious Services: More than 4 times per year    Active Member of [employer] or Organizations: Yes    Attends Music therapist: More than 4 times per year    Marital Status: Married  Arboriculturist Violence: Not At Risk (08/29/2021)   Humiliation, Afraid, Rape, and Kick questionnaire    Fear of Current or Ex-Partner: No    Emotionally Abused: No    Physically Abused: No    Sexually Abused: No    Family History  Problem Relation Age of Onset   Cancer Mother    Heart failure Mother  Lung cancer Mother 63       smoker   Heart Problems Mother        tachycardia   Hearing loss Mother    Anxiety disorder Mother        anxiety, claustrophobia   Obesity Mother    Osteoarthritis Father    Hyperlipidemia Father    Anuerysm Father        AAA   Sudden death Father    Hyperlipidemia Sister    Migraines Sister    Hypertension Sister    Heart Problems Sister        tachycardia   Colon cancer Maternal Grandmother    Heart attack Maternal Grandfather    Aneurysm Paternal Grandfather        abdominal   Heart disease Paternal Grandfather        AAA rupture, smoker     Review of Systems neg for any significant sore throat, dysphagia, itching, sneezing, nasal congestion or excess/ purulent secretions, fever, chills, sweats, unintended wt loss, pleuritic or exertional cp, hempoptysis, orthopnea pnd or change in chronic leg swelling. Also denies presyncope, palpitations, heartburn, abdominal pain, nausea, vomiting, diarrhea or change in bowel or urinary habits, dysuria,hematuria, rash, arthralgias, visual complaints, headache, numbness weakness or ataxia.     Objective:   Physical Exam  Gen. Pleasant, obese, in no distress ENT - no lesions, no post nasal drip Neck: No JVD, no thyromegaly, no carotid bruits Lungs: no use of accessory muscles, no dullness to percussion, decreased without rales or rhonchi  Cardiovascular: Rhythm regular, heart sounds  normal, no murmurs or gallops, no peripheral edema Musculoskeletal: No deformities, no cyanosis or clubbing , no tremors       Assessment & Plan:

## 2022-07-07 ENCOUNTER — Other Ambulatory Visit: Payer: Self-pay | Admitting: Family Medicine

## 2022-07-08 ENCOUNTER — Other Ambulatory Visit: Payer: Self-pay | Admitting: Cardiovascular Disease

## 2022-07-31 ENCOUNTER — Other Ambulatory Visit: Payer: Self-pay | Admitting: Family Medicine

## 2022-08-07 ENCOUNTER — Encounter: Payer: Self-pay | Admitting: Family Medicine

## 2022-08-08 ENCOUNTER — Other Ambulatory Visit: Payer: Self-pay | Admitting: Family Medicine

## 2022-08-08 MED ORDER — TRAMADOL HCL 50 MG PO TABS: 50.0000 mg | ORAL_TABLET | Freq: Three times a day (TID) | ORAL | 0 refills | Status: DC | PRN

## 2022-08-10 ENCOUNTER — Encounter: Payer: Medicare Other | Admitting: Family Medicine

## 2022-08-11 ENCOUNTER — Other Ambulatory Visit: Payer: Self-pay | Admitting: Family Medicine

## 2022-08-15 ENCOUNTER — Encounter: Payer: Self-pay | Admitting: Family Medicine

## 2022-08-17 ENCOUNTER — Other Ambulatory Visit: Payer: Self-pay

## 2022-08-17 MED ORDER — CETIRIZINE HCL 10 MG PO TABS: ORAL_TABLET | ORAL | 1 refills | Status: DC

## 2022-08-17 NOTE — Telephone Encounter (Signed)
Called pt was advised and  Medication was sent.

## 2022-08-21 ENCOUNTER — Telehealth (INDEPENDENT_AMBULATORY_CARE_PROVIDER_SITE_OTHER): Payer: Medicare Other | Admitting: Family

## 2022-08-21 ENCOUNTER — Telehealth: Payer: Medicare Other

## 2022-08-21 DIAGNOSIS — Z8616 Personal history of COVID-19: Secondary | ICD-10-CM | POA: Diagnosis not present

## 2022-08-21 DIAGNOSIS — J069 Acute upper respiratory infection, unspecified: Secondary | ICD-10-CM | POA: Diagnosis not present

## 2022-08-21 MED ORDER — ALBUTEROL SULFATE HFA 108 (90 BASE) MCG/ACT IN AERS: 1.0000 | INHALATION_SPRAY | Freq: Four times a day (QID) | RESPIRATORY_TRACT | 0 refills | Status: DC | PRN

## 2022-08-21 MED ORDER — PREDNISONE 10 MG PO TABS: ORAL_TABLET | ORAL | 0 refills | Status: DC

## 2022-08-21 NOTE — Assessment & Plan Note (Signed)
New.  Suspect asthma component as well. Recommended prednisone taper, refill albuterol.  Mucinex prn. Pt is advised to go to the ER if she develops worsening SOB. Come see [place] for in person visit if symptoms worsen or if symptoms fail to improve.

## 2022-08-21 NOTE — Progress Notes (Signed)
MyChart Video Visit    Virtual Visit via Video Note   This visit type was conducted due to national recommendations for restrictions regarding the COVID-19 Pandemic (e.g. social distancing) in an effort to limit this patient's exposure and mitigate transmission in our community. This patient is at least at moderate risk for complications without adequate follow up. This format is felt to be most appropriate for this patient at this time. Physical exam was limited by quality of the video and audio technology used for the visit. CMA was able to get the patient set up on a video visit.  Patient location: Home Patient and provider in visit Provider location: Office  I discussed the limitations of evaluation and management by telemedicine and the availability of in person appointments. The patient expressed understanding and agreed to proceed.  Visit Date: 08/21/2022  Today's healthcare provider: Nance Pear, NP     Subjective:    [name], female    DOB: 22-Jan-1951, 72 y.o.   MRN: [id number]  Chief Complaint  Patient presents with   Nasal Congestion    Complains of "chest congestion"   Fatigue    Complains of feeling very tired   Wheezing    Wheezing  Associated symptoms include shortness of breath and sputum production.   Patient is in today for a video visit.   She complains of chest congestion, fatigue, and wheezing since 08/18/2022. She is coughing up phlegm. She gets SOB easily. She is UTD on flu vaccine. She tested negative for Covid-19 on 08/19/2022.    Past Medical History:  Diagnosis Date   Abnormal nuclear stress test    False positive. Cath 08/23/15 showed Angiographically normal coronary arteries   Anemia    Ankle fracture    Arthritis    Asthma    "asthmatic bronchitis"   Back pain    Chicken pox as a child   Edema of both lower extremities    Essential hypertension 07/17/2011   ACEI d/c 06/2011 for refractory cough>  resolved    GERD (gastroesophageal reflux disease)    Hyperlipidemia    Hypertension    Hypothyroidism    Insomnia 11/01/2014   Insulin resistance    pt reports she was told this in the past, does not take any medication for this   Joint pain    Low back pain radiating to right leg    intermittent and occasional numbness of skin over right hip in certain positions   Measles as a child   Mumps as a child   Obesity    Osteoarthritis    Palpitation    Pneumonia    this October 2013 from which she has said inhailer   PONV (postoperative nausea and vomiting)    "violent vomiting after surgery"   Recurrent epistaxis 02/20/2012   RLS (restless legs syndrome) 11/01/2014   Skin cancer    Sleep apnea    SOB (shortness of breath) on exertion    Spinal stenosis    Vitamin B 12 deficiency    Vitamin D deficiency     Past Surgical History:  Procedure Laterality Date   CARDIAC CATHETERIZATION N/A 08/23/2015   Procedure: Left Heart Cath and Coronary Angiography;  Surgeon: [name], MD;  Location: [facility];  Service: Cardiovascular;  Laterality: N/A;   CESAREAN SECTION     X 3   GASTRECTOMY  12/2017   INGUINAL HERNIA REPAIR Right 72 yrs old   LUMBAR LAMINECTOMY/DECOMPRESSION  MICRODISCECTOMY Bilateral 10/21/2021   Procedure: Laminectomy and Foraminotomy - bilateral - Lumbar Three-Lumbar Four - Lumbar Four- Lumbar Five;  Surgeon: Earnie Larsson, MD;  Location: [facility];  Service: Neurosurgery;  Laterality: Bilateral;   THYROIDECTOMY     total for benign tumor, Parathyroid spared   TONSILLECTOMY     TOTAL HIP ARTHROPLASTY Right 2006   secondary to congenital  hip defect    Family History  Problem Relation Age of Onset   Cancer Mother    Heart failure Mother    Lung cancer Mother 74       smoker   Heart Problems Mother        tachycardia   Hearing loss Mother    Anxiety disorder Mother        anxiety, claustrophobia   Obesity Mother    Osteoarthritis Father     Hyperlipidemia Father    Anuerysm Father        AAA   Sudden death Father    Hyperlipidemia Sister    Migraines Sister    Hypertension Sister    Heart Problems Sister        tachycardia   Colon cancer Maternal Grandmother    Heart attack Maternal Grandfather    Aneurysm Paternal Grandfather        abdominal   Heart disease Paternal Grandfather        AAA rupture, smoker    Social History   Socioeconomic History   Marital status: Married    Spouse name: Not on file   Number of children: 3   Years of education: Not on file   Highest education level: Not on file  Occupational History   Occupation: Training and development officer   Occupation: Primary school teacher   Occupation: Retired Adult nurse  Tobacco Use   Smoking status: Former    Packs/day: 0.50    Years: 5.00    Total pack years: 2.50    Types: Cigarettes    Quit date: 07/25/1971    Years since quitting: 51.1    Passive exposure: Past   Smokeless tobacco: Never  Vaping Use   Vaping Use: Never used  Substance and Sexual Activity   Alcohol use: Yes    Alcohol/week: 7.0 standard drinks of alcohol    Types: 7 Glasses of wine per week   Drug use: No   Sexual activity: Never    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  Other Topics Concern   Not on file  Social History Narrative   Not on file   Social Determinants of Health   Financial Resource Strain: Low Risk  (08/29/2021)   Overall Financial Resource Strain (CARDIA)    Difficulty of Paying Living Expenses: Not hard at all  Food Insecurity: No Food Insecurity (08/29/2021)   Hunger Vital Sign    Worried About Running Out of Food in the Last Year: Never true    [place] in the Last Year: Never true  Transportation Needs: No Transportation Needs (08/29/2021)   PRAPARE - Hydrologist (Medical): No    Lack of Transportation (Non-Medical): No  Physical Activity: Inactive (08/29/2021)   Exercise Vital Sign    Days of Exercise per Week: 0 days     Minutes of Exercise per Session: 0 min  Stress: No Stress Concern Present (08/29/2021)   [place]    Feeling of Stress : Not at all  Social Connections: [place] (08/29/2021)  Social Licensed conveyancer [NHANES]    Frequency of Communication with Friends and Family: Twice a week    Frequency of Social Gatherings with Friends and Family: More than three times a week    Attends Religious Services: More than 4 times per year    Active Member of [employer] or Organizations: Yes    Attends Music therapist: More than 4 times per year    Marital Status: Married  Human resources officer Violence: Not At Risk (08/29/2021)   Humiliation, Afraid, Rape, and Kick questionnaire    Fear of Current or Ex-Partner: No    Emotionally Abused: No    Physically Abused: No    Sexually Abused: No    Outpatient Medications Prior to Visit  Medication Sig Dispense Refill   acetaminophen (TYLENOL) 500 MG tablet Take 1,000 mg by mouth in the morning and at bedtime.     apixaban (ELIQUIS) 5 MG TABS tablet Take 1 tablet (5 mg total) by mouth 2 (two) times daily. 60 tablet 11   benzonatate (TESSALON) 200 MG capsule Take 1 capsule (200 mg total) by mouth 3 (three) times daily as needed for cough. 30 capsule 0   cetirizine (ZYRTEC) 10 MG tablet TAKE 1 TABLET(10 MG) BY MOUTH DAILY 90 tablet 1   cyclobenzaprine (FLEXERIL) 10 MG tablet Take 1 tablet (10 mg total) by mouth 3 (three) times daily as needed for muscle spasms. 30 tablet 0   diclofenac Sodium (VOLTAREN) 1 % GEL Apply 2-4 g topically 4 (four) times daily as needed. (Patient taking differently: Apply 2-4 g topically 4 (four) times daily as needed (for arthritis).) 350 g 5   diltiazem (CARDIZEM CD) 240 MG 24 hr capsule Take 1 capsule (240 mg total) by mouth daily. 90 capsule 3   famotidine (PEPCID) 20 MG tablet TAKE 1 TABLET(20 MG) BY MOUTH AT BEDTIME (Patient taking differently:  Take 20 mg by mouth at bedtime.) 90 tablet 1   fluticasone (FLONASE) 50 MCG/ACT nasal spray Place 2 sprays into both nostrils at bedtime. 16 g 5   furosemide (LASIX) 20 MG tablet Take 1 tablet (20 mg total) by mouth daily as needed for fluid or edema (weight gain > 3#/24 hr). 180 tablet 1   levothyroxine (SYNTHROID) 100 MCG tablet Take 1 tablet (100 mcg total) by mouth daily before breakfast. 90 tablet 0   metoprolol tartrate (LOPRESSOR) 50 MG tablet TAKE 1 TABLET(50 MG) BY MOUTH TWICE DAILY 180 tablet 3   pantoprazole (PROTONIX) 40 MG tablet TAKE 1 TABLET(40 MG) BY MOUTH DAILY 90 tablet 3   rOPINIRole (REQUIP) 1 MG tablet TAKE 1 TABLET(1 MG) BY MOUTH AT BEDTIME 90 tablet 0   simvastatin (ZOCOR) 40 MG tablet TAKE 1 TABLET(40 MG) BY MOUTH AT BEDTIME 90 tablet 3   traMADol (ULTRAM) 50 MG tablet Take 1 tablet (50 mg total) by mouth 3 (three) times daily as needed for moderate pain. 90 tablet 0   Vitamin D3 (VITAMIN D) 25 MCG tablet Take 1 tablet (1,000 Units total) by mouth daily. 60 tablet 1   zolpidem (AMBIEN) 10 MG tablet Take 1 tablet (10 mg total) by mouth at bedtime as needed for sleep. 30 tablet 2   albuterol (VENTOLIN HFA) 108 (90 Base) MCG/ACT inhaler Inhale 1-2 puffs into the lungs every 6 (six) hours as needed for wheezing or shortness of breath. 1 each 0   No facility-administered medications prior to visit.    Allergies  Allergen Reactions   Bee Venom Anaphylaxis  Accupril [Quinapril Hcl] Cough   Quinapril-Hydrochlorothiazide Cough   Rosuvastatin Other (See Comments)    Leg aches.    Review of Systems  HENT:  Positive for congestion.   Respiratory:  Positive for sputum production, shortness of breath and wheezing.        Objective:    Physical Exam Constitutional:      General: She is not in acute distress.    Appearance: Normal appearance. She is well-developed.  HENT:     Head: Normocephalic and atraumatic.     Right Ear: External ear normal.     Left Ear:  External ear normal.  Eyes:     General: No scleral icterus. Neck:     Thyroid: No thyromegaly.  Cardiovascular:     Rate and Rhythm: Normal rate and regular rhythm.     Heart sounds: Normal heart sounds. No murmur heard. Pulmonary:     Effort: Pulmonary effort is normal. No respiratory distress.     Breath sounds: Normal breath sounds. No wheezing.  Musculoskeletal:     Cervical back: Neck supple.  Skin:    General: Skin is warm and dry.  Neurological:     Mental Status: She is alert and oriented to person, place, and time.  Psychiatric:        Mood and Affect: Mood normal.        Behavior: Behavior normal.        Thought Content: Thought content normal.        Judgment: Judgment normal.     There were no vitals taken for this visit. Wt Readings from Last 3 Encounters:  06/27/22 241 lb (109.3 kg)  06/26/22 240 lb 9.6 oz (109.1 kg)  05/15/22 254 lb (115.2 kg)       Assessment & Plan:  Viral URI with cough Assessment & Plan: New.  Suspect asthma component as well. Recommended prednisone taper, refill albuterol.  Mucinex prn. Pt is advised to go to the ER if she develops worsening SOB. Come see [place] for in person visit if symptoms worsen or if symptoms fail to improve.    History of COVID-19 -     Albuterol Sulfate HFA; Inhale 1-2 puffs into the lungs every 6 (six) hours as needed for wheezing or shortness of breath.  Dispense: 1 each; Refill: 0  Other orders -     predniSONE; 4 tabs by mouth once daily for 2 days, then 3 tabs daily x 2 days, then 2 tabs daily x 2 days, then 1 tab daily x 2 days  Dispense: 20 tablet; Refill: 0     I discussed the assessment and treatment plan with the patient. The patient was provided an opportunity to ask questions and all were answered. The patient agreed with the plan and demonstrated an understanding of the instructions.   The patient was advised to call back or seek an in-person evaluation if the symptoms worsen or if the condition  fails to improve as anticipated.  Nance Pear, NP [facility] [place] (phone) [phone] (fax)  [place]    I,Shehryar Baig,acting as a scribe for Nance Pear, NP.,have documented all relevant documentation on the behalf of Nance Pear, NP,as directed by  Nance Pear, NP while in the presence of Nance Pear, NP.

## 2022-08-25 ENCOUNTER — Encounter: Payer: Self-pay | Admitting: Family Medicine

## 2022-08-25 MED ORDER — AZITHROMYCIN 250 MG PO TABS: ORAL_TABLET | ORAL | 0 refills | Status: AC

## 2022-08-29 ENCOUNTER — Ambulatory Visit: Payer: Medicare Other | Admitting: Family

## 2022-08-29 ENCOUNTER — Encounter: Payer: Self-pay | Admitting: Family Medicine

## 2022-08-30 DIAGNOSIS — H2511 Age-related nuclear cataract, right eye: Secondary | ICD-10-CM | POA: Diagnosis not present

## 2022-08-30 DIAGNOSIS — Z961 Presence of intraocular lens: Secondary | ICD-10-CM | POA: Diagnosis not present

## 2022-09-05 DIAGNOSIS — M25511 Pain in right shoulder: Secondary | ICD-10-CM | POA: Diagnosis not present

## 2022-09-07 NOTE — Progress Notes (Signed)
Office Visit Note  Patient: Rebecca Brooks             Date of Birth: July 17, 1951           MRN: [id number]             PCP: Mosie Lukes, MD Referring: Mosie Lukes, MD Visit Date: 09/21/2022 Occupation: '@GUAROCC'$ @  Subjective:  Pain in both knees  History of Present Illness: Rebecca Brooks is a 72 y.o. female with history of osteoarthritis, degenerative disc disease.  She states she continues to have pain and discomfort in her bilateral shoulders.  She has difficulty raising her right arm.  She has had a cortisone injection in her shoulder joint in January at [facility] office.  She continues to have pain and discomfort in her bilateral hands.  Her right total hip replacement is doing well.  She continues to have pain and discomfort in her bilateral knee joints.  She had cortisone injections in her bilateral knee joints by Dr. Sarajane Jews in October 2023.  She is having pain and discomfort in her knee joints now.  Her left ankle joint fracture has healed.  She states she started having pain and discomfort in her right foot history of 2023.  She was in a shoe for a while and then it improved.  She has intentional weight loss of 50 pounds since the last year.  She is planning to see Dr. Maureen Ralphs for possible total knee replacement in the future.  She continues to take Ultram for pain management.    Activities of Daily Living:  Patient reports morning stiffness for 30-45 minutes.   Patient Reports nocturnal pain.  Difficulty dressing/grooming: Denies Difficulty climbing stairs: Reports Difficulty getting out of chair: Reports  Difficulty using hands for taps, buttons, cutlery, and/or writing: Reports  Review of Systems  Constitutional: Negative.  Negative for fatigue.  HENT: Negative.  Negative for mouth sores and mouth dryness.   Eyes: Negative.  Negative for dryness.  Respiratory: Negative.  Negative for shortness of breath.   Cardiovascular: Negative.  Negative for chest  pain and palpitations.  Gastrointestinal: Negative.  Negative for blood in stool, constipation and diarrhea.  Endocrine: Negative.  Negative for increased urination.  Genitourinary:  Positive for involuntary urination.  Musculoskeletal:  Positive for joint pain, gait problem, joint pain, muscle weakness, morning stiffness and muscle tenderness. Negative for joint swelling, myalgias and myalgias.  Skin: Negative.  Negative for color change, rash, hair loss and sensitivity to sunlight.  Allergic/Immunologic: Negative.  Negative for susceptible to infections.  Neurological:  Negative for dizziness and headaches.  Hematological: Negative.  Negative for swollen glands.  Psychiatric/Behavioral: Negative.  Negative for depressed mood and sleep disturbance. The patient is not nervous/anxious.     PMFS History:  Patient Active Problem List   Diagnosis Date Noted   Viral URI with cough 08/21/2022   Osteopenia 03/29/2022   Gout 03/29/2022   Murmur, cardiac 03/29/2022   Foot pain 03/29/2022   Lumbar stenosis with neurogenic claudication 10/21/2021   Depression 02/23/2021   Hyperglycemia 02/16/2020   History of COVID-19 10/16/2018   Atrophic vaginitis 05/14/2018   OSA (obstructive sleep apnea) 04/26/2018   Muscle cramps 01/02/2018   IDA (iron deficiency anemia) 12/22/2016   Primary osteoarthritis of both knees 10/03/2016   DDD (degenerative disc disease), cervical 10/03/2016   DDD (degenerative disc disease), lumbar 10/03/2016   History of total hip replacement, right 10/03/2016   History of anemia 10/03/2016  Chondromalacia 10/03/2016   Primary osteoarthritis of shoulder 10/03/2016   Primary osteoarthritis of both hands 10/03/2016   Abnormal nuclear stress test 08/23/2015   Globus sensation 08/13/2015   Encounter for screening mammogram for malignant neoplasm of breast 08/13/2015   Chest pain at rest    Chest pain with moderate risk of acute coronary syndrome 07/28/2015   Annual  physical exam 07/11/2015   RLS (restless legs syndrome) 11/01/2014   Insomnia 11/01/2014   Thoracic back pain 01/12/2014   Allergic rhinitis 11/03/2013   Preventative health care 12/13/2012   Recurrent epistaxis 02/20/2012   Shoulder pain, bilateral 11/01/2011   Arthralgia 10/18/2011   Peripheral edema 09/10/2011   Asthma 08/19/2011   Palpitations 08/01/2011   Anemia 08/01/2011   Hypokalemia 08/01/2011   Elevated WBC count 08/01/2011   GERD (gastroesophageal reflux disease)    Low back pain radiating to right leg    Class 3 severe obesity with serious comorbidity and body mass index (BMI) of 45.0 to 49.9 in adult [facility]) 07/17/2011   Hypothyroidism 07/17/2011   Hyperlipidemia 07/17/2011   Essential hypertension 07/17/2011    Past Medical History:  Diagnosis Date   Abnormal nuclear stress test    False positive. Cath 08/23/15 showed Angiographically normal coronary arteries   Anemia    Ankle fracture    Arthritis    Asthma    "asthmatic bronchitis"   Atrial fibrillation ([place])    Back pain    Chicken pox as a child   Edema of both lower extremities    Essential hypertension 07/17/2011   ACEI d/c 06/2011 for refractory cough> resolved    GERD (gastroesophageal reflux disease)    Hyperlipidemia    Hypertension    Hypothyroidism    Insomnia 11/01/2014   Insulin resistance    pt reports she was told this in the past, does not take any medication for this   Joint pain    Low back pain radiating to right leg    intermittent and occasional numbness of skin over right hip in certain positions   Measles as a child   Mumps as a child   Obesity    Osteoarthritis    Palpitation    Pneumonia    this October 2013 from which she has said inhailer   PONV (postoperative nausea and vomiting)    "violent vomiting after surgery"   Recurrent epistaxis 02/20/2012   RLS (restless legs syndrome) 11/01/2014   Skin cancer    Sleep apnea    SOB (shortness of breath) on exertion    Spinal  stenosis    Vitamin B 12 deficiency    Vitamin D deficiency     Family History  Problem Relation Age of Onset   Cancer Mother    Heart failure Mother    Lung cancer Mother 49       smoker   Heart Problems Mother        tachycardia   Hearing loss Mother    Anxiety disorder Mother        anxiety, claustrophobia   Obesity Mother    Osteoarthritis Father    Hyperlipidemia Father    Anuerysm Father        AAA   Sudden death Father    Hyperlipidemia Sister    Migraines Sister    Hypertension Sister    Heart Problems Sister        tachycardia   Colon cancer Maternal Grandmother    Heart attack Maternal Grandfather  Aneurysm Paternal Grandfather        abdominal   Heart disease Paternal Grandfather        AAA rupture, smoker   Past Surgical History:  Procedure Laterality Date   CARDIAC CATHETERIZATION N/A 08/23/2015   Procedure: Left Heart Cath and Coronary Angiography;  Surgeon: [name], MD;  Location: [place] [facility];  Service: Cardiovascular;  Laterality: N/A;   CESAREAN SECTION     X 3   GASTRECTOMY  12/2017   INGUINAL HERNIA REPAIR Right 72 yrs old   LUMBAR LAMINECTOMY/DECOMPRESSION MICRODISCECTOMY Bilateral 10/21/2021   Procedure: Laminectomy and Foraminotomy - bilateral - Lumbar Three-Lumbar Four - Lumbar Four- Lumbar Five;  Surgeon: Earnie Larsson, MD;  Location: [place];  Service: Neurosurgery;  Laterality: Bilateral;   THYROIDECTOMY     total for benign tumor, Parathyroid spared   TONSILLECTOMY     TOTAL HIP ARTHROPLASTY Right 2006   secondary to congenital  hip defect   Social History   Social History Narrative   Not on file   Immunization History  Administered Date(s) Administered   Fluad Quad(high Dose 65+) 04/19/2019, 06/27/2022   Influenza Split 07/19/2012   Influenza Whole 06/24/2011, 05/13/2013   Influenza, High Dose Seasonal PF 06/22/2017, 05/14/2018   Influenza,inj,Quad PF,6+ Mos 06/03/2016   Influenza-Unspecified 05/07/2014, 06/08/2015    PFIZER(Purple Top)SARS-COV-2 Vaccination 08/12/2019, 09/03/2019, 06/05/2020   Pneumococcal Conjugate-13 05/14/2018   Pneumococcal Polysaccharide-23 07/18/2011   Td 07/24/2005   Tdap 08/01/2011   Zoster, Live 07/23/2012     Objective: Vital Signs: BP 131/75 (BP Location: Left Arm, Patient Position: Sitting, Cuff Size: Normal) Comment (BP Location): lower forearm  Pulse 90   Resp 16   Ht 5' 2.5" (1.588 m)   Wt 219 lb 9.6 oz (99.6 kg)   BMI 39.53 kg/m    Physical Exam Vitals and nursing note reviewed.  Constitutional:      Appearance: She is well-developed.  HENT:     Head: Normocephalic and atraumatic.  Eyes:     Conjunctiva/sclera: Conjunctivae normal.  Cardiovascular:     Rate and Rhythm: Normal rate and regular rhythm.     Heart sounds: Normal heart sounds.  Pulmonary:     Effort: Pulmonary effort is normal.     Breath sounds: Normal breath sounds.  Abdominal:     General: Bowel sounds are normal.     Palpations: Abdomen is soft.  Musculoskeletal:     Cervical back: Normal range of motion.  Lymphadenopathy:     Cervical: No cervical adenopathy.  Skin:    General: Skin is warm and dry.     Capillary Refill: Capillary refill takes less than 2 seconds.  Neurological:     Mental Status: She is alert and oriented to person, place, and time.  Psychiatric:        Behavior: Behavior normal.      Musculoskeletal Exam: Limited range of motion of the cervical spine.  Right shoulder joint abduction was about 90 degrees, forward flexion 90 degrees and limited internal rotation.  Left shoulder joint was in full range of motion.  Elbow joints and wrist joints with good range of motion.  She had bilateral PIP and DIP thickening with subluxation.  Hip joints were in good range of motion.  Right rhip joint is replaced.  She had discomfort range of motion of bilateral knee joints.  Thickening of the left ankle joint was noted.  There was no tenderness over ankles or MTPs.  CDAI  Exam: CDAI Score: --  Patient Global: --; Provider Global: -- Swollen: --; Tender: -- Joint Exam 09/21/2022   No joint exam has been documented for this visit   There is currently no information documented on the homunculus. Go to the Rheumatology activity and complete the homunculus joint exam.  Investigation: No additional findings.  Imaging: MM 3D SCREEN BREAST BILATERAL  Result Date: 09/21/2022 CLINICAL DATA:  Screening. EXAM: DIGITAL SCREENING BILATERAL MAMMOGRAM WITH TOMOSYNTHESIS AND CAD TECHNIQUE: Bilateral screening digital craniocaudal and mediolateral oblique mammograms were obtained. Bilateral screening digital breast tomosynthesis was performed. The images were evaluated with computer-aided detection. COMPARISON:  Previous exam(s). ACR Breast Density Category a: The breasts are almost entirely fatty. FINDINGS: There are no findings suspicious for malignancy. IMPRESSION: No mammographic evidence of malignancy. A result letter of this screening mammogram will be mailed directly to the patient. RECOMMENDATION: Screening mammogram in one year. (Code:SM-B-01Y) BI-RADS CATEGORY  1: Negative. Electronically Signed   By: Franki Cabot M.D.   On: 09/21/2022 09:05   DG Bone Density  Result Date: 09/19/2022 EXAM: DUAL X-RAY ABSORPTIOMETRY (DXA) FOR BONE MINERAL DENSITY IMPRESSION: Referring Physician:  Mosie Lukes Your patient completed a bone mineral density test using GE Lunar iDXA system (analysis version: 16). Technologist: lmn PATIENT: Name: Rebecca Brooks, Rebecca Brooks Patient ID: [id number] Birth Date: 29-Mar-1951 Height: 61.5 in. Sex: Female Measured: 09/19/2022 Weight: 221.4 lbs. Indications: Advanced Age, Albuterol, Caucasian, Eliquis, Estrogen Deficient, History of Fracture (Adult) (V15.51), Hypothyroid, Levothyroxine, LSpine Surgery, Pantoprazole, Pepcid, Postmenopausal Fractures: Right Foot Treatments: Vitamin D (E933.5) ASSESSMENT: The BMD measured at Femur Neck is 0.778 g/cm2 with a T-score  of -1.9. This patient is considered osteopenic/low bone mass according to [facility]) criteria. The quality of the exam is limited by patient body habitus. The lumbar spine was excluded due to degenerative changes as noted on previous exam. Right hip excluded due to surgical hardware. Site Region Measured Date Measured Age YA BMD Significant CHANGE T-score Left Femur Neck 09/19/2022 72.2 -1.8 0.778 g/cm2 * Left Femur Neck 08/18/2015 65.1 -1.2 0.866 g/cm2 Left Forearm Radius 33% 09/19/2022 72.2 -1.8 0.721 g/cm2 * Left Forearm Radius 33% 08/18/2015 65.1 -0.7 0.824 g/cm2 World Health Organization [facility]) criteria for post-menopausal, Caucasian Women: Normal       T-score at or above -1 SD Osteopenia   T-score between -1 and -2.5 SD Osteoporosis T-score at or below -2.5 SD RECOMMENDATION: 1. All patients should optimize calcium and vitamin D intake. 2. Consider FDA-approved medical therapies in postmenopausal women and men aged 23 years and older, based on the following: a. A hip or vertebral (clinical or morphometric) fracture. b. T-score = -2.5 at the femoral neck or spine after appropriate evaluation to exclude secondary causes. c. Low bone mass (T-score between -1.0 and -2.5 at the femoral neck or spine) and a 10-year probability of a hip fracture = 3% or a 10-year probability of a major osteoporosis-related fracture = 20% based on the [place]-adapted WHO algorithm. d. Clinician judgment and/or patient preferences may indicate treatment for people with 10-year fracture probabilities above or below these levels. FOLLOW-UP: Patients with diagnosis of osteoporosis or at high risk for fracture should have regular bone mineral density tests.? Patients eligible for Medicare are allowed routine testing every 2 years.? The testing frequency can be increased to one year for patients who have rapidly progressing disease, are receiving or discontinuing medical therapy to restore bone mass, or have additional risk  factors. I have reviewed this study and agree with the findings. Mark A. Thornton Papas,  M.D. [facility] FRAX* 10-year Probability of Fracture Based on femoral neck BMD: Femur (Left) Major Osteoporotic Fracture: 16.2% Hip Fracture:                2.9% Population:                  [place] (Caucasian) Risk Factors:                History of Fracture (Adult) (V15.51) *FRAX is a Materials engineer of the [employer] for Metabolic Bone Disease, a [place] (WHO) [facility]. ASSESSMENT: The probability of a major osteoporotic fracture is 16.2% within the next ten years. The probability of a hip fracture is 2.9% within the next ten years. I have reviewed this report and agree with the above findings. Mark A. Thornton Papas, M.D. [facility] Electronically Signed   By: Lavonia Dana M.D.   On: 09/19/2022 12:05    Recent Labs: Lab Results  Component Value Date   WBC 10.4 05/04/2022   HGB 14.5 05/04/2022   PLT 407 (H) 05/04/2022   NA 139 05/04/2022   K 3.9 05/04/2022   CL 101 05/04/2022   CO2 26 05/04/2022   GLUCOSE 118 (H) 05/04/2022   BUN 24 (H) 05/04/2022   CREATININE 0.89 05/04/2022   BILITOT 0.5 03/28/2022   ALKPHOS 87 03/28/2022   AST 21 03/28/2022   ALT 18 03/28/2022   PROT 6.9 03/28/2022   ALBUMIN 3.9 03/28/2022   CALCIUM 9.6 05/04/2022   GFRAA >60 12/21/2017    Speciality Comments: No specialty comments available.  Procedures:  Large Joint Inj: bilateral knee on 09/21/2022 2:11 PM Indications: pain Details: 27 G 1.5 in needle, medial approach  Arthrogram: No  Medications (Right): 1.5 mL lidocaine 1 %; 40 mg triamcinolone acetonide 40 MG/ML Aspirate (Right): 0 mL Medications (Left): 1.5 mL lidocaine 1 %; 40 mg triamcinolone acetonide 40 MG/ML Aspirate (Left): 0 mL Outcome: tolerated well, no immediate complications Procedure, treatment alternatives, risks and benefits explained, specific risks discussed. Consent was given by  the patient. Immediately prior to procedure a time out was called to verify the correct patient, procedure, equipment, support staff and site/side marked as required. Patient was prepped and draped in the usual sterile fashion.     Allergies: Bee venom, Accupril [quinapril hcl], Quinapril-hydrochlorothiazide, and Rosuvastatin   Assessment / Plan:     Visit Diagnoses: Primary osteoarthritis of both shoulders - Followed by Dr. Thedore Mins.she had limited range of motion of the right shoulder joint as described above.  Left shoulder was in full range of motion.  She had cortisone injection in her right shoulder joint in January which helped minimally per patient.  Primary osteoarthritis of both hands-she had bilateral IP and DIP thickening with subluxation of several of the PIP and DIP joints.  Joint protection muscle strengthening was discussed.  A handout on hand exercises was provided.  History of total hip replacement, right-she had no discomfort with range of motion.  Primary osteoarthritis of both knees - Bilateral knee joint injections on December 28, 2021 here and then in October 2023 by Dr. French Ana.  Patient requests repeat cortisone injections today.  She states she has been experiencing a lot of discomfort.  She takes tramadol for pain management.  She is planning to see Dr. Maureen Ralphs for total knee replacement in the future.  She had intentional weight loss of about 50 pounds since the last year per patient.  Per patient's request after  informed consent was obtained bilateral knee joints were injected with lidocaine and cortisone as described above.  Patient tolerated procedure well.  Postprocedure instructions were given.  Closed nondisplaced fracture of body of left talus, sequela - Chronic ankle joint thickening.  DDD (degenerative disc disease), cervical-she had limited lateral rotation  DDD (degenerative disc disease), lumbar -she continues to have lower back pain.  S/p laminectomy,  decompression and microdiscectomy by Dr. Trenton Gammon in March 2023.  Osteopenia of multiple sites -September 19, 2022 the BMD measured at Femur Neck is 0.778 g/cm2 with a T-score of -1.9.  DEXA results were reviewed with the patient.  Use of calcium rich diet and vitamin D was advised.  Regular exercise was advised.  History of hypertension-blood pressure was normal at 131/75 today.  She is on Cardizem CD and takes Lasix.  History of high cholesterol-she is on simvastatin.  Asthmatic bronchitis , chronic  History of hypothyroidism  Primary insomnia-she takes Ambien 10 mg p.o. nightly as needed insomnia.  Peripheral edema-she is on Lasix.  History of anemia  BMI 39.53, adult (HCC)-she had intentional weight loss of 50 pounds per patient.  Orders: Orders Placed This Encounter  Procedures   Large Joint Inj   No orders of the defined types were placed in this encounter.   Follow-Up Instructions: Return in about 6 months (around 03/22/2023) for Osteoarthritis.   Bo Merino, MD  Note - This record has been created using Editor, commissioning.  Chart creation errors have been sought, but may not always  have been located. Such creation errors do not reflect on  the standard of medical care.

## 2022-09-11 NOTE — Progress Notes (Deleted)
Cardiology Office Note:   Date:  09/11/2022  NAME:  Rebecca Brooks    MRN: [id number] DOB:  [birth year]   PCP:  Mosie Lukes, MD  Cardiologist:  Evalina Field, MD  Electrophysiologist:  None   Referring MD: Mosie Lukes, MD   No chief complaint on file. ***  History of Present Illness:   Rebecca Brooks is a 72 y.o. female with a hx of persistent atrial fibrillation, hypertension, obesity, OSA who presents for follow-up.  Problem List Persistent Atrial Fibrillation  -CHADSVASC=3 (age, HTN, female) -Dx 04/2022 -[facility] 2017 normal 2. HTN 3. Obesity 4. OSA  Past Medical History: Past Medical History:  Diagnosis Date   Abnormal nuclear stress test    False positive. Cath 08/23/15 showed Angiographically normal coronary arteries   Anemia    Ankle fracture    Arthritis    Asthma    "asthmatic bronchitis"   Back pain    Chicken pox as a child   Edema of both lower extremities    Essential hypertension 07/17/2011   ACEI d/c 06/2011 for refractory cough> resolved    GERD (gastroesophageal reflux disease)    Hyperlipidemia    Hypertension    Hypothyroidism    Insomnia 11/01/2014   Insulin resistance    pt reports she was told this in the past, does not take any medication for this   Joint pain    Low back pain radiating to right leg    intermittent and occasional numbness of skin over right hip in certain positions   Measles as a child   Mumps as a child   Obesity    Osteoarthritis    Palpitation    Pneumonia    this October 2013 from which she has said inhailer   PONV (postoperative nausea and vomiting)    "violent vomiting after surgery"   Recurrent epistaxis 02/20/2012   RLS (restless legs syndrome) 11/01/2014   Skin cancer    Sleep apnea    SOB (shortness of breath) on exertion    Spinal stenosis    Vitamin B 12 deficiency    Vitamin D deficiency     Past Surgical History: Past Surgical History:  Procedure Laterality Date   CARDIAC  CATHETERIZATION N/A 08/23/2015   Procedure: Left Heart Cath and Coronary Angiography;  Surgeon: [name], MD;  Location: [facility];  Service: Cardiovascular;  Laterality: N/A;   CESAREAN SECTION     X 3   GASTRECTOMY  12/2017   INGUINAL HERNIA REPAIR Right 72 yrs old   LUMBAR LAMINECTOMY/DECOMPRESSION MICRODISCECTOMY Bilateral 10/21/2021   Procedure: Laminectomy and Foraminotomy - bilateral - Lumbar Three-Lumbar Four - Lumbar Four- Lumbar Five;  Surgeon: Earnie Larsson, MD;  Location: [facility];  Service: Neurosurgery;  Laterality: Bilateral;   THYROIDECTOMY     total for benign tumor, Parathyroid spared   TONSILLECTOMY     TOTAL HIP ARTHROPLASTY Right 2006   secondary to congenital  hip defect    Current Medications: No outpatient medications have been marked as taking for the 09/15/22 encounter (Appointment) with O'Neal, Cassie Freer, MD.     Allergies:    Bee venom, Accupril [quinapril hcl], Quinapril-hydrochlorothiazide, and Rosuvastatin   Social History: Social History   Socioeconomic History   Marital status: Married    Spouse name: Not on file   Number of children: 3   Years of education: Not on file   Highest education level: Not on file  Occupational History  Occupation: Training and development officer   Occupation: Primary school teacher   Occupation: Retired Adult nurse  Tobacco Use   Smoking status: Former    Packs/day: 0.50    Years: 5.00    Total pack years: 2.50    Types: Cigarettes    Quit date: 07/25/1971    Years since quitting: 51.1    Passive exposure: Past   Smokeless tobacco: Never  Vaping Use   Vaping Use: Never used  Substance and Sexual Activity   Alcohol use: Yes    Alcohol/week: 7.0 standard drinks of alcohol    Types: 7 Glasses of wine per week   Drug use: No   Sexual activity: Never    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  Other Topics Concern   Not on file  Social History Narrative   Not on file   Social Determinants of Health   Financial  Resource Strain: Low Risk  (08/29/2021)   Overall Financial Resource Strain (CARDIA)    Difficulty of Paying Living Expenses: Not hard at all  Food Insecurity: No Food Insecurity (08/29/2021)   Hunger Vital Sign    Worried About Running Out of Food in the Last Year: Never true    [place] in the Last Year: Never true  Transportation Needs: No Transportation Needs (08/29/2021)   PRAPARE - Hydrologist (Medical): No    Lack of Transportation (Non-Medical): No  Physical Activity: Inactive (08/29/2021)   Exercise Vital Sign    Days of Exercise per Week: 0 days    Minutes of Exercise per Session: 0 min  Stress: No Stress Concern Present (08/29/2021)   [place]    Feeling of Stress : Not at all  Social Connections: [place] (08/29/2021)   Social Connection and Isolation Panel [NHANES]    Frequency of Communication with Friends and Family: Twice a week    Frequency of Social Gatherings with Friends and Family: More than three times a week    Attends Religious Services: More than 4 times per year    Active Member of [employer] or Organizations: Yes    Attends Music therapist: More than 4 times per year    Marital Status: Married     Family History: The patient's ***family history includes Aneurysm in her paternal grandfather; Anuerysm in her father; Anxiety disorder in her mother; Cancer in her mother; Colon cancer in her maternal grandmother; Hearing loss in her mother; Heart Problems in her mother and sister; Heart attack in her maternal grandfather; Heart disease in her paternal grandfather; Heart failure in her mother; Hyperlipidemia in her father and sister; Hypertension in her sister; Lung cancer (age of onset: 61) in her mother; Migraines in her sister; Obesity in her mother; Osteoarthritis in her father; Sudden death in her father.  ROS:   All other ROS reviewed and negative.  Pertinent positives noted in the HPI.     EKGs/Labs/Other Studies Reviewed:   The following studies were personally reviewed by me today:  EKG:  EKG is *** ordered today.  The ekg ordered today demonstrates ***, and was personally reviewed by me.   TTE 04/10/2022  1. Left ventricular ejection fraction, by estimation, is 60 to 65%. The  left ventricle has normal function. The left ventricle has no regional  wall motion abnormalities. Left ventricular diastolic parameters were  normal.   2. Right ventricular systolic function is normal. The right ventricular  size is  normal. There is normal pulmonary artery systolic pressure.   3. Left atrial size was mildly dilated.   4. The mitral valve is normal in structure. No evidence of mitral valve  regurgitation. No evidence of mitral stenosis.   5. The aortic valve is tricuspid. There is mild calcification of the  aortic valve. Aortic valve regurgitation is not visualized. Aortic valve  sclerosis is present, with no evidence of aortic valve stenosis.   6. The inferior vena cava is normal in size with greater than 50%  respiratory variability, suggesting right atrial pressure of 3 mmHg.   Recent Labs: 03/28/2022: ALT 18; TSH 0.89 05/04/2022: BUN 24; Creatinine, Ser 0.89; Hemoglobin 14.5; Platelets 407; Potassium 3.9; Sodium 139   Recent Lipid Panel    Component Value Date/Time   CHOL 187 03/28/2022 1707   CHOL 200 (H) 01/26/2021 0934   TRIG 128.0 03/28/2022 1707   HDL 71.40 03/28/2022 1707   HDL 83 01/26/2021 0934   CHOLHDL 3 03/28/2022 1707   VLDL 25.6 03/28/2022 1707   LDLCALC 90 03/28/2022 1707   LDLCALC 105 (H) 01/26/2021 0934    Physical Exam:   VS:  There were no vitals taken for this visit.   Wt Readings from Last 3 Encounters:  06/27/22 241 lb (109.3 kg)  06/26/22 240 lb 9.6 oz (109.1 kg)  05/15/22 254 lb (115.2 kg)    General: Well nourished, well developed, in no acute distress Head: Atraumatic, normal size  Eyes:  PEERLA, EOMI  Neck: Supple, no JVD Endocrine: No thryomegaly Cardiac: Normal S1, S2; RRR; no murmurs, rubs, or gallops Lungs: Clear to auscultation bilaterally, no wheezing, rhonchi or rales  Abd: Soft, nontender, no hepatomegaly  Ext: No edema, pulses 2+ Musculoskeletal: No deformities, BUE and BLE strength normal and equal Skin: Warm and dry, no rashes   Neuro: Alert and oriented to person, place, time, and situation, CNII-XII grossly intact, no focal deficits  Psych: Normal mood and affect   ASSESSMENT:   Nkauj Masker is a 72 y.o. female who presents for the following: No diagnosis found.  PLAN:   There are no diagnoses linked to this encounter.  {Are you ordering a CV Procedure (e.g. stress test, cath, DCCV, TEE, etc)?   Press F2        :[id number]  Disposition: No follow-ups on file.  Medication Adjustments/Labs and Tests Ordered: Current medicines are reviewed at length with the patient today.  Concerns regarding medicines are outlined above.  No orders of the defined types were placed in this encounter.  No orders of the defined types were placed in this encounter.   There are no Patient Instructions on file for this visit.   Time Spent with Patient: I have spent a total of *** minutes with patient reviewing hospital notes, telemetry, EKGs, labs and examining the patient as well as establishing an assessment and plan that was discussed with the patient.  > 50% of time was spent in direct patient care.  Signed, Addison Naegeli. Audie Box, MD, [facility]  [address] [place], [place] [address] [phone]  09/11/2022 10:27 AM

## 2022-09-13 ENCOUNTER — Ambulatory Visit (INDEPENDENT_AMBULATORY_CARE_PROVIDER_SITE_OTHER): Payer: Medicare Other | Admitting: *Deleted

## 2022-09-13 VITALS — Ht 62.5 in | Wt 219.0 lb

## 2022-09-13 DIAGNOSIS — Z0000 Encounter for general adult medical examination without abnormal findings: Secondary | ICD-10-CM

## 2022-09-13 NOTE — Patient Instructions (Signed)
Rebecca Brooks , Thank you for taking time to come for your Medicare Wellness Visit. I appreciate your ongoing commitment to your health goals. Please review the following plan we discussed and let me know if I can assist you in the future.    This is a list of the screening recommended for you and due dates:  Health Maintenance  Topic Date Due   Hepatitis C Screening: USPSTF Recommendation to screen - Ages 11-73 yo.  Never done   Zoster (Shingles) Vaccine (1 of 2) Never done   DTaP/Tdap/Td vaccine (3 - Td or Tdap) 07/31/2021   COVID-19 Vaccine (4 - 2023-24 season) 03/24/2022   Mammogram  04/20/2022   Cologuard (Stool DNA test)  03/09/2023   Pneumonia Vaccine (3 of 3 - PPSV23 or PCV20) 05/15/2023   Medicare Annual Wellness Visit  09/14/2023   Flu Shot  Completed   DEXA scan (bone density measurement)  Completed   HPV Vaccine  Aged Out   Colon Cancer Screening  Discontinued     Next appointment: Follow up in one year for your annual wellness visit.   Preventive Care 73 Years and Older, Female Preventive care refers to lifestyle choices and visits with your health care provider that can promote health and wellness. What does preventive care include? A yearly physical exam. This is also called an annual well check. Dental exams once or twice a year. Routine eye exams. Ask your health care provider how often you should have your eyes checked. Personal lifestyle choices, including: Daily care of your teeth and gums. Regular physical activity. Eating a healthy diet. Avoiding tobacco and drug use. Limiting alcohol use. Practicing safe sex. Taking low-dose aspirin every day. Taking vitamin and mineral supplements as recommended by your health care provider. What happens during an annual well check? The services and screenings done by your health care provider during your annual well check will depend on your age, overall health, lifestyle risk factors, and family history of  disease. Counseling  Your health care provider may ask you questions about your: Alcohol use. Tobacco use. Drug use. Emotional well-being. Home and relationship well-being. Sexual activity. Eating habits. History of falls. Memory and ability to understand (cognition). Work and work Statistician. Reproductive health. Screening  You may have the following tests or measurements: Height, weight, and BMI. Blood pressure. Lipid and cholesterol levels. These may be checked every 5 years, or more frequently if you are over 68 years old. Skin check. Lung cancer screening. You may have this screening every year starting at age 73 if you have a 30-pack-year history of smoking and currently smoke or have quit within the past 15 years. Fecal occult blood test (FOBT) of the stool. You may have this test every year starting at age 73. Flexible sigmoidoscopy or colonoscopy. You may have a sigmoidoscopy every 5 years or a colonoscopy every 10 years starting at age 73. Hepatitis C blood test. Hepatitis B blood test. Sexually transmitted disease (STD) testing. Diabetes screening. This is done by checking your blood sugar (glucose) after you have not eaten for a while (fasting). You may have this done every 1-3 years. Bone density scan. This is done to screen for osteoporosis. You may have this done starting at age 73. Mammogram. This may be done every 1-2 years. Talk to your health care provider about how often you should have regular mammograms. Talk with your health care provider about your test results, treatment options, and if necessary, the need for more tests. Vaccines  Your health care provider may recommend certain vaccines, such as: Influenza vaccine. This is recommended every year. Tetanus, diphtheria, and acellular pertussis (Tdap, Td) vaccine. You may need a Td booster every 10 years. Zoster vaccine. You may need this after age 16. Pneumococcal 13-valent conjugate (PCV13) vaccine. One  dose is recommended after age 73. Pneumococcal polysaccharide (PPSV23) vaccine. One dose is recommended after age 73. Talk to your health care provider about which screenings and vaccines you need and how often you need them. This information is not intended to replace advice given to you by your health care provider. Make sure you discuss any questions you have with your health care provider. Document Released: 08/06/2015 Document Revised: 03/29/2016 Document Reviewed: 05/11/2015 Elsevier Interactive Patient Education  2017 [employer] Prevention in the Home Falls can cause injuries. They can happen to people of all ages. There are many things you can do to make your home safe and to help prevent falls. What can I do on the outside of my home? Regularly fix the edges of walkways and driveways and fix any cracks. Remove anything that might make you trip as you walk through a door, such as a raised step or threshold. Trim any bushes or trees on the path to your home. Use bright outdoor lighting. Clear any walking paths of anything that might make someone trip, such as rocks or tools. Regularly check to see if handrails are loose or broken. Make sure that both sides of any steps have handrails. Any raised decks and porches should have guardrails on the edges. Have any leaves, snow, or ice cleared regularly. Use sand or salt on walking paths during winter. Clean up any spills in your garage right away. This includes oil or grease spills. What can I do in the bathroom? Use night lights. Install grab bars by the toilet and in the tub and shower. Do not use towel bars as grab bars. Use non-skid mats or decals in the tub or shower. If you need to sit down in the shower, use a plastic, non-slip stool. Keep the floor dry. Clean up any water that spills on the floor as soon as it happens. Remove soap buildup in the tub or shower regularly. Attach bath mats securely with double-sided  non-slip rug tape. Do not have throw rugs and other things on the floor that can make you trip. What can I do in the bedroom? Use night lights. Make sure that you have a light by your bed that is easy to reach. Do not use any sheets or blankets that are too big for your bed. They should not hang down onto the floor. Have a firm chair that has side arms. You can use this for support while you get dressed. Do not have throw rugs and other things on the floor that can make you trip. What can I do in the kitchen? Clean up any spills right away. Avoid walking on wet floors. Keep items that you use a lot in easy-to-reach places. If you need to reach something above you, use a strong step stool that has a grab bar. Keep electrical cords out of the way. Do not use floor polish or wax that makes floors slippery. If you must use wax, use non-skid floor wax. Do not have throw rugs and other things on the floor that can make you trip. What can I do with my stairs? Do not leave any items on the stairs. Make sure that there are  handrails on both sides of the stairs and use them. Fix handrails that are broken or loose. Make sure that handrails are as long as the stairways. Check any carpeting to make sure that it is firmly attached to the stairs. Fix any carpet that is loose or worn. Avoid having throw rugs at the top or bottom of the stairs. If you do have throw rugs, attach them to the floor with carpet tape. Make sure that you have a light switch at the top of the stairs and the bottom of the stairs. If you do not have them, ask someone to add them for you. What else can I do to help prevent falls? Wear shoes that: Do not have high heels. Have rubber bottoms. Are comfortable and fit you well. Are closed at the toe. Do not wear sandals. If you use a stepladder: Make sure that it is fully opened. Do not climb a closed stepladder. Make sure that both sides of the stepladder are locked into place. Ask  someone to hold it for you, if possible. Clearly mark and make sure that you can see: Any grab bars or handrails. First and last steps. Where the edge of each step is. Use tools that help you move around (mobility aids) if they are needed. These include: Canes. Walkers. Scooters. Crutches. Turn on the lights when you go into a dark area. Replace any light bulbs as soon as they burn out. Set up your furniture so you have a clear path. Avoid moving your furniture around. If any of your floors are uneven, fix them. If there are any pets around you, be aware of where they are. Review your medicines with your doctor. Some medicines can make you feel dizzy. This can increase your chance of falling. Ask your doctor what other things that you can do to help prevent falls. This information is not intended to replace advice given to you by your health care provider. Make sure you discuss any questions you have with your health care provider. Document Released: 05/06/2009 Document Revised: 12/16/2015 Document Reviewed: 08/14/2014 Elsevier Interactive Patient Education  2017 [employer].

## 2022-09-13 NOTE — Progress Notes (Signed)
Subjective:   Rebecca Brooks is a 72 y.o. female who presents for Medicare Annual (Subsequent) preventive examination.  I connected with  Foye Clock on 09/13/22 by a audio enabled telemedicine application and verified that I am speaking with the correct person using two identifiers.  Patient Location: Home  Provider Location: Office/Clinic  I discussed the limitations of evaluation and management by telemedicine. The patient expressed understanding and agreed to proceed.   Review of Systems    Defer to PCP Cardiac Risk Factors include: advanced age (>54mn, >>71women);dyslipidemia;hypertension;obesity (BMI >30kg/m2)     Objective:    Today's Vitals   09/13/22 1044  Weight: 219 lb (99.3 kg)  Height: 5' 2.5" (1.588 m)   Body mass index is 39.42 kg/m.     09/13/2022   10:21 AM 05/04/2022    5:32 PM 05/03/2022    8:06 AM 03/10/2022    9:52 PM 10/21/2021   12:31 PM 08/29/2021   10:30 AM 09/09/2020   12:24 AM  Advanced Directives  Does Patient Have a Medical Advance Directive? Yes No Yes No Yes Yes No  Type of AParamedicof [place]ontrose ManorLiving will  [place]aLiving will  [place]Living will [place] will   Does patient want to make changes to medical advance directive? No - Patient declined        Copy of [place] Chart? No - copy requested     No - copy requested   Would patient like information on creating a medical advance directive?    No - Patient declined       Current Medications (verified) Outpatient Encounter Medications as of 09/13/2022  Medication Sig   acetaminophen (TYLENOL) 500 MG tablet Take 1,000 mg by mouth in the morning and at bedtime.   albuterol (VENTOLIN HFA) 108 (90 Base) MCG/ACT inhaler Inhale 1-2 puffs into the lungs every 6 (six) hours as needed for wheezing or shortness of breath.   apixaban (ELIQUIS) 5 MG TABS tablet Take 1  tablet (5 mg total) by mouth 2 (two) times daily.   benzonatate (TESSALON) 200 MG capsule Take 1 capsule (200 mg total) by mouth 3 (three) times daily as needed for cough.   cetirizine (ZYRTEC) 10 MG tablet TAKE 1 TABLET(10 MG) BY MOUTH DAILY   cyclobenzaprine (FLEXERIL) 10 MG tablet Take 1 tablet (10 mg total) by mouth 3 (three) times daily as needed for muscle spasms.   diclofenac Sodium (VOLTAREN) 1 % GEL Apply 2-4 g topically 4 (four) times daily as needed. (Patient taking differently: Apply 2-4 g topically 4 (four) times daily as needed (for arthritis).)   diltiazem (CARDIZEM CD) 240 MG 24 hr capsule Take 1 capsule (240 mg total) by mouth daily.   famotidine (PEPCID) 20 MG tablet TAKE 1 TABLET(20 MG) BY MOUTH AT BEDTIME (Patient taking differently: Take 20 mg by mouth at bedtime.)   fluticasone (FLONASE) 50 MCG/ACT nasal spray Place 2 sprays into both nostrils at bedtime.   furosemide (LASIX) 20 MG tablet Take 1 tablet (20 mg total) by mouth daily as needed for fluid or edema (weight gain > 3#/24 hr).   levothyroxine (SYNTHROID) 100 MCG tablet Take 1 tablet (100 mcg total) by mouth daily before breakfast.   metoprolol tartrate (LOPRESSOR) 50 MG tablet TAKE 1 TABLET(50 MG) BY MOUTH TWICE DAILY   pantoprazole (PROTONIX) 40 MG tablet TAKE 1 TABLET(40 MG) BY MOUTH DAILY   rOPINIRole (REQUIP) 1  MG tablet TAKE 1 TABLET(1 MG) BY MOUTH AT BEDTIME   simvastatin (ZOCOR) 40 MG tablet TAKE 1 TABLET(40 MG) BY MOUTH AT BEDTIME   traMADol (ULTRAM) 50 MG tablet Take 1 tablet (50 mg total) by mouth 3 (three) times daily as needed for moderate pain.   Vitamin D3 (VITAMIN D) 25 MCG tablet Take 1 tablet (1,000 Units total) by mouth daily.   zolpidem (AMBIEN) 10 MG tablet Take 1 tablet (10 mg total) by mouth at bedtime as needed for sleep.   [DISCONTINUED] predniSONE (DELTASONE) 10 MG tablet 4 tabs by mouth once daily for 2 days, then 3 tabs daily x 2 days, then 2 tabs daily x 2 days, then 1 tab daily x 2 days    No facility-administered encounter medications on file as of 09/13/2022.    Allergies (verified) Bee venom, Accupril [quinapril hcl], Quinapril-hydrochlorothiazide, and Rosuvastatin   History: Past Medical History:  Diagnosis Date   Abnormal nuclear stress test    False positive. Cath 08/23/15 showed Angiographically normal coronary arteries   Anemia    Ankle fracture    Arthritis    Asthma    "asthmatic bronchitis"   Back pain    Chicken pox as a child   Edema of both lower extremities    Essential hypertension 07/17/2011   ACEI d/c 06/2011 for refractory cough> resolved    GERD (gastroesophageal reflux disease)    Hyperlipidemia    Hypertension    Hypothyroidism    Insomnia 11/01/2014   Insulin resistance    pt reports she was told this in the past, does not take any medication for this   Joint pain    Low back pain radiating to right leg    intermittent and occasional numbness of skin over right hip in certain positions   Measles as a child   Mumps as a child   Obesity    Osteoarthritis    Palpitation    Pneumonia    this October 2013 from which she has said inhailer   PONV (postoperative nausea and vomiting)    "violent vomiting after surgery"   Recurrent epistaxis 02/20/2012   RLS (restless legs syndrome) 11/01/2014   Skin cancer    Sleep apnea    SOB (shortness of breath) on exertion    Spinal stenosis    Vitamin B 12 deficiency    Vitamin D deficiency    Past Surgical History:  Procedure Laterality Date   CARDIAC CATHETERIZATION N/A 08/23/2015   Procedure: Left Heart Cath and Coronary Angiography;  Surgeon: [name], MD;  Location: [facility];  Service: Cardiovascular;  Laterality: N/A;   CESAREAN SECTION     X 3   GASTRECTOMY  12/2017   INGUINAL HERNIA REPAIR Right 72 yrs old   LUMBAR LAMINECTOMY/DECOMPRESSION MICRODISCECTOMY Bilateral 10/21/2021   Procedure: Laminectomy and Foraminotomy - bilateral - Lumbar Three-Lumbar Four - Lumbar  Four- Lumbar Five;  Surgeon: Earnie Larsson, MD;  Location: [facility];  Service: Neurosurgery;  Laterality: Bilateral;   THYROIDECTOMY     total for benign tumor, Parathyroid spared   TONSILLECTOMY     TOTAL HIP ARTHROPLASTY Right 2006   secondary to congenital  hip defect   Family History  Problem Relation Age of Onset   Cancer Mother    Heart failure Mother    Lung cancer Mother 35       smoker   Heart Problems Mother        tachycardia   Hearing loss  Mother    Anxiety disorder Mother        anxiety, claustrophobia   Obesity Mother    Osteoarthritis Father    Hyperlipidemia Father    Anuerysm Father        AAA   Sudden death Father    Hyperlipidemia Sister    Migraines Sister    Hypertension Sister    Heart Problems Sister        tachycardia   Colon cancer Maternal Grandmother    Heart attack Maternal Grandfather    Aneurysm Paternal Grandfather        abdominal   Heart disease Paternal Grandfather        AAA rupture, smoker   Social History   Socioeconomic History   Marital status: Married    Spouse name: Not on file   Number of children: 3   Years of education: Not on file   Highest education level: Not on file  Occupational History   Occupation: Training and development officer   Occupation: Primary school teacher   Occupation: Retired Adult nurse  Tobacco Use   Smoking status: Former    Packs/day: 0.50    Years: 5.00    Total pack years: 2.50    Types: Cigarettes    Quit date: 07/25/1971    Years since quitting: 51.1    Passive exposure: Past   Smokeless tobacco: Never  Vaping Use   Vaping Use: Never used  Substance and Sexual Activity   Alcohol use: Yes    Alcohol/week: 7.0 standard drinks of alcohol    Types: 7 Glasses of wine per week   Drug use: No   Sexual activity: Never    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  Other Topics Concern   Not on file  Social History Narrative   Not on file   Social Determinants of Health   Financial Resource Strain: Low Risk   (09/13/2022)   Overall Financial Resource Strain (CARDIA)    Difficulty of Paying Living Expenses: Not very hard  Food Insecurity: No Food Insecurity (09/13/2022)   Hunger Vital Sign    Worried About Running Out of Food in the Last Year: Never true    Ran Out of Food in the Last Year: Never true  Transportation Needs: No Transportation Needs (09/13/2022)   PRAPARE - Hydrologist (Medical): No    Lack of Transportation (Non-Medical): No  Physical Activity: Inactive (09/13/2022)   Exercise Vital Sign    Days of Exercise per Week: 0 days    Minutes of Exercise per Session: 0 min  Stress: No Stress Concern Present (09/13/2022)   [place]    Feeling of Stress : Only a little  Social Connections: Socially Integrated (09/13/2022)   Social Connection and Isolation Panel [NHANES]    Frequency of Communication with Friends and Family: Twice a week    Frequency of Social Gatherings with Friends and Family: Once a week    Attends Religious Services: More than 4 times per year    Active Member of [employer] or Organizations: Yes    Attends Music therapist: More than 4 times per year    Marital Status: Married    Tobacco Counseling Counseling given: Not Answered   Clinical Intake:  Pre-visit preparation completed: Yes  Pain : No/denies pain  Nutritional Risks: None Diabetes: Yes CBG done?: No Did pt. bring in CBG monitor from home?: No  How often do you need  to have someone help you when you read instructions, pamphlets, or other written materials from your doctor or pharmacy?: 1 - Never   Activities of Daily Living    09/13/2022    9:19 AM 10/21/2021   12:30 PM  In your present state of health, do you have any difficulty performing the following activities:  Hearing? 0   Vision? 1   Comment has cataract in right eye   Difficulty concentrating or making decisions? 0   Walking or  climbing stairs? 1   Dressing or bathing? 0   Doing errands, shopping? 0 0  Preparing Food and eating ? N   Using the Toilet? N   In the past six months, have you accidently leaked urine? Y   Do you have problems with loss of bowel control? N   Managing your Medications? N   Managing your Finances? N   Housekeeping or managing your Housekeeping? Y     Patient Care Team: Mosie Lukes, MD as PCP - General (Family Medicine) O'Neal, Cassie Freer, MD as PCP - Cardiology (Cardiology)  Indicate any recent Medical Services you may have received from other than [facility] providers in the past year (date may be approximate).     Assessment:   This is a routine wellness examination for Zeana.  Hearing/Vision screen No results found.  Dietary issues and exercise activities discussed: Current Exercise Habits: The patient does not participate in regular exercise at present, Exercise limited by: orthopedic condition(s)   Goals Addressed   None    Depression Screen    09/13/2022   10:26 AM 03/28/2022    4:01 PM 08/29/2021   10:37 AM 01/26/2021    8:42 AM 02/16/2020   11:31 AM 07/06/2015    5:15 PM 06/12/2013   10:25 AM  PHQ 2/9 Scores  PHQ - 2 Score 0 0 0 3 0 0 0  PHQ- 9 Score  0  10 0      Fall Risk    09/13/2022    9:19 AM 03/28/2022    4:01 PM 08/29/2021   10:32 AM 02/28/2021    3:33 PM 02/16/2020   10:28 AM  Fall Risk   Falls in the past year? 1 1 1 $ 0 1  Number falls in past yr: 0 0 0 0 0  Injury with Fall? 0 0 0 0 0  Risk for fall due to : History of fall(s)  History of fall(s)    Follow up Falls evaluation completed  Falls prevention discussed      FALL RISK PREVENTION PERTAINING TO THE HOME:  Any stairs in or around the home? No  Home free of loose throw rugs in walkways, pet beds, electrical cords, etc? Yes  Adequate lighting in your home to reduce risk of falls? Yes   ASSISTIVE DEVICES UTILIZED TO PREVENT FALLS:  Life alert?  Wears Apple watch Use of a cane, walker or  w/c? Yes  Grab bars in the bathroom? Yes  Shower chair or bench in shower? No  Elevated toilet seat or a handicapped toilet?  Comfort height  TIMED UP AND GO:  Was the test performed?  No, audio visit .    Cognitive Function:        09/13/2022   10:32 AM  6CIT Screen  What Year? 0 points  What month? 0 points  What time? 0 points  Count back from 20 0 points  Months in reverse 0 points  Repeat phrase 0 points  Total  Score 0 points    Immunizations Immunization History  Administered Date(s) Administered   Fluad Quad(high Dose 65+) 04/19/2019, 06/27/2022   Influenza Split 07/19/2012   Influenza Whole 06/24/2011, 05/13/2013   Influenza, High Dose Seasonal PF 06/22/2017, 05/14/2018   Influenza,inj,Quad PF,6+ Mos 06/03/2016   Influenza-Unspecified 05/07/2014, 06/08/2015   PFIZER(Purple Top)SARS-COV-2 Vaccination 08/12/2019, 09/03/2019, 06/05/2020   Pneumococcal Conjugate-13 05/14/2018   Pneumococcal Polysaccharide-23 07/18/2011   Td 07/24/2005   Tdap 08/01/2011   Zoster, Live 07/23/2012    TDAP status: Due, Education has been provided regarding the importance of this vaccine. Advised may receive this vaccine at local pharmacy or Health Dept. Aware to provide a copy of the vaccination record if obtained from local pharmacy or Health Dept. Verbalized acceptance and understanding.  Flu Vaccine status: Up to date  Pneumococcal vaccine status: Up to date  Covid-19 vaccine status: Information provided on how to obtain vaccines.   Qualifies for Shingles Vaccine? Yes   Zostavax completed Yes   Shingrix Completed?: No.    Education has been provided regarding the importance of this vaccine. Patient has been advised to call insurance company to determine out of pocket expense if they have not yet received this vaccine. Advised may also receive vaccine at local pharmacy or Health Dept. Verbalized acceptance and understanding.  Screening Tests Health Maintenance  Topic Date Due    Hepatitis C Screening  Never done   Zoster Vaccines- Shingrix (1 of 2) Never done   DTaP/Tdap/Td (3 - Td or Tdap) 07/31/2021   COVID-19 Vaccine (4 - 2023-24 season) 03/24/2022   MAMMOGRAM  04/20/2022   Fecal DNA (Cologuard)  03/09/2023   Pneumonia Vaccine 25+ Years old (3 of 3 - PPSV23 or PCV20) 05/15/2023   Medicare Annual Wellness (AWV)  09/14/2023   INFLUENZA VACCINE  Completed   DEXA SCAN  Completed   HPV VACCINES  Aged Out   COLONOSCOPY (Pts 45-2yr Insurance coverage will need to be confirmed)  Discontinued    Health Maintenance  Health Maintenance Due  Topic Date Due   Hepatitis C Screening  Never done   Zoster Vaccines- Shingrix (1 of 2) Never done   DTaP/Tdap/Td (3 - Td or Tdap) 07/31/2021   COVID-19 Vaccine (4 - 2023-24 season) 03/24/2022   MAMMOGRAM  04/20/2022    Colorectal cancer screening: Type of screening: Cologuard. Completed 03/08/20. Repeat every 3 years  Mammogram status: Completed 04/20/20. Repeat every year scheduled for 09/19/22  Bone Density status: scheduled for 09/19/22  Lung Cancer Screening: (Low Dose CT Chest recommended if Age 73-80years, 30 pack-year currently smoking OR have quit w/in 15years.) does not qualify.    Additional Screening:  Hepatitis C Screening: does qualify; Completed N/a  Vision Screening: Recommended annual ophthalmology exams for early detection of glaucoma and other disorders of the eye. Is the patient up to date with their annual eye exam?  Yes  Who is the provider or what is the name of the office in which the patient attends annual eye exams? Dr. MStefan ChurchIf pt is not established with a provider, would they like to be referred to a provider to establish care? No .   Dental Screening: Recommended annual dental exams for proper oral hygiene  Community Resource Referral / Chronic Care Management: CRR required this visit?  No   CCM required this visit?  No      Plan:     I have personally reviewed and  noted the following in the patient's chart:   Medical  and social history Use of alcohol, tobacco or illicit drugs  Current medications and supplements including opioid prescriptions. Patient is currently taking opioid prescriptions. Information provided to patient regarding non-opioid alternatives. Patient advised to discuss non-opioid treatment plan with their provider. Functional ability and status Nutritional status Physical activity Advanced directives List of other physicians Hospitalizations, surgeries, and ER visits in previous 12 months Vitals Screenings to include cognitive, depression, and falls Referrals and appointments  In addition, I have reviewed and discussed with patient certain preventive protocols, quality metrics, and best practice recommendations. A written personalized care plan for preventive services as well as general preventive health recommendations were provided to patient.   Due to this being a telephonic visit, the after visit summary with patients personalized plan was offered to patient via mail or my-chart. Patient would like to access on my-chart.  Beatris Ship, [place]   09/13/2022   Nurse Notes: None

## 2022-09-15 ENCOUNTER — Ambulatory Visit: Payer: Medicare Other | Admitting: Cardiovascular Disease

## 2022-09-15 DIAGNOSIS — I4819 Other persistent atrial fibrillation: Secondary | ICD-10-CM

## 2022-09-15 DIAGNOSIS — I10 Essential (primary) hypertension: Secondary | ICD-10-CM

## 2022-09-19 ENCOUNTER — Ambulatory Visit
Admission: RE | Admit: 2022-09-19 | Discharge: 2022-09-19 | Disposition: A | Discharge status: Home / Self Care | Payer: Medicare Other | Source: Ambulatory Visit | Attending: Family Medicine | Admitting: Family Medicine

## 2022-09-19 DIAGNOSIS — Z1231 Encounter for screening mammogram for malignant neoplasm of breast: Secondary | ICD-10-CM

## 2022-09-19 DIAGNOSIS — M8589 Other specified disorders of bone density and structure, multiple sites: Secondary | ICD-10-CM | POA: Diagnosis not present

## 2022-09-19 DIAGNOSIS — Z780 Asymptomatic menopausal state: Secondary | ICD-10-CM

## 2022-09-19 DIAGNOSIS — E2839 Other primary ovarian failure: Secondary | ICD-10-CM

## 2022-09-21 ENCOUNTER — Ambulatory Visit: Payer: Medicare Other | Attending: Rheumatology | Admitting: Rheumatology

## 2022-09-21 ENCOUNTER — Encounter: Payer: Self-pay | Admitting: Rheumatology

## 2022-09-21 ENCOUNTER — Encounter: Payer: Self-pay | Admitting: Family Medicine

## 2022-09-21 VITALS — BP 131/75 | HR 90 | Resp 16 | Ht 62.5 in | Wt 219.6 lb

## 2022-09-21 DIAGNOSIS — M19011 Primary osteoarthritis, right shoulder: Secondary | ICD-10-CM

## 2022-09-21 DIAGNOSIS — S92125S Nondisplaced fracture of body of left talus, sequela: Secondary | ICD-10-CM

## 2022-09-21 DIAGNOSIS — M19042 Primary osteoarthritis, left hand: Secondary | ICD-10-CM

## 2022-09-21 DIAGNOSIS — M19012 Primary osteoarthritis, left shoulder: Secondary | ICD-10-CM

## 2022-09-21 DIAGNOSIS — Z96641 Presence of right artificial hip joint: Secondary | ICD-10-CM | POA: Diagnosis not present

## 2022-09-21 DIAGNOSIS — M170 Bilateral primary osteoarthritis of knee: Secondary | ICD-10-CM

## 2022-09-21 DIAGNOSIS — M51369 Other intervertebral disc degeneration, lumbar region without mention of lumbar back pain or lower extremity pain: Secondary | ICD-10-CM

## 2022-09-21 DIAGNOSIS — R609 Edema, unspecified: Secondary | ICD-10-CM

## 2022-09-21 DIAGNOSIS — Z6839 Body mass index (BMI) 39.0-39.9, adult: Secondary | ICD-10-CM

## 2022-09-21 DIAGNOSIS — Z8679 Personal history of other diseases of the circulatory system: Secondary | ICD-10-CM

## 2022-09-21 DIAGNOSIS — F5101 Primary insomnia: Secondary | ICD-10-CM

## 2022-09-21 DIAGNOSIS — Z8639 Personal history of other endocrine, nutritional and metabolic disease: Secondary | ICD-10-CM

## 2022-09-21 DIAGNOSIS — M8589 Other specified disorders of bone density and structure, multiple sites: Secondary | ICD-10-CM

## 2022-09-21 DIAGNOSIS — M19041 Primary osteoarthritis, right hand: Secondary | ICD-10-CM | POA: Diagnosis not present

## 2022-09-21 DIAGNOSIS — R600 Localized edema: Secondary | ICD-10-CM

## 2022-09-21 DIAGNOSIS — Z862 Personal history of diseases of the blood and blood-forming organs and certain disorders involving the immune mechanism: Secondary | ICD-10-CM

## 2022-09-21 DIAGNOSIS — J4489 Other specified chronic obstructive pulmonary disease: Secondary | ICD-10-CM

## 2022-09-21 DIAGNOSIS — M5136 Other intervertebral disc degeneration, lumbar region: Secondary | ICD-10-CM

## 2022-09-21 DIAGNOSIS — Z6841 Body Mass Index (BMI) 40.0 and over, adult: Secondary | ICD-10-CM

## 2022-09-21 DIAGNOSIS — M5030 Other cervical disc degeneration, unspecified cervical region: Secondary | ICD-10-CM

## 2022-09-21 MED ORDER — TRIAMCINOLONE ACETONIDE 40 MG/ML IJ SUSP
40.0000 mg | INTRAMUSCULAR | Status: AC | PRN
  Administered 2022-09-21: 40 mg via INTRA_ARTICULAR

## 2022-09-21 MED ORDER — LIDOCAINE HCL 1 % IJ SOLN
1.5000 mL | INTRAMUSCULAR | Status: AC | PRN
  Administered 2022-09-21: 1.5 mL

## 2022-09-21 NOTE — Patient Instructions (Signed)
Hand Exercises Hand exercises can be helpful for almost anyone. These exercises can strengthen the hands, improve flexibility and movement, and increase blood flow to the hands. These results can make work and daily tasks easier. Hand exercises can be especially helpful for people who have joint pain from arthritis or have nerve damage from overuse (carpal tunnel syndrome). These exercises can also help people who have injured a hand. Exercises Most of these hand exercises are gentle stretching and motion exercises. It is usually safe to do them often throughout the day. Warming up your hands before exercise may help to reduce stiffness. You can do this with gentle massage or by placing your hands in warm water for 10-15 minutes. It is normal to feel some stretching, pulling, tightness, or mild discomfort as you begin new exercises. This will gradually improve. Stop an exercise right away if you feel sudden, severe pain or your pain gets worse. Ask your health care provider which exercises are best for you. Knuckle bend or "claw" fist  Stand or sit with your arm, hand, and all five fingers pointed straight up. Make sure to keep your wrist straight during the exercise. Gently bend your fingers down toward your palm until the tips of your fingers are touching the top of your palm. Keep your big knuckle straight and just bend the small knuckles in your fingers. Hold this position for __________ seconds. Straighten (extend) your fingers back to the starting position. Repeat this exercise 5-10 times with each hand. Full finger fist  Stand or sit with your arm, hand, and all five fingers pointed straight up. Make sure to keep your wrist straight during the exercise. Gently bend your fingers into your palm until the tips of your fingers are touching the middle of your palm. Hold this position for __________ seconds. Extend your fingers back to the starting position, stretching every joint fully. Repeat  this exercise 5-10 times with each hand. Straight fist Stand or sit with your arm, hand, and all five fingers pointed straight up. Make sure to keep your wrist straight during the exercise. Gently bend your fingers at the big knuckle, where your fingers meet your hand, and the middle knuckle. Keep the knuckle at the tips of your fingers straight and try to touch the bottom of your palm. Hold this position for __________ seconds. Extend your fingers back to the starting position, stretching every joint fully. Repeat this exercise 5-10 times with each hand. Tabletop  Stand or sit with your arm, hand, and all five fingers pointed straight up. Make sure to keep your wrist straight during the exercise. Gently bend your fingers at the big knuckle, where your fingers meet your hand, as far down as you can while keeping the small knuckles in your fingers straight. Think of forming a tabletop with your fingers. Hold this position for __________ seconds. Extend your fingers back to the starting position, stretching every joint fully. Repeat this exercise 5-10 times with each hand. Finger spread  Place your hand flat on a table with your palm facing down. Make sure your wrist stays straight as you do this exercise. Spread your fingers and thumb apart from each other as far as you can until you feel a gentle stretch. Hold this position for __________ seconds. Bring your fingers and thumb tight together again. Hold this position for __________ seconds. Repeat this exercise 5-10 times with each hand. Making circles  Stand or sit with your arm, hand, and all five fingers pointed   straight up. Make sure to keep your wrist straight during the exercise. Make a circle by touching the tip of your thumb to the tip of your index finger. Hold for __________ seconds. Then open your hand wide. Repeat this motion with your thumb and each finger on your hand. Repeat this exercise 5-10 times with each hand. Thumb  motion  Sit with your forearm resting on a table and your wrist straight. Your thumb should be facing up toward the ceiling. Keep your fingers relaxed as you move your thumb. Lift your thumb up as high as you can toward the ceiling. Hold for __________ seconds. Bend your thumb across your palm as far as you can, reaching the tip of your thumb for the small finger (pinkie) side of your palm. Hold for __________ seconds. Repeat this exercise 5-10 times with each hand. Grip strengthening  Hold a stress ball or other soft ball in the middle of your hand. Slowly increase the pressure, squeezing the ball as much as you can without causing pain. Think of bringing the tips of your fingers into the middle of your palm. All of your finger joints should bend when doing this exercise. Hold your squeeze for __________ seconds, then relax. Repeat this exercise 5-10 times with each hand. Contact a health care provider if: Your hand pain or discomfort gets much worse when you do an exercise. Your hand pain or discomfort does not improve within 2 hours after you exercise. If you have any of these problems, stop doing these exercises right away. Do not do them again unless your health care provider says that you can. Get help right away if: You develop sudden, severe hand pain or swelling. If this happens, stop doing these exercises right away. Do not do them again unless your health care provider says that you can. This information is not intended to replace advice given to you by your health care provider. Make sure you discuss any questions you have with your health care provider. Document Revised: 10/21/2020 Document Reviewed: 10/28/2020 Elsevier Patient Education  [place].  Exercises for Chronic Knee Pain Chronic knee pain is pain that lasts longer than 3 months. For most people with chronic knee pain, exercise and weight loss is an important part of treatment. Your health care provider may want  you to focus on: Strengthening the muscles that support your knee. This can take pressure off your knee and lessen pain. Preventing knee stiffness. Maintaining or increasing how far you can move your knee. Losing weight (if this applies) to take pressure off your knee, decrease your risk for injury, and make it easier for you to exercise. Your health care provider will help you develop an exercise program that matches your needs and physical abilities. Below are simple, low-impact exercises you can do at home. Ask your health care provider or a physical therapist how often you should do your exercise program and how many times to repeat each exercise. General safety tips Follow these safety tips for exercising with chronic knee pain: Get your health care provider's approval before doing any exercises. Start slowly and stop any time an exercise causes pain. Do not exercise if your knee pain is flaring up. Warm up first. Stretching a cold muscle can cause an injury. Do 5-10 minutes of easy movement or light stretching before beginning your exercise routine. Do 5-10 minutes of low-impact activity (like walking or cycling) before starting strengthening exercises. Contact your health care provider any time you have  pain during or after exercising. Exercise may cause discomfort but should not be painful. It is normal to be a little stiff or sore after exercising.  Stretching and range-of-motion exercises Front thigh stretch  Stand up straight and support your body by holding on to a chair or resting one hand on a wall. With your legs straight and close together, bend one knee to lift your heel up toward your buttocks. Using one hand for support, grab your ankle with your free hand. Pull your foot up closer toward your buttocks to feel the stretch in front of your thigh. Hold the stretch for 30 seconds. Repeat __________ times. Complete this exercise __________ times a day. Back thigh stretch  Sit  on the floor with your back straight and your legs out straight in front of you. Place the palms of your hands on the floor and slide them toward your feet as you bend at the hip. Try to touch your nose to your knees and feel the stretch in the back of your thighs. Hold for 30 seconds. Repeat __________ times. Complete this exercise __________ times a day. Calf stretch  Stand facing a wall. Place the palms of your hands flat against the wall, arms extended, and lean slightly against the wall. Get into a lunge position with one leg bent at the knee and the other leg stretched out straight behind you. Keep both feet facing the wall and increase the bend in your knee while keeping the heel of the other leg flat on the ground. You should feel the stretch in your calf. Hold for 30 seconds. Repeat __________ times. Complete this exercise __________ times a day. Strengthening exercises Straight leg lift Lie on your back with one knee bent and the other leg out straight. Slowly lift the straight leg without bending the knee. Lift until your foot is about 12 inches (30 cm) off the floor. Hold for 3-5 seconds and slowly lower your leg. Repeat __________ times. Complete this exercise __________ times a day. Single leg dip Stand between two chairs and put both hands on the backs of the chairs for support. Extend one leg out straight with your body weight resting on the heel of the standing leg. Slowly bend your standing knee to dip your body to the level that is comfortable for you. Hold for 3-5 seconds. Repeat __________ times. Complete this exercise __________ times a day. Hamstring curls Stand straight, knees close together, facing the back of a chair. Hold on to the back of a chair with both hands. Keep one leg straight. Bend the other knee while bringing the heel up toward the buttock until the knee is bent at a 90-degree angle (right angle). Hold for 3-5 seconds. Repeat __________ times.  Complete this exercise __________ times a day. Wall squat Stand straight with your back, hips, and head against a wall. Step forward one foot at a time with your back still against the wall. Your feet should be 2 feet (61 cm) from the wall at shoulder width. Keeping your back, hips, and head against the wall, slide down the wall to as close of a sitting position as you can get. Hold for 5-10 seconds, then slowly slide back up. Repeat __________ times. Complete this exercise __________ times a day. Step-ups Step up with one foot onto a sturdy platform or stool that is about 6 inches (15 cm) high. Face sideways with one foot on the platform and one on the ground. Place all your weight  on the platform foot and lift your body off the ground until your knee extends. Let your other leg hang free to the side. Hold for 3-5 seconds then slowly lower your weight down to the floor foot. Repeat __________ times. Complete this exercise __________ times a day. Contact a health care provider if: Your exercise causes pain. Your pain is worse after you exercise. Your pain prevents you from doing your exercises. This information is not intended to replace advice given to you by your health care provider. Make sure you discuss any questions you have with your health care provider. Document Revised: 11/13/2019 Document Reviewed: 07/07/2019 Elsevier Patient Education  [place].

## 2022-09-22 ENCOUNTER — Other Ambulatory Visit: Payer: Self-pay | Admitting: Family Medicine

## 2022-09-22 MED ORDER — TRAMADOL HCL 50 MG PO TABS: 50.0000 mg | ORAL_TABLET | Freq: Three times a day (TID) | ORAL | 0 refills | Status: DC | PRN

## 2022-10-19 DIAGNOSIS — M170 Bilateral primary osteoarthritis of knee: Secondary | ICD-10-CM | POA: Diagnosis not present

## 2022-10-24 ENCOUNTER — Telehealth: Payer: Self-pay | Admitting: *Deleted

## 2022-10-24 NOTE — Telephone Encounter (Signed)
   Pre-operative Risk Assessment    Patient Name: Rebecca Brooks  DOB: 31-Oct-1950 MRN: [id number]      Request for Surgical Clearance    Procedure:   Left Total Knee Arthroplasty.  Date of Surgery:  Clearance 12/23/22                                 Surgeon:  Dr. Gaynelle Arabian Surgeon's Group or Practice Name:  [facility] Phone number:  [phone] Fax number:  [phone]   Type of Clearance Requested:   - Medical  - Pharmacy:  Hold Apixaban (Eliquis) Not Indicated.   Type of Anesthesia:   Choice   Additional requests/questions:    Signed, Greer Ee   10/24/2022, 6:55 AM

## 2022-10-24 NOTE — Telephone Encounter (Signed)
Pharmacy please advise on holding Eliquis prior to left total knee arthroplasty scheduled for 12/23/2022. Thank you.

## 2022-10-25 NOTE — Telephone Encounter (Signed)
   Name: Rebecca Brooks  DOB: 1950/10/07  MRN: [id number]  Primary Cardiologist: Evalina Field, MD   Preoperative team, please contact this patient and set up a phone call appointment for further preoperative risk assessment. Please obtain consent and complete medication review. Thank you for your help.  I confirm that guidance regarding antiplatelet and oral anticoagulation therapy has been completed and, if necessary, noted below.  Per office protocol, patient can hold Eliquis for 3 days prior to procedure.   Patient will not need bridging with Lovenox (enoxaparin) around procedure.   [name], Marissa Nestle, NP 10/25/2022, 3:05 PM [place]

## 2022-10-25 NOTE — Telephone Encounter (Signed)
Patient with diagnosis of atrial fibrillation on Eliquis for anticoagulation.    Procedure:   Left Total Knee Arthroplasty.   Date of Surgery:  Clearance 12/23/22    CHA2DS2-VASc Score = 3   This indicates a 3.2% annual risk of stroke. The patient's score is based upon: CHF History: 0 HTN History: 1 Diabetes History: 0 Stroke History: 0 Vascular Disease History: 0 Age Score: 1 Gender Score: 1   CrCl 90 Platelet count 407  Per office protocol, patient can hold Eliquis for 3 days prior to procedure.   Patient will not need bridging with Lovenox (enoxaparin) around procedure.  **This guidance is not considered finalized until pre-operative APP has relayed final recommendations.**

## 2022-10-26 NOTE — Telephone Encounter (Signed)
Pt called back and has stated she will just see Dr. Audie Box 01/01/23 as planned and have him assess for cardiac clearance. Pt thanked [place] for calling her and asking her what she wanted to do. I assured the pt that I will update the surgeon's office of plan of care as well. Pt said thank you. I will update appt notes for Dr. Audie Box need pre op clearance. Pt aware that once MD has cleared her, he will fax over his ov notes stating that he has given clearance and any medication recommendations.

## 2022-10-26 NOTE — Telephone Encounter (Signed)
Call placed to pt regarding surgical clearance.  Pt has an in office appointment with Dr. Audie Box 01/01/23, per EPIC surgery scheduled for 01/22/23.  Just want to make sure pt is ok with her clearance being addressed during that appointment or if we need to schedule a tele visit.  Left pt a message to call back and ask for the preop team.

## 2022-10-27 ENCOUNTER — Other Ambulatory Visit: Payer: Self-pay | Admitting: Family Medicine

## 2022-10-30 ENCOUNTER — Other Ambulatory Visit: Payer: Self-pay | Admitting: Family Medicine

## 2022-10-30 NOTE — Assessment & Plan Note (Signed)
Patient encouraged to maintain heart healthy diet, regular exercise, adequate sleep. Consider daily probiotics. Take medications as prescribed. Labs ordered and reviewed. She continues to follow with Dr Mitchel Honour of GYN for pap and mammogram. colonocopy in 2017 repeat in 10 years. Dexa 08/2022

## 2022-10-30 NOTE — Assessment & Plan Note (Signed)
Encouraged to get adequate exercise, calcium and vitamin d intake 

## 2022-10-31 ENCOUNTER — Ambulatory Visit (INDEPENDENT_AMBULATORY_CARE_PROVIDER_SITE_OTHER): Payer: Medicare Other | Admitting: Family Medicine

## 2022-10-31 VITALS — BP 128/78 | HR 66 | Temp 98.0°F | Resp 16 | Ht 61.0 in | Wt 209.0 lb

## 2022-10-31 DIAGNOSIS — M109 Gout, unspecified: Secondary | ICD-10-CM | POA: Diagnosis not present

## 2022-10-31 DIAGNOSIS — I10 Essential (primary) hypertension: Secondary | ICD-10-CM

## 2022-10-31 DIAGNOSIS — E782 Mixed hyperlipidemia: Secondary | ICD-10-CM | POA: Diagnosis not present

## 2022-10-31 DIAGNOSIS — G4700 Insomnia, unspecified: Secondary | ICD-10-CM

## 2022-10-31 DIAGNOSIS — G2581 Restless legs syndrome: Secondary | ICD-10-CM | POA: Diagnosis not present

## 2022-10-31 DIAGNOSIS — M8580 Other specified disorders of bone density and structure, unspecified site: Secondary | ICD-10-CM

## 2022-10-31 DIAGNOSIS — Z79899 Other long term (current) drug therapy: Secondary | ICD-10-CM | POA: Diagnosis not present

## 2022-10-31 DIAGNOSIS — R739 Hyperglycemia, unspecified: Secondary | ICD-10-CM

## 2022-10-31 DIAGNOSIS — E669 Obesity, unspecified: Secondary | ICD-10-CM

## 2022-10-31 DIAGNOSIS — Z0000 Encounter for general adult medical examination without abnormal findings: Secondary | ICD-10-CM

## 2022-10-31 LAB — COMPREHENSIVE METABOLIC PANEL
ALT: 27 U/L (ref 0–35)
AST: 17 U/L (ref 0–37)
Albumin: 3.9 g/dL (ref 3.5–5.2)
Alkaline Phosphatase: 72 U/L (ref 39–117)
BUN: 26 mg/dL — ABNORMAL HIGH (ref 6–23)
CO2: 30 mEq/L (ref 19–32)
Calcium: 9.3 mg/dL (ref 8.4–10.5)
Chloride: 102 mEq/L (ref 96–112)
Creatinine, Ser: 0.68 mg/dL (ref 0.40–1.20)
GFR: 87.06 mL/min (ref >60.00)
Glucose, Bld: 85 mg/dL (ref 70–99)
Potassium: 4.3 mEq/L (ref 3.5–5.1)
Sodium: 140 mEq/L (ref 135–145)
Total Bilirubin: 0.5 mg/dL (ref 0.2–1.2)
Total Protein: 5.9 g/dL — ABNORMAL LOW (ref 6.0–8.3)

## 2022-10-31 LAB — LIPID PANEL
Cholesterol: 166 mg/dL (ref 0–200)
HDL: 53.3 mg/dL (ref >39.00)
LDL Cholesterol: 99 mg/dL (ref 0–99)
NonHDL: 113.16
Total CHOL/HDL Ratio: 3
Triglycerides: 71 mg/dL (ref 0.0–149.0)
VLDL: 14.2 mg/dL (ref 0.0–40.0)

## 2022-10-31 LAB — CBC WITH DIFFERENTIAL/PLATELET
Basophils Absolute: 0 10*3/uL (ref 0.0–0.1)
Basophils Relative: 0.5 % (ref 0.0–3.0)
Eosinophils Absolute: 0.1 10*3/uL (ref 0.0–0.7)
Eosinophils Relative: 0.6 % (ref 0.0–5.0)
HCT: 40.8 % (ref 36.0–46.0)
Hemoglobin: 14 g/dL (ref 12.0–15.0)
Lymphocytes Relative: 12.8 % (ref 12.0–46.0)
Lymphs Abs: 1.4 10*3/uL (ref 0.7–4.0)
MCHC: 34.2 g/dL (ref 30.0–36.0)
MCV: 92.8 fl (ref 78.0–100.0)
Monocytes Absolute: 0.9 10*3/uL (ref 0.1–1.0)
Monocytes Relative: 8.1 % (ref 3.0–12.0)
Neutro Abs: 8.3 10*3/uL — ABNORMAL HIGH (ref 1.4–7.7)
Neutrophils Relative %: 78 % — ABNORMAL HIGH (ref 43.0–77.0)
Platelets: 331 10*3/uL (ref 150.0–400.0)
RBC: 4.4 Mil/uL (ref 3.87–5.11)
RDW: 14.1 % (ref 11.5–15.5)
WBC: 10.7 10*3/uL — ABNORMAL HIGH (ref 4.0–10.5)

## 2022-10-31 LAB — TSH: TSH: 0.51 u[IU]/mL (ref 0.35–5.50)

## 2022-10-31 LAB — HEMOGLOBIN A1C: Hgb A1c MFr Bld: 5.4 % (ref 4.6–6.5)

## 2022-10-31 LAB — URIC ACID: Uric Acid, Serum: 4.3 mg/dL (ref 2.4–7.0)

## 2022-10-31 MED ORDER — ROPINIROLE HCL 2 MG PO TABS
2.0000 mg | ORAL_TABLET | Freq: Every day | ORAL | 5 refills | Status: DC
Start: 2022-10-31 — End: 2023-06-27

## 2022-10-31 MED ORDER — ZOLPIDEM TARTRATE 10 MG PO TABS
10.0000 mg | ORAL_TABLET | Freq: Every evening | ORAL | 1 refills | Status: DC | PRN
Start: 2022-10-31 — End: 2023-01-23

## 2022-10-31 NOTE — Assessment & Plan Note (Signed)
Had an elevated uric acid about a decade ago but with weight loss and dietary changes has done well since. She does note some pain in right big toe at the edge of the nail medially likely an ingrown toenail she has a podiatrist she goes to church with she wants to see she will get [place] his name and we can refer but we will also recheck her uric acid level tody

## 2022-10-31 NOTE — Assessment & Plan Note (Signed)
Will increase her Ropinirole to 2 mg qhs to see if that helps more she is noting increased restlessness and discomfort mostly in evenings in hands and legs. She questions if her simvastatin is playing a role she will hold that for 30 days and see if that helps also

## 2022-10-31 NOTE — Assessment & Plan Note (Signed)
Patient encouraged to maintain heart healthy diet, regular exercise, adequate sleep. Consider daily probiotics. Take medications as prescribed. Labs ordered and reviewed. Given and reviewed copy of ACP documents from [place] Secretary of State and encouraged to complete and return 

## 2022-10-31 NOTE — Patient Instructions (Addendum)
RSV, Respiratory Syncitial Virus vaccine, Arexvy at pharmacy  Shingrix is the new shingles shot, 2 shots over 2-6 months, confirm coverage with insurance and document, then can return here for shots with nurse appt or at pharmacy   Tetanus at the pharmacy   Bone density shows osteopenia, which is thinner than normal but not as bad as osteoporosis. Recommend calcium intake of 1200 to 1500 mg daily, divided into roughly 3 doses. Best source is the diet and a single dairy serving is about 500 mg, a supplement of calcium citrate once or twice daily to balance diet is fine if not getting enough in diet. Also need Vitamin D 2000 IU caps, 1 cap daily if not already taking vitamin D. Also recommend weight baring exercise on hips and upper body to keep bones strong    Preventive Care 65 Years and Older, Female Preventive care refers to lifestyle choices and visits with your health care provider that can promote health and wellness. Preventive care visits are also called wellness exams. What can I expect for my preventive care visit? Counseling Your health care provider may ask you questions about your: Medical history, including: Past medical problems. Family medical history. Pregnancy and menstrual history. History of falls. Current health, including: Memory and ability to understand (cognition). Emotional well-being. Home life and relationship well-being. Sexual activity and sexual health. Lifestyle, including: Alcohol, nicotine or tobacco, and drug use. Access to firearms. Diet, exercise, and sleep habits. Work and work Astronomer. Sunscreen use. Safety issues such as seatbelt and bike helmet use. Physical exam Your health care provider will check your: Height and weight. These may be used to calculate your BMI (body mass index). BMI is a measurement that tells if you are at a healthy weight. Waist circumference. This measures the distance around your waistline. This measurement also  tells if you are at a healthy weight and may help predict your risk of certain diseases, such as type 2 diabetes and high blood pressure. Heart rate and blood pressure. Body temperature. Skin for abnormal spots. What immunizations do I need?  Vaccines are usually given at various ages, according to a schedule. Your health care provider will recommend vaccines for you based on your age, medical history, and lifestyle or other factors, such as travel or where you work. What tests do I need? Screening Your health care provider may recommend screening tests for certain conditions. This may include: Lipid and cholesterol levels. Hepatitis C test. Hepatitis B test. HIV (human immunodeficiency virus) test. STI (sexually transmitted infection) testing, if you are at risk. Lung cancer screening. Colorectal cancer screening. Diabetes screening. This is done by checking your blood sugar (glucose) after you have not eaten for a while (fasting). Mammogram. Talk with your health care provider about how often you should have regular mammograms. BRCA-related cancer screening. This may be done if you have a family history of breast, ovarian, tubal, or peritoneal cancers. Bone density scan. This is done to screen for osteoporosis. Talk with your health care provider about your test results, treatment options, and if necessary, the need for more tests. Follow these instructions at home: Eating and drinking  Eat a diet that includes fresh fruits and vegetables, whole grains, lean protein, and low-fat dairy products. Limit your intake of foods with high amounts of sugar, saturated fats, and salt. Take vitamin and mineral supplements as recommended by your health care provider. Do not drink alcohol if your health care provider tells you not to drink. If you drink  alcohol: Limit how much you have to 0-1 drink a day. Know how much alcohol is in your drink. In the [place] one drink equals one 12 oz bottle of beer  (355 mL), one 5 oz glass of wine (148 mL), or one 1 oz glass of hard liquor (44 mL). Lifestyle Brush your teeth every morning and night with fluoride toothpaste. Floss one time each day. Exercise for at least 30 minutes 5 or more days each week. Do not use any products that contain nicotine or tobacco. These products include cigarettes, chewing tobacco, and vaping devices, such as e-cigarettes. If you need help quitting, ask your health care provider. Do not use drugs. If you are sexually active, practice safe sex. Use a condom or other form of protection in order to prevent STIs. Take aspirin only as told by your health care provider. Make sure that you understand how much to take and what form to take. Work with your health care provider to find out whether it is safe and beneficial for you to take aspirin daily. Ask your health care provider if you need to take a cholesterol-lowering medicine (statin). Find healthy ways to manage stress, such as: Meditation, yoga, or listening to music. Journaling. Talking to a trusted person. Spending time with friends and family. Minimize exposure to UV radiation to reduce your risk of skin cancer. Safety Always wear your seat belt while driving or riding in a vehicle. Do not drive: If you have been drinking alcohol. Do not ride with someone who has been drinking. When you are tired or distracted. While texting. If you have been using any mind-altering substances or drugs. Wear a helmet and other protective equipment during sports activities. If you have firearms in your house, make sure you follow all gun safety procedures. What's next? Visit your health care provider once a year for an annual wellness visit. Ask your health care provider how often you should have your eyes and teeth checked. Stay up to date on all vaccines. This information is not intended to replace advice given to you by your health care provider. Make sure you discuss any  questions you have with your health care provider. Document Revised: 01/05/2021 Document Reviewed: 01/05/2021 Elsevier Patient Education  [place].

## 2022-10-31 NOTE — Progress Notes (Signed)
Subjective:   By signing my name below, I, Vickey Sages, attest that this documentation has been prepared under the direction and in the presence of Bradd Canary, MD., 10/31/2022.   [name], female    DOB: [birth year], 72 y.o.   MRN: [id number]  Chief Complaint  Patient presents with   Follow-up    Follow Up   HPI Patient is in today for a comprehensive physical exam and follow up on chronic medical concerns.  AFIB (atrial fibrillation) Patient reports that she was hospitalized in 04/2022 due to an episode of AFIB. She currently takes Diltiazem 240 mg and Eliquis 5 mg to manage AFIB but complains that these medications are causing bilateral bruising on her arms. Additionally, she currently takes Simvastatin 40 mg to manage hyperlipidemia but complains that this is causing myalgias. Today she denies chest pain/palpitations/SOB/fever/chills/ headache/GI or GU concerns.  Great Toe Pain (left foot) Patient complains of great toe pain in the left foot that worsens when wearing closed-toe shoes. She suspects this could be due to an ingrown toenail or gout, though her last uric acid level from 03/2022 was normal.  Lab Results  Component Value Date   LABURIC 4.3 10/31/2022   Insomnia/Restless Leg Syndrome Patient states that she is infrequently taking Zolpidem 10 mg at bedtime to help her sleep. However, she suspects that her tablets are expired and is requesting a refill today. She is also taking Ropinirole 1 mg at bedtime to manage restless leg syndrome.  Itching/Nerve Pain Patient complains that she has been experiencing recurrent itching at night bilaterally on her arms, hands, and legs.  She states that she has been managing this with ice which provides temporary relief. However, the extensive itching is causing lesions to the skin. She has taken Gabapentin after back surgery in 09/2021 but states that this caused drowsiness.  Weight Management Patient  reports that she has lost 60 lbs since 03/2022 through participation in the [employer] weight-loss program. She states that she is now at an acceptable weight to undergo knee replacement surgery on 01/22/2023. Body mass index is 39.49 kg/m. Wt Readings from Last 3 Encounters:  10/31/22 209 lb (94.8 kg)  09/21/22 219 lb 9.6 oz (99.6 kg)  09/13/22 219 lb (99.3 kg)   Supplements Patient currently takes vitamin D 1000 IU daily.  Past Medical History:  Diagnosis Date   Abnormal nuclear stress test    False positive. Cath 08/23/15 showed Angiographically normal coronary arteries   Anemia    Ankle fracture    Arthritis    Asthma    "asthmatic bronchitis"   Atrial fibrillation (HCC)    Back pain    Chicken pox as a child   Edema of both lower extremities    Essential hypertension 07/17/2011   ACEI d/c 06/2011 for refractory cough> resolved    GERD (gastroesophageal reflux disease)    Hyperlipidemia    Hypertension    Hypothyroidism    Insomnia 11/01/2014   Insulin resistance    pt reports she was told this in the past, does not take any medication for this   Joint pain    Low back pain radiating to right leg    intermittent and occasional numbness of skin over right hip in certain positions   Measles as a child   Mumps as a child   Obesity    Osteoarthritis    Palpitation    Pneumonia    this October 2013 from which she has  said inhailer   PONV (postoperative nausea and vomiting)    "violent vomiting after surgery"   Recurrent epistaxis 02/20/2012   RLS (restless legs syndrome) 11/01/2014   Skin cancer    Sleep apnea    SOB (shortness of breath) on exertion    Spinal stenosis    Vitamin B 12 deficiency    Vitamin D deficiency     Past Surgical History:  Procedure Laterality Date   CARDIAC CATHETERIZATION N/A 08/23/2015   Procedure: Left Heart Cath and Coronary Angiography;  Surgeon: Tonny Bollman, MD;  Location: [facility] INVASIVE CV LAB;  Service: Cardiovascular;  Laterality:  N/A;   CESAREAN SECTION     X 3   GASTRECTOMY  12/2017   INGUINAL HERNIA REPAIR Right 72 yrs old   LUMBAR LAMINECTOMY/DECOMPRESSION MICRODISCECTOMY Bilateral 10/21/2021   Procedure: Laminectomy and Foraminotomy - bilateral - Lumbar Three-Lumbar Four - Lumbar Four- Lumbar Five;  Surgeon: Julio Sicks, MD;  Location: [facility] OR;  Service: Neurosurgery;  Laterality: Bilateral;   THYROIDECTOMY     total for benign tumor, Parathyroid spared   TONSILLECTOMY     TOTAL HIP ARTHROPLASTY Right 2006   secondary to congenital  hip defect    Family History  Problem Relation Age of Onset   Cancer Mother    Heart failure Mother    Lung cancer Mother 54       smoker   Heart Problems Mother        tachycardia   Hearing loss Mother    Anxiety disorder Mother        anxiety, claustrophobia   Obesity Mother    Osteoarthritis Father    Hyperlipidemia Father    Anuerysm Father        AAA   Sudden death Father    Hyperlipidemia Sister    Migraines Sister    Hypertension Sister    Heart Problems Sister        tachycardia   Colon cancer Maternal Grandmother    Heart attack Maternal Grandfather    Aneurysm Paternal Grandfather        abdominal   Heart disease Paternal Grandfather        AAA rupture, smoker    Social History   Socioeconomic History   Marital status: Married    Spouse name: Not on file   Number of children: 3   Years of education: Not on file   Highest education level: Not on file  Occupational History   Occupation: Tree surgeon   Occupation: Careers information officer   Occupation: Retired Education administrator  Tobacco Use   Smoking status: Former    Packs/day: 0.50    Years: 5.00    Additional pack years: 0.00    Total pack years: 2.50    Types: Cigarettes    Quit date: 07/25/1971    Years since quitting: 51.3    Passive exposure: Past   Smokeless tobacco: Never  Vaping Use   Vaping Use: Never used  Substance and Sexual Activity   Alcohol use: Yes    Alcohol/week: 7.0 standard drinks of alcohol     Types: 7 Glasses of wine per week   Drug use: No   Sexual activity: Never    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  Other Topics Concern   Not on file  Social History Narrative   Not on file   Social Determinants of Health   Financial Resource Strain: Low Risk  (09/13/2022)   Overall Financial Resource Strain (CARDIA)  Difficulty of Paying Living Expenses: Not very hard  Food Insecurity: No Food Insecurity (09/13/2022)   Hunger Vital Sign    Worried About Running Out of Food in the Last Year: Never true    Ran Out of Food in the Last Year: Never true  Transportation Needs: No Transportation Needs (09/13/2022)   PRAPARE - Administrator, Civil Service (Medical): No    Lack of Transportation (Non-Medical): No  Physical Activity: Inactive (09/13/2022)   Exercise Vital Sign    Days of Exercise per Week: 0 days    Minutes of Exercise per Session: 0 min  Stress: No Stress Concern Present (09/13/2022)   [employer] of Occupational Health - Occupational Stress Questionnaire    Feeling of Stress : Only a little  Social Connections: Socially Integrated (09/13/2022)   Social Connection and Isolation Panel [NHANES]    Frequency of Communication with Friends and Family: Twice a week    Frequency of Social Gatherings with Friends and Family: Once a week    Attends Religious Services: More than 4 times per year    Active Member of [employer] or Organizations: Yes    Attends Engineer, structural: More than 4 times per year    Marital Status: Married  Catering manager Violence: Not At Risk (09/13/2022)   Humiliation, Afraid, Rape, and Kick questionnaire    Fear of Current or Ex-Partner: No    Emotionally Abused: No    Physically Abused: No    Sexually Abused: No    Outpatient Medications Prior to Visit  Medication Sig Dispense Refill   acetaminophen (TYLENOL) 500 MG tablet Take 1,000 mg by mouth in the morning and at bedtime.     albuterol  (VENTOLIN HFA) 108 (90 Base) MCG/ACT inhaler Inhale 1-2 puffs into the lungs every 6 (six) hours as needed for wheezing or shortness of breath. 1 each 0   apixaban (ELIQUIS) 5 MG TABS tablet Take 1 tablet (5 mg total) by mouth 2 (two) times daily. 60 tablet 11   benzonatate (TESSALON) 200 MG capsule Take 1 capsule (200 mg total) by mouth 3 (three) times daily as needed for cough. 30 capsule 0   cetirizine (ZYRTEC) 10 MG tablet TAKE 1 TABLET(10 MG) BY MOUTH DAILY 90 tablet 1   cyclobenzaprine (FLEXERIL) 10 MG tablet Take 1 tablet (10 mg total) by mouth 3 (three) times daily as needed for muscle spasms. 30 tablet 0   diclofenac Sodium (VOLTAREN) 1 % GEL Apply 2-4 g topically 4 (four) times daily as needed. (Patient taking differently: Apply 2-4 g topically 4 (four) times daily as needed (for arthritis).) 350 g 5   diltiazem (CARDIZEM CD) 240 MG 24 hr capsule Take 1 capsule (240 mg total) by mouth daily. 90 capsule 3   famotidine (PEPCID) 20 MG tablet TAKE 1 TABLET(20 MG) BY MOUTH AT BEDTIME 90 tablet 1   fluticasone (FLONASE) 50 MCG/ACT nasal spray SHAKE LIQUID AND USE 2 SPRAYS IN EACH NOSTRIL AT BEDTIME 16 g 5   furosemide (LASIX) 20 MG tablet Take 1 tablet (20 mg total) by mouth daily as needed for fluid or edema (weight gain > 3#/24 hr). 180 tablet 1   levothyroxine (SYNTHROID) 100 MCG tablet TAKE 1 TABLET(100 MCG) BY MOUTH DAILY BEFORE BREAKFAST 90 tablet 0   metoprolol tartrate (LOPRESSOR) 50 MG tablet TAKE 1 TABLET(50 MG) BY MOUTH TWICE DAILY 180 tablet 3   pantoprazole (PROTONIX) 40 MG tablet TAKE 1 TABLET(40 MG) BY MOUTH DAILY 90 tablet  3   simvastatin (ZOCOR) 40 MG tablet TAKE 1 TABLET(40 MG) BY MOUTH AT BEDTIME 90 tablet 3   traMADol (ULTRAM) 50 MG tablet Take 1 tablet (50 mg total) by mouth 3 (three) times daily as needed for moderate pain. 90 tablet 0   Vitamin D3 (VITAMIN D) 25 MCG tablet Take 1 tablet (1,000 Units total) by mouth daily. 60 tablet 1   rOPINIRole (REQUIP) 1 MG tablet TAKE 1  TABLET(1 MG) BY MOUTH AT BEDTIME 90 tablet 0   zolpidem (AMBIEN) 10 MG tablet Take 1 tablet (10 mg total) by mouth at bedtime as needed for sleep. 30 tablet 2   No facility-administered medications prior to visit.    Allergies  Allergen Reactions   Bee Venom Anaphylaxis   Accupril [Quinapril Hcl] Cough   Quinapril-Hydrochlorothiazide Cough   Rosuvastatin Other (See Comments)    Leg aches.    Review of Systems  Constitutional:  Negative for chills and fever.  Respiratory:  Negative for shortness of breath.   Cardiovascular:  Negative for chest pain and palpitations.  Gastrointestinal:  Negative for abdominal pain, blood in stool, constipation, diarrhea, nausea and vomiting.  Genitourinary:  Negative for dysuria, frequency, hematuria and urgency.  Musculoskeletal:  Positive for myalgias.       (+) left great toe pain.  Skin:  Positive for itching (bilateral itching on arms, hands, and legs.).       (+) bilateral bruising on the arms.   Neurological:  Negative for headaches.      Objective:    Physical Exam Constitutional:      General: She is not in acute distress.    Appearance: Normal appearance. She is obese. She is not ill-appearing.  HENT:     Head: Normocephalic and atraumatic.     Right Ear: Tympanic membrane, ear canal and external ear normal.     Left Ear: Tympanic membrane, ear canal and external ear normal.     Nose: Nose normal.     Mouth/Throat:     Mouth: Mucous membranes are moist.     Pharynx: Oropharynx is clear.  Eyes:     General:        Right eye: No discharge.        Left eye: No discharge.     Extraocular Movements: Extraocular movements intact.     Right eye: No nystagmus.     Left eye: No nystagmus.     Pupils: Pupils are equal, round, and reactive to light.  Neck:     Vascular: No carotid bruit.  Cardiovascular:     Rate and Rhythm: Normal rate and regular rhythm.     Pulses: Normal pulses.     Heart sounds: Murmur heard.     Systolic  murmur is present with a grade of 1/6.     No gallop.  Pulmonary:     Effort: Pulmonary effort is normal. No respiratory distress.     Breath sounds: Normal breath sounds. No wheezing or rales.  Abdominal:     General: Bowel sounds are normal.     Palpations: Abdomen is soft.     Tenderness: There is no abdominal tenderness. There is no guarding.  Musculoskeletal:        General: Normal range of motion.     Cervical back: Normal range of motion.     Right lower leg: No edema.     Left lower leg: No edema.     Comments: Muscle strength 5/5 on upper and  lower extremities.   Lymphadenopathy:     Cervical: No cervical adenopathy.  Skin:    General: Skin is warm and dry.  Neurological:     Mental Status: She is alert and oriented to person, place, and time.     Sensory: Sensation is intact.     Motor: Motor function is intact.     Coordination: Coordination is intact.     Deep Tendon Reflexes:     Reflex Scores:      Patellar reflexes are 2+ on the right side and 2+ on the left side. Psychiatric:        Mood and Affect: Mood normal.        Behavior: Behavior normal.        Judgment: Judgment normal.     BP 128/78 (BP Location: Right Arm, Patient Position: Sitting, Cuff Size: Normal)   Pulse 66   Temp 98 F (36.7 C) (Oral)   Resp 16   Ht  (1.549 m)   Wt 209 lb (94.8 kg)   SpO2 97%   BMI 39.49 kg/m  Wt Readings from Last 3 Encounters:  10/31/22 209 lb (94.8 kg)  09/21/22 219 lb 9.6 oz (99.6 kg)  09/13/22 219 lb (99.3 kg)    Diabetic Foot Exam - Simple   No data filed    Lab Results  Component Value Date   WBC 10.7 (H) 10/31/2022   HGB 14.0 10/31/2022   HCT 40.8 10/31/2022   PLT 331.0 10/31/2022   GLUCOSE 85 10/31/2022   CHOL 166 10/31/2022   TRIG 71.0 10/31/2022   HDL 53.30 10/31/2022   LDLCALC 99 10/31/2022   ALT 27 10/31/2022   AST 17 10/31/2022   NA 140 10/31/2022   K 4.3 10/31/2022   CL 102 10/31/2022   CREATININE 0.68 10/31/2022   BUN 26 (H)  10/31/2022   CO2 30 10/31/2022   TSH 0.51 10/31/2022   INR 0.99 08/19/2015   HGBA1C 5.4 10/31/2022    Lab Results  Component Value Date   TSH 0.51 10/31/2022   Lab Results  Component Value Date   WBC 10.7 (H) 10/31/2022   HGB 14.0 10/31/2022   HCT 40.8 10/31/2022   MCV 92.8 10/31/2022   PLT 331.0 10/31/2022   Lab Results  Component Value Date   NA 140 10/31/2022   K 4.3 10/31/2022   CHLORIDE 103 12/21/2016   CO2 30 10/31/2022   GLUCOSE 85 10/31/2022   BUN 26 (H) 10/31/2022   CREATININE 0.68 10/31/2022   BILITOT 0.5 10/31/2022   ALKPHOS 72 10/31/2022   AST 17 10/31/2022   ALT 27 10/31/2022   PROT 5.9 (L) 10/31/2022   ALBUMIN 3.9 10/31/2022   CALCIUM 9.3 10/31/2022   ANIONGAP 12 05/04/2022   EGFR 94 01/26/2021   GFR 87.06 10/31/2022   Lab Results  Component Value Date   CHOL 166 10/31/2022   Lab Results  Component Value Date   HDL 53.30 10/31/2022   Lab Results  Component Value Date   LDLCALC 99 10/31/2022   Lab Results  Component Value Date   TRIG 71.0 10/31/2022   Lab Results  Component Value Date   CHOLHDL 3 10/31/2022   Lab Results  Component Value Date   HGBA1C 5.4 10/31/2022      Assessment & Plan:  Colonoscopy: Last completed on 07/24/2005 with normal results. Cologuard last completed on 03/08/2020 with negative results. Repeat Cologuard ordered in 02/2023 but patient will consider repeat colonoscopy.  DEXA: Last completed  on 09/19/2022. The BMD measured at Femur Neck is 0.778 g/cm2 with a T-score of -1.9. This patient is considered osteopenic/low bone mass according to World Health Organization [facility]) criteria. Repeat DEXA scan recommended in 2-5 years.  Mammogram: Last completed on 09/19/2022 with no mammographic evidence of malignancy. Repeat mammography recommended in 1-2 years.  Pap Smear: Last completed on 02/17/2015 with normal results. Repeat pap smear is not recommended due to patient's age.  Advanced Directives: Encouraged  completion of advanced care planning documents.  Healthy Lifestyle: Encouraged 6-8 hours of sleep, heart healthy diet, 60-80 oz of non-alcohol/non-caffeinated fluids, and minimum of 4000 steps daily.  Immunizations: Encouraged patient to consider RSV and Tetanus immunizations.  Insomnia/Restless Leg Syndrome: Ropinirole increased to 2 mg: take 1 tablet (2 mg total) by mouth at bedtime. Zolpidem 10 mg refilled: take 1 tablet (10 mg total) by mouth at bedtime as needed for sleep.   Labs: Routine blood work ordered today will also check uric acid levels.  Knee Replacement Surgery: Patient will return in 12/2022 for a pre-operation visit before her knee replacement surgery in 01/2023. Problem List Items Addressed This Visit     Annual physical exam - Primary    Patient encouraged to maintain heart healthy diet, regular exercise, adequate sleep. Consider daily probiotics. Take medications as prescribed. Labs ordered and reviewed. She continues to follow with Dr Mitchel Honour of GYN for pap and mammogram. colonocopy in 2017 repeat in 10 years. Dexa 08/2022      Essential hypertension   Relevant Orders   CBC with Differential/Platelet (Completed)   Comprehensive metabolic panel (Completed)   TSH (Completed)   Gout    Had an elevated uric acid about a decade ago but with weight loss and dietary changes has done well since. She does note some pain in right big toe at the edge of the nail medially likely an ingrown toenail she has a podiatrist she goes to church with she wants to see she will get [place] his name and we can refer but we will also recheck her uric acid level tody      Relevant Orders   Uric acid (Completed)   Hyperglycemia   Relevant Orders   Hemoglobin A1c (Completed)   Hyperlipidemia   Relevant Orders   Lipid panel (Completed)   Insomnia   Relevant Medications   zolpidem (AMBIEN) 10 MG tablet   Obesity (BMI 30-39.9)    Has had good success with [employer] weight loss program and  is down enough to qualify for knee replacement now      Osteopenia    Encouraged to get adequate exercise, calcium and vitamin d intake       Preventative health care    Patient encouraged to maintain heart healthy diet, regular exercise, adequate sleep. Consider daily probiotics. Take medications as prescribed Labs ordered and reviewed Given and reviewed copy of ACP documents from [facility] Secretary of State and encouraged to complete and return        RLS (restless legs syndrome)    Will increase her Ropinirole to 2 mg qhs to see if that helps more she is noting increased restlessness and discomfort mostly in evenings in hands and legs. She questions if her simvastatin is playing a role she will hold that for 30 days and see if that helps also      Relevant Medications   rOPINIRole (REQUIP) 2 MG tablet   Other Visit Diagnoses     High risk medication use  Relevant Orders   Drug Monitoring Panel [id number] , Urine      Meds ordered this encounter  Medications   zolpidem (AMBIEN) 10 MG tablet    Sig: Take 1 tablet (10 mg total) by mouth at bedtime as needed for sleep.    Dispense:  15 tablet    Refill:  1   rOPINIRole (REQUIP) 2 MG tablet    Sig: Take 1 tablet (2 mg total) by mouth at bedtime.    Dispense:  30 tablet    Refill:  5   I, Danise EdgeStacey Elex Mainwaring, MD, personally preformed the services described in this documentation.  All medical record entries made by the scribe were at my direction and in my presence.  I have reviewed the chart and discharge instructions (if applicable) and agree that the record reflects my personal performance and is accurate and complete. 10/31/2022  I,Mohammed Iqbal,acting as a scribe for Danise EdgeStacey Joel Cowin, MD.,have documented all relevant documentation on the behalf of Danise EdgeStacey Cristella Stiver, MD,as directed by  Danise EdgeStacey Iolanda Folson, MD while in the presence of Danise EdgeStacey Dayron Odland, MD.  Danise EdgeStacey Jaselle Pryer, MD

## 2022-10-31 NOTE — Assessment & Plan Note (Signed)
Has had good success with [employer] weight loss program and is down enough to qualify for knee replacement now

## 2022-11-02 LAB — DRUG MONITORING PANEL 376104, URINE
Amphetamines: NEGATIVE ng/mL (ref <500)
Barbiturates: NEGATIVE ng/mL (ref <300)
Benzodiazepines: NEGATIVE ng/mL (ref <100)
Cocaine Metabolite: NEGATIVE ng/mL (ref <150)
Desmethyltramadol: 1796 ng/mL — ABNORMAL HIGH (ref <100)
Opiates: NEGATIVE ng/mL (ref <100)
Oxycodone: NEGATIVE ng/mL (ref <100)
Tramadol: 2876 ng/mL — ABNORMAL HIGH (ref <100)

## 2022-11-02 LAB — DM TEMPLATE

## 2022-11-03 DIAGNOSIS — H18413 Arcus senilis, bilateral: Secondary | ICD-10-CM | POA: Diagnosis not present

## 2022-11-03 DIAGNOSIS — Z961 Presence of intraocular lens: Secondary | ICD-10-CM | POA: Diagnosis not present

## 2022-11-03 DIAGNOSIS — H25041 Posterior subcapsular polar age-related cataract, right eye: Secondary | ICD-10-CM | POA: Diagnosis not present

## 2022-11-03 DIAGNOSIS — H2511 Age-related nuclear cataract, right eye: Secondary | ICD-10-CM | POA: Diagnosis not present

## 2022-11-09 DIAGNOSIS — G4733 Obstructive sleep apnea (adult) (pediatric): Secondary | ICD-10-CM | POA: Diagnosis not present

## 2022-11-13 ENCOUNTER — Encounter: Payer: Self-pay | Admitting: Family Medicine

## 2022-11-13 ENCOUNTER — Other Ambulatory Visit: Payer: Self-pay | Admitting: Family Medicine

## 2022-11-13 DIAGNOSIS — H5211 Myopia, right eye: Secondary | ICD-10-CM | POA: Diagnosis not present

## 2022-11-13 MED ORDER — TRAMADOL HCL 50 MG PO TABS: 50.0000 mg | ORAL_TABLET | Freq: Three times a day (TID) | ORAL | 0 refills | Status: DC | PRN

## 2022-11-13 NOTE — Telephone Encounter (Signed)
Requesting: tramadol  Contract: 11/08/22 UDS: 10/31/22 Last Visit: 10/31/22 Next Visit: 05/01/23 Last Refill: 09/22/22 #90 and 0RF   Please Advise

## 2022-11-27 ENCOUNTER — Encounter: Payer: Self-pay | Admitting: Family Medicine

## 2022-11-27 DIAGNOSIS — R197 Diarrhea, unspecified: Secondary | ICD-10-CM

## 2022-11-28 ENCOUNTER — Other Ambulatory Visit: Payer: Medicare Other

## 2022-11-28 NOTE — Telephone Encounter (Signed)
Pt's husband came to lab to drop off a stool sample.  Stool was liquid in a zip lock bag. No future orders were in Epic.  Saw this mychart message and discussed it with spouse and advised him we could not accept stool from a ziplock bag.  Specimen would need to be re-collected and placed in appropriate containers at time of collection. I placed future orders per [employer]. Spouse also stated that pt has not been on any antibiotics in the last 2 weeks. Pt spouse also asked if there was a lab closer to their address that he could drop specimens off at. He will drop them off at [facility] lab.

## 2022-11-29 ENCOUNTER — Ambulatory Visit: Payer: Medicare Other

## 2022-11-29 DIAGNOSIS — R197 Diarrhea, unspecified: Secondary | ICD-10-CM

## 2022-11-29 NOTE — Telephone Encounter (Signed)
Called pt regarding stool kit and  she stated going to drop stool sample off To  office today. Pt was advised we will call when get results back.

## 2022-11-30 LAB — FECAL LACTOFERRIN, QUANT
Fecal Lactoferrin: NEGATIVE
MICRO NUMBER:: 14930157
SPECIMEN QUALITY:: ADEQUATE

## 2022-11-30 NOTE — Telephone Encounter (Signed)
Called pt was advised waiting on results note and when Dr.Blyth send  It to me will give her a call. Told to look at her mychart as well it may be up later.

## 2022-12-01 ENCOUNTER — Other Ambulatory Visit: Payer: Self-pay

## 2022-12-01 DIAGNOSIS — R197 Diarrhea, unspecified: Secondary | ICD-10-CM

## 2022-12-01 LAB — OVA AND PARASITE EXAMINATION
CONCENTRATE RESULT:: NONE SEEN
MICRO NUMBER:: 14929861
SPECIMEN QUALITY:: ADEQUATE
TRICHROME RESULT:: NONE SEEN

## 2022-12-01 LAB — CLOSTRIDIUM DIFFICILE BY PCR: Toxigenic C. Difficile by PCR: NEGATIVE

## 2022-12-01 NOTE — Telephone Encounter (Signed)
Spoke with pt and she will get blood work done on Monday  Because she have a meeting with someone

## 2022-12-03 LAB — STOOL CULTURE: E coli, Shiga toxin Assay: NEGATIVE

## 2022-12-04 ENCOUNTER — Other Ambulatory Visit (INDEPENDENT_AMBULATORY_CARE_PROVIDER_SITE_OTHER): Payer: Medicare Other

## 2022-12-04 ENCOUNTER — Telehealth: Payer: Self-pay | Admitting: Family Medicine

## 2022-12-04 DIAGNOSIS — R197 Diarrhea, unspecified: Secondary | ICD-10-CM | POA: Diagnosis not present

## 2022-12-04 DIAGNOSIS — M1712 Unilateral primary osteoarthritis, left knee: Secondary | ICD-10-CM | POA: Diagnosis not present

## 2022-12-04 LAB — COMPREHENSIVE METABOLIC PANEL
ALT: 18 U/L (ref 0–35)
AST: 15 U/L (ref 0–37)
Albumin: 3.8 g/dL (ref 3.5–5.2)
Alkaline Phosphatase: 80 U/L (ref 39–117)
BUN: 21 mg/dL (ref 6–23)
CO2: 29 mEq/L (ref 19–32)
Calcium: 9.5 mg/dL (ref 8.4–10.5)
Chloride: 102 mEq/L (ref 96–112)
Creatinine, Ser: 0.71 mg/dL (ref 0.40–1.20)
GFR: 84.94 mL/min (ref >60.00)
Glucose, Bld: 96 mg/dL (ref 70–99)
Potassium: 3.9 mEq/L (ref 3.5–5.1)
Sodium: 138 mEq/L (ref 135–145)
Total Bilirubin: 0.6 mg/dL (ref 0.2–1.2)
Total Protein: 6.6 g/dL (ref 6.0–8.3)

## 2022-12-04 LAB — CBC WITH DIFFERENTIAL/PLATELET
Basophils Absolute: 0.1 10*3/uL (ref 0.0–0.1)
Basophils Relative: 0.4 % (ref 0.0–3.0)
Eosinophils Absolute: 0.1 10*3/uL (ref 0.0–0.7)
Eosinophils Relative: 0.6 % (ref 0.0–5.0)
HCT: 41.6 % (ref 36.0–46.0)
Hemoglobin: 14 g/dL (ref 12.0–15.0)
Lymphocytes Relative: 15.5 % (ref 12.0–46.0)
Lymphs Abs: 1.8 10*3/uL (ref 0.7–4.0)
MCHC: 33.6 g/dL (ref 30.0–36.0)
MCV: 92.5 fl (ref 78.0–100.0)
Monocytes Absolute: 0.9 10*3/uL (ref 0.1–1.0)
Monocytes Relative: 7.8 % (ref 3.0–12.0)
Neutro Abs: 8.6 10*3/uL — ABNORMAL HIGH (ref 1.4–7.7)
Neutrophils Relative %: 75.7 % (ref 43.0–77.0)
Platelets: 353 10*3/uL (ref 150.0–400.0)
RBC: 4.5 Mil/uL (ref 3.87–5.11)
RDW: 13.9 % (ref 11.5–15.5)
WBC: 11.3 10*3/uL — ABNORMAL HIGH (ref 4.0–10.5)

## 2022-12-04 NOTE — Telephone Encounter (Signed)
Pt said she went to have labs done this morning at [facility] lab and they advised that she may need to repeat stool specimens but told her to check with Dr. Abner Greenspan before going through all of that. I believe she said that the other lab gave her what she needed for the stool tests. Please call her to advise if she does need to retest

## 2022-12-05 ENCOUNTER — Other Ambulatory Visit: Payer: Self-pay | Admitting: Family Medicine

## 2022-12-05 ENCOUNTER — Other Ambulatory Visit: Payer: Self-pay | Admitting: *Deleted

## 2022-12-05 ENCOUNTER — Encounter: Payer: Self-pay | Admitting: Family Medicine

## 2022-12-05 DIAGNOSIS — D72829 Elevated white blood cell count, unspecified: Secondary | ICD-10-CM

## 2022-12-05 DIAGNOSIS — R197 Diarrhea, unspecified: Secondary | ICD-10-CM

## 2022-12-05 MED ORDER — COLESTIPOL HCL 5 G PO GRAN: 5.0000 g | GRANULES | Freq: Two times a day (BID) | ORAL | 5 refills | Status: DC | PRN

## 2022-12-05 NOTE — Telephone Encounter (Signed)
Note from CBC:  WBC is up some. Please check with patient if she has any symptoms of concern. Continuing diarrhea, fevers, cough etc. Repeat cbc with diff in 2-4 weeks to assess for resolution especially if she has no symptoms   Patient notified of lab work.  She is just having the diarrhea and headache.  She will go to [facility] to have cbc redone on or around 12/19/22.  Future orders placed for cbc/diff.  Also she wanted to know if she needed to repeat stool tests (not sure if they were duplicates)?

## 2022-12-05 NOTE — Telephone Encounter (Signed)
Pt was advised stated understand labs ordered  And she seen her mychart message from [facility]

## 2022-12-05 NOTE — Telephone Encounter (Addendum)
Full panel was done already.  Duplicate labs deleted.

## 2022-12-11 DIAGNOSIS — M1712 Unilateral primary osteoarthritis, left knee: Secondary | ICD-10-CM | POA: Diagnosis not present

## 2022-12-14 ENCOUNTER — Other Ambulatory Visit: Payer: Self-pay | Admitting: Family Medicine

## 2022-12-14 ENCOUNTER — Encounter: Payer: Self-pay | Admitting: Family Medicine

## 2022-12-14 DIAGNOSIS — M1712 Unilateral primary osteoarthritis, left knee: Secondary | ICD-10-CM | POA: Diagnosis not present

## 2022-12-14 MED ORDER — CHOLESTYRAMINE 4 G PO PACK: 4.0000 g | PACK | Freq: Two times a day (BID) | ORAL | 3 refills | Status: DC

## 2022-12-20 DIAGNOSIS — M1712 Unilateral primary osteoarthritis, left knee: Secondary | ICD-10-CM | POA: Diagnosis not present

## 2022-12-22 ENCOUNTER — Encounter (HOSPITAL_COMMUNITY): Payer: Self-pay

## 2022-12-25 DIAGNOSIS — M25562 Pain in left knee: Secondary | ICD-10-CM | POA: Diagnosis not present

## 2022-12-26 ENCOUNTER — Encounter: Payer: Self-pay | Admitting: Family Medicine

## 2022-12-26 DIAGNOSIS — E782 Mixed hyperlipidemia: Secondary | ICD-10-CM

## 2022-12-26 DIAGNOSIS — M791 Myalgia, unspecified site: Secondary | ICD-10-CM

## 2022-12-26 DIAGNOSIS — Z789 Other specified health status: Secondary | ICD-10-CM

## 2022-12-26 DIAGNOSIS — R197 Diarrhea, unspecified: Secondary | ICD-10-CM

## 2022-12-27 ENCOUNTER — Other Ambulatory Visit: Payer: Self-pay

## 2022-12-27 ENCOUNTER — Other Ambulatory Visit: Payer: Self-pay | Admitting: Family Medicine

## 2022-12-27 DIAGNOSIS — I10 Essential (primary) hypertension: Secondary | ICD-10-CM | POA: Diagnosis not present

## 2022-12-27 DIAGNOSIS — Z7901 Long term (current) use of anticoagulants: Secondary | ICD-10-CM | POA: Diagnosis not present

## 2022-12-27 DIAGNOSIS — G4730 Sleep apnea, unspecified: Secondary | ICD-10-CM | POA: Diagnosis not present

## 2022-12-27 DIAGNOSIS — K219 Gastro-esophageal reflux disease without esophagitis: Secondary | ICD-10-CM | POA: Diagnosis not present

## 2022-12-27 DIAGNOSIS — Z79899 Other long term (current) drug therapy: Secondary | ICD-10-CM | POA: Diagnosis not present

## 2022-12-27 DIAGNOSIS — Z87891 Personal history of nicotine dependence: Secondary | ICD-10-CM | POA: Diagnosis not present

## 2022-12-27 DIAGNOSIS — M1990 Unspecified osteoarthritis, unspecified site: Secondary | ICD-10-CM | POA: Diagnosis not present

## 2022-12-27 DIAGNOSIS — R197 Diarrhea, unspecified: Secondary | ICD-10-CM

## 2022-12-27 DIAGNOSIS — G4733 Obstructive sleep apnea (adult) (pediatric): Secondary | ICD-10-CM | POA: Diagnosis not present

## 2022-12-27 DIAGNOSIS — E785 Hyperlipidemia, unspecified: Secondary | ICD-10-CM | POA: Diagnosis not present

## 2022-12-27 DIAGNOSIS — H2511 Age-related nuclear cataract, right eye: Secondary | ICD-10-CM | POA: Diagnosis not present

## 2022-12-27 DIAGNOSIS — E669 Obesity, unspecified: Secondary | ICD-10-CM | POA: Diagnosis not present

## 2022-12-27 DIAGNOSIS — E039 Hypothyroidism, unspecified: Secondary | ICD-10-CM | POA: Diagnosis not present

## 2022-12-27 DIAGNOSIS — Z6836 Body mass index (BMI) 36.0-36.9, adult: Secondary | ICD-10-CM | POA: Diagnosis not present

## 2022-12-27 MED ORDER — CHOLESTYRAMINE 4 G PO PACK: 4.0000 g | PACK | Freq: Two times a day (BID) | ORAL | 3 refills | Status: DC

## 2022-12-27 NOTE — Telephone Encounter (Signed)
Called pharmacy they stated pt never picked up medication so it was put back.Pt was advised of what her pharmacy stated and advised it was refilled waiting for her at pharmacy. Pt was advised of referral that was sent as well.

## 2022-12-28 ENCOUNTER — Other Ambulatory Visit (INDEPENDENT_AMBULATORY_CARE_PROVIDER_SITE_OTHER): Payer: Medicare Other

## 2022-12-28 ENCOUNTER — Ambulatory Visit (HOSPITAL_BASED_OUTPATIENT_CLINIC_OR_DEPARTMENT_OTHER)
Admission: RE | Admit: 2022-12-28 | Discharge: 2022-12-28 | Disposition: A | Discharge status: Home / Self Care | Payer: Medicare Other | Source: Ambulatory Visit | Attending: Family Medicine | Admitting: Family Medicine

## 2022-12-28 ENCOUNTER — Other Ambulatory Visit: Payer: Self-pay | Admitting: Family Medicine

## 2022-12-28 ENCOUNTER — Encounter: Payer: Self-pay | Admitting: Family Medicine

## 2022-12-28 DIAGNOSIS — D72829 Elevated white blood cell count, unspecified: Secondary | ICD-10-CM

## 2022-12-28 DIAGNOSIS — R197 Diarrhea, unspecified: Secondary | ICD-10-CM | POA: Diagnosis not present

## 2022-12-28 DIAGNOSIS — H2511 Age-related nuclear cataract, right eye: Secondary | ICD-10-CM | POA: Diagnosis not present

## 2022-12-28 LAB — CBC WITH DIFFERENTIAL/PLATELET
Basophils Absolute: 0.1 10*3/uL (ref 0.0–0.1)
Basophils Relative: 0.8 % (ref 0.0–3.0)
Eosinophils Absolute: 0.1 10*3/uL (ref 0.0–0.7)
Eosinophils Relative: 1.1 % (ref 0.0–5.0)
HCT: 39.4 % (ref 36.0–46.0)
Hemoglobin: 12.8 g/dL (ref 12.0–15.0)
Lymphocytes Relative: 16 % (ref 12.0–46.0)
Lymphs Abs: 1.6 10*3/uL (ref 0.7–4.0)
MCHC: 32.5 g/dL (ref 30.0–36.0)
MCV: 94.3 fl (ref 78.0–100.0)
Monocytes Absolute: 0.8 10*3/uL (ref 0.1–1.0)
Monocytes Relative: 8 % (ref 3.0–12.0)
Neutro Abs: 7.4 10*3/uL (ref 1.4–7.7)
Neutrophils Relative %: 74.1 % (ref 43.0–77.0)
Platelets: 335 10*3/uL (ref 150.0–400.0)
RBC: 4.17 Mil/uL (ref 3.87–5.11)
RDW: 13.8 % (ref 11.5–15.5)
WBC: 10 10*3/uL (ref 4.0–10.5)

## 2022-12-28 MED ORDER — TRAMADOL HCL 50 MG PO TABS: 50.0000 mg | ORAL_TABLET | Freq: Three times a day (TID) | ORAL | 0 refills | Status: DC | PRN

## 2022-12-28 NOTE — Telephone Encounter (Signed)
Requesting: tramadol 50mg   Contract: 10/31/22 UDS:  10/31/22 Last Visit: 10/31/22 Next Visit: 05/01/23 Last Refill: 11/13/22 #90 and 0RF   Please Advise

## 2022-12-28 NOTE — Addendum Note (Signed)
Addended by: Mervin Kung A on: 12/28/2022 08:52 AM   Modules accepted: Orders

## 2022-12-29 ENCOUNTER — Encounter: Payer: Self-pay | Admitting: Family Medicine

## 2022-12-31 NOTE — Progress Notes (Unsigned)
Cardiology Office Note:   Date:  12/31/2022  NAME:  Rebecca Brooks    MRN: [id number] DOB:  04-14-1951   PCP:  Bradd Canary, MD  Cardiologist:  Reatha Harps, MD  Electrophysiologist:  None   Referring MD: Bradd Canary, MD   No chief complaint on file. ***  History of Present Illness:   Rebecca Brooks is a 72 y.o. female with a hx of pAF, obesity, OSA who presents for preop assessment.   Problem List Paroxysmal Atrial Fibrillation  -CHADSVASC=3 (age, HTN, female) -Dx 04/2022 -LHC 2017 normal 2. HTN 3. Obesity 4. OSA  Past Medical History: Past Medical History:  Diagnosis Date   Abnormal nuclear stress test    False positive. Cath 08/23/15 showed Angiographically normal coronary arteries   Anemia    Ankle fracture    Arthritis    Asthma    "asthmatic bronchitis"   Atrial fibrillation (HCC)    Back pain    Chicken pox as a child   Edema of both lower extremities    Essential hypertension 07/17/2011   ACEI d/c 06/2011 for refractory cough> resolved    GERD (gastroesophageal reflux disease)    Hyperlipidemia    Hypertension    Hypothyroidism    Insomnia 11/01/2014   Insulin resistance    pt reports she was told this in the past, does not take any medication for this   Joint pain    Low back pain radiating to right leg    intermittent and occasional numbness of skin over right hip in certain positions   Measles as a child   Mumps as a child   Obesity    Osteoarthritis    Palpitation    Pneumonia    this October 2013 from which she has said inhailer   PONV (postoperative nausea and vomiting)    "violent vomiting after surgery"   Recurrent epistaxis 02/20/2012   RLS (restless legs syndrome) 11/01/2014   Skin cancer    Sleep apnea    SOB (shortness of breath) on exertion    Spinal stenosis    Vitamin B 12 deficiency    Vitamin D deficiency     Past Surgical History: Past Surgical History:  Procedure Laterality Date   CARDIAC  CATHETERIZATION N/A 08/23/2015   Procedure: Left Heart Cath and Coronary Angiography;  Surgeon: Tonny Bollman, MD;  Location: [facility] INVASIVE CV LAB;  Service: Cardiovascular;  Laterality: N/A;   CESAREAN SECTION     X 3   GASTRECTOMY  12/2017   INGUINAL HERNIA REPAIR Right 72 yrs old   LUMBAR LAMINECTOMY/DECOMPRESSION MICRODISCECTOMY Bilateral 10/21/2021   Procedure: Laminectomy and Foraminotomy - bilateral - Lumbar Three-Lumbar Four - Lumbar Four- Lumbar Five;  Surgeon: Julio Sicks, MD;  Location: [facility] OR;  Service: Neurosurgery;  Laterality: Bilateral;   THYROIDECTOMY     total for benign tumor, Parathyroid spared   TONSILLECTOMY     TOTAL HIP ARTHROPLASTY Right 2006   secondary to congenital  hip defect    Current Medications: No outpatient medications have been marked as taking for the 01/01/23 encounter (Appointment) with O'Neal, Ronnald Ramp, MD.     Allergies:    Bee venom, Accupril [quinapril hcl], Quinapril-hydrochlorothiazide, Simvastatin, and Rosuvastatin   Social History: Social History   Socioeconomic History   Marital status: Married    Spouse name: Not on file   Number of children: 3   Years of education: Not on file   Highest education level: Not on  file  Occupational History   Occupation: Tree surgeon   Occupation: Careers information officer   Occupation: Retired Education administrator  Tobacco Use   Smoking status: Former    Packs/day: 0.50    Years: 5.00    Additional pack years: 0.00    Total pack years: 2.50    Types: Cigarettes    Quit date: 07/25/1971    Years since quitting: 51.4    Passive exposure: Past   Smokeless tobacco: Never  Vaping Use   Vaping Use: Never used  Substance and Sexual Activity   Alcohol use: Yes    Alcohol/week: 7.0 standard drinks of alcohol    Types: 7 Glasses of wine per week   Drug use: No   Sexual activity: Never    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  Other Topics Concern   Not on file  Social History Narrative   Not on file    Social Determinants of Health   Financial Resource Strain: Low Risk  (09/13/2022)   Overall Financial Resource Strain (CARDIA)    Difficulty of Paying Living Expenses: Not very hard  Food Insecurity: No Food Insecurity (09/13/2022)   Hunger Vital Sign    Worried About Running Out of Food in the Last Year: Never true    Ran Out of Food in the Last Year: Never true  Transportation Needs: No Transportation Needs (09/13/2022)   PRAPARE - Administrator, Civil Service (Medical): No    Lack of Transportation (Non-Medical): No  Physical Activity: Inactive (09/13/2022)   Exercise Vital Sign    Days of Exercise per Week: 0 days    Minutes of Exercise per Session: 0 min  Stress: No Stress Concern Present (09/13/2022)   [employer] of Occupational Health - Occupational Stress Questionnaire    Feeling of Stress : Only a little  Social Connections: Socially Integrated (09/13/2022)   Social Connection and Isolation Panel [NHANES]    Frequency of Communication with Friends and Family: Twice a week    Frequency of Social Gatherings with Friends and Family: Once a week    Attends Religious Services: More than 4 times per year    Active Member of [employer] or Organizations: Yes    Attends Engineer, structural: More than 4 times per year    Marital Status: Married     Family History: The patient's ***family history includes Aneurysm in her paternal grandfather; Anuerysm in her father; Anxiety disorder in her mother; Cancer in her mother; Colon cancer in her maternal grandmother; Hearing loss in her mother; Heart Problems in her mother and sister; Heart attack in her maternal grandfather; Heart disease in her paternal grandfather; Heart failure in her mother; Hyperlipidemia in her father and sister; Hypertension in her sister; Lung cancer (age of onset: 59) in her mother; Migraines in her sister; Obesity in her mother; Osteoarthritis in her father; Sudden death in her father.  ROS:    All other ROS reviewed and negative. Pertinent positives noted in the HPI.     EKGs/Labs/Other Studies Reviewed:   The following studies were personally reviewed by me today:  EKG:  EKG is *** ordered today.  The ekg ordered today demonstrates ***, and was personally reviewed by me.   Recent Labs: 10/31/2022: TSH 0.51 12/04/2022: ALT 18; BUN 21; Creatinine, Ser 0.71; Potassium 3.9; Sodium 138 12/28/2022: Hemoglobin 12.8; Platelets 335.0   Recent Lipid Panel    Component Value Date/Time   CHOL 166 10/31/2022 1109   CHOL 200 (H)  01/26/2021 0934   TRIG 71.0 10/31/2022 1109   HDL 53.30 10/31/2022 1109   HDL 83 01/26/2021 0934   CHOLHDL 3 10/31/2022 1109   VLDL 14.2 10/31/2022 1109   LDLCALC 99 10/31/2022 1109   LDLCALC 105 (H) 01/26/2021 0934    Physical Exam:   VS:  There were no vitals taken for this visit.   Wt Readings from Last 3 Encounters:  10/31/22 209 lb (94.8 kg)  09/21/22 219 lb 9.6 oz (99.6 kg)  09/13/22 219 lb (99.3 kg)    General: Well nourished, well developed, in no acute distress Head: Atraumatic, normal size  Eyes: PEERLA, EOMI  Neck: Supple, no JVD Endocrine: No thryomegaly Cardiac: Normal S1, S2; RRR; no murmurs, rubs, or gallops Lungs: Clear to auscultation bilaterally, no wheezing, rhonchi or rales  Abd: Soft, nontender, no hepatomegaly  Ext: No edema, pulses 2+ Musculoskeletal: No deformities, BUE and BLE strength normal and equal Skin: Warm and dry, no rashes   Neuro: Alert and oriented to person, place, time, and situation, CNII-XII grossly intact, no focal deficits  Psych: Normal mood and affect   ASSESSMENT:   Rebecca Brooks is a 72 y.o. female who presents for the following: No diagnosis found.  PLAN:   There are no diagnoses linked to this encounter.  {Are you ordering a CV Procedure (e.g. stress test, cath, DCCV, TEE, etc)?   Press F2        :[id number]}  Disposition: No follow-ups on file.  Medication Adjustments/Labs and Tests  Ordered: Current medicines are reviewed at length with the patient today.  Concerns regarding medicines are outlined above.  No orders of the defined types were placed in this encounter.  No orders of the defined types were placed in this encounter.   There are no Patient Instructions on file for this visit.   Time Spent with Patient: I have spent a total of *** minutes with patient reviewing hospital notes, telemetry, EKGs, labs and examining the patient as well as establishing an assessment and plan that was discussed with the patient.  > 50% of time was spent in direct patient care.  Signed, Lenna Gilford. Flora Lipps, MD, [facility]  [facility]  [address], [address] [place], [place] [address] [phone]  12/31/2022 7:39 PM

## 2023-01-01 ENCOUNTER — Ambulatory Visit: Payer: Medicare Other | Attending: Cardiovascular Disease | Admitting: Cardiovascular Disease

## 2023-01-01 ENCOUNTER — Encounter: Payer: Self-pay | Admitting: Cardiovascular Disease

## 2023-01-01 VITALS — BP 132/78 | HR 57 | Ht 62.0 in | Wt 197.8 lb

## 2023-01-01 DIAGNOSIS — I10 Essential (primary) hypertension: Secondary | ICD-10-CM

## 2023-01-01 DIAGNOSIS — I480 Paroxysmal atrial fibrillation: Secondary | ICD-10-CM | POA: Diagnosis not present

## 2023-01-01 DIAGNOSIS — Z01810 Encounter for preprocedural cardiovascular examination: Secondary | ICD-10-CM

## 2023-01-01 DIAGNOSIS — E782 Mixed hyperlipidemia: Secondary | ICD-10-CM

## 2023-01-01 NOTE — Patient Instructions (Signed)
Medication Instructions:  You may hold the Eliquis 2 days before procedure, and restart based on surgeon recommendation.   *If you need a refill on your cardiac medications before your next appointment, please call your pharmacy*   Follow-Up: At [facility], you and your health needs are our priority.  As part of our continuing mission to provide you with exceptional heart care, we have created designated Provider Care Teams.  These Care Teams include your primary Cardiologist (physician) and Advanced Practice Providers (APPs -  Physician Assistants and Nurse Practitioners) who all work together to provide you with the care you need, when you need it.  We recommend signing up for the patient portal called "MyChart".  Sign up information is provided on this After Visit Summary.  MyChart is used to connect with patients for Virtual Visits (Telemedicine).  Patients are able to view lab/test results, encounter notes, upcoming appointments, etc.  Non-urgent messages can be sent to your provider as well.   To learn more about what you can do with MyChart, go to [email or web address].    Your next appointment:   12 month(s)  Provider:   Reatha Harps, MD

## 2023-01-01 NOTE — Telephone Encounter (Signed)
Called pt spoke with her regarding Xray, that they are behind reading Test. Pt was advised we will let her know as soon as we get report.

## 2023-01-03 DIAGNOSIS — M1712 Unilateral primary osteoarthritis, left knee: Secondary | ICD-10-CM | POA: Diagnosis not present

## 2023-01-04 ENCOUNTER — Telehealth: Payer: Self-pay

## 2023-01-04 ENCOUNTER — Encounter: Payer: Self-pay | Admitting: Family Medicine

## 2023-01-04 NOTE — Telephone Encounter (Signed)
Patient will like to know providers comments on abdominal US from earlier this month.  Patient has knee surgery scheduled and said medical clearance will be sent in to be filled out. She was cleared by cardiology.  Please advised if ov needed, surgery is scheduled for 7/01

## 2023-01-04 NOTE — H&P (Signed)
TOTAL KNEE ADMISSION H&P  Patient is being admitted for left total knee arthroplasty.  Subjective:  Chief Complaint: Left knee pain.  HPI: Rebecca Brooks, 72 y.o. female has a history of pain and functional disability in the left knee due to arthritis and has failed non-surgical conservative treatments for greater than 12 weeks to include NSAID's and/or analgesics, corticosteriod injections, and activity modification. Onset of symptoms was gradual, starting 4 years ago with gradually worsening course since that time. The patient noted no past surgery on the left knee.  Patient currently rates pain in the left knee at 7 out of 10 with activity. Patient has worsening of pain with activity and weight bearing, pain that interferes with activities of daily living, and crepitus. Patient has evidence of subchondral cysts, subchondral sclerosis, and joint space narrowing by imaging studies. There is no active infection.  Patient Active Problem List   Diagnosis Date Noted   Osteopenia 03/29/2022   Gout 03/29/2022   Murmur, cardiac 03/29/2022   Foot pain 03/29/2022   Lumbar stenosis with neurogenic claudication 10/21/2021   Depression 02/23/2021   Hyperglycemia 02/16/2020   History of COVID-19 10/16/2018   Atrophic vaginitis 05/14/2018   OSA (obstructive sleep apnea) 04/26/2018   Muscle cramps 01/02/2018   IDA (iron deficiency anemia) 12/22/2016   Primary osteoarthritis of both knees 10/03/2016   DDD (degenerative disc disease), cervical 10/03/2016   DDD (degenerative disc disease), lumbar 10/03/2016   History of total hip replacement, right 10/03/2016   History of anemia 10/03/2016   Chondromalacia 10/03/2016   Primary osteoarthritis of shoulder 10/03/2016   Primary osteoarthritis of both hands 10/03/2016   Abnormal nuclear stress test 08/23/2015   Globus sensation 08/13/2015   Encounter for screening mammogram for malignant neoplasm of breast 08/13/2015   Chest pain at rest    Chest  pain with moderate risk of acute coronary syndrome 07/28/2015   Annual physical exam 07/11/2015   RLS (restless legs syndrome) 11/01/2014   Insomnia 11/01/2014   Thoracic back pain 01/12/2014   Allergic rhinitis 11/03/2013   Preventative health care 12/13/2012   Recurrent epistaxis 02/20/2012   Shoulder pain, bilateral 11/01/2011   Arthralgia 10/18/2011   Peripheral edema 09/10/2011   Asthma 08/19/2011   Palpitations 08/01/2011   Anemia 08/01/2011   Hypokalemia 08/01/2011   Elevated WBC count 08/01/2011   GERD (gastroesophageal reflux disease)    Low back pain radiating to right leg    Obesity (BMI 30-39.9) 07/17/2011   Hypothyroidism 07/17/2011   Hyperlipidemia 07/17/2011   Essential hypertension 07/17/2011    Past Medical History:  Diagnosis Date   Abnormal nuclear stress test    False positive. Cath 08/23/15 showed Angiographically normal coronary arteries   Anemia    Ankle fracture    Arthritis    Asthma    "asthmatic bronchitis"   Atrial fibrillation (HCC)    Back pain    Chicken pox as a child   Edema of both lower extremities    Essential hypertension 07/17/2011   ACEI d/c 06/2011 for refractory cough> resolved    GERD (gastroesophageal reflux disease)    Hyperlipidemia    Hypertension    Hypothyroidism    Insomnia 11/01/2014   Insulin resistance    pt reports she was told this in the past, does not take any medication for this   Joint pain    Low back pain radiating to right leg    intermittent and occasional numbness of skin over right hip in certain positions  Measles as a child   Mumps as a child   Obesity    Osteoarthritis    Palpitation    Pneumonia    this October 2013 from which she has said inhailer   PONV (postoperative nausea and vomiting)    "violent vomiting after surgery"   Recurrent epistaxis 02/20/2012   RLS (restless legs syndrome) 11/01/2014   Skin cancer    Sleep apnea    SOB (shortness of breath) on exertion    Spinal stenosis     Vitamin B 12 deficiency    Vitamin D deficiency     Past Surgical History:  Procedure Laterality Date   CARDIAC CATHETERIZATION N/A 08/23/2015   Procedure: Left Heart Cath and Coronary Angiography;  Surgeon: Tonny Bollman, MD;  Location[facility] INVASIVE CV LAB;  Service: Cardiovascular;  Laterality: N/A;   CESAREAN SECTION     X 3   GASTRECTOMY  12/2017   INGUINAL HERNIA REPAIR Right 72 yrs old   LUMBAR LAMINECTOMY/DECOMPRESSION MICRODISCECTOMY Bilateral 10/21/2021   Procedure: Laminectomy and Foraminotomy - bilateral - Lumbar Three-Lumbar Four - Lumbar Four- Lumbar Five;  Surgeon: Julio Sicks, MD;  Location: [facility] OR;  Service: Neurosurgery;  Laterality: Bilateral;   THYROIDECTOMY     total for benign tumor, Parathyroid spared   TONSILLECTOMY     TOTAL HIP ARTHROPLASTY Right 2006   secondary to congenital  hip defect    Prior to Admission medications   Medication Sig Start Date End Date Taking? Authorizing Provider  acetaminophen (TYLENOL) 500 MG tablet Take 1,000 mg by mouth in the morning and at bedtime.    [provider]  albuterol (VENTOLIN HFA) 108 (90 Base) MCG/ACT inhaler Inhale 1-2 puffs into the lungs every 6 (six) hours as needed for wheezing or shortness of breath. 08/21/22   Sandford Craze, NP  apixaban (ELIQUIS) 5 MG TABS tablet Take 5 mg by mouth 2 (two) times daily.    [provider]  benzonatate (TESSALON) 200 MG capsule Take 1 capsule (200 mg total) by mouth 3 (three) times daily as needed for cough. 04/17/22   Margaretann Loveless, PA-C  cetirizine (ZYRTEC) 10 MG tablet TAKE 1 TABLET(10 MG) BY MOUTH DAILY 08/17/22   Bradd Canary, MD  cholestyramine (QUESTRAN) 4 g packet Take 1 packet (4 g total) by mouth 2 (two) times daily. 12/27/22   Bradd Canary, MD  cyclobenzaprine (FLEXERIL) 10 MG tablet Take 1 tablet (10 mg total) by mouth 3 (three) times daily as needed for muscle spasms. 10/22/21   Dawley, Troy C, DO  diclofenac Sodium (VOLTAREN) 1 % GEL  Apply 2-4 g topically 4 (four) times daily as needed. Patient taking differently: Apply 2-4 g topically 4 (four) times daily as needed (for arthritis). 02/17/22   Bradd Canary, MD  diltiazem (CARDIZEM CD) 240 MG 24 hr capsule Take 1 capsule (240 mg total) by mouth daily. 05/03/22   Jonita Albee, PA-C  famotidine (PEPCID) 20 MG tablet TAKE 1 TABLET(20 MG) BY MOUTH AT BEDTIME 10/30/22   Bradd Canary, MD  fluticasone [facility]) 50 MCG/ACT nasal spray SHAKE LIQUID AND USE 2 SPRAYS IN EACH NOSTRIL AT BEDTIME 10/27/22   Bradd Canary, MD  furosemide (LASIX) 20 MG tablet Take 1 tablet (20 mg total) by mouth daily as needed for fluid or edema (weight gain > 3#/24 hr). 12/20/21   Bradd Canary, MD  levothyroxine (SYNTHROID) 100 MCG tablet TAKE 1 TABLET(100 MCG) BY MOUTH DAILY BEFORE BREAKFAST 10/30/22  Bradd Canary, MD  metoprolol tartrate (LOPRESSOR) 50 MG tablet TAKE 1 TABLET(50 MG) BY MOUTH TWICE DAILY 07/10/22   O'Neal, Ronnald Ramp, MD  pantoprazole (PROTONIX) 40 MG tablet TAKE 1 TABLET(40 MG) BY MOUTH DAILY 08/01/22   Bradd Canary, MD  rOPINIRole (REQUIP) 2 MG tablet Take 1 tablet (2 mg total) by mouth at bedtime. 10/31/22   Bradd Canary, MD  traMADol (ULTRAM) 50 MG tablet Take 1 tablet (50 mg total) by mouth 3 (three) times daily as needed for moderate pain. 12/28/22   Bradd Canary, MD  Vitamin D3 (VITAMIN D) 25 MCG tablet Take 1 tablet (1,000 Units total) by mouth daily. 01/27/22   Bradd Canary, MD  zolpidem (AMBIEN) 10 MG tablet Take 1 tablet (10 mg total) by mouth at bedtime as needed for sleep. 10/31/22   Bradd Canary, MD    Allergies  Allergen Reactions   Bee Venom Anaphylaxis   Accupril [Quinapril Hcl] Cough   Quinapril-Hydrochlorothiazide Cough   Simvastatin Other (See Comments)    myalgias   Rosuvastatin Other (See Comments)    Leg aches.    Social History   Socioeconomic History   Marital status: Married    Spouse name: Not on file   Number of children: 3    Years of education: Not on file   Highest education level: Not on file  Occupational History   Occupation: Tree surgeon   Occupation: Careers information officer   Occupation: Retired Education administrator  Tobacco Use   Smoking status: Former    Packs/day: 0.50    Years: 5.00    Additional pack years: 0.00    Total pack years: 2.50    Types: Cigarettes    Quit date: 07/25/1971    Years since quitting: 51.4    Passive exposure: Past   Smokeless tobacco: Never  Vaping Use   Vaping Use: Never used  Substance and Sexual Activity   Alcohol use: Yes    Alcohol/week: 7.0 standard drinks of alcohol    Types: 7 Glasses of wine per week   Drug use: No   Sexual activity: Never    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  Other Topics Concern   Not on file  Social History Narrative   Not on file   Social Determinants of Health   Financial Resource Strain: Low Risk  (09/13/2022)   Overall Financial Resource Strain (CARDIA)    Difficulty of Paying Living Expenses: Not very hard  Food Insecurity: No Food Insecurity (09/13/2022)   Hunger Vital Sign    Worried About Running Out of Food in the Last Year: Never true    Ran Out of Food in the Last Year: Never true  Transportation Needs: No Transportation Needs (09/13/2022)   PRAPARE - Administrator, Civil Service (Medical): No    Lack of Transportation (Non-Medical): No  Physical Activity: Inactive (09/13/2022)   Exercise Vital Sign    Days of Exercise per Week: 0 days    Minutes of Exercise per Session: 0 min  Stress: No Stress Concern Present (09/13/2022)   [employer] of Occupational Health - Occupational Stress Questionnaire    Feeling of Stress : Only a little  Social Connections: Socially Integrated (09/13/2022)   Social Connection and Isolation Panel [NHANES]    Frequency of Communication with Friends and Family: Twice a week    Frequency of Social Gatherings with Friends and Family: Once a week    Attends Religious Services: More  than 4 times per year    Active Member of Clubs or Organizations: Yes    Attends Banker Meetings: More than 4 times per year    Marital Status: Married  Catering manager Violence: Not At Risk (09/13/2022)   Humiliation, Afraid, Rape, and Kick questionnaire    Fear of Current or Ex-Partner: No    Emotionally Abused: No    Physically Abused: No    Sexually Abused: No    Tobacco Use: Medium Risk (01/01/2023)   Patient History    Smoking Tobacco Use: Former    Smokeless Tobacco Use: Never    Passive Exposure: Past   Social History   Substance and Sexual Activity  Alcohol Use Yes   Alcohol/week: 7.0 standard drinks of alcohol   Types: 7 Glasses of wine per week    Family History  Problem Relation Age of Onset   Cancer Mother    Heart failure Mother    Lung cancer Mother 16       smoker   Heart Problems Mother        tachycardia   Hearing loss Mother    Anxiety disorder Mother        anxiety, claustrophobia   Obesity Mother    Osteoarthritis Father    Hyperlipidemia Father    Anuerysm Father        AAA   Sudden death Father    Hyperlipidemia Sister    Migraines Sister    Hypertension Sister    Heart Problems Sister        tachycardia   Colon cancer Maternal Grandmother    Heart attack Maternal Grandfather    Aneurysm Paternal Grandfather        abdominal   Heart disease Paternal Grandfather        AAA rupture, smoker    ROS  Objective:  Physical Exam: Evaluation of the left hip shows flexion to 120 rotation in 30 out 40 and abduction 40 without discomfort.    - Right knee No effusion. Range of motion is 0-125 with moderate crepitus on range of motion. Tender medially. No lateral tenderness or instability.    - Left knee No effusion. Valgus deformity present. Range of motion is about 5-125 with crepitus on range of motion. Tender laterally greater than medially. Slight varus-valgus laxity in extension and AP laxity at 90 degreees of flexion     IMAGING:  Radiographs (AP both knees and lateral) taken today demonstrate severe end-stage osteoarthritis of both knees. On the right side, there are tricompartmental bone-on-bone changes. On the left side, there are lateral and patellofemoral changes with bone-on-bone valgus deformity.  Assessment/Plan:  End stage arthritis, left knee   The patient history, physical examination, clinical judgment of the provider and imaging studies are consistent with end stage degenerative joint disease of the left knee and total knee arthroplasty is deemed medically necessary. The treatment options including medical management, injection therapy arthroscopy and arthroplasty were discussed at length. The risks and benefits of total knee arthroplasty were presented and reviewed. The risks due to aseptic loosening, infection, stiffness, patella tracking problems, thromboembolic complications and other imponderables were discussed. The patient acknowledged the explanation, agreed to proceed with the plan and consent was signed. Patient is being admitted for inpatient treatment for surgery, pain control, PT, OT, prophylactic antibiotics, VTE prophylaxis, progressive ambulation and ADLs and discharge planning. The patient is planning to be discharged  home with OPPT scheduled .   Patient's anticipated LOS is less  than 2 midnights, meeting these requirements: - Younger than 42 - Lives within 1 hour of care - Has a competent adult at home to recover with post-op recover - NO history of  - Chronic pain requiring opiods  - Diabetes  - Coronary Artery Disease  - Heart failure  - Heart attack  - Stroke  - DVT/VTE  - Respiratory Failure/COPD  - Renal failure  - Anemia  - Advanced Liver disease  Therapy Plans: EO  Disposition: Home with sister in law. Lives with husband who has medical problems Planned DVT Prophylaxis: Eliquis (hx paroxysmal A fib) DME Needed: RW ordered PCP: Danise Edge, MD (clearance  pending) Cardiologist: Dr. Flora Lipps (clearance in Epic) TXA: IV Allergies: accupril (cough), bee venom (anaphylaxis), statins (muscle cramps) Anesthesia Concerns: Severe vomiting BMI: 36  Pharmacy: [facility] ([address] and [address])  Other: -Hold Eliquis for 48 hours prior to surgery, per cardiology  - Patient was instructed on what medications to stop prior to surgery. - Follow-up visit in 2 weeks with Dr. Lequita Halt - Begin physical therapy following surgery - Pre-operative lab work as pre-surgical testing - Prescriptions will be provided in hospital at time of discharge  Weston Brass, PA-C Orthopedic Surgery [facility] [place]

## 2023-01-05 ENCOUNTER — Other Ambulatory Visit: Payer: Self-pay | Admitting: Family Medicine

## 2023-01-05 DIAGNOSIS — R197 Diarrhea, unspecified: Secondary | ICD-10-CM

## 2023-01-05 DIAGNOSIS — K5900 Constipation, unspecified: Secondary | ICD-10-CM

## 2023-01-05 DIAGNOSIS — N2889 Other specified disorders of kidney and ureter: Secondary | ICD-10-CM

## 2023-01-05 DIAGNOSIS — R109 Unspecified abdominal pain: Secondary | ICD-10-CM

## 2023-01-05 NOTE — Telephone Encounter (Signed)
Patient reports she is not having abdominal pain. She will call back next week to make sure the clearance from ortho has been received.

## 2023-01-05 NOTE — Telephone Encounter (Signed)
See telephone note w/ Windell Moulding. Closing this message.

## 2023-01-08 ENCOUNTER — Encounter: Payer: Self-pay | Admitting: Nurse Practitioner

## 2023-01-08 ENCOUNTER — Ambulatory Visit (HOSPITAL_BASED_OUTPATIENT_CLINIC_OR_DEPARTMENT_OTHER)
Admission: RE | Admit: 2023-01-08 | Discharge: 2023-01-08 | Disposition: A | Discharge status: Home / Self Care | Payer: Medicare Other | Source: Ambulatory Visit | Attending: Family Medicine | Admitting: Family Medicine

## 2023-01-08 ENCOUNTER — Encounter: Payer: Self-pay | Admitting: Family Medicine

## 2023-01-08 DIAGNOSIS — K5900 Constipation, unspecified: Secondary | ICD-10-CM | POA: Diagnosis not present

## 2023-01-08 DIAGNOSIS — R197 Diarrhea, unspecified: Secondary | ICD-10-CM | POA: Diagnosis not present

## 2023-01-08 DIAGNOSIS — N281 Cyst of kidney, acquired: Secondary | ICD-10-CM | POA: Diagnosis not present

## 2023-01-08 DIAGNOSIS — K5730 Diverticulosis of large intestine without perforation or abscess without bleeding: Secondary | ICD-10-CM | POA: Diagnosis not present

## 2023-01-08 DIAGNOSIS — N2889 Other specified disorders of kidney and ureter: Secondary | ICD-10-CM | POA: Diagnosis not present

## 2023-01-08 DIAGNOSIS — R109 Unspecified abdominal pain: Secondary | ICD-10-CM | POA: Insufficient documentation

## 2023-01-08 MED ORDER — IOHEXOL 300 MG/ML  SOLN
100.0000 mL | Freq: Once | INTRAMUSCULAR | Status: AC | PRN
  Administered 2023-01-08: 100 mL via INTRAVENOUS

## 2023-01-08 NOTE — Patient Instructions (Signed)
SURGICAL WAITING ROOM VISITATION  Patients having surgery or a procedure may have no more than 2 support people in the waiting area - these visitors may rotate.    Children under the age of 33 must have an adult with them who is not the patient.  Due to an increase in RSV and influenza rates and associated hospitalizations, children ages 59 and under may not visit patients in [facility] hospitals.  If the patient needs to stay at the hospital during part of their recovery, the visitor guidelines for inpatient rooms apply. Pre-op nurse will coordinate an appropriate time for 1 support person to accompany patient in pre-op.  This support person may not rotate.    Please refer to the [facility] website for the visitor guidelines for Inpatients (after your surgery is over and you are in a regular room).       Your procedure is scheduled on:  01/22/2023    Report to [facility] Main Entrance    Report to admitting at  0800 AM   Call this number if you have problems the morning of surgery [phone]   Do not eat food :After Midnight.   After Midnight you may have the following liquids until ___ 0730___ AM DAY OF SURGERY  Water Non-Citrus Juices (without pulp, NO RED-Apple, White grape, White cranberry) Black Coffee (NO MILK/CREAM OR CREAMERS, sugar ok)  Clear Tea (NO MILK/CREAM OR CREAMERS, sugar ok) regular and decaf                             Plain Jell-O (NO RED)                                           Fruit ices (not with fruit pulp, NO RED)                                     Popsicles (NO RED)                                                               Sports drinks like Gatorade (NO RED)                      The day of surgery:  Drink ONE (1) Pre-Surgery Clear Ensure or G2 at   0730AM ( have completed by )  the morning of surgery. Drink in one sitting. Do not sip.  This drink was given to you during your hospital  pre-op appointment visit. Nothing else to  drink after completing the  Pre-Surgery Clear Ensure or G2.          If you have questions, please contact your surgeon's office.      Oral Hygiene is also important to reduce your risk of infection.                                    Remember - BRUSH YOUR TEETH THE MORNING OF SURGERY  WITH YOUR REGULAR TOOTHPASTE  DENTURES WILL BE REMOVED PRIOR TO SURGERY PLEASE DO NOT APPLY "Poly grip" OR ADHESIVES!!!   Do NOT smoke after Midnight   Take these medicines the morning of surgery with A SIP OF WATER:  inhalers as usual and bring, zyrtec, cardizem protonix, synthroid, metoprolol   DO NOT TAKE ANY ORAL DIABETIC MEDICATIONS DAY OF YOUR SURGERY  Bring CPAP mask and tubing day of surgery.                              You may not have any metal on your body including hair pins, jewelry, and body piercing             Do not wear make-up, lotions, powders, perfumes/cologne, or deodorant  Do not wear nail polish including gel and S&S, artificial/acrylic nails, or any other type of covering on natural nails including finger and toenails. If you have artificial nails, gel coating, etc. that needs to be removed by a nail salon please have this removed prior to surgery or surgery may need to be canceled/ delayed if the surgeon/ anesthesia feels like they are unable to be safely monitored.   Do not shave  48 hours prior to surgery.               Men may shave face and neck.   Do not bring valuables to the hospital. [facility] IS NOT             RESPONSIBLE   FOR VALUABLES.   Contacts, glasses, dentures or bridgework may not be worn into surgery.   Bring small overnight bag day of surgery.   DO NOT BRING YOUR HOME MEDICATIONS TO THE HOSPITAL. PHARMACY WILL DISPENSE MEDICATIONS LISTED ON YOUR MEDICATION LIST TO YOU DURING YOUR ADMISSION IN THE HOSPITAL!    Patients discharged on the day of surgery will not be allowed to drive home.  Someone NEEDS to stay with you for the first 24 hours after  anesthesia.   Special Instructions: Bring a copy of your healthcare power of attorney and living will documents the day of surgery if you haven't scanned them before.              Please read over the following fact sheets you were given: IF YOU HAVE QUESTIONS ABOUT YOUR PRE-OP INSTRUCTIONS PLEASE CALL [phone]   If you received a COVID test during your pre-op visit  it is requested that you wear a mask when out in public, stay away from anyone that may not be feeling well and notify your surgeon if you develop symptoms. If you test positive for Covid or have been in contact with anyone that has tested positive in the last 10 days please notify you surgeon.      Pre-operative 5 CHG Bath Instructions   You can play a key role in reducing the risk of infection after surgery. Your skin needs to be as free of germs as possible. You can reduce the number of germs on your skin by washing with CHG (chlorhexidine gluconate) soap before surgery. CHG is an antiseptic soap that kills germs and continues to kill germs even after washing.   DO NOT use if you have an allergy to chlorhexidine/CHG or antibacterial soaps. If your skin becomes reddened or irritated, stop using the CHG and notify one of our RNs at [phone].   Please shower with the CHG soap starting 4 days  before surgery using the following schedule:     Please keep in mind the following:  DO NOT shave, including legs and underarms, starting the day of your first shower.   You may shave your face at any point before/day of surgery.  Place clean sheets on your bed the day you start using CHG soap. Use a clean washcloth (not used since being washed) for each shower. DO NOT sleep with pets once you start using the CHG.   CHG Shower Instructions:  If you choose to wash your hair and private area, wash first with your normal shampoo/soap.  After you use shampoo/soap, rinse your hair and body thoroughly to remove shampoo/soap residue.   Turn the water OFF and apply about 3 tablespoons (45 ml) of CHG soap to a CLEAN washcloth.  Apply CHG soap ONLY FROM YOUR NECK DOWN TO YOUR TOES (washing for 3-5 minutes)  DO NOT use CHG soap on face, private areas, open wounds, or sores.  Pay special attention to the area where your surgery is being performed.  If you are having back surgery, having someone wash your back for you may be helpful. Wait 2 minutes after CHG soap is applied, then you may rinse off the CHG soap.  Pat dry with a clean towel  Put on clean clothes/pajamas   If you choose to wear lotion, please use ONLY the CHG-compatible lotions on the back of this paper.     Additional instructions for the day of surgery: DO NOT APPLY any lotions, deodorants, cologne, or perfumes.   Put on clean/comfortable clothes.  Brush your teeth.  Ask your nurse before applying any prescription medications to the skin.      CHG Compatible Lotions   Aveeno Moisturizing lotion  Cetaphil Moisturizing Cream  Cetaphil Moisturizing Lotion  Clairol Herbal Essence Moisturizing Lotion, Dry Skin  Clairol Herbal Essence Moisturizing Lotion, Extra Dry Skin  Clairol Herbal Essence Moisturizing Lotion, Normal Skin  Curel Age Defying Therapeutic Moisturizing Lotion with Alpha Hydroxy  Curel Extreme Care Body Lotion  Curel Soothing Hands Moisturizing Hand Lotion  Curel Therapeutic Moisturizing Cream, Fragrance-Free  Curel Therapeutic Moisturizing Lotion, Fragrance-Free  Curel Therapeutic Moisturizing Lotion, Original Formula  Eucerin Daily Replenishing Lotion  Eucerin Dry Skin Therapy Plus Alpha Hydroxy Crme  Eucerin Dry Skin Therapy Plus Alpha Hydroxy Lotion  Eucerin Original Crme  Eucerin Original Lotion  Eucerin Plus Crme Eucerin Plus Lotion  Eucerin TriLipid Replenishing Lotion  Keri Anti-Bacterial Hand Lotion  Keri Deep Conditioning Original Lotion Dry Skin Formula Softly Scented  Keri Deep Conditioning Original Lotion, Fragrance  Free Sensitive Skin Formula  Keri Lotion Fast Absorbing Fragrance Free Sensitive Skin Formula  Keri Lotion Fast Absorbing Softly Scented Dry Skin Formula  Keri Original Lotion  Keri Skin Renewal Lotion Keri Silky Smooth Lotion  Keri Silky Smooth Sensitive Skin Lotion  Nivea Body Creamy Conditioning Oil  Nivea Body Extra Enriched Teacher, adult education Moisturizing Lotion Nivea Crme  Nivea Skin Firming Lotion  NutraDerm 30 Skin Lotion  NutraDerm Skin Lotion  NutraDerm Therapeutic Skin Cream  NutraDerm Therapeutic Skin Lotion  ProShield Protective Hand Cream  Provon moisturizing lotion

## 2023-01-08 NOTE — Telephone Encounter (Signed)
Called pt lvm if they could send [place] the paperwork for Surgery medical clearance.

## 2023-01-08 NOTE — Progress Notes (Signed)
Anesthesia Review:  PCP: Rebecca Brooks- LOV 11/10/22  Cardiologist : Rebecca Brooks LOV 01/01/23 - preop exam  Chest x-ray : 05/04/22- 2 view  EKG : 01/01/2023  Echo : 04/10/22  Stress test: 2017  Cardiac Cath :  2017  Activity level:  Sleep Study/ CPAP : Fasting Blood Sugar :      / Checks Blood Sugar -- times a day:   Blood Thinner/ Instructions /Last Dose: ASA / Instructions/ Last Dose :    Eliquis - last dose on 01/19/23    12/28/22- CBC

## 2023-01-09 NOTE — Telephone Encounter (Signed)
Yes Gwen that is correct, I spoke with pt also and she stated she had reached out to GI and for appt 03/19/23 Was the earliest time they had. Pt stated she will be following your orders for the GI recipe.

## 2023-01-10 ENCOUNTER — Other Ambulatory Visit: Payer: Self-pay | Admitting: Family Medicine

## 2023-01-10 ENCOUNTER — Other Ambulatory Visit: Payer: Self-pay

## 2023-01-10 ENCOUNTER — Encounter (HOSPITAL_COMMUNITY): Payer: Self-pay

## 2023-01-10 ENCOUNTER — Encounter (HOSPITAL_COMMUNITY)
Admission: RE | Admit: 2023-01-10 | Discharge: 2023-01-10 | Disposition: A | Discharge status: Home / Self Care | Payer: Medicare Other | Source: Ambulatory Visit | Attending: Orthopedic Surgery | Admitting: Orthopedic Surgery

## 2023-01-10 VITALS — BP 125/80 | HR 62 | Temp 97.9°F | Resp 16 | Ht 62.0 in | Wt 190.0 lb

## 2023-01-10 DIAGNOSIS — I4891 Unspecified atrial fibrillation: Secondary | ICD-10-CM | POA: Insufficient documentation

## 2023-01-10 DIAGNOSIS — K219 Gastro-esophageal reflux disease without esophagitis: Secondary | ICD-10-CM | POA: Diagnosis not present

## 2023-01-10 DIAGNOSIS — E039 Hypothyroidism, unspecified: Secondary | ICD-10-CM | POA: Insufficient documentation

## 2023-01-10 DIAGNOSIS — I10 Essential (primary) hypertension: Secondary | ICD-10-CM | POA: Diagnosis not present

## 2023-01-10 DIAGNOSIS — M1712 Unilateral primary osteoarthritis, left knee: Secondary | ICD-10-CM | POA: Insufficient documentation

## 2023-01-10 DIAGNOSIS — Z87891 Personal history of nicotine dependence: Secondary | ICD-10-CM | POA: Insufficient documentation

## 2023-01-10 DIAGNOSIS — R601 Generalized edema: Secondary | ICD-10-CM

## 2023-01-10 DIAGNOSIS — G4733 Obstructive sleep apnea (adult) (pediatric): Secondary | ICD-10-CM | POA: Diagnosis not present

## 2023-01-10 DIAGNOSIS — Z7901 Long term (current) use of anticoagulants: Secondary | ICD-10-CM | POA: Diagnosis not present

## 2023-01-10 DIAGNOSIS — Z01818 Encounter for other preprocedural examination: Secondary | ICD-10-CM

## 2023-01-10 LAB — BASIC METABOLIC PANEL
Anion gap: 6 (ref 5–15)
BUN: 28 mg/dL — ABNORMAL HIGH (ref 8–23)
CO2: 29 mmol/L (ref 22–32)
Calcium: 8.9 mg/dL (ref 8.9–10.3)
Chloride: 104 mmol/L (ref 98–111)
Creatinine, Ser: 0.63 mg/dL (ref 0.44–1.00)
GFR, Estimated: >60 mL/min (ref >60)
Glucose, Bld: 94 mg/dL (ref 70–99)
Potassium: 4 mmol/L (ref 3.5–5.1)
Sodium: 139 mmol/L (ref 135–145)

## 2023-01-10 LAB — CBC
HCT: 39.9 % (ref 36.0–46.0)
Hemoglobin: 12.9 g/dL (ref 12.0–15.0)
MCH: 31 pg (ref 26.0–34.0)
MCHC: 32.3 g/dL (ref 30.0–36.0)
MCV: 95.9 fL (ref 80.0–100.0)
Platelets: 323 10*3/uL (ref 150–400)
RBC: 4.16 MIL/uL (ref 3.87–5.11)
RDW: 13.1 % (ref 11.5–15.5)
WBC: 10.8 10*3/uL — ABNORMAL HIGH (ref 4.0–10.5)
nRBC: 0 % (ref 0.0–0.2)

## 2023-01-10 LAB — SURGICAL PCR SCREEN
MRSA, PCR: NEGATIVE
Staphylococcus aureus: POSITIVE — AB

## 2023-01-11 ENCOUNTER — Encounter (HOSPITAL_COMMUNITY): Admission: RE | Admit: 2023-01-11 | Payer: Medicare Other | Source: Ambulatory Visit

## 2023-01-11 NOTE — Progress Notes (Signed)
Case: [id number] Date/Time: 01/22/23 1015   Procedure: TOTAL KNEE ARTHROPLASTY (Left: Knee)   Anesthesia type: Choice   Pre-op diagnosis: left knee osteoarthritis   Location: [facility] ROOM 10 / [facility]   Surgeons: Ollen Gross, MD       DISCUSSION: Rebecca Brooks is a 72 yo female who presents to PAT prior to surgery above. PMH for former smoking, obesity, GERD, HTN, hypothyroidism, A.fib on Eliquis, OSA.  Prior anesthesia complications include PONV  Patient follows with cardiology for paroxysmal A-fib.  She last saw them in clinic on 01/01/2023.  She has been doing well from a cardiac standpoint and has good functional status.  Per Dr. Bufford Buttner at that visit:  "She will have knee surgery in the next few weeks.  RCRI equals 0 which equates to 0.4% risk of a major perioperative cardiovascular event.  This is very low risk.  Recent echo was normal.  Left heart cath in 2017 normal.  No recurrence of A-fib.  She may proceed to surgery at acceptable risk.  I have instructed her to hold her Eliquis 48 hours before surgery.  She will then restart after surgery at the discretion of her surgeon once hemostasis has been achieved."  Reached out to Dr. Bufford Buttner regarding Eliquis hold who stated it was okay to hold for 72 hours rather than 48.  Called patient to update her on when to hold this medication. She will take her AM dose on 6/28 at 8:30AM and hold the evening dose.  Patient follows with her PCP for all other chronic medical issues. Recently had a CT abdomen/pelvis due to constipation on 6/17 which did not show any acute process. BP is controlled.     VS: BP 125/80   Pulse 62   Temp 36.6 C (Oral)   Resp 16   Ht 5\' 2"  (1.575 m)   Wt 86.2 kg   SpO2 97%   BMI 34.75 kg/m   PROVIDERS: Bradd Canary, MD Cardiology: Gara Kroner  LABS: Labs reviewed: Acceptable for surgery. (all labs ordered are listed, but only abnormal results are displayed)  Labs Reviewed  SURGICAL PCR SCREEN -  Abnormal; Notable for the following components:      Result Value   Staphylococcus aureus POSITIVE (*)    All other components within normal limits  BASIC METABOLIC PANEL - Abnormal; Notable for the following components:   BUN 28 (*)    All other components within normal limits  CBC - Abnormal; Notable for the following components:   WBC 10.8 (*)    All other components within normal limits     IMAGES:  CT A/P 01/08/23:  IMPRESSION: 1. No acute intra-abdominal or pelvic pathology. 2. No suspicious renal lesion by CT. 3. Mild sigmoid diverticulosis. No bowel obstruction. Normal appendix. 4.  Aortic Atherosclerosis (ICD10-I70.0).   EKG 01/01/23:  Sinus bradycardia, rate 57   CV:  Echo 04/10/22:  IMPRESSIONS     1. Left ventricular ejection fraction, by estimation, is 60 to 65%. The  left ventricle has normal function. The left ventricle has no regional  wall motion abnormalities. Left ventricular diastolic parameters were  normal.   2. Right ventricular systolic function is normal. The right ventricular  size is normal. There is normal pulmonary artery systolic pressure.   3. Left atrial size was mildly dilated.   4. The mitral valve is normal in structure. No evidence of mitral valve  regurgitation. No evidence of mitral stenosis.   5. The aortic valve is  tricuspid. There is mild calcification of the  aortic valve. Aortic valve regurgitation is not visualized. Aortic valve  sclerosis is present, with no evidence of aortic valve stenosis.   6. The inferior vena cava is normal in size with greater than 50%  respiratory variability, suggesting right atrial pressure of 3 mmHg.  Left heart cath 08/23/2015:  Diagnostic Dominance: Right Left Main  The left main is patent without stenosis. It divides into the LAD and left circumflex.    Left Anterior Descending  The LAD is patent without stenosis. It reaches the LV apex. There are 2 diagonal branches, both medium in  caliber, without stenosis.    Left Circumflex  Vessel was injected. Vessel is normal in caliber. Vessel is angiographically normal. The circumflex is patent without stenosis. It supplies a single obtuse marginal branch without stenosis. This obtuse marginal branch divides into twin vessels distally.    Right Coronary Artery  . Vessel is angiographically normal. The RCA is a dominant vessel. It is patent throughout. There is no significant stenosis. The PDA and PLA branches are patent without stenosis.    Intervention   No interventions have been documented.     Past Medical History:  Diagnosis Date   Abnormal nuclear stress test    False positive. Cath 08/23/15 showed Angiographically normal coronary arteries   Anemia    Ankle fracture    Arthritis    Asthma    "asthmatic bronchitis"   Atrial fibrillation (HCC)    Back pain    Chicken pox as a child   Edema of both lower extremities    Essential hypertension 07/17/2011   ACEI d/c 06/2011 for refractory cough> resolved    GERD (gastroesophageal reflux disease)    Hyperlipidemia    Hypertension    Hypothyroidism    Insomnia 11/01/2014   Insulin resistance    pt reports she was told this in the past, does not take any medication for this   Joint pain    Low back pain radiating to right leg    intermittent and occasional numbness of skin over right hip in certain positions   Measles as a child   Mumps as a child   Obesity    Osteoarthritis    Palpitation    Pneumonia    this October 2013 from which she has said inhailer   PONV (postoperative nausea and vomiting)    "violent vomiting after surgery"   Recurrent epistaxis 02/20/2012   RLS (restless legs syndrome) 11/01/2014   Skin cancer    Sleep apnea    cpap   SOB (shortness of breath) on exertion    Spinal stenosis    Vitamin B 12 deficiency    Vitamin D deficiency     Past Surgical History:  Procedure Laterality Date   CARDIAC CATHETERIZATION N/A 08/23/2015    Procedure: Left Heart Cath and Coronary Angiography;  Surgeon: Tonny Bollman, MD;  Location: [facility] INVASIVE CV LAB;  Service: Cardiovascular;  Laterality: N/A;   CESAREAN SECTION     X 3   GASTRECTOMY  12/2017   INGUINAL HERNIA REPAIR Right 72 yrs old   LUMBAR LAMINECTOMY/DECOMPRESSION MICRODISCECTOMY Bilateral 10/21/2021   Procedure: Laminectomy and Foraminotomy - bilateral - Lumbar Three-Lumbar Four - Lumbar Four- Lumbar Five;  Surgeon: Julio Sicks, MD;  Location: [facility] OR;  Service: Neurosurgery;  Laterality: Bilateral;   THYROIDECTOMY     total for benign tumor, Parathyroid spared   TONSILLECTOMY     TOTAL HIP ARTHROPLASTY Right  2006   secondary to congenital  hip defect    MEDICATIONS:  acetaminophen (TYLENOL) 650 MG CR tablet   albuterol (VENTOLIN HFA) 108 (90 Base) MCG/ACT inhaler   apixaban (ELIQUIS) 5 MG TABS tablet   benzonatate (TESSALON) 200 MG capsule   cetirizine (ZYRTEC) 10 MG tablet   cholestyramine (QUESTRAN) 4 g packet   diclofenac Sodium (VOLTAREN) 1 % GEL   diltiazem (CARDIZEM CD) 240 MG 24 hr capsule   famotidine (PEPCID) 20 MG tablet   fluticasone (FLONASE) 50 MCG/ACT nasal spray   furosemide (LASIX) 20 MG tablet   ketorolac (ACULAR) 0.5 % ophthalmic solution   levothyroxine (SYNTHROID) 100 MCG tablet   metoprolol tartrate (LOPRESSOR) 50 MG tablet   pantoprazole (PROTONIX) 40 MG tablet   polyethylene glycol (MIRALAX / GLYCOLAX) 17 g packet   prednisoLONE acetate (PRED FORTE) 1 % ophthalmic suspension   rOPINIRole (REQUIP) 1 MG tablet   rOPINIRole (REQUIP) 2 MG tablet   traMADol (ULTRAM) 50 MG tablet   Vitamin D3 (VITAMIN D) 25 MCG tablet   zolpidem (AMBIEN) 10 MG tablet   No current facility-administered medications for this encounter.   Marcille Blanco [facility] Short Stay/Anesthesiology [facility] Phone [phone] 01/12/2023 9:04 AM

## 2023-01-12 NOTE — Anesthesia Preprocedure Evaluation (Addendum)
Anesthesia Evaluation  Patient identified by MRN, date of birth, ID band Patient awake    Reviewed: Allergy & Precautions, NPO status , Patient's Chart, lab work & pertinent test results, reviewed documented beta blocker date and time   History of Anesthesia Complications (+) PONV and history of anesthetic complications  Airway Mallampati: III  TM Distance: >3 FB Neck ROM: Full    Dental  (+) Teeth Intact, Dental Advisory Given   Pulmonary asthma , sleep apnea and Continuous Positive Airway Pressure Ventilation , former smoker   Pulmonary exam normal breath sounds clear to auscultation       Cardiovascular hypertension (128/65 preop), Pt. on medications and Pt. on home beta blockers Normal cardiovascular exam+ dysrhythmias (eliquis- LD: 6/28 but doesnt know what time- thinks it was around noon but very unsure) Atrial Fibrillation (-) pacemaker Rhythm:Regular Rate:Normal  Echo 03/2022  1. Left ventricular ejection fraction, by estimation, is 60 to 65%. The  left ventricle has normal function. The left ventricle has no regional  wall motion abnormalities. Left ventricular diastolic parameters were  normal.   2. Right ventricular systolic function is normal. The right ventricular  size is normal. There is normal pulmonary artery systolic pressure.   3. Left atrial size was mildly dilated.   4. The mitral valve is normal in structure. No evidence of mitral valve  regurgitation. No evidence of mitral stenosis.   5. The aortic valve is tricuspid. There is mild calcification of the  aortic valve. Aortic valve regurgitation is not visualized. Aortic valve  sclerosis is present, with no evidence of aortic valve stenosis.   6. The inferior vena cava is normal in size with greater than 50%  respiratory variability, suggesting right atrial pressure of 3 mmHg.    Left heart cath 08/23/2015:   Diagnostic Dominance: Right Left Main The left  main is patent without stenosis. It divides into the LAD and left circumflex.  Left Anterior Descending The LAD is patent without stenosis. It reaches the LV apex. There are 2 diagonal branches, both medium in caliber, without stenosis.  Left Circumflex Vessel was injected. Vessel is normal in caliber. Vessel is angiographically normal. The circumflex is patent without stenosis. It supplies a single obtuse marginal branch without stenosis. This obtuse marginal branch divides into twin vessels distally.  Right Coronary Artery . Vessel is angiographically normal. The RCA is a dominant vessel. It is patent throughout. There is no significant stenosis. The PDA and PLA branches are patent without stenosis.       Neuro/Psych  PSYCHIATRIC DISORDERS  Depression    negative neurological ROS     GI/Hepatic Neg liver ROS,GERD  Controlled,,  Endo/Other  Hypothyroidism    Renal/GU negative Renal ROS  negative genitourinary   Musculoskeletal  (+) Arthritis , Osteoarthritis,  L3-5 back surgery 2023    Abdominal   Peds  Hematology Hb 12.9, plt 323   Anesthesia Other Findings   Reproductive/Obstetrics negative OB ROS                             Anesthesia Physical Anesthesia Plan  ASA: 3  Anesthesia Plan: Regional and General   Post-op Pain Management: Regional block* and Ofirmev IV (intra-op)*   Induction:   PONV Risk Score and Plan: 2 and TIVA, Propofol infusion, Treatment may vary due to age or medical condition and Ondansetron  Airway Management Planned: Oral ETT  Additional Equipment: None  Intra-op Plan:  Post-operative Plan: Extubation in OR  Informed Consent: I have reviewed the patients History and Physical, chart, labs and discussed the procedure including the risks, benefits and alternatives for the proposed anesthesia with the patient or authorized representative who has indicated his/her understanding and acceptance.     Dental  advisory given  Plan Discussed with: CRNA  Anesthesia Plan Comments: (Pt very uncertain about whether she's taken blood thinners in the last 72h- will proceed w/ GA)        Anesthesia Quick Evaluation

## 2023-01-22 ENCOUNTER — Other Ambulatory Visit: Payer: Self-pay

## 2023-01-22 ENCOUNTER — Ambulatory Visit (HOSPITAL_COMMUNITY): Payer: Medicare Other | Admitting: Medical

## 2023-01-22 ENCOUNTER — Encounter (HOSPITAL_COMMUNITY)
Admission: RE | Disposition: A | Discharge status: Home / Self Care | Payer: Self-pay | Source: Home / Self Care | Attending: Orthopedic Surgery

## 2023-01-22 ENCOUNTER — Ambulatory Visit (HOSPITAL_BASED_OUTPATIENT_CLINIC_OR_DEPARTMENT_OTHER): Payer: Medicare Other | Admitting: Anesthesiology

## 2023-01-22 ENCOUNTER — Encounter (HOSPITAL_COMMUNITY): Payer: Self-pay | Admitting: Orthopedic Surgery

## 2023-01-22 ENCOUNTER — Observation Stay (HOSPITAL_COMMUNITY)
Admission: RE | Admit: 2023-01-22 | Discharge: 2023-01-23 | Disposition: A | Discharge status: Home / Self Care | Payer: Medicare Other | Attending: Orthopedic Surgery | Admitting: Orthopedic Surgery

## 2023-01-22 DIAGNOSIS — Z85828 Personal history of other malignant neoplasm of skin: Secondary | ICD-10-CM | POA: Insufficient documentation

## 2023-01-22 DIAGNOSIS — J45909 Unspecified asthma, uncomplicated: Secondary | ICD-10-CM | POA: Insufficient documentation

## 2023-01-22 DIAGNOSIS — E039 Hypothyroidism, unspecified: Secondary | ICD-10-CM | POA: Diagnosis not present

## 2023-01-22 DIAGNOSIS — I4891 Unspecified atrial fibrillation: Secondary | ICD-10-CM | POA: Diagnosis not present

## 2023-01-22 DIAGNOSIS — Z79899 Other long term (current) drug therapy: Secondary | ICD-10-CM | POA: Diagnosis not present

## 2023-01-22 DIAGNOSIS — G4733 Obstructive sleep apnea (adult) (pediatric): Secondary | ICD-10-CM | POA: Diagnosis not present

## 2023-01-22 DIAGNOSIS — Z8616 Personal history of COVID-19: Secondary | ICD-10-CM | POA: Diagnosis not present

## 2023-01-22 DIAGNOSIS — M1712 Unilateral primary osteoarthritis, left knee: Principal | ICD-10-CM

## 2023-01-22 DIAGNOSIS — I499 Cardiac arrhythmia, unspecified: Secondary | ICD-10-CM | POA: Diagnosis not present

## 2023-01-22 DIAGNOSIS — Z87891 Personal history of nicotine dependence: Secondary | ICD-10-CM

## 2023-01-22 DIAGNOSIS — Z7901 Long term (current) use of anticoagulants: Secondary | ICD-10-CM | POA: Diagnosis not present

## 2023-01-22 DIAGNOSIS — Z01818 Encounter for other preprocedural examination: Secondary | ICD-10-CM

## 2023-01-22 DIAGNOSIS — I10 Essential (primary) hypertension: Secondary | ICD-10-CM | POA: Diagnosis not present

## 2023-01-22 DIAGNOSIS — M179 Osteoarthritis of knee, unspecified: Secondary | ICD-10-CM

## 2023-01-22 DIAGNOSIS — Z96641 Presence of right artificial hip joint: Secondary | ICD-10-CM | POA: Insufficient documentation

## 2023-01-22 HISTORY — PX: TOTAL KNEE ARTHROPLASTY: SHX125

## 2023-01-22 SURGERY — ARTHROPLASTY, KNEE, TOTAL
Anesthesia: Monitor Anesthesia Care | Site: Knee | Laterality: Left

## 2023-01-22 MED ORDER — PREDNISOLONE ACETATE 1 % OP SUSP
1.0000 [drp] | Freq: Every day | OPHTHALMIC | Status: DC
  Administered 2023-01-22 – 2023-01-23 (×2): 1 [drp] via OPHTHALMIC
  Filled 2023-01-22: qty 5

## 2023-01-22 MED ORDER — FENTANYL CITRATE (PF) 250 MCG/5ML IJ SOLN
INTRAMUSCULAR | Status: DC | PRN
  Administered 2023-01-22 (×5): 50 ug via INTRAVENOUS

## 2023-01-22 MED ORDER — KETOROLAC TROMETHAMINE 0.5 % OP SOLN
1.0000 [drp] | Freq: Four times a day (QID) | OPHTHALMIC | Status: DC
  Administered 2023-01-22 – 2023-01-23 (×3): 1 [drp] via OPHTHALMIC
  Filled 2023-01-22: qty 5

## 2023-01-22 MED ORDER — ROPIVACAINE HCL 5 MG/ML IJ SOLN
INTRAMUSCULAR | Status: DC | PRN
  Administered 2023-01-22: 30 mL via PERINEURAL

## 2023-01-22 MED ORDER — DILTIAZEM HCL ER COATED BEADS 240 MG PO CP24
240.0000 mg | ORAL_CAPSULE | Freq: Every day | ORAL | Status: DC
  Administered 2023-01-23: 240 mg via ORAL
  Filled 2023-01-22: qty 1

## 2023-01-22 MED ORDER — HYDROMORPHONE HCL 1 MG/ML IJ SOLN
0.5000 mg | INTRAMUSCULAR | Status: DC | PRN
  Administered 2023-01-23: 0.5 mg via INTRAVENOUS
  Filled 2023-01-22: qty 1

## 2023-01-22 MED ORDER — METHOCARBAMOL 500 MG PO TABS
500.0000 mg | ORAL_TABLET | Freq: Four times a day (QID) | ORAL | Status: DC | PRN
  Administered 2023-01-22 – 2023-01-23 (×3): 500 mg via ORAL
  Filled 2023-01-22 (×3): qty 1

## 2023-01-22 MED ORDER — FENTANYL CITRATE PF 50 MCG/ML IJ SOSY
50.0000 ug | PREFILLED_SYRINGE | INTRAMUSCULAR | Status: AC
  Administered 2023-01-22: 50 ug via INTRAVENOUS
  Filled 2023-01-22: qty 2

## 2023-01-22 MED ORDER — POLYETHYLENE GLYCOL 3350 17 G PO PACK: 17.0000 g | PACK | Freq: Every day | ORAL | Status: DC | PRN

## 2023-01-22 MED ORDER — METOCLOPRAMIDE HCL 5 MG/ML IJ SOLN
5.0000 mg | Freq: Three times a day (TID) | INTRAMUSCULAR | Status: DC | PRN
  Administered 2023-01-22: 10 mg via INTRAVENOUS
  Filled 2023-01-22: qty 2

## 2023-01-22 MED ORDER — OXYCODONE HCL 5 MG/5ML PO SOLN: 5.0000 mg | Freq: Once | ORAL | Status: AC | PRN

## 2023-01-22 MED ORDER — ACETAMINOPHEN 500 MG PO TABS
1000.0000 mg | ORAL_TABLET | Freq: Once | ORAL | Status: AC
  Filled 2023-01-22: qty 2

## 2023-01-22 MED ORDER — PROPOFOL 10 MG/ML IV BOLUS
INTRAVENOUS | Status: AC
  Filled 2023-01-22: qty 20

## 2023-01-22 MED ORDER — STERILE WATER FOR IRRIGATION IR SOLN
Status: DC | PRN
  Administered 2023-01-22: 2000 mL

## 2023-01-22 MED ORDER — METOCLOPRAMIDE HCL 5 MG PO TABS: 5.0000 mg | ORAL_TABLET | Freq: Three times a day (TID) | ORAL | Status: DC | PRN

## 2023-01-22 MED ORDER — CEFAZOLIN SODIUM-DEXTROSE 2-4 GM/100ML-% IV SOLN
2.0000 g | Freq: Four times a day (QID) | INTRAVENOUS | Status: AC
  Administered 2023-01-22 (×2): 2 g via INTRAVENOUS
  Filled 2023-01-22 (×2): qty 100

## 2023-01-22 MED ORDER — DEXAMETHASONE SODIUM PHOSPHATE 10 MG/ML IJ SOLN
INTRAMUSCULAR | Status: DC | PRN
  Administered 2023-01-22: 10 mg via INTRAVENOUS

## 2023-01-22 MED ORDER — SODIUM CHLORIDE (PF) 0.9 % IJ SOLN
INTRAMUSCULAR | Status: AC
  Filled 2023-01-22: qty 50

## 2023-01-22 MED ORDER — LIDOCAINE 2% (20 MG/ML) 5 ML SYRINGE
INTRAMUSCULAR | Status: DC | PRN
  Administered 2023-01-22: 40 mg via INTRAVENOUS

## 2023-01-22 MED ORDER — ACETAMINOPHEN 10 MG/ML IV SOLN
1000.0000 mg | Freq: Four times a day (QID) | INTRAVENOUS | Status: DC
  Administered 2023-01-22: 1000 mg via INTRAVENOUS

## 2023-01-22 MED ORDER — ONDANSETRON HCL 4 MG/2ML IJ SOLN
INTRAMUSCULAR | Status: DC | PRN
  Administered 2023-01-22: 4 mg via INTRAVENOUS

## 2023-01-22 MED ORDER — BISACODYL 10 MG RE SUPP: 10.0000 mg | Freq: Every day | RECTAL | Status: DC | PRN

## 2023-01-22 MED ORDER — BUPIVACAINE LIPOSOME 1.3 % IJ SUSP: 20.0000 mL | Freq: Once | INTRAMUSCULAR | Status: DC

## 2023-01-22 MED ORDER — MENTHOL 3 MG MT LOZG: 1.0000 | LOZENGE | OROMUCOSAL | Status: DC | PRN

## 2023-01-22 MED ORDER — METOPROLOL TARTRATE 50 MG PO TABS
50.0000 mg | ORAL_TABLET | Freq: Two times a day (BID) | ORAL | Status: DC
  Administered 2023-01-23: 50 mg via ORAL
  Filled 2023-01-22: qty 1

## 2023-01-22 MED ORDER — SODIUM CHLORIDE (PF) 0.9 % IJ SOLN
INTRAMUSCULAR | Status: AC
  Filled 2023-01-22: qty 10

## 2023-01-22 MED ORDER — DIPHENHYDRAMINE HCL 12.5 MG/5ML PO ELIX: 12.5000 mg | ORAL_SOLUTION | ORAL | Status: DC | PRN

## 2023-01-22 MED ORDER — LACTATED RINGERS IV SOLN: INTRAVENOUS | Status: DC

## 2023-01-22 MED ORDER — ROCURONIUM BROMIDE 10 MG/ML (PF) SYRINGE
PREFILLED_SYRINGE | INTRAVENOUS | Status: AC
  Filled 2023-01-22: qty 10

## 2023-01-22 MED ORDER — FENTANYL CITRATE (PF) 250 MCG/5ML IJ SOLN
INTRAMUSCULAR | Status: AC
  Filled 2023-01-22: qty 5

## 2023-01-22 MED ORDER — DEXAMETHASONE SODIUM PHOSPHATE 10 MG/ML IJ SOLN
INTRAMUSCULAR | Status: DC | PRN
  Administered 2023-01-22: 10 mg

## 2023-01-22 MED ORDER — PHENYLEPHRINE 80 MCG/ML (10ML) SYRINGE FOR IV PUSH (FOR BLOOD PRESSURE SUPPORT)
PREFILLED_SYRINGE | INTRAVENOUS | Status: DC | PRN
  Administered 2023-01-22: 160 ug via INTRAVENOUS

## 2023-01-22 MED ORDER — SUGAMMADEX SODIUM 200 MG/2ML IV SOLN
INTRAVENOUS | Status: DC | PRN
  Administered 2023-01-22: 200 mg via INTRAVENOUS

## 2023-01-22 MED ORDER — PROPOFOL 1000 MG/100ML IV EMUL
INTRAVENOUS | Status: AC
  Filled 2023-01-22: qty 100

## 2023-01-22 MED ORDER — PANTOPRAZOLE SODIUM 40 MG PO TBEC
40.0000 mg | DELAYED_RELEASE_TABLET | Freq: Every day | ORAL | Status: DC
  Administered 2023-01-23: 40 mg via ORAL
  Filled 2023-01-22: qty 1

## 2023-01-22 MED ORDER — HYDROMORPHONE HCL 1 MG/ML IJ SOLN
0.2500 mg | INTRAMUSCULAR | Status: DC | PRN
  Administered 2023-01-22 (×4): 0.5 mg via INTRAVENOUS

## 2023-01-22 MED ORDER — OXYCODONE HCL 5 MG PO TABS
5.0000 mg | ORAL_TABLET | Freq: Once | ORAL | Status: AC | PRN
  Administered 2023-01-22: 5 mg via ORAL

## 2023-01-22 MED ORDER — FLEET ENEMA 7-19 GM/118ML RE ENEM: 1.0000 | ENEMA | Freq: Once | RECTAL | Status: DC | PRN

## 2023-01-22 MED ORDER — ONDANSETRON HCL 4 MG/2ML IJ SOLN: 4.0000 mg | Freq: Once | INTRAMUSCULAR | Status: DC | PRN

## 2023-01-22 MED ORDER — PHENOL 1.4 % MT LIQD: 1.0000 | OROMUCOSAL | Status: DC | PRN

## 2023-01-22 MED ORDER — OXYCODONE HCL 5 MG PO TABS
ORAL_TABLET | ORAL | Status: AC
  Administered 2023-01-23: 10 mg via ORAL
  Filled 2023-01-22: qty 1

## 2023-01-22 MED ORDER — METHOCARBAMOL 500 MG IVPB - SIMPLE MED
500.0000 mg | Freq: Four times a day (QID) | INTRAVENOUS | Status: DC | PRN
  Administered 2023-01-22: 500 mg via INTRAVENOUS
  Filled 2023-01-22: qty 500

## 2023-01-22 MED ORDER — CEFAZOLIN SODIUM-DEXTROSE 2-4 GM/100ML-% IV SOLN
2.0000 g | INTRAVENOUS | Status: AC
  Administered 2023-01-22: 2 g via INTRAVENOUS
  Filled 2023-01-22: qty 100

## 2023-01-22 MED ORDER — MIDAZOLAM HCL 2 MG/2ML IJ SOLN
1.0000 mg | INTRAMUSCULAR | Status: AC
  Administered 2023-01-22: 1 mg via INTRAVENOUS
  Filled 2023-01-22: qty 2

## 2023-01-22 MED ORDER — PHENYLEPHRINE HCL (PRESSORS) 10 MG/ML IV SOLN: INTRAVENOUS | Status: DC | PRN

## 2023-01-22 MED ORDER — DEXAMETHASONE SODIUM PHOSPHATE 10 MG/ML IJ SOLN
10.0000 mg | Freq: Once | INTRAMUSCULAR | Status: AC
  Administered 2023-01-23: 10 mg via INTRAVENOUS
  Filled 2023-01-22: qty 1

## 2023-01-22 MED ORDER — LEVOTHYROXINE SODIUM 100 MCG PO TABS
100.0000 ug | ORAL_TABLET | Freq: Every day | ORAL | Status: DC
  Administered 2023-01-23: 100 ug via ORAL
  Filled 2023-01-22: qty 1

## 2023-01-22 MED ORDER — ACETAMINOPHEN 500 MG PO TABS
1000.0000 mg | ORAL_TABLET | Freq: Four times a day (QID) | ORAL | Status: AC
  Administered 2023-01-22 – 2023-01-23 (×4): 1000 mg via ORAL
  Filled 2023-01-22 (×4): qty 2

## 2023-01-22 MED ORDER — DEXAMETHASONE SODIUM PHOSPHATE 10 MG/ML IJ SOLN: 8.0000 mg | Freq: Once | INTRAMUSCULAR | Status: DC

## 2023-01-22 MED ORDER — POVIDONE-IODINE 10 % EX SWAB: 2.0000 | Freq: Once | CUTANEOUS | Status: DC

## 2023-01-22 MED ORDER — TRANEXAMIC ACID-NACL 1000-0.7 MG/100ML-% IV SOLN
1000.0000 mg | INTRAVENOUS | Status: AC
  Administered 2023-01-22: 1000 mg via INTRAVENOUS
  Filled 2023-01-22: qty 100

## 2023-01-22 MED ORDER — SODIUM CHLORIDE 0.9 % IV SOLN: INTRAVENOUS | Status: DC

## 2023-01-22 MED ORDER — PROPOFOL 500 MG/50ML IV EMUL
INTRAVENOUS | Status: DC | PRN
  Administered 2023-01-22: 100 ug/kg/min via INTRAVENOUS

## 2023-01-22 MED ORDER — OXYCODONE HCL 5 MG PO TABS
5.0000 mg | ORAL_TABLET | ORAL | Status: DC | PRN
  Administered 2023-01-22 – 2023-01-23 (×4): 10 mg via ORAL
  Filled 2023-01-22 (×5): qty 2

## 2023-01-22 MED ORDER — ONDANSETRON HCL 4 MG PO TABS: 4.0000 mg | ORAL_TABLET | Freq: Four times a day (QID) | ORAL | Status: DC | PRN

## 2023-01-22 MED ORDER — 0.9 % SODIUM CHLORIDE (POUR BTL) OPTIME
TOPICAL | Status: DC | PRN
  Administered 2023-01-22: 1000 mL

## 2023-01-22 MED ORDER — CHLORHEXIDINE GLUCONATE 0.12 % MT SOLN: 15.0000 mL | Freq: Once | OROMUCOSAL | Status: AC

## 2023-01-22 MED ORDER — ONDANSETRON HCL 4 MG/2ML IJ SOLN
INTRAMUSCULAR | Status: AC
  Filled 2023-01-22: qty 2

## 2023-01-22 MED ORDER — KETAMINE HCL 10 MG/ML IJ SOLN
INTRAMUSCULAR | Status: DC | PRN
  Administered 2023-01-22: 20 mg via INTRAVENOUS
  Administered 2023-01-22: 30 mg via INTRAVENOUS

## 2023-01-22 MED ORDER — KETOROLAC TROMETHAMINE 30 MG/ML IJ SOLN
15.0000 mg | Freq: Once | INTRAMUSCULAR | Status: AC | PRN
  Administered 2023-01-22: 15 mg via INTRAVENOUS

## 2023-01-22 MED ORDER — KETOROLAC TROMETHAMINE 15 MG/ML IJ SOLN
INTRAMUSCULAR | Status: AC
  Filled 2023-01-22: qty 1

## 2023-01-22 MED ORDER — DOCUSATE SODIUM 100 MG PO CAPS
100.0000 mg | ORAL_CAPSULE | Freq: Two times a day (BID) | ORAL | Status: DC
  Administered 2023-01-22 – 2023-01-23 (×2): 100 mg via ORAL
  Filled 2023-01-22 (×2): qty 1

## 2023-01-22 MED ORDER — HYDROMORPHONE HCL 1 MG/ML IJ SOLN
INTRAMUSCULAR | Status: AC
  Filled 2023-01-22: qty 2

## 2023-01-22 MED ORDER — PROPOFOL 10 MG/ML IV BOLUS
INTRAVENOUS | Status: DC | PRN
  Administered 2023-01-22: 120 mg via INTRAVENOUS

## 2023-01-22 MED ORDER — ONDANSETRON HCL 4 MG/2ML IJ SOLN
4.0000 mg | Freq: Four times a day (QID) | INTRAMUSCULAR | Status: DC | PRN
  Administered 2023-01-22: 4 mg via INTRAVENOUS
  Filled 2023-01-22: qty 2

## 2023-01-22 MED ORDER — HYDROMORPHONE HCL 1 MG/ML IJ SOLN
INTRAMUSCULAR | Status: AC
  Filled 2023-01-22: qty 1

## 2023-01-22 MED ORDER — LORATADINE 10 MG PO TABS
10.0000 mg | ORAL_TABLET | Freq: Every day | ORAL | Status: DC
  Administered 2023-01-23: 10 mg via ORAL
  Filled 2023-01-22: qty 1

## 2023-01-22 MED ORDER — PROPOFOL 500 MG/50ML IV EMUL
INTRAVENOUS | Status: AC
  Filled 2023-01-22: qty 50

## 2023-01-22 MED ORDER — HYDROMORPHONE HCL 1 MG/ML IJ SOLN: 0.2500 mg | INTRAMUSCULAR | Status: DC | PRN

## 2023-01-22 MED ORDER — METHOCARBAMOL 500 MG PO TABS
ORAL_TABLET | ORAL | Status: AC
  Filled 2023-01-22: qty 1

## 2023-01-22 MED ORDER — HYDROMORPHONE HCL 2 MG/ML IJ SOLN
INTRAMUSCULAR | Status: AC
  Filled 2023-01-22: qty 1

## 2023-01-22 MED ORDER — ROCURONIUM BROMIDE 10 MG/ML (PF) SYRINGE
PREFILLED_SYRINGE | INTRAVENOUS | Status: DC | PRN
  Administered 2023-01-22: 50 mg via INTRAVENOUS

## 2023-01-22 MED ORDER — APIXABAN 2.5 MG PO TABS
2.5000 mg | ORAL_TABLET | Freq: Two times a day (BID) | ORAL | Status: DC
  Administered 2023-01-23: 2.5 mg via ORAL
  Filled 2023-01-22: qty 1

## 2023-01-22 MED ORDER — HYDROMORPHONE HCL 1 MG/ML IJ SOLN
INTRAMUSCULAR | Status: DC | PRN
  Administered 2023-01-22: 1 mg via INTRAVENOUS
  Administered 2023-01-22 (×2): .5 mg via INTRAVENOUS

## 2023-01-22 MED ORDER — BUPIVACAINE LIPOSOME 1.3 % IJ SUSP
INTRAMUSCULAR | Status: DC | PRN
  Administered 2023-01-22: 20 mL

## 2023-01-22 MED ORDER — ROPINIROLE HCL 1 MG PO TABS
2.0000 mg | ORAL_TABLET | Freq: Every day | ORAL | Status: DC
  Administered 2023-01-22: 2 mg via ORAL
  Filled 2023-01-22: qty 2

## 2023-01-22 MED ORDER — BUPIVACAINE LIPOSOME 1.3 % IJ SUSP
INTRAMUSCULAR | Status: AC
  Filled 2023-01-22: qty 20

## 2023-01-22 MED ORDER — ORAL CARE MOUTH RINSE: 15.0000 mL | Freq: Once | OROMUCOSAL | Status: AC

## 2023-01-22 MED ORDER — SODIUM CHLORIDE 0.9 % IR SOLN
Status: DC | PRN
  Administered 2023-01-22: 1000 mL

## 2023-01-22 MED ORDER — SODIUM CHLORIDE (PF) 0.9 % IJ SOLN
INTRAMUSCULAR | Status: DC | PRN
  Administered 2023-01-22: 60 mL via INTRAVENOUS

## 2023-01-22 MED ORDER — KETAMINE HCL 50 MG/5ML IJ SOSY
PREFILLED_SYRINGE | INTRAMUSCULAR | Status: AC
  Filled 2023-01-22: qty 5

## 2023-01-22 MED ORDER — TRAMADOL HCL 50 MG PO TABS
50.0000 mg | ORAL_TABLET | Freq: Four times a day (QID) | ORAL | Status: DC | PRN
  Administered 2023-01-23: 100 mg via ORAL
  Filled 2023-01-22: qty 2

## 2023-01-22 MED ORDER — AMISULPRIDE (ANTIEMETIC) 5 MG/2ML IV SOLN: 10.0000 mg | Freq: Once | INTRAVENOUS | Status: DC | PRN

## 2023-01-22 MED ORDER — DEXAMETHASONE SODIUM PHOSPHATE 10 MG/ML IJ SOLN
INTRAMUSCULAR | Status: AC
  Filled 2023-01-22: qty 1

## 2023-01-22 MED ORDER — ALBUTEROL SULFATE (2.5 MG/3ML) 0.083% IN NEBU: 3.0000 mL | INHALATION_SOLUTION | Freq: Four times a day (QID) | RESPIRATORY_TRACT | Status: DC | PRN

## 2023-01-22 SURGICAL SUPPLY — 57 items
ADH SKN CLS APL DERMABOND .7 (GAUZE/BANDAGES/DRESSINGS) ×1
ATTUNE MED DOME PAT 38 KNEE (Knees) IMPLANT
ATTUNE PS FEM LT SZ 5 CEM KNEE (Femur) IMPLANT
BAG COUNTER SPONGE SURGICOUNT (BAG) IMPLANT
BAG SPEC THK2 15X12 ZIP CLS (MISCELLANEOUS) ×1
BAG SPNG CNTER NS LX DISP (BAG)
BAG ZIPLOCK 12X15 (MISCELLANEOUS) ×2 IMPLANT
BASE TIBIAL ROT PLAT SZ 5 KNEE (Knees) IMPLANT
BLADE SAG 18X100X1.27 (BLADE) ×2 IMPLANT
BLADE SAW SGTL 11.0X1.19X90.0M (BLADE) ×2 IMPLANT
BNDG CMPR 5X62 HK CLSR LF (GAUZE/BANDAGES/DRESSINGS) ×1
BNDG CMPR 6"X 5 YARDS HK CLSR (GAUZE/BANDAGES/DRESSINGS) ×1
BNDG ELASTIC 6INX 5YD STR LF (GAUZE/BANDAGES/DRESSINGS) ×2 IMPLANT
BOWL SMART MIX CTS (DISPOSABLE) ×2 IMPLANT
BSPLAT TIB 5 CMNT ROT PLAT STR (Knees) ×1 IMPLANT
CEMENT HV SMART SET (Cement) ×4 IMPLANT
COVER SURGICAL LIGHT HANDLE (MISCELLANEOUS) ×2 IMPLANT
CUFF TOURN SGL QUICK 34 (TOURNIQUET CUFF) ×1
CUFF TRNQT CYL 34X4.125X (TOURNIQUET CUFF) ×2 IMPLANT
DERMABOND ADVANCED .7 DNX12 (GAUZE/BANDAGES/DRESSINGS) ×2 IMPLANT
DRAPE INCISE IOBAN 66X45 STRL (DRAPES) ×2 IMPLANT
DRAPE U-SHAPE 47X51 STRL (DRAPES) ×2 IMPLANT
DRSG AQUACEL AG ADV 3.5X10 (GAUZE/BANDAGES/DRESSINGS) ×2 IMPLANT
DURAPREP 26ML APPLICATOR (WOUND CARE) ×2 IMPLANT
ELECT REM PT RETURN 15FT ADLT (MISCELLANEOUS) ×2 IMPLANT
GLOVE BIO SURGEON STRL SZ 6.5 (GLOVE) IMPLANT
GLOVE BIO SURGEON STRL SZ8 (GLOVE) ×2 IMPLANT
GLOVE BIOGEL PI IND STRL 6.5 (GLOVE) IMPLANT
GLOVE BIOGEL PI IND STRL 7.0 (GLOVE) IMPLANT
GLOVE BIOGEL PI IND STRL 8 (GLOVE) ×2 IMPLANT
GOWN STRL REUS W/ TWL LRG LVL3 (GOWN DISPOSABLE) ×2 IMPLANT
GOWN STRL REUS W/TWL LRG LVL3 (GOWN DISPOSABLE) ×1
HANDPIECE INTERPULSE COAX TIP (DISPOSABLE) ×1
HOLDER FOLEY CATH W/STRAP (MISCELLANEOUS) IMPLANT
IMMOBILIZER KNEE 20 (SOFTGOODS) ×1
IMMOBILIZER KNEE 20 THIGH 36 (SOFTGOODS) ×2 IMPLANT
INSERT TIBIAL KNEE SZ 5 12MM (Insert) IMPLANT
KIT TURNOVER KIT A (KITS) IMPLANT
MANIFOLD NEPTUNE II (INSTRUMENTS) ×2 IMPLANT
NS IRRIG 1000ML POUR BTL (IV SOLUTION) ×2 IMPLANT
PACK TOTAL KNEE CUSTOM (KITS) ×2 IMPLANT
PADDING CAST COTTON 6X4 STRL (CAST SUPPLIES) ×4 IMPLANT
PIN STEINMAN FIXATION KNEE (PIN) IMPLANT
PROTECTOR NERVE ULNAR (MISCELLANEOUS) ×2 IMPLANT
SET HNDPC FAN SPRY TIP SCT (DISPOSABLE) ×2 IMPLANT
SPIKE FLUID TRANSFER (MISCELLANEOUS) ×2 IMPLANT
SUT MNCRL AB 4-0 PS2 18 (SUTURE) ×2 IMPLANT
SUT STRATAFIX 0 PDS 27 VIOLET (SUTURE) ×1
SUT VIC AB 2-0 CT1 27 (SUTURE) ×3
SUT VIC AB 2-0 CT1 TAPERPNT 27 (SUTURE) ×6 IMPLANT
SUTURE STRATFX 0 PDS 27 VIOLET (SUTURE) ×2 IMPLANT
TIBIAL BASE ROT PLAT SZ 5 KNEE (Knees) ×1 IMPLANT
TIBIAL INSERT KNEE SZ 5 12MM (Insert) ×1 IMPLANT
TRAY FOLEY MTR SLVR 16FR STAT (SET/KITS/TRAYS/PACK) ×2 IMPLANT
TUBE SUCTION HIGH CAP CLEAR NV (SUCTIONS) ×2 IMPLANT
WATER STERILE IRR 1000ML POUR (IV SOLUTION) ×4 IMPLANT
WRAP KNEE MAXI GEL POST OP (GAUZE/BANDAGES/DRESSINGS) ×2 IMPLANT

## 2023-01-22 NOTE — Addendum Note (Signed)
Addendum  created 01/22/23 1430 by Florene Route, CRNA   Intraprocedure Meds edited

## 2023-01-22 NOTE — Anesthesia Postprocedure Evaluation (Addendum)
Anesthesia Post Note  Patient: Rebecca Brooks  Procedure(s) Performed: LEFT TOTAL KNEE ARTHROPLASTY (Left: Knee)     Patient location during evaluation: PACU Anesthesia Type: Regional and General Level of consciousness: awake and alert, oriented and patient cooperative Pain management: pain level controlled Vital Signs Assessment: post-procedure vital signs reviewed and stable Respiratory status: spontaneous breathing, nonlabored ventilation and respiratory function stable Cardiovascular status: blood pressure returned to baseline and stable Postop Assessment: no apparent nausea or vomiting Anesthetic complications: no   No notable events documented.  Last Vitals:  Vitals:   01/22/23 1023 01/22/23 1028  BP: 119/83 128/65  Pulse: 62 60  Resp: 13 15  Temp:    SpO2: 98% 95%    Last Pain:  Vitals:   01/22/23 1028  TempSrc:   PainSc: 0-No pain                 Florene Route

## 2023-01-22 NOTE — Anesthesia Procedure Notes (Signed)
Anesthesia Regional Block: Adductor canal block   Pre-Anesthetic Checklist: , timeout performed,  Correct Patient, Correct Site, Correct Laterality,  Correct Procedure, Correct Position, site marked,  Risks and benefits discussed,  Surgical consent,  Pre-op evaluation,  At surgeon's request and post-op pain management  Laterality: Left  Prep: Maximum Sterile Barrier Precautions used, chloraprep       Needles:  Injection technique: Single-shot  Needle Type: Echogenic Stimulator Needle     Needle Length: 9cm  Needle Gauge: 22     Additional Needles:   Procedures:,,,, ultrasound used (permanent image in chart),,    Narrative:  Start time: 01/22/2023 10:00 AM End time: 01/22/2023 10:05 AM Injection made incrementally with aspirations every 5 mL.  Performed by: Personally  Anesthesiologist: Lannie Fields, DO  Additional Notes: Monitors applied. No increased pain on injection. No increased resistance to injection. Injection made in 5cc increments. Good needle visualization. Patient tolerated procedure well.

## 2023-01-22 NOTE — Evaluation (Signed)
Physical Therapy Evaluation Patient Details Name: Rebecca Brooks MRN: [id number] DOB: 07-18-1951 Today's Date: 01/22/2023  History of Present Illness  72 yo female presents to therapy s/p L TKA on 01/22/2023 due to failure of conservative measures. Pt PMH includes but is not limited to: gout, cardiac murmur, lumbar stenosis, depression, hyperglycemia, OSA, anemia, DDD of lumbar and cervical region, R THA, angina, HDL, HTN, and lumbar sx.  Clinical Impression    Rebecca Brooks is a 72 y.o. female POD 0 s/p L TKA. Patient reports mod I with SPC with mobility at baseline. Patient is now limited by functional impairments (see PT problem list below) and requires min A  for bed mobility and mod A and cues for transfers. Patient was able to ambulate min guard feet with RW and 25 level of assist. Patient instructed in exercise to facilitate ROM and circulation to manage edema. Patient will benefit from continued skilled PT interventions to address impairments and progress towards PLOF. Acute PT will follow to progress mobility and stair training in preparation for safe discharge home with family support and OPPT services starting 7/5.       Assistance Recommended at Discharge Intermittent Supervision/Assistance  If plan is discharge home, recommend the following:  Can travel by private vehicle  A little help with walking and/or transfers;A little help with bathing/dressing/bathroom;Assistance with cooking/housework;Assist for transportation        Equipment Recommendations None recommended by PT (pt reports DME in home setting)  Recommendations for Other Services       Functional Status Assessment Patient has had a recent decline in their functional status and demonstrates the ability to make significant improvements in function in a reasonable and predictable amount of time.     Precautions / Restrictions Precautions Precautions: Knee;Fall Restrictions Weight Bearing Restrictions:  No      Mobility  Bed Mobility Overal bed mobility: Needs Assistance Bed Mobility: Supine to Sit     Supine to sit: Min assist     General bed mobility comments: cues for use of hospital bed and min A for trunk and to assist LLE to floor    Transfers Overall transfer level: Needs assistance Equipment used: Rolling walker (2 wheels) Transfers: Sit to/from Stand Sit to Stand: Mod assist           General transfer comment: cues for proper UE placement, mod A to complete power up and cues for extension posture once in standing    Ambulation/Gait Ambulation/Gait assistance: Min guard Gait Distance (Feet): 25 Feet Assistive device: Rolling walker (2 wheels) Gait Pattern/deviations: Step-to pattern, Trunk flexed, Antalgic Gait velocity: decreased     General Gait Details: pt demonstrated limited B foot clearance, able to lead with LLE and min cues for proper posture and body position inside RW  Stairs            Wheelchair Mobility     Tilt Bed    Modified Rankin (Stroke Patients Only)       Balance Overall balance assessment: Needs assistance, History of Falls (pt states no recent falls) Sitting-balance support: Feet supported Sitting balance-Leahy Scale: Good     Standing balance support: Bilateral upper extremity supported, Reliant on assistive device for balance, During functional activity Standing balance-Leahy Scale: Poor                               Pertinent Vitals/Pain Pain Assessment Pain Assessment: 0-10 Pain Score: 6  Pain Location: L knee and leg Pain Descriptors / Indicators: Aching, Constant, Discomfort, Operative site guarding Pain Intervention(s): Limited activity within patient's tolerance, Monitored during session, Premedicated before session, Repositioned, Ice applied    Home Living Family/patient expects to be discharged to:: Private residence Living Arrangements: Spouse/significant other Available Help at  Discharge: Family (pt reports sister here currently and then sister in law will help out at home) Type of Home: House Home Access: Ramped entrance (small ramp from garage)       Home Layout: One level Home Equipment: Agricultural consultant (2 wheels);Cane - single point;BSC/3in1;Tub bench;Adaptive equipment      Prior Function Prior Level of Function : Independent/Modified Independent             Mobility Comments: mod I at [facility] level with all ADLs, self care tasks, and IADLs       Hand Dominance   Dominant Hand: Right    Extremity/Trunk Assessment        Lower Extremity Assessment Lower Extremity Assessment: LLE deficits/detail LLE Deficits / Details: ankle DF/PF 5/5; SLR < 10 degree lag LLE Sensation: WNL    Cervical / Trunk Assessment Cervical / Trunk Assessment:  (wfl)  Communication   Communication: No difficulties  Cognition Arousal/Alertness: Awake/alert Behavior During Therapy: WFL for tasks assessed/performed Overall Cognitive Status: Within Functional Limits for tasks assessed                                          General Comments General comments (skin integrity, edema, etc.): L LE distal edema > R LE    Exercises Total Joint Exercises Ankle Circles/Pumps: AROM, Both, 20 reps   Assessment/Plan    PT Assessment Patient needs continued PT services  PT Problem List Decreased strength;Decreased range of motion;Decreased activity tolerance;Decreased balance;Decreased mobility;Decreased coordination;Pain       PT Treatment Interventions DME instruction;Gait training;Functional mobility training;Therapeutic exercise;Therapeutic activities;Balance training;Neuromuscular re-education;Patient/family education;Modalities    PT Goals (Current goals can be found in the Care Plan section)  Acute Rehab PT Goals Patient Stated Goal: to be able to go shopping PT Goal Formulation: With patient Time For Goal Achievement: 02/05/23 Potential to  Achieve Goals: Good    Frequency 7X/week     Co-evaluation               AM-PAC PT "6 Clicks" Mobility  Outcome Measure Help needed turning from your back to your side while in a flat bed without using bedrails?: A Little Help needed moving from lying on your back to sitting on the side of a flat bed without using bedrails?: A Little Help needed moving to and from a bed to a chair (including a wheelchair)?: A Little Help needed standing up from a chair using your arms (e.g., wheelchair or bedside chair)?: A Lot Help needed to walk in hospital room?: A Little Help needed climbing 3-5 steps with a railing? : Total 6 Click Score: 15    End of Session Equipment Utilized During Treatment: Gait belt Activity Tolerance: Patient tolerated treatment well Patient left: in chair;with call bell/phone within reach;with chair alarm set;with family/visitor present Nurse Communication: Mobility status PT Visit Diagnosis: Unsteadiness on feet (R26.81);Other abnormalities of gait and mobility (R26.89);Muscle weakness (generalized) (M62.81);History of falling (Z91.81);Pain Pain - Right/Left: Left Pain - part of body: Knee;Leg    Time: 1721-1744 PT Time Calculation (min) (ACUTE ONLY): 23 min  Charges:   PT Evaluation $PT Eval Low Complexity: 1 Low PT Treatments $Gait Training: 8-22 mins PT General Charges $$ ACUTE PT VISIT: 1 Visit         Johnny Bridge, PT Acute Rehab   Jacqualyn Posey 01/22/2023, 5:56 PM

## 2023-01-22 NOTE — Discharge Instructions (Signed)
 Frank Aluisio, MD Total Joint Specialist [facility] [address] [place], [place] [address] [phone]  TOTAL KNEE REPLACEMENT POSTOPERATIVE DIRECTIONS    Knee Rehabilitation, Guidelines Following Surgery  Results after knee surgery are often greatly improved when you follow the exercise, range of motion and muscle strengthening exercises prescribed by your doctor. Safety measures are also important to protect the knee from further injury. If any of these exercises cause you to have increased pain or swelling in your knee joint, decrease the amount until you are comfortable again and slowly increase them. If you have problems or questions, call your caregiver or physical therapist for advice.   HOME CARE INSTRUCTIONS  Remove items at home which could result in a fall. This includes throw rugs or furniture in walking pathways.  ICE to the affected knee as much as tolerated. Icing helps control swelling. If the swelling is well controlled you will be more comfortable and rehab easier. Continue to use ice on the knee for pain and swelling from surgery. You may notice swelling that will progress down to the foot and ankle. This is normal after surgery. Elevate the leg when you are not up walking on it.    Continue to use the breathing machine which will help keep your temperature down. It is common for your temperature to cycle up and down following surgery, especially at night when you are not up moving around and exerting yourself. The breathing machine keeps your lungs expanded and your temperature down. Do not place pillow under the operative knee, focus on keeping the knee straight while resting  DIET You may resume your previous home diet once you are discharged from the hospital.  DRESSING / WOUND CARE / SHOWERING Keep your bulky bandage on for 2 days. On the third post-operative day you may remove the Ace bandage and gauze. There is a waterproof  adhesive bandage on your skin which will stay in place until your first follow-up appointment. Once you remove this you will not need to place another bandage You may begin showering 3 days following surgery, but do not submerge the incision under water.  ACTIVITY For the first 5 days, the key is rest and control of pain and swelling Do your home exercises twice a day starting on post-operative day 3. On the days you go to physical therapy, just do the home exercises once that day. You should rest, ice and elevate the leg for 50 minutes out of every hour. Get up and walk/stretch for 10 minutes per hour. After 5 days you can increase your activity slowly as tolerated. Walk with your walker as instructed. Use the walker until you are comfortable transitioning to a cane. Walk with the cane in the opposite hand of the operative leg. You may discontinue the cane once you are comfortable and walking steadily. Avoid periods of inactivity such as sitting longer than an hour when not asleep. This helps prevent blood clots.  You may discontinue the knee immobilizer once you are able to perform a straight leg raise while lying down. You may resume a sexual relationship in one month or when given the OK by your doctor.  You may return to work once you are cleared by your doctor.  Do not drive a car for 6 weeks or until released by your surgeon.  Do not drive while taking narcotics.  TED HOSE STOCKINGS Wear the elastic stockings on both legs for three weeks following surgery during the   day. You may remove them at night for sleeping.  WEIGHT BEARING Weight bearing as tolerated with assist device (walker, cane, etc) as directed, use it as long as suggested by your surgeon or therapist, typically at least 4-6 weeks.  POSTOPERATIVE CONSTIPATION PROTOCOL Constipation - defined medically as fewer than three stools per week and severe constipation as less than one stool per week.  One of the most common issues  patients have following surgery is constipation.  Even if you have a regular bowel pattern at home, your normal regimen is likely to be disrupted due to multiple reasons following surgery.  Combination of anesthesia, postoperative narcotics, change in appetite and fluid intake all can affect your bowels.  In order to avoid complications following surgery, here are some recommendations in order to help you during your recovery period.  Colace (docusate) - Pick up an over-the-counter form of Colace or another stool softener and take twice a day as long as you are requiring postoperative pain medications.  Take with a full glass of water daily.  If you experience loose stools or diarrhea, hold the colace until you stool forms back up. If your symptoms do not get better within 1 week or if they get worse, check with your doctor. Dulcolax (bisacodyl) - Pick up over-the-counter and take as directed by the product packaging as needed to assist with the movement of your bowels.  Take with a full glass of water.  Use this product as needed if not relieved by Colace only.  MiraLax (polyethylene glycol) - Pick up over-the-counter to have on hand. MiraLax is a solution that will increase the amount of water in your bowels to assist with bowel movements.  Take as directed and can mix with a glass of water, juice, soda, coffee, or tea. Take if you go more than two days without a movement. Do not use MiraLax more than once per day. Call your doctor if you are still constipated or irregular after using this medication for 7 days in a row.  If you continue to have problems with postoperative constipation, please contact the office for further assistance and recommendations.  If you experience "the worst abdominal pain ever" or develop nausea or vomiting, please contact the office immediatly for further recommendations for treatment.  ITCHING If you experience itching with your medications, try taking only a single pain  pill, or even half a pain pill at a time.  You can also use Benadryl over the counter for itching or also to help with sleep.   MEDICATIONS See your medication summary on the "After Visit Summary" that the nursing staff will review with you prior to discharge.  You may have some home medications which will be placed on hold until you complete the course of blood thinner medication.  It is important for you to complete the blood thinner medication as prescribed by your surgeon.  Continue your approved medications as instructed at time of discharge.  PRECAUTIONS If you experience chest pain or shortness of breath - call 911 immediately for transfer to the hospital emergency department.  If you develop a fever greater that 101 F, purulent drainage from wound, increased redness or drainage from wound, foul odor from the wound/dressing, or calf pain - CONTACT YOUR SURGEON.                                                     FOLLOW-UP APPOINTMENTS Make sure you keep all of your appointments after your operation with your surgeon and caregivers. You should call the office at the above phone number and make an appointment for approximately two weeks after the date of your surgery or on the date instructed by your surgeon outlined in the "After Visit Summary".  RANGE OF MOTION AND STRENGTHENING EXERCISES  Rehabilitation of the knee is important following a knee injury or an operation. After just a few days of immobilization, the muscles of the thigh which control the knee become weakened and shrink (atrophy). Knee exercises are designed to build up the tone and strength of the thigh muscles and to improve knee motion. Often times heat used for twenty to thirty minutes before working out will loosen up your tissues and help with improving the range of motion but do not use heat for the first two weeks following surgery. These exercises can be done on a training (exercise) mat, on the floor, on a table or on a bed.  Use what ever works the best and is most comfortable for you Knee exercises include:  Leg Lifts - While your knee is still immobilized in a splint or cast, you can do straight leg raises. Lift the leg to 60 degrees, hold for 3 sec, and slowly lower the leg. Repeat 10-20 times 2-3 times daily. Perform this exercise against resistance later as your knee gets better.  Quad and Hamstring Sets - Tighten up the muscle on the front of the thigh (Quad) and hold for 5-10 sec. Repeat this 10-20 times hourly. Hamstring sets are done by pushing the foot backward against an object and holding for 5-10 sec. Repeat as with quad sets.  Leg Slides: Lying on your back, slowly slide your foot toward your buttocks, bending your knee up off the floor (only go as far as is comfortable). Then slowly slide your foot back down until your leg is flat on the floor again. Angel Wings: Lying on your back spread your legs to the side as far apart as you can without causing discomfort.  A rehabilitation program following serious knee injuries can speed recovery and prevent re-injury in the future due to weakened muscles. Contact your doctor or a physical therapist for more information on knee rehabilitation.   POST-OPERATIVE OPIOID TAPER INSTRUCTIONS: It is important to wean off of your opioid medication as soon as possible. If you do not need pain medication after your surgery it is ok to stop day one. Opioids include: Codeine, Hydrocodone(Norco, Vicodin), Oxycodone(Percocet, oxycontin) and hydromorphone amongst others.  Long term and even short term use of opiods can cause: Increased pain response Dependence Constipation Depression Respiratory depression And more.  Withdrawal symptoms can include Flu like symptoms Nausea, vomiting And more Techniques to manage these symptoms Hydrate well Eat regular healthy meals Stay active Use relaxation techniques(deep breathing, meditating, yoga) Do Not substitute Alcohol to help  with tapering If you have been on opioids for less than two weeks and do not have pain than it is ok to stop all together.  Plan to wean off of opioids This plan should start within one week post op of your joint replacement. Maintain the same interval or time between taking each dose and first decrease the dose.  Cut the total daily intake of opioids by one tablet each day Next start to increase the time between doses. The last dose that should be eliminated is the evening dose.   IF YOU ARE TRANSFERRED TO   A SKILLED REHAB FACILITY If the patient is transferred to a skilled rehab facility following release from the hospital, a list of the current medications will be sent to the facility for the patient to continue.  When discharged from the skilled rehab facility, please have the facility set up the patient's Home Health Physical Therapy prior to being released. Also, the skilled facility will be responsible for providing the patient with their medications at time of release from the facility to include their pain medication, the muscle relaxants, and their blood thinner medication. If the patient is still at the rehab facility at time of the two week follow up appointment, the skilled rehab facility will also need to assist the patient in arranging follow up appointment in our office and any transportation needs.  MAKE SURE YOU:  Understand these instructions.  Get help right away if you are not doing well or get worse.   DENTAL ANTIBIOTICS:  In most cases prophylactic antibiotics for Dental procdeures after total joint surgery are not necessary.  Exceptions are as follows:  1. History of prior total joint infection  2. Severely immunocompromised (Organ Transplant, cancer chemotherapy, Rheumatoid biologic meds such as Humera)  3. Poorly controlled diabetes (A1C &gt; 8.0, blood glucose over 200)  If you have one of these conditions, contact your surgeon for an antibiotic prescription,  prior to your dental procedure.    Pick up stool softner and laxative for home use following surgery while on pain medications. Do not submerge incision under water. Please use good hand washing techniques while changing dressing each day. May shower starting three days after surgery. Please use a clean towel to pat the incision dry following showers. Continue to use ice for pain and swelling after surgery. Do not use any lotions or creams on the incision until instructed by your surgeon.  

## 2023-01-22 NOTE — Transfer of Care (Signed)
Immediate Anesthesia Transfer of Care Note  Patient: Rebecca Brooks  Procedure(s) Performed: LEFT TOTAL KNEE ARTHROPLASTY (Left: Knee)  Patient Location: PACU  Anesthesia Type:General  Level of Consciousness: awake, alert , and oriented  Airway & Oxygen Therapy: Patient Spontanous Breathing and Patient connected to face mask oxygen  Post-op Assessment: Report given to RN and Post -op Vital signs reviewed and stable  Post vital signs: Reviewed and stable  Last Vitals:  Vitals Value Taken Time  BP 124/77 01/22/23 1219  Temp    Pulse 70 01/22/23 1220  Resp 11 01/22/23 1220  SpO2 100 % 01/22/23 1220  Vitals shown include unvalidated device data.  Last Pain:  Vitals:   01/22/23 1028  TempSrc:   PainSc: 0-No pain         Complications: No notable events documented.

## 2023-01-22 NOTE — Addendum Note (Signed)
Addendum  created 01/22/23 1333 by Lannie Fields, DO   Clinical Note Signed

## 2023-01-22 NOTE — Progress Notes (Signed)
Orthopedic Tech Progress Note Patient Details:  Rebecca Brooks October 04, 1950 [id number]  Applied CPM. CPM Left Knee CPM Left Knee: On Left Knee Flexion (Degrees): 40 Left Knee Extension (Degrees): 10  Post Interventions Patient Tolerated: Well Instructions Provided: Care of device, Adjustment of device  Sherilyn Banker 01/22/2023, 12:29 PM

## 2023-01-22 NOTE — Op Note (Addendum)
OPERATIVE REPORT-TOTAL KNEE ARTHROPLASTY   Pre-operative diagnosis- Osteoarthritis  Left knee(s)  Post-operative diagnosis- Osteoarthritis Left knee(s)  Procedure-  Left  Total Knee Arthroplasty  Surgeon- Rebecca Rankin. Vergie Zahm, MD  Assistant- Arther Abbott, PA-C   Anesthesia- Adductor canal block and General  EBL- 25 ml   Drains None  Tourniquet time-  Total Tourniquet Time Documented: Thigh (Left) - 35 minutes Total: Thigh (Left) - 35 minutes     Complications- None  Condition-PACU - hemodynamically stable.   Brief Clinical Note  Rebecca Brooks is a 72 y.o. year old female with end stage OA of her left knee with progressively worsening pain and dysfunction. She has constant pain, with activity and at rest and significant functional deficits with difficulties even with ADLs. She has had extensive non-op management including analgesics, injections of cortisone and viscosupplements, and home exercise program, but remains in significant pain with significant dysfunction. Radiographs show bone on bone arthritis lateral and patellofemoral with valgus deformity. She presents now for left Total Knee Arthroplasty.     Procedure in detail---   The patient is brought into the operating room and positioned supine on the operating table. After successful administration of  Adductor canal block and General,   a tourniquet is placed high on the  Left thigh(s) and the lower extremity is prepped and draped in the usual sterile fashion. Time out is performed by the operating team and then the  Left lower extremity is wrapped in Esmarch, knee flexed and the tourniquet inflated to 300 mmHg.       A midline incision is made with a ten blade through the subcutaneous tissue to the level of the extensor mechanism. A fresh blade is used to make a medial parapatellar arthrotomy. Soft tissue over the proximal medial tibia is subperiosteally elevated to the joint line with a knife and into the  semimembranosus bursa with a Cobb elevator. Soft tissue over the proximal lateral tibia is elevated with attention being paid to avoiding the patellar tendon on the tibial tubercle. The patella is everted, knee flexed 90 degrees and the ACL and PCL are removed. Findings are bone on bone lateral and patellofemoral with large global osteophytes        The drill is used to create a starting hole in the distal femur and the canal is thoroughly irrigated with sterile saline to remove the fatty contents. The 5 degree Left  valgus alignment guide is placed into the femoral canal and the distal femoral cutting block is pinned to remove 9 mm off the distal femur. Resection is made with an oscillating saw.      The tibia is subluxed forward and the menisci are removed. The extramedullary alignment guide is placed referencing proximally at the medial aspect of the tibial tubercle and distally along the second metatarsal axis and tibial crest. The block is pinned to remove 2mm off the more deficient lateral  side. Resection is made with an oscillating saw. Size 5is the most appropriate size for the tibia and the proximal tibia is prepared with the modular drill and keel punch for that size.      The femoral sizing guide is placed and size 5 is most appropriate. Rotation is marked off the epicondylar axis and confirmed by creating a rectangular flexion gap at 90 degrees. The size 5 cutting block is pinned in this rotation and the anterior, posterior and chamfer cuts are made with the oscillating saw. The intercondylar block is then placed and that  cut is made.      Trial size 5 tibial component, trial size 5 posterior stabilized femur and a 12  mm posterior stabilized rotating platform insert trial is placed. Full extension is achieved with excellent varus/valgus and anterior/posterior balance throughout full range of motion. The patella is everted and thickness measured to be 22  mm. Free hand resection is taken to 12 mm, a  38 template is placed, lug holes are drilled, trial patella is placed, and it tracks normally. Osteophytes are removed off the posterior femur with the trial in place. All trials are removed and the cut bone surfaces prepared with pulsatile lavage. Cement is mixed and once ready for implantation, the size 5 tibial implant, size  5 posterior stabilized femoral component, and the size 38 patella are cemented in place and the patella is held with the clamp. The trial insert is placed and the knee held in full extension. The Exparel (20 ml mixed with 60 ml saline) is injected into the extensor mechanism, posterior capsule, medial and lateral gutters and subcutaneous tissues.  All extruded cement is removed and once the cement is hard the permanent 12 mm posterior stabilized rotating platform insert is placed into the tibial tray.      The wound is copiously irrigated with saline solution and the extensor mechanism closed with # 0 Stratofix suture. The tourniquet is released for a total tourniquet time of 35  minutes. Flexion against gravity is 140 degrees and the patella tracks normally. Subcutaneous tissue is closed with 2.0 vicryl and subcuticular with running 4.0 Monocryl. The incision is cleaned and dried and steri-strips and a bulky sterile dressing are applied. The limb is placed into a knee immobilizer and the patient is awakened and transported to recovery in stable condition.      Please note that a surgical assistant was a medical necessity for this procedure in order to perform it in a safe and expeditious manner. Surgical assistant was necessary to retract the ligaments and vital neurovascular structures to prevent injury to them and also necessary for proper positioning of the limb to allow for anatomic placement of the prosthesis.   Rebecca Rankin Gayna Braddy, MD    01/22/2023, 11:50 AM

## 2023-01-22 NOTE — Anesthesia Procedure Notes (Signed)
Procedure Name: Intubation Date/Time: 01/22/2023 10:46 AM  Performed by: Florene Route, CRNAPre-anesthesia Checklist: Patient identified, Emergency Drugs available, Suction available and Patient being monitored Patient Re-evaluated:Patient Re-evaluated prior to induction Oxygen Delivery Method: Circle system utilized Preoxygenation: Pre-oxygenation with 100% oxygen Induction Type: IV induction Ventilation: Mask ventilation without difficulty and Oral airway inserted - appropriate to patient size Laryngoscope Size: Miller and 2 Tube type: Oral Tube size: 7.5 mm Number of attempts: 1 Airway Equipment and Method: Stylet Placement Confirmation: ETT inserted through vocal cords under direct vision, positive ETCO2 and breath sounds checked- equal and bilateral Secured at: 22 cm Tube secured with: Tape Dental Injury: Teeth and Oropharynx as per pre-operative assessment

## 2023-01-22 NOTE — Interval H&P Note (Signed)
History and Physical Interval Note:  01/22/2023 8:38 AM  Rebecca Brooks  has presented today for surgery, with the diagnosis of left knee osteoarthritis.  The various methods of treatment have been discussed with the patient and family. After consideration of risks, benefits and other options for treatment, the patient has consented to  Procedure(s): TOTAL KNEE ARTHROPLASTY (Left) as a surgical intervention.  The patient's history has been reviewed, patient examined, no change in status, stable for surgery.  I have reviewed the patient's chart and labs.  Questions were answered to the patient's satisfaction.     Homero Fellers Jeydi Klingel

## 2023-01-23 ENCOUNTER — Encounter (HOSPITAL_COMMUNITY): Payer: Self-pay | Admitting: Orthopedic Surgery

## 2023-01-23 DIAGNOSIS — Z8616 Personal history of COVID-19: Secondary | ICD-10-CM | POA: Diagnosis not present

## 2023-01-23 DIAGNOSIS — Z79899 Other long term (current) drug therapy: Secondary | ICD-10-CM | POA: Diagnosis not present

## 2023-01-23 DIAGNOSIS — I4891 Unspecified atrial fibrillation: Secondary | ICD-10-CM | POA: Diagnosis not present

## 2023-01-23 DIAGNOSIS — Z96641 Presence of right artificial hip joint: Secondary | ICD-10-CM | POA: Diagnosis not present

## 2023-01-23 DIAGNOSIS — M1712 Unilateral primary osteoarthritis, left knee: Secondary | ICD-10-CM | POA: Diagnosis not present

## 2023-01-23 DIAGNOSIS — Z7901 Long term (current) use of anticoagulants: Secondary | ICD-10-CM | POA: Diagnosis not present

## 2023-01-23 DIAGNOSIS — J45909 Unspecified asthma, uncomplicated: Secondary | ICD-10-CM | POA: Diagnosis not present

## 2023-01-23 DIAGNOSIS — Z85828 Personal history of other malignant neoplasm of skin: Secondary | ICD-10-CM | POA: Diagnosis not present

## 2023-01-23 DIAGNOSIS — Z87891 Personal history of nicotine dependence: Secondary | ICD-10-CM | POA: Diagnosis not present

## 2023-01-23 DIAGNOSIS — I10 Essential (primary) hypertension: Secondary | ICD-10-CM | POA: Diagnosis not present

## 2023-01-23 DIAGNOSIS — E039 Hypothyroidism, unspecified: Secondary | ICD-10-CM | POA: Diagnosis not present

## 2023-01-23 LAB — BASIC METABOLIC PANEL
Anion gap: 6 (ref 5–15)
BUN: 20 mg/dL (ref 8–23)
CO2: 25 mmol/L (ref 22–32)
Calcium: 8.2 mg/dL — ABNORMAL LOW (ref 8.9–10.3)
Chloride: 105 mmol/L (ref 98–111)
Creatinine, Ser: 0.71 mg/dL (ref 0.44–1.00)
GFR, Estimated: >60 mL/min (ref >60)
Glucose, Bld: 162 mg/dL — ABNORMAL HIGH (ref 70–99)
Potassium: 3.8 mmol/L (ref 3.5–5.1)
Sodium: 136 mmol/L (ref 135–145)

## 2023-01-23 LAB — CBC
HCT: 34.9 % — ABNORMAL LOW (ref 36.0–46.0)
Hemoglobin: 11.4 g/dL — ABNORMAL LOW (ref 12.0–15.0)
MCH: 31.2 pg (ref 26.0–34.0)
MCHC: 32.7 g/dL (ref 30.0–36.0)
MCV: 95.6 fL (ref 80.0–100.0)
Platelets: 267 10*3/uL (ref 150–400)
RBC: 3.65 MIL/uL — ABNORMAL LOW (ref 3.87–5.11)
RDW: 12.9 % (ref 11.5–15.5)
WBC: 13.8 10*3/uL — ABNORMAL HIGH (ref 4.0–10.5)
nRBC: 0 % (ref 0.0–0.2)

## 2023-01-23 MED ORDER — ALUM & MAG HYDROXIDE-SIMETH 200-200-20 MG/5ML PO SUSP
30.0000 mL | ORAL | Status: DC | PRN
  Administered 2023-01-23: 30 mL via ORAL
  Filled 2023-01-23: qty 30

## 2023-01-23 MED ORDER — METHOCARBAMOL 500 MG PO TABS: 500.0000 mg | ORAL_TABLET | Freq: Four times a day (QID) | ORAL | 0 refills | Status: DC | PRN

## 2023-01-23 MED ORDER — ONDANSETRON HCL 4 MG PO TABS: 4.0000 mg | ORAL_TABLET | Freq: Four times a day (QID) | ORAL | 0 refills | Status: AC | PRN

## 2023-01-23 MED ORDER — TRAMADOL HCL 50 MG PO TABS: 50.0000 mg | ORAL_TABLET | Freq: Four times a day (QID) | ORAL | 0 refills | Status: DC | PRN

## 2023-01-23 MED ORDER — OXYCODONE HCL 5 MG PO TABS: 5.0000 mg | ORAL_TABLET | Freq: Four times a day (QID) | ORAL | 0 refills | Status: DC | PRN

## 2023-01-23 NOTE — Progress Notes (Signed)
Subjective: 1 Day Post-Op Procedure(s) (LRB): LEFT TOTAL KNEE ARTHROPLASTY (Left) Patient reports pain as mild.   Patient seen in rounds by Dr. Lequita Halt. Patient is well, and has had no acute complaints or problems No issues overnight. Denies chest pain, SOB, or calf pain. Foley catheter removed this AM.  We will continue therapy today, ambulated 25' yesterday.   Objective: Vital signs in last 24 hours: Temp:  [97.5 F (36.4 C)-98.4 F (36.9 C)] 97.9 F (36.6 C) (07/02 0154) Pulse Rate:  [53-72] 64 (07/02 0154) Resp:  [11-18] 16 (07/02 0154) BP: (110-151)/(58-97) 116/64 (07/02 0154) SpO2:  [93 %-100 %] 97 % (07/02 0154) Weight:  [86.2 kg] 86.2 kg (07/01 0856)  Intake/Output from previous day:  Intake/Output Summary (Last 24 hours) at 01/23/2023 0819 Last data filed at 01/23/2023 0630 Gross per 24 hour  Intake 3083.52 ml  Output 1675 ml  Net 1408.52 ml     Intake/Output this shift: No intake/output data recorded.  Labs: Recent Labs    01/23/23 0330  HGB 11.4*   Recent Labs    01/23/23 0330  WBC 13.8*  RBC 3.65*  HCT 34.9*  PLT 267   Recent Labs    01/23/23 0330  NA 136  K 3.8  CL 105  CO2 25  BUN 20  CREATININE 0.71  GLUCOSE 162*  CALCIUM 8.2*   No results for input(s): "LABPT", "INR" in the last 72 hours.  Exam: General - Patient is Alert and Oriented Extremity - Neurologically intact Neurovascular intact Sensation intact distally Dorsiflexion/Plantar flexion intact Dressing - dressing C/D/I Motor Function - intact, moving foot and toes well on exam.   Past Medical History:  Diagnosis Date   Abnormal nuclear stress test    False positive. Cath 08/23/15 showed Angiographically normal coronary arteries   Anemia    Ankle fracture    Arthritis    Asthma    "asthmatic bronchitis"   Atrial fibrillation (HCC)    Back pain    Chicken pox as a child   Edema of both lower extremities    Essential hypertension 07/17/2011   ACEI d/c 06/2011 for  refractory cough> resolved    GERD (gastroesophageal reflux disease)    Hyperlipidemia    Hypertension    Hypothyroidism    Insomnia 11/01/2014   Insulin resistance    pt reports she was told this in the past, does not take any medication for this   Joint pain    Low back pain radiating to right leg    intermittent and occasional numbness of skin over right hip in certain positions   Measles as a child   Mumps as a child   Obesity    Osteoarthritis    Palpitation    Pneumonia    this October 2013 from which she has said inhailer   PONV (postoperative nausea and vomiting)    "violent vomiting after surgery"   Recurrent epistaxis 02/20/2012   RLS (restless legs syndrome) 11/01/2014   Skin cancer    Sleep apnea    cpap   SOB (shortness of breath) on exertion    Spinal stenosis    Vitamin B 12 deficiency    Vitamin D deficiency     Assessment/Plan: 1 Day Post-Op Procedure(s) (LRB): LEFT TOTAL KNEE ARTHROPLASTY (Left) Principal Problem:   OA (osteoarthritis) of knee Active Problems:   Primary osteoarthritis of left knee  Estimated body mass index is 34.75 kg/m as calculated from the following:   Height as of  this encounter: 5\' 2"  (1.575 m).   Weight as of this encounter: 86.2 kg. Advance diet Up with therapy D/C IV fluids   Patient's anticipated LOS is less than 2 midnights, meeting these requirements: - Lives within 1 hour of care - Has a competent adult at home to recover with post-op recover - NO history of  - Chronic pain requiring opioids  - Diabetes  - Coronary Artery Disease  - Heart failure  - Heart attack  - Stroke  - DVT/VTE  - Respiratory Failure/COPD  - Renal failure  - Anemia  - Advanced Liver disease  DVT Prophylaxis -  Eliquis Weight bearing as tolerated. Continue therapy.  Plan is to go Home after hospital stay. Possible discharge later today if progresses with therapy. She was deconditioned prior to surgery, so may require another  night. Is feeling well this AM however.  Scheduled for OPPT at [facility]. Follow-up in the office in 2 weeks.  The PDMP database was reviewed today prior to any opioid medications being prescribed to this patient.  Arther Abbott, PA-C Orthopedic Surgery [phone] 01/23/2023, 8:19 AM

## 2023-01-23 NOTE — Plan of Care (Signed)
  Problem: Education: Goal: Knowledge of the prescribed therapeutic regimen will improve Outcome: Progressing   Problem: Activity: Goal: Range of joint motion will improve Outcome: Progressing   Problem: Pain Management: Goal: Pain level will decrease with appropriate interventions Outcome: Progressing   Problem: Safety: Goal: Ability to remain free from injury will improve Outcome: Progressing   

## 2023-01-23 NOTE — TOC Transition Note (Signed)
Transition of Care [facility]) - CM/SW Discharge Note   Patient Details  Name: Rebecca Brooks MRN: [id number] Date of Birth: [birth year]  [facility]) CM/SW Contact:  Armanda Heritage, RN Phone Number: 01/23/2023, 10:01 AM   Clinical Narrative:     Patient plans to discharge home with outpatient services, youth RW delivered to bedside by [employer].  No further TOC needs identified at this time.  Final next level of care: Home/Self Care Barriers to Discharge: No Barriers Identified   Patient Goals and CMS Choice      Discharge Placement                         Discharge Plan and Services Additional resources added to the After Visit Summary for                  DME Arranged: [facility] youth DME Agency: [employer] Date DME Agency Contacted: 01/23/23 Time DME Agency Contacted: [phone]   HH Arranged: NA          Social Determinants of Health (SDOH) Interventions SDOH Screenings   Food Insecurity: No Food Insecurity (01/22/2023)  Housing: Low Risk  (01/22/2023)  Transportation Needs: No Transportation Needs (01/22/2023)  Utilities: Not At Risk (01/22/2023)  Alcohol Screen: Low Risk  (09/13/2022)  Depression (PHQ2-9): Low Risk  (10/31/2022)  Financial Resource Strain: Low Risk  (09/13/2022)  Physical Activity: Inactive (09/13/2022)  Social Connections: Socially Integrated (09/13/2022)  Stress: No Stress Concern Present (09/13/2022)  Tobacco Use: Medium Risk (01/22/2023)     Readmission Risk Interventions     No data to display

## 2023-01-23 NOTE — Progress Notes (Signed)
Physical Therapy Treatment Patient Details Name: Rebecca Brooks MRN: [id number] DOB: 08-Nov-1950 Today's Date: 01/23/2023   History of Present Illness 72 yo female presents to therapy s/p L TKA on 01/22/2023 due to failure of conservative measures. Pt PMH includes but is not limited to: gout, cardiac murmur, lumbar stenosis, depression, hyperglycemia, OSA, anemia, DDD of lumbar and cervical region, R THA, angina, HDL, HTN, and lumbar sx.    PT Comments  POD # 1 am session General Comments: AxO x 3 very sweet Lady who plans to D/C to home with Spouse and also Sister to help. Assisted OOB to amb in hallway with improvement.  General bed mobility comments: demonstarted and Educated how to use strap to self asisst LE.  Required increased time.  General transfer comment: cues for proper UE placement, mod A to complete power up and cues for extension posture once in standing.  General Gait Details: tolerated an increased distance with VC's on safety with turns and upright posture.  Spouse assisted by following with recliner.  Then returned to room to perform some TE's following HEP handout.  Instructed on proper tech, freq as well as use of ICE.   Will see pt again this afternoon to complete HEP Education and increase her mobility.  Pt has a RAMP, so no stair training is needed.     Assistance Recommended at Discharge Intermittent Supervision/Assistance  If plan is discharge home, recommend the following:  Can travel by private vehicle    A little help with walking and/or transfers;A little help with bathing/dressing/bathroom;Assistance with cooking/housework;Assist for transportation      Equipment Recommendations  None recommended by PT    Recommendations for Other Services       Precautions / Restrictions Precautions Precautions: Knee;Fall Precaution Comments: no pillow under knee Restrictions Weight Bearing Restrictions: No     Mobility  Bed Mobility Overal bed mobility: Needs  Assistance Bed Mobility: Supine to Sit     Supine to sit: Supervision, Min guard     General bed mobility comments: demonstarted and Educated how to use strap to self asisst LE.  Required increased time.    Transfers Overall transfer level: Needs assistance Equipment used: Rolling walker (2 wheels) Transfers: Sit to/from Stand Sit to Stand: Min guard, Min assist           General transfer comment: cues for proper UE placement, mod A to complete power up and cues for extension posture once in standing    Ambulation/Gait Ambulation/Gait assistance: Min guard, Supervision Gait Distance (Feet): 40 Feet Assistive device: Rolling walker (2 wheels) (youth) Gait Pattern/deviations: Step-to pattern, Decreased stance time - left Gait velocity: decreased     General Gait Details: tolerated an increased distance with VC's on safety with turns and upright posture.  Spouse assisted by following with recliner.   Stairs             Wheelchair Mobility     Tilt Bed    Modified Rankin (Stroke Patients Only)       Balance                                            Cognition Arousal/Alertness: Awake/alert Behavior During Therapy: WFL for tasks assessed/performed Overall Cognitive Status: Within Functional Limits for tasks assessed  General Comments: AxO x 3 very sweet Lady who plans to D/C to home with Spouse and also Sister to help.        Exercises  Total Knee Replacement TE's following HEP handout 10 reps B LE ankle pumps 05 reps towel squeezes 05 reps knee presses 05 reps heel slides  05 reps SLR's 05 reps ABD Educated on use of gait belt to assist with TE's Followed by ICE      General Comments        Pertinent Vitals/Pain Pain Assessment Pain Assessment: 0-10 Pain Score: 8  Pain Location: L knee Pain Descriptors / Indicators: Aching, Constant, Discomfort, Operative site  guarding Pain Intervention(s): Monitored during session, Premedicated before session, Repositioned, Ice applied    Home Living                          Prior Function            PT Goals (current goals can now be found in the care plan section) Progress towards PT goals: Progressing toward goals    Frequency    7X/week      PT Plan Current plan remains appropriate    Co-evaluation              AM-PAC PT "6 Clicks" Mobility   Outcome Measure  Help needed turning from your back to your side while in a flat bed without using bedrails?: A Little Help needed moving from lying on your back to sitting on the side of a flat bed without using bedrails?: A Little Help needed moving to and from a bed to a chair (including a wheelchair)?: A Little Help needed standing up from a chair using your arms (e.g., wheelchair or bedside chair)?: A Little Help needed to walk in hospital room?: A Little Help needed climbing 3-5 steps with a railing? : A Lot 6 Click Score: 17    End of Session Equipment Utilized During Treatment: Gait belt Activity Tolerance: Patient tolerated treatment well Patient left: in chair;with call bell/phone within reach;with chair alarm set;with family/visitor present Nurse Communication: Mobility status PT Visit Diagnosis: Unsteadiness on feet (R26.81);Other abnormalities of gait and mobility (R26.89);Muscle weakness (generalized) (M62.81);History of falling (Z91.81);Pain Pain - Right/Left: Left Pain - part of body: Knee     Time: 1610-9604 PT Time Calculation (min) (ACUTE ONLY): 45 min  Charges:    $Gait Training: 8-22 mins $Therapeutic Exercise: 8-22 mins $Therapeutic Activity: 8-22 mins PT General Charges $$ ACUTE PT VISIT: 1 Visit                    Felecia Shelling  PTA Acute  Rehabilitation Services Office M-F          [phone]

## 2023-01-23 NOTE — Progress Notes (Signed)
Pt set up on our CPAP machine using her home nasal mask and tubing.  Tolerating well.

## 2023-01-23 NOTE — Care Management Obs Status (Signed)
MEDICARE OBSERVATION STATUS NOTIFICATION   Patient Details  Name: Rebecca Brooks MRN: [id number] Date of Birth: 03-25-1951   Medicare Observation Status Notification Given:  Yes    Armanda Heritage, RN 01/23/2023, 9:55 AM

## 2023-01-23 NOTE — Progress Notes (Signed)
Physical Therapy Treatment Patient Details Name: Rebecca Brooks MRN: [id number] DOB: Sep 30, 1950 Today's Date: 01/23/2023   History of Present Illness 72 yo female presents to therapy s/p L TKA on 01/22/2023 due to failure of conservative measures. Pt PMH includes but is not limited to: gout, cardiac murmur, lumbar stenosis, depression, hyperglycemia, OSA, anemia, DDD of lumbar and cervical region, R THA, angina, HDL, HTN, and lumbar sx.    PT Comments  POD # 1 pm session with Spouse and Sister.   General Gait Details: tolerated an increased distance with VC's on safety with turns and upright posture.  Spouse "hands on" assisted using safety belt.  General transfer comment: increased time and one VC on safety with turns.  Also asissted with a toilet transfer. Then returned to room to perform some TE's following HEP handout.  Instructed on proper tech, freq as well as use of ICE.   Addressed all mobility questions, discussed appropriate activity, educated on use of ICE.  Pt ready for D/C to home.     Assistance Recommended at Discharge Intermittent Supervision/Assistance  If plan is discharge home, recommend the following:  Can travel by private vehicle    A little help with walking and/or transfers;A little help with bathing/dressing/bathroom;Assistance with cooking/housework;Assist for transportation      Equipment Recommendations  None recommended by PT    Recommendations for Other Services       Precautions / Restrictions       Mobility  Bed Mobility Overal bed mobility: Needs Assistance Bed Mobility: Supine to Sit     Supine to sit: Supervision, Min guard     General bed mobility comments: OOB in recliner    Transfers Overall transfer level: Needs assistance Equipment used: Rolling walker (2 wheels) Transfers: Sit to/from Stand Sit to Stand: Supervision           General transfer comment: increased time and one VC on safety with turns.  Also asissted with a  toilet transfer.    Ambulation/Gait Ambulation/Gait assistance: Supervision Gait Distance (Feet): 55 Feet Assistive device: Rolling walker (2 wheels) Gait Pattern/deviations: Step-to pattern, Decreased stance time - left Gait velocity: decreased     General Gait Details: tolerated an increased distance with VC's on safety with turns and upright posture.  Spouse "hands on" assisted using safety belt.   Stairs             Wheelchair Mobility     Tilt Bed    Modified Rankin (Stroke Patients Only)       Balance                                            Cognition Arousal/Alertness: Awake/alert Behavior During Therapy: WFL for tasks assessed/performed Overall Cognitive Status: Within Functional Limits for tasks assessed                                 General Comments: AxO x 3 very sweet Lady who plans to D/C to home with Spouse and also Sister to help.        Exercises   05 reps all seated TE's following HEP handout for TKR    General Comments        Pertinent Vitals/Pain Pain Assessment Pain Assessment: 0-10 Pain Score: 8  Pain Location: L knee Pain Descriptors /  Indicators: Aching, Constant, Discomfort, Operative site guarding Pain Intervention(s): Monitored during session, Premedicated before session, Repositioned, Ice applied    Home Living                          Prior Function            PT Goals (current goals can now be found in the care plan section) Progress towards PT goals: Progressing toward goals    Frequency    7X/week      PT Plan Current plan remains appropriate    Co-evaluation              AM-PAC PT "6 Clicks" Mobility   Outcome Measure  Help needed turning from your back to your side while in a flat bed without using bedrails?: A Little Help needed moving from lying on your back to sitting on the side of a flat bed without using bedrails?: A Little Help needed  moving to and from a bed to a chair (including a wheelchair)?: A Little Help needed standing up from a chair using your arms (e.g., wheelchair or bedside chair)?: A Little Help needed to walk in hospital room?: A Little Help needed climbing 3-5 steps with a railing? : A Little 6 Click Score: 18    End of Session Equipment Utilized During Treatment: Gait belt Activity Tolerance: Patient tolerated treatment well Patient left: in chair;with call bell/phone within reach;with chair alarm set;with family/visitor present Nurse Communication: Mobility status PT Visit Diagnosis: Unsteadiness on feet (R26.81);Other abnormalities of gait and mobility (R26.89);Muscle weakness (generalized) (M62.81);History of falling (Z91.81);Pain Pain - Right/Left: Left Pain - part of body: Knee     Time: 8119-1478 PT Time Calculation (min) (ACUTE ONLY): 34 min  Charges:    $Gait Training: 8-22 mins $Therapeutic Exercise: 8-22 mins PT General Charges $$ ACUTE PT VISIT: 1 Visit                     Felecia Shelling  PTA Acute  Rehabilitation Services Office M-F          [phone]

## 2023-01-24 NOTE — Discharge Summary (Signed)
[name] MRN: [id number] DOB/AGE: 73-Sep-1952 73 y.o.  Admit date: 01/22/2023 Discharge date: 01/23/2023  Admission Diagnoses:  Principal Problem:   OA (osteoarthritis) of knee Active Problems:   Primary osteoarthritis of left knee   Discharge Diagnoses:  Same  Past Medical History:  Diagnosis Date   Abnormal nuclear stress test    False positive. Cath 08/23/15 showed Angiographically normal coronary arteries   Anemia    Ankle fracture    Arthritis    Asthma    "asthmatic bronchitis"   Atrial fibrillation (HCC)    Back pain    Chicken pox as a child   Edema of both lower extremities    Essential hypertension 07/17/2011   ACEI d/c 06/2011 for refractory cough> resolved    GERD (gastroesophageal reflux disease)    Hyperlipidemia    Hypertension    Hypothyroidism    Insomnia 11/01/2014   Insulin resistance    pt reports she was told this in the past, does not take any medication for this   Joint pain    Low back pain radiating to right leg    intermittent and occasional numbness of skin over right hip in certain positions   Measles as a child   Mumps as a child   Obesity    Osteoarthritis    Palpitation    Pneumonia    this October 2013 from which she has said inhailer   PONV (postoperative nausea and vomiting)    "violent vomiting after surgery"   Recurrent epistaxis 02/20/2012   RLS (restless legs syndrome) 11/01/2014   Skin cancer    Sleep apnea    cpap   SOB (shortness of breath) on exertion    Spinal stenosis    Vitamin B 12 deficiency    Vitamin D deficiency     Surgeries: Procedure(s): LEFT TOTAL KNEE ARTHROPLASTY on 01/22/2023   Consultants:   Discharged Condition: Improved  Hospital Course: Maniyah Guymon is an 72 y.o. female who was admitted 01/22/2023 for operative treatment ofOA (osteoarthritis) of knee. Patient has severe unremitting pain that affects sleep, daily activities, and work/hobbies. After pre-op clearance the  patient was taken to the operating room on 01/22/2023 and underwent  Procedure(s): LEFT TOTAL KNEE ARTHROPLASTY.    Patient was given perioperative antibiotics:  Anti-infectives (From admission, onward)    Start     Dose/Rate Route Frequency Ordered Stop   01/22/23 1700  ceFAZolin (ANCEF) IVPB 2g/100 mL premix        2 g 200 mL/hr over 30 Minutes Intravenous Every 6 hours 01/22/23 1348 01/22/23 2345   01/22/23 0845  ceFAZolin (ANCEF) IVPB 2g/100 mL premix        2 g 200 mL/hr over 30 Minutes Intravenous On call to O.R. 01/22/23 0836 01/22/23 1059        Patient was given sequential compression devices, early ambulation, and chemoprophylaxis to prevent DVT.  Patient benefited maximally from hospital stay and there were no complications.    Recent vital signs: No data found.   Recent laboratory studies:  Recent Labs    01/23/23 0330  WBC 13.8*  HGB 11.4*  HCT 34.9*  PLT 267  NA 136  K 3.8  CL 105  CO2 25  BUN 20  CREATININE 0.71  GLUCOSE 162*  CALCIUM 8.2*     Discharge Medications:   Allergies as of 01/23/2023       Reactions   Bee Venom Anaphylaxis   Accupril [quinapril Hcl] Cough  Quinapril-hydrochlorothiazide Cough   Simvastatin Other (See Comments)   myalgias   Rosuvastatin Other (See Comments)   Leg aches.        Medication List     STOP taking these medications    cholestyramine 4 g packet Commonly known as: Questran   diclofenac Sodium 1 % Gel Commonly known as: VOLTAREN   zolpidem 10 MG tablet Commonly known as: AMBIEN       TAKE these medications    acetaminophen 650 MG CR tablet Commonly known as: TYLENOL Take 1,300 mg by mouth 2 (two) times daily.   albuterol 108 (90 Base) MCG/ACT inhaler Commonly known as: VENTOLIN HFA Inhale 1-2 puffs into the lungs every 6 (six) hours as needed for wheezing or shortness of breath.   benzonatate 200 MG capsule Commonly known as: TESSALON Take 1 capsule (200 mg total) by mouth 3 (three)  times daily as needed for cough.   cetirizine 10 MG tablet Commonly known as: ZYRTEC TAKE 1 TABLET(10 MG) BY MOUTH DAILY   diltiazem 240 MG 24 hr capsule Commonly known as: CARDIZEM CD Take 1 capsule (240 mg total) by mouth daily.   Eliquis 5 MG Tabs tablet Generic drug: apixaban Take 5 mg by mouth 2 (two) times daily.   famotidine 20 MG tablet Commonly known as: PEPCID TAKE 1 TABLET(20 MG) BY MOUTH AT BEDTIME What changed: See the new instructions.   fluticasone 50 MCG/ACT nasal spray Commonly known as: FLONASE SHAKE LIQUID AND USE 2 SPRAYS IN EACH NOSTRIL AT BEDTIME   furosemide 20 MG tablet Commonly known as: LASIX TAKE 1 TABLET BY MOUTH DAILY AS NEEDED FOR FLUID OR EDEMA( WEIGHT GAIN MORE THAN 3 POUNDS IN 24 HOURS)   ketorolac 0.5 % ophthalmic solution Commonly known as: ACULAR Place 1 drop into the left eye 4 (four) times daily. For 4 weeks   levothyroxine 100 MCG tablet Commonly known as: SYNTHROID TAKE 1 TABLET(100 MCG) BY MOUTH DAILY BEFORE BREAKFAST   methocarbamol 500 MG tablet Commonly known as: ROBAXIN Take 1 tablet (500 mg total) by mouth every 6 (six) hours as needed for muscle spasms.   metoprolol tartrate 50 MG tablet Commonly known as: LOPRESSOR TAKE 1 TABLET(50 MG) BY MOUTH TWICE DAILY   ondansetron 4 MG tablet Commonly known as: ZOFRAN Take 1 tablet (4 mg total) by mouth every 6 (six) hours as needed for nausea.   oxyCODONE 5 MG immediate release tablet Commonly known as: Oxy IR/ROXICODONE Take 1-2 tablets (5-10 mg total) by mouth every 6 (six) hours as needed for severe pain.   pantoprazole 40 MG tablet Commonly known as: PROTONIX TAKE 1 TABLET(40 MG) BY MOUTH DAILY   polyethylene glycol 17 g packet Commonly known as: MIRALAX / GLYCOLAX Take 17 g by mouth daily as needed for moderate constipation.   prednisoLONE acetate 1 % ophthalmic suspension Commonly known as: PRED FORTE Place 1 drop into the left eye See admin instructions. Instill  1 drop into the left eye 3 times daily for 1 week, then 2 times daily for 1 week, then 1 drop for a week then stop   rOPINIRole 1 MG tablet Commonly known as: REQUIP Take 1 mg by mouth See admin instructions. Take 1 mg in the evening as needed for restless legs   rOPINIRole 2 MG tablet Commonly known as: REQUIP Take 1 tablet (2 mg total) by mouth at bedtime.   traMADol 50 MG tablet Commonly known as: ULTRAM Take 1-2 tablets (50-100 mg total) by mouth every  6 (six) hours as needed for moderate pain. What changed:  how much to take when to take this   vitamin D3 25 MCG tablet Commonly known as: CHOLECALCIFEROL Take 1 tablet (1,000 Units total) by mouth daily.               Discharge Care Instructions  (From admission, onward)           Start     Ordered   01/23/23 0000  Weight bearing as tolerated        01/23/23 0823   01/23/23 0000  Change dressing       Comments: You may remove the bulky bandage (ACE wrap and gauze) two days after surgery. You will have an adhesive waterproof bandage underneath. Leave this in place until your first follow-up appointment.   01/23/23 4782            Diagnostic Studies: CT Abdomen Pelvis W Contrast  Result Date: 01/09/2023 CLINICAL DATA:  Indeterminate renal lesion. EXAM: CT ABDOMEN AND PELVIS WITH CONTRAST TECHNIQUE: Multidetector CT imaging of the abdomen and pelvis was performed using the standard protocol following bolus administration of intravenous contrast. RADIATION DOSE REDUCTION: This exam was performed according to the departmental dose-optimization program which includes automated exposure control, adjustment of the mA and/or kV according to patient size and/or use of iterative reconstruction technique. CONTRAST:  OMNIPAQUE IOHEXOL 300 MG/ML  SOLN COMPARISON:  None Available. FINDINGS: Lower chest: The visualized lung bases are clear. No intra-abdominal free air or free fluid. Hepatobiliary: The liver is  unremarkable. No biliary dilatation. The gallbladder is unremarkable. Pancreas: Unremarkable. No pancreatic ductal dilatation or surrounding inflammatory changes. Spleen: Normal in size without focal abnormality. Adrenals/Urinary Tract: The adrenal glands are unremarkable. Small bilateral parapelvic cysts noted. Subcentimeter left renal upper pole hypodense lesion is too small to characterize. No imaging follow-up. No suspicious renal lesion identified by CT. There is no hydronephrosis on either side. There is symmetric enhancement and excretion of contrast by both kidneys. The visualized ureters and urinary bladder appear unremarkable. Stomach/Bowel: Small hiatal hernia. Postsurgical changes of gastric sleeve. Mild sigmoid diverticulosis without active inflammatory changes. There is no bowel obstruction or active inflammation. The appendix is normal. Vascular/Lymphatic: Mild aortoiliac atherosclerotic disease. The IVC is unremarkable. No portal venous gas. There is no adenopathy. Reproductive: The uterus is anteverted and grossly unremarkable. No adnexal masses. Other: None Musculoskeletal: Osteopenia with degenerative changes of the spine. Total right hip arthroplasty. No acute osseous pathology. IMPRESSION: 1. No acute intra-abdominal or pelvic pathology. 2. No suspicious renal lesion by CT. 3. Mild sigmoid diverticulosis. No bowel obstruction. Normal appendix. 4.  Aortic Atherosclerosis (ICD10-I70.0). Electronically Signed   By: Elgie Collard M.D.   On: 01/09/2023 01:41   DG Abd 1 View  Result Date: 01/04/2023 CLINICAL DATA:  Diarrhea for 1 month. EXAM: ABDOMEN - 1 VIEW COMPARISON:  None Available. FINDINGS: The left upper quadrant and left lateral lower quadrant are not included on the current exam, limiting evaluation. Left upper quadrant surgical clips. Stool throughout the colon. Nonobstructed bowel-gas pattern. Relative paucity of small bowel gas. Lumbar spine degenerative changes. Right hip  arthroplasty. Punctate calcific density left lower hemiabdomen. Small renal stone not excluded. IMPRESSION: Stool throughout the colon as can be seen with constipation. Nonobstructed bowel-gas pattern. Small calcific density left lower hemiabdomen. Nephrolithiasis not excluded. Electronically Signed   By: Annia Belt M.D.   On: 01/04/2023 12:58    Disposition: Discharge disposition: 01-Home or Self Care  Discharge Instructions     Call MD / Call 911   Complete by: As directed    If you experience chest pain or shortness of breath, CALL 911 and be transported to the hospital emergency room.  If you develope a fever above 101 F, pus (white drainage) or increased drainage or redness at the wound, or calf pain, call your surgeon's office.   Change dressing   Complete by: As directed    You may remove the bulky bandage (ACE wrap and gauze) two days after surgery. You will have an adhesive waterproof bandage underneath. Leave this in place until your first follow-up appointment.   Constipation Prevention   Complete by: As directed    Drink plenty of fluids.  Prune juice may be helpful.  You may use a stool softener, such as Colace (over the counter) 100 mg twice a day.  Use MiraLax (over the counter) for constipation as needed.   Diet - low sodium heart healthy   Complete by: As directed    Do not put a pillow under the knee. Place it under the heel.   Complete by: As directed    Driving restrictions   Complete by: As directed    No driving for two weeks   Post-operative opioid taper instructions:   Complete by: As directed    POST-OPERATIVE OPIOID TAPER INSTRUCTIONS: It is important to wean off of your opioid medication as soon as possible. If you do not need pain medication after your surgery it is ok to stop day one. Opioids include: Codeine, Hydrocodone(Norco, Vicodin), Oxycodone(Percocet, oxycontin) and hydromorphone amongst others.  Long term and even short term use of opiods  can cause: Increased pain response Dependence Constipation Depression Respiratory depression And more.  Withdrawal symptoms can include Flu like symptoms Nausea, vomiting And more Techniques to manage these symptoms Hydrate well Eat regular healthy meals Stay active Use relaxation techniques(deep breathing, meditating, yoga) Do Not substitute Alcohol to help with tapering If you have been on opioids for less than two weeks and do not have pain than it is ok to stop all together.  Plan to wean off of opioids This plan should start within one week post op of your joint replacement. Maintain the same interval or time between taking each dose and first decrease the dose.  Cut the total daily intake of opioids by one tablet each day Next start to increase the time between doses. The last dose that should be eliminated is the evening dose.      TED hose   Complete by: As directed    Use stockings (TED hose) for three weeks on both leg(s).  You may remove them at night for sleeping.   Weight bearing as tolerated   Complete by: As directed         Follow-up Information     Aluisio, Homero Fellers, MD. Schedule an appointment as soon as possible for a visit in 2 week(s).   Specialty: Orthopedic Surgery Contact information: [address] [place] [address] [phone]                  Signed: Arther Abbott 01/24/2023, 12:16 PM

## 2023-01-26 DIAGNOSIS — M25662 Stiffness of left knee, not elsewhere classified: Secondary | ICD-10-CM | POA: Diagnosis not present

## 2023-01-26 DIAGNOSIS — M25562 Pain in left knee: Secondary | ICD-10-CM | POA: Diagnosis not present

## 2023-01-28 ENCOUNTER — Other Ambulatory Visit: Payer: Self-pay | Admitting: Family Medicine

## 2023-01-29 DIAGNOSIS — M25562 Pain in left knee: Secondary | ICD-10-CM | POA: Diagnosis not present

## 2023-01-29 DIAGNOSIS — M25662 Stiffness of left knee, not elsewhere classified: Secondary | ICD-10-CM | POA: Diagnosis not present

## 2023-01-30 ENCOUNTER — Ambulatory Visit: Payer: Medicare Other

## 2023-02-02 DIAGNOSIS — M25662 Stiffness of left knee, not elsewhere classified: Secondary | ICD-10-CM | POA: Diagnosis not present

## 2023-02-02 DIAGNOSIS — M25562 Pain in left knee: Secondary | ICD-10-CM | POA: Diagnosis not present

## 2023-02-05 DIAGNOSIS — M25662 Stiffness of left knee, not elsewhere classified: Secondary | ICD-10-CM | POA: Diagnosis not present

## 2023-02-05 DIAGNOSIS — M25562 Pain in left knee: Secondary | ICD-10-CM | POA: Diagnosis not present

## 2023-02-07 DIAGNOSIS — M25662 Stiffness of left knee, not elsewhere classified: Secondary | ICD-10-CM | POA: Diagnosis not present

## 2023-02-07 DIAGNOSIS — M25562 Pain in left knee: Secondary | ICD-10-CM | POA: Diagnosis not present

## 2023-02-09 DIAGNOSIS — M25662 Stiffness of left knee, not elsewhere classified: Secondary | ICD-10-CM | POA: Diagnosis not present

## 2023-02-09 DIAGNOSIS — M25562 Pain in left knee: Secondary | ICD-10-CM | POA: Diagnosis not present

## 2023-02-11 ENCOUNTER — Encounter: Payer: Self-pay | Admitting: Family Medicine

## 2023-02-12 MED ORDER — TRAMADOL HCL 50 MG PO TABS: 50.0000 mg | ORAL_TABLET | Freq: Four times a day (QID) | ORAL | 0 refills | Status: DC | PRN

## 2023-02-12 NOTE — Telephone Encounter (Signed)
/  Requesting: tramadol Contract:10/31/22 UDS/:10/31/22 Last Visit:10/31/22 Next Visit:05/01/23 Last Refill:01/23/23  Please Advise

## 2023-02-13 DIAGNOSIS — M25562 Pain in left knee: Secondary | ICD-10-CM | POA: Diagnosis not present

## 2023-02-13 DIAGNOSIS — M25662 Stiffness of left knee, not elsewhere classified: Secondary | ICD-10-CM | POA: Diagnosis not present

## 2023-02-15 DIAGNOSIS — M25562 Pain in left knee: Secondary | ICD-10-CM | POA: Diagnosis not present

## 2023-02-15 DIAGNOSIS — M25662 Stiffness of left knee, not elsewhere classified: Secondary | ICD-10-CM | POA: Diagnosis not present

## 2023-02-20 DIAGNOSIS — M25562 Pain in left knee: Secondary | ICD-10-CM | POA: Diagnosis not present

## 2023-02-20 DIAGNOSIS — M25662 Stiffness of left knee, not elsewhere classified: Secondary | ICD-10-CM | POA: Diagnosis not present

## 2023-02-22 DIAGNOSIS — M25562 Pain in left knee: Secondary | ICD-10-CM | POA: Diagnosis not present

## 2023-02-22 DIAGNOSIS — M25662 Stiffness of left knee, not elsewhere classified: Secondary | ICD-10-CM | POA: Diagnosis not present

## 2023-02-26 DIAGNOSIS — M25662 Stiffness of left knee, not elsewhere classified: Secondary | ICD-10-CM | POA: Diagnosis not present

## 2023-02-26 DIAGNOSIS — M25562 Pain in left knee: Secondary | ICD-10-CM | POA: Diagnosis not present

## 2023-02-27 DIAGNOSIS — Z5189 Encounter for other specified aftercare: Secondary | ICD-10-CM | POA: Diagnosis not present

## 2023-03-01 DIAGNOSIS — M25562 Pain in left knee: Secondary | ICD-10-CM | POA: Diagnosis not present

## 2023-03-01 DIAGNOSIS — M25662 Stiffness of left knee, not elsewhere classified: Secondary | ICD-10-CM | POA: Diagnosis not present

## 2023-03-02 ENCOUNTER — Telehealth: Payer: Self-pay | Admitting: Family Medicine

## 2023-03-02 ENCOUNTER — Other Ambulatory Visit: Payer: Self-pay | Admitting: Family

## 2023-03-02 DIAGNOSIS — H5201 Hypermetropia, right eye: Secondary | ICD-10-CM | POA: Diagnosis not present

## 2023-03-02 MED ORDER — TRAMADOL HCL 50 MG PO TABS: 50.0000 mg | ORAL_TABLET | Freq: Four times a day (QID) | ORAL | 0 refills | Status: DC | PRN

## 2023-03-02 NOTE — Telephone Encounter (Signed)
Spoke with pt, pt is aware and expressed understanding. Pt states she believes what happened is refill was sent from the refill that was sent in after surgery. Pt states she normally takes medication 3 times daily and usually gets 90 tablets a month.

## 2023-03-02 NOTE — Telephone Encounter (Signed)
Patient called to advise that her last Tramadol prescription was sent in incorrectly. She only got 40 days instead of 90 days worth. Pt said that she needs this for chronic pain mgmt. She takes them 3x/day. Please send new, updated prescription to [facility] on [address]. Pt is totally out now because of how she has to take the medication. Please advise when sent.

## 2023-03-05 ENCOUNTER — Other Ambulatory Visit: Payer: Self-pay | Admitting: Family Medicine

## 2023-03-05 MED ORDER — TRAMADOL HCL 50 MG PO TABS: 50.0000 mg | ORAL_TABLET | Freq: Four times a day (QID) | ORAL | Status: DC | PRN

## 2023-03-13 DIAGNOSIS — M25662 Stiffness of left knee, not elsewhere classified: Secondary | ICD-10-CM | POA: Diagnosis not present

## 2023-03-13 DIAGNOSIS — M25562 Pain in left knee: Secondary | ICD-10-CM | POA: Diagnosis not present

## 2023-03-15 DIAGNOSIS — M19011 Primary osteoarthritis, right shoulder: Secondary | ICD-10-CM | POA: Diagnosis not present

## 2023-03-16 ENCOUNTER — Encounter: Payer: Self-pay | Admitting: Family

## 2023-03-17 ENCOUNTER — Encounter: Payer: Self-pay | Admitting: Family Medicine

## 2023-03-18 ENCOUNTER — Other Ambulatory Visit: Payer: Self-pay | Admitting: Family Medicine

## 2023-03-19 ENCOUNTER — Other Ambulatory Visit: Payer: Self-pay | Admitting: Family Medicine

## 2023-03-19 ENCOUNTER — Ambulatory Visit: Payer: Medicare Other | Admitting: Nurse Practitioner

## 2023-03-19 MED ORDER — TRAMADOL HCL 50 MG PO TABS: 50.0000 mg | ORAL_TABLET | Freq: Four times a day (QID) | ORAL | 0 refills | Status: DC | PRN

## 2023-03-20 DIAGNOSIS — M25662 Stiffness of left knee, not elsewhere classified: Secondary | ICD-10-CM | POA: Diagnosis not present

## 2023-03-20 DIAGNOSIS — M25562 Pain in left knee: Secondary | ICD-10-CM | POA: Diagnosis not present

## 2023-03-21 NOTE — Progress Notes (Signed)
Office Visit Note  Patient: Rebecca Brooks             Date of Birth: [birth year]           MRN: [id number]             PCP: Bradd Canary, MD Referring: Bradd Canary, MD Visit Date: 04/03/2023 Occupation: @GUAROCC @  Subjective:  Pain in joints  History of Present Illness: Rebecca Brooks is a 72 y.o. female with osteoarthritis and degenerative disc disease.  She underwent left total knee replacement on January 22, 2023 by Dr. Despina Hick.  She is recovering gradually from it  She is a scheduled to have right total knee replacement on June 18, 2023.  She had right knee joint injections prior to her left total knee replacement.  She also had right shoulder joint injection due to ongoing pain.  She states she continues to have some pain and stiffness in her bilateral hands.  Her right CMC joint was extremely painful for a few days and then did not resolve.  She has been ambulating with the help of a cane which she holds in her left hand.  She has been experiencing itching on both upper and lower extremities.  She has been applying ice to her feet to help with the itching.     Activities of Daily Living:  Patient reports morning stiffness for 20 minutes.   Patient Denies nocturnal pain.  Difficulty dressing/grooming: Reports Difficulty climbing stairs: Reports Difficulty getting out of chair: Reports Difficulty using hands for taps, buttons, cutlery, and/or writing: Reports  Review of Systems  Constitutional:  Negative for fatigue.  HENT:  Negative for mouth dryness.   Eyes:  Negative for dryness.  Respiratory:  Negative for shortness of breath.   Cardiovascular:  Negative for chest pain and palpitations.  Gastrointestinal:  Negative for constipation and diarrhea.  Endocrine: Negative for increased urination.  Genitourinary:  Positive for involuntary urination. Negative for difficulty urinating.  Musculoskeletal:  Positive for joint pain, joint pain and morning stiffness.  Negative for myalgias and myalgias.  Skin:  Negative for color change, rash and sensitivity to sunlight.  Allergic/Immunologic: Negative for susceptible to infections.  Neurological:  Negative for dizziness.  Hematological:  Negative for swollen glands.  Psychiatric/Behavioral:  Negative for depressed mood and sleep disturbance. The patient is not nervous/anxious.     PMFS History:  Patient Active Problem List   Diagnosis Date Noted   OA (osteoarthritis) of knee 01/22/2023   Primary osteoarthritis of left knee 01/22/2023   Osteopenia 03/29/2022   Gout 03/29/2022   Murmur, cardiac 03/29/2022   Foot pain 03/29/2022   Lumbar stenosis with neurogenic claudication 10/21/2021   Depression 02/23/2021   Hyperglycemia 02/16/2020   History of COVID-19 10/16/2018   Atrophic vaginitis 05/14/2018   OSA (obstructive sleep apnea) 04/26/2018   Muscle cramps 01/02/2018   IDA (iron deficiency anemia) 12/22/2016   Primary osteoarthritis of both knees 10/03/2016   DDD (degenerative disc disease), cervical 10/03/2016   DDD (degenerative disc disease), lumbar 10/03/2016   History of total hip replacement, right 10/03/2016   History of anemia 10/03/2016   Chondromalacia 10/03/2016   Primary osteoarthritis of shoulder 10/03/2016   Primary osteoarthritis of both hands 10/03/2016   Abnormal nuclear stress test 08/23/2015   Globus sensation 08/13/2015   Encounter for screening mammogram for malignant neoplasm of breast 08/13/2015   Chest pain at rest    Chest pain with moderate risk of acute coronary syndrome  07/28/2015   Annual physical exam 07/11/2015   RLS (restless legs syndrome) 11/01/2014   Insomnia 11/01/2014   Thoracic back pain 01/12/2014   Allergic rhinitis 11/03/2013   Preventative health care 12/13/2012   Recurrent epistaxis 02/20/2012   Shoulder pain, bilateral 11/01/2011   Arthralgia 10/18/2011   Peripheral edema 09/10/2011   Asthma 08/19/2011   Palpitations 08/01/2011   Anemia  08/01/2011   Hypokalemia 08/01/2011   Elevated WBC count 08/01/2011   GERD (gastroesophageal reflux disease)    Low back pain radiating to right leg    Obesity (BMI 30-39.9) 07/17/2011   Hypothyroidism 07/17/2011   Hyperlipidemia 07/17/2011   Essential hypertension 07/17/2011    Past Medical History:  Diagnosis Date   Abnormal nuclear stress test    False positive. Cath 08/23/15 showed Angiographically normal coronary arteries   Anemia    Ankle fracture    Arthritis    Asthma    "asthmatic bronchitis"   Atrial fibrillation (HCC)    Back pain    Chicken pox as a child   Edema of both lower extremities    Essential hypertension 07/17/2011   ACEI d/c 06/2011 for refractory cough> resolved    GERD (gastroesophageal reflux disease)    Hyperlipidemia    Hypertension    Hypothyroidism    Insomnia 11/01/2014   Insulin resistance    pt reports she was told this in the past, does not take any medication for this   Joint pain    Low back pain radiating to right leg    intermittent and occasional numbness of skin over right hip in certain positions   Measles as a child   Mumps as a child   Obesity    Osteoarthritis    Palpitation    Pneumonia    this October 2013 from which she has said inhailer   PONV (postoperative nausea and vomiting)    "violent vomiting after surgery"   Recurrent epistaxis 02/20/2012   RLS (restless legs syndrome) 11/01/2014   Skin cancer    Sleep apnea    cpap   SOB (shortness of breath) on exertion    Spinal stenosis    Vitamin B 12 deficiency    Vitamin D deficiency     Family History  Problem Relation Age of Onset   Cancer Mother    Heart failure Mother    Lung cancer Mother 81       smoker   Heart Problems Mother        tachycardia   Hearing loss Mother    Anxiety disorder Mother        anxiety, claustrophobia   Obesity Mother    Osteoarthritis Father    Hyperlipidemia Father    Anuerysm Father        AAA   Sudden death Father     Hyperlipidemia Sister    Migraines Sister    Hypertension Sister    Heart Problems Sister        tachycardia   Colon cancer Maternal Grandmother    Heart attack Maternal Grandfather    Aneurysm Paternal Grandfather        abdominal   Heart disease Paternal Grandfather        AAA rupture, smoker   Past Surgical History:  Procedure Laterality Date   CARDIAC CATHETERIZATION N/A 08/23/2015   Procedure: Left Heart Cath and Coronary Angiography;  Surgeon: Tonny Bollman, MD;  Location: [facility] INVASIVE CV LAB;  Service: Cardiovascular;  Laterality: N/A;   CATARACT  EXTRACTION Bilateral    CESAREAN SECTION     X 3   GASTRECTOMY  12/2017   INGUINAL HERNIA REPAIR Right 72 yrs old   LUMBAR LAMINECTOMY/DECOMPRESSION MICRODISCECTOMY Bilateral 10/21/2021   Procedure: Laminectomy and Foraminotomy - bilateral - Lumbar Three-Lumbar Four - Lumbar Four- Lumbar Five;  Surgeon: Julio Sicks, MD;  Location: [facility] OR;  Service: Neurosurgery;  Laterality: Bilateral;   THYROIDECTOMY     total for benign tumor, Parathyroid spared   TONSILLECTOMY     TOTAL HIP ARTHROPLASTY Right 2006   secondary to congenital  hip defect   TOTAL KNEE ARTHROPLASTY Left 01/22/2023   Procedure: LEFT TOTAL KNEE ARTHROPLASTY;  Surgeon: Ollen Gross, MD;  Location: [facility] ORS;  Service: Orthopedics;  Laterality: Left;   Social History   Social History Narrative   Not on file   Immunization History  Administered Date(s) Administered   Fluad Quad(high Dose 65+) 04/19/2019, 06/27/2022   Influenza Split 07/19/2012   Influenza Whole 06/24/2011, 05/13/2013   Influenza, High Dose Seasonal PF 06/22/2017, 05/14/2018   Influenza,inj,Quad PF,6+ Mos 06/03/2016   Influenza-Unspecified 05/07/2014, 06/08/2015   PFIZER(Purple Top)SARS-COV-2 Vaccination 08/12/2019, 09/03/2019, 06/05/2020   Pneumococcal Conjugate-13 05/14/2018   Pneumococcal Polysaccharide-23 07/18/2011   Td 07/24/2005   Tdap 08/01/2011   Zoster, Live 07/23/2012      Objective: Vital Signs: BP 137/74 (BP Location: Left Arm, Patient Position: Sitting, Cuff Size: Normal)   Pulse (!) 59    Physical Exam Vitals and nursing note reviewed.  Constitutional:      Appearance: She is well-developed.  HENT:     Head: Normocephalic and atraumatic.  Eyes:     Conjunctiva/sclera: Conjunctivae normal.  Cardiovascular:     Rate and Rhythm: Normal rate and regular rhythm.     Heart sounds: Normal heart sounds.  Pulmonary:     Effort: Pulmonary effort is normal.     Breath sounds: Normal breath sounds.  Abdominal:     General: Bowel sounds are normal.     Palpations: Abdomen is soft.  Musculoskeletal:     Cervical back: Normal range of motion.  Lymphadenopathy:     Cervical: No cervical adenopathy.  Skin:    General: Skin is warm and dry.     Capillary Refill: Capillary refill takes less than 2 seconds.  Neurological:     Mental Status: She is alert and oriented to person, place, and time.  Psychiatric:        Behavior: Behavior normal.      Musculoskeletal Exam: Limited lateral rotation of the cervical spine was noted.  Thoracic kyphosis was noted.  She had no point tenderness over thoracic and lumbar spine.  Right shoulder joint abduction was limited to 90 degrees.  She had limited internal rotation.  Left shoulder joint was in full range of motion.  Elbow joints and wrist joints were in good range of motion.  She had bilateral CMC thickening and subluxation.  Bilateral PIP and DIP thickening with no synovitis was noted.  Hip joints were in good range of motion.  Right hip joint was replaced.  Left hip joint was replaced.  She has some warmth on palpation of her left hip joint.  Right knee joint was in good range of motion.  She had edema on her left lower extremity from recent surgery.  There was no tenderness over ankles or MTPs.  CDAI Exam: CDAI Score: -- Patient Global: --; Provider Global: -- Swollen: --; Tender: -- Joint Exam 04/03/2023   No  joint  exam has been documented for this visit   There is currently no information documented on the homunculus. Go to the Rheumatology activity and complete the homunculus joint exam.  Investigation: No additional findings.  Imaging: No results found.  Recent Labs: Lab Results  Component Value Date   WBC 13.8 (H) 01/23/2023   HGB 11.4 (L) 01/23/2023   PLT 267 01/23/2023   NA 136 01/23/2023   K 3.8 01/23/2023   CL 105 01/23/2023   CO2 25 01/23/2023   GLUCOSE 162 (H) 01/23/2023   BUN 20 01/23/2023   CREATININE 0.71 01/23/2023   BILITOT 0.6 12/04/2022   ALKPHOS 80 12/04/2022   AST 15 12/04/2022   ALT 18 12/04/2022   PROT 6.6 12/04/2022   ALBUMIN 3.8 12/04/2022   CALCIUM 8.2 (L) 01/23/2023   GFRAA >60 12/21/2017    Speciality Comments: No specialty comments available.  Procedures:  No procedures performed Allergies: Bee venom, Accupril [quinapril hcl], Quinapril-hydrochlorothiazide, Simvastatin, and Rosuvastatin   Assessment / Plan:     Visit Diagnoses: Primary osteoarthritis of both shoulders - Followed by Dr. Lucie Leather at [facility].  She had right shoulder joint injection few months back.  She continues to have discomfort and limited range of motion of her right shoulder joint.  Patient states she was advised shoulder joint replacement in the past.  Primary osteoarthritis of both hands-she has severe osteoarthritis involving bilateral hands between [facility] PIP and DIP joints.  She states few days back she started having increased pain in her right [facility] joint which resolved after 3 days.  No swelling was noted.  Use of CMC brace was advised.  History of total hip replacement, right-she had good range of motion without discomfort.  Primary osteoarthritis of both knees - Bilateral knee joint injections on December 28, 2021 here and then in October 2023 by Dr. Madelon Lips.  She had bilateral knee joint injections in February 2024 and then again right knee joint cortisone  injection in June 2024.  She had left total knee replacement and is gradually recovering from it.  She states her right knee joint will be replaced in November 2024.  Status post total knee replacement, left - January 22, 2023 by Dr. Despina Hick.  She is ambulating with the help of a cane and gradually recovering from it.  She still have some left ankle edema.  Closed nondisplaced fracture of body of left talus, sequela-she has some joint thickening but no discomfort.  DDD (degenerative disc disease), cervical-she had limited lateral rotation.  DDD (degenerative disc disease), lumbar - S/p laminectomy, decompression and microdiscectomy by Dr. Dutch Quint in March 2023.  She continues to have intermittent discomfort.  Osteopenia of multiple sites - September 19, 2022 the BMD measured at [facility] is 0.778 g/cm2 with a T-score of -1.9.  Use of calcium rich diet with vitamin D was advised.  Other medical problems are listed as follows:  History of hypertension  History of high cholesterol  Asthmatic bronchitis , chronic  History of hypothyroidism  Primary insomnia - Ambien 10 mg p.o. nightly as needed insomnia.  Peripheral edema  History of anemia  BMI 39.0-39.9,adult  Orders: No orders of the defined types were placed in this encounter.  No orders of the defined types were placed in this encounter.   Follow-Up Instructions: Return in about 1 year (around 04/02/2024) for Osteoarthritis.   Pollyann Savoy, MD  Note - This record has been created using Animal nutritionist.  Chart creation errors have been sought, but  may not always  have been located. Such creation errors do not reflect on  the standard of medical care.

## 2023-03-28 DIAGNOSIS — M25662 Stiffness of left knee, not elsewhere classified: Secondary | ICD-10-CM | POA: Diagnosis not present

## 2023-03-28 DIAGNOSIS — M25562 Pain in left knee: Secondary | ICD-10-CM | POA: Diagnosis not present

## 2023-04-03 ENCOUNTER — Ambulatory Visit: Payer: Medicare Other | Attending: Rheumatology | Admitting: Rheumatology

## 2023-04-03 ENCOUNTER — Encounter: Payer: Self-pay | Admitting: Rheumatology

## 2023-04-03 VITALS — BP 137/74 | HR 59 | Resp 16 | Ht 61.75 in | Wt 193.0 lb

## 2023-04-03 DIAGNOSIS — Z8679 Personal history of other diseases of the circulatory system: Secondary | ICD-10-CM

## 2023-04-03 DIAGNOSIS — Z6839 Body mass index (BMI) 39.0-39.9, adult: Secondary | ICD-10-CM

## 2023-04-03 DIAGNOSIS — M19041 Primary osteoarthritis, right hand: Secondary | ICD-10-CM | POA: Diagnosis not present

## 2023-04-03 DIAGNOSIS — M5030 Other cervical disc degeneration, unspecified cervical region: Secondary | ICD-10-CM

## 2023-04-03 DIAGNOSIS — Z8639 Personal history of other endocrine, nutritional and metabolic disease: Secondary | ICD-10-CM

## 2023-04-03 DIAGNOSIS — M19012 Primary osteoarthritis, left shoulder: Secondary | ICD-10-CM

## 2023-04-03 DIAGNOSIS — Z96652 Presence of left artificial knee joint: Secondary | ICD-10-CM

## 2023-04-03 DIAGNOSIS — R600 Localized edema: Secondary | ICD-10-CM

## 2023-04-03 DIAGNOSIS — M19042 Primary osteoarthritis, left hand: Secondary | ICD-10-CM

## 2023-04-03 DIAGNOSIS — M19011 Primary osteoarthritis, right shoulder: Secondary | ICD-10-CM

## 2023-04-03 DIAGNOSIS — M8589 Other specified disorders of bone density and structure, multiple sites: Secondary | ICD-10-CM

## 2023-04-03 DIAGNOSIS — Z96641 Presence of right artificial hip joint: Secondary | ICD-10-CM

## 2023-04-03 DIAGNOSIS — M51369 Other intervertebral disc degeneration, lumbar region without mention of lumbar back pain or lower extremity pain: Secondary | ICD-10-CM

## 2023-04-03 DIAGNOSIS — S92125S Nondisplaced fracture of body of left talus, sequela: Secondary | ICD-10-CM

## 2023-04-03 DIAGNOSIS — M170 Bilateral primary osteoarthritis of knee: Secondary | ICD-10-CM

## 2023-04-03 DIAGNOSIS — M5136 Other intervertebral disc degeneration, lumbar region: Secondary | ICD-10-CM

## 2023-04-03 DIAGNOSIS — J4489 Other specified chronic obstructive pulmonary disease: Secondary | ICD-10-CM

## 2023-04-03 DIAGNOSIS — Z862 Personal history of diseases of the blood and blood-forming organs and certain disorders involving the immune mechanism: Secondary | ICD-10-CM

## 2023-04-03 DIAGNOSIS — F5101 Primary insomnia: Secondary | ICD-10-CM

## 2023-04-03 NOTE — Patient Instructions (Signed)

## 2023-04-06 DIAGNOSIS — M25662 Stiffness of left knee, not elsewhere classified: Secondary | ICD-10-CM | POA: Diagnosis not present

## 2023-04-06 DIAGNOSIS — M25562 Pain in left knee: Secondary | ICD-10-CM | POA: Diagnosis not present

## 2023-04-18 ENCOUNTER — Telehealth: Payer: Self-pay

## 2023-04-18 ENCOUNTER — Other Ambulatory Visit: Payer: Self-pay | Admitting: Family Medicine

## 2023-04-18 ENCOUNTER — Other Ambulatory Visit: Payer: Self-pay

## 2023-04-18 DIAGNOSIS — Z1211 Encounter for screening for malignant neoplasm of colon: Secondary | ICD-10-CM

## 2023-04-18 NOTE — Telephone Encounter (Signed)
Called pt regarding Color guard and she agree to [place]  Mailing it to her. Color guard ordered.

## 2023-04-20 DIAGNOSIS — M25662 Stiffness of left knee, not elsewhere classified: Secondary | ICD-10-CM | POA: Diagnosis not present

## 2023-04-20 DIAGNOSIS — M25562 Pain in left knee: Secondary | ICD-10-CM | POA: Diagnosis not present

## 2023-04-27 ENCOUNTER — Other Ambulatory Visit: Payer: Self-pay | Admitting: Family Medicine

## 2023-04-27 DIAGNOSIS — Z1211 Encounter for screening for malignant neoplasm of colon: Secondary | ICD-10-CM

## 2023-04-27 DIAGNOSIS — Z1212 Encounter for screening for malignant neoplasm of rectum: Secondary | ICD-10-CM

## 2023-04-29 NOTE — Assessment & Plan Note (Signed)
Well controlled, no changes to meds. Encouraged heart healthy diet such as the DASH diet and exercise as tolerated.  °

## 2023-04-29 NOTE — Assessment & Plan Note (Signed)
Encourage heart healthy diet such as MIND or DASH diet, increase exercise, avoid trans fats, simple carbohydrates and processed foods, consider a krill or fish or flaxseed oil cap daily. Tolerating Simvastatin 

## 2023-04-29 NOTE — Assessment & Plan Note (Signed)
Hydrate and monitor 

## 2023-04-29 NOTE — Assessment & Plan Note (Addendum)
Encouraged DASH or MIND diet, decrease po intake and increase exercise as tolerated. Needs 7-8 hours of sleep nightly. Avoid trans fats, eat small, frequent meals every 4-5 hours with lean proteins, complex carbs and healthy fats. Minimize simple carbs, high fat foods and processed foods 

## 2023-04-29 NOTE — Assessment & Plan Note (Signed)
Scheduled for TKR next month with Dr Despina Hick

## 2023-04-29 NOTE — Assessment & Plan Note (Signed)
Hydrate and monitor labs 

## 2023-04-29 NOTE — Assessment & Plan Note (Signed)
Increase leafy greens, consider increased lean red meat and using cast iron cookware. Continue to monitor, report any concerns. Monitor with CBC

## 2023-04-29 NOTE — Assessment & Plan Note (Signed)
hgba1c acceptable, minimize simple carbs. Increase exercise as tolerated.  

## 2023-04-29 NOTE — Assessment & Plan Note (Signed)
On Levothyroxine, continue to monitor 

## 2023-05-01 ENCOUNTER — Ambulatory Visit (INDEPENDENT_AMBULATORY_CARE_PROVIDER_SITE_OTHER): Payer: Medicare Other | Admitting: Family Medicine

## 2023-05-01 VITALS — BP 130/72 | HR 64 | Temp 98.0°F | Resp 16 | Ht 61.0 in | Wt 192.6 lb

## 2023-05-01 DIAGNOSIS — E039 Hypothyroidism, unspecified: Secondary | ICD-10-CM

## 2023-05-01 DIAGNOSIS — E782 Mixed hyperlipidemia: Secondary | ICD-10-CM

## 2023-05-01 DIAGNOSIS — M179 Osteoarthritis of knee, unspecified: Secondary | ICD-10-CM

## 2023-05-01 DIAGNOSIS — M109 Gout, unspecified: Secondary | ICD-10-CM | POA: Diagnosis not present

## 2023-05-01 DIAGNOSIS — R252 Cramp and spasm: Secondary | ICD-10-CM

## 2023-05-01 DIAGNOSIS — I10 Essential (primary) hypertension: Secondary | ICD-10-CM | POA: Diagnosis not present

## 2023-05-01 DIAGNOSIS — R739 Hyperglycemia, unspecified: Secondary | ICD-10-CM | POA: Diagnosis not present

## 2023-05-01 DIAGNOSIS — E669 Obesity, unspecified: Secondary | ICD-10-CM

## 2023-05-01 DIAGNOSIS — Z23 Encounter for immunization: Secondary | ICD-10-CM | POA: Diagnosis not present

## 2023-05-01 DIAGNOSIS — D508 Other iron deficiency anemias: Secondary | ICD-10-CM

## 2023-05-01 DIAGNOSIS — Z1211 Encounter for screening for malignant neoplasm of colon: Secondary | ICD-10-CM | POA: Diagnosis not present

## 2023-05-01 LAB — CBC WITH DIFFERENTIAL/PLATELET
Basophils Absolute: 0.1 10*3/uL (ref 0.0–0.1)
Basophils Relative: 0.7 % (ref 0.0–3.0)
Eosinophils Absolute: 0.1 10*3/uL (ref 0.0–0.7)
Eosinophils Relative: 1.2 % (ref 0.0–5.0)
HCT: 40 % (ref 36.0–46.0)
Hemoglobin: 13.1 g/dL (ref 12.0–15.0)
Lymphocytes Relative: 17.8 % (ref 12.0–46.0)
Lymphs Abs: 1.3 10*3/uL (ref 0.7–4.0)
MCHC: 32.8 g/dL (ref 30.0–36.0)
MCV: 93.9 fL (ref 78.0–100.0)
Monocytes Absolute: 0.5 10*3/uL (ref 0.1–1.0)
Monocytes Relative: 6.4 % (ref 3.0–12.0)
Neutro Abs: 5.5 10*3/uL (ref 1.4–7.7)
Neutrophils Relative %: 73.9 % (ref 43.0–77.0)
Platelets: 296 10*3/uL (ref 150.0–400.0)
RBC: 4.26 Mil/uL (ref 3.87–5.11)
RDW: 13 % (ref 11.5–15.5)
WBC: 7.4 10*3/uL (ref 4.0–10.5)

## 2023-05-01 LAB — TSH: TSH: 0.79 u[IU]/mL (ref 0.35–5.50)

## 2023-05-01 LAB — HEMOGLOBIN A1C: Hgb A1c MFr Bld: 5.3 % (ref 4.6–6.5)

## 2023-05-01 NOTE — Patient Instructions (Signed)
Flu and Arexvy together today  Then Shingrix is the new shingles shot, 2 shots over 2-6 months, confirm coverage with insurance and document, then can return here for shots with nurse appt or at pharmacy take around November first ideally with tetanus shot that contains Pertussis

## 2023-05-02 LAB — COMPREHENSIVE METABOLIC PANEL
ALT: 19 U/L (ref 0–35)
AST: 14 U/L (ref 0–37)
Albumin: 3.8 g/dL (ref 3.5–5.2)
Alkaline Phosphatase: 75 U/L (ref 39–117)
BUN: 21 mg/dL (ref 6–23)
CO2: 31 meq/L (ref 19–32)
Calcium: 9.4 mg/dL (ref 8.4–10.5)
Chloride: 102 meq/L (ref 96–112)
Creatinine, Ser: 0.64 mg/dL (ref 0.40–1.20)
GFR: 88.03 mL/min (ref >60.00)
Glucose, Bld: 92 mg/dL (ref 70–99)
Potassium: 4.1 meq/L (ref 3.5–5.1)
Sodium: 140 meq/L (ref 135–145)
Total Bilirubin: 0.6 mg/dL (ref 0.2–1.2)
Total Protein: 5.5 g/dL — ABNORMAL LOW (ref 6.0–8.3)

## 2023-05-02 LAB — LIPID PANEL
Cholesterol: 231 mg/dL — ABNORMAL HIGH (ref 0–200)
HDL: 70.2 mg/dL (ref >39.00)
LDL Cholesterol: 144 mg/dL — ABNORMAL HIGH (ref 0–99)
NonHDL: 160.53
Total CHOL/HDL Ratio: 3
Triglycerides: 81 mg/dL (ref 0.0–149.0)
VLDL: 16.2 mg/dL (ref 0.0–40.0)

## 2023-05-02 LAB — URIC ACID: Uric Acid, Serum: 3.8 mg/dL (ref 2.4–7.0)

## 2023-05-02 LAB — MAGNESIUM: Magnesium: 1.7 mg/dL (ref 1.5–2.5)
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
## 2023-05-04 ENCOUNTER — Other Ambulatory Visit: Payer: Self-pay | Admitting: Cardiology

## 2023-05-04 LAB — IRON,TIBC AND FERRITIN PANEL
%SAT: 38 % (ref 16–45)
Ferritin: 112 ng/mL (ref 16–288)
Iron: 107 ug/dL (ref 45–160)
TIBC: 284 ug/dL (ref 250–450)

## 2023-05-04 LAB — VITAMIN D 1,25 DIHYDROXY
Vitamin D 1, 25 (OH)2 Total: 62 pg/mL (ref 18–72)
Vitamin D2 1, 25 (OH)2: <8 pg/mL
Vitamin D3 1, 25 (OH)2: 62 pg/mL

## 2023-05-06 LAB — COLOGUARD: COLOGUARD: NEGATIVE

## 2023-05-09 ENCOUNTER — Encounter: Payer: Self-pay | Admitting: Family Medicine

## 2023-05-09 ENCOUNTER — Encounter: Payer: Self-pay | Admitting: Cardiovascular Disease

## 2023-05-09 ENCOUNTER — Telehealth: Payer: Self-pay | Admitting: Cardiovascular Disease

## 2023-05-09 DIAGNOSIS — I480 Paroxysmal atrial fibrillation: Secondary | ICD-10-CM

## 2023-05-09 MED ORDER — DILTIAZEM HCL ER COATED BEADS 240 MG PO CP24: 240.0000 mg | ORAL_CAPSULE | Freq: Every day | ORAL | 3 refills | Status: DC

## 2023-05-09 MED ORDER — APIXABAN 5 MG PO TABS: 5.0000 mg | ORAL_TABLET | Freq: Two times a day (BID) | ORAL | Status: DC

## 2023-05-09 NOTE — Telephone Encounter (Signed)
Pt reports having Afib around 0330 when she got up to the bathroom. (Currently on Eliquis) She was able to sleep on and off after that. At 0903= 140's and now it is in 70's in NSR.   She reports that she is in Normal Sinus Rhythm at this time according to her I-watch.  Her husband brought her a syringe to blow into- blow into large end- and she went back into NSR. She will send her strips through MyChart after we hang up  About a year ago she had A-fib/diagnosed and has been on diltiazem and Eliquis ever since.   (She states that she may have missed her dose of diltiazem yesterday and she has also has had a lot of stress- her friend's daughter committed suicide and also had a birth in the family and keeping 14 year old grand-daughter)  The patient was not short of breath or dizzy during this incident. She also reports that her and her husband are in the donut hole with Eliquis. Cannot afford but to fill one prescription at a time- they are basically sharing/using one person's prescription at a time.    She requested some samples of Eliquis- costing them around $300+ per prescription.  Informed that I would send this info to provider for any recommendations and send request for samples as well.  Given ER precautions. She verbalized understanding.

## 2023-05-09 NOTE — Telephone Encounter (Signed)
Can give 1 or 2 weeks of Eliquis samples but this is not a long term solution. If she does not qualify for Eliquis pt assistance and copay is cost prohibitive, would need to change to alternative anticoagulation.  Pharmacist   Would encourage her to take her mediations. The diltiazem should be inexpensive. Maybe she can apply for assistance?  Let's set her up with Afib clinic to evaluate Afib burden.   Gerri Spore T. Flora Lipps, MD, [facility]    Donut hole Options include:  Eliquis: Can apply for patient assistance through [employer]. The website for the application is http://[email or web address]-[email or web address] The website lets them know if they meet the income requirement. Note to Medicare Patients: In addition to the application, they need to submit documentation showing they have spent 3% of their annual household income on out-of-pocket prescription expenses for them and/or other members of their household. Their pharmacy can provide this report.  Xarelto: Similar to Eliquis, but it is taken just once a day. The drug company offers a program called Xarelto with Me Coverage Gap Support. It runs from April 1-December 31. Patients in the coverage gap can get Xarelto for $89 (for 30 days) or $250 (for 90 days). They can call to enroll at: 888-XARELTO (619) 822-2486)  Dabigatran: Generic of Pradaxa. GoodRx coupon bypasses insurance and brings cost down to ~$70/month if insurance doesn't cover it at a better rate.  Warfarin: Generic and cheap alternative, however it requires frequent in office lab monitoring. It's less effective than the above options and has more food and drug interactions. There may be a copay associated with lab monitoring appts.    Spoke to the patient- She is pretty sure she missed a dose of her diltiazem- on accident. Sent in a refill for this medication to her pharmacy. She will pick it up today. She does not need pt assistance for diltiazem. She feels that she had Afib due to her missing medication dose, not using cpap,  and stress. She does not wish to go to the Afib Clinic yet- she would like to monitor it for a bit longer. She will let us know if this happens again and will send in strips. If it continues to happen, she will plan on going to Afib clinic.   Told her that we can give her 2 weeks worth of samples. She was told by her pharmacy that her next Eliquis prescription would be less expensive. I told her about the donut hole options. She plans to ask her pharmacy how much her next Eliquis prescription will cost and will let us know if she would like to switch to another medication.   She will pick up samples today or tomorrow. Given ER precautions. She verbalized understanding.

## 2023-05-09 NOTE — Telephone Encounter (Signed)
Can give 1 or 2 weeks of Eliquis samples but this is not a long term solution. If she does not qualify for Eliquis pt assistance and copay is cost prohibitive, would need to change to alternative anticoagulation.

## 2023-05-09 NOTE — Telephone Encounter (Signed)
Patient c/o Palpitations:  High priority if patient c/o lightheadedness, shortness of breath, or chest pain  How long have you had palpitations/irregular HR/ Afib? Are you having the symptoms now? yes  Are you currently experiencing lightheadedness, SOB or CP? SOB and CP (tightness)  Do you have a history of afib (atrial fibrillation) or irregular heart rhythm? Yes   Have you checked your BP or HR? (document readings if available): nol  Are you experiencing any other symptoms? Stomach crapping

## 2023-05-09 NOTE — Addendum Note (Signed)
Addended by: Scheryl Marten on: 05/09/2023 01:38 PM   Modules accepted: Orders

## 2023-05-10 ENCOUNTER — Other Ambulatory Visit: Payer: Self-pay | Admitting: Family

## 2023-05-10 MED ORDER — TRAMADOL HCL 50 MG PO TABS: 50.0000 mg | ORAL_TABLET | Freq: Four times a day (QID) | ORAL | 0 refills | Status: DC | PRN

## 2023-05-10 NOTE — Telephone Encounter (Signed)
Requesting: tramadol 50mg   Contract: 10/31/22 UDS: 10/31/22 Last Visit: 05/01/23 Next Visit: 11/06/23 Last Refill: 03/19/23 #90 and 0RF   Please Advise

## 2023-05-20 ENCOUNTER — Other Ambulatory Visit: Payer: Self-pay | Admitting: Cardiology

## 2023-05-21 NOTE — Telephone Encounter (Signed)
Prescription refill request for Eliquis received. Indication:afib Last office visit:6/24 Scr:0.64  10/24 Age: 72 Weight:87.4  kg  Prescription refilled

## 2023-05-22 DIAGNOSIS — L820 Inflamed seborrheic keratosis: Secondary | ICD-10-CM | POA: Diagnosis not present

## 2023-05-22 DIAGNOSIS — Z411 Encounter for cosmetic surgery: Secondary | ICD-10-CM | POA: Diagnosis not present

## 2023-05-22 DIAGNOSIS — L578 Other skin changes due to chronic exposure to nonionizing radiation: Secondary | ICD-10-CM | POA: Diagnosis not present

## 2023-05-23 NOTE — Telephone Encounter (Signed)
Amb ref afib clinic placed

## 2023-05-23 NOTE — Addendum Note (Signed)
Addended by: Shade Flood R on: 05/23/2023 11:45 AM   Modules accepted: Orders

## 2023-05-30 NOTE — H&P (Signed)
TOTAL KNEE ADMISSION H&P  Patient is being admitted for right total knee arthroplasty.  Subjective:  Chief Complaint: Right knee pain.  HPI: Rebecca Brooks, 72 y.o. female has a history of pain and functional disability in the right knee due to arthritis and has failed non-surgical conservative treatments for greater than 12 weeks to include corticosteriod injections and activity modification. Onset of symptoms was gradual, starting  several  years ago with gradually worsening course since that time. The patient noted no past surgery on the right knee.  Patient currently rates pain in the right knee at 7 out of 10 with activity. Patient has night pain, worsening of pain with activity and weight bearing, pain with passive range of motion, and crepitus. Patient has evidence of  severe end-stage osteoarthritis of both knees. On the right side, there are tricompartmental bone-on-bone changes  by imaging studies. There is no active infection.  Patient Active Problem List   Diagnosis Date Noted   OA (osteoarthritis) of knee 01/22/2023   Primary osteoarthritis of left knee 01/22/2023   Osteopenia 03/29/2022   Gout 03/29/2022   Murmur, cardiac 03/29/2022   Foot pain 03/29/2022   Lumbar stenosis with neurogenic claudication 10/21/2021   Depression 02/23/2021   Hyperglycemia 02/16/2020   History of COVID-19 10/16/2018   Atrophic vaginitis 05/14/2018   OSA (obstructive sleep apnea) 04/26/2018   Muscle cramps 01/02/2018   IDA (iron deficiency anemia) 12/22/2016   Primary osteoarthritis of both knees 10/03/2016   DDD (degenerative disc disease), cervical 10/03/2016   DDD (degenerative disc disease), lumbar 10/03/2016   History of total hip replacement, right 10/03/2016   History of anemia 10/03/2016   Chondromalacia 10/03/2016   Primary osteoarthritis of shoulder 10/03/2016   Primary osteoarthritis of both hands 10/03/2016   Abnormal nuclear stress test 08/23/2015   Globus sensation  08/13/2015   Encounter for screening mammogram for malignant neoplasm of breast 08/13/2015   Chest pain at rest    Chest pain with moderate risk of acute coronary syndrome 07/28/2015   Annual physical exam 07/11/2015   RLS (restless legs syndrome) 11/01/2014   Insomnia 11/01/2014   Thoracic back pain 01/12/2014   Allergic rhinitis 11/03/2013   Preventative health care 12/13/2012   Recurrent epistaxis 02/20/2012   Shoulder pain, bilateral 11/01/2011   Arthralgia 10/18/2011   Peripheral edema 09/10/2011   Asthma 08/19/2011   Palpitations 08/01/2011   Anemia 08/01/2011   Hypokalemia 08/01/2011   Elevated WBC count 08/01/2011   GERD (gastroesophageal reflux disease)    Low back pain radiating to right leg    Obesity (BMI 30-39.9) 07/17/2011   Hypothyroidism 07/17/2011   Hyperlipidemia 07/17/2011   Essential hypertension 07/17/2011    Past Medical History:  Diagnosis Date   Abnormal nuclear stress test    False positive. Cath 08/23/15 showed Angiographically normal coronary arteries   Anemia    Ankle fracture    Arthritis    Asthma    "asthmatic bronchitis"   Atrial fibrillation (HCC)    Back pain    Chicken pox as a child   Edema of both lower extremities    Essential hypertension 07/17/2011   ACEI d/c 06/2011 for refractory cough> resolved    GERD (gastroesophageal reflux disease)    Hyperlipidemia    Hypertension    Hypothyroidism    Insomnia 11/01/2014   Insulin resistance    pt reports she was told this in the past, does not take any medication for this   Joint pain  Low back pain radiating to right leg    intermittent and occasional numbness of skin over right hip in certain positions   Measles as a child   Mumps as a child   Obesity    Osteoarthritis    Palpitation    Pneumonia    this October 2013 from which she has said inhailer   PONV (postoperative nausea and vomiting)    "violent vomiting after surgery"   Recurrent epistaxis 02/20/2012   RLS  (restless legs syndrome) 11/01/2014   Skin cancer    Sleep apnea    cpap   SOB (shortness of breath) on exertion    Spinal stenosis    Vitamin B 12 deficiency    Vitamin D deficiency     Past Surgical History:  Procedure Laterality Date   CARDIAC CATHETERIZATION N/A 08/23/2015   Procedure: Left Heart Cath and Coronary Angiography;  Surgeon: Tonny Bollman, MD;  Location: [facility] INVASIVE CV LAB;  Service: Cardiovascular;  Laterality: N/A;   CATARACT EXTRACTION Bilateral    CESAREAN SECTION     X 3   GASTRECTOMY  12/2017   INGUINAL HERNIA REPAIR Right 72 yrs old   LUMBAR LAMINECTOMY/DECOMPRESSION MICRODISCECTOMY Bilateral 10/21/2021   Procedure: Laminectomy and Foraminotomy - bilateral - Lumbar Three-Lumbar Four - Lumbar Four- Lumbar Five;  Surgeon: Julio Sicks, MD;  Location: [facility] OR;  Service: Neurosurgery;  Laterality: Bilateral;   THYROIDECTOMY     total for benign tumor, Parathyroid spared   TONSILLECTOMY     TOTAL HIP ARTHROPLASTY Right 2006   secondary to congenital  hip defect   TOTAL KNEE ARTHROPLASTY Left 01/22/2023   Procedure: LEFT TOTAL KNEE ARTHROPLASTY;  Surgeon: Ollen Gross, MD;  Location: [facility] ORS;  Service: Orthopedics;  Laterality: Left;    Prior to Admission medications   Medication Sig Start Date End Date Taking? Authorizing Provider  acetaminophen (TYLENOL) 650 MG CR tablet Take 1,300 mg by mouth 2 (two) times daily.    [provider]  albuterol (VENTOLIN HFA) 108 (90 Base) MCG/ACT inhaler Inhale 1-2 puffs into the lungs every 6 (six) hours as needed for wheezing or shortness of breath. 08/21/22   Sandford Craze, NP  apixaban (ELIQUIS) 5 MG TABS tablet Take 5 mg by mouth 2 (two) times daily.    [provider]  apixaban (ELIQUIS) 5 MG TABS tablet Take 1 tablet (5 mg total) by mouth 2 (two) times daily. 05/09/23   O'Neal, Ronnald Ramp, MD  benzonatate (TESSALON) 200 MG capsule Take 1 capsule (200 mg total) by mouth 3 (three) times daily as  needed for cough. 04/17/22   Margaretann Loveless, PA-C  cetirizine (ZYRTEC) 10 MG tablet Take 1 tablet (10 mg total) by mouth daily. 03/19/23   Bradd Canary, MD  diltiazem (CARDIZEM CD) 240 MG 24 hr capsule Take 1 capsule (240 mg total) by mouth daily. 05/09/23   O'NealRonnald Ramp, MD  ELIQUIS 5 MG TABS tablet TAKE 1 TABLET(5 MG) BY MOUTH TWICE DAILY 05/21/23   Jonita Albee, PA-C  famotidine (PEPCID) 20 MG tablet TAKE 1 TABLET(20 MG) BY MOUTH AT BEDTIME 04/18/23   Bradd Canary, MD  fluticasone (FLONASE) 50 MCG/ACT nasal spray SHAKE LIQUID AND USE 2 SPRAYS IN EACH NOSTRIL AT BEDTIME 10/27/22   Bradd Canary, MD  furosemide (LASIX) 20 MG tablet TAKE 1 TABLET BY MOUTH DAILY AS NEEDED FOR FLUID OR EDEMA( WEIGHT GAIN MORE THAN 3 POUNDS IN 24 HOURS) 01/10/23   Bradd Canary, MD  levothyroxine (SYNTHROID) 100 MCG tablet TAKE 1 TABLET(100 MCG) BY MOUTH DAILY BEFORE BREAKFAST 01/29/23   Bradd Canary, MD  methocarbamol (ROBAXIN) 500 MG tablet Take 1 tablet (500 mg total) by mouth every 6 (six) hours as needed for muscle spasms. 01/23/23   Ammaar Encina L, PA  metoprolol tartrate (LOPRESSOR) 50 MG tablet TAKE 1 TABLET(50 MG) BY MOUTH TWICE DAILY 07/10/22   O'Neal, Ronnald Ramp, MD  ondansetron (ZOFRAN) 4 MG tablet Take 1 tablet (4 mg total) by mouth every 6 (six) hours as needed for nausea. 01/23/23   Marissah Vandemark L, PA  pantoprazole (PROTONIX) 40 MG tablet TAKE 1 TABLET(40 MG) BY MOUTH DAILY 08/01/22   Bradd Canary, MD  polyethylene glycol (MIRALAX / GLYCOLAX) 17 g packet Take 17 g by mouth daily as needed for moderate constipation.    [provider]  rOPINIRole (REQUIP) 2 MG tablet Take 1 tablet (2 mg total) by mouth at bedtime. 10/31/22   Bradd Canary, MD  traMADol (ULTRAM) 50 MG tablet Take 1-2 tablets (50-100 mg total) by mouth every 6 (six) hours as needed for moderate pain (pain score 4-6). 05/10/23   Eulis Foster, FNP  Vitamin D3 (VITAMIN D) 25 MCG tablet Take 1  tablet (1,000 Units total) by mouth daily. 01/27/22   Bradd Canary, MD    Allergies  Allergen Reactions   Bee Venom Anaphylaxis   Accupril [Quinapril Hcl] Cough   Quinapril-Hydrochlorothiazide Cough   Simvastatin Other (See Comments)    myalgias   Rosuvastatin Other (See Comments)    Leg aches.    Social History   Socioeconomic History   Marital status: Married    Spouse name: Not on file   Number of children: 3   Years of education: Not on file   Highest education level: Not on file  Occupational History   Occupation: Tree surgeon   Occupation: Careers information officer   Occupation: Retired Education administrator  Tobacco Use   Smoking status: Former    Current packs/day: 0.00    Average packs/day: 0.5 packs/day for 5.0 years (2.5 ttl pk-yrs)    Types: Cigarettes    Start date: 07/24/1966    Quit date: 07/25/1971    Years since quitting: 51.8    Passive exposure: Past   Smokeless tobacco: Never  Vaping Use   Vaping status: Never Used  Substance and Sexual Activity   Alcohol use: Not Currently    Comment: occ   Drug use: No   Sexual activity: Never    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  Other Topics Concern   Not on file  Social History Narrative   Not on file   Social Determinants of Health   Financial Resource Strain: Low Risk  (09/13/2022)   Overall Financial Resource Strain (CARDIA)    Difficulty of Paying Living Expenses: Not very hard  Food Insecurity: No Food Insecurity (01/22/2023)   Hunger Vital Sign    Worried About Running Out of Food in the Last Year: Never true    Ran Out of Food in the Last Year: Never true  Transportation Needs: No Transportation Needs (01/22/2023)   PRAPARE - Administrator, Civil Service (Medical): No    Lack of Transportation (Non-Medical): No  Physical Activity: Inactive (09/13/2022)   Exercise Vital Sign    Days of Exercise per Week: 0 days    Minutes of Exercise per Session: 0 min  Stress: Stress Concern Present  (12/27/2022)   Received from  [facility], [facility]   [employer] [facility] - Occupational Stress Questionnaire    Feeling of Stress : Rather much  Social Connections: Unknown (12/21/2022)   Received from [facility], [facility]   Social Network    Social Network: Not on file  Intimate Partner Violence: Not At Risk (01/22/2023)   Humiliation, Afraid, Rape, and Kick questionnaire    Fear of Current or Ex-Partner: No    Emotionally Abused: No    Physically Abused: No    Sexually Abused: No    Tobacco Use: Medium Risk (04/03/2023)   Patient History    Smoking Tobacco Use: Former    Smokeless Tobacco Use: Never    Passive Exposure: Past   Social History   Substance and Sexual Activity  Alcohol Use Not Currently   Comment: occ    Family History  Problem Relation Age of Onset   Cancer Mother    Heart failure Mother    Lung cancer Mother 79       smoker   Heart Problems Mother        tachycardia   Hearing loss Mother    Anxiety disorder Mother        anxiety, claustrophobia   Obesity Mother    Osteoarthritis Father    Hyperlipidemia Father    Anuerysm Father        AAA   Sudden death Father    Hyperlipidemia Sister    Migraines Sister    Hypertension Sister    Heart Problems Sister        tachycardia   Colon cancer Maternal Grandmother    Heart attack Maternal Grandfather    Aneurysm Paternal Grandfather        abdominal   Heart disease Paternal Grandfather        AAA rupture, smoker    Review of Systems  Constitutional:  Negative for chills and fever.  HENT:  Negative for congestion, sore throat and tinnitus.   Eyes:  Negative for double vision, photophobia and pain.  Respiratory:  Negative for cough, shortness of breath and wheezing.   Cardiovascular:  Negative for chest pain, palpitations and orthopnea.  Gastrointestinal:  Negative for heartburn, nausea and vomiting.  Genitourinary:  Negative for dysuria, frequency and urgency.   Musculoskeletal:  Positive for joint pain.  Neurological:  Negative for dizziness, weakness and headaches.    Objective:  Physical Exam: Well nourished and well developed.  General: Alert and oriented x3, cooperative and pleasant, no acute distress.  Head: normocephalic, atraumatic, neck supple.  Eyes: EOMI.  Musculoskeletal:  Right knee No effusion. Range of motion is 0-125 with moderate crepitus on range of motion. Tender medially. No lateral tenderness or instability.  Calves soft and nontender. Motor function intact in LE. Strength 5/5 LE bilaterally. Neuro: Distal pulses 2+. Sensation to light touch intact in LE.   Imaging Review Plain radiographs demonstrate severe degenerative joint disease of the right knee. The overall alignment is neutral. The bone quality appears to be adequate for age and reported activity level.  Assessment/Plan:  End stage arthritis, right knee   The patient history, physical examination, clinical judgment of the provider and imaging studies are consistent with end stage degenerative joint disease of the right knee and total knee arthroplasty is deemed medically necessary. The treatment options including medical management, injection therapy arthroscopy and arthroplasty were discussed at length. The risks and benefits of total knee arthroplasty were presented and reviewed. The risks due to aseptic loosening,  infection, stiffness, patella tracking problems, thromboembolic complications and other imponderables were discussed. The patient acknowledged the explanation, agreed to proceed with the plan and consent was signed. Patient is being admitted for inpatient treatment for surgery, pain control, PT, OT, prophylactic antibiotics, VTE prophylaxis, progressive ambulation and ADLs and discharge planning. The patient is planning to be discharged  home .  Patient's anticipated LOS is less than 2 midnights, meeting these requirements: - Lives within 1 hour of  care - Has a competent adult at home to recover with post-op recover - NO history of  - Chronic pain requiring opioids  - Diabetes  - Coronary Artery Disease  - Heart failure  - Heart attack  - Stroke  - DVT/VTE  - Respiratory Failure/COPD  - Renal failure  - Anemia  - Advanced Liver disease    Therapy Plans: Outpatient therapy at [facility] Disposition: Home with husband Planned DVT Prophylaxis: Eliquis 5 mg BID DME Needed: None PCP: Danise Edge, MD (clearance received) Cardiologist: Lennie Odor (clearance from 02/2023) TXA: IV Allergies: Accupril (cough), bee venom, rosuvastatin Metal Allergy: None Anesthesia Concerns: N/V BMI: 35.7 Last HgbA1c: Not diabetic Pain Regimen: Oxycodone, tramadol Pharmacy: [facility])  Other: - Holding eliquis 48 hrs prior per cards  - Patient was instructed on what medications to stop prior to surgery. - Follow-up visit in 2 weeks with Dr. Lequita Halt - Begin physical therapy following surgery - Pre-operative lab work as pre-surgical testing - Prescriptions will be provided in hospital at time of discharge  Arther Abbott, PA-C Orthopedic Surgery [facility] [place]

## 2023-06-06 ENCOUNTER — Encounter (HOSPITAL_COMMUNITY): Payer: Self-pay | Admitting: Physician Assistant

## 2023-06-06 ENCOUNTER — Ambulatory Visit (HOSPITAL_COMMUNITY)
Admission: RE | Admit: 2023-06-06 | Discharge: 2023-06-06 | Disposition: A | Discharge status: Home / Self Care | Payer: Medicare Other | Source: Ambulatory Visit | Attending: Physician Assistant | Admitting: Physician Assistant

## 2023-06-06 VITALS — BP 134/70 | HR 58 | Ht 61.0 in | Wt 191.2 lb

## 2023-06-06 DIAGNOSIS — I480 Paroxysmal atrial fibrillation: Secondary | ICD-10-CM | POA: Insufficient documentation

## 2023-06-06 DIAGNOSIS — Z6836 Body mass index (BMI) 36.0-36.9, adult: Secondary | ICD-10-CM | POA: Diagnosis not present

## 2023-06-06 DIAGNOSIS — G4733 Obstructive sleep apnea (adult) (pediatric): Secondary | ICD-10-CM | POA: Diagnosis not present

## 2023-06-06 DIAGNOSIS — I10 Essential (primary) hypertension: Secondary | ICD-10-CM | POA: Diagnosis not present

## 2023-06-06 DIAGNOSIS — D6869 Other thrombophilia: Secondary | ICD-10-CM | POA: Diagnosis not present

## 2023-06-06 DIAGNOSIS — R9431 Abnormal electrocardiogram [ECG] [EKG]: Secondary | ICD-10-CM | POA: Diagnosis not present

## 2023-06-06 DIAGNOSIS — Z7901 Long term (current) use of anticoagulants: Secondary | ICD-10-CM | POA: Insufficient documentation

## 2023-06-06 DIAGNOSIS — E039 Hypothyroidism, unspecified: Secondary | ICD-10-CM | POA: Diagnosis not present

## 2023-06-06 DIAGNOSIS — E785 Hyperlipidemia, unspecified: Secondary | ICD-10-CM | POA: Diagnosis not present

## 2023-06-06 DIAGNOSIS — E669 Obesity, unspecified: Secondary | ICD-10-CM | POA: Insufficient documentation

## 2023-06-06 NOTE — Patient Instructions (Signed)
SURGICAL WAITING ROOM VISITATION  Patients having surgery or a procedure may have no more than 2 support people in the waiting area - these visitors may rotate.    Children under the age of 54 must have an adult with them who is not the patient.  Due to an increase in RSV and influenza rates and associated hospitalizations, children ages 63 and under may not visit patients in [facility] hospitals.  If the patient needs to stay at the hospital during part of their recovery, the visitor guidelines for inpatient rooms apply. Pre-op nurse will coordinate an appropriate time for 1 support person to accompany patient in pre-op.  This support person may not rotate.    Please refer to the [facility] website for the visitor guidelines for Inpatients (after your surgery is over and you are in a regular room).    Your procedure is scheduled on: 06/18/23   Report to [facility] Main Entrance    Report to admitting at 8:00 AM   Call this number if you have problems the morning of surgery [phone]   Do not eat food :After Midnight.   After Midnight you may have the following liquids until 7:30 AM DAY OF SURGERY  Water Non-Citrus Juices (without pulp, NO RED-Apple, White grape, White cranberry) Black Coffee (NO MILK/CREAM OR CREAMERS, sugar ok)  Clear Tea (NO MILK/CREAM OR CREAMERS, sugar ok) regular and decaf                             Plain Jell-O (NO RED)                                           Fruit ices (not with fruit pulp, NO RED)                                     Popsicles (NO RED)                                                               Sports drinks like Gatorade (NO RED)  The day of surgery:  Drink ONE (1) Pre-Surgery Clear Ensure at 7:30 AM the morning of surgery. Drink in one sitting. Do not sip.  This drink was given to you during your hospital  pre-op appointment visit. Nothing else to drink after completing the  Pre-Surgery Clear Ensure.          If  you have questions, please contact your surgeon's office.   FOLLOW BOWEL PREP AND ANY ADDITIONAL PRE OP INSTRUCTIONS YOU RECEIVED FROM YOUR SURGEON'S OFFICE!!!     Oral Hygiene is also important to reduce your risk of infection.                                    Remember - BRUSH YOUR TEETH THE MORNING OF SURGERY WITH YOUR REGULAR TOOTHPASTE  DENTURES WILL BE REMOVED PRIOR TO SURGERY PLEASE DO NOT APPLY "Poly grip" OR ADHESIVES!!!  Stop all vitamins and herbal supplements 7 days before surgery.   Take these medicines the morning of surgery with A SIP OF WATER: Tylenol, Inhalers, Zyrtec, Diltiazem, Levothyroxine, Pantoprazole, Metoprolol,  Tramadol   These are anesthesia recommendations for holding your anticoagulants.  Please contact your prescribing physician to confirm IF it is safe to hold your anticoagulants for this length of time.   Eliquis Apixaban   72 hours   Xarelto Rivaroxaban   72 hours  Plavix Clopidogrel   120 hours  Pletal Cilostazol   120 hours   Bring CPAP mask and tubing day of surgery.                              You may not have any metal on your body including hair pins, jewelry, and body piercing             Do not wear make-up, lotions, powders, perfumes, or deodorant  Do not wear nail polish including gel and S&S, artificial/acrylic nails, or any other type of covering on natural nails including finger and toenails. If you have artificial nails, gel coating, etc. that needs to be removed by a nail salon please have this removed prior to surgery or surgery may need to be canceled/ delayed if the surgeon/ anesthesia feels like they are unable to be safely monitored.   Do not shave  48 hours prior to surgery.    Do not bring valuables to the hospital. [facility] IS NOT             RESPONSIBLE   FOR VALUABLES.   Contacts, glasses, dentures or bridgework may not be worn into surgery.   Bring small overnight bag day of surgery.   DO NOT BRING YOUR HOME  MEDICATIONS TO THE HOSPITAL. PHARMACY WILL DISPENSE MEDICATIONS LISTED ON YOUR MEDICATION LIST TO YOU DURING YOUR ADMISSION IN THE HOSPITAL!   Special Instructions: Bring a copy of your healthcare power of attorney and living will documents the day of surgery if you haven't scanned them before.              Please read over the following fact sheets you were given: IF YOU HAVE QUESTIONS ABOUT YOUR PRE-OP INSTRUCTIONS PLEASE CALL [name]   If you received a COVID test during your pre-op visit  it is requested that you wear a mask when out in public, stay away from anyone that may not be feeling well and notify your surgeon if you develop symptoms. If you test positive for Covid or have been in contact with anyone that has tested positive in the last 10 days please notify you surgeon.      Pre-operative 5 CHG Bath Instructions   You can play a key role in reducing the risk of infection after surgery. Your skin needs to be as free of germs as possible. You can reduce the number of germs on your skin by washing with CHG (chlorhexidine gluconate) soap before surgery. CHG is an antiseptic soap that kills germs and continues to kill germs even after washing.   DO NOT use if you have an allergy to chlorhexidine/CHG or antibacterial soaps. If your skin becomes reddened or irritated, stop using the CHG and notify one of our RNs at [phone].   Please shower with the CHG soap starting 4 days before surgery using the following schedule:     Please keep in mind the following:  DO NOT  shave, including legs and underarms, starting the day of your first shower.   You may shave your face at any point before/day of surgery.  Place clean sheets on your bed the day you start using CHG soap. Use a clean washcloth (not used since being washed) for each shower. DO NOT sleep with pets once you start using the CHG.   CHG Shower Instructions:  If you choose to wash your hair and private area,  wash first with your normal shampoo/soap.  After you use shampoo/soap, rinse your hair and body thoroughly to remove shampoo/soap residue.  Turn the water OFF and apply about 3 tablespoons (45 ml) of CHG soap to a CLEAN washcloth.  Apply CHG soap ONLY FROM YOUR NECK DOWN TO YOUR TOES (washing for 3-5 minutes)  DO NOT use CHG soap on face, private areas, open wounds, or sores.  Pay special attention to the area where your surgery is being performed.  If you are having back surgery, having someone wash your back for you may be helpful. Wait 2 minutes after CHG soap is applied, then you may rinse off the CHG soap.  Pat dry with a clean towel  Put on clean clothes/pajamas   If you choose to wear lotion, please use ONLY the CHG-compatible lotions on the back of this paper.     Additional instructions for the day of surgery: DO NOT APPLY any lotions, deodorants, cologne, or perfumes.   Put on clean/comfortable clothes.  Brush your teeth.  Ask your nurse before applying any prescription medications to the skin.      CHG Compatible Lotions   Aveeno Moisturizing lotion  Cetaphil Moisturizing Cream  Cetaphil Moisturizing Lotion  Clairol Herbal Essence Moisturizing Lotion, Dry Skin  Clairol Herbal Essence Moisturizing Lotion, Extra Dry Skin  Clairol Herbal Essence Moisturizing Lotion, Normal Skin  Curel Age Defying Therapeutic Moisturizing Lotion with Alpha Hydroxy  Curel Extreme Care Body Lotion  Curel Soothing Hands Moisturizing Hand Lotion  Curel Therapeutic Moisturizing Cream, Fragrance-Free  Curel Therapeutic Moisturizing Lotion, Fragrance-Free  Curel Therapeutic Moisturizing Lotion, Original Formula  Eucerin Daily Replenishing Lotion  Eucerin Dry Skin Therapy Plus Alpha Hydroxy Crme  Eucerin Dry Skin Therapy Plus Alpha Hydroxy Lotion  Eucerin Original Crme  Eucerin Original Lotion  Eucerin Plus Crme Eucerin Plus Lotion  Eucerin TriLipid Replenishing Lotion  Keri  Anti-Bacterial Hand Lotion  Keri Deep Conditioning Original Lotion Dry Skin Formula Softly Scented  Keri Deep Conditioning Original Lotion, Fragrance Free Sensitive Skin Formula  Keri Lotion Fast Absorbing Fragrance Free Sensitive Skin Formula  Keri Lotion Fast Absorbing Softly Scented Dry Skin Formula  Keri Original Lotion  Keri Skin Renewal Lotion Keri Silky Smooth Lotion  Keri Silky Smooth Sensitive Skin Lotion  Nivea Body Creamy Conditioning Oil  Nivea Body Extra Enriched Lotion  Nivea Body Original Lotion  Nivea Body Sheer Moisturizing Lotion Nivea Crme  Nivea Skin Firming Lotion  NutraDerm 30 Skin Lotion  NutraDerm Skin Lotion  NutraDerm Therapeutic Skin Cream  NutraDerm Therapeutic Skin Lotion  ProShield Protective Hand Cream  Provon moisturizing lotion   Incentive Spirometer  An incentive spirometer is a tool that can help keep your lungs clear and active. This tool measures how well you are filling your lungs with each breath. Taking long deep breaths may help reverse or decrease the chance of developing breathing (pulmonary) problems (especially infection) following: A long period of time when you are unable to move or be active. BEFORE THE PROCEDURE  If the spirometer includes  an indicator to show your best effort, your nurse or respiratory therapist will set it to a desired goal. If possible, sit up straight or lean slightly forward. Try not to slouch. Hold the incentive spirometer in an upright position. INSTRUCTIONS FOR USE  Sit on the edge of your bed if possible, or sit up as far as you can in bed or on a chair. Hold the incentive spirometer in an upright position. Breathe out normally. Place the mouthpiece in your mouth and seal your lips tightly around it. Breathe in slowly and as deeply as possible, raising the piston or the ball toward the top of the column. Hold your breath for 3-5 seconds or for as long as possible. Allow the piston or ball to fall to the  bottom of the column. Remove the mouthpiece from your mouth and breathe out normally. Rest for a few seconds and repeat Steps 1 through 7 at least 10 times every 1-2 hours when you are awake. Take your time and take a few normal breaths between deep breaths. The spirometer may include an indicator to show your best effort. Use the indicator as a goal to work toward during each repetition. After each set of 10 deep breaths, practice coughing to be sure your lungs are clear. If you have an incision (the cut made at the time of surgery), support your incision when coughing by placing a pillow or rolled up towels firmly against it. Once you are able to get out of bed, walk around indoors and cough well. You may stop using the incentive spirometer when instructed by your caregiver.  RISKS AND COMPLICATIONS Take your time so you do not get dizzy or light-headed. If you are in pain, you may need to take or ask for pain medication before doing incentive spirometry. It is harder to take a deep breath if you are having pain. AFTER USE Rest and breathe slowly and easily. It can be helpful to keep track of a log of your progress. Your caregiver can provide you with a simple table to help with this. If you are using the spirometer at home, follow these instructions: SEEK MEDICAL CARE IF:  You are having difficultly using the spirometer. You have trouble using the spirometer as often as instructed. Your pain medication is not giving enough relief while using the spirometer. You develop fever of 100.5 F (38.1 C) or higher. SEEK IMMEDIATE MEDICAL CARE IF:  You cough up bloody sputum that had not been present before. You develop fever of 102 F (38.9 C) or greater. You develop worsening pain at or near the incision site. MAKE SURE YOU:  Understand these instructions. Will watch your condition. Will get help right away if you are not doing well or get worse. Document Released: 11/20/2006 Document Revised:  10/02/2011 Document Reviewed: 01/21/2007 [facility] Patient Information 2014 [place], [place].   ________________________________________________________________________

## 2023-06-06 NOTE — Progress Notes (Addendum)
COVID Vaccine Completed: yes  Date of COVID positive in last 90 days:  PCP - Danise Edge, MD Cardiologist - Lennie Odor, MD LOV 06/06/23 Epic  Medical clearance by Danise Edge, MD 05/01/23 in Epic  Chest x-ray - n/a EKG - 06/06/23 Epic Stress Test - 07/29/15 Epic ECHO - 04/10/22 Epic Cardiac Cath - 08/23/15 Epic Pacemaker/ICD device last checked: n/a Spinal Cord Stimulator: n/a  Bowel Prep - no  Sleep Study - yes CPAP - yes every night   Fasting Blood Sugar - n/a Checks Blood Sugar _____ times a day  Last dose of GLP1 agonist-  N/A GLP1 instructions:  Hold 7 days before surgery    Last dose of SGLT-2 inhibitors-  N/A SGLT-2 instructions:  Hold 3 days before surgery    Blood Thinner Instructions:  Eliquis, hold 3 days Aspirin Instructions: Last Dose: 06/14/23 2300  Activity level: Can go up a flight of stairs and perform activities of daily living without stopping and without symptoms of chest pain or shortness of breath. Slow with stairs due    Anesthesia review: HTN, a fib, OSA, asthma, murmur, anemia  Patient denies shortness of breath, fever, cough and chest pain at PAT appointment  Patient verbalized understanding of instructions that were given to them at the PAT appointment. Patient was also instructed that they will need to review over the PAT instructions again at home before surgery.

## 2023-06-06 NOTE — Progress Notes (Signed)
Primary Care Physician: Bradd Canary, MD Primary Cardiologist: Reatha Harps, MD Electrophysiologist: None  Referring Physician: Dr Haskell Riling Rebecca Brooks is a 72 y.o. female with a history of HLD, HTN, OSA, hypothyroidism, atrial fibrillation who presents for follow up in the [facility]. Patient was seen in the ED on 05/03/2022 with new onset of atrial fibrillation with RVR. She was started on Cardizem drip and there was rate controlled. She was eventually discharged on Eliquis 5 mg twice a day and long-acting Cardizem 240 mg daily with plan to pursue outpatient cardioversion in 3 weeks with uninterrupted anticoagulation therapy. Patient was returned back to the hospital on 05/05/2022 with a recurrent A-fib, cardiology service was called again to admit the patient, however she was already back in sinus rhythm, therefore was discharged from the emergency room. She remained in SR until 10/16 when she had tachypalpitations in the middle of the night. Apple watch strips reviewed. She reports that she did miss a dose of diltiazem the day previous. She also did not use her CPAP for several days. She converted to SR with vagal maneuvers.   On follow up today, patient has maintained SR since then. She routinely checks her Apple Watch. No bleeding issues on anticoagulation.   Today, she denies symptoms of palpitations, chest pain, shortness of breath, orthopnea, PND, lower extremity edema, dizziness, presyncope, syncope, snoring, daytime somnolence, bleeding, or neurologic sequela. The patient is tolerating medications without difficulties and is otherwise without complaint today.    Atrial Fibrillation Risk Factors:  she does have symptoms or diagnosis of sleep apnea. she does not have a history of rheumatic fever. she does not have a history of alcohol use. The patient does not have a history of early familial atrial fibrillation or other  arrhythmias.  Atrial Fibrillation Management history:  Previous antiarrhythmic drugs: none Previous cardioversions: none Previous ablations: none Anticoagulation history: Eliquis  ROS- All systems are reviewed and negative except as per the HPI above.  Past Medical History:  Diagnosis Date   Abnormal nuclear stress test    False positive. Cath 08/23/15 showed Angiographically normal coronary arteries   Anemia    Ankle fracture    Arthritis    Asthma    "asthmatic bronchitis"   Atrial fibrillation (HCC)    Back pain    Chicken pox as a child   Edema of both lower extremities    Essential hypertension 07/17/2011   ACEI d/c 06/2011 for refractory cough> resolved    GERD (gastroesophageal reflux disease)    Hyperlipidemia    Hypertension    Hypothyroidism    Insomnia 11/01/2014   Insulin resistance    pt reports she was told this in the past, does not take any medication for this   Joint pain    Low back pain radiating to right leg    intermittent and occasional numbness of skin over right hip in certain positions   Measles as a child   Mumps as a child   Obesity    Osteoarthritis    Palpitation    Pneumonia    this October 2013 from which she has said inhailer   PONV (postoperative nausea and vomiting)    "violent vomiting after surgery"   Recurrent epistaxis 02/20/2012   RLS (restless legs syndrome) 11/01/2014   Skin cancer    Sleep apnea    cpap   SOB (shortness of breath) on exertion    Spinal stenosis  Vitamin B 12 deficiency    Vitamin D deficiency     Current Outpatient Medications  Medication Sig Dispense Refill   acetaminophen (TYLENOL) 650 MG CR tablet Take 1,300 mg by mouth 2 (two) times daily.     albuterol (VENTOLIN HFA) 108 (90 Base) MCG/ACT inhaler Inhale 1-2 puffs into the lungs every 6 (six) hours as needed for wheezing or shortness of breath. 1 each 0   apixaban (ELIQUIS) 5 MG TABS tablet Take 1 tablet (5 mg total) by mouth 2 (two) times  daily. 28 tablet    benzonatate (TESSALON) 200 MG capsule Take 1 capsule (200 mg total) by mouth 3 (three) times daily as needed for cough. 30 capsule 0   cetirizine (ZYRTEC) 10 MG tablet Take 1 tablet (10 mg total) by mouth daily. 90 tablet 1   diltiazem (CARDIZEM CD) 240 MG 24 hr capsule Take 1 capsule (240 mg total) by mouth daily. 90 capsule 3   ELIQUIS 5 MG TABS tablet TAKE 1 TABLET(5 MG) BY MOUTH TWICE DAILY 60 tablet 11   famotidine (PEPCID) 20 MG tablet TAKE 1 TABLET(20 MG) BY MOUTH AT BEDTIME 90 tablet 1   fluticasone (FLONASE) 50 MCG/ACT nasal spray SHAKE LIQUID AND USE 2 SPRAYS IN EACH NOSTRIL AT BEDTIME 16 g 5   furosemide (LASIX) 20 MG tablet TAKE 1 TABLET BY MOUTH DAILY AS NEEDED FOR FLUID OR EDEMA( WEIGHT GAIN MORE THAN 3 POUNDS IN 24 HOURS) (Patient taking differently: Take 20 mg by mouth daily.) 180 tablet 1   levothyroxine (SYNTHROID) 100 MCG tablet TAKE 1 TABLET(100 MCG) BY MOUTH DAILY BEFORE BREAKFAST 90 tablet 0   methocarbamol (ROBAXIN) 500 MG tablet Take 1 tablet (500 mg total) by mouth every 6 (six) hours as needed for muscle spasms. 40 tablet 0   metoprolol tartrate (LOPRESSOR) 50 MG tablet TAKE 1 TABLET(50 MG) BY MOUTH TWICE DAILY 180 tablet 3   ondansetron (ZOFRAN) 4 MG tablet Take 1 tablet (4 mg total) by mouth every 6 (six) hours as needed for nausea. 20 tablet 0   pantoprazole (PROTONIX) 40 MG tablet TAKE 1 TABLET(40 MG) BY MOUTH DAILY 90 tablet 3   polyethylene glycol (MIRALAX / GLYCOLAX) 17 g packet Take 17 g by mouth daily as needed for moderate constipation.     rOPINIRole (REQUIP) 2 MG tablet Take 1 tablet (2 mg total) by mouth at bedtime. 30 tablet 5   traMADol (ULTRAM) 50 MG tablet Take 1-2 tablets (50-100 mg total) by mouth every 6 (six) hours as needed for moderate pain (pain score 4-6). 90 tablet 0   Vitamin D3 (VITAMIN D) 25 MCG tablet Take 1 tablet (1,000 Units total) by mouth daily. 60 tablet 1   No current facility-administered medications for this  encounter.    Physical Exam: BP 134/70   Pulse (!) 58   Ht 5\' 1"  (1.549 m)   Wt 86.7 kg   BMI 36.13 kg/m   GEN: Well nourished, well developed in no acute distress NECK: No JVD; No carotid bruits CARDIAC: Regular rate and rhythm, no murmurs, rubs, gallops RESPIRATORY:  Clear to auscultation without rales, wheezing or rhonchi  ABDOMEN: Soft, non-tender, non-distended EXTREMITIES:  No edema; No deformity   Wt Readings from Last 3 Encounters:  06/06/23 86.7 kg  05/01/23 87.4 kg  04/03/23 87.5 kg     EKG today demonstrates  SB, slow R wave prog Vent. rate 58 BPM PR interval 190 ms QRS duration 98 ms QT/QTcB 416/408 ms   Echo  04/10/22 demonstrated   1. Left ventricular ejection fraction, by estimation, is 60 to 65%. The  left ventricle has normal function. The left ventricle has no regional  wall motion abnormalities. Left ventricular diastolic parameters were  normal.   2. Right ventricular systolic function is normal. The right ventricular  size is normal. There is normal pulmonary artery systolic pressure.   3. Left atrial size was mildly dilated.   4. The mitral valve is normal in structure. No evidence of mitral valve  regurgitation. No evidence of mitral stenosis.   5. The aortic valve is tricuspid. There is mild calcification of the  aortic valve. Aortic valve regurgitation is not visualized. Aortic valve  sclerosis is present, with no evidence of aortic valve stenosis.   6. The inferior vena cava is normal in size with greater than 50%  respiratory variability, suggesting right atrial pressure of 3 mmHg.    CHA2DS2-VASc Score = 3  The patient's score is based upon: CHF History: 0 HTN History: 1 Diabetes History: 0 Stroke History: 0 Vascular Disease History: 0 Age Score: 1 Gender Score: 1       ASSESSMENT AND PLAN: Paroxysmal Atrial Fibrillation (ICD10:  I48.0) The patient's CHA2DS2-VASc score is 3, indicating a 3.2% annual risk of stroke.   General  education about afib provided and questions answered.  If she has more frequent afib, can consider AAD (flecainide, dofetilide, Multaq) or ablation.  Continue Eliquis 5 mg BID Continue diltiazem 240 mg daily Continue Lopressor 50 mg BID Apple watch for home monitoring  Secondary Hypercoagulable State (ICD10:  D68.69) The patient is at significant risk for stroke/thromboembolism based upon her CHA2DS2-VASc Score of 3.  Continue Apixaban (Eliquis).   HTN Stable on current regimen  OSA  Encouraged nightly CPAP The importance of adequate treatment of sleep apnea was discussed today in order to improve our ability to maintain sinus rhythm long term.  Obesity Body mass index is 36.13 kg/m.  Encouraged lifestyle modification    Follow up in the AF clinic in 3 months.        Jorja Loa PA-C Afib [facility] [address] [place], [place] [address] [phone]

## 2023-06-07 ENCOUNTER — Encounter (HOSPITAL_COMMUNITY): Payer: Self-pay

## 2023-06-07 ENCOUNTER — Encounter (HOSPITAL_COMMUNITY)
Admission: RE | Admit: 2023-06-07 | Discharge: 2023-06-07 | Disposition: A | Discharge status: Home / Self Care | Payer: Medicare Other | Source: Ambulatory Visit | Attending: Orthopedic Surgery | Admitting: Orthopedic Surgery

## 2023-06-07 ENCOUNTER — Other Ambulatory Visit: Payer: Self-pay

## 2023-06-07 VITALS — BP 124/63 | HR 62 | Temp 98.3°F | Resp 14 | Ht 61.75 in | Wt 189.0 lb

## 2023-06-07 DIAGNOSIS — I480 Paroxysmal atrial fibrillation: Secondary | ICD-10-CM | POA: Insufficient documentation

## 2023-06-07 DIAGNOSIS — Z01812 Encounter for preprocedural laboratory examination: Secondary | ICD-10-CM | POA: Insufficient documentation

## 2023-06-07 DIAGNOSIS — Z01818 Encounter for other preprocedural examination: Secondary | ICD-10-CM

## 2023-06-07 LAB — CBC
HCT: 38.3 % (ref 36.0–46.0)
Hemoglobin: 12.7 g/dL (ref 12.0–15.0)
MCH: 32 pg (ref 26.0–34.0)
MCHC: 33.2 g/dL (ref 30.0–36.0)
MCV: 96.5 fL (ref 80.0–100.0)
Platelets: 374 10*3/uL (ref 150–400)
RBC: 3.97 MIL/uL (ref 3.87–5.11)
RDW: 13.1 % (ref 11.5–15.5)
WBC: 7.1 10*3/uL (ref 4.0–10.5)
nRBC: 0 % (ref 0.0–0.2)

## 2023-06-07 LAB — BASIC METABOLIC PANEL
Anion gap: 7 (ref 5–15)
BUN: 38 mg/dL — ABNORMAL HIGH (ref 8–23)
CO2: 28 mmol/L (ref 22–32)
Calcium: 8.9 mg/dL (ref 8.9–10.3)
Chloride: 103 mmol/L (ref 98–111)
Creatinine, Ser: 0.49 mg/dL (ref 0.44–1.00)
GFR, Estimated: >60 mL/min (ref >60)
Glucose, Bld: 82 mg/dL (ref 70–99)
Potassium: 4.6 mmol/L (ref 3.5–5.1)
Sodium: 138 mmol/L (ref 135–145)

## 2023-06-07 LAB — SURGICAL PCR SCREEN
MRSA, PCR: NEGATIVE
Staphylococcus aureus: POSITIVE — AB

## 2023-06-07 NOTE — Progress Notes (Signed)
[facility] results routed to Dr. Lequita Halt

## 2023-06-11 NOTE — Anesthesia Preprocedure Evaluation (Addendum)
Anesthesia Evaluation  Patient identified by MRN, date of birth, ID band Patient awake    Reviewed: Allergy & Precautions, H&P , NPO status , Patient's Chart, lab work & pertinent test results  Airway Mallampati: II  TM Distance: >3 FB Neck ROM: Full    Dental no notable dental hx.    Pulmonary sleep apnea , former smoker   Pulmonary exam normal breath sounds clear to auscultation       Cardiovascular hypertension, Pt. on medications Normal cardiovascular exam+ dysrhythmias Atrial Fibrillation  Rhythm:Regular Rate:Normal     Neuro/Psych negative neurological ROS  negative psych ROS   GI/Hepatic Neg liver ROS,GERD  Medicated,,  Endo/Other  Hypothyroidism    Renal/GU negative Renal ROS  negative genitourinary   Musculoskeletal negative musculoskeletal ROS (+)    Abdominal   Peds negative pediatric ROS (+)  Hematology negative hematology ROS (+)   Anesthesia Other Findings   Reproductive/Obstetrics negative OB ROS                             Anesthesia Physical Anesthesia Plan  ASA: 3  Anesthesia Plan: Spinal   Post-op Pain Management: Regional block*   Induction: Intravenous  PONV Risk Score and Plan: 2 and Ondansetron, Treatment may vary due to age or medical condition and Propofol infusion  Airway Management Planned: Simple Face Mask  Additional Equipment:   Intra-op Plan:   Post-operative Plan:   Informed Consent: I have reviewed the patients History and Physical, chart, labs and discussed the procedure including the risks, benefits and alternatives for the proposed anesthesia with the patient or authorized representative who has indicated his/her understanding and acceptance.     Dental advisory given  Plan Discussed with: CRNA and Surgeon  Anesthesia Plan Comments: (See PAT note from 11/14 by K Gekas PA-C )        Anesthesia Quick Evaluation

## 2023-06-11 NOTE — Progress Notes (Signed)
Case: [id number] Date/Time: 06/18/23 1015   Procedure: TOTAL KNEE ARTHROPLASTY (Right: Knee)   Anesthesia type: Choice   Pre-op diagnosis: right knee osteoarthritis   Location: [facility] ROOM 10 / [facility]   Surgeons: Ollen Gross, MD       DISCUSSION: Rebecca Brooks is a 72 yo female who presents to PAT prior to surgery above. PMH for former smoking, obesity, GERD, HTN, hypothyroidism, A.fib on Eliquis, OSA.   Prior anesthesia complications include PONV.   Patient had left TKA on 01/22/23 without complications.   Patient follows with cardiology for paroxysmal A-fib.  She last seen in A.fib clinic on 06/06/23. She has been doing well from a cardiac standpoint and has good functional status.  Has maintained SR and "routinely checks her Apple watch". Stable on current medication regimen. She received cardiac clearance prior to left TKA during OV with Dr. Bufford Buttner (on  01/01/23) and has had no changes to her cardiac hx since then.   Blood Thinner Instructions:  Eliquis, hold 3 days. Last Dose: 06/14/23 2300  Patient follows with her PCP for all other chronic medical issues. Last saw her PCP on 05/01/23. Cleared for surgery: "Managed by rheumatologist with knee injections. Upcoming total knee replacement (TKR) surgery scheduled for 06/18/2023.  -Continue current management plan with rheumatologist. -Cleared for surgery unless unexpected findings in preoperative blood work."    VS: BP 124/63   Pulse 62   Temp 36.8 C (Oral)   Resp 14   Ht 5' 1.75" (1.568 m)   Wt 85.7 kg   SpO2 97%   BMI 34.85 kg/m   PROVIDERS: Bradd Canary, MD Cardiology: Gara Kroner   LABS: Labs reviewed: Acceptable for surgery. (all labs ordered are listed, but only abnormal results are displayed)  Labs Reviewed  SURGICAL PCR SCREEN - Abnormal; Notable for the following components:      Result Value   Staphylococcus aureus POSITIVE (*)    All other components within normal limits  BASIC METABOLIC PANEL -  Abnormal; Notable for the following components:   BUN 38 (*)    All other components within normal limits  CBC    IMAGES:   CT A/P 01/08/23:  IMPRESSION: 1. No acute intra-abdominal or pelvic pathology. 2. No suspicious renal lesion by CT. 3. Mild sigmoid diverticulosis. No bowel obstruction. Normal appendix. 4.  Aortic Atherosclerosis (ICD10-I70.0).     EKG 01/01/23:   Sinus bradycardia, rate 57     CV:   Echo 04/10/22:   IMPRESSIONS     1. Left ventricular ejection fraction, by estimation, is 60 to 65%. The  left ventricle has normal function. The left ventricle has no regional  wall motion abnormalities. Left ventricular diastolic parameters were  normal.   2. Right ventricular systolic function is normal. The right ventricular  size is normal. There is normal pulmonary artery systolic pressure.   3. Left atrial size was mildly dilated.   4. The mitral valve is normal in structure. No evidence of mitral valve  regurgitation. No evidence of mitral stenosis.   5. The aortic valve is tricuspid. There is mild calcification of the  aortic valve. Aortic valve regurgitation is not visualized. Aortic valve  sclerosis is present, with no evidence of aortic valve stenosis.   6. The inferior vena cava is normal in size with greater than 50%  respiratory variability, suggesting right atrial pressure of 3 mmHg.   Left heart cath 08/23/2015:   Diagnostic Dominance: Right Left Main The left main is  patent without stenosis. It divides into the LAD and left circumflex. Left Anterior Descending The LAD is patent without stenosis. It reaches the LV apex. There are 2 diagonal branches, both medium in caliber, without stenosis. Left Circumflex Vessel was injected. Vessel is normal in caliber. Vessel is angiographically normal. The circumflex is patent without stenosis. It supplies a single obtuse marginal branch without stenosis. This obtuse marginal branch divides into twin vessels  distally. Right Coronary Artery . Vessel is angiographically normal. The RCA is a dominant vessel. It is patent throughout. There is no significant stenosis. The PDA and PLA branches are patent without stenosis. Intervention   No interventions have been documented.   Past Medical History:  Diagnosis Date   Abnormal nuclear stress test    False positive. Cath 08/23/15 showed Angiographically normal coronary arteries   Anemia    Ankle fracture    Arthritis    Asthma    "asthmatic bronchitis"   Atrial fibrillation (HCC)    Back pain    Chicken pox as a child   Edema of both lower extremities    Essential hypertension 07/17/2011   ACEI d/c 06/2011 for refractory cough> resolved    GERD (gastroesophageal reflux disease)    Hyperlipidemia    Hypertension    Hypothyroidism    Insomnia 11/01/2014   Insulin resistance    pt reports she was told this in the past, does not take any medication for this   Joint pain    Low back pain radiating to right leg    intermittent and occasional numbness of skin over right hip in certain positions   Measles as a child   Mumps as a child   Obesity    Osteoarthritis    Palpitation    Pneumonia    this October 2013 from which she has said inhailer   PONV (postoperative nausea and vomiting)    "violent vomiting after surgery"   Recurrent epistaxis 02/20/2012   RLS (restless legs syndrome) 11/01/2014   Skin cancer    Sleep apnea    cpap   SOB (shortness of breath) on exertion    Spinal stenosis    Vitamin B 12 deficiency    Vitamin D deficiency     Past Surgical History:  Procedure Laterality Date   CARDIAC CATHETERIZATION N/A 08/23/2015   Procedure: Left Heart Cath and Coronary Angiography;  Surgeon: Tonny Bollman, MD;  Location[facility];  Service: Cardiovascular;  Laterality: N/A;   CATARACT EXTRACTION Bilateral    CESAREAN SECTION     X 3   GASTRECTOMY  12/2017   INGUINAL HERNIA REPAIR Right 72 yrs old   LUMBAR  LAMINECTOMY/DECOMPRESSION MICRODISCECTOMY Bilateral 10/21/2021   Procedure: Laminectomy and Foraminotomy - bilateral - Lumbar Three-Lumbar Four - Lumbar Four- Lumbar Five;  Surgeon: Julio Sicks, MD;  Location: [facility];  Service: Neurosurgery;  Laterality: Bilateral;   THYROIDECTOMY     total for benign tumor, Parathyroid spared   TONSILLECTOMY     TOTAL HIP ARTHROPLASTY Right 2006   secondary to congenital  hip defect   TOTAL KNEE ARTHROPLASTY Left 01/22/2023   Procedure: LEFT TOTAL KNEE ARTHROPLASTY;  Surgeon: Ollen Gross, MD;  Location: [facility];  Service: Orthopedics;  Laterality: Left;    MEDICATIONS:  acetaminophen (TYLENOL) 650 MG CR tablet   albuterol (VENTOLIN HFA) 108 (90 Base) MCG/ACT inhaler   apixaban (ELIQUIS) 5 MG TABS tablet   benzonatate (TESSALON) 200 MG capsule   cetirizine (ZYRTEC) 10 MG tablet  diltiazem (CARDIZEM CD) 240 MG 24 hr capsule   famotidine (PEPCID) 20 MG tablet   fluticasone (FLONASE) 50 MCG/ACT nasal spray   furosemide (LASIX) 20 MG tablet   levothyroxine (SYNTHROID) 100 MCG tablet   methocarbamol (ROBAXIN) 500 MG tablet   metoprolol tartrate (LOPRESSOR) 50 MG tablet   ondansetron (ZOFRAN) 4 MG tablet   pantoprazole (PROTONIX) 40 MG tablet   polyethylene glycol (MIRALAX / GLYCOLAX) 17 g packet   rOPINIRole (REQUIP) 2 MG tablet   traMADol (ULTRAM) 50 MG tablet   Vitamin D3 (VITAMIN D) 25 MCG tablet   No current facility-administered medications for this encounter.   Marcille Blanco [facility] Short Stay/Anesthesiology [facility] Phone [phone] 06/11/2023 10:26 AM

## 2023-06-14 DIAGNOSIS — M19011 Primary osteoarthritis, right shoulder: Secondary | ICD-10-CM | POA: Diagnosis not present

## 2023-06-18 ENCOUNTER — Ambulatory Visit (HOSPITAL_COMMUNITY): Payer: Medicare Other | Admitting: Physician Assistant

## 2023-06-18 ENCOUNTER — Other Ambulatory Visit: Payer: Self-pay

## 2023-06-18 ENCOUNTER — Encounter (HOSPITAL_COMMUNITY): Payer: Self-pay | Admitting: Orthopedic Surgery

## 2023-06-18 ENCOUNTER — Observation Stay (HOSPITAL_COMMUNITY)
Admission: RE | Admit: 2023-06-18 | Discharge: 2023-06-19 | Disposition: A | Discharge status: Home / Self Care | Payer: Medicare Other | Attending: Orthopedic Surgery | Admitting: Orthopedic Surgery

## 2023-06-18 ENCOUNTER — Encounter (HOSPITAL_COMMUNITY)
Admission: RE | Disposition: A | Discharge status: Home / Self Care | Payer: Self-pay | Source: Home / Self Care | Attending: Orthopedic Surgery

## 2023-06-18 ENCOUNTER — Ambulatory Visit (HOSPITAL_COMMUNITY): Payer: Medicare Other | Admitting: Anesthesiology

## 2023-06-18 DIAGNOSIS — Z85828 Personal history of other malignant neoplasm of skin: Secondary | ICD-10-CM | POA: Insufficient documentation

## 2023-06-18 DIAGNOSIS — J45909 Unspecified asthma, uncomplicated: Secondary | ICD-10-CM | POA: Insufficient documentation

## 2023-06-18 DIAGNOSIS — E039 Hypothyroidism, unspecified: Secondary | ICD-10-CM | POA: Insufficient documentation

## 2023-06-18 DIAGNOSIS — Z96652 Presence of left artificial knee joint: Secondary | ICD-10-CM | POA: Insufficient documentation

## 2023-06-18 DIAGNOSIS — Z96641 Presence of right artificial hip joint: Secondary | ICD-10-CM | POA: Insufficient documentation

## 2023-06-18 DIAGNOSIS — Z7901 Long term (current) use of anticoagulants: Secondary | ICD-10-CM | POA: Diagnosis not present

## 2023-06-18 DIAGNOSIS — Z8616 Personal history of COVID-19: Secondary | ICD-10-CM | POA: Diagnosis not present

## 2023-06-18 DIAGNOSIS — M1711 Unilateral primary osteoarthritis, right knee: Secondary | ICD-10-CM | POA: Diagnosis not present

## 2023-06-18 DIAGNOSIS — Z79899 Other long term (current) drug therapy: Secondary | ICD-10-CM | POA: Insufficient documentation

## 2023-06-18 DIAGNOSIS — Z87891 Personal history of nicotine dependence: Secondary | ICD-10-CM | POA: Diagnosis not present

## 2023-06-18 DIAGNOSIS — I4891 Unspecified atrial fibrillation: Secondary | ICD-10-CM | POA: Insufficient documentation

## 2023-06-18 DIAGNOSIS — G8918 Other acute postprocedural pain: Secondary | ICD-10-CM | POA: Diagnosis not present

## 2023-06-18 DIAGNOSIS — I10 Essential (primary) hypertension: Secondary | ICD-10-CM | POA: Diagnosis not present

## 2023-06-18 DIAGNOSIS — M179 Osteoarthritis of knee, unspecified: Principal | ICD-10-CM | POA: Diagnosis present

## 2023-06-18 HISTORY — PX: TOTAL KNEE ARTHROPLASTY: SHX125

## 2023-06-18 SURGERY — ARTHROPLASTY, KNEE, TOTAL
Anesthesia: Spinal | Site: Knee | Laterality: Right

## 2023-06-18 MED ORDER — ONDANSETRON HCL 4 MG/2ML IJ SOLN
4.0000 mg | Freq: Four times a day (QID) | INTRAMUSCULAR | Status: DC | PRN
  Administered 2023-06-18: 4 mg via INTRAVENOUS
  Filled 2023-06-18: qty 2

## 2023-06-18 MED ORDER — OXYCODONE HCL 5 MG/5ML PO SOLN: 5.0000 mg | Freq: Once | ORAL | Status: DC | PRN

## 2023-06-18 MED ORDER — DILTIAZEM HCL ER COATED BEADS 240 MG PO CP24
240.0000 mg | ORAL_CAPSULE | Freq: Every day | ORAL | Status: DC
  Administered 2023-06-19: 240 mg via ORAL
  Filled 2023-06-18: qty 1

## 2023-06-18 MED ORDER — MENTHOL 3 MG MT LOZG: 1.0000 | LOZENGE | OROMUCOSAL | Status: DC | PRN

## 2023-06-18 MED ORDER — CHLORHEXIDINE GLUCONATE 0.12 % MT SOLN
15.0000 mL | Freq: Once | OROMUCOSAL | Status: AC
  Administered 2023-06-18: 15 mL via OROMUCOSAL

## 2023-06-18 MED ORDER — SODIUM CHLORIDE 0.9 % IV SOLN: INTRAVENOUS | Status: DC

## 2023-06-18 MED ORDER — METHOCARBAMOL 500 MG PO TABS
500.0000 mg | ORAL_TABLET | Freq: Four times a day (QID) | ORAL | Status: DC | PRN
  Administered 2023-06-18 – 2023-06-19 (×4): 500 mg via ORAL
  Filled 2023-06-18 (×4): qty 1

## 2023-06-18 MED ORDER — FAMOTIDINE 20 MG PO TABS
20.0000 mg | ORAL_TABLET | Freq: Every day | ORAL | Status: DC
  Administered 2023-06-18: 20 mg via ORAL
  Filled 2023-06-18: qty 1

## 2023-06-18 MED ORDER — OXYCODONE HCL 5 MG PO TABS: 5.0000 mg | ORAL_TABLET | Freq: Once | ORAL | Status: DC | PRN

## 2023-06-18 MED ORDER — ONDANSETRON HCL 4 MG PO TABS
4.0000 mg | ORAL_TABLET | Freq: Four times a day (QID) | ORAL | Status: DC | PRN
Start: 2023-06-18 — End: 2023-06-19

## 2023-06-18 MED ORDER — ACETAMINOPHEN 325 MG PO TABS
325.0000 mg | ORAL_TABLET | Freq: Four times a day (QID) | ORAL | Status: DC | PRN
  Administered 2023-06-19 (×2): 650 mg via ORAL
  Filled 2023-06-18 (×2): qty 2

## 2023-06-18 MED ORDER — BUPIVACAINE LIPOSOME 1.3 % IJ SUSP
INTRAMUSCULAR | Status: DC | PRN
  Administered 2023-06-18: 20 mL

## 2023-06-18 MED ORDER — HYDROMORPHONE HCL 1 MG/ML IJ SOLN: 0.2500 mg | INTRAMUSCULAR | Status: DC | PRN

## 2023-06-18 MED ORDER — ROPIVACAINE HCL 7.5 MG/ML IJ SOLN
INTRAMUSCULAR | Status: DC | PRN
  Administered 2023-06-18: 20 mL via PERINEURAL

## 2023-06-18 MED ORDER — DIPHENHYDRAMINE HCL 12.5 MG/5ML PO ELIX: 12.5000 mg | ORAL_SOLUTION | ORAL | Status: DC | PRN

## 2023-06-18 MED ORDER — METOCLOPRAMIDE HCL 5 MG/ML IJ SOLN
5.0000 mg | Freq: Three times a day (TID) | INTRAMUSCULAR | Status: DC | PRN
  Administered 2023-06-18: 10 mg via INTRAVENOUS
  Filled 2023-06-18: qty 2

## 2023-06-18 MED ORDER — ACETAMINOPHEN 10 MG/ML IV SOLN
1000.0000 mg | Freq: Once | INTRAVENOUS | Status: AC
  Administered 2023-06-18: 1000 mg via INTRAVENOUS
  Filled 2023-06-18: qty 100

## 2023-06-18 MED ORDER — STERILE WATER FOR IRRIGATION IR SOLN
Status: DC | PRN
  Administered 2023-06-18: 1000 mL

## 2023-06-18 MED ORDER — POLYETHYLENE GLYCOL 3350 17 G PO PACK: 17.0000 g | PACK | Freq: Every day | ORAL | Status: DC | PRN

## 2023-06-18 MED ORDER — FUROSEMIDE 20 MG PO TABS
20.0000 mg | ORAL_TABLET | Freq: Every day | ORAL | Status: DC
  Filled 2023-06-18: qty 1

## 2023-06-18 MED ORDER — POVIDONE-IODINE 10 % EX SWAB: 2.0000 | Freq: Once | CUTANEOUS | Status: DC

## 2023-06-18 MED ORDER — HYDROMORPHONE HCL 1 MG/ML IJ SOLN
0.5000 mg | INTRAMUSCULAR | Status: DC | PRN
  Administered 2023-06-18: 1 mg via INTRAVENOUS
  Administered 2023-06-18: 0.5 mg via INTRAVENOUS
  Administered 2023-06-19: 1 mg via INTRAVENOUS
  Filled 2023-06-18 (×4): qty 1

## 2023-06-18 MED ORDER — OXYCODONE HCL 5 MG PO TABS
5.0000 mg | ORAL_TABLET | ORAL | Status: DC | PRN
  Administered 2023-06-18: 5 mg via ORAL
  Administered 2023-06-18: 10 mg via ORAL
  Administered 2023-06-18: 5 mg via ORAL
  Administered 2023-06-19 (×4): 10 mg via ORAL
  Filled 2023-06-18: qty 2
  Filled 2023-06-18 (×2): qty 1
  Filled 2023-06-18 (×4): qty 2

## 2023-06-18 MED ORDER — BUPIVACAINE LIPOSOME 1.3 % IJ SUSP
INTRAMUSCULAR | Status: AC
  Filled 2023-06-18: qty 20

## 2023-06-18 MED ORDER — METOCLOPRAMIDE HCL 5 MG PO TABS: 5.0000 mg | ORAL_TABLET | Freq: Three times a day (TID) | ORAL | Status: DC | PRN

## 2023-06-18 MED ORDER — DEXAMETHASONE SODIUM PHOSPHATE 10 MG/ML IJ SOLN
10.0000 mg | Freq: Once | INTRAMUSCULAR | Status: AC
  Administered 2023-06-19: 10 mg via INTRAVENOUS
  Filled 2023-06-18: qty 1

## 2023-06-18 MED ORDER — FENTANYL CITRATE PF 50 MCG/ML IJ SOSY
50.0000 ug | PREFILLED_SYRINGE | INTRAMUSCULAR | Status: DC
  Administered 2023-06-18: 50 ug via INTRAVENOUS
  Filled 2023-06-18: qty 1

## 2023-06-18 MED ORDER — CEFAZOLIN SODIUM-DEXTROSE 2-4 GM/100ML-% IV SOLN
2.0000 g | Freq: Four times a day (QID) | INTRAVENOUS | Status: AC
  Administered 2023-06-18 (×2): 2 g via INTRAVENOUS
  Filled 2023-06-18 (×2): qty 100

## 2023-06-18 MED ORDER — PROPOFOL 500 MG/50ML IV EMUL
INTRAVENOUS | Status: DC | PRN
  Administered 2023-06-18: 100 ug/kg/min via INTRAVENOUS

## 2023-06-18 MED ORDER — SODIUM CHLORIDE (PF) 0.9 % IJ SOLN
INTRAMUSCULAR | Status: AC
  Filled 2023-06-18: qty 50

## 2023-06-18 MED ORDER — ROPINIROLE HCL 1 MG PO TABS
2.0000 mg | ORAL_TABLET | Freq: Every day | ORAL | Status: DC
  Administered 2023-06-18: 2 mg via ORAL
  Filled 2023-06-18: qty 2

## 2023-06-18 MED ORDER — TRAMADOL HCL 50 MG PO TABS
50.0000 mg | ORAL_TABLET | Freq: Four times a day (QID) | ORAL | Status: DC | PRN
  Administered 2023-06-19: 100 mg via ORAL
  Filled 2023-06-18: qty 2

## 2023-06-18 MED ORDER — LORATADINE 10 MG PO TABS
10.0000 mg | ORAL_TABLET | Freq: Every day | ORAL | Status: DC
Start: 2023-06-19 — End: 2023-06-19
  Administered 2023-06-19: 10 mg via ORAL
  Filled 2023-06-18: qty 1

## 2023-06-18 MED ORDER — ONDANSETRON HCL 4 MG/2ML IJ SOLN: 4.0000 mg | Freq: Once | INTRAMUSCULAR | Status: DC | PRN

## 2023-06-18 MED ORDER — ACETAMINOPHEN 500 MG PO TABS
1000.0000 mg | ORAL_TABLET | Freq: Four times a day (QID) | ORAL | Status: AC
  Administered 2023-06-18 – 2023-06-19 (×3): 1000 mg via ORAL
  Filled 2023-06-18 (×4): qty 2

## 2023-06-18 MED ORDER — FLEET ENEMA RE ENEM: 1.0000 | ENEMA | Freq: Once | RECTAL | Status: DC | PRN

## 2023-06-18 MED ORDER — SODIUM CHLORIDE (PF) 0.9 % IJ SOLN
INTRAMUSCULAR | Status: AC
  Filled 2023-06-18: qty 10

## 2023-06-18 MED ORDER — METHOCARBAMOL 1000 MG/10ML IJ SOLN: 500.0000 mg | Freq: Four times a day (QID) | INTRAMUSCULAR | Status: DC | PRN

## 2023-06-18 MED ORDER — CLONIDINE HCL (ANALGESIA) 100 MCG/ML EP SOLN
EPIDURAL | Status: DC | PRN
  Administered 2023-06-18: 100 ug

## 2023-06-18 MED ORDER — DEXAMETHASONE SODIUM PHOSPHATE 10 MG/ML IJ SOLN: 8.0000 mg | Freq: Once | INTRAMUSCULAR | Status: DC

## 2023-06-18 MED ORDER — ZOLPIDEM TARTRATE 5 MG PO TABS
5.0000 mg | ORAL_TABLET | Freq: Every evening | ORAL | Status: DC | PRN
  Filled 2023-06-18: qty 1

## 2023-06-18 MED ORDER — PANTOPRAZOLE SODIUM 40 MG PO TBEC
40.0000 mg | DELAYED_RELEASE_TABLET | Freq: Every day | ORAL | Status: DC
  Administered 2023-06-19: 40 mg via ORAL
  Filled 2023-06-18: qty 1

## 2023-06-18 MED ORDER — PROPOFOL 10 MG/ML IV BOLUS
INTRAVENOUS | Status: DC | PRN
  Administered 2023-06-18 (×4): 20 mg via INTRAVENOUS

## 2023-06-18 MED ORDER — SODIUM CHLORIDE (PF) 0.9 % IJ SOLN
INTRAMUSCULAR | Status: DC | PRN
  Administered 2023-06-18: 60 mL

## 2023-06-18 MED ORDER — ORAL CARE MOUTH RINSE: 15.0000 mL | Freq: Once | OROMUCOSAL | Status: AC

## 2023-06-18 MED ORDER — ALBUTEROL SULFATE (2.5 MG/3ML) 0.083% IN NEBU: 2.5000 mg | INHALATION_SOLUTION | Freq: Four times a day (QID) | RESPIRATORY_TRACT | Status: DC | PRN

## 2023-06-18 MED ORDER — BUPIVACAINE LIPOSOME 1.3 % IJ SUSP: 20.0000 mL | Freq: Once | INTRAMUSCULAR | Status: DC

## 2023-06-18 MED ORDER — DOCUSATE SODIUM 100 MG PO CAPS
100.0000 mg | ORAL_CAPSULE | Freq: Two times a day (BID) | ORAL | Status: DC
  Administered 2023-06-18 – 2023-06-19 (×2): 100 mg via ORAL
  Filled 2023-06-18 (×3): qty 1

## 2023-06-18 MED ORDER — LACTATED RINGERS IV SOLN: INTRAVENOUS | Status: DC

## 2023-06-18 MED ORDER — LACTATED RINGERS IV SOLN
INTRAVENOUS | Status: DC
Start: 2023-06-18 — End: 2023-06-18

## 2023-06-18 MED ORDER — SODIUM CHLORIDE 0.9 % IR SOLN
Status: DC | PRN
  Administered 2023-06-18: 1000 mL

## 2023-06-18 MED ORDER — SODIUM CHLORIDE 0.9 % IV SOLN: INTRAVENOUS | Status: DC | PRN

## 2023-06-18 MED ORDER — BISACODYL 10 MG RE SUPP: 10.0000 mg | Freq: Every day | RECTAL | Status: DC | PRN

## 2023-06-18 MED ORDER — CEFAZOLIN SODIUM-DEXTROSE 2-4 GM/100ML-% IV SOLN
2.0000 g | INTRAVENOUS | Status: AC
  Administered 2023-06-18: 2 g via INTRAVENOUS
  Filled 2023-06-18: qty 100

## 2023-06-18 MED ORDER — METOPROLOL TARTRATE 50 MG PO TABS
50.0000 mg | ORAL_TABLET | Freq: Two times a day (BID) | ORAL | Status: DC
  Filled 2023-06-18 (×2): qty 1

## 2023-06-18 MED ORDER — TRANEXAMIC ACID-NACL 1000-0.7 MG/100ML-% IV SOLN
1000.0000 mg | INTRAVENOUS | Status: AC
  Administered 2023-06-18: 1000 mg via INTRAVENOUS
  Filled 2023-06-18: qty 100

## 2023-06-18 MED ORDER — LEVOTHYROXINE SODIUM 100 MCG PO TABS
100.0000 ug | ORAL_TABLET | Freq: Every day | ORAL | Status: DC
  Administered 2023-06-19: 100 ug via ORAL
  Filled 2023-06-18: qty 1

## 2023-06-18 MED ORDER — APIXABAN 2.5 MG PO TABS
2.5000 mg | ORAL_TABLET | Freq: Two times a day (BID) | ORAL | Status: DC
  Administered 2023-06-19: 2.5 mg via ORAL
  Filled 2023-06-18: qty 1

## 2023-06-18 MED ORDER — 0.9 % SODIUM CHLORIDE (POUR BTL) OPTIME
TOPICAL | Status: DC | PRN
  Administered 2023-06-18: 1000 mL

## 2023-06-18 MED ORDER — PHENOL 1.4 % MT LIQD: 1.0000 | OROMUCOSAL | Status: DC | PRN

## 2023-06-18 SURGICAL SUPPLY — 45 items
ATTUNE MED DOME PAT 38 KNEE (Knees) IMPLANT
ATTUNE PSFEM RTSZ5 NARCEM KNEE (Femur) IMPLANT
ATTUNE PSRP INSR SZ5 8 KNEE (Insert) IMPLANT
BAG COUNTER SPONGE SURGICOUNT (BAG) IMPLANT
BAG ZIPLOCK 12X15 (MISCELLANEOUS) ×2 IMPLANT
BASE TIBIAL ROT PLAT SZ 5 KNEE (Knees) IMPLANT
BLADE SAG 18X100X1.27 (BLADE) ×2 IMPLANT
BLADE SAW SGTL 11.0X1.19X90.0M (BLADE) ×2 IMPLANT
BNDG ELASTIC 4X5.8 VLCR NS LF (GAUZE/BANDAGES/DRESSINGS) IMPLANT
BNDG ELASTIC 6INX 5YD STR LF (GAUZE/BANDAGES/DRESSINGS) ×2 IMPLANT
BOWL SMART MIX CTS (DISPOSABLE) ×2 IMPLANT
CEMENT HV SMART SET (Cement) ×4 IMPLANT
COVER SURGICAL LIGHT HANDLE (MISCELLANEOUS) ×2 IMPLANT
CUFF TRNQT CYL 34X4.125X (TOURNIQUET CUFF) ×2 IMPLANT
DERMABOND ADVANCED .7 DNX12 (GAUZE/BANDAGES/DRESSINGS) ×2 IMPLANT
DRAPE U-SHAPE 47X51 STRL (DRAPES) ×2 IMPLANT
DRSG AQUACEL AG ADV 3.5X10 (GAUZE/BANDAGES/DRESSINGS) ×2 IMPLANT
DURAPREP 26ML APPLICATOR (WOUND CARE) ×2 IMPLANT
ELECT REM PT RETURN 15FT ADLT (MISCELLANEOUS) ×2 IMPLANT
GLOVE BIO SURGEON STRL SZ 6.5 (GLOVE) IMPLANT
GLOVE BIO SURGEON STRL SZ8 (GLOVE) ×2 IMPLANT
GLOVE BIOGEL PI IND STRL 6.5 (GLOVE) IMPLANT
GLOVE BIOGEL PI IND STRL 7.0 (GLOVE) IMPLANT
GLOVE BIOGEL PI IND STRL 8 (GLOVE) ×2 IMPLANT
GOWN STRL REUS W/ TWL LRG LVL3 (GOWN DISPOSABLE) ×2 IMPLANT
HOLDER FOLEY CATH W/STRAP (MISCELLANEOUS) IMPLANT
IMMOBILIZER KNEE 20 (SOFTGOODS) ×1
IMMOBILIZER KNEE 20 THIGH 36 (SOFTGOODS) ×2 IMPLANT
KIT TURNOVER KIT A (KITS) IMPLANT
MANIFOLD NEPTUNE II (INSTRUMENTS) ×2 IMPLANT
NS IRRIG 1000ML POUR BTL (IV SOLUTION) ×2 IMPLANT
PACK TOTAL KNEE CUSTOM (KITS) ×2 IMPLANT
PADDING CAST COTTON 6X4 STRL (CAST SUPPLIES) ×4 IMPLANT
PIN STEINMAN FIXATION KNEE (PIN) IMPLANT
PROTECTOR NERVE ULNAR (MISCELLANEOUS) ×2 IMPLANT
SET HNDPC FAN SPRY TIP SCT (DISPOSABLE) ×2 IMPLANT
SUT MNCRL AB 4-0 PS2 18 (SUTURE) ×2 IMPLANT
SUT STRATAFIX 0 PDS 27 VIOLET (SUTURE) ×1
SUT VIC AB 2-0 CT1 TAPERPNT 27 (SUTURE) ×6 IMPLANT
SUTURE STRATFX 0 PDS 27 VIOLET (SUTURE) ×2 IMPLANT
TIBIAL BASE ROT PLAT SZ 5 KNEE (Knees) ×1 IMPLANT
TRAY FOLEY MTR SLVR 16FR STAT (SET/KITS/TRAYS/PACK) IMPLANT
TUBE SUCTION HIGH CAP CLEAR NV (SUCTIONS) ×2 IMPLANT
WATER STERILE IRR 1000ML POUR (IV SOLUTION) ×4 IMPLANT
WRAP KNEE MAXI GEL POST OP (GAUZE/BANDAGES/DRESSINGS) ×2 IMPLANT

## 2023-06-18 NOTE — Plan of Care (Signed)

## 2023-06-18 NOTE — Discharge Instructions (Signed)
Rebecca Gross, MD Total Joint Specialist [facility] [place] [address], [address] [place], [place] [address] [phone]  TOTAL KNEE REPLACEMENT POSTOPERATIVE DIRECTIONS    Knee Rehabilitation, Guidelines Following Surgery  Results after knee surgery are often greatly improved when you follow the exercise, range of motion and muscle strengthening exercises prescribed by your doctor. Safety measures are also important to protect the knee from further injury. If any of these exercises cause you to have increased pain or swelling in your knee joint, decrease the amount until you are comfortable again and slowly increase them. If you have problems or questions, call your caregiver or physical therapist for advice.   HOME CARE INSTRUCTIONS  Remove items at home which could result in a fall. This includes throw rugs or furniture in walking pathways.  ICE to the affected knee as much as tolerated. Icing helps control swelling. If the swelling is well controlled you will be more comfortable and rehab easier. Continue to use ice on the knee for pain and swelling from surgery. You may notice swelling that will progress down to the foot and ankle. This is normal after surgery. Elevate the leg when you are not up walking on it.    Continue to use the breathing machine which will help keep your temperature down. It is common for your temperature to cycle up and down following surgery, especially at night when you are not up moving around and exerting yourself. The breathing machine keeps your lungs expanded and your temperature down. Do not place pillow under the operative knee, focus on keeping the knee straight while resting  DIET You may resume your previous home diet once you are discharged from the hospital.  DRESSING / WOUND CARE / SHOWERING Keep your bulky bandage on for 2 days. On the third post-operative day you may remove the Ace bandage and gauze. There is a waterproof  adhesive bandage on your skin which will stay in place until your first follow-up appointment. Once you remove this you will not need to place another bandage You may begin showering 3 days following surgery, but do not submerge the incision under water.  ACTIVITY For the first 5 days, the key is rest and control of pain and swelling Do your home exercises twice a day starting on post-operative day 3. On the days you go to physical therapy, just do the home exercises once that day. You should rest, ice and elevate the leg for 50 minutes out of every hour. Get up and walk/stretch for 10 minutes per hour. After 5 days you can increase your activity slowly as tolerated. Walk with your walker as instructed. Use the walker until you are comfortable transitioning to a cane. Walk with the cane in the opposite hand of the operative leg. You may discontinue the cane once you are comfortable and walking steadily. Avoid periods of inactivity such as sitting longer than an hour when not asleep. This helps prevent blood clots.  You may discontinue the knee immobilizer once you are able to perform a straight leg raise while lying down. You may resume a sexual relationship in one month or when given the OK by your doctor.  You may return to work once you are cleared by your doctor.  Do not drive a car for 6 weeks or until released by your surgeon.  Do not drive while taking narcotics.  TED HOSE STOCKINGS Wear the elastic stockings on both legs for three weeks following surgery during the  day. You may remove them at night for sleeping.  WEIGHT BEARING Weight bearing as tolerated with assist device (walker, cane, etc) as directed, use it as long as suggested by your surgeon or therapist, typically at least 4-6 weeks.  POSTOPERATIVE CONSTIPATION PROTOCOL Constipation - defined medically as fewer than three stools per week and severe constipation as less than one stool per week.  One of the most common issues  patients have following surgery is constipation.  Even if you have a regular bowel pattern at home, your normal regimen is likely to be disrupted due to multiple reasons following surgery.  Combination of anesthesia, postoperative narcotics, change in appetite and fluid intake all can affect your bowels.  In order to avoid complications following surgery, here are some recommendations in order to help you during your recovery period.  Colace (docusate) - Pick up an over-the-counter form of Colace or another stool softener and take twice a day as long as you are requiring postoperative pain medications.  Take with a full glass of water daily.  If you experience loose stools or diarrhea, hold the colace until you stool forms back up. If your symptoms do not get better within 1 week or if they get worse, check with your doctor. Dulcolax (bisacodyl) - Pick up over-the-counter and take as directed by the product packaging as needed to assist with the movement of your bowels.  Take with a full glass of water.  Use this product as needed if not relieved by Colace only.  MiraLax (polyethylene glycol) - Pick up over-the-counter to have on hand. MiraLax is a solution that will increase the amount of water in your bowels to assist with bowel movements.  Take as directed and can mix with a glass of water, juice, soda, coffee, or tea. Take if you go more than two days without a movement. Do not use MiraLax more than once per day. Call your doctor if you are still constipated or irregular after using this medication for 7 days in a row.  If you continue to have problems with postoperative constipation, please contact the office for further assistance and recommendations.  If you experience "the worst abdominal pain ever" or develop nausea or vomiting, please contact the office immediatly for further recommendations for treatment.  ITCHING If you experience itching with your medications, try taking only a single pain  pill, or even half a pain pill at a time.  You can also use Benadryl over the counter for itching or also to help with sleep.   MEDICATIONS See your medication summary on the "After Visit Summary" that the nursing staff will review with you prior to discharge.  You may have some home medications which will be placed on hold until you complete the course of blood thinner medication.  It is important for you to complete the blood thinner medication as prescribed by your surgeon.  Continue your approved medications as instructed at time of discharge.  PRECAUTIONS If you experience chest pain or shortness of breath - call 911 immediately for transfer to the hospital emergency department.  If you develop a fever greater that 101 F, purulent drainage from wound, increased redness or drainage from wound, foul odor from the wound/dressing, or calf pain - CONTACT YOUR SURGEON.  FOLLOW-UP APPOINTMENTS Make sure you keep all of your appointments after your operation with your surgeon and caregivers. You should call the office at the above phone number and make an appointment for approximately two weeks after the date of your surgery or on the date instructed by your surgeon outlined in the "After Visit Summary".  RANGE OF MOTION AND STRENGTHENING EXERCISES  Rehabilitation of the knee is important following a knee injury or an operation. After just a few days of immobilization, the muscles of the thigh which control the knee become weakened and shrink (atrophy). Knee exercises are designed to build up the tone and strength of the thigh muscles and to improve knee motion. Often times heat used for twenty to thirty minutes before working out will loosen up your tissues and help with improving the range of motion but do not use heat for the first two weeks following surgery. These exercises can be done on a training (exercise) mat, on the floor, on a table or on a bed.  Use what ever works the best and is most comfortable for you Knee exercises include:  Leg Lifts - While your knee is still immobilized in a splint or cast, you can do straight leg raises. Lift the leg to 60 degrees, hold for 3 sec, and slowly lower the leg. Repeat 10-20 times 2-3 times daily. Perform this exercise against resistance later as your knee gets better.  Quad and Hamstring Sets - Tighten up the muscle on the front of the thigh (Quad) and hold for 5-10 sec. Repeat this 10-20 times hourly. Hamstring sets are done by pushing the foot backward against an object and holding for 5-10 sec. Repeat as with quad sets.  Leg Slides: Lying on your back, slowly slide your foot toward your buttocks, bending your knee up off the floor (only go as far as is comfortable). Then slowly slide your foot back down until your leg is flat on the floor again. Angel Wings: Lying on your back spread your legs to the side as far apart as you can without causing discomfort.  A rehabilitation program following serious knee injuries can speed recovery and prevent re-injury in the future due to weakened muscles. Contact your doctor or a physical therapist for more information on knee rehabilitation.   POST-OPERATIVE OPIOID TAPER INSTRUCTIONS: It is important to wean off of your opioid medication as soon as possible. If you do not need pain medication after your surgery it is ok to stop day one. Opioids include: Codeine, Hydrocodone(Norco, Vicodin), Oxycodone(Percocet, oxycontin) and hydromorphone amongst others.  Long term and even short term use of opiods can cause: Increased pain response Dependence Constipation Depression Respiratory depression And more.  Withdrawal symptoms can include Flu like symptoms Nausea, vomiting And more Techniques to manage these symptoms Hydrate well Eat regular healthy meals Stay active Use relaxation techniques(deep breathing, meditating, yoga) Do Not substitute Alcohol to help  with tapering If you have been on opioids for less than two weeks and do not have pain than it is ok to stop all together.  Plan to wean off of opioids This plan should start within one week post op of your joint replacement. Maintain the same interval or time between taking each dose and first decrease the dose.  Cut the total daily intake of opioids by one tablet each day Next start to increase the time between doses. The last dose that should be eliminated is the evening dose.   IF YOU ARE TRANSFERRED TO  A SKILLED REHAB FACILITY If the patient is transferred to a skilled rehab facility following release from the hospital, a list of the current medications will be sent to the facility for the patient to continue.  When discharged from the skilled rehab facility, please have the facility set up the patient's Home Health Physical Therapy prior to being released. Also, the skilled facility will be responsible for providing the patient with their medications at time of release from the facility to include their pain medication, the muscle relaxants, and their blood thinner medication. If the patient is still at the rehab facility at time of the two week follow up appointment, the skilled rehab facility will also need to assist the patient in arranging follow up appointment in our office and any transportation needs.  MAKE SURE YOU:  Understand these instructions.  Get help right away if you are not doing well or get worse.   DENTAL ANTIBIOTICS:  In most cases prophylactic antibiotics for Dental procdeures after total joint surgery are not necessary.  Exceptions are as follows:  1. History of prior total joint infection  2. Severely immunocompromised (Organ Transplant, cancer chemotherapy, Rheumatoid biologic meds such as Humera)  3. Poorly controlled diabetes (A1C &gt; 8.0, blood glucose over 200)  If you have one of these conditions, contact your surgeon for an antibiotic prescription,  prior to your dental procedure.    Pick up stool softner and laxative for home use following surgery while on pain medications. Do not submerge incision under water. Please use good hand washing techniques while changing dressing each day. May shower starting three days after surgery. Please use a clean towel to pat the incision dry following showers. Continue to use ice for pain and swelling after surgery. Do not use any lotions or creams on the incision until instructed by your surgeon.

## 2023-06-18 NOTE — Interval H&P Note (Signed)
History and Physical Interval Note:  06/18/2023 8:46 AM  Rebecca Brooks  has presented today for surgery, with the diagnosis of right knee osteoarthritis.  The various methods of treatment have been discussed with the patient and family. After consideration of risks, benefits and other options for treatment, the patient has consented to  Procedure(s): TOTAL KNEE ARTHROPLASTY (Right) as a surgical intervention.  The patient's history has been reviewed, patient examined, no change in status, stable for surgery.  I have reviewed the patient's chart and labs.  Questions were answered to the patient's satisfaction.     Homero Fellers Stanly Si

## 2023-06-18 NOTE — Evaluation (Signed)
Physical Therapy Evaluation Patient Details Name: Rebecca Brooks MRN: [id number] DOB: [birth year] Today's Date: 06/18/2023  History of Present Illness  72 yo female  s/p RTKA on 06/18/23. PMH: gout,murmur,lmbar surg. OSA, RTHA,angina, HTN  Clinical Impression  The patient reporting 8/10 pain in right  leg. Patient  has been premedicated and received muscle relaxer  prior.  Patient did mobilize and stand and able to side steps along bed using RW. Patient should progress to Dc home tomorrow. Pt admitted with above diagnosis.  Pt currently with functional limitations due to the deficits listed below (see PT Problem List). Pt will benefit from acute skilled PT to increase their independence and safety with mobility to allow discharge.           If plan is discharge home, recommend the following: A little help with walking and/or transfers;Assist for transportation;Assistance with cooking/housework;Help with stairs or ramp for entrance   Can travel by private vehicle        Equipment Recommendations None recommended by PT  Recommendations for Other Services       Functional Status Assessment Patient has had a recent decline in their functional status and demonstrates the ability to make significant improvements in function in a reasonable and predictable amount of time.     Precautions / Restrictions Precautions Precautions: Fall;Knee Restrictions Weight Bearing Restrictions: No      Mobility  Bed Mobility Overal bed mobility: Needs Assistance Bed Mobility: Supine to Sit     Supine to sit: Min assist     General bed mobility comments: support right leg    Transfers Overall transfer level: Needs assistance Equipment used: Rolling walker (2 wheels) Transfers: Sit to/from Stand Sit to Stand: Min assist           General transfer comment: cues for hand placement    Ambulation/Gait Ambulation/Gait assistance: Min assist   Assistive device: Rolling walker (2  wheels) Gait Pattern/deviations: Step-to pattern       General Gait Details: side step along bed x 4 x 2  Stairs            Wheelchair Mobility     Tilt Bed    Modified Rankin (Stroke Patients Only)       Balance Overall balance assessment: Mild deficits observed, not formally tested                                           Pertinent Vitals/Pain Pain Assessment Pain Assessment: 0-10 Pain Score: 8  Pain Location: right knee Pain Descriptors / Indicators: Discomfort, Aching, Burning Pain Intervention(s): Monitored during session, Premedicated before session, Ice applied    Home Living Family/patient expects to be discharged to:: Private residence Living Arrangements: Spouse/significant other Available Help at Discharge: Family Type of Home: House Home Access: Ramped entrance       Home Layout: One level Home Equipment: Agricultural consultant (2 wheels);Cane - single point;BSC/3in1;Tub bench;Adaptive equipment      Prior Function Prior Level of Function : Independent/Modified Independent             Mobility Comments: mod I at RW level with all ADLs, self care tasks, and IADLs       Extremity/Trunk Assessment   Upper Extremity Assessment Upper Extremity Assessment: Overall WFL for tasks assessed    Lower Extremity Assessment Lower Extremity Assessment: RLE deficits/detail RLE Deficits / Details: + sLR,  knee flex to ~ 60    Cervical / Trunk Assessment Cervical / Trunk Assessment: Normal  Communication   Communication Communication: No apparent difficulties  Cognition Arousal: Alert Behavior During Therapy: WFL for tasks assessed/performed Overall Cognitive Status: Within Functional Limits for tasks assessed                                          General Comments      Exercises Total Joint Exercises Ankle Circles/Pumps: AROM, Right Quad Sets: AROM, Right   Assessment/Plan    PT Assessment Patient  needs continued PT services  PT Problem List Decreased strength;Decreased mobility;Decreased safety awareness;Decreased range of motion;Decreased activity tolerance;Decreased balance;Pain       PT Treatment Interventions DME instruction;Therapeutic activities;Gait training;Therapeutic exercise;Patient/family education;Functional mobility training    PT Goals (Current goals can be found in the Care Plan section)  Acute Rehab PT Goals Patient Stated Goal: to go home PT Goal Formulation: With patient/family Time For Goal Achievement: 06/25/23 Potential to Achieve Goals: Good    Frequency 7X/week     Co-evaluation               AM-PAC PT "6 Clicks" Mobility  Outcome Measure Help needed turning from your back to your side while in a flat bed without using bedrails?: A Little Help needed moving from lying on your back to sitting on the side of a flat bed without using bedrails?: A Little Help needed moving to and from a bed to a chair (including a wheelchair)?: A Little Help needed standing up from a chair using your arms (e.g., wheelchair or bedside chair)?: A Little Help needed to walk in hospital room?: A Lot Help needed climbing 3-5 steps with a railing? : A Lot 6 Click Score: 16    End of Session Equipment Utilized During Treatment: Gait belt Activity Tolerance: Patient tolerated treatment well Patient left: in bed;with bed alarm set;with family/visitor present Nurse Communication: Mobility status PT Visit Diagnosis: Unsteadiness on feet (R26.81);Pain Pain - Right/Left: Left Pain - part of body: Knee    Time: 6578-4696 PT Time Calculation (min) (ACUTE ONLY): 23 min   Charges:   PT Evaluation $PT Eval Low Complexity: 1 Low PT Treatments $Gait Training: 8-22 mins PT General Charges $$ ACUTE PT VISIT: 1 Visit         Blanchard Kelch PT Acute Rehabilitation Services Office [phone] Weekend [phone]   Rada Hay 06/18/2023, 5:21 PM

## 2023-06-18 NOTE — Op Note (Signed)
OPERATIVE REPORT-TOTAL KNEE ARTHROPLASTY   Pre-operative diagnosis- Osteoarthritis  Right knee(s)  Post-operative diagnosis- Osteoarthritis Right knee(s)  Procedure-  Right  Total Knee Arthroplasty  Surgeon- Gus Rankin. Tanja Gift, MD  Assistant- Arcola Jansky, PA-C   Anesthesia-   Adductor canal block and spinal  EBL- 25 ml   Drains None  Tourniquet time-  Total Tourniquet Time Documented: Thigh (Right) - 31 minutes Total: Thigh (Right) - 31 minutes     Complications- None  Condition-PACU - hemodynamically stable.   Brief Clinical Note  Rebecca Brooks is a 72 y.o. year old female with end stage OA of her right knee with progressively worsening pain and dysfunction. She has constant pain, with activity and at rest and significant functional deficits with difficulties even with ADLs. She has had extensive non-op management including analgesics, injections of cortisone and viscosupplements, and home exercise program, but remains in significant pain with significant dysfunction.Radiographs show bone on bone arthritis all 3 compartments. She presents now for right Total Knee Arthroplasty.     Procedure in detail---   The patient is brought into the operating room and positioned supine on the operating table. After successful administration of  Adductor canal block and spinal,   a tourniquet is placed high on the  Right thigh(s) and the lower extremity is prepped and draped in the usual sterile fashion. Time out is performed by the operating team and then the  Right lower extremity is wrapped in Esmarch, knee flexed and the tourniquet inflated to 300 mmHg.       A midline incision is made with a ten blade through the subcutaneous tissue to the level of the extensor mechanism. A fresh blade is used to make a medial parapatellar arthrotomy. Soft tissue over the proximal medial tibia is subperiosteally elevated to the joint line with a knife and into the semimembranosus bursa with a  Cobb elevator. Soft tissue over the proximal lateral tibia is elevated with attention being paid to avoiding the patellar tendon on the tibial tubercle. The patella is everted, knee flexed 90 degrees and the ACL and PCL are removed. Findings are bone on bone all 3 compartments with massive global osteophytes        The drill is used to create a starting hole in the distal femur and the canal is thoroughly irrigated with sterile saline to remove the fatty contents. The 5 degree Right  valgus alignment guide is placed into the femoral canal and the distal femoral cutting block is pinned to remove 9 mm off the distal femur. Resection is made with an oscillating saw.      The tibia is subluxed forward and the menisci are removed. The extramedullary alignment guide is placed referencing proximally at the medial aspect of the tibial tubercle and distally along the second metatarsal axis and tibial crest. The block is pinned to remove 2mm off the more deficient medial  side. Resection is made with an oscillating saw. Size 5is the most appropriate size for the tibia and the proximal tibia is prepared with the modular drill and keel punch for that size.      The femoral sizing guide is placed and size 5 is most appropriate. Rotation is marked off the epicondylar axis and confirmed by creating a rectangular flexion gap at 90 degrees. The size 5 cutting block is pinned in this rotation and the anterior, posterior and chamfer cuts are made with the oscillating saw. The intercondylar block is then placed and that cut is  made.      Trial size 5 tibial component, trial size 5 narrow posterior stabilized femur and a 8  mm posterior stabilized rotating platform insert trial is placed. Full extension is achieved with excellent varus/valgus and anterior/posterior balance throughout full range of motion. The patella is everted and thickness measured to be 22  mm. Free hand resection is taken to 12 mm, a 38 template is placed, lug  holes are drilled, trial patella is placed, and it tracks normally. Osteophytes are removed off the posterior femur with the trial in place. All trials are removed and the cut bone surfaces prepared with pulsatile lavage. Cement is mixed and once ready for implantation, the size 5 tibial implant, size  5 narrow posterior stabilized femoral component, and the size 38 patella are cemented in place and the patella is held with the clamp. The trial insert is placed and the knee held in full extension. The Exparel (20 ml mixed with 60 ml saline) is injected into the extensor mechanism, posterior capsule, medial and lateral gutters and subcutaneous tissues.  All extruded cement is removed and once the cement is hard the permanent 8 mm posterior stabilized rotating platform insert is placed into the tibial tray.      The wound is copiously irrigated with saline solution and the extensor mechanism closed with # 0 Stratofix suture. The tourniquet is released for a total tourniquet time of 31  minutes. Flexion against gravity is 140 degrees and the patella tracks normally. Subcutaneous tissue is closed with 2.0 vicryl and subcuticular with running 4.0 Monocryl. The incision is cleaned and dried and steri-strips and a bulky sterile dressing are applied. The limb is placed into a knee immobilizer and the patient is awakened and transported to recovery in stable condition.      Please note that a surgical assistant was a medical necessity for this procedure in order to perform it in a safe and expeditious manner. Surgical assistant was necessary to retract the ligaments and vital neurovascular structures to prevent injury to them and also necessary for proper positioning of the limb to allow for anatomic placement of the prosthesis.   Gus Rankin Avanthika Dehnert, MD    06/18/2023, 11:42 AM

## 2023-06-18 NOTE — Transfer of Care (Signed)
Immediate Anesthesia Transfer of Care Note  Patient: Rebecca Brooks  Procedure(s) Performed: TOTAL KNEE ARTHROPLASTY (Right: Knee)  Patient Location: PACU  Anesthesia Type:Spinal  Level of Consciousness: awake, alert , and oriented  Airway & Oxygen Therapy: Patient Spontanous Breathing  Post-op Assessment: Report given to RN and Post -op Vital signs reviewed and stable  Post vital signs: Reviewed and stable  Last Vitals:  Vitals Value Taken Time  BP 123/69 06/18/23 1208  Temp    Pulse 70 06/18/23 1209  Resp 14 06/18/23 1209  SpO2 99 % 06/18/23 1209  Vitals shown include unfiled device data.  Last Pain:  Vitals:   06/18/23 1000  TempSrc:   PainSc: 0-No pain         Complications: No notable events documented.

## 2023-06-18 NOTE — Anesthesia Procedure Notes (Signed)
Anesthesia Procedure Image       

## 2023-06-18 NOTE — Anesthesia Postprocedure Evaluation (Signed)
Anesthesia Post Note  Patient: Rebecca Brooks  Procedure(s) Performed: TOTAL KNEE ARTHROPLASTY (Right: Knee)     Patient location during evaluation: PACU Anesthesia Type: Spinal Level of consciousness: oriented and awake and alert Pain management: pain level controlled Vital Signs Assessment: post-procedure vital signs reviewed and stable Respiratory status: spontaneous breathing, respiratory function stable and patient connected to nasal cannula oxygen Cardiovascular status: blood pressure returned to baseline and stable Postop Assessment: no headache, no backache and no apparent nausea or vomiting Anesthetic complications: no  No notable events documented.  Last Vitals:  Vitals:   06/18/23 1300 06/18/23 1315  BP: 136/74 136/77  Pulse: (!) 47 (!) 47  Resp: 10 12  Temp:    SpO2: 100% 98%    Last Pain:  Vitals:   06/18/23 1300  TempSrc:   PainSc: 0-No pain                 Mallisa Alameda S

## 2023-06-18 NOTE — Progress Notes (Signed)
Orthopedic Tech Progress Note Patient Details:  Rebecca Brooks Mar 25, 1951 [id number]  CPM Right Knee CPM Right Knee: Off Right Knee Flexion (Degrees): 40 Right Knee Extension (Degrees): 10  Post Interventions Patient Tolerated: Well  Darleen Crocker 06/18/2023, 3:46 PM

## 2023-06-18 NOTE — Progress Notes (Signed)
Orthopedic Tech Progress Note Patient Details:  Rebecca Brooks 1950/09/02 [id number]  CPM Right Knee CPM Right Knee: On Right Knee Flexion (Degrees): 40 Right Knee Extension (Degrees): 10  Post Interventions Patient Tolerated: Well  Darleen Crocker 06/18/2023, 12:25 PM

## 2023-06-18 NOTE — Anesthesia Procedure Notes (Signed)
Anesthesia Regional Block: Adductor canal block   Pre-Anesthetic Checklist: , timeout performed,  Correct Patient, Correct Site, Correct Laterality,  Correct Procedure, Correct Position, site marked,  Risks and benefits discussed,  Surgical consent,  Pre-op evaluation,  At surgeon's request and post-op pain management  Laterality: Right  Prep: chloraprep       Needles:  Injection technique: Single-shot  Needle Type: Echogenic Needle     Needle Length: 9cm      Additional Needles:   Procedures:,,,, ultrasound used (permanent image in chart),,    Narrative:  Start time: 06/18/2023 9:57 AM End time: 06/18/2023 10:05 AM Injection made incrementally with aspirations every 5 mL.  Performed by: Personally  Anesthesiologist: Eilene Ghazi, MD  Additional Notes: Patient tolerated the procedure well without complications

## 2023-06-19 ENCOUNTER — Encounter (HOSPITAL_COMMUNITY): Payer: Self-pay | Admitting: Orthopedic Surgery

## 2023-06-19 DIAGNOSIS — Z87891 Personal history of nicotine dependence: Secondary | ICD-10-CM | POA: Diagnosis not present

## 2023-06-19 DIAGNOSIS — Z7901 Long term (current) use of anticoagulants: Secondary | ICD-10-CM | POA: Diagnosis not present

## 2023-06-19 DIAGNOSIS — E039 Hypothyroidism, unspecified: Secondary | ICD-10-CM | POA: Diagnosis not present

## 2023-06-19 DIAGNOSIS — Z79899 Other long term (current) drug therapy: Secondary | ICD-10-CM | POA: Diagnosis not present

## 2023-06-19 DIAGNOSIS — I10 Essential (primary) hypertension: Secondary | ICD-10-CM | POA: Diagnosis not present

## 2023-06-19 DIAGNOSIS — J45909 Unspecified asthma, uncomplicated: Secondary | ICD-10-CM | POA: Diagnosis not present

## 2023-06-19 DIAGNOSIS — M1711 Unilateral primary osteoarthritis, right knee: Secondary | ICD-10-CM | POA: Diagnosis not present

## 2023-06-19 DIAGNOSIS — I4891 Unspecified atrial fibrillation: Secondary | ICD-10-CM | POA: Diagnosis not present

## 2023-06-19 DIAGNOSIS — Z96641 Presence of right artificial hip joint: Secondary | ICD-10-CM | POA: Diagnosis not present

## 2023-06-19 DIAGNOSIS — Z96652 Presence of left artificial knee joint: Secondary | ICD-10-CM | POA: Diagnosis not present

## 2023-06-19 DIAGNOSIS — Z8616 Personal history of COVID-19: Secondary | ICD-10-CM | POA: Diagnosis not present

## 2023-06-19 DIAGNOSIS — Z85828 Personal history of other malignant neoplasm of skin: Secondary | ICD-10-CM | POA: Diagnosis not present

## 2023-06-19 LAB — BASIC METABOLIC PANEL
Anion gap: 7 (ref 5–15)
BUN: 25 mg/dL — ABNORMAL HIGH (ref 8–23)
CO2: 25 mmol/L (ref 22–32)
Calcium: 8.6 mg/dL — ABNORMAL LOW (ref 8.9–10.3)
Chloride: 109 mmol/L (ref 98–111)
Creatinine, Ser: 0.69 mg/dL (ref 0.44–1.00)
GFR, Estimated: >60 mL/min (ref >60)
Glucose, Bld: 122 mg/dL — ABNORMAL HIGH (ref 70–99)
Potassium: 4.8 mmol/L (ref 3.5–5.1)
Sodium: 141 mmol/L (ref 135–145)

## 2023-06-19 LAB — CBC
HCT: 35.1 % — ABNORMAL LOW (ref 36.0–46.0)
Hemoglobin: 11.3 g/dL — ABNORMAL LOW (ref 12.0–15.0)
MCH: 31.7 pg (ref 26.0–34.0)
MCHC: 32.2 g/dL (ref 30.0–36.0)
MCV: 98.6 fL (ref 80.0–100.0)
Platelets: 239 10*3/uL (ref 150–400)
RBC: 3.56 MIL/uL — ABNORMAL LOW (ref 3.87–5.11)
RDW: 13.4 % (ref 11.5–15.5)
WBC: 12.8 10*3/uL — ABNORMAL HIGH (ref 4.0–10.5)
nRBC: 0 % (ref 0.0–0.2)

## 2023-06-19 MED ORDER — METHOCARBAMOL 500 MG PO TABS: 500.0000 mg | ORAL_TABLET | Freq: Four times a day (QID) | ORAL | 0 refills | Status: DC | PRN

## 2023-06-19 MED ORDER — OXYCODONE HCL 5 MG PO TABS: 5.0000 mg | ORAL_TABLET | Freq: Four times a day (QID) | ORAL | 0 refills | Status: DC | PRN

## 2023-06-19 MED ORDER — TRAMADOL HCL 50 MG PO TABS: 50.0000 mg | ORAL_TABLET | Freq: Four times a day (QID) | ORAL | 0 refills | Status: DC | PRN

## 2023-06-19 NOTE — Progress Notes (Signed)
Physical Therapy Treatment Patient Details Name: Rebecca Brooks MRN: [id number] DOB: 1950/09/05 Today's Date: 06/19/2023   History of Present Illness 72 yo female  s/p RTKA on 06/18/23. PMH: gout,murmur,lmbar surg. OSA, RTHA,angina, HTN    PT Comments  The patient reports having a nice nap. Patient  requires min assistance for  bed mobility,. Ambulated x 50' with RW, gait slow.  Patient plans top DC home today after PT. Sister has come to stay and assist patient at Dc.    If plan is discharge home, recommend the following: A little help with walking and/or transfers;Assist for transportation;Assistance with cooking/housework;Help with stairs or ramp for entrance   Can travel by private vehicle        Equipment Recommendations  None recommended by PT    Recommendations for Other Services       Precautions / Restrictions Precautions Precautions: Fall;Knee Restrictions Weight Bearing Restrictions: No     Mobility  Bed Mobility   Bed Mobility: Supine to Sit     Supine to sit: Min assist     General bed mobility comments: support right leg and trunk    Transfers Overall transfer level: Needs assistance Equipment used: Rolling walker (2 wheels) Transfers: Sit to/from Stand, Bed to chair/wheelchair/BSC Sit to Stand: Min assist           General transfer comment: cues for hand placement, extra effort to stand from bed, min assist to  step to [facility] using RW    Ambulation/Gait Ambulation/Gait assistance: Min assist Gait Distance (Feet): 50 Feet Assistive device: Rolling walker (2 wheels) Gait Pattern/deviations: Step-to pattern, Step-through pattern, Antalgic Gait velocity: decr     General Gait Details: slow  to step, no KI needed   Optometrist     Tilt Bed    Modified Rankin (Stroke Patients Only)       Balance Overall balance assessment: Mild deficits observed, not formally tested                                           Cognition Arousal: Alert                                              Exercises      General Comments        Pertinent Vitals/Pain Pain Assessment Pain Score: 7  Pain Location: right knee Pain Descriptors / Indicators: Discomfort, Aching Pain Intervention(s): Monitored during session, Premedicated before session, Ice applied    Home Living                          Prior Function            PT Goals (current goals can now be found in the care plan section) Progress towards PT goals: Progressing toward goals    Frequency    7X/week      PT Plan      Co-evaluation              AM-PAC PT "6 Clicks" Mobility   Outcome Measure  Help needed turning from your back to your side while in a flat bed without using bedrails?:  A Little Help needed moving from lying on your back to sitting on the side of a flat bed without using bedrails?: A Little Help needed moving to and from a bed to a chair (including a wheelchair)?: A Little Help needed standing up from a chair using your arms (e.g., wheelchair or bedside chair)?: A Little Help needed to walk in hospital room?: A Little Help needed climbing 3-5 steps with a railing? : A Lot 6 Click Score: 17    End of Session Equipment Utilized During Treatment: Gait belt Activity Tolerance: Patient tolerated treatment well Patient left: in chair;with call bell/phone within reach;with nursing/sitter in room Nurse Communication: Mobility status PT Visit Diagnosis: Unsteadiness on feet (R26.81);Pain Pain - Right/Left: Right Pain - part of body: Knee     Time: 1104-1130 PT Time Calculation (min) (ACUTE ONLY): 26 min  Charges:    $Gait Training: 8-22 mins $Self Care/Home Management: 8-22 PT General Charges $$ ACUTE PT VISIT: 1 Visit                     Blanchard Kelch PT Acute Rehabilitation Services Office [phone] Weekend  pager-[phone]    Rada Hay 06/19/2023, 1:36 PM

## 2023-06-19 NOTE — Progress Notes (Signed)
Subjective: 1 Day Post-Op Procedure(s) (LRB): TOTAL KNEE ARTHROPLASTY (Right) Patient reports pain as moderate.   Patient seen in rounds by Dr. Lequita Halt. Patient had issues with pain last night, improved this morning. Denies chest pain, SOB, or calf pain. Foley catheter removed this AM.  We will continue therapy today  Objective: Vital signs in last 24 hours: Temp:  [96.7 F (35.9 C)-98.2 F (36.8 C)] 98.2 F (36.8 C) (11/26 0625) Pulse Rate:  [47-96] 96 (11/26 0625) Resp:  [10-24] 17 (11/26 0625) BP: (106-153)/(52-77) 139/72 (11/26 0625) SpO2:  [90 %-100 %] 96 % (11/26 0625) FiO2 (%):  [21 %] 21 % (11/25 2250) Weight:  [85.7 kg] 85.7 kg (11/25 0854)  Intake/Output from previous day:  Intake/Output Summary (Last 24 hours) at 06/19/2023 0845 Last data filed at 06/19/2023 4034 Gross per 24 hour  Intake 3829.89 ml  Output 1722 ml  Net 2107.89 ml     Intake/Output this shift: No intake/output data recorded.  Labs: Recent Labs    06/19/23 0332  HGB 11.3*   Recent Labs    06/19/23 0332  WBC 12.8*  RBC 3.56*  HCT 35.1*  PLT 239   Recent Labs    06/19/23 0332  NA 141  K 4.8  CL 109  CO2 25  BUN 25*  CREATININE 0.69  GLUCOSE 122*  CALCIUM 8.6*   No results for input(s): "LABPT", "INR" in the last 72 hours.  Exam: General - Patient is Alert and Oriented Extremity - Neurologically intact Neurovascular intact Sensation intact distally Dorsiflexion/Plantar flexion intact Dressing - dressing C/D/I Motor Function - intact, moving foot and toes well on exam.   Past Medical History:  Diagnosis Date   Abnormal nuclear stress test    False positive. Cath 08/23/15 showed Angiographically normal coronary arteries   Anemia    Ankle fracture    Arthritis    Asthma    "asthmatic bronchitis"   Atrial fibrillation (HCC)    Back pain    Chicken pox as a child   Edema of both lower extremities    Essential hypertension 07/17/2011   ACEI d/c 06/2011 for  refractory cough> resolved    GERD (gastroesophageal reflux disease)    Hyperlipidemia    Hypertension    Hypothyroidism    Insomnia 11/01/2014   Insulin resistance    pt reports she was told this in the past, does not take any medication for this   Joint pain    Low back pain radiating to right leg    intermittent and occasional numbness of skin over right hip in certain positions   Measles as a child   Mumps as a child   Obesity    Osteoarthritis    Palpitation    Pneumonia    this October 2013 from which she has said inhailer   PONV (postoperative nausea and vomiting)    "violent vomiting after surgery"   Recurrent epistaxis 02/20/2012   RLS (restless legs syndrome) 11/01/2014   Skin cancer    Sleep apnea    cpap   SOB (shortness of breath) on exertion    Spinal stenosis    Vitamin B 12 deficiency    Vitamin D deficiency     Assessment/Plan: 1 Day Post-Op Procedure(s) (LRB): TOTAL KNEE ARTHROPLASTY (Right) Principal Problem:   OA (osteoarthritis) of knee Active Problems:   Primary osteoarthritis of right knee  Estimated body mass index is 36.01 kg/m as calculated from the following:   Height as of this  encounter: 5' 0.75" (1.543 m).   Weight as of this encounter: 85.7 kg. Advance diet Up with therapy D/C IV fluids  Patient's anticipated LOS is less than 2 midnights, meeting these requirements: - Lives within 1 hour of care - Has a competent adult at home to recover with post-op recover - NO history of             - Chronic pain requiring opioids             - Diabetes             - Coronary Artery Disease             - Heart failure             - Heart attack             - Stroke             - DVT/VTE             - Respiratory Failure/COPD             - Renal failure             - Anemia             - Advanced Liver disease     DVT Prophylaxis -  Eliquis Weight bearing as tolerated. Continue therapy.  Plan is to go Home after hospital  stay. Plan for discharge later today if progresses with therapy and meeting goals. Scheduled for OPPT at [facility]. Follow-up in the office in 2 weeks.  The PDMP database was reviewed today prior to any opioid medications being prescribed to this patient.  Arther Abbott, PA-C Orthopedic Surgery [phone] 06/19/2023, 8:45 AM

## 2023-06-19 NOTE — Progress Notes (Signed)
Physical Therapy Treatment Patient Details Name: Rebecca Brooks MRN: [id number] DOB: 11-10-1950 Today's Date: 06/19/2023   History of Present Illness 72 yo female  s/p RTKA on 06/18/23. PMH: gout,murmur,lmbar surg. OSA, RTHA,angina, HTN    PT Comments  The patient reports desire to  DC home, patient given  pain medication prior, able  to transfer to [facility] then ambulate 50' using RW. The patient's sister present and observed and questions were answered.   Patient has met goals for DC home   If plan is discharge home, recommend the following: A little help with walking and/or transfers;Assist for transportation;Assistance with cooking/housework;Help with stairs or ramp for entrance   Can travel by private vehicle        Equipment Recommendations  None recommended by PT    Recommendations for Other Services       Precautions / Restrictions Precautions Precautions: Fall;Knee Restrictions Weight Bearing Restrictions: No     Mobility  Bed Mobility   Bed Mobility: Supine to Sit, Sit to Supine     Supine to sit: Min assist Sit to supine: Min assist   General bed mobility comments: pt. using belt to self move RLE, sister present    Transfers Overall transfer level: Needs assistance Equipment used: Rolling walker (2 wheels) Transfers: Sit to/from Stand, Bed to chair/wheelchair/BSC Sit to Stand: Min assist           General transfer comment: cues for hand placement, extra effort to stand from bed, min assist to  step to [facility] using RW    Ambulation/Gait Ambulation/Gait assistance: Min assist Gait Distance (Feet): 50 Feet Assistive device: Rolling walker (2 wheels) Gait Pattern/deviations: Step-to pattern, Step-through pattern, Antalgic Gait velocity: decr     General Gait Details: slow  to step, no KI needed   Optometrist     Tilt Bed    Modified Rankin (Stroke Patients Only)       Balance Overall balance  assessment: Mild deficits observed, not formally tested                                          Cognition Arousal: Alert                                              Exercises Total Joint Exercises Ankle Circles/Pumps: AROM Quad Sets: AROM Heel Slides: AAROM, Right, 10 reps Hip ABduction/ADduction: AROM, Right, 10 reps Goniometric ROM: 10-60 right knee flex    General Comments        Pertinent Vitals/Pain Pain Assessment Pain Score: 6  Pain Location: right knee Pain Descriptors / Indicators: Discomfort, Aching Pain Intervention(s): Monitored during session, Premedicated before session    Home Living                          Prior Function            PT Goals (current goals can now be found in the care plan section) Progress towards PT goals: Progressing toward goals    Frequency    7X/week      PT Plan      Co-evaluation  AM-PAC PT "6 Clicks" Mobility   Outcome Measure  Help needed turning from your back to your side while in a flat bed without using bedrails?: A Little Help needed moving from lying on your back to sitting on the side of a flat bed without using bedrails?: A Little Help needed moving to and from a bed to a chair (including a wheelchair)?: A Little Help needed standing up from a chair using your arms (e.g., wheelchair or bedside chair)?: A Little Help needed to walk in hospital room?: A Little Help needed climbing 3-5 steps with a railing? : A Little 6 Click Score: 18    End of Session Equipment Utilized During Treatment: Gait belt Activity Tolerance: Patient tolerated treatment well Patient left: with call bell/phone within reach;in bed;with family/visitor present Nurse Communication: Mobility status PT Visit Diagnosis: Unsteadiness on feet (R26.81);Pain Pain - Right/Left: Right Pain - part of body: Knee     Time: 1440-1503 PT Time Calculation (min) (ACUTE ONLY):  23 min  Charges:    $Gait Training: 8-22 mins $$Self Care/Home Management: 8-22 PT General Charges $$ ACUTE PT VISIT: 1 Visit                     Blanchard Kelch PT Acute Rehabilitation Services Office [phone] Weekend pager-[phone]    Rada Hay 06/19/2023, 3:59 PM

## 2023-06-19 NOTE — Progress Notes (Signed)
Patient discharged to home,. AVS reviewed

## 2023-06-19 NOTE — Progress Notes (Signed)
Physical Therapy Treatment Patient Details Name: Rebecca Brooks MRN: [id number] DOB: Sep 11, 1950 Today's Date: 06/19/2023   History of Present Illness 72 yo female  s/p RTKA on 06/18/23. PMH: gout,murmur,lmbar surg. OSA, RTHA,angina, HTN    PT Comments  Patient  performed  Exercises in recliner. Will return for  1 more ambulation  with RW, then plans to Dc home.    If plan is discharge home, recommend the following: A little help with walking and/or transfers;Assist for transportation;Assistance with cooking/housework;Help with stairs or ramp for entrance   Can travel by private vehicle        Equipment Recommendations  None recommended by PT    Recommendations for Other Services       Precautions / Restrictions Precautions Precautions: Fall;Knee Restrictions Weight Bearing Restrictions: No     Mobility              Stairs             Wheelchair Mobility     Tilt Bed    Modified Rankin (Stroke Patients Only)       Balance Overall balance assessment: Mild deficits observed, not formally tested                                          Cognition Arousal: Alert                                              Exercises Total Joint Exercises Ankle Circles/Pumps: AROM Quad Sets: AROM Heel Slides: AAROM, Right, 10 reps Hip ABduction/ADduction: AROM, Right, 10 reps Goniometric ROM: 10-60 right knee flex    General Comments        Pertinent Vitals/Pain Pain Assessment Pain Score: 6  Pain Location: right knee Pain Descriptors / Indicators: Discomfort, Aching Pain Intervention(s): Monitored during session, Premedicated before session    Home Living                          Prior Function            PT Goals (current goals can now be found in the care plan section) Progress towards PT goals: Progressing toward goals    Frequency    7X/week      PT Plan      Co-evaluation               AM-PAC PT "6 Clicks" Mobility   Outcome Measure  Help needed turning from your back to your side while in a flat bed without using bedrails?: A Little Help needed moving from lying on your back to sitting on the side of a flat bed without using bedrails?: A Little Help needed moving to and from a bed to a chair (including a wheelchair)?: A Little Help needed standing up from a chair using your arms (e.g., wheelchair or bedside chair)?: A Little Help needed to walk in hospital room?: A Little Help needed climbing 3-5 steps with a railing? : A Little 6 Click Score: 18    End of Session Equipment Utilized During Treatment: Gait belt Activity Tolerance: Patient tolerated treatment well Patient left: in chair;with call bell/phone within reach;with nursing/sitter in room Nurse Communication: Mobility status PT Visit Diagnosis: Unsteadiness on feet (  R26.81);Pain Pain - Right/Left: Right Pain - part of body: Knee     Time: 1140-1200 PT Time Calculation (min) (ACUTE ONLY): 20 min  Charges:    $$Therapeutic Exercise: 8-22 mins $Self Care/Home Management: 8-22 PT General Charges $$ ACUTE PT VISIT: 1 Visit                     .Blanchard Kelch PT Acute Rehabilitation Services Office [phone] Weekend pager-[phone]     Rada Hay 06/19/2023, 1:50 PM

## 2023-06-19 NOTE — Care Management Obs Status (Signed)
MEDICARE OBSERVATION STATUS NOTIFICATION   Patient Details  Name: Rebecca Brooks MRN: [id number] Date of Birth: [birth year]   Medicare Observation Status Notification Given:  Yes    Amada Jupiter, LCSW 06/19/2023, 1:05 PM

## 2023-06-19 NOTE — TOC Transition Note (Signed)
Transition of Care [facility]) - CM/SW Discharge Note   Patient Details  Name: Rebecca Brooks MRN: [id number] Date of Birth: 21-Apr-1951  Transition of Care [facility]) CM/SW Contact:  Amada Jupiter, LCSW Phone Number: 06/19/2023, 10:07 AM   Clinical Narrative:     Met with pt who confirms she has needed DME in the home.  OPPT already arranged with [facility].  No TOC needs.    Final next level of care: OP Rehab Barriers to Discharge: No Barriers Identified   Patient Goals and CMS Choice      Discharge Placement                         Discharge Plan and Services Additional resources added to the After Visit Summary for                  DME Arranged: N/A DME Agency: NA                  Social Determinants of Health (SDOH) Interventions SDOH Screenings   Food Insecurity: No Food Insecurity (06/18/2023)  Housing: Patient Declined (06/18/2023)  Transportation Needs: No Transportation Needs (06/18/2023)  Utilities: Not At Risk (06/18/2023)  Alcohol Screen: Low Risk  (09/13/2022)  Depression (PHQ2-9): Low Risk  (10/31/2022)  Financial Resource Strain: Low Risk  (09/13/2022)  Physical Activity: Inactive (09/13/2022)  Social Connections: Unknown (12/21/2022)   Received from [facility]  Stress: Stress Concern Present (12/27/2022)   Received from [facility], [facility]  Tobacco Use: Medium Risk (06/18/2023)     Readmission Risk Interventions     No data to display

## 2023-06-20 DIAGNOSIS — M25561 Pain in right knee: Secondary | ICD-10-CM | POA: Diagnosis not present

## 2023-06-20 DIAGNOSIS — M25661 Stiffness of right knee, not elsewhere classified: Secondary | ICD-10-CM | POA: Diagnosis not present

## 2023-06-22 ENCOUNTER — Other Ambulatory Visit: Payer: Self-pay | Admitting: Family Medicine

## 2023-06-25 DIAGNOSIS — M25561 Pain in right knee: Secondary | ICD-10-CM | POA: Diagnosis not present

## 2023-06-25 DIAGNOSIS — M25661 Stiffness of right knee, not elsewhere classified: Secondary | ICD-10-CM | POA: Diagnosis not present

## 2023-06-25 NOTE — Discharge Summary (Signed)
[name] MRN: [id number] DOB/AGE: 1950/11/25 72 y.o.  Admit date: 06/18/2023 Discharge date: 06/19/2023  Admission Diagnoses:  Principal Problem:   OA (osteoarthritis) of knee Active Problems:   Primary osteoarthritis of right knee   Discharge Diagnoses:  Same  Past Medical History:  Diagnosis Date   Abnormal nuclear stress test    False positive. Cath 08/23/15 showed Angiographically normal coronary arteries   Anemia    Ankle fracture    Arthritis    Asthma    "asthmatic bronchitis"   Atrial fibrillation (HCC)    Back pain    Chicken pox as a child   Edema of both lower extremities    Essential hypertension 07/17/2011   ACEI d/c 06/2011 for refractory cough> resolved    GERD (gastroesophageal reflux disease)    Hyperlipidemia    Hypertension    Hypothyroidism    Insomnia 11/01/2014   Insulin resistance    pt reports she was told this in the past, does not take any medication for this   Joint pain    Low back pain radiating to right leg    intermittent and occasional numbness of skin over right hip in certain positions   Measles as a child   Mumps as a child   Obesity    Osteoarthritis    Palpitation    Pneumonia    this October 2013 from which she has said inhailer   PONV (postoperative nausea and vomiting)    "violent vomiting after surgery"   Recurrent epistaxis 02/20/2012   RLS (restless legs syndrome) 11/01/2014   Skin cancer    Sleep apnea    cpap   SOB (shortness of breath) on exertion    Spinal stenosis    Vitamin B 12 deficiency    Vitamin D deficiency     Surgeries: Procedure(s): TOTAL KNEE ARTHROPLASTY on 06/18/2023   Consultants:   Discharged Condition: Improved  Hospital Course: Rebecca Brooks is an 72 y.o. female who was admitted 06/18/2023 for operative treatment ofOA (osteoarthritis) of knee. Patient has severe unremitting pain that affects sleep, daily activities, and work/hobbies. After pre-op clearance  the patient was taken to the operating room on 06/18/2023 and underwent  Procedure(s): TOTAL KNEE ARTHROPLASTY.    Patient was given perioperative antibiotics:  Anti-infectives (From admission, onward)    Start     Dose/Rate Route Frequency Ordered Stop   06/18/23 1700  ceFAZolin (ANCEF) IVPB 2g/100 mL premix        2 g 200 mL/hr over 30 Minutes Intravenous Every 6 hours 06/18/23 1351 06/18/23 2304   06/18/23 0830  ceFAZolin (ANCEF) IVPB 2g/100 mL premix        2 g 200 mL/hr over 30 Minutes Intravenous On call to O.R. 06/18/23 0818 06/18/23 1038        Patient was given sequential compression devices, early ambulation, and chemoprophylaxis to prevent DVT.  Patient benefited maximally from hospital stay and there were no complications.    Recent vital signs: No data found.   Recent laboratory studies: No results for input(s): "WBC", "HGB", "HCT", "PLT", "NA", "K", "CL", "CO2", "BUN", "CREATININE", "GLUCOSE", "INR", "CALCIUM" in the last 72 hours.  Invalid input(s): "PT", "2"   Discharge Medications:   Allergies as of 06/19/2023       Reactions   Bee Venom Anaphylaxis   Accupril [quinapril Hcl] Cough   Quinapril-hydrochlorothiazide Cough   Simvastatin Other (See Comments)   myalgias   Rosuvastatin Other (See Comments)  Leg aches.        Medication List     TAKE these medications    acetaminophen 650 MG CR tablet Commonly known as: TYLENOL Take 1,300 mg by mouth 2 (two) times daily.   albuterol 108 (90 Base) MCG/ACT inhaler Commonly known as: VENTOLIN HFA Inhale 1-2 puffs into the lungs every 6 (six) hours as needed for wheezing or shortness of breath. What changed: how much to take   apixaban 5 MG Tabs tablet Commonly known as: ELIQUIS Take 1 tablet (5 mg total) by mouth 2 (two) times daily.   benzonatate 200 MG capsule Commonly known as: TESSALON Take 1 capsule (200 mg total) by mouth 3 (three) times daily as needed for cough. What changed: when to take  this   cetirizine 10 MG tablet Commonly known as: ZYRTEC Take 1 tablet (10 mg total) by mouth daily.   diltiazem 240 MG 24 hr capsule Commonly known as: CARDIZEM CD Take 1 capsule (240 mg total) by mouth daily.   famotidine 20 MG tablet Commonly known as: PEPCID TAKE 1 TABLET(20 MG) BY MOUTH AT BEDTIME   fluticasone 50 MCG/ACT nasal spray Commonly known as: FLONASE SHAKE LIQUID AND USE 2 SPRAYS IN EACH NOSTRIL AT BEDTIME What changed: See the new instructions.   furosemide 20 MG tablet Commonly known as: LASIX TAKE 1 TABLET BY MOUTH DAILY AS NEEDED FOR FLUID OR EDEMA( WEIGHT GAIN MORE THAN 3 POUNDS IN 24 HOURS) What changed: See the new instructions.   methocarbamol 500 MG tablet Commonly known as: ROBAXIN Take 1 tablet (500 mg total) by mouth every 6 (six) hours as needed for muscle spasms. What changed: when to take this   metoprolol tartrate 50 MG tablet Commonly known as: LOPRESSOR TAKE 1 TABLET(50 MG) BY MOUTH TWICE DAILY   ondansetron 4 MG tablet Commonly known as: ZOFRAN Take 1 tablet (4 mg total) by mouth every 6 (six) hours as needed for nausea. What changed: when to take this   oxyCODONE 5 MG immediate release tablet Commonly known as: Oxy IR/ROXICODONE Take 1-2 tablets (5-10 mg total) by mouth every 6 (six) hours as needed for severe pain (pain score 7-10).   pantoprazole 40 MG tablet Commonly known as: PROTONIX TAKE 1 TABLET(40 MG) BY MOUTH DAILY   polyethylene glycol 17 g packet Commonly known as: MIRALAX / GLYCOLAX Take 17 g by mouth daily as needed for moderate constipation.   rOPINIRole 2 MG tablet Commonly known as: REQUIP Take 1 tablet (2 mg total) by mouth at bedtime.   traMADol 50 MG tablet Commonly known as: ULTRAM Take 1-2 tablets (50-100 mg total) by mouth every 6 (six) hours as needed for moderate pain (pain score 4-6). What changed:  how much to take when to take this reasons to take this   vitamin D3 25 MCG tablet Commonly known  as: CHOLECALCIFEROL Take 1 tablet (1,000 Units total) by mouth daily. What changed: when to take this               Discharge Care Instructions  (From admission, onward)           Start     Ordered   06/19/23 0000  Weight bearing as tolerated        06/19/23 0847   06/19/23 0000  Change dressing       Comments: You may remove the bulky bandage (ACE wrap and gauze) two days after surgery. You will have an adhesive waterproof bandage underneath. Leave this in  place until your first follow-up appointment.   06/19/23 0847            Diagnostic Studies: No results found.  Disposition: Discharge disposition: 01-Home or Self Care       Discharge Instructions     Call MD / Call 911   Complete by: As directed    If you experience chest pain or shortness of breath, CALL 911 and be transported to the hospital emergency room.  If you develope a fever above 101 F, pus (white drainage) or increased drainage or redness at the wound, or calf pain, call your surgeon's office.   Change dressing   Complete by: As directed    You may remove the bulky bandage (ACE wrap and gauze) two days after surgery. You will have an adhesive waterproof bandage underneath. Leave this in place until your first follow-up appointment.   Constipation Prevention   Complete by: As directed    Drink plenty of fluids.  Prune juice may be helpful.  You may use a stool softener, such as Colace (over the counter) 100 mg twice a day.  Use MiraLax (over the counter) for constipation as needed.   Diet - low sodium heart healthy   Complete by: As directed    Do not put a pillow under the knee. Place it under the heel.   Complete by: As directed    Driving restrictions   Complete by: As directed    No driving for two weeks   Post-operative opioid taper instructions:   Complete by: As directed    POST-OPERATIVE OPIOID TAPER INSTRUCTIONS: It is important to wean off of your opioid medication as soon as  possible. If you do not need pain medication after your surgery it is ok to stop day one. Opioids include: Codeine, Hydrocodone(Norco, Vicodin), Oxycodone(Percocet, oxycontin) and hydromorphone amongst others.  Long term and even short term use of opiods can cause: Increased pain response Dependence Constipation Depression Respiratory depression And more.  Withdrawal symptoms can include Flu like symptoms Nausea, vomiting And more Techniques to manage these symptoms Hydrate well Eat regular healthy meals Stay active Use relaxation techniques(deep breathing, meditating, yoga) Do Not substitute Alcohol to help with tapering If you have been on opioids for less than two weeks and do not have pain than it is ok to stop all together.  Plan to wean off of opioids This plan should start within one week post op of your joint replacement. Maintain the same interval or time between taking each dose and first decrease the dose.  Cut the total daily intake of opioids by one tablet each day Next start to increase the time between doses. The last dose that should be eliminated is the evening dose.      TED hose   Complete by: As directed    Use stockings (TED hose) for three weeks on both leg(s).  You may remove them at night for sleeping.   Weight bearing as tolerated   Complete by: As directed         Follow-up Information     Aluisio, Homero Fellers, MD. Schedule an appointment as soon as possible for a visit in 2 day(s).   Specialty: Orthopedic Surgery Contact information: [address] [place] [address] [phone]                  Signed: Arther Abbott 06/25/2023, 10:53 AM

## 2023-06-27 ENCOUNTER — Other Ambulatory Visit: Payer: Self-pay | Admitting: Family Medicine

## 2023-06-27 ENCOUNTER — Other Ambulatory Visit: Payer: Self-pay | Admitting: Cardiovascular Disease

## 2023-06-27 DIAGNOSIS — M25661 Stiffness of right knee, not elsewhere classified: Secondary | ICD-10-CM | POA: Diagnosis not present

## 2023-06-27 DIAGNOSIS — M25561 Pain in right knee: Secondary | ICD-10-CM | POA: Diagnosis not present

## 2023-06-27 DIAGNOSIS — G2581 Restless legs syndrome: Secondary | ICD-10-CM

## 2023-06-29 DIAGNOSIS — M25661 Stiffness of right knee, not elsewhere classified: Secondary | ICD-10-CM | POA: Diagnosis not present

## 2023-06-29 DIAGNOSIS — M25561 Pain in right knee: Secondary | ICD-10-CM | POA: Diagnosis not present

## 2023-07-02 ENCOUNTER — Encounter: Payer: Self-pay | Admitting: Family Medicine

## 2023-07-02 DIAGNOSIS — M25661 Stiffness of right knee, not elsewhere classified: Secondary | ICD-10-CM | POA: Diagnosis not present

## 2023-07-04 DIAGNOSIS — M25661 Stiffness of right knee, not elsewhere classified: Secondary | ICD-10-CM | POA: Diagnosis not present

## 2023-07-04 DIAGNOSIS — M25561 Pain in right knee: Secondary | ICD-10-CM | POA: Diagnosis not present

## 2023-07-09 DIAGNOSIS — M25661 Stiffness of right knee, not elsewhere classified: Secondary | ICD-10-CM | POA: Diagnosis not present

## 2023-07-09 DIAGNOSIS — M25561 Pain in right knee: Secondary | ICD-10-CM | POA: Diagnosis not present

## 2023-07-11 ENCOUNTER — Other Ambulatory Visit: Payer: Self-pay | Admitting: Family Medicine

## 2023-07-11 ENCOUNTER — Encounter: Payer: Self-pay | Admitting: Family Medicine

## 2023-07-11 DIAGNOSIS — M25661 Stiffness of right knee, not elsewhere classified: Secondary | ICD-10-CM | POA: Diagnosis not present

## 2023-07-11 DIAGNOSIS — M25561 Pain in right knee: Secondary | ICD-10-CM | POA: Diagnosis not present

## 2023-07-11 MED ORDER — TRAMADOL HCL 50 MG PO TABS: 50.0000 mg | ORAL_TABLET | Freq: Four times a day (QID) | ORAL | 0 refills | Status: DC | PRN

## 2023-07-11 NOTE — Telephone Encounter (Signed)
Requesting: Tramadol 50 mg Tablet Contract: 11/08/2022 UDS: 10/31/2022 Last Visit: 05/01/2023 Next Visit: 12/20/2023 Last Refill: 06/19/2023  Please Advise

## 2023-07-13 DIAGNOSIS — M25661 Stiffness of right knee, not elsewhere classified: Secondary | ICD-10-CM | POA: Diagnosis not present

## 2023-07-13 DIAGNOSIS — M25561 Pain in right knee: Secondary | ICD-10-CM | POA: Diagnosis not present

## 2023-07-16 DIAGNOSIS — M25661 Stiffness of right knee, not elsewhere classified: Secondary | ICD-10-CM | POA: Diagnosis not present

## 2023-07-16 DIAGNOSIS — M25561 Pain in right knee: Secondary | ICD-10-CM | POA: Diagnosis not present

## 2023-07-23 DIAGNOSIS — M25661 Stiffness of right knee, not elsewhere classified: Secondary | ICD-10-CM | POA: Diagnosis not present

## 2023-07-23 DIAGNOSIS — M25561 Pain in right knee: Secondary | ICD-10-CM | POA: Diagnosis not present

## 2023-07-24 DIAGNOSIS — Z5189 Encounter for other specified aftercare: Secondary | ICD-10-CM | POA: Diagnosis not present

## 2023-07-30 DIAGNOSIS — M25661 Stiffness of right knee, not elsewhere classified: Secondary | ICD-10-CM | POA: Diagnosis not present

## 2023-07-30 DIAGNOSIS — M25561 Pain in right knee: Secondary | ICD-10-CM | POA: Diagnosis not present

## 2023-08-01 ENCOUNTER — Ambulatory Visit (HOSPITAL_BASED_OUTPATIENT_CLINIC_OR_DEPARTMENT_OTHER): Payer: Medicare Other | Admitting: Pulmonary Disease

## 2023-08-01 ENCOUNTER — Encounter (HOSPITAL_BASED_OUTPATIENT_CLINIC_OR_DEPARTMENT_OTHER): Payer: Self-pay | Admitting: Pulmonary Disease

## 2023-08-01 VITALS — BP 128/68 | HR 63 | Resp 16 | Ht 60.75 in | Wt 202.0 lb

## 2023-08-01 DIAGNOSIS — I480 Paroxysmal atrial fibrillation: Secondary | ICD-10-CM

## 2023-08-01 DIAGNOSIS — M25661 Stiffness of right knee, not elsewhere classified: Secondary | ICD-10-CM | POA: Diagnosis not present

## 2023-08-01 DIAGNOSIS — M25561 Pain in right knee: Secondary | ICD-10-CM | POA: Diagnosis not present

## 2023-08-01 DIAGNOSIS — G4733 Obstructive sleep apnea (adult) (pediatric): Secondary | ICD-10-CM

## 2023-08-01 NOTE — Addendum Note (Signed)
 Addended by: Oretha Milch on: 08/01/2023 09:19 AM   Modules accepted: Orders

## 2023-08-01 NOTE — Progress Notes (Signed)
 Subjective:    [name], female    DOB: 1951/06/18, 73 y.o.   MRN: [id number]  HPI  73 yo remote smoker for FU of OSA . She has been seen in the past for chronic cough and community-acquired pneumonia   PMH - gastric sleeve surgery 12/2017 at [facility] Atrial fibrillation  Discussed the use of AI scribe software for clinical note transcription with the patient, who gave verbal consent to proceed.  History of Present Illness   The patient, with a history of sleep apnea, has been on CPAP therapy. They express dissatisfaction with the machine, citing discomfort and inconvenience, particularly with the mask and tubing. The patient reports a significant weight loss, having gone from 270 pounds to their current weight of 188 pounds. This weight loss was achieved through the [facility] program and has resulted in improved mobility following two knee surgeries. The patient expresses a desire to discontinue the CPAP machine, hoping that their weight loss may have alleviated their sleep apnea. However, they acknowledge that they are sleeping better and getting better rest with the machine. The patient also reports a history of atrial fibrillation, for which they are on Eliquis  and diltiazem .      CPAP download was reviewed which shows excellent control of events on auto settings 5 to 12 cm with average pressure 11.5 cm.  She has a large air leak since she changed her mask in November, average usage is more than 6 hours per night without a single missed night. Residual events are slightly higher due to the leak.  Overall CPAP is only helped improve her daytime somnolence and fatigue  Significant tests/ events reviewed   NPSG 02/2018 >> AHI 27/h, wt 271 lbs   PFT's 09/2011 FEV1  2.04 (95%) ratio 78 and ERV 62 with DLCO 46%  Review of Systems neg for any significant sore throat, dysphagia, itching, sneezing, nasal congestion or excess/ purulent secretions, fever, chills, sweats,  unintended wt loss, pleuritic or exertional cp, hempoptysis, orthopnea pnd or change in chronic leg swelling. Also denies presyncope, palpitations, heartburn, abdominal pain, nausea, vomiting, diarrhea or change in bowel or urinary habits, dysuria,hematuria, rash, arthralgias, visual complaints, headache, numbness weakness or ataxia.     Objective:   Physical Exam  Gen. Pleasant, obese, in no distress ENT - no lesions, no post nasal drip Neck: No JVD, no thyromegaly, no carotid bruits Lungs: no use of accessory muscles, no dullness to percussion, decreased without rales or rhonchi  Cardiovascular: Rhythm regular, heart sounds  normal, no murmurs or gallops, no peripheral edema Musculoskeletal: No deformities, no cyanosis or clubbing , no tremors       Assessment & Plan:    Assessment and Plan    Obstructive Sleep Apnea (OSA) Follow-up for OSA on CPAP therapy. Reports frustration with CPAP, including mask fit issues and dryness. Despite weight loss, still experiences up to 10 residual events per hour. CPAP usage shows less than 5 events per hour, indicating effective treatment. Discussed potential for mild residual sleep apnea and its impact on cardiac risk, particularly given history of AFib. Explained that CPAP helps reduce cardiac risk and improve sleep quality. Prefers full mask due to mouth breathing. - Order home sleep study - Advise to order new CPAP mask and tubing - Continue CPAP therapy until sleep study results are available  Atrial Fibrillation (AFib) AFib with recent episodes in October and one month ago. Managed with Eliquis  and diltiazem . Emphasized importance of CPAP in reducing  cardiac risk associated with AFib. - Continue Eliquis  - Continue diltiazem   General Health Maintenance Weight loss improving overall health and reducing cardiac risk. Discussed relationship between weight loss, sleep apnea, and cardiac health. Explained that weight loss can decrease severity of  sleep apnea and improve cardiovascular health. - Encourage continued weight management and healthy lifestyle  Follow-up - Schedule follow-up appointment after home sleep study results are available.

## 2023-08-01 NOTE — Addendum Note (Signed)
 Addended by: Jama Flavors on: 08/01/2023 09:22 AM   Modules accepted: Orders

## 2023-08-01 NOTE — Patient Instructions (Addendum)
 Continue on CPAP OK to order new mask  X home sleep test to reassess  Congratulations on weight loss!!

## 2023-08-03 DIAGNOSIS — M25561 Pain in right knee: Secondary | ICD-10-CM | POA: Diagnosis not present

## 2023-08-03 DIAGNOSIS — M25661 Stiffness of right knee, not elsewhere classified: Secondary | ICD-10-CM | POA: Diagnosis not present

## 2023-08-04 ENCOUNTER — Encounter: Payer: Self-pay | Admitting: Family Medicine

## 2023-08-06 ENCOUNTER — Other Ambulatory Visit: Payer: Self-pay | Admitting: Family Medicine

## 2023-08-06 ENCOUNTER — Other Ambulatory Visit: Payer: Self-pay | Admitting: Family

## 2023-08-06 ENCOUNTER — Other Ambulatory Visit: Payer: Self-pay | Admitting: Emergency Medicine

## 2023-08-06 DIAGNOSIS — G2581 Restless legs syndrome: Secondary | ICD-10-CM

## 2023-08-06 DIAGNOSIS — M25661 Stiffness of right knee, not elsewhere classified: Secondary | ICD-10-CM | POA: Diagnosis not present

## 2023-08-06 DIAGNOSIS — M25561 Pain in right knee: Secondary | ICD-10-CM | POA: Diagnosis not present

## 2023-08-06 MED ORDER — TRAMADOL HCL 50 MG PO TABS: 50.0000 mg | ORAL_TABLET | Freq: Four times a day (QID) | ORAL | 0 refills | Status: DC | PRN

## 2023-08-06 MED ORDER — ROPINIROLE HCL 2 MG PO TABS: 2.0000 mg | ORAL_TABLET | Freq: Every day | ORAL | 5 refills | Status: DC

## 2023-08-06 NOTE — Telephone Encounter (Signed)
 Copied from CRM [id number]. Topic: Clinical - Medication Refill >> Aug 06, 2023 10:37 AM Antonio DEL wrote: Most Recent Primary Care Visit:  Provider: DOMENICA BLACKBIRD A  Department: [facility]  Visit Type: OFFICE VISIT  Date: 05/01/2023  Medication: traMADol  (ULTRAM ) 50 MG tablet rOPINIRole  (REQUIP ) 2 MG tablet  Has the patient contacted their pharmacy?  (Agent: If no, request that the patient contact the pharmacy for the refill. If patient does not wish to contact the pharmacy document the reason why and proceed with request.) (Agent: If yes, when and what did the pharmacy advise?)  Is this the correct pharmacy for this prescription?  If no, delete pharmacy and type the correct one.  This is the patient's preferred pharmacy:  [facility] #[id number] [place], [place] - [address] AT [facility] OF [address] & [facility] [address] [place] [address] Phone: [phone] Fax: [phone]  [facility] #[id number] - [place], [place] - [address] AT [facility] OF [address] & [address] [place] [address] Phone: [phone] Fax: [phone]   Has the prescription been filled recently?   Is the patient out of the medication?   Has the patient been seen for an appointment in the last year OR does the patient have an upcoming appointment?   Can we respond through MyChart?   Agent: Please be advised that Rx refills may take up to 3 business days. We ask that you follow-up with your pharmacy.

## 2023-08-06 NOTE — Telephone Encounter (Signed)
 Requesting: Tramadol 50 mg Tablet Contract:11/07/2025 UDS: 10/31/2022 Last Visit:05/01/2023 Next Visit:12/20/23 Last Refill:07/11/2023  Please Advise

## 2023-08-06 NOTE — Telephone Encounter (Signed)
 Requesting: tramadol 50mg  Contract: Yes 10/31/22 UDS: 10/31/22 Last Visit: 05/01/2023 Next Visit: 12/20/2023 Last Refill: 07/11/23  Please Advise

## 2023-08-06 NOTE — Telephone Encounter (Signed)
 Rx was sent in on 06/27/23 with 5rf for ropinirole.

## 2023-08-07 ENCOUNTER — Other Ambulatory Visit: Payer: Self-pay | Admitting: Family Medicine

## 2023-08-07 ENCOUNTER — Encounter (HOSPITAL_BASED_OUTPATIENT_CLINIC_OR_DEPARTMENT_OTHER): Payer: Self-pay | Admitting: Pulmonary Disease

## 2023-08-13 DIAGNOSIS — M25661 Stiffness of right knee, not elsewhere classified: Secondary | ICD-10-CM | POA: Diagnosis not present

## 2023-08-13 DIAGNOSIS — M25561 Pain in right knee: Secondary | ICD-10-CM | POA: Diagnosis not present

## 2023-08-15 ENCOUNTER — Telehealth: Payer: Self-pay | Admitting: Pulmonary Disease

## 2023-08-15 NOTE — Telephone Encounter (Signed)
PT calling wondering status of CPAP order from 1/8. Also, HST results. Please call to update PT. Her # is [phone]

## 2023-08-16 NOTE — Telephone Encounter (Signed)
Message from [facility] regarding CPAP  [place], [place], [address]; [place], [place]; [place], [place]; [place], [place], [place]; 1 other The patient was already [place] for her supplies in our system and can order them through the call center. After speaking with her it seems her issue is something having to do with needing to be re-fitted. I will have our tech reach out.

## 2023-08-16 NOTE — Telephone Encounter (Signed)
Can you help me find this pt's HST in snap?

## 2023-08-17 DIAGNOSIS — M25661 Stiffness of right knee, not elsewhere classified: Secondary | ICD-10-CM | POA: Diagnosis not present

## 2023-08-17 DIAGNOSIS — M25561 Pain in right knee: Secondary | ICD-10-CM | POA: Diagnosis not present

## 2023-08-20 DIAGNOSIS — G4733 Obstructive sleep apnea (adult) (pediatric): Secondary | ICD-10-CM | POA: Diagnosis not present

## 2023-08-20 DIAGNOSIS — M25661 Stiffness of right knee, not elsewhere classified: Secondary | ICD-10-CM | POA: Diagnosis not present

## 2023-08-20 DIAGNOSIS — M25561 Pain in right knee: Secondary | ICD-10-CM | POA: Diagnosis not present

## 2023-08-21 ENCOUNTER — Telehealth: Payer: Self-pay | Admitting: Family Medicine

## 2023-08-21 NOTE — Telephone Encounter (Signed)
Copied from CRM [id number]. Topic: Medicare AWV >> Aug 21, 2023 11:17 AM Payton Doughty wrote: Reason for CRM: Called LVM 08/21/2023 to schedule AWV. Please schedule Virtual or Telehealth visits ONLY.   Verlee Rossetti; Care Guide Ambulatory Clinical Support [place] [facility] Direct Dial: [phone]

## 2023-08-27 DIAGNOSIS — M25661 Stiffness of right knee, not elsewhere classified: Secondary | ICD-10-CM | POA: Diagnosis not present

## 2023-08-27 DIAGNOSIS — M25561 Pain in right knee: Secondary | ICD-10-CM | POA: Diagnosis not present

## 2023-08-30 NOTE — Telephone Encounter (Signed)
 NFN

## 2023-09-03 DIAGNOSIS — M25661 Stiffness of right knee, not elsewhere classified: Secondary | ICD-10-CM | POA: Diagnosis not present

## 2023-09-03 DIAGNOSIS — M25561 Pain in right knee: Secondary | ICD-10-CM | POA: Diagnosis not present

## 2023-09-05 NOTE — Progress Notes (Signed)
Primary Care Physician: Bradd Canary, MD Primary Cardiologist: Reatha Harps, MD Electrophysiologist: None  Referring Physician: Dr Haskell Riling Selam Pietsch is a 73 y.o. female with a history of HLD, HTN, OSA, hypothyroidism, atrial fibrillation who presents for follow up in the [facility]. Patient was seen in the ED on 05/03/2022 with new onset of atrial fibrillation with RVR. She was started on Cardizem drip and there was rate controlled. She was eventually discharged on Eliquis 5 mg twice a day and long-acting Cardizem 240 mg daily with plan to pursue outpatient cardioversion in 3 weeks with uninterrupted anticoagulation therapy. Patient was returned back to the hospital on 05/05/2022 with a recurrent A-fib, cardiology service was called again to admit the patient, however she was already back in sinus rhythm, therefore was discharged from the emergency room. She remained in SR until 10/16 when she had tachypalpitations in the middle of the night. Apple watch strips reviewed. She reports that she did miss a dose of diltiazem the day previous. She also did not use her CPAP for several days. She converted to SR with vagal maneuvers.   Patient returns for follow up for atrial fibrillation. She reports that she has had 4-5 episodes of afib since her last visit. The longest episode lasted about 5 hours. There are no specific triggers that she could identify. Fortunately, her heart rates have been controlled during the episodes. Apple Watch strips personally reviewed today.   Today, he denies symptoms of chest pain, shortness of breath, orthopnea, PND, lower extremity edema, dizziness, presyncope, syncope, snoring, daytime somnolence, bleeding, or neurologic sequela. The patient is tolerating medications without difficulties and is otherwise without complaint today.    Atrial Fibrillation Risk Factors:  she does have symptoms or diagnosis of sleep apnea. she  does not have a history of rheumatic fever. she does not have a history of alcohol use. The patient does not have a history of early familial atrial fibrillation or other arrhythmias.  Atrial Fibrillation Management history:  Previous antiarrhythmic drugs: none Previous cardioversions: none Previous ablations: none Anticoagulation history: Eliquis  ROS- All systems are reviewed and negative except as per the HPI above.  Past Medical History:  Diagnosis Date   Abnormal nuclear stress test    False positive. Cath 08/23/15 showed Angiographically normal coronary arteries   Anemia    Ankle fracture    Arthritis    Asthma    "asthmatic bronchitis"   Atrial fibrillation (HCC)    Back pain    Chicken pox as a child   Edema of both lower extremities    Essential hypertension 07/17/2011   ACEI d/c 06/2011 for refractory cough> resolved    GERD (gastroesophageal reflux disease)    Hyperlipidemia    Hypertension    Hypothyroidism    Insomnia 11/01/2014   Insulin resistance    pt reports she was told this in the past, does not take any medication for this   Joint pain    Low back pain radiating to right leg    intermittent and occasional numbness of skin over right hip in certain positions   Measles as a child   Mumps as a child   Obesity    Osteoarthritis    Palpitation    Pneumonia    this October 2013 from which she has said inhailer   PONV (postoperative nausea and vomiting)    "violent vomiting after surgery"   Recurrent epistaxis 02/20/2012  RLS (restless legs syndrome) 11/01/2014   Skin cancer    Sleep apnea    cpap   SOB (shortness of breath) on exertion    Spinal stenosis    Vitamin B 12 deficiency    Vitamin D deficiency     Current Outpatient Medications  Medication Sig Dispense Refill   acetaminophen (TYLENOL) 650 MG CR tablet Take 1,300 mg by mouth 2 (two) times daily.     albuterol (VENTOLIN HFA) 108 (90 Base) MCG/ACT inhaler Inhale 1-2 puffs into the  lungs every 6 (six) hours as needed for wheezing or shortness of breath. (Patient taking differently: Inhale 2 puffs into the lungs every 6 (six) hours as needed for wheezing or shortness of breath.) 1 each 0   apixaban (ELIQUIS) 5 MG TABS tablet Take 1 tablet (5 mg total) by mouth 2 (two) times daily. 28 tablet    benzonatate (TESSALON) 200 MG capsule Take 1 capsule (200 mg total) by mouth 3 (three) times daily as needed for cough. (Patient taking differently: Take 200 mg by mouth as needed for cough.) 30 capsule 0   cetirizine (ZYRTEC) 10 MG tablet Take 1 tablet (10 mg total) by mouth daily. 90 tablet 1   diltiazem (CARDIZEM CD) 240 MG 24 hr capsule Take 1 capsule (240 mg total) by mouth daily. 90 capsule 3   famotidine (PEPCID) 20 MG tablet TAKE 1 TABLET(20 MG) BY MOUTH AT BEDTIME 90 tablet 1   fluticasone (FLONASE) 50 MCG/ACT nasal spray SHAKE LIQUID AND USE 2 SPRAYS IN EACH NOSTRIL AT BEDTIME 16 g 5   furosemide (LASIX) 20 MG tablet TAKE 1 TABLET BY MOUTH DAILY AS NEEDED FOR FLUID OR EDEMA( WEIGHT GAIN MORE THAN 3 POUNDS IN 24 HOURS) (Patient taking differently: Take 20 mg by mouth daily.) 180 tablet 1   levothyroxine (SYNTHROID) 100 MCG tablet TAKE 1 TABLET(100 MCG) BY MOUTH DAILY BEFORE BREAKFAST 90 tablet 1   methocarbamol (ROBAXIN) 500 MG tablet Take 1 tablet (500 mg total) by mouth every 6 (six) hours as needed for muscle spasms. 40 tablet 0   metoprolol tartrate (LOPRESSOR) 50 MG tablet TAKE 1 TABLET(50 MG) BY MOUTH TWICE DAILY 180 tablet 1   ondansetron (ZOFRAN) 4 MG tablet Take 1 tablet (4 mg total) by mouth every 6 (six) hours as needed for nausea. (Patient taking differently: Take 4 mg by mouth as needed for nausea.) 20 tablet 0   pantoprazole (PROTONIX) 40 MG tablet TAKE 1 TABLET(40 MG) BY MOUTH DAILY 90 tablet 3   polyethylene glycol (MIRALAX / GLYCOLAX) 17 g packet Take 17 g by mouth daily as needed for moderate constipation.     rOPINIRole (REQUIP) 2 MG tablet Take 1 tablet (2 mg  total) by mouth daily. 30 tablet 5   traMADol (ULTRAM) 50 MG tablet Take 1-2 tablets (50-100 mg total) by mouth every 6 (six) hours as needed for moderate pain (pain score 4-6). 90 tablet 0   Vitamin D3 (VITAMIN D) 25 MCG tablet Take 1 tablet (1,000 Units total) by mouth daily. 60 tablet 1   No current facility-administered medications for this encounter.    Physical Exam: BP (!) 140/70   Pulse 64   Ht 5' 0.75" (1.543 m)   Wt 88.9 kg   BMI 37.34 kg/m   GEN: Well nourished, well developed in no acute distress CARDIAC: Regular rate and rhythm, no murmurs, rubs, gallops RESPIRATORY:  Clear to auscultation without rales, wheezing or rhonchi  ABDOMEN: Soft, non-tender, non-distended EXTREMITIES:  No  edema; No deformity    Wt Readings from Last 3 Encounters:  09/06/23 88.9 kg  08/01/23 91.6 kg  06/18/23 85.7 kg     EKG today demonstrates  SR Vent. rate 64 BPM PR interval 178 ms QRS duration 96 ms QT/QTcB 404/416 ms   Echo 04/10/22 demonstrated   1. Left ventricular ejection fraction, by estimation, is 60 to 65%. The  left ventricle has normal function. The left ventricle has no regional  wall motion abnormalities. Left ventricular diastolic parameters were  normal.   2. Right ventricular systolic function is normal. The right ventricular  size is normal. There is normal pulmonary artery systolic pressure.   3. Left atrial size was mildly dilated.   4. The mitral valve is normal in structure. No evidence of mitral valve  regurgitation. No evidence of mitral stenosis.   5. The aortic valve is tricuspid. There is mild calcification of the  aortic valve. Aortic valve regurgitation is not visualized. Aortic valve  sclerosis is present, with no evidence of aortic valve stenosis.   6. The inferior vena cava is normal in size with greater than 50%  respiratory variability, suggesting right atrial pressure of 3 mmHg.    CHA2DS2-VASc Score = 3  The patient's score is based  upon: CHF History: 0 HTN History: 1 Diabetes History: 0 Stroke History: 0 Vascular Disease History: 0 Age Score: 1 Gender Score: 1       ASSESSMENT AND PLAN: Paroxysmal Atrial Fibrillation (ICD10:  I48.0) The patient's CHA2DS2-VASc score is 3, indicating a 3.2% annual risk of stroke.   Patient has had multiple afib episodes over the past 3 months. We discussed rhythm control options including flecainide, Multaq, and ablation. After discussing the risks and benefits, will start flecainide 50 mg BID. Long term, she is interested in ablation. Will refer to EP to establish care (her husband sees Dr Lalla Brothers). Continue Eliquis 5 mg BID Continue diltiazem 240 mg daily Continue Lopressor 50 mg BID Apple Watch for home monitoring  Secondary Hypercoagulable State (ICD10:  D68.69) The patient is at significant risk for stroke/thromboembolism based upon her CHA2DS2-VASc Score of 3.  Continue Apixaban (Eliquis). No bleeding issues.   HTN Stable on current regimen  OSA  Encouraged nightly CPAP  Obesity Body mass index is 37.34 kg/m.  Encouraged lifestyle modification    Follow up in the AF clinic next week for ECG.       Jorja Loa PA-C Afib Clinic [facility] [address] [place], [place] [address] [phone]

## 2023-09-06 ENCOUNTER — Ambulatory Visit (HOSPITAL_COMMUNITY)
Admission: RE | Admit: 2023-09-06 | Discharge: 2023-09-06 | Disposition: A | Discharge status: Home / Self Care | Payer: Medicare Other | Source: Ambulatory Visit | Attending: Physician Assistant | Admitting: Physician Assistant

## 2023-09-06 VITALS — BP 140/70 | HR 64 | Ht 60.75 in | Wt 196.0 lb

## 2023-09-06 DIAGNOSIS — D6869 Other thrombophilia: Secondary | ICD-10-CM | POA: Insufficient documentation

## 2023-09-06 DIAGNOSIS — E669 Obesity, unspecified: Secondary | ICD-10-CM | POA: Insufficient documentation

## 2023-09-06 DIAGNOSIS — I480 Paroxysmal atrial fibrillation: Secondary | ICD-10-CM | POA: Insufficient documentation

## 2023-09-06 DIAGNOSIS — Z6837 Body mass index (BMI) 37.0-37.9, adult: Secondary | ICD-10-CM | POA: Diagnosis not present

## 2023-09-06 DIAGNOSIS — E039 Hypothyroidism, unspecified: Secondary | ICD-10-CM | POA: Insufficient documentation

## 2023-09-06 DIAGNOSIS — Z7901 Long term (current) use of anticoagulants: Secondary | ICD-10-CM | POA: Diagnosis not present

## 2023-09-06 DIAGNOSIS — I10 Essential (primary) hypertension: Secondary | ICD-10-CM | POA: Insufficient documentation

## 2023-09-06 DIAGNOSIS — G4733 Obstructive sleep apnea (adult) (pediatric): Secondary | ICD-10-CM | POA: Diagnosis not present

## 2023-09-06 DIAGNOSIS — E785 Hyperlipidemia, unspecified: Secondary | ICD-10-CM | POA: Diagnosis not present

## 2023-09-06 DIAGNOSIS — Z79899 Other long term (current) drug therapy: Secondary | ICD-10-CM | POA: Diagnosis not present

## 2023-09-06 MED ORDER — FLECAINIDE ACETATE 50 MG PO TABS: 50.0000 mg | ORAL_TABLET | Freq: Two times a day (BID) | ORAL | 3 refills | Status: DC

## 2023-09-06 NOTE — Patient Instructions (Signed)
Start Flecainide 50mg  twice a day

## 2023-09-07 DIAGNOSIS — M25661 Stiffness of right knee, not elsewhere classified: Secondary | ICD-10-CM | POA: Diagnosis not present

## 2023-09-07 DIAGNOSIS — M25561 Pain in right knee: Secondary | ICD-10-CM | POA: Diagnosis not present

## 2023-09-09 DIAGNOSIS — G4730 Sleep apnea, unspecified: Secondary | ICD-10-CM | POA: Diagnosis not present

## 2023-09-09 DIAGNOSIS — G4733 Obstructive sleep apnea (adult) (pediatric): Secondary | ICD-10-CM

## 2023-09-10 DIAGNOSIS — M25661 Stiffness of right knee, not elsewhere classified: Secondary | ICD-10-CM | POA: Diagnosis not present

## 2023-09-10 DIAGNOSIS — M25561 Pain in right knee: Secondary | ICD-10-CM | POA: Diagnosis not present

## 2023-09-13 NOTE — Progress Notes (Signed)
Patient returns for ECG after starting flecainide. ECG shows:  SB, LAFB Vent. rate 56 BPM PR interval 196 ms QRS duration 98 ms QT/QTcB 424/409 ms   Patient remains in SR and is tolerating the medication without difficulty. Follow up with Dr Lalla Brothers as scheduled.

## 2023-09-14 ENCOUNTER — Ambulatory Visit (HOSPITAL_COMMUNITY)
Admission: RE | Admit: 2023-09-14 | Discharge: 2023-09-14 | Disposition: A | Discharge status: Home / Self Care | Payer: Medicare Other | Source: Ambulatory Visit | Attending: Physician Assistant | Admitting: Physician Assistant

## 2023-09-14 VITALS — HR 56

## 2023-09-14 DIAGNOSIS — I480 Paroxysmal atrial fibrillation: Secondary | ICD-10-CM | POA: Diagnosis not present

## 2023-09-14 DIAGNOSIS — Z79899 Other long term (current) drug therapy: Secondary | ICD-10-CM | POA: Insufficient documentation

## 2023-09-14 DIAGNOSIS — M25661 Stiffness of right knee, not elsewhere classified: Secondary | ICD-10-CM | POA: Diagnosis not present

## 2023-09-14 DIAGNOSIS — M25561 Pain in right knee: Secondary | ICD-10-CM | POA: Diagnosis not present

## 2023-09-19 ENCOUNTER — Telehealth: Payer: Self-pay | Admitting: Pulmonary Disease

## 2023-09-19 DIAGNOSIS — R069 Unspecified abnormalities of breathing: Secondary | ICD-10-CM | POA: Diagnosis not present

## 2023-09-19 NOTE — Telephone Encounter (Signed)
 HST showed mod  OSA with AHI 17/ hr & low sat 69%  This is improved from her prior study due to weight loss but still of moderate degree and needs treatment Suggest  she stay on CPAP If she is absolutely unable to tolerate CPAP, then we can consider referral to dentist for dental appliance

## 2023-09-20 DIAGNOSIS — M19011 Primary osteoarthritis, right shoulder: Secondary | ICD-10-CM | POA: Diagnosis not present

## 2023-09-20 NOTE — Telephone Encounter (Signed)
 Pt notified she is okay with CPAP

## 2023-09-21 ENCOUNTER — Other Ambulatory Visit: Payer: Self-pay | Admitting: Family Medicine

## 2023-09-21 NOTE — Telephone Encounter (Signed)
 Last Fill: 08/06/23 90 tabs/0 RF  Last OV: 05/01/23 Next OV: 12/20/23  Routing to provider for review/authorization.

## 2023-09-21 NOTE — Telephone Encounter (Signed)
 Copied from CRM [id number]. Topic: Clinical - Medication Refill >> Sep 21, 2023 11:54 AM Hector Shade B wrote: Most Recent Primary Care Visit:  Provider: Danise Edge A  Department: [facility]  Visit Type: OFFICE VISIT  Date: 05/01/2023  Medication: traMADol (ULTRAM) 50 MG tablet  Has the patient contacted their pharmacy? No (Agent: If no, request that the patient contact the pharmacy for the refill. If patient does not wish to contact the pharmacy document the reason why and proceed with request.) (Agent: If yes, when and what did the pharmacy advise?)  Is this the correct pharmacy for this prescription? Yes If no, delete pharmacy and type the correct one.  This is the patient's preferred pharmacy:  [facility] #[id number] [place], [place] - [address] AT [facility] OF [address] & [facility] [address] [place] [address] Phone: [phone] Fax: [phone]  [facility] #[id number] - [place], [place] - [address] AT [facility] OF [facility] [address] & [address] [place] [address] Phone: [phone] Fax: [phone]   Has the prescription been filled recently? Yes  Is the patient out of the medication? Yes patient she cannot go the weekend without meds she has one left  Has the patient been seen for an appointment in the last year OR does the patient have an upcoming appointment? Yes  Can we respond through MyChart? Yes  Agent: Please be advised that Rx refills may take up to 3 business days. We ask that you follow-up with your pharmacy.

## 2023-09-23 MED ORDER — TRAMADOL HCL 50 MG PO TABS: 50.0000 mg | ORAL_TABLET | Freq: Four times a day (QID) | ORAL | 0 refills | Status: DC | PRN

## 2023-09-28 ENCOUNTER — Other Ambulatory Visit: Payer: Self-pay | Admitting: Family Medicine

## 2023-10-02 DIAGNOSIS — M25561 Pain in right knee: Secondary | ICD-10-CM | POA: Diagnosis not present

## 2023-10-02 DIAGNOSIS — M25661 Stiffness of right knee, not elsewhere classified: Secondary | ICD-10-CM | POA: Diagnosis not present

## 2023-10-05 DIAGNOSIS — M25561 Pain in right knee: Secondary | ICD-10-CM | POA: Diagnosis not present

## 2023-10-05 DIAGNOSIS — M25661 Stiffness of right knee, not elsewhere classified: Secondary | ICD-10-CM | POA: Diagnosis not present

## 2023-10-09 DIAGNOSIS — M25661 Stiffness of right knee, not elsewhere classified: Secondary | ICD-10-CM | POA: Diagnosis not present

## 2023-10-09 DIAGNOSIS — M25561 Pain in right knee: Secondary | ICD-10-CM | POA: Diagnosis not present

## 2023-10-12 DIAGNOSIS — M25561 Pain in right knee: Secondary | ICD-10-CM | POA: Diagnosis not present

## 2023-10-12 DIAGNOSIS — M25661 Stiffness of right knee, not elsewhere classified: Secondary | ICD-10-CM | POA: Diagnosis not present

## 2023-11-06 ENCOUNTER — Encounter: Payer: Medicare Other | Admitting: Family Medicine

## 2023-11-14 ENCOUNTER — Ambulatory Visit: Payer: Medicare Other | Admitting: Cardiology

## 2023-11-19 ENCOUNTER — Encounter: Payer: Self-pay | Admitting: Family Medicine

## 2023-11-21 DIAGNOSIS — G4733 Obstructive sleep apnea (adult) (pediatric): Secondary | ICD-10-CM | POA: Diagnosis not present

## 2023-11-22 ENCOUNTER — Other Ambulatory Visit: Payer: Self-pay | Admitting: Family

## 2023-11-22 ENCOUNTER — Encounter: Payer: Self-pay | Admitting: Family Medicine

## 2023-11-22 MED ORDER — TRAMADOL HCL 50 MG PO TABS
50.0000 mg | ORAL_TABLET | Freq: Four times a day (QID) | ORAL | 0 refills | Status: AC | PRN
Start: 2023-11-22 — End: ?

## 2023-11-23 ENCOUNTER — Telehealth: Payer: Self-pay

## 2023-11-23 NOTE — Telephone Encounter (Signed)
 Copied from CRM #[id number]. Topic: Clinical - Prescription Issue >> Nov 22, 2023  4:30 PM Janise Melia C wrote: Reason for CRM: Terrea Ferrier from [facility] called and asked to speak with pcp about why the patient has had multiple prescribers for controlled substance medications. A prescription was sent in for Tramadol  today. Please call and advise at [phone]

## 2023-11-26 NOTE — Telephone Encounter (Signed)
 Rebecca Balk, MD  Juanna Norman hours ago (5:15 PM)    Please let pharmacy know that Rebecca Brooks is an NP that works with me to cover my patients     Pharmacist was advised of above message.

## 2023-11-27 ENCOUNTER — Other Ambulatory Visit: Payer: Self-pay | Admitting: Family Medicine

## 2023-11-27 NOTE — Progress Notes (Unsigned)
 Electrophysiology Office Note:    Date:  11/28/2023   ID:  Rebecca Brooks, DOB [birth year], MRN [id number]  [facility] Cardiologist:  Oneil Bigness, MD  [facility] Electrophysiologist:  Boyce Byes, MD   Referring MD: Twana Gal, PA   Chief Complaint: Atrial fibrillation  History of Present Illness:    Rebecca Brooks is a 73 year old woman who I am seeing today for an evaluation of atrial fibrillation at the request of Rebecca Brooks.  The patient last saw Rebecca Brooks 13, 2025 in the A-fib clinic.  She has a history of hyperlipidemia, hypertension, sleep apnea, hypothyroidism, atrial fibrillation.  Her atrial fibrillation was diagnosed in October 2023 when she presented to the emergency department.  She was sent home with Eliquis  twice daily for stroke prophylaxis in addition to Cardizem .  When she saw Rebecca Brooks she reported going in and out of atrial fibrillation.  She uses an Apple Watch to monitor her heart rhythms.  Flecainide  50 mg by mouth twice daily was started at the appointment with [facility] and she was sent to me to discuss possible catheter ablation.  I also take care of her husband, Rebecca Brooks.  Today she is doing okay.  She reports her biggest complaint is lower extremity edema that is quite severe and limiting her activities.  Her A-fib burden is still fairly low.  She is hesitant to think about invasive procedures at this time but if it were to recur she would want to proceed with catheter ablation.  She tells me that when she takes Lasix  she does not notice a significant increase in urine output.      Their past medical, social and family history was reviewed.   ROS:   Please see the history of present illness.    All other systems reviewed and are negative.  EKGs/Labs/Other Studies Reviewed:    The following studies were reviewed today:  April 10, 2022 echo EF 60-65 RV normal Mildly dilated left atrium        Physical Exam:     VS:  BP (!) 149/72 (BP Location: Right Arm, Patient Position: Sitting, Cuff Size: Large)   Pulse (!) 55   Ht 5' 0.75" (1.543 m)   Wt 199 lb (90.3 kg)   SpO2 97%   BMI 37.91 kg/m     Wt Readings from Last 3 Encounters:  11/28/23 199 lb (90.3 kg)  09/06/23 196 lb (88.9 kg)  08/01/23 202 lb (91.6 kg)     GEN: no distress CARD: RRR, No MRG.  2+ pitting edema bilateral lower extremities. RESP: No IWOB. CTAB.        ASSESSMENT AND PLAN:    1. Paroxysmal atrial fibrillation (HCC)   2. Encounter for long-term (current) use of high-risk medication   3. Primary hypertension     #Atrial fibrillation #Acute on chronic diastolic heart failure symptomatic.  Has led to multiple hospital visits.  On Eliquis  for stroke prophylaxis Stop flecainide  because she thought was contributing to lower extremity edema.  I am more suspicious that the calcium  channel blocker may be contributing.  I will have her stop the diltiazem  today.  She will start metoprolol  succinate 75 mg by mouth twice daily.  She will stop metoprolol  tartrate.    I would have her increase her Lasix  to 40 mg by mouth once daily.  She will follow-up in our clinic in about 2 weeks.  To get a BMP and mag today.    I will have  her see Dr. Rolm Clos or an APP in about 2 weeks.  She will follow-up with me in 3 months.  #Hypertension Above goal today.  Recommend checking blood pressures 1-2 times per week at home and recording the values.  Recommend bringing these recordings to the primary care physician. With the changes in diltiazem , metoprolol  she may need an antihypertensive agent.  This will be addressed at her 2-week appointment.    Signed, Leanora Prophet. Marven Slimmer, MD, [facility], [facility] 11/28/2023 2:55 PM    Electrophysiology [facility]

## 2023-11-28 ENCOUNTER — Other Ambulatory Visit: Payer: Self-pay

## 2023-11-28 ENCOUNTER — Encounter: Payer: Self-pay | Admitting: Cardiology

## 2023-11-28 ENCOUNTER — Ambulatory Visit: Attending: Cardiology | Admitting: Cardiology

## 2023-11-28 VITALS — BP 149/72 | HR 55 | Ht 60.75 in | Wt 199.0 lb

## 2023-11-28 DIAGNOSIS — I10 Essential (primary) hypertension: Secondary | ICD-10-CM

## 2023-11-28 DIAGNOSIS — I5033 Acute on chronic diastolic (congestive) heart failure: Secondary | ICD-10-CM | POA: Diagnosis not present

## 2023-11-28 DIAGNOSIS — Z79899 Other long term (current) drug therapy: Secondary | ICD-10-CM

## 2023-11-28 DIAGNOSIS — I480 Paroxysmal atrial fibrillation: Secondary | ICD-10-CM

## 2023-11-28 MED ORDER — FUROSEMIDE 40 MG PO TABS
40.0000 mg | ORAL_TABLET | Freq: Every day | ORAL | 3 refills | Status: AC
  Filled 2024-02-21: qty 90, 90d supply, fill #0
  Filled 2024-06-07 – 2024-06-10 (×2): qty 90, 90d supply, fill #1

## 2023-11-28 MED ORDER — METOPROLOL SUCCINATE ER 50 MG PO TB24
75.0000 mg | ORAL_TABLET | Freq: Two times a day (BID) | ORAL | 3 refills | Status: DC
  Filled 2024-02-21: qty 270, 90d supply, fill #0

## 2023-11-28 NOTE — Patient Instructions (Addendum)
 Medication Instructions:  Your physician has recommended you make the following change in your medication:  1) INCREASE Lasix  to 40 mg daily  2) STOP taking diltiazem  3) STOP taking metoprolol  tartrate  4) START taking metoprolol  succinate (Toprol  XL) 75 mg twice daily   *If you need a refill on your cardiac medications before your next appointment, please call your pharmacy*  Labs: TODAY: BMET and Mag  Follow-Up: At [facility], you and your health needs are our priority.  As part of our continuing mission to provide you with exceptional heart care, our providers are all part of one team.  This team includes your primary Cardiologist (physician) and Advanced Practice Providers or APPs (Physician Assistants and Nurse Practitioners) who all work together to provide you with the care you need, when you need it.  Your next appointment:   2 weeks  Provider:   Oneil Bigness, MD or APP  Follow up with Dr. Marven Slimmer in 3 months

## 2023-11-29 LAB — BASIC METABOLIC PANEL WITH GFR
BUN/Creatinine Ratio: 38 — ABNORMAL HIGH (ref 12–28)
BUN: 26 mg/dL (ref 8–27)
CO2: 25 mmol/L (ref 20–29)
Calcium: 10 mg/dL (ref 8.7–10.3)
Chloride: 100 mmol/L (ref 96–106)
Creatinine, Ser: 0.68 mg/dL (ref 0.57–1.00)
Glucose: 82 mg/dL (ref 70–99)
Potassium: 4.8 mmol/L (ref 3.5–5.2)
Sodium: 141 mmol/L (ref 134–144)
eGFR: 92 mL/min/{1.73_m2} (ref >59)

## 2023-11-29 LAB — MAGNESIUM: Magnesium: 2.3 mg/dL (ref 1.6–2.3)

## 2023-12-06 ENCOUNTER — Ambulatory Visit: Admitting: Internal Medicine

## 2023-12-06 ENCOUNTER — Telehealth: Payer: Self-pay | Admitting: Family Medicine

## 2023-12-06 ENCOUNTER — Encounter: Payer: Self-pay | Admitting: Internal Medicine

## 2023-12-06 DIAGNOSIS — R509 Fever, unspecified: Secondary | ICD-10-CM | POA: Diagnosis not present

## 2023-12-06 DIAGNOSIS — J069 Acute upper respiratory infection, unspecified: Secondary | ICD-10-CM | POA: Diagnosis not present

## 2023-12-06 DIAGNOSIS — R059 Cough, unspecified: Secondary | ICD-10-CM | POA: Diagnosis not present

## 2023-12-06 LAB — POCT INFLUENZA A/B
Influenza A, POC: NEGATIVE
Influenza B, POC: NEGATIVE

## 2023-12-06 LAB — POC COVID19 BINAXNOW: SARS Coronavirus 2 Ag: NEGATIVE

## 2023-12-06 NOTE — Progress Notes (Signed)
 Established Patient Office Visit     CC/Reason for Visit: URI symptoms  HPI: Rebecca Brooks is a 73 y.o. female who is coming in today for the above mentioned reasons.  For the past 4 days has been having nasal congestion, postnasal drip, cough and low-grade temperatures.   Past Medical/Surgical History: Past Medical History:  Diagnosis Date   Abnormal nuclear stress test    False positive. Cath 08/23/15 showed Angiographically normal coronary arteries   Anemia    Ankle fracture    Arthritis    Asthma    "asthmatic bronchitis"   Atrial fibrillation (HCC)    Back pain    Chicken pox as a child   Edema of both lower extremities    Essential hypertension 07/17/2011   ACEI d/c 06/2011 for refractory cough> resolved    GERD (gastroesophageal reflux disease)    Hyperlipidemia    Hypertension    Hypothyroidism    Insomnia 11/01/2014   Insulin  resistance    pt reports she was told this in the past, does not take any medication for this   Joint pain    Low back pain radiating to right leg    intermittent and occasional numbness of skin over right hip in certain positions   Measles as a child   Mumps as a child   Obesity    Osteoarthritis    Palpitation    Pneumonia    this October 2013 from which she has said inhailer   PONV (postoperative nausea and vomiting)    "violent vomiting after surgery"   Recurrent epistaxis 02/20/2012   RLS (restless legs syndrome) 11/01/2014   Skin cancer    Sleep apnea    cpap   SOB (shortness of breath) on exertion    Spinal stenosis    Vitamin B 12 deficiency    Vitamin D  deficiency     Past Surgical History:  Procedure Laterality Date   CARDIAC CATHETERIZATION N/A 08/23/2015   Procedure: Left Heart Cath and Coronary Angiography;  Surgeon: Arnoldo Lapping, MD;  Location: [facility] INVASIVE CV LAB;  Service: Cardiovascular;  Laterality: N/A;   CATARACT EXTRACTION Bilateral    CESAREAN SECTION     X 3   GASTRECTOMY  12/2017    INGUINAL HERNIA REPAIR Right 73 yrs old   LUMBAR LAMINECTOMY/DECOMPRESSION MICRODISCECTOMY Bilateral 10/21/2021   Procedure: Laminectomy and Foraminotomy - bilateral - Lumbar Three-Lumbar Four - Lumbar Four- Lumbar Five;  Surgeon: Agustina Aldrich, MD;  Location: [facility] OR;  Service: Neurosurgery;  Laterality: Bilateral;   THYROIDECTOMY     total for benign tumor, Parathyroid spared   TONSILLECTOMY     TOTAL HIP ARTHROPLASTY Right 2006   secondary to congenital  hip defect   TOTAL KNEE ARTHROPLASTY Left 01/22/2023   Procedure: LEFT TOTAL KNEE ARTHROPLASTY;  Surgeon: Liliane Rei, MD;  Location: [facility] ORS;  Service: Orthopedics;  Laterality: Left;   TOTAL KNEE ARTHROPLASTY Right 06/18/2023   Procedure: TOTAL KNEE ARTHROPLASTY;  Surgeon: Liliane Rei, MD;  Location: [facility] ORS;  Service: Orthopedics;  Laterality: Right;    Social History:  reports that she quit smoking about 52 years ago. Her smoking use included cigarettes. She started smoking about 57 years ago. She has a 2.5 pack-year smoking history. She has been exposed to tobacco smoke. She has never used smokeless tobacco. She reports current alcohol use of about 1.0 standard drink of alcohol per week. She reports that she does not use drugs.  Allergies: Allergies  Allergen  Reactions   Bee Venom Anaphylaxis   Accupril [Quinapril Hcl] Cough   Quinapril-Hydrochlorothiazide  Cough   Simvastatin  Other (See Comments)    myalgias   Rosuvastatin  Other (See Comments)    Leg aches.    Family History:  Family History  Problem Relation Age of Onset   Cancer Mother    Heart failure Mother    Lung cancer Mother 48       smoker   Heart Problems Mother        tachycardia   Hearing loss Mother    Anxiety disorder Mother        anxiety, claustrophobia   Obesity Mother    Osteoarthritis Father    Hyperlipidemia Father    Anuerysm Father        AAA   Sudden death Father    Hyperlipidemia Sister    Migraines Sister    Hypertension Sister     Heart Problems Sister        tachycardia   Colon cancer Maternal Grandmother    Heart attack Maternal Grandfather    Aneurysm Paternal Grandfather        abdominal   Heart disease Paternal Grandfather        AAA rupture, smoker     Current Outpatient Medications:    acetaminophen  (TYLENOL ) 650 MG CR tablet, Take 1,300 mg by mouth 2 (two) times daily., Disp: , Rfl:    albuterol  (VENTOLIN  HFA) 108 (90 Base) MCG/ACT inhaler, Inhale 1-2 puffs into the lungs every 6 (six) hours as needed for wheezing or shortness of breath. (Patient taking differently: Inhale 2 puffs into the lungs every 6 (six) hours as needed for wheezing or shortness of breath.), Disp: 1 each, Rfl: 0   apixaban  (ELIQUIS ) 5 MG TABS tablet, Take 1 tablet (5 mg total) by mouth 2 (two) times daily., Disp: 28 tablet, Rfl:    benzonatate  (TESSALON ) 200 MG capsule, Take 1 capsule (200 mg total) by mouth 3 (three) times daily as needed for cough. (Patient taking differently: Take 200 mg by mouth as needed for cough.), Disp: 30 capsule, Rfl: 0   cetirizine  (ZYRTEC ) 10 MG tablet, Take 1 tablet (10 mg total) by mouth daily., Disp: 90 tablet, Rfl: 1   famotidine  (PEPCID ) 20 MG tablet, TAKE 1 TABLET(20 MG) BY MOUTH AT BEDTIME, Disp: 90 tablet, Rfl: 1   fluticasone  (FLONASE ) 50 MCG/ACT nasal spray, SHAKE LIQUID AND USE 2 SPRAYS IN EACH NOSTRIL DAILY, Disp: 48 g, Rfl: 0   furosemide  (LASIX ) 40 MG tablet, Take 1 tablet (40 mg total) by mouth daily., Disp: 90 tablet, Rfl: 3   levothyroxine  (SYNTHROID ) 100 MCG tablet, TAKE 1 TABLET(100 MCG) BY MOUTH DAILY BEFORE BREAKFAST, Disp: 90 tablet, Rfl: 1   metoprolol  succinate (TOPROL -XL) 50 MG 24 hr tablet, Take 1.5 tablets (75 mg total) by mouth in the morning and at bedtime. Take with or immediately following a meal., Disp: 270 tablet, Rfl: 3   ondansetron  (ZOFRAN ) 4 MG tablet, Take 1 tablet (4 mg total) by mouth every 6 (six) hours as needed for nausea. (Patient taking differently: Take 4 mg by mouth  as needed for nausea.), Disp: 20 tablet, Rfl: 0   pantoprazole  (PROTONIX ) 40 MG tablet, TAKE 1 TABLET(40 MG) BY MOUTH DAILY, Disp: 90 tablet, Rfl: 3   polyethylene glycol (MIRALAX  / GLYCOLAX ) 17 g packet, Take 17 g by mouth daily as needed for moderate constipation., Disp: , Rfl:    rOPINIRole  (REQUIP ) 2 MG tablet, Take 1 tablet (2 mg  total) by mouth daily., Disp: 30 tablet, Rfl: 5   traMADol  (ULTRAM ) 50 MG tablet, Take 1-2 tablets (50-100 mg total) by mouth every 6 (six) hours as needed for moderate pain (pain score 4-6)., Disp: 90 tablet, Rfl: 0   Vitamin D3 (VITAMIN D ) 25 MCG tablet, Take 1 tablet (1,000 Units total) by mouth daily., Disp: 60 tablet, Rfl: 1  Review of Systems:  Negative unless indicated in HPI.   Physical Exam: There were no vitals filed for this visit.  There is no height or weight on file to calculate BMI.   Physical Exam Vitals reviewed.  Constitutional:      Appearance: Normal appearance.  HENT:     Right Ear: Tympanic membrane, ear canal and external ear normal.     Left Ear: Tympanic membrane, ear canal and external ear normal.     Mouth/Throat:     Mouth: Mucous membranes are moist.     Pharynx: Posterior oropharyngeal erythema present.  Eyes:     Conjunctiva/sclera: Conjunctivae normal.  Cardiovascular:     Rate and Rhythm: Normal rate and regular rhythm.  Pulmonary:     Effort: Pulmonary effort is normal.     Breath sounds: Normal breath sounds.  Neurological:     Mental Status: She is alert.      Impression and Plan:  Upper respiratory tract infection, unspecified type  Cough, unspecified type -     POCT Influenza A/B -     POC COVID-19 BinaxNow  Fever, unspecified fever cause -     POCT Influenza A/B -     POC COVID-19 BinaxNow  -Given exam findings, PNA, pharyngitis, ear infection are not likely, hence abx have not been prescribed. -Have advised rest, fluids, OTC antihistamines, cough suppressants and mucinex . -RTC if no improvement  in 10-14 days.     Time spent:21 minutes reviewing chart, interviewing and examining patient and formulating plan of care.     Marguerita Shih, MD [facility]

## 2023-12-06 NOTE — Telephone Encounter (Signed)
 Copied from CRM [id number]. Topic: Clinical - Medical Advice >> Dec 06, 2023  5:14 PM Clyde Darling P wrote: Reason for CRM: pt advise she had visit today  and was suppose to be prescribe albuterol  (VENTOLIN  HFA) 108 (90 Base) MCG/ACT inhaler. Need to see if there is anything they are able to identify as needing to be prescribed

## 2023-12-10 ENCOUNTER — Other Ambulatory Visit: Payer: Self-pay | Admitting: Internal Medicine

## 2023-12-10 DIAGNOSIS — Z8616 Personal history of COVID-19: Secondary | ICD-10-CM

## 2023-12-10 MED ORDER — ALBUTEROL SULFATE HFA 108 (90 BASE) MCG/ACT IN AERS: 1.0000 | INHALATION_SPRAY | Freq: Four times a day (QID) | RESPIRATORY_TRACT | 0 refills | Status: AC | PRN

## 2023-12-18 NOTE — Assessment & Plan Note (Signed)
 [id number] acceptable, minimize simple carbs. Increase exercise as tolerated.

## 2023-12-18 NOTE — Assessment & Plan Note (Signed)
Hydrate and monitor labs 

## 2023-12-18 NOTE — Assessment & Plan Note (Signed)
 Patient encouraged to maintain heart healthy diet, regular exercise, adequate sleep. Consider daily probiotics. Take medications as prescribed. Labs ordered and reviewed. She continues to follow with Dr Dyanna Glasgow of GYN for pap and mammogram. colonocopy in 2017 repeat in 10 years. Dexa 08/2022 repeat in 2-5 years. Given and reviewed copy of ACP documents from [employer] and encouraged to complete and return

## 2023-12-18 NOTE — Assessment & Plan Note (Signed)
 Well controlled, no changes to meds. Encouraged heart healthy diet such as the DASH diet and exercise as tolerated.

## 2023-12-18 NOTE — Assessment & Plan Note (Signed)
Encourage heart healthy diet such as MIND or DASH diet, increase exercise, avoid trans fats, simple carbohydrates and processed foods, consider a krill or fish or flaxseed oil cap daily. Tolerating Simvastatin 

## 2023-12-18 NOTE — Assessment & Plan Note (Signed)
 On Levothyroxine, continue to monitor

## 2023-12-20 ENCOUNTER — Encounter: Payer: Self-pay | Admitting: Family Medicine

## 2023-12-20 ENCOUNTER — Ambulatory Visit: Payer: Medicare Other | Admitting: Family Medicine

## 2023-12-20 ENCOUNTER — Ambulatory Visit: Attending: Physician Assistant | Admitting: Cardiology

## 2023-12-20 ENCOUNTER — Encounter: Payer: Self-pay | Admitting: Physician Assistant

## 2023-12-20 VITALS — BP 120/60 | HR 59 | Ht 60.75 in | Wt 197.0 lb

## 2023-12-20 VITALS — BP 128/86 | HR 62 | Resp 16 | Ht 60.75 in | Wt 198.2 lb

## 2023-12-20 DIAGNOSIS — Z0000 Encounter for general adult medical examination without abnormal findings: Secondary | ICD-10-CM | POA: Diagnosis not present

## 2023-12-20 DIAGNOSIS — G4733 Obstructive sleep apnea (adult) (pediatric): Secondary | ICD-10-CM

## 2023-12-20 DIAGNOSIS — D539 Nutritional anemia, unspecified: Secondary | ICD-10-CM

## 2023-12-20 DIAGNOSIS — I480 Paroxysmal atrial fibrillation: Secondary | ICD-10-CM | POA: Diagnosis not present

## 2023-12-20 DIAGNOSIS — E782 Mixed hyperlipidemia: Secondary | ICD-10-CM

## 2023-12-20 DIAGNOSIS — I10 Essential (primary) hypertension: Secondary | ICD-10-CM

## 2023-12-20 DIAGNOSIS — Z1231 Encounter for screening mammogram for malignant neoplasm of breast: Secondary | ICD-10-CM | POA: Diagnosis not present

## 2023-12-20 DIAGNOSIS — M419 Scoliosis, unspecified: Secondary | ICD-10-CM

## 2023-12-20 DIAGNOSIS — R252 Cramp and spasm: Secondary | ICD-10-CM

## 2023-12-20 DIAGNOSIS — R739 Hyperglycemia, unspecified: Secondary | ICD-10-CM | POA: Diagnosis not present

## 2023-12-20 DIAGNOSIS — E039 Hypothyroidism, unspecified: Secondary | ICD-10-CM | POA: Diagnosis not present

## 2023-12-20 DIAGNOSIS — R29898 Other symptoms and signs involving the musculoskeletal system: Secondary | ICD-10-CM

## 2023-12-20 LAB — LIPID PANEL
Cholesterol: 225 mg/dL — ABNORMAL HIGH (ref 0–200)
HDL: 62.7 mg/dL (ref >39.00)
LDL Cholesterol: 143 mg/dL — ABNORMAL HIGH (ref 0–99)
NonHDL: 161.86
Total CHOL/HDL Ratio: 4
Triglycerides: 94 mg/dL (ref 0.0–149.0)
VLDL: 18.8 mg/dL (ref 0.0–40.0)

## 2023-12-20 LAB — CBC WITH DIFFERENTIAL/PLATELET
Basophils Absolute: 0.1 10*3/uL (ref 0.0–0.1)
Basophils Relative: 1 % (ref 0.0–3.0)
Eosinophils Absolute: 0.1 10*3/uL (ref 0.0–0.7)
Eosinophils Relative: 1.5 % (ref 0.0–5.0)
HCT: 39 % (ref 36.0–46.0)
Hemoglobin: 13 g/dL (ref 12.0–15.0)
Lymphocytes Relative: 19.5 % (ref 12.0–46.0)
Lymphs Abs: 1.1 10*3/uL (ref 0.7–4.0)
MCHC: 33.3 g/dL (ref 30.0–36.0)
MCV: 92.1 fl (ref 78.0–100.0)
Monocytes Absolute: 0.5 10*3/uL (ref 0.1–1.0)
Monocytes Relative: 9.5 % (ref 3.0–12.0)
Neutro Abs: 3.9 10*3/uL (ref 1.4–7.7)
Neutrophils Relative %: 68.5 % (ref 43.0–77.0)
Platelets: 311 10*3/uL (ref 150.0–400.0)
RBC: 4.24 Mil/uL (ref 3.87–5.11)
RDW: 13.3 % (ref 11.5–15.5)
WBC: 5.7 10*3/uL (ref 4.0–10.5)

## 2023-12-20 LAB — COMPREHENSIVE METABOLIC PANEL WITH GFR
ALT: 9 U/L (ref 0–35)
AST: 14 U/L (ref 0–37)
Albumin: 4.1 g/dL (ref 3.5–5.2)
Alkaline Phosphatase: 83 U/L (ref 39–117)
BUN: 27 mg/dL — ABNORMAL HIGH (ref 6–23)
CO2: 31 meq/L (ref 19–32)
Calcium: 9.5 mg/dL (ref 8.4–10.5)
Chloride: 102 meq/L (ref 96–112)
Creatinine, Ser: 0.74 mg/dL (ref 0.40–1.20)
GFR: 80.24 mL/min (ref >60.00)
Glucose, Bld: 93 mg/dL (ref 70–99)
Potassium: 4.8 meq/L (ref 3.5–5.1)
Sodium: 140 meq/L (ref 135–145)
Total Bilirubin: 0.6 mg/dL (ref 0.2–1.2)
Total Protein: 6.3 g/dL (ref 6.0–8.3)

## 2023-12-20 LAB — VITAMIN B12: Vitamin B-12: 279 pg/mL (ref 211–911)

## 2023-12-20 LAB — TSH: TSH: 1.04 u[IU]/mL (ref 0.35–5.50)

## 2023-12-20 LAB — HEMOGLOBIN A1C: Hgb A1c MFr Bld: 5.1 % (ref 4.6–6.5)

## 2023-12-20 NOTE — Patient Instructions (Signed)
 Medication Instructions:  NO CHANGES *If you need a refill on your cardiac medications before your next appointment, please call your pharmacy*  Lab Work: NO LABS If you have labs (blood work) drawn today and your tests are completely normal, you will receive your results only by: MyChart Message (if you have MyChart) OR A paper copy in the mail If you have any lab test that is abnormal or we need to change your treatment, we will call you to review the results.  Testing/Procedures: NO TESTING  Follow-Up: At [facility], you and your health needs are our priority.  As part of our continuing mission to provide you with exceptional heart care, our providers are all part of one team.  This team includes your primary Cardiologist (physician) and Advanced Practice Providers or APPs (Physician Assistants and Nurse Practitioners) who all work together to provide you with the care you need, when you need it.  Your next appointment:   KEEP FOLLOW UP WITH ELECTROPHYSIOLOGY AUGUST 2025   Provider:   Harvie Liner, MD   Other Instructions MONITOR BLOOD PRESSURE AND KEEP DAILY LOG, BRING LOG TO NEXT OFFICE VISIT

## 2023-12-20 NOTE — Progress Notes (Unsigned)
 Subjective:    [name], female    DOB: [birth year], 73 y.o.   MRN: [id number]  Chief Complaint  Patient presents with   Annual Exam    Patient presents today for a physical exam.   Quality Metric Gaps    AWV, Hep C, pneumonia, zoster, TDAP    HPI Discussed the use of AI scribe software for clinical note transcription with the patient, who gave verbal consent to proceed.  History of Present Illness Rebecca Brooks is a 73 year old female with scoliosis and degenerative disc disease who presents with mobility issues and right hip weakness.  She underwent a knee replacement approximately six months ago and has been participating in physical therapy since. She experiences a persistent wobble while walking, which she attributes to either scoliosis or a previous hip replacement in 2006 that resulted in ongoing weakness. Despite the wobble, she is not using a cane, believing that avoiding it is helping her gain strength.  She has been experiencing pain in her right lower back and has a history of scoliosis and degenerative disc disease. She has not been able to ascend stairs reciprocally for twenty years but has recently started doing so, although she still descends with both feet on each step.  She has lost a significant amount of weight, which she attributes to following the [place] program and working as an Doctor, hospital. This weight loss has improved her breathing, mobility, and sleep. Her recent blood work showed low protein levels. She consumes an extra protein shake daily, which contains 24 grams of protein, and occasionally takes essential amino acids.  She is considering joining a fitness center with her husband to further improve her hip strength and overall fitness.    Past Medical History:  Diagnosis Date   Abnormal nuclear stress test    False positive. Cath 08/23/15 showed Angiographically normal coronary arteries   Anemia    Ankle fracture     Arthritis    Asthma    "asthmatic bronchitis"   Atrial fibrillation (HCC)    Back pain    Chicken pox as a child   Edema of both lower extremities    Essential hypertension 07/17/2011   ACEI d/c 06/2011 for refractory cough> resolved    GERD (gastroesophageal reflux disease)    Hyperlipidemia    Hypertension    Hypothyroidism    Insomnia 11/01/2014   Insulin  resistance    pt reports she was told this in the past, does not take any medication for this   Joint pain    Low back pain radiating to right leg    intermittent and occasional numbness of skin over right hip in certain positions   Measles as a child   Mumps as a child   Obesity    Osteoarthritis    Palpitation    Pneumonia    this October 2013 from which she has said inhailer   PONV (postoperative nausea and vomiting)    "violent vomiting after surgery"   Recurrent epistaxis 02/20/2012   RLS (restless legs syndrome) 11/01/2014   Skin cancer    Sleep apnea    cpap   SOB (shortness of breath) on exertion    Spinal stenosis    Vitamin B 12 deficiency    Vitamin D  deficiency     Past Surgical History:  Procedure Laterality Date   CARDIAC CATHETERIZATION N/A 08/23/2015   Procedure: Left Heart Cath and Coronary Angiography;  Surgeon: Arnoldo Lapping, MD;  Location: [facility];  Service: Cardiovascular;  Laterality: N/A;   CATARACT EXTRACTION Bilateral    CESAREAN SECTION     X 3   GASTRECTOMY  12/2017   INGUINAL HERNIA REPAIR Right 73 yrs old   LUMBAR LAMINECTOMY/DECOMPRESSION MICRODISCECTOMY Bilateral 10/21/2021   Procedure: Laminectomy and Foraminotomy - bilateral - Lumbar Three-Lumbar Four - Lumbar Four- Lumbar Five;  Surgeon: Agustina Aldrich, MD;  Location: [facility];  Service: Neurosurgery;  Laterality: Bilateral;   THYROIDECTOMY     total for benign tumor, Parathyroid spared   TONSILLECTOMY     TOTAL HIP ARTHROPLASTY Right 2006   secondary to congenital  hip defect   TOTAL KNEE ARTHROPLASTY Left 01/22/2023    Procedure: LEFT TOTAL KNEE ARTHROPLASTY;  Surgeon: Liliane Rei, MD;  Location: [facility];  Service: Orthopedics;  Laterality: Left;   TOTAL KNEE ARTHROPLASTY Right 06/18/2023   Procedure: TOTAL KNEE ARTHROPLASTY;  Surgeon: Liliane Rei, MD;  Location: [facility];  Service: Orthopedics;  Laterality: Right;    Family History  Problem Relation Age of Onset   Cancer Mother    Heart failure Mother    Lung cancer Mother 45       smoker   Heart Problems Mother        tachycardia   Hearing loss Mother    Anxiety disorder Mother        anxiety, claustrophobia   Obesity Mother    Osteoarthritis Father    Hyperlipidemia Father    Anuerysm Father        AAA   Sudden death Father    Hyperlipidemia Sister    Migraines Sister    Hypertension Sister    Heart Problems Sister        tachycardia   Colon cancer Maternal Grandmother    Heart attack Maternal Grandfather    Aneurysm Paternal Grandfather        abdominal   Heart disease Paternal Grandfather        AAA rupture, smoker    Social History   Socioeconomic History   Marital status: Married    Spouse name: Not on file   Number of children: 3   Years of education: Not on file   Highest education level: Not on file  Occupational History   Occupation: Tree surgeon   Occupation: Careers information officer   Occupation: Retired Education administrator  Tobacco Use   Smoking status: Former    Current packs/day: 0.00    Average packs/day: 0.5 packs/day for 5.0 years (2.5 ttl pk-yrs)    Types: Cigarettes    Start date: 07/24/1966    Quit date: 07/25/1971    Years since quitting: 52.4    Passive exposure: Past   Smokeless tobacco: Never   Tobacco comments:    Former smoker 06/06/23  Vaping Use   Vaping status: Never Used  Substance and Sexual Activity   Alcohol use: Yes    Alcohol/week: 1.0 standard drink of alcohol    Types: 1 Standard drinks or equivalent per week    Comment: vodka/gin something clear 06/06/23   Drug use: No   Sexual activity: Never    Comment:  lives with husband, 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  Other Topics Concern   Not on file  Social History Narrative   Not on file   Social Drivers of Health   Financial Resource Strain: Low Risk  (09/13/2022)   Overall Financial Resource Strain (CARDIA)    Difficulty of Paying Living Expenses: Not very hard  Food Insecurity: No Food  Insecurity (06/18/2023)   Hunger Vital Sign    Worried About Running Out of Food in the Last Year: Never true    Ran Out of Food in the Last Year: Never true  Transportation Needs: No Transportation Needs (06/18/2023)   PRAPARE - Administrator, Civil Service (Medical): No    Lack of Transportation (Non-Medical): No  Physical Activity: Inactive (09/13/2022)   Exercise Vital Sign    Days of Exercise per Week: 0 days    Minutes of Exercise per Session: 0 min  Stress: Stress Concern Present (12/27/2022)   Received from [facility], [facility] Occupational [facility] - Occupational Stress Questionnaire    Feeling of Stress : Rather much  Social Connections: Unknown (12/21/2022)   [facility] from [facility], [facility]   Social Network    Social Network: Not on file  Intimate Partner Violence: Not At Risk (06/18/2023)   Humiliation, Afraid, Rape, and Kick questionnaire    Fear of Current or Ex-Partner: No    Emotionally Abused: No    Physically Abused: No    Sexually Abused: No    Outpatient Medications Prior to Visit  Medication Sig Dispense Refill   acetaminophen  (TYLENOL ) 650 MG CR tablet Take 1,300 mg by mouth 2 (two) times daily.     albuterol  (VENTOLIN  HFA) 108 (90 Base) MCG/ACT inhaler Inhale 1-2 puffs into the lungs every 6 (six) hours as needed for wheezing or shortness of breath. 1 each 0   apixaban  (ELIQUIS ) 5 MG TABS tablet Take 1 tablet (5 mg total) by mouth 2 (two) times daily. 28 tablet    benzonatate  (TESSALON ) 200 MG capsule Take 1 capsule (200 mg total) by mouth 3 (three) times daily as needed for  cough. (Patient taking differently: Take 200 mg by mouth as needed for cough.) 30 capsule 0   cetirizine  (ZYRTEC ) 10 MG tablet Take 1 tablet (10 mg total) by mouth daily. 90 tablet 1   famotidine  (PEPCID ) 20 MG tablet TAKE 1 TABLET(20 MG) BY MOUTH AT BEDTIME 90 tablet 1   fluticasone  (FLONASE ) 50 MCG/ACT nasal spray SHAKE LIQUID AND USE 2 SPRAYS IN EACH NOSTRIL DAILY 48 g 0   furosemide  (LASIX ) 40 MG tablet Take 1 tablet (40 mg total) by mouth daily. 90 tablet 3   levothyroxine  (SYNTHROID ) 100 MCG tablet TAKE 1 TABLET(100 MCG) BY MOUTH DAILY BEFORE BREAKFAST 90 tablet 1   metoprolol  succinate (TOPROL -XL) 50 MG 24 hr tablet Take 1.5 tablets (75 mg total) by mouth in the morning and at bedtime. Take with or immediately following a meal. 270 tablet 3   ondansetron  (ZOFRAN ) 4 MG tablet Take 1 tablet (4 mg total) by mouth every 6 (six) hours as needed for nausea. (Patient taking differently: Take 4 mg by mouth as needed for nausea.) 20 tablet 0   pantoprazole  (PROTONIX ) 40 MG tablet TAKE 1 TABLET(40 MG) BY MOUTH DAILY 90 tablet 3   polyethylene glycol (MIRALAX  / GLYCOLAX ) 17 g packet Take 17 g by mouth daily as needed for moderate constipation.     rOPINIRole  (REQUIP ) 2 MG tablet Take 1 tablet (2 mg total) by mouth daily. 30 tablet 5   traMADol  (ULTRAM ) 50 MG tablet Take 1-2 tablets (50-100 mg total) by mouth every 6 (six) hours as needed for moderate pain (pain score 4-6). 90 tablet 0   Vitamin D3 (VITAMIN D ) 25 MCG tablet Take 1 tablet (1,000 Units total) by mouth daily. 60 tablet 1  No facility-administered medications prior to visit.    Allergies  Allergen Reactions   Bee Venom Anaphylaxis   Accupril [Quinapril Hcl] Cough   Quinapril-Hydrochlorothiazide  Cough   Simvastatin  Other (See Comments)    myalgias   Rosuvastatin  Other (See Comments)    Leg aches.    Review of Systems  Constitutional:  Negative for chills, fever and malaise/fatigue.  HENT:  Negative for congestion and hearing  loss.   Eyes:  Negative for discharge.  Respiratory:  Negative for cough, sputum production and shortness of breath.   Cardiovascular:  Negative for chest pain, palpitations and leg swelling.  Gastrointestinal:  Negative for abdominal pain, blood in stool, constipation, diarrhea, heartburn, nausea and vomiting.  Genitourinary:  Negative for dysuria, frequency, hematuria and urgency.  Musculoskeletal:  Positive for joint pain, myalgias and neck pain. Negative for back pain and falls.  Skin:  Negative for rash.  Neurological:  Positive for focal weakness. Negative for dizziness, sensory change, loss of consciousness, weakness and headaches.  Endo/Heme/Allergies:  Negative for environmental allergies. Does not bruise/bleed easily.  Psychiatric/Behavioral:  Negative for depression and suicidal ideas. The patient is not nervous/anxious and does not have insomnia.        Objective:     Physical Exam Constitutional:      General: She is not in acute distress.    Appearance: Normal appearance. She is obese. She is not diaphoretic.  HENT:     Head: Normocephalic and atraumatic.     Right Ear: Tympanic membrane, ear canal and external ear normal.     Left Ear: Tympanic membrane, ear canal and external ear normal.     Nose: Nose normal.     Mouth/Throat:     Mouth: Mucous membranes are moist.     Pharynx: Oropharynx is clear. No oropharyngeal exudate.  Eyes:     General: No scleral icterus.       Right eye: No discharge.        Left eye: No discharge.     Conjunctiva/sclera: Conjunctivae normal.     Pupils: Pupils are equal, round, and reactive to light.  Neck:     Thyroid : No thyromegaly.  Cardiovascular:     Rate and Rhythm: Normal rate and regular rhythm.     Heart sounds: Murmur heard.  Pulmonary:     Effort: Pulmonary effort is normal. No respiratory distress.     Breath sounds: Normal breath sounds. No wheezing or rales.  Abdominal:     General: Bowel sounds are normal. There  is no distension.     Palpations: Abdomen is soft. There is no mass.     Tenderness: There is no abdominal tenderness.  Musculoskeletal:        General: No tenderness. Normal range of motion.     Cervical back: Normal range of motion and neck supple.  Lymphadenopathy:     Cervical: No cervical adenopathy.  Skin:    General: Skin is warm and dry.     Findings: No rash.  Neurological:     General: No focal deficit present.     Mental Status: She is alert and oriented to person, place, and time.     Cranial Nerves: No cranial nerve deficit.     Coordination: Coordination normal.     Deep Tendon Reflexes: Reflexes are normal and symmetric. Reflexes normal.  Psychiatric:        Mood and Affect: Mood normal.        Behavior: Behavior normal.  Thought Content: Thought content normal.        Judgment: Judgment normal.     BP 128/86   Pulse 62   Resp 16   Ht 5' 0.75" (1.543 m)   Wt 198 lb 3.2 oz (89.9 kg)   SpO2 96%   BMI 37.76 kg/m  Wt Readings from Last 3 Encounters:  12/20/23 198 lb 3.2 oz (89.9 kg)  11/28/23 199 lb (90.3 kg)  09/06/23 196 lb (88.9 kg)    Diabetic Foot Exam - Simple   No data filed    Lab Results  Component Value Date   WBC 12.8 (H) 06/19/2023   HGB 11.3 (L) 06/19/2023   HCT 35.1 (L) 06/19/2023   PLT 239 06/19/2023   GLUCOSE 82 11/28/2023   CHOL 231 (H) 05/01/2023   TRIG 81.0 05/01/2023   HDL 70.20 05/01/2023   LDLCALC 144 (H) 05/01/2023   ALT 19 05/01/2023   AST 14 05/01/2023   NA 141 11/28/2023   K 4.8 11/28/2023   CL 100 11/28/2023   CREATININE 0.68 11/28/2023   BUN 26 11/28/2023   CO2 25 11/28/2023   TSH 0.79 05/01/2023   INR 0.99 08/19/2015   HGBA1C 5.3 05/01/2023    Lab Results  Component Value Date   TSH 0.79 05/01/2023   Lab Results  Component Value Date   WBC 12.8 (H) 06/19/2023   HGB 11.3 (L) 06/19/2023   HCT 35.1 (L) 06/19/2023   MCV 98.6 06/19/2023   PLT 239 06/19/2023   Lab Results  Component Value Date    NA 141 11/28/2023   K 4.8 11/28/2023   CHLORIDE 103 12/21/2016   CO2 25 11/28/2023   GLUCOSE 82 11/28/2023   BUN 26 11/28/2023   CREATININE 0.68 11/28/2023   BILITOT 0.6 05/01/2023   ALKPHOS 75 05/01/2023   AST 14 05/01/2023   ALT 19 05/01/2023   PROT 5.5 (L) 05/01/2023   ALBUMIN 3.8 05/01/2023   CALCIUM  10.0 11/28/2023   ANIONGAP 7 06/19/2023   EGFR 92 11/28/2023   GFR 88.03 05/01/2023   Lab Results  Component Value Date   CHOL 231 (H) 05/01/2023   Lab Results  Component Value Date   HDL 70.20 05/01/2023   Lab Results  Component Value Date   LDLCALC 144 (H) 05/01/2023   Lab Results  Component Value Date   TRIG 81.0 05/01/2023   Lab Results  Component Value Date   CHOLHDL 3 05/01/2023   Lab Results  Component Value Date   HGBA1C 5.3 05/01/2023       Assessment & Plan:  Annual physical exam Assessment & Plan: Patient encouraged to maintain heart healthy diet, regular exercise, adequate sleep. Consider daily probiotics. Take medications as prescribed. Labs ordered and reviewed. She continues to follow with Dr Dyanna Glasgow of GYN for pap and mammogram. colonocopy in 2017 repeat in 10 years. Dexa 08/2022 repeat in 2-5 years. Given and reviewed copy of ACP documents from [facility] Secretary of State and encouraged to complete and return    Essential hypertension Assessment & Plan: Well controlled, no changes to meds. Encouraged heart healthy diet such as the DASH diet and exercise as tolerated.   Orders: -     Comprehensive metabolic panel with GFR -     CBC with Differential/Platelet  Hyperglycemia Assessment & Plan: hgba1c acceptable, minimize simple carbs. Increase exercise as tolerated.   Orders: -     Comprehensive metabolic panel with GFR -     CBC with Differential/Platelet -  Hemoglobin A1c  Mixed hyperlipidemia Assessment & Plan: Encourage heart healthy diet such as MIND or DASH diet, increase exercise, avoid trans fats, simple carbohydrates and  processed foods, consider a krill or fish or flaxseed oil cap daily. Tolerating Simvastatin   Orders: -     Lipid panel  Hypothyroidism, unspecified type Assessment & Plan: On Levothyroxine , continue to monitor  Orders: -     TSH  Muscle cramps Assessment & Plan: Hydrate and monitor labs   Weakness of right hip -     Amb[facility] referral to [facility]  Scoliosis, unspecified scoliosis type, unspecified spinal region -     Ambulatory referral to [facility]  Macrocytic anemia -     Vitamin B12  Preventative health care Assessment & Plan:     Breast cancer screening by mammogram -     3D Screening Mammogram, Left and Right; Future    Assessment and Plan Assessment & Plan Scoliosis Scoliosis contributing to back pain and gait instability. - Refer to [facility] for evaluation and physical therapy focusing on scoliosis and right hip weakness.  Right hip weakness post-replacement Persistent right hip weakness post-2006 hip replacement contributing to gait instability. - Refer to [facility] for evaluation and physical therapy focusing on right hip weakness.  Degenerative disc disease Degenerative disc disease causing chronic right lower back pain. - Refer to [facility] for evaluation and physical therapy focusing on degenerative disc disease and right hip weakness.      Randie Bustle, MD

## 2023-12-20 NOTE — Patient Instructions (Addendum)
 Shingrix is the new shingles shot, 2 shots over 2-6 months, confirm coverage with insurance and document, then can return here for shots with nurse appt or at pharmacy  Prevnar 20 at pharmacy  RSV, Respiratory Syncial Virus vaccine at pharmacy Annual flu and covid  Tetanus at pharmacy  4000, 8000 steps Sleep Water : 60-80 ounces daily but 5-10 ounces every 1-2 hours   Get us  a copy of your advanced directives, ie healthcare power of attorney and living will   Preventive Care 65 Years and Older, Female Preventive care refers to lifestyle choices and visits with your health care provider that can promote health and wellness. Preventive care visits are also called wellness exams. What can I expect for my preventive care visit? Counseling Your health care provider may ask you questions about your: Medical history, including: Past medical problems. Family medical history. Pregnancy and menstrual history. History of falls. Current health, including: Memory and ability to understand (cognition). Emotional well-being. Home life and relationship well-being. Sexual activity and sexual health. Lifestyle, including: Alcohol, nicotine or tobacco, and drug use. Access to firearms. Diet, exercise, and sleep habits. Work and work Astronomer. Sunscreen use. Safety issues such as seatbelt and bike helmet use. Physical exam Your health care provider will check your: Height and weight. These may be used to calculate your BMI (body mass index). BMI is a measurement that tells if you are at a healthy weight. Waist circumference. This measures the distance around your waistline. This measurement also tells if you are at a healthy weight and may help predict your risk of certain diseases, such as type 2 diabetes and high blood pressure. Heart rate and blood pressure. Body temperature. Skin for abnormal spots. What immunizations do I need?  Vaccines are usually given at various ages, according to  a schedule. Your health care provider will recommend vaccines for you based on your age, medical history, and lifestyle or other factors, such as travel or where you work. What tests do I need? Screening Your health care provider may recommend screening tests for certain conditions. This may include: Lipid and cholesterol levels. Hepatitis C test. Hepatitis B test. HIV (human immunodeficiency virus) test. STI (sexually transmitted infection) testing, if you are at risk. Lung cancer screening. Colorectal cancer screening. Diabetes screening. This is done by checking your blood sugar (glucose) after you have not eaten for a while (fasting). Mammogram. Talk with your health care provider about how often you should have regular mammograms. BRCA-related cancer screening. This may be done if you have a family history of breast, ovarian, tubal, or peritoneal cancers. Bone density scan. This is done to screen for osteoporosis. Talk with your health care provider about your test results, treatment options, and if necessary, the need for more tests. Follow these instructions at home: Eating and drinking  Eat a diet that includes fresh fruits and vegetables, whole grains, lean protein, and low-fat dairy products. Limit your intake of foods with high amounts of sugar, saturated fats, and salt. Take vitamin and mineral supplements as recommended by your health care provider. Do not drink alcohol if your health care provider tells you not to drink. If you drink alcohol: Limit how much you have to 0-1 drink a day. Know how much alcohol is in your drink. In the [place] one drink equals one 12 oz bottle of beer (355 mL), one 5 oz glass of wine (148 mL), or one 1 oz glass of hard liquor (44 mL). Lifestyle Brush your teeth every morning  and night with fluoride toothpaste. Floss one time each day. Exercise for at least 30 minutes 5 or more days each week. Do not use any products that contain nicotine or  tobacco. These products include cigarettes, chewing tobacco, and vaping devices, such as e-cigarettes. If you need help quitting, ask your health care provider. Do not use drugs. If you are sexually active, practice safe sex. Use a condom or other form of protection in order to prevent STIs. Take aspirin  only as told by your health care provider. Make sure that you understand how much to take and what form to take. Work with your health care provider to find out whether it is safe and beneficial for you to take aspirin  daily. Ask your health care provider if you need to take a cholesterol-lowering medicine (statin). Find healthy ways to manage stress, such as: Meditation, yoga, or listening to music. Journaling. Talking to a trusted person. Spending time with friends and family. Minimize exposure to UV radiation to reduce your risk of skin cancer. Safety Always wear your seat belt while driving or riding in a vehicle. Do not drive: If you have been drinking alcohol. Do not ride with someone who has been drinking. When you are tired or distracted. While texting. If you have been using any mind-altering substances or drugs. Wear a helmet and other protective equipment during sports activities. If you have firearms in your house, make sure you follow all gun safety procedures. What's next? Visit your health care provider once a year for an annual wellness visit. Ask your health care provider how often you should have your eyes and teeth checked. Stay up to date on all vaccines. This information is not intended to replace advice given to you by your health care provider. Make sure you discuss any questions you have with your health care provider. Document Revised: 01/05/2021 Document Reviewed: 01/05/2021 Elsevier Patient Education  2024 [employer].

## 2023-12-20 NOTE — Progress Notes (Signed)
 Cardiology Office Note:  .   Date:  12/20/2023  ID:  Rebecca Brooks, DOB [birth year], MRN [id number] PCP: Neda Balk, MD  [facility] Providers Cardiologist:  Oneil Bigness, MD Electrophysiologist:  Boyce Byes, MD {  History of Present Illness: .   Rebecca Brooks is a 73 y.o. female with history of paroxysmal atrial fibrillation, chronic HFpEF, hypertension, hyperlipidemia, hypothyroidism, OSA on CPAP.    PAF Diagnosed 04/2022 during hospitalization, started on Eliquis  and diltiazem . Early readmission with [facility], spontaneously converted in the emergency room after vagal maneuvers. 08/2023 A-fib clinic started flecainide  50 mg twice daily Patient stopped flecainide  due to lower extremity edema but also on CCB.  Diltiazem  stopped.  Started on Toprol  XL 75 mg twice daily     Patient is followed atrial fibrillation clinic and EP for PAF.  Recently seen by EP earlier this month May 2025 noting acute HFpEF exacerbation with reported multiple hospital visits, flecainide  and CCB stopped.  Started on Toprol  XL.  Today she is here to follow-up on HFpEF exacerbation and blood pressure since she stopped her diltiazem .  Overall patient feels back to baseline.  She notes that she recently went on a anniversary trip to the [place] and had significant peripheral edema that has completely gone away with the Lasix  40 mg.  Reports good urinary output on this.  No respiratory symptoms such as orthopnea or shortness of breath.  She denies any racing heart rates, palpitations, or obvious events of atrial fibrillation.  Tolerating her Toprol -XL well.  No dizziness or syncope.  ROS: Denies: Chest pain, shortness of breath, orthopnea, peripheral edema, palpitations, decreased exercise intolerance, fatigue, lightheadedness.   Studies Reviewed: .         Risk Assessment/Calculations:    CHA2DS2-VASc Score = 3   This indicates a 3.2% annual risk of stroke. The patient's score  is based upon: CHF History: 0 HTN History: 1 Diabetes History: 0 Stroke History: 0 Vascular Disease History: 0 Age Score: 1 Gender Score: 1          Physical Exam:   VS:  BP 120/60 (BP Location: Left Arm, Patient Position: Sitting, Cuff Size: Large)   Pulse (!) 59   Ht 5' 0.75" (1.543 m)   Wt 197 lb (89.4 kg)   SpO2 97%   BMI 37.53 kg/m    Wt Readings from Last 3 Encounters:  12/20/23 197 lb (89.4 kg)  12/20/23 198 lb 3.2 oz (89.9 kg)  11/28/23 199 lb (90.3 kg)    GEN: Well nourished, well developed in no acute distress NECK: No JVD; No carotid bruits CARDIAC: RRR, 2 out of 6 murmur RESPIRATORY:  Clear to auscultation without rales, wheezing or rhonchi  ABDOMEN: Soft, non-tender, non-distended EXTREMITIES:  No edema; No deformity   ASSESSMENT AND PLAN: .    Chronic HFpEF  Main complaint peripheral edema, likely exacerbated by recent vacation to the [place], Lasix  was increased to 40 mg daily and she reports now being at baseline and responding well with this therapy. Continue Lasix  40 mg daily.  PCP is checking routine labs today. In the future may consider SGLT2 inhibitor  PAF Overall stable disease, seems to be maintaining sinus rhythm. Flecainide  stopped given new peripheral edema.  She was recently started on Toprol -XL 75 mg twice daily and tolerating well.  Compliant with her Eliquis  5 mg twice daily  OSA on CPAP Compliant  Hypertension She brought in a blood pressure log on her phone.  Overall  decent control today in   BP 120/60.  I have encouraged her to continue to log her blood pressure.  For now I think we can hold off on additional therapy. Continue with Toprol -XL as above  Hyperlipidemia LDL in October 2024 was 144.  LDL is being checked today by her PCP.  Further management per them.  Murmur Her echocardiogram in October 2023 had evidence of aortic sclerosis.  Soft murmur appreciated today at left sternal border which is likely this.  No need for  repeat echocardiogram at this time unless this becomes more pronounced.  Asymptomatic.      Dispo: Follow-up with Dr. Marven Slimmer in August  Signed, Burnetta Cart, PA-C

## 2023-12-20 NOTE — Assessment & Plan Note (Addendum)
 Rebecca Brooks

## 2023-12-21 ENCOUNTER — Ambulatory Visit: Payer: Self-pay | Admitting: Family Medicine

## 2023-12-21 NOTE — Telephone Encounter (Signed)
 Copied from CRM [id number]. Topic: Clinical - Lab/Test Results >> Dec 21, 2023 10:23 AM Rebecca Brooks wrote: Reason for CRM: Pt called to go over lab results- pt understood.

## 2023-12-24 ENCOUNTER — Other Ambulatory Visit: Payer: Self-pay | Admitting: Family Medicine

## 2023-12-24 ENCOUNTER — Other Ambulatory Visit: Payer: Self-pay | Admitting: Cardiovascular Disease

## 2023-12-24 DIAGNOSIS — G2581 Restless legs syndrome: Secondary | ICD-10-CM

## 2023-12-27 ENCOUNTER — Other Ambulatory Visit: Payer: Self-pay | Admitting: Family Medicine

## 2023-12-27 DIAGNOSIS — R601 Generalized edema: Secondary | ICD-10-CM

## 2024-01-08 ENCOUNTER — Encounter: Payer: Self-pay | Admitting: Family Medicine

## 2024-01-08 ENCOUNTER — Ambulatory Visit (HOSPITAL_BASED_OUTPATIENT_CLINIC_OR_DEPARTMENT_OTHER): Admitting: Radiology

## 2024-01-08 MED ORDER — TRAMADOL HCL 50 MG PO TABS: 50.0000 mg | ORAL_TABLET | Freq: Four times a day (QID) | ORAL | 0 refills | Status: DC | PRN

## 2024-01-14 ENCOUNTER — Ambulatory Visit (HOSPITAL_BASED_OUTPATIENT_CLINIC_OR_DEPARTMENT_OTHER): Admitting: Radiology

## 2024-01-15 ENCOUNTER — Ambulatory Visit (HOSPITAL_BASED_OUTPATIENT_CLINIC_OR_DEPARTMENT_OTHER): Admitting: Radiology

## 2024-01-22 ENCOUNTER — Ambulatory Visit

## 2024-02-01 ENCOUNTER — Encounter: Payer: Self-pay | Admitting: Family

## 2024-02-01 ENCOUNTER — Other Ambulatory Visit (HOSPITAL_BASED_OUTPATIENT_CLINIC_OR_DEPARTMENT_OTHER): Payer: Self-pay

## 2024-02-01 MED ORDER — ZOSTER VAC RECOMB ADJUVANTED 50 MCG/0.5ML IM SUSR
0.5000 mL | Freq: Once | INTRAMUSCULAR | 0 refills | Status: AC
  Filled 2024-02-01: qty 0.5, 1d supply, fill #0

## 2024-02-01 MED ORDER — DICLOFENAC SODIUM 3 % EX GEL
1.0000 | Freq: Three times a day (TID) | CUTANEOUS | 1 refills | Status: DC | PRN
  Filled 2024-06-24: qty 100, 30d supply, fill #0

## 2024-02-01 MED ORDER — FLECAINIDE ACETATE 50 MG PO TABS: 50.0000 mg | ORAL_TABLET | Freq: Two times a day (BID) | ORAL | 3 refills | Status: DC

## 2024-02-02 ENCOUNTER — Other Ambulatory Visit (HOSPITAL_BASED_OUTPATIENT_CLINIC_OR_DEPARTMENT_OTHER): Payer: Self-pay

## 2024-02-02 ENCOUNTER — Other Ambulatory Visit: Payer: Self-pay | Admitting: Family Medicine

## 2024-02-02 DIAGNOSIS — G2581 Restless legs syndrome: Secondary | ICD-10-CM

## 2024-02-04 ENCOUNTER — Other Ambulatory Visit (HOSPITAL_BASED_OUTPATIENT_CLINIC_OR_DEPARTMENT_OTHER): Payer: Self-pay

## 2024-02-04 MED ORDER — ROPINIROLE HCL 2 MG PO TABS
2.0000 mg | ORAL_TABLET | Freq: Every day | ORAL | 5 refills | Status: DC
  Filled 2024-02-04: qty 30, 30d supply, fill #0
  Filled 2024-03-06: qty 30, 30d supply, fill #1
  Filled 2024-04-14: qty 30, 30d supply, fill #2
  Filled 2024-05-15 – 2024-05-16 (×2): qty 30, 30d supply, fill #3
  Filled 2024-06-07 – 2024-06-10 (×2): qty 30, 30d supply, fill #4
  Filled 2024-07-15: qty 30, 30d supply, fill #5

## 2024-02-07 ENCOUNTER — Telehealth: Payer: Self-pay | Admitting: Cardiovascular Disease

## 2024-02-07 ENCOUNTER — Other Ambulatory Visit (HOSPITAL_COMMUNITY): Payer: Self-pay

## 2024-02-07 ENCOUNTER — Ambulatory Visit

## 2024-02-07 ENCOUNTER — Other Ambulatory Visit (HOSPITAL_BASED_OUTPATIENT_CLINIC_OR_DEPARTMENT_OTHER): Payer: Self-pay

## 2024-02-07 DIAGNOSIS — I480 Paroxysmal atrial fibrillation: Secondary | ICD-10-CM

## 2024-02-07 MED ORDER — APIXABAN 5 MG PO TABS
5.0000 mg | ORAL_TABLET | Freq: Two times a day (BID) | ORAL | 1 refills | Status: DC
  Filled 2024-02-07 – 2024-02-12 (×2): qty 180, 90d supply, fill #0
  Filled 2024-05-15: qty 180, 90d supply, fill #1
  Filled 2024-05-16: qty 60, 30d supply, fill #1
  Filled 2024-06-07: qty 60, 30d supply, fill #2
  Filled 2024-06-10: qty 60, 30d supply, fill #0
  Filled 2024-07-15: qty 60, 30d supply, fill #1

## 2024-02-07 NOTE — Telephone Encounter (Signed)
 Prescription refill request for Eliquis  received. Indication: Afib  Last office visit:12/20/23 Kai)  Scr: 0.74 (12/20/23)  Age: 73 Weight: 89.4kg  Appropriate dose. Refill sent.

## 2024-02-07 NOTE — Telephone Encounter (Signed)
*  STAT* If patient is at the pharmacy, call can be transferred to refill team.   1. Which medications need to be refilled? (please list name of each medication and dose if known) apixaban  (ELIQUIS ) 5 MG TABS tablet    2. Would you like to learn more about the convenience, safety, & potential cost savings by using the [facility]?     3. Are you open to using the [facility] (Type [facility]. ).   4. Which pharmacy/location (including street and city if local pharmacy) is medication to be sent to? [facility]    5. Do they need a 30 day or 90 day supply? 90 day

## 2024-02-12 ENCOUNTER — Other Ambulatory Visit (HOSPITAL_BASED_OUTPATIENT_CLINIC_OR_DEPARTMENT_OTHER): Payer: Self-pay

## 2024-02-12 ENCOUNTER — Other Ambulatory Visit (HOSPITAL_COMMUNITY): Payer: Self-pay

## 2024-02-13 ENCOUNTER — Ambulatory Visit (INDEPENDENT_AMBULATORY_CARE_PROVIDER_SITE_OTHER)

## 2024-02-13 VITALS — BP 122/75 | HR 71 | Ht 60.75 in | Wt 195.0 lb

## 2024-02-13 DIAGNOSIS — Z0000 Encounter for general adult medical examination without abnormal findings: Secondary | ICD-10-CM | POA: Diagnosis not present

## 2024-02-13 NOTE — Progress Notes (Signed)
 Because this visit was a virtual/telehealth visit,  certain criteria was not obtained, such a blood pressure, CBG if applicable, and timed get up and go. Any medications not marked as taking were not mentioned during the medication reconciliation part of the visit. Any vitals not documented were not able to be obtained due to this being a telehealth visit or patient was unable to self-report a recent blood pressure reading due to a lack of equipment at home via telehealth. Vitals that have been documented are verbally provided by the patient.   This visit was performed by a medical professional under my direct supervision. I was immediately available for consultation/collaboration. I have reviewed and agree with the Annual Wellness Visit documentation.  Subjective:   Rebecca Brooks is a 73 y.o. who presents for a Medicare Wellness preventive visit.  As a reminder, Annual Wellness Visits don't include a physical exam, and some assessments may be limited, especially if this visit is performed virtually. We may recommend an in-person follow-up visit with your provider if needed.  Visit Complete: Virtual I connected with  Rebecca Brooks on 02/13/24 by a audio enabled telemedicine application and verified that I am speaking with the correct person using two identifiers.  Patient Location: Home  Provider Location: Home Office  I discussed the limitations of evaluation and management by telemedicine. The patient expressed understanding and agreed to proceed.  Vital Signs: Because this visit was a virtual/telehealth visit, some criteria may be missing or patient reported. Any vitals not documented were not able to be obtained and vitals that have been documented are patient reported.  VideoDeclined- This patient declined Librarian, academic. Therefore the visit was completed with audio only.  Persons Participating in Visit: Patient.  AWV Questionnaire: No:  Patient Medicare AWV questionnaire was not completed prior to this visit.  Cardiac Risk Factors include: advanced age (>62men, >82 women);hypertension;Other (see comment);obesity (BMI >30kg/m2), Risk factor comments: afib     Objective:    Today's Vitals   02/13/24 0938 02/13/24 0940  BP: 122/75   Pulse: 71   Weight: 195 lb (88.5 kg)   Height: 5' 0.75 (1.543 m)   PainSc:  5    Body mass index is 37.15 kg/m.     02/13/2024    9:37 AM 06/18/2023    3:07 PM 06/07/2023   11:07 AM 01/22/2023    8:57 AM 01/10/2023   11:04 AM 09/13/2022   10:21 AM 05/04/2022    5:32 PM  Advanced Directives  Does Patient Have a Medical Advance Directive? Yes Yes Yes Yes Yes Yes No  Type of Estate agent of [place];Living will Healthcare Power of [employer] Power of [employer] Power of [place];Living will Healthcare Power of [place];Living will Healthcare Power of [place];Living will   Does patient want to make changes to medical advance directive? No - Patient declined No - Patient declined  No - Patient declined  No - Patient declined   Copy of Healthcare Power of Attorney in Chart? No - copy requested No - copy requested No - copy requested No - copy requested  No - copy requested     Current Medications (verified) Outpatient Encounter Medications as of 02/13/2024  Medication Sig   acetaminophen  (TYLENOL ) 650 MG CR tablet Take 1,300 mg by mouth 2 (two) times daily.   albuterol  (VENTOLIN  HFA) 108 (90 Base) MCG/ACT inhaler Inhale 1-2 puffs into the lungs every 6 (six) hours as needed for wheezing or shortness of  breath.   apixaban  (ELIQUIS ) 5 MG TABS tablet Take 1 tablet (5 mg total) by mouth 2 (two) times daily.   benzonatate  (TESSALON ) 200 MG capsule Take 1 capsule (200 mg total) by mouth 3 (three) times daily as needed for cough. (Patient taking differently: Take 200 mg by mouth as needed for cough.)   cetirizine  (ZYRTEC ) 10 MG tablet Take 1 tablet (10 mg  total) by mouth daily.   Diclofenac  Sodium 3 % GEL Apply 1 Application topically 3 (three) times daily as needed.   famotidine  (PEPCID ) 20 MG tablet TAKE 1 TABLET(20 MG) BY MOUTH AT BEDTIME   flecainide  (TAMBOCOR ) 50 MG tablet Take 1 tablet (50 mg total) by mouth 2 (two) times daily.   fluticasone  (FLONASE ) 50 MCG/ACT nasal spray SHAKE LIQUID AND USE 2 SPRAYS IN EACH NOSTRIL DAILY   furosemide  (LASIX ) 40 MG tablet Take 1 tablet (40 mg total) by mouth daily.   levothyroxine  (SYNTHROID ) 100 MCG tablet Take 1 tablet (100 mcg total) by mouth daily before breakfast.   metoprolol  succinate (TOPROL -XL) 50 MG 24 hr tablet Take 1.5 tablets (75 mg total) by mouth in the morning and at bedtime. Take with or immediately following a meal.   ondansetron  (ZOFRAN ) 4 MG tablet Take 1 tablet (4 mg total) by mouth every 6 (six) hours as needed for nausea. (Patient taking differently: Take 4 mg by mouth as needed for nausea.)   pantoprazole  (PROTONIX ) 40 MG tablet Take 1 tablet (40 mg total) by mouth daily.   polyethylene glycol (MIRALAX  / GLYCOLAX ) 17 g packet Take 17 g by mouth daily as needed for moderate constipation.   rOPINIRole  (REQUIP ) 2 MG tablet Take 1 tablet (2 mg total) by mouth daily.   traMADol  (ULTRAM ) 50 MG tablet Take 1-2 tablets (50-100 mg total) by mouth every 6 (six) hours as needed for moderate pain (pain score 4-6).   Vitamin D3 (VITAMIN D ) 25 MCG tablet Take 1 tablet (1,000 Units total) by mouth daily.   No facility-administered encounter medications on file as of 02/13/2024.    Allergies (verified) Bee venom, Accupril [quinapril hcl], Quinapril-hydrochlorothiazide , Simvastatin , and Rosuvastatin    History: Past Medical History:  Diagnosis Date   Abnormal nuclear stress test    False positive. Cath 08/23/15 showed Angiographically normal coronary arteries   Anemia    Ankle fracture    Arthritis    Asthma    asthmatic bronchitis   Atrial fibrillation (HCC)    Back pain    Chicken  pox as a child   Edema of both lower extremities    Essential hypertension 07/17/2011   ACEI d/c 06/2011 for refractory cough> resolved    GERD (gastroesophageal reflux disease)    Hyperlipidemia    Hypertension    Hypothyroidism    Insomnia 11/01/2014   Insulin  resistance    pt reports she was told this in the past, does not take any medication for this   Joint pain    Low back pain radiating to right leg    intermittent and occasional numbness of skin over right hip in certain positions   Measles as a child   Mumps as a child   Obesity    Osteoarthritis    Palpitation    Pneumonia    this October 2013 from which she has said inhailer   PONV (postoperative nausea and vomiting)    violent vomiting after surgery   Recurrent epistaxis 02/20/2012   RLS (restless legs syndrome) 11/01/2014   Skin cancer  Sleep apnea    cpap   SOB (shortness of breath) on exertion    Spinal stenosis    Vitamin B 12 deficiency    Vitamin D  deficiency    Past Surgical History:  Procedure Laterality Date   CARDIAC CATHETERIZATION N/A 08/23/2015   Procedure: Left Heart Cath and Coronary Angiography;  Surgeon: Ozell Fell, MD;  Location: [facility] INVASIVE CV LAB;  Service: Cardiovascular;  Laterality: N/A;   CATARACT EXTRACTION Bilateral    CESAREAN SECTION     X 3   GASTRECTOMY  12/2017   INGUINAL HERNIA REPAIR Right 73 yrs old   LUMBAR LAMINECTOMY/DECOMPRESSION MICRODISCECTOMY Bilateral 10/21/2021   Procedure: Laminectomy and Foraminotomy - bilateral - Lumbar Three-Lumbar Four - Lumbar Four- Lumbar Five;  Surgeon: Louis Shove, MD;  Location: [facility] OR;  Service: Neurosurgery;  Laterality: Bilateral;   THYROIDECTOMY     total for benign tumor, Parathyroid spared   TONSILLECTOMY     TOTAL HIP ARTHROPLASTY Right 2006   secondary to congenital  hip defect   TOTAL KNEE ARTHROPLASTY Left 01/22/2023   Procedure: LEFT TOTAL KNEE ARTHROPLASTY;  Surgeon: Melodi Lerner, MD;  Location: [facility] ORS;  Service:  Orthopedics;  Laterality: Left;   TOTAL KNEE ARTHROPLASTY Right 06/18/2023   Procedure: TOTAL KNEE ARTHROPLASTY;  Surgeon: Melodi Lerner, MD;  Location: [facility];  Service: Orthopedics;  Laterality: Right;   Family History  Problem Relation Age of Onset   Cancer Mother    Heart failure Mother    Lung cancer Mother 24       smoker   Heart Problems Mother        tachycardia   Hearing loss Mother    Anxiety disorder Mother        anxiety, claustrophobia   Obesity Mother    Osteoarthritis Father    Hyperlipidemia Father    Anuerysm Father        AAA   Sudden death Father    Hyperlipidemia Sister    Migraines Sister    Hypertension Sister    Heart Problems Sister        tachycardia   Colon cancer Maternal Grandmother    Heart attack Maternal Grandfather    Aneurysm Paternal Grandfather        abdominal   Heart disease Paternal Grandfather        AAA rupture, smoker   Social History   Socioeconomic History   Marital status: Married    Spouse name: Not on file   Number of children: 3   Years of education: Not on file   Highest education level: Not on file  Occupational History   Occupation: Tree surgeon   Occupation: Careers information officer   Occupation: Retired Education administrator  Tobacco Use   Smoking status: Former    Current packs/day: 0.00    Average packs/day: 0.5 packs/day for 5.0 years (2.5 ttl pk-yrs)    Types: Cigarettes    Start date: 07/24/1966    Quit date: 07/25/1971    Years since quitting: 52.5    Passive exposure: Past   Smokeless tobacco: Never   Tobacco comments:    Former smoker 06/06/23  Vaping Use   Vaping status: Never Used  Substance and Sexual Activity   Alcohol use: Yes    Alcohol/week: 1.0 standard drink of alcohol    Types: 1 Standard drinks or equivalent per week    Comment: vodka/gin something clear 06/06/23   Drug use: No   Sexual activity: Never    Comment: lives with husband,  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  Other Topics Concern   Not on file  Social History  Narrative   Not on file   Social Drivers of Health   Financial Resource Strain: Low Risk  (02/13/2024)   Overall Financial Resource Strain (CARDIA)    Difficulty of Paying Living Expenses: Not very hard  Food Insecurity: No Food Insecurity (02/13/2024)   Hunger Vital Sign    Worried About Running Out of Food in the Last Year: Never true    Ran Out of Food in the Last Year: Never true  Transportation Needs: No Transportation Needs (02/13/2024)   PRAPARE - Administrator, Civil Service (Medical): No    Lack of Transportation (Non-Medical): No  Physical Activity: Inactive (02/13/2024)   Exercise Vital Sign    Days of Exercise per Week: 0 days    Minutes of Exercise per Session: 0 min  Stress: No Stress Concern Present (02/13/2024)   [employer] of Occupational Health - Occupational Stress Questionnaire    Feeling of Stress: Only a little  Social Connections: Socially Integrated (02/13/2024)   Social Connection and Isolation Panel    Frequency of Communication with Friends and Family: Twice a week    Frequency of Social Gatherings with Friends and Family: Once a week    Attends Religious Services: More than 4 times per year    Active Member of [employer] or Organizations: Yes    Attends Engineer, structural: More than 4 times per year    Marital Status: Married    Tobacco Counseling Counseling given: Not Answered Tobacco comments: Former smoker 06/06/23    Clinical Intake:  Pre-visit preparation completed: Yes  Pain : 0-10 Pain Score: 5  Pain Type: Chronic pain Pain Location: Shoulder Pain Orientation: Right Pain Descriptors / Indicators: Aching Pain Onset: Today Pain Relieving Factors: patient receive an injection  Pain Relieving Factors: patient receive an injection  BMI - recorded: 37.15 Nutritional Status: BMI > 30  Obese Nutritional Risks: None Diabetes: No  Lab Results  Component Value Date   HGBA1C 5.1 12/20/2023   HGBA1C 5.3 05/01/2023    HGBA1C 5.4 10/31/2022     How often do you need to have someone help you when you read instructions, pamphlets, or other written materials from your doctor or pharmacy?: 1 - Never  Interpreter Needed?: No  Information entered by :: Lyle Anderson LATHER   Activities of Daily Living     02/13/2024    9:44 AM 06/18/2023    3:07 PM  In your present state of health, do you have any difficulty performing the following activities:  Hearing? 0   Vision? 0   Difficulty concentrating or making decisions? 0   Walking or climbing stairs? 0   Dressing or bathing? 1   Comment shoulder pain   Doing errands, shopping? 0 0  Preparing Food and eating ? N   Using the Toilet? N   In the past six months, have you accidently leaked urine? N   Do you have problems with loss of bowel control? N   Managing your Medications? N   Managing your Finances? N   Housekeeping or managing your Housekeeping? N     Patient Care Team: Domenica Harlene LABOR, MD as PCP - General (Family Medicine) O'Neal, Darryle Ned, MD as PCP - Cardiology (Cardiology) Cindie Ole DASEN, MD as PCP - Electrophysiology (Cardiology)  I have updated your Care Teams any recent Medical Services you may have received from other providers in  the past year.     Assessment:   This is a routine wellness examination for Rebecca Brooks.  Hearing/Vision screen Hearing Screening - Comments:: No difficulties Vision Screening - Comments:: Patient wears glasses,   Goals Addressed             This Visit's Progress    Patient Stated       Patient wants to work on mobility       Depression Screen     02/13/2024    9:47 AM 12/06/2023   11:45 AM 10/31/2022   10:21 AM 09/13/2022   10:26 AM 03/28/2022    4:01 PM 08/29/2021   10:37 AM 01/26/2021    8:42 AM  PHQ 2/9 Scores  PHQ - 2 Score 2 0 0 0 0 0 3  PHQ- 9 Score 2  0  0  10    Fall Risk     02/13/2024    9:42 AM 10/31/2022   10:21 AM 09/13/2022    9:19 AM 03/28/2022    4:01 PM 08/29/2021    10:32 AM  Fall Risk   Falls in the past year? 1 1 1 1 1   Number falls in past yr: 0 0 0 0 0  Injury with Fall? 0 0 0 0 0  Risk for fall due to : Impaired mobility  History of fall(s)  History of fall(s)  Follow up Falls evaluation completed Falls evaluation completed Falls evaluation completed  Falls prevention discussed      Data saved with a previous flowsheet row definition    MEDICARE RISK AT HOME:  Medicare Risk at Home Any stairs in or around the home?: No If so, are there any without handrails?: No Home free of loose throw rugs in walkways, pet beds, electrical cords, etc?: Yes Adequate lighting in your home to reduce risk of falls?: Yes Life alert?: No Use of a cane, walker or w/c?: No Grab bars in the bathroom?: No Shower chair or bench in shower?: No Elevated toilet seat or a handicapped toilet?: No  TIMED UP AND GO:  Was the test performed?  No  Cognitive Function: 6CIT completed        02/13/2024    9:48 AM 09/13/2022   10:32 AM  6CIT Screen  What Year? 0 points 0 points  What month? 0 points 0 points  What time? 0 points 0 points  Count back from 20 0 points 0 points  Months in reverse 0 points 0 points  Repeat phrase 0 points 0 points  Total Score 0 points 0 points    Immunizations Immunization History  Administered Date(s) Administered   Fluad Quad(high Dose 65+) 04/19/2019, 06/27/2022   Fluad Trivalent(High Dose 65+) 05/01/2023   Fluzone Influenza virus vaccine,trivalent (IIV3), split virus 07/11/2011   Influenza Split 07/19/2012   Influenza Whole 06/24/2011, 05/13/2013   Influenza, High Dose Seasonal PF 06/22/2017, 05/14/2018   Influenza,inj,Quad PF,6+ Mos 06/03/2016   Influenza-Unspecified 05/07/2014, 06/08/2015   PFIZER(Purple Top)SARS-COV-2 Vaccination 08/12/2019, 09/03/2019, 06/05/2020   Pneumococcal Conjugate-13 05/14/2018   Pneumococcal Polysaccharide-23 07/18/2011   Td 07/24/2005   Tdap 08/01/2011   Zoster Recombinant(Shingrix )  02/01/2024   Zoster, Live 07/23/2012    Screening Tests Health Maintenance  Topic Date Due   Hepatitis C Screening  Never done   DTaP/Tdap/Td (3 - Td or Tdap) 07/31/2021   COVID-19 Vaccine (4 - 2024-25 season) 03/25/2023   Pneumococcal Vaccine: 50+ Years (3 of 3 - PCV20 or PCV21) 05/15/2023   INFLUENZA VACCINE  02/22/2024   Zoster Vaccines- Shingrix  (2 of 2) 03/28/2024   MAMMOGRAM  09/19/2024   Medicare Annual Wellness (AWV)  02/12/2025   Fecal DNA (Cologuard)  04/30/2026   DEXA SCAN  Completed   Hepatitis B Vaccines  Aged Out   HPV VACCINES  Aged Out   Meningococcal B Vaccine  Aged Out   Colonoscopy  Discontinued    Health Maintenance  Health Maintenance Due  Topic Date Due   Hepatitis C Screening  Never done   DTaP/Tdap/Td (3 - Td or Tdap) 07/31/2021   COVID-19 Vaccine (4 - 2024-25 season) 03/25/2023   Pneumococcal Vaccine: 50+ Years (3 of 3 - PCV20 or PCV21) 05/15/2023   Health Maintenance Items Addressed:patient declined health maintenance  Additional Screening:  Vision Screening: Recommended annual ophthalmology exams for early detection of glaucoma and other disorders of the eye. Would you like a referral to an eye doctor? No    Dental Screening: Recommended annual dental exams for proper oral hygiene  Community Resource Referral / Chronic Care Management: CRR required this visit?  No   CCM required this visit?  No   Plan:    I have personally reviewed and noted the following in the patient's chart:   Medical and social history Use of alcohol, tobacco or illicit drugs  Current medications and supplements including opioid prescriptions. Patient is not currently taking opioid prescriptions. Functional ability and status Nutritional status Physical activity Advanced directives List of other physicians Hospitalizations, surgeries, and ER visits in previous 12 months Vitals Screenings to include cognitive, depression, and falls Referrals and  appointments  In addition, I have reviewed and discussed with patient certain preventive protocols, quality metrics, and best practice recommendations. A written personalized care plan for preventive services as well as general preventive health recommendations were provided to patient.   Lyle MARLA Right, [place]   02/13/2024   After Visit Summary: (MyChart) Due to this being a telephonic visit, the after visit summary with patients personalized plan was offered to patient via MyChart   Notes: Nothing significant to report at this time.

## 2024-02-13 NOTE — Patient Instructions (Signed)
 Rebecca Brooks , Thank you for taking time out of your busy schedule to complete your Annual Wellness Visit with me. I enjoyed our conversation and look forward to speaking with you again next year. I, as well as your care team,  appreciate your ongoing commitment to your health goals. Please review the following plan we discussed and let me know if I can assist you in the future. Your Game plan/ To Do List    Referrals: If you haven't heard from the office you've been referred to, please reach out to them at the phone provided.  none Follow up Visits: Next Medicare AWV with our clinical staff: 02/18/2025   Have you seen your provider in the last 6 months (3 months if uncontrolled diabetes)? No Next Office Visit with your provider: 01/25/2024  Clinician Recommendations:  Aim for 30 minutes of exercise or brisk walking, 6-8 glasses of water , and 5 servings of fruits and vegetables each day.       This is a list of the screening recommended for you and due dates:  Health Maintenance  Topic Date Due   Hepatitis C Screening  Never done   DTaP/Tdap/Td vaccine (3 - Td or Tdap) 07/31/2021   COVID-19 Vaccine (4 - 2024-25 season) 03/25/2023   Pneumococcal Vaccine for age over 51 (3 of 3 - PCV20 or PCV21) 05/15/2023   Flu Shot  02/22/2024   Zoster (Shingles) Vaccine (2 of 2) 03/28/2024   Mammogram  09/19/2024   Medicare Annual Wellness Visit  02/12/2025   Cologuard (Stool DNA test)  04/30/2026   DEXA scan (bone density measurement)  Completed   Hepatitis B Vaccine  Aged Out   HPV Vaccine  Aged Out   Meningitis B Vaccine  Aged Out   Colon Cancer Screening  Discontinued    Advanced directives: (Declined) Advance directive discussed with you today. Even though you declined this today, please call our office should you change your mind, and we can give you the proper paperwork for you to fill out. Advance Care Planning is important because it:  [x]  Makes sure you receive the medical care that is  consistent with your values, goals, and preferences  [x]  It provides guidance to your family and loved ones and reduces their decisional burden about whether or not they are making the right decisions based on your wishes.  Follow the link provided in your after visit summary or read over the paperwork we have mailed to you to help you started getting your Advance Directives in place. If you need assistance in completing these, please reach out to us  so that we can help you!  See attachments for Preventive Care and Fall Prevention Tips.

## 2024-02-21 ENCOUNTER — Other Ambulatory Visit (HOSPITAL_BASED_OUTPATIENT_CLINIC_OR_DEPARTMENT_OTHER): Payer: Self-pay

## 2024-02-22 ENCOUNTER — Other Ambulatory Visit: Payer: Self-pay | Admitting: Family Medicine

## 2024-02-22 MED ORDER — TRAMADOL HCL 50 MG PO TABS
50.0000 mg | ORAL_TABLET | Freq: Four times a day (QID) | ORAL | 0 refills | Status: DC | PRN
  Filled 2024-02-22: qty 90, 12d supply, fill #0

## 2024-02-22 NOTE — Telephone Encounter (Unsigned)
 Copied from CRM [id number]. Topic: Clinical - Medication Refill >> Feb 22, 2024  4:05 PM Alexandria E wrote: Medication: traMADol  (ULTRAM ) 50 MG tablet  *Patient also questioning if she can have a 90-day supply of this medication.   Has the patient contacted their pharmacy? Yes (Agent: If no, request that the patient contact the pharmacy for the refill. If patient does not wish to contact the pharmacy document the reason why and proceed with request.) (Agent: If yes, when and what did the pharmacy advise?)  This is the patient's preferred pharmacy:   [facility] [address] [place] [address] Phone: [phone] Fax: [phone]  Is this the correct pharmacy for this prescription? Yes If no, delete pharmacy and type the correct one.   Has the prescription been filled recently? No  Is the patient out of the medication? No, few days left.  Has the patient been seen for an appointment in the last year OR does the patient have an upcoming appointment? Yes  Can we respond through MyChart? Yes  Agent: Please be advised that Rx refills may take up to 3 business days. We ask that you follow-up with your pharmacy.

## 2024-02-22 NOTE — Telephone Encounter (Signed)
 Requesting: Tramadol  50 MG Contract: 10/31/22 UDS: 10/31/22 Last Visit: 12/20/2023 Next Visit: 06/26/2024 Last Refill: 01/05/24  Please Advise

## 2024-02-23 ENCOUNTER — Other Ambulatory Visit (HOSPITAL_BASED_OUTPATIENT_CLINIC_OR_DEPARTMENT_OTHER): Payer: Self-pay

## 2024-02-28 ENCOUNTER — Ambulatory Visit: Admitting: Cardiology

## 2024-03-04 ENCOUNTER — Ambulatory Visit (HOSPITAL_BASED_OUTPATIENT_CLINIC_OR_DEPARTMENT_OTHER): Admission: RE | Admit: 2024-03-04 | Source: Ambulatory Visit | Admitting: Radiology

## 2024-03-06 ENCOUNTER — Ambulatory Visit: Admitting: Cardiology

## 2024-03-13 ENCOUNTER — Ambulatory Visit (HOSPITAL_BASED_OUTPATIENT_CLINIC_OR_DEPARTMENT_OTHER): Attending: Family Medicine | Admitting: Physical Therapy

## 2024-03-13 ENCOUNTER — Encounter (HOSPITAL_BASED_OUTPATIENT_CLINIC_OR_DEPARTMENT_OTHER): Payer: Self-pay | Admitting: Physical Therapy

## 2024-03-13 ENCOUNTER — Other Ambulatory Visit: Payer: Self-pay

## 2024-03-13 DIAGNOSIS — R2689 Other abnormalities of gait and mobility: Secondary | ICD-10-CM | POA: Insufficient documentation

## 2024-03-13 DIAGNOSIS — M6281 Muscle weakness (generalized): Secondary | ICD-10-CM | POA: Insufficient documentation

## 2024-03-13 DIAGNOSIS — M5459 Other low back pain: Secondary | ICD-10-CM | POA: Diagnosis not present
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
## 2024-03-26 ENCOUNTER — Other Ambulatory Visit: Payer: Self-pay | Admitting: Family Medicine

## 2024-03-27 ENCOUNTER — Encounter: Payer: Self-pay | Admitting: Family Medicine

## 2024-03-27 ENCOUNTER — Other Ambulatory Visit (HOSPITAL_BASED_OUTPATIENT_CLINIC_OR_DEPARTMENT_OTHER): Payer: Self-pay

## 2024-03-27 ENCOUNTER — Ambulatory Visit (HOSPITAL_BASED_OUTPATIENT_CLINIC_OR_DEPARTMENT_OTHER): Payer: Self-pay | Attending: Family Medicine | Admitting: Physical Therapy

## 2024-03-27 DIAGNOSIS — Z96653 Presence of artificial knee joint, bilateral: Secondary | ICD-10-CM | POA: Insufficient documentation

## 2024-03-27 DIAGNOSIS — M19012 Primary osteoarthritis, left shoulder: Secondary | ICD-10-CM | POA: Insufficient documentation

## 2024-03-27 DIAGNOSIS — F5101 Primary insomnia: Secondary | ICD-10-CM | POA: Insufficient documentation

## 2024-03-27 DIAGNOSIS — Z96641 Presence of right artificial hip joint: Secondary | ICD-10-CM | POA: Diagnosis not present

## 2024-03-27 DIAGNOSIS — Z862 Personal history of diseases of the blood and blood-forming organs and certain disorders involving the immune mechanism: Secondary | ICD-10-CM | POA: Insufficient documentation

## 2024-03-27 DIAGNOSIS — M5030 Other cervical disc degeneration, unspecified cervical region: Secondary | ICD-10-CM | POA: Insufficient documentation

## 2024-03-27 DIAGNOSIS — M5459 Other low back pain: Secondary | ICD-10-CM | POA: Insufficient documentation

## 2024-03-27 DIAGNOSIS — M19041 Primary osteoarthritis, right hand: Secondary | ICD-10-CM | POA: Insufficient documentation

## 2024-03-27 DIAGNOSIS — M19011 Primary osteoarthritis, right shoulder: Secondary | ICD-10-CM | POA: Diagnosis not present

## 2024-03-27 DIAGNOSIS — Z7901 Long term (current) use of anticoagulants: Secondary | ICD-10-CM | POA: Insufficient documentation

## 2024-03-27 DIAGNOSIS — Z8639 Personal history of other endocrine, nutritional and metabolic disease: Secondary | ICD-10-CM | POA: Insufficient documentation

## 2024-03-27 DIAGNOSIS — Z6837 Body mass index (BMI) 37.0-37.9, adult: Secondary | ICD-10-CM | POA: Diagnosis not present

## 2024-03-27 DIAGNOSIS — S92125S Nondisplaced fracture of body of left talus, sequela: Secondary | ICD-10-CM | POA: Insufficient documentation

## 2024-03-27 DIAGNOSIS — M47816 Spondylosis without myelopathy or radiculopathy, lumbar region: Secondary | ICD-10-CM | POA: Insufficient documentation

## 2024-03-27 DIAGNOSIS — R600 Localized edema: Secondary | ICD-10-CM | POA: Diagnosis not present

## 2024-03-27 DIAGNOSIS — Z8679 Personal history of other diseases of the circulatory system: Secondary | ICD-10-CM | POA: Insufficient documentation

## 2024-03-27 DIAGNOSIS — M19042 Primary osteoarthritis, left hand: Secondary | ICD-10-CM | POA: Insufficient documentation

## 2024-03-27 DIAGNOSIS — M8589 Other specified disorders of bone density and structure, multiple sites: Secondary | ICD-10-CM | POA: Insufficient documentation

## 2024-03-27 DIAGNOSIS — R2689 Other abnormalities of gait and mobility: Secondary | ICD-10-CM | POA: Insufficient documentation

## 2024-03-27 DIAGNOSIS — I480 Paroxysmal atrial fibrillation: Secondary | ICD-10-CM | POA: Diagnosis not present

## 2024-03-27 DIAGNOSIS — M6281 Muscle weakness (generalized): Secondary | ICD-10-CM | POA: Insufficient documentation

## 2024-03-27 DIAGNOSIS — J4489 Other specified chronic obstructive pulmonary disease: Secondary | ICD-10-CM | POA: Diagnosis not present

## 2024-03-27 DIAGNOSIS — G8929 Other chronic pain: Secondary | ICD-10-CM | POA: Diagnosis not present

## 2024-03-27 DIAGNOSIS — M25511 Pain in right shoulder: Secondary | ICD-10-CM | POA: Diagnosis not present

## 2024-03-27 MED ORDER — FLUZONE HIGH-DOSE 0.5 ML IM SUSY
0.5000 mL | PREFILLED_SYRINGE | Freq: Once | INTRAMUSCULAR | 0 refills | Status: AC
Start: 2024-03-27 — End: 2024-03-28
  Filled 2024-03-27: qty 0.5, 1d supply, fill #0

## 2024-03-27 MED ORDER — BOOSTRIX 5-2.5-18.5 LF-MCG/0.5 IM SUSY
0.5000 mL | PREFILLED_SYRINGE | Freq: Once | INTRAMUSCULAR | 0 refills | Status: AC
  Filled 2024-03-27: qty 0.5, 1d supply, fill #0

## 2024-03-27 MED FILL — Levothyroxine Sodium Tab 100 MCG: ORAL | 90 days supply | Qty: 90 | Fill #0 | Status: CN

## 2024-03-27 NOTE — Progress Notes (Signed)
 Office Visit Note  Patient: Rebecca Brooks             Date of Birth: October 15, 1950           MRN: [id number]             PCP: Domenica Harlene LABOR, MD Referring: Domenica Harlene LABOR, MD Visit Date: 04/08/2024 Occupation: @GUAROCC @  Subjective:  Pain in multiple joints  History of Present Illness: Rebecca Brooks is a 73 y.o. female with osteoarthritis and degenerative disc disease.  She returns today after her last visit in September 2024.  She states she underwent right total knee replacement on June 17, 2024 by Dr. Hiram.  She had been going to physical therapy.  She states her knee joint replacements are doing well.  She still has some trouble with her right hip replacement.  She has been going for core strengthening and hip joint muscle strengthening.  She continues to have discomfort in her right shoulder joint.  She was evaluated by Dr. Milissa at [facility] in the past.  She had the last cortisone injection in March 2025.  She states she continues to have increased pain and discomfort in her hands.  She has not got the [facility] brace.  Denies any discomfort in her neck today.  She states she has intermittent discomfort in her lower back.    Activities of Daily Living:  Patient reports morning stiffness for 10-15 minutes.   Patient Reports nocturnal pain.  Difficulty dressing/grooming: Reports Difficulty climbing stairs: Denies Difficulty getting out of chair: Denies Difficulty using hands for taps, buttons, cutlery, and/or writing: Reports  Review of Systems  Constitutional:  Negative for fatigue.  HENT:  Positive for mouth dryness. Negative for mouth sores.   Eyes:  Negative for dryness.  Respiratory:  Negative for shortness of breath.   Cardiovascular:  Negative for chest pain and palpitations.  Gastrointestinal:  Negative for blood in stool, constipation and diarrhea.  Endocrine: Negative for increased urination.  Genitourinary:  Positive for involuntary urination.   Musculoskeletal:  Positive for joint pain, joint pain, joint swelling, muscle weakness and morning stiffness. Negative for gait problem, myalgias, muscle tenderness and myalgias.  Skin:  Positive for color change. Negative for rash, hair loss and sensitivity to sunlight.  Allergic/Immunologic: Negative for susceptible to infections.  Neurological:  Negative for dizziness and headaches.  Hematological:  Negative for swollen glands.  Psychiatric/Behavioral:  Negative for depressed mood and sleep disturbance. The patient is not nervous/anxious.     PMFS History:  Patient Active Problem List   Diagnosis Date Noted   Primary osteoarthritis of right knee 06/18/2023   Paroxysmal atrial fibrillation (HCC) 06/06/2023   Hypercoagulable state due to paroxysmal atrial fibrillation (HCC) 06/06/2023   OA (osteoarthritis) of knee 01/22/2023   Primary osteoarthritis of left knee 01/22/2023   Osteopenia 03/29/2022   Gout 03/29/2022   Murmur, cardiac 03/29/2022   Foot pain 03/29/2022   Lumbar stenosis with neurogenic claudication 10/21/2021   Depression 02/23/2021   Hyperglycemia 02/16/2020   History of COVID-19 10/16/2018   Atrophic vaginitis 05/14/2018   OSA (obstructive sleep apnea) 04/26/2018   Muscle cramps 01/02/2018   IDA (iron deficiency anemia) 12/22/2016   Primary osteoarthritis of both knees 10/03/2016   DDD (degenerative disc disease), cervical 10/03/2016   DDD (degenerative disc disease), lumbar 10/03/2016   History of total hip replacement, right 10/03/2016   History of anemia 10/03/2016   Chondromalacia 10/03/2016   Primary osteoarthritis of shoulder 10/03/2016  Primary osteoarthritis of both hands 10/03/2016   Abnormal nuclear stress test 08/23/2015   Globus sensation 08/13/2015   Encounter for screening mammogram for malignant neoplasm of breast 08/13/2015   Chest pain at rest    Chest pain with moderate risk of acute coronary syndrome 07/28/2015   Annual physical exam  07/11/2015   RLS (restless legs syndrome) 11/01/2014   Insomnia 11/01/2014   Thoracic back pain 01/12/2014   Allergic rhinitis 11/03/2013   Preventative health care 12/13/2012   Recurrent epistaxis 02/20/2012   Shoulder pain, bilateral 11/01/2011   Arthralgia 10/18/2011   Peripheral edema 09/10/2011   Asthma 08/19/2011   Palpitations 08/01/2011   Anemia 08/01/2011   Hypokalemia 08/01/2011   Elevated WBC count 08/01/2011   GERD (gastroesophageal reflux disease)    Low back pain radiating to right leg    Obesity (BMI 30-39.9) 07/17/2011   Hypothyroidism 07/17/2011   Hyperlipidemia 07/17/2011   Essential hypertension 07/17/2011    Past Medical History:  Diagnosis Date   Abnormal nuclear stress test    False positive. Cath 08/23/15 showed Angiographically normal coronary arteries   Anemia    Ankle fracture    Arthritis    Asthma    asthmatic bronchitis   Atrial fibrillation (HCC)    Back pain    Chicken pox as a child   Edema of both lower extremities    Essential hypertension 07/17/2011   ACEI d/c 06/2011 for refractory cough> resolved    GERD (gastroesophageal reflux disease)    Hyperlipidemia    Hypertension    Hypothyroidism    Insomnia 11/01/2014   Insulin  resistance    pt reports she was told this in the past, does not take any medication for this   Joint pain    Low back pain radiating to right leg    intermittent and occasional numbness of skin over right hip in certain positions   Measles as a child   Mumps as a child   Obesity    Osteoarthritis    Palpitation    Pneumonia    this October 2013 from which she has said inhailer   PONV (postoperative nausea and vomiting)    violent vomiting after surgery   Recurrent epistaxis 02/20/2012   RLS (restless legs syndrome) 11/01/2014   Skin cancer    Sleep apnea    cpap   SOB (shortness of breath) on exertion    Spinal stenosis    Vitamin B 12 deficiency    Vitamin D  deficiency     Family History   Problem Relation Age of Onset   Cancer Mother    Heart failure Mother    Lung cancer Mother 8       smoker   Heart Problems Mother        tachycardia   Hearing loss Mother    Anxiety disorder Mother        anxiety, claustrophobia   Obesity Mother    Osteoarthritis Father    Hyperlipidemia Father    Anuerysm Father        AAA   Sudden death Father    Hyperlipidemia Sister    Migraines Sister    Hypertension Sister    Heart Problems Sister        tachycardia   Colon cancer Maternal Grandmother    Heart attack Maternal Grandfather    Aneurysm Paternal Grandfather        abdominal   Heart disease Paternal Grandfather  AAA rupture, smoker   Past Surgical History:  Procedure Laterality Date   CARDIAC CATHETERIZATION N/A 08/23/2015   Procedure: Left Heart Cath and Coronary Angiography;  Surgeon: Ozell Fell, MD;  Location: [facility] INVASIVE CV LAB;  Service: Cardiovascular;  Laterality: N/A;   CATARACT EXTRACTION Bilateral    CESAREAN SECTION     X 3   GASTRECTOMY  12/2017   INGUINAL HERNIA REPAIR Right 73 yrs old   LUMBAR LAMINECTOMY/DECOMPRESSION MICRODISCECTOMY Bilateral 10/21/2021   Procedure: Laminectomy and Foraminotomy - bilateral - Lumbar Three-Lumbar Four - Lumbar Four- Lumbar Five;  Surgeon: Louis Shove, MD;  Location: [facility] OR;  Service: Neurosurgery;  Laterality: Bilateral;   THYROIDECTOMY     total for benign tumor, Parathyroid spared   TONSILLECTOMY     TOTAL HIP ARTHROPLASTY Right 2006   secondary to congenital  hip defect   TOTAL KNEE ARTHROPLASTY Left 01/22/2023   Procedure: LEFT TOTAL KNEE ARTHROPLASTY;  Surgeon: Melodi Lerner, MD;  Location: [facility] ORS;  Service: Orthopedics;  Laterality: Left;   TOTAL KNEE ARTHROPLASTY Right 06/18/2023   Procedure: TOTAL KNEE ARTHROPLASTY;  Surgeon: Melodi Lerner, MD;  Location: [facility] ORS;  Service: Orthopedics;  Laterality: Right;   Social History   Social History Narrative   Not on file   Immunization History   Administered Date(s) Administered   Fluad Quad(high Dose 65+) 04/19/2019, 06/27/2022   Fluad Trivalent(High Dose 65+) 05/01/2023   Fluzone  Influenza virus vaccine,trivalent (IIV3), split virus 07/11/2011   INFLUENZA, HIGH DOSE SEASONAL PF 06/22/2017, 05/14/2018, 03/27/2024   Influenza Split 07/19/2012   Influenza Whole 06/24/2011, 05/13/2013   Influenza,inj,Quad PF,6+ Mos 06/03/2016   Influenza-Unspecified 05/07/2014, 06/08/2015   PFIZER(Purple Top)SARS-COV-2 Vaccination 08/12/2019, 09/03/2019, 06/05/2020   Pneumococcal Conjugate-13 05/14/2018   Pneumococcal Polysaccharide-23 07/18/2011   Td 07/24/2005   Tdap 08/01/2011, 03/27/2024   Zoster Recombinant(Shingrix ) 02/01/2024   Zoster, Live 07/23/2012     Objective: Vital Signs: Wt 202 lb 3.2 oz (91.7 kg)   BMI 38.52 kg/m    Physical Exam Vitals and nursing note reviewed.  Constitutional:      Appearance: She is well-developed.  HENT:     Head: Normocephalic and atraumatic.  Eyes:     Conjunctiva/sclera: Conjunctivae normal.  Cardiovascular:     Rate and Rhythm: Normal rate and regular rhythm.     Heart sounds: Normal heart sounds.  Pulmonary:     Effort: Pulmonary effort is normal.     Breath sounds: Normal breath sounds.  Abdominal:     General: Bowel sounds are normal.     Palpations: Abdomen is soft.  Musculoskeletal:     Cervical back: Normal range of motion.  Lymphadenopathy:     Cervical: No cervical adenopathy.  Skin:    General: Skin is warm and dry.     Capillary Refill: Capillary refill takes less than 2 seconds.  Neurological:     Mental Status: She is alert and oriented to person, place, and time.  Psychiatric:        Behavior: Behavior normal.      Musculoskeletal Exam: She had limited lateral rotation of the cervical spine without discomfort.  Thoracic kyphosis was noted without any tenderness.  Lumbar spine had limited range of motion without discomfort.  Right shoulder joint abduction and forward  flexion was limited to about 80 degrees.  She had very limited to internal rotation.  Left shoulder joint was in full range of motion.  Elbow joints were in good range of motion.  She had bilateral CMC  PIP and DIP thickening with subluxation of several of the PIP and DIP joints.  She has inflammation in her bilateral ring finger PIP joints.  Hip joints with good range of motion and the right hip joint was replaced.  Both knee joints were replaced and were in good range of motion.  She had no tenderness over her ankles.  She had to have PIP and DIP thickening and bilateral first MTP thickening.  CDAI Exam: CDAI Score: -- Patient Global: --; Provider Global: -- Swollen: --; Tender: -- Joint Exam 04/08/2024   No joint exam has been documented for this visit   There is currently no information documented on the homunculus. Go to the Rheumatology activity and complete the homunculus joint exam.  Investigation: No additional findings.  Imaging: MM 3D SCREENING MAMMOGRAM BILATERAL BREAST Result Date: 04/02/2024 CLINICAL DATA:  Screening. EXAM: DIGITAL SCREENING BILATERAL MAMMOGRAM WITH TOMOSYNTHESIS AND CAD TECHNIQUE: Bilateral screening digital craniocaudal and mediolateral oblique mammograms were obtained. Bilateral screening digital breast tomosynthesis was performed. The images were evaluated with computer-aided detection. COMPARISON:  Previous exam(s). ACR Breast Density Category a: The breasts are almost entirely fatty. FINDINGS: There are no findings suspicious for malignancy. IMPRESSION: No mammographic evidence of malignancy. A result letter of this screening mammogram will be mailed directly to the patient. RECOMMENDATION: Screening mammogram in one year. (Code:SM-B-01Y) BI-RADS CATEGORY  1: Negative. Electronically Signed   By: Curtistine Noble   On: 04/02/2024 14:57    Recent Labs: Lab Results  Component Value Date   WBC 5.7 12/20/2023   HGB 13.0 12/20/2023   PLT 311.0 12/20/2023   NA  140 12/20/2023   K 4.8 12/20/2023   CL 102 12/20/2023   CO2 31 12/20/2023   GLUCOSE 93 12/20/2023   BUN 27 (H) 12/20/2023   CREATININE 0.74 12/20/2023   BILITOT 0.6 12/20/2023   ALKPHOS 83 12/20/2023   AST 14 12/20/2023   ALT 9 12/20/2023   PROT 6.3 12/20/2023   ALBUMIN 4.1 12/20/2023   CALCIUM  9.5 12/20/2023   GFRAA >60 12/21/2017    Speciality Comments: No specialty comments available.  Procedures:  Large Joint Inj: R glenohumeral on 04/08/2024 8:40 AM Indications: pain Details: 27 G 1.5 in needle, posterior approach  Arthrogram: No  Medications: 1.5 mL lidocaine  1 %; 40 mg triamcinolone  acetonide 40 MG/ML Aspirate: 0 mL Outcome: tolerated well, no immediate complications  Risk of infection, tendon injury, nerve injury, dermal atrophy and hypopigmentation were discussed. Procedure, treatment alternatives, risks and benefits explained, specific risks discussed. Consent was given by the patient. Immediately prior to procedure a time out was called to verify the correct patient, procedure, equipment, support staff and site/side marked as required. Patient was prepped and draped in the usual sterile fashion.     Allergies: Bee venom, Accupril [quinapril hcl], Quinapril-hydrochlorothiazide , Simvastatin , and Rosuvastatin    Assessment / Plan:     Visit Diagnoses: Chronic right shoulder pain -she has been experiencing increased pain and discomfort to her right shoulder joint.  She has limited range of motion.  Patient states she has been diagnosed with severe osteoarthritis that was advised total shoulder replacement.  She request cortisone injection today.  She states she is not ready for a replacement yet.  Plan: XR Shoulder Right.  Severe end-stage osteoarthritis of the right shoulder joint was noted.  X-ray findings were reviewed with the patient.  Patient requested a cortisone injection due to ongoing pain and discomfort.  After informed consent was obtained and side effects were  discussed the right shoulder joint was injected as described above.  Patient tolerated the procedure well.  Postprocedure instructions were given.  A handout on exercises was given.  I will refer patient to orthopedics.  Primary osteoarthritis of both shoulders -she was evaluated by Dr. Milissa at [facility] in the past.  She states her last cortisone injection was in March.  Primary osteoarthritis of both hands-she has severe osteoarthritis in bilateral hands with DIP and PIP thickening and subluxation.  She had bilateral CMC thickening.  Joint protection muscle strengthening was discussed.  She wants to have bilateral ring finger PIP cortisone injections.  I will schedule ultrasound-guided injection per patient's request.  A handout on muscle strength exercises was given.  History of total hip replacement, right-she had good range of motion without discomfort.  Status post total knee replacement, bilateral - LTKR January 22, 2023, RTKR 06/17/2024 by Dr. Hiram.  She had good recovery from bilateral total knee replacements.  She has been going to physical therapy which has been helpful.  Closed nondisplaced fracture of body of left talus, sequela-reports intermittent discomfort.  DDD (degenerative disc disease), cervical-she has some limitation with range of motion without discomfort.  Spondylosis of lumbar spine - S/p laminectomy, decompression and microdiscectomy by Dr. Malcolm in March 2023.  She gives history of intermittent discomfort.  Osteopenia of multiple sites - September 19, 2022 the BMD measured at [facility] is 0.778 g/cm2 with a T-score of -1.9.  She should get repeat DEXA scan in 2026.  Calcium  rich diet and vitamin D  was advised.  Other medical problems are listed as follows:  Chronic anticoagulation-she is on Eliquis .  Paroxysmal atrial fibrillation  History of hypertension  History of high cholesterol  Asthmatic bronchitis , chronic (HCC)  History of  hypothyroidism  Primary insomnia - Ambien  10 mg p.o. nightly as needed insomnia.  Peripheral edema  History of anemia  BMI 37.0-37.9, adult  Orders: Orders Placed This Encounter  Procedures   XR Shoulder Right   No orders of the defined types were placed in this encounter.    Follow-Up Instructions: Return in about 6 months (around 10/06/2024) for Osteoarthritis.   Maya Nash, MD  Note - This record has been created using Animal nutritionist.  Chart creation errors have been sought, but may not always  have been located. Such creation errors do not reflect on  the standard of medical care.
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
## 2024-03-28 ENCOUNTER — Encounter (HOSPITAL_BASED_OUTPATIENT_CLINIC_OR_DEPARTMENT_OTHER): Payer: Self-pay | Admitting: Physical Therapy

## 2024-03-28 ENCOUNTER — Other Ambulatory Visit (HOSPITAL_BASED_OUTPATIENT_CLINIC_OR_DEPARTMENT_OTHER): Payer: Self-pay

## 2024-03-29 ENCOUNTER — Ambulatory Visit (HOSPITAL_BASED_OUTPATIENT_CLINIC_OR_DEPARTMENT_OTHER)
Admission: RE | Admit: 2024-03-29 | Discharge: 2024-03-29 | Disposition: A | Discharge status: Home / Self Care | Source: Ambulatory Visit | Attending: Family Medicine | Admitting: Family Medicine

## 2024-03-29 ENCOUNTER — Encounter (HOSPITAL_BASED_OUTPATIENT_CLINIC_OR_DEPARTMENT_OTHER): Payer: Self-pay | Admitting: Radiology

## 2024-03-29 DIAGNOSIS — Z1231 Encounter for screening mammogram for malignant neoplasm of breast: Secondary | ICD-10-CM | POA: Diagnosis not present

## 2024-03-31 ENCOUNTER — Other Ambulatory Visit: Payer: Self-pay

## 2024-03-31 ENCOUNTER — Other Ambulatory Visit (HOSPITAL_BASED_OUTPATIENT_CLINIC_OR_DEPARTMENT_OTHER): Payer: Self-pay

## 2024-03-31 MED FILL — Levothyroxine Sodium Tab 100 MCG: ORAL | 90 days supply | Qty: 90 | Fill #0 | Status: AC

## 2024-04-02 ENCOUNTER — Encounter (HOSPITAL_BASED_OUTPATIENT_CLINIC_OR_DEPARTMENT_OTHER): Admitting: Physical Therapy

## 2024-04-02 DIAGNOSIS — H52223 Regular astigmatism, bilateral: Secondary | ICD-10-CM | POA: Diagnosis not present

## 2024-04-03 ENCOUNTER — Encounter: Payer: Self-pay | Admitting: Cardiology

## 2024-04-03 ENCOUNTER — Ambulatory Visit: Attending: Cardiology | Admitting: Cardiology

## 2024-04-03 VITALS — BP 140/78 | HR 82 | Ht 60.75 in | Wt 198.0 lb

## 2024-04-03 DIAGNOSIS — I480 Paroxysmal atrial fibrillation: Secondary | ICD-10-CM | POA: Diagnosis not present

## 2024-04-03 NOTE — Patient Instructions (Signed)

## 2024-04-03 NOTE — Progress Notes (Signed)
  Electrophysiology Office Follow up Visit Note:    Date:  04/03/2024   ID:  Rebecca Brooks, DOB March 28, 1951, MRN [id number]  PCP:  Domenica Harlene LABOR, MD  [facility] Cardiologist:  Darryle ONEIDA Decent, MD  [facility] Electrophysiologist:  OLE ONEIDA HOLTS, MD    Interval History:     Rebecca Brooks is a 73 y.o. female who presents for a follow up visit.   I last saw the patient Nov 28, 2023.  At the last appointment her calcium  channel blocker and flecainide  were stopped.  She takes Eliquis  for stroke prophylaxis.  The medications were thought to be contributing to her diastolic heart failure.  Instead of these, she was started on Toprol -XL 75 mg by mouth twice daily.  At the appointment with a PA on May 29, she was tolerating this medication well with good control of her arrhythmias.  She is doing well.  Her heart rhythms have been well-controlled.  She is not taking flecainide .  She takes Eliquis  for stroke prophylaxis without bleeding issues.      Past medical, surgical, social and family history were reviewed.  ROS:   Please see the history of present illness.    All other systems reviewed and are negative.  EKGs/Labs/Other Studies Reviewed:    The following studies were reviewed today:          Physical Exam:    VS:  BP (!) 140/78 (BP Location: Left Arm, Patient Position: Sitting, Cuff Size: Large)   Pulse 82   Ht 5' 0.75 (1.543 m)   Wt 198 lb (89.8 kg)   SpO2 100%   BMI 37.72 kg/m     Wt Readings from Last 3 Encounters:  04/03/24 198 lb (89.8 kg)  02/13/24 195 lb (88.5 kg)  12/20/23 197 lb (89.4 kg)     GEN: no distress CARD: RRR, No MRG RESP: No IWOB. CTAB.      ASSESSMENT:    1. Paroxysmal atrial fibrillation (HCC)    PLAN:    In order of problems listed above:  #Paroxysmal atrial fibrillation On Eliquis  and Toprol -XL.  Follow-up 1 year with APP   Signed, OLE HOLTS, MD, [facility], [facility] 04/03/2024 12:35 PM     Electrophysiology [facility]

## 2024-04-04 ENCOUNTER — Encounter (HOSPITAL_BASED_OUTPATIENT_CLINIC_OR_DEPARTMENT_OTHER)

## 2024-04-05 ENCOUNTER — Encounter: Payer: Self-pay | Admitting: Family Medicine
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
## 2024-04-07 ENCOUNTER — Ambulatory Visit (HOSPITAL_BASED_OUTPATIENT_CLINIC_OR_DEPARTMENT_OTHER): Admitting: Physical Therapy

## 2024-04-07 ENCOUNTER — Encounter (HOSPITAL_BASED_OUTPATIENT_CLINIC_OR_DEPARTMENT_OTHER): Payer: Self-pay | Admitting: Physical Therapy

## 2024-04-07 ENCOUNTER — Other Ambulatory Visit: Payer: Self-pay | Admitting: Family Medicine

## 2024-04-07 DIAGNOSIS — M19042 Primary osteoarthritis, left hand: Secondary | ICD-10-CM | POA: Diagnosis not present

## 2024-04-07 DIAGNOSIS — G8929 Other chronic pain: Secondary | ICD-10-CM | POA: Diagnosis not present

## 2024-04-07 DIAGNOSIS — M5030 Other cervical disc degeneration, unspecified cervical region: Secondary | ICD-10-CM | POA: Diagnosis not present

## 2024-04-07 DIAGNOSIS — M5459 Other low back pain: Secondary | ICD-10-CM

## 2024-04-07 DIAGNOSIS — M19011 Primary osteoarthritis, right shoulder: Secondary | ICD-10-CM | POA: Diagnosis not present

## 2024-04-07 DIAGNOSIS — M47816 Spondylosis without myelopathy or radiculopathy, lumbar region: Secondary | ICD-10-CM | POA: Diagnosis not present

## 2024-04-07 DIAGNOSIS — Z96653 Presence of artificial knee joint, bilateral: Secondary | ICD-10-CM | POA: Diagnosis not present

## 2024-04-07 DIAGNOSIS — R2689 Other abnormalities of gait and mobility: Secondary | ICD-10-CM | POA: Diagnosis not present

## 2024-04-07 DIAGNOSIS — Z8639 Personal history of other endocrine, nutritional and metabolic disease: Secondary | ICD-10-CM | POA: Diagnosis not present

## 2024-04-07 DIAGNOSIS — Z6837 Body mass index (BMI) 37.0-37.9, adult: Secondary | ICD-10-CM | POA: Diagnosis not present

## 2024-04-07 DIAGNOSIS — M19041 Primary osteoarthritis, right hand: Secondary | ICD-10-CM | POA: Diagnosis not present

## 2024-04-07 DIAGNOSIS — Z862 Personal history of diseases of the blood and blood-forming organs and certain disorders involving the immune mechanism: Secondary | ICD-10-CM | POA: Diagnosis not present

## 2024-04-07 DIAGNOSIS — S92125S Nondisplaced fracture of body of left talus, sequela: Secondary | ICD-10-CM | POA: Diagnosis not present

## 2024-04-07 DIAGNOSIS — F5101 Primary insomnia: Secondary | ICD-10-CM | POA: Diagnosis not present

## 2024-04-07 DIAGNOSIS — Z7901 Long term (current) use of anticoagulants: Secondary | ICD-10-CM | POA: Diagnosis not present

## 2024-04-07 DIAGNOSIS — I480 Paroxysmal atrial fibrillation: Secondary | ICD-10-CM | POA: Diagnosis not present

## 2024-04-07 DIAGNOSIS — M6281 Muscle weakness (generalized): Secondary | ICD-10-CM

## 2024-04-07 DIAGNOSIS — M25511 Pain in right shoulder: Secondary | ICD-10-CM | POA: Diagnosis not present

## 2024-04-07 DIAGNOSIS — Z8679 Personal history of other diseases of the circulatory system: Secondary | ICD-10-CM | POA: Diagnosis not present

## 2024-04-07 DIAGNOSIS — J4489 Other specified chronic obstructive pulmonary disease: Secondary | ICD-10-CM | POA: Diagnosis not present

## 2024-04-07 DIAGNOSIS — R600 Localized edema: Secondary | ICD-10-CM | POA: Diagnosis not present

## 2024-04-07 DIAGNOSIS — M19012 Primary osteoarthritis, left shoulder: Secondary | ICD-10-CM | POA: Diagnosis not present

## 2024-04-07 DIAGNOSIS — Z96641 Presence of right artificial hip joint: Secondary | ICD-10-CM | POA: Diagnosis not present

## 2024-04-07 DIAGNOSIS — M8589 Other specified disorders of bone density and structure, multiple sites: Secondary | ICD-10-CM | POA: Diagnosis not present

## 2024-04-07 MED ORDER — CLOBETASOL PROPIONATE 0.05 % EX OINT
1.0000 | TOPICAL_OINTMENT | Freq: Two times a day (BID) | CUTANEOUS | 1 refills | Status: AC | PRN
  Filled 2024-04-07: qty 30, 60d supply, fill #0

## 2024-04-08 ENCOUNTER — Encounter: Payer: Self-pay | Admitting: Rheumatology

## 2024-04-08 ENCOUNTER — Ambulatory Visit: Attending: Rheumatology

## 2024-04-08 ENCOUNTER — Ambulatory Visit: Payer: Medicare Other | Admitting: Rheumatology

## 2024-04-08 ENCOUNTER — Other Ambulatory Visit (HOSPITAL_BASED_OUTPATIENT_CLINIC_OR_DEPARTMENT_OTHER): Payer: Self-pay

## 2024-04-08 VITALS — BP 117/73 | HR 54 | Temp 97.7°F | Resp 16 | Ht 60.75 in | Wt 202.2 lb

## 2024-04-08 DIAGNOSIS — I480 Paroxysmal atrial fibrillation: Secondary | ICD-10-CM

## 2024-04-08 DIAGNOSIS — M19042 Primary osteoarthritis, left hand: Secondary | ICD-10-CM

## 2024-04-08 DIAGNOSIS — R600 Localized edema: Secondary | ICD-10-CM

## 2024-04-08 DIAGNOSIS — F5101 Primary insomnia: Secondary | ICD-10-CM

## 2024-04-08 DIAGNOSIS — Z96653 Presence of artificial knee joint, bilateral: Secondary | ICD-10-CM

## 2024-04-08 DIAGNOSIS — M19011 Primary osteoarthritis, right shoulder: Secondary | ICD-10-CM | POA: Diagnosis not present

## 2024-04-08 DIAGNOSIS — M5030 Other cervical disc degeneration, unspecified cervical region: Secondary | ICD-10-CM

## 2024-04-08 DIAGNOSIS — M8589 Other specified disorders of bone density and structure, multiple sites: Secondary | ICD-10-CM

## 2024-04-08 DIAGNOSIS — M47816 Spondylosis without myelopathy or radiculopathy, lumbar region: Secondary | ICD-10-CM

## 2024-04-08 DIAGNOSIS — M19012 Primary osteoarthritis, left shoulder: Secondary | ICD-10-CM

## 2024-04-08 DIAGNOSIS — M25511 Pain in right shoulder: Secondary | ICD-10-CM

## 2024-04-08 DIAGNOSIS — Z6837 Body mass index (BMI) 37.0-37.9, adult: Secondary | ICD-10-CM

## 2024-04-08 DIAGNOSIS — S92125S Nondisplaced fracture of body of left talus, sequela: Secondary | ICD-10-CM

## 2024-04-08 DIAGNOSIS — Z96641 Presence of right artificial hip joint: Secondary | ICD-10-CM | POA: Diagnosis not present

## 2024-04-08 DIAGNOSIS — M19041 Primary osteoarthritis, right hand: Secondary | ICD-10-CM | POA: Diagnosis not present

## 2024-04-08 DIAGNOSIS — M170 Bilateral primary osteoarthritis of knee: Secondary | ICD-10-CM

## 2024-04-08 DIAGNOSIS — Z862 Personal history of diseases of the blood and blood-forming organs and certain disorders involving the immune mechanism: Secondary | ICD-10-CM

## 2024-04-08 DIAGNOSIS — G8929 Other chronic pain: Secondary | ICD-10-CM

## 2024-04-08 DIAGNOSIS — Z7901 Long term (current) use of anticoagulants: Secondary | ICD-10-CM

## 2024-04-08 DIAGNOSIS — Z6839 Body mass index (BMI) 39.0-39.9, adult: Secondary | ICD-10-CM

## 2024-04-08 DIAGNOSIS — J4489 Other specified chronic obstructive pulmonary disease: Secondary | ICD-10-CM

## 2024-04-08 DIAGNOSIS — Z8639 Personal history of other endocrine, nutritional and metabolic disease: Secondary | ICD-10-CM

## 2024-04-08 DIAGNOSIS — Z96652 Presence of left artificial knee joint: Secondary | ICD-10-CM

## 2024-04-08 DIAGNOSIS — Z8679 Personal history of other diseases of the circulatory system: Secondary | ICD-10-CM

## 2024-04-08 MED ORDER — TRIAMCINOLONE ACETONIDE 40 MG/ML IJ SUSP
40.0000 mg | INTRAMUSCULAR | Status: AC | PRN
  Administered 2024-04-08: 40 mg via INTRA_ARTICULAR

## 2024-04-08 MED ORDER — LIDOCAINE HCL 1 % IJ SOLN
1.5000 mL | INTRAMUSCULAR | Status: AC | PRN
  Administered 2024-04-08: 1.5 mL

## 2024-04-08 NOTE — Patient Instructions (Signed)
Shoulder Exercises Ask your health care provider which exercises are safe for you. Do exercises exactly as told by your health care provider and adjust them as directed. It is normal to feel mild stretching, pulling, tightness, or discomfort as you do these exercises. Stop right away if you feel sudden pain or your pain gets worse. Do not begin these exercises until told by your health care provider. Stretching exercises External rotation and abduction This exercise is sometimes called corner stretch. The exercise rotates your arm outward (external rotation) and moves your arm out from your body (abduction). Stand in a doorway with one of your feet slightly in front of the other. This is called a staggered stance. If you cannot reach your forearms to the door frame, stand facing a corner of a room. Choose one of the following positions as told by your health care provider: Place your hands and forearms on the door frame above your head. Place your hands and forearms on the door frame at the height of your head. Place your hands on the door frame at the height of your elbows. Slowly move your weight onto your front foot until you feel a stretch across your chest and in the front of your shoulders. Keep your head and chest upright and keep your abdominal muscles tight. Hold for __________ seconds. To release the stretch, shift your weight to your back foot. Repeat __________ times. Complete this exercise __________ times a day. Extension, standing  Stand and hold a broomstick, a cane, or a similar object behind your back. Your hands should be a little wider than shoulder-width apart. Your palms should face away from your back. Keeping your elbows straight and your shoulder muscles relaxed, move the stick away from your body until you feel a stretch in your shoulders (extension). Avoid shrugging your shoulders while you move the stick. Keep your shoulder blades tucked down toward the middle of your  back. Hold for __________ seconds. Slowly return to the starting position. Repeat __________ times. Complete this exercise __________ times a day. Range-of-motion exercises Pendulum  Stand near a wall or a surface that you can hold onto for balance. Bend at the waist and let your left / right arm hang straight down. Use your other arm to support you. Keep your back straight and do not lock your knees. Relax your left / right arm and shoulder muscles, and move your hips and your trunk so your left / right arm swings freely. Your arm should swing because of the motion of your body, not because you are using your arm or shoulder muscles. Keep moving your hips and trunk so your arm swings in the following directions, as told by your health care provider: Side to side. Forward and backward. In clockwise and counterclockwise circles. Continue each motion for __________ seconds, or for as long as told by your health care provider. Slowly return to the starting position. Repeat __________ times. Complete this exercise __________ times a day. Shoulder flexion, standing  Stand and hold a broomstick, a cane, or a similar object. Place your hands a little more than shoulder-width apart on the object. Your left / right hand should be palm-up, and your other hand should be palm-down. Keep your elbow straight and your shoulder muscles relaxed. Push the stick up with your healthy arm to raise your left / right arm in front of your body, and then over your head until you feel a stretch in your shoulder (flexion). Avoid shrugging your shoulder  while you raise your arm. Keep your shoulder blade tucked down toward the middle of your back. Hold for __________ seconds. Slowly return to the starting position. Repeat __________ times. Complete this exercise __________ times a day. Shoulder abduction, standing  Stand and hold a broomstick, a cane, or a similar object. Place your hands a little more than  shoulder-width apart on the object. Your left / right hand should be palm-up, and your other hand should be palm-down. Keep your elbow straight and your shoulder muscles relaxed. Push the object across your body toward your left / right side. Raise your left / right arm to the side of your body (abduction) until you feel a stretch in your shoulder. Do not raise your arm above shoulder height unless your health care provider tells you to do that. If directed, raise your arm over your head. Avoid shrugging your shoulder while you raise your arm. Keep your shoulder blade tucked down toward the middle of your back. Hold for __________ seconds. Slowly return to the starting position. Repeat __________ times. Complete this exercise __________ times a day. Internal rotation  Place your left / right hand behind your back, palm-up. Use your other hand to dangle an exercise band, a broomstick, or a similar object over your shoulder. Grasp the band with your left / right hand so you are holding on to both ends. Gently pull up on the band until you feel a stretch in the front of your left / right shoulder. The movement of your arm toward the center of your body is called internal rotation. Avoid shrugging your shoulder while you raise your arm. Keep your shoulder blade tucked down toward the middle of your back. Hold for __________ seconds. Release the stretch by letting go of the band and lowering your hands. Repeat __________ times. Complete this exercise __________ times a day. Strengthening exercises External rotation  Sit in a stable chair without armrests. Secure an exercise band to a stable object at elbow height on your left / right side. Place a soft object, such as a folded towel or a small pillow, between your left / right upper arm and your body to move your elbow about 4 inches (10 cm) away from your side. Hold the end of the exercise band so it is tight and there is no slack. Keeping your  elbow pressed against the soft object, slowly move your forearm out, away from your abdomen (external rotation). Keep your body steady so only your forearm moves. Hold for __________ seconds. Slowly return to the starting position. Repeat __________ times. Complete this exercise __________ times a day. Shoulder abduction  Sit in a stable chair without armrests, or stand up. Hold a __________ lb / kg weight in your left / right hand, or hold an exercise band with both hands. Start with your arms straight down and your left / right palm facing in, toward your body. Slowly lift your left / right hand out to your side (abduction). Do not lift your hand above shoulder height unless your health care provider tells you that this is safe. Keep your arms straight. Avoid shrugging your shoulder while you do this movement. Keep your shoulder blade tucked down toward the middle of your back. Hold for __________ seconds. Slowly lower your arm, and return to the starting position. Repeat __________ times. Complete this exercise __________ times a day. Shoulder extension  Sit in a stable chair without armrests, or stand up. Secure an exercise band to a  stable object in front of you so it is at shoulder height. Hold one end of the exercise band in each hand. Straighten your elbows and lift your hands up to shoulder height. Squeeze your shoulder blades together as you pull your hands down to the sides of your thighs (extension). Stop when your hands are straight down by your sides. Do not let your hands go behind your body. Hold for __________ seconds. Slowly return to the starting position. Repeat __________ times. Complete this exercise __________ times a day. Shoulder row  Sit in a stable chair without armrests, or stand up. Secure an exercise band to a stable object in front of you so it is at chest height. Hold one end of the exercise band in each hand. Position your palms so that your thumbs are  facing the ceiling (neutral position). Bend each of your elbows to a 90-degree angle (right angle) and keep your upper arms at your sides. Step back or move the chair back until the band is tight and there is no slack. Slowly pull your elbows back behind you. Hold for __________ seconds. Slowly return to the starting position. Repeat __________ times. Complete this exercise __________ times a day. Shoulder press-ups  Sit in a stable chair that has armrests. Sit upright, with your feet flat on the floor. Put your hands on the armrests so your elbows are bent and your fingers are pointing forward. Your hands should be about even with the sides of your body. Push down on the armrests and use your arms to lift yourself off the chair. Straighten your elbows and lift yourself up as much as you comfortably can. Move your shoulder blades down, and avoid letting your shoulders move up toward your ears. Keep your feet on the ground. As you get stronger, your feet should support less of your body weight as you lift yourself up. Hold for __________ seconds. Slowly lower yourself back into the chair. Repeat __________ times. Complete this exercise __________ times a day. Wall push-ups  Stand so you are facing a stable wall. Your feet should be about one arm-length away from the wall. Lean forward and place your palms on the wall at shoulder height. Keep your feet flat on the floor as you bend your elbows and lean forward toward the wall. Hold for __________ seconds. Straighten your elbows to push yourself back to the starting position. Repeat __________ times. Complete this exercise __________ times a day. This information is not intended to replace advice given to you by your health care provider. Make sure you discuss any questions you have with your health care provider. Document Revised: 08/30/2021 Document Reviewed: 08/30/2021 [employer] Patient Education  2024 [employer] Hand Exercises Hand  exercises can be helpful for almost anyone. They can strengthen your hands and improve flexibility and movement. The exercises can also increase blood flow to the hands. These results can make your work and daily tasks easier for you. Hand exercises can be especially helpful for people who have joint pain from arthritis or nerve damage from using their hands over and over. These exercises can also help people who injure a hand. Exercises Most of these hand exercises are gentle stretching and motion exercises. It is usually safe to do them often throughout the day. Warming up your hands before exercise may help reduce stiffness. You can do this with gentle massage or by placing your hands in warm water for 10-15 minutes. It is normal to feel some stretching, pulling,  tightness, or mild discomfort when you begin new exercises. In time, this will improve. Remember to always be careful and stop right away if you feel sudden, very bad pain or your pain gets worse. You want to get better and be safe. Ask your health care provider which exercises are safe for you. Do exercises exactly as told by your provider and adjust them as told. Do not begin these exercises until told by your provider. Knuckle bend or "claw" fist  Stand or sit with your arm, hand, and all five fingers pointed straight up. Make sure to keep your wrist straight. Gently bend your fingers down toward your palm until the tips of your fingers are touching your palm. Keep your big knuckle straight and only bend the small knuckles in your fingers. Hold this position for 10 seconds. Straighten your fingers back to your starting position. Repeat this exercise 5-10 times with each hand. Full finger fist  Stand or sit with your arm, hand, and all five fingers pointed straight up. Make sure to keep your wrist straight. Gently bend your fingers into your palm until the tips of your fingers are touching the middle of your palm. Hold this position  for 10 seconds. Extend your fingers back to your starting position, stretching every joint fully. Repeat this exercise 5-10 times with each hand. Straight fist  Stand or sit with your arm, hand, and all five fingers pointed straight up. Make sure to keep your wrist straight. Gently bend your fingers at the big knuckle, where your fingers meet your hand, and at the middle knuckle. Keep the knuckle at the tips of your fingers straight and try to touch the bottom of your palm. Hold this position for 10 seconds. Extend your fingers back to your starting position, stretching every joint fully. Repeat this exercise 5-10 times with each hand. Tabletop  Stand or sit with your arm, hand, and all five fingers pointed straight up. Make sure to keep your wrist straight. Gently bend your fingers at the big knuckle, where your fingers meet your hand, as far down as you can. Keep the small knuckles in your fingers straight. Think of forming a tabletop with your fingers. Hold this position for 10 seconds. Extend your fingers back to your starting position, stretching every joint fully. Repeat this exercise 5-10 times with each hand. Finger spread  Place your hand flat on a table with your palm facing down. Make sure your wrist stays straight. Spread your fingers and thumb apart from each other as far as you can until you feel a gentle stretch. Hold this position for 10 seconds. Bring your fingers and thumb tight together again. Hold this position for 10 seconds. Repeat this exercise 5-10 times with each hand. Making circles  Stand or sit with your arm, hand, and all five fingers pointed straight up. Make sure to keep your wrist straight. Make a circle by touching the tip of your thumb to the tip of your index finger. Hold for 10 seconds. Then open your hand wide. Repeat this motion with your thumb and each of your fingers. Repeat this exercise 5-10 times with each hand. Thumb motion  Sit with your  forearm resting on a table and your wrist straight. Your thumb should be facing up toward the ceiling. Keep your fingers relaxed as you move your thumb. Lift your thumb up as high as you can toward the ceiling. Hold for 10 seconds. Bend your thumb across your palm as far as  you can, reaching the tip of your thumb for the small finger (pinkie) side of your palm. Hold for 10 seconds. Repeat this exercise 5-10 times with each hand. Grip strengthening  Hold a stress ball or other soft ball in the middle of your hand. Slowly increase the pressure, squeezing the ball as much as you can without causing pain. Think of bringing the tips of your fingers into the middle of your palm. All of your finger joints should bend when doing this exercise. Hold your squeeze for 10 seconds, then relax. Repeat this exercise 5-10 times with each hand. Contact a health care provider if: Your hand pain or discomfort gets much worse when you do an exercise. Your hand pain or discomfort does not improve within 2 hours after you exercise. If you have either of these problems, stop doing these exercises right away. Do not do them again unless your provider says that you can. Get help right away if: You develop sudden, severe hand pain or swelling. If this happens, stop doing these exercises right away. Do not do them again unless your provider says that you can. This information is not intended to replace advice given to you by your health care provider. Make sure you discuss any questions you have with your health care provider. Document Revised: 07/25/2022 Document Reviewed: 07/25/2022 [employer] Patient Education  2024 [employer].

## 2024-04-09 ENCOUNTER — Encounter (HOSPITAL_BASED_OUTPATIENT_CLINIC_OR_DEPARTMENT_OTHER): Payer: Self-pay | Admitting: Physical Therapy

## 2024-04-09 ENCOUNTER — Ambulatory Visit (HOSPITAL_BASED_OUTPATIENT_CLINIC_OR_DEPARTMENT_OTHER): Payer: Self-pay | Admitting: Physical Therapy

## 2024-04-09 DIAGNOSIS — Z8679 Personal history of other diseases of the circulatory system: Secondary | ICD-10-CM | POA: Diagnosis not present

## 2024-04-09 DIAGNOSIS — J4489 Other specified chronic obstructive pulmonary disease: Secondary | ICD-10-CM | POA: Diagnosis not present

## 2024-04-09 DIAGNOSIS — M19041 Primary osteoarthritis, right hand: Secondary | ICD-10-CM | POA: Diagnosis not present

## 2024-04-09 DIAGNOSIS — S92125S Nondisplaced fracture of body of left talus, sequela: Secondary | ICD-10-CM | POA: Diagnosis not present

## 2024-04-09 DIAGNOSIS — M19042 Primary osteoarthritis, left hand: Secondary | ICD-10-CM | POA: Diagnosis not present

## 2024-04-09 DIAGNOSIS — F5101 Primary insomnia: Secondary | ICD-10-CM | POA: Diagnosis not present

## 2024-04-09 DIAGNOSIS — Z7901 Long term (current) use of anticoagulants: Secondary | ICD-10-CM | POA: Diagnosis not present

## 2024-04-09 DIAGNOSIS — M5459 Other low back pain: Secondary | ICD-10-CM | POA: Diagnosis not present

## 2024-04-09 DIAGNOSIS — R2689 Other abnormalities of gait and mobility: Secondary | ICD-10-CM

## 2024-04-09 DIAGNOSIS — I480 Paroxysmal atrial fibrillation: Secondary | ICD-10-CM | POA: Diagnosis not present

## 2024-04-09 DIAGNOSIS — M47816 Spondylosis without myelopathy or radiculopathy, lumbar region: Secondary | ICD-10-CM | POA: Diagnosis not present

## 2024-04-09 DIAGNOSIS — M5030 Other cervical disc degeneration, unspecified cervical region: Secondary | ICD-10-CM | POA: Diagnosis not present

## 2024-04-09 DIAGNOSIS — Z96653 Presence of artificial knee joint, bilateral: Secondary | ICD-10-CM | POA: Diagnosis not present

## 2024-04-09 DIAGNOSIS — M25511 Pain in right shoulder: Secondary | ICD-10-CM | POA: Diagnosis not present

## 2024-04-09 DIAGNOSIS — M8589 Other specified disorders of bone density and structure, multiple sites: Secondary | ICD-10-CM | POA: Diagnosis not present

## 2024-04-09 DIAGNOSIS — G8929 Other chronic pain: Secondary | ICD-10-CM | POA: Diagnosis not present

## 2024-04-09 DIAGNOSIS — Z96641 Presence of right artificial hip joint: Secondary | ICD-10-CM | POA: Diagnosis not present

## 2024-04-09 DIAGNOSIS — M19011 Primary osteoarthritis, right shoulder: Secondary | ICD-10-CM | POA: Diagnosis not present

## 2024-04-09 DIAGNOSIS — M6281 Muscle weakness (generalized): Secondary | ICD-10-CM

## 2024-04-09 DIAGNOSIS — Z6837 Body mass index (BMI) 37.0-37.9, adult: Secondary | ICD-10-CM | POA: Diagnosis not present

## 2024-04-09 DIAGNOSIS — Z8639 Personal history of other endocrine, nutritional and metabolic disease: Secondary | ICD-10-CM | POA: Diagnosis not present

## 2024-04-09 DIAGNOSIS — Z862 Personal history of diseases of the blood and blood-forming organs and certain disorders involving the immune mechanism: Secondary | ICD-10-CM | POA: Diagnosis not present

## 2024-04-09 DIAGNOSIS — R600 Localized edema: Secondary | ICD-10-CM | POA: Diagnosis not present

## 2024-04-09 DIAGNOSIS — M19012 Primary osteoarthritis, left shoulder: Secondary | ICD-10-CM | POA: Diagnosis not present
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
## 2024-04-15 ENCOUNTER — Other Ambulatory Visit (HOSPITAL_BASED_OUTPATIENT_CLINIC_OR_DEPARTMENT_OTHER): Payer: Self-pay

## 2024-04-15 DIAGNOSIS — S80219A Abrasion, unspecified knee, initial encounter: Secondary | ICD-10-CM | POA: Diagnosis not present

## 2024-04-15 DIAGNOSIS — Z888 Allergy status to other drugs, medicaments and biological substances status: Secondary | ICD-10-CM | POA: Diagnosis not present

## 2024-04-15 DIAGNOSIS — I10 Essential (primary) hypertension: Secondary | ICD-10-CM | POA: Diagnosis not present

## 2024-04-15 DIAGNOSIS — Z9884 Bariatric surgery status: Secondary | ICD-10-CM | POA: Diagnosis not present

## 2024-04-15 DIAGNOSIS — R296 Repeated falls: Secondary | ICD-10-CM | POA: Diagnosis not present

## 2024-04-15 DIAGNOSIS — Z79899 Other long term (current) drug therapy: Secondary | ICD-10-CM | POA: Diagnosis not present

## 2024-04-15 DIAGNOSIS — K219 Gastro-esophageal reflux disease without esophagitis: Secondary | ICD-10-CM | POA: Diagnosis not present

## 2024-04-15 DIAGNOSIS — Z2821 Immunization not carried out because of patient refusal: Secondary | ICD-10-CM | POA: Diagnosis not present

## 2024-04-15 DIAGNOSIS — Z87891 Personal history of nicotine dependence: Secondary | ICD-10-CM | POA: Diagnosis not present

## 2024-04-15 DIAGNOSIS — W19XXXA Unspecified fall, initial encounter: Secondary | ICD-10-CM | POA: Diagnosis not present

## 2024-04-15 DIAGNOSIS — Z9089 Acquired absence of other organs: Secondary | ICD-10-CM | POA: Diagnosis not present

## 2024-04-15 DIAGNOSIS — W0110XA Fall on same level from slipping, tripping and stumbling with subsequent striking against unspecified object, initial encounter: Secondary | ICD-10-CM | POA: Diagnosis not present

## 2024-04-15 DIAGNOSIS — R519 Headache, unspecified: Secondary | ICD-10-CM | POA: Diagnosis not present

## 2024-04-15 DIAGNOSIS — R040 Epistaxis: Secondary | ICD-10-CM | POA: Diagnosis not present

## 2024-04-15 DIAGNOSIS — Z9889 Other specified postprocedural states: Secondary | ICD-10-CM | POA: Diagnosis not present

## 2024-04-15 DIAGNOSIS — Z7901 Long term (current) use of anticoagulants: Secondary | ICD-10-CM | POA: Diagnosis not present

## 2024-04-15 DIAGNOSIS — J019 Acute sinusitis, unspecified: Secondary | ICD-10-CM | POA: Diagnosis not present

## 2024-04-15 DIAGNOSIS — S0990XA Unspecified injury of head, initial encounter: Secondary | ICD-10-CM | POA: Diagnosis not present

## 2024-04-15 DIAGNOSIS — E785 Hyperlipidemia, unspecified: Secondary | ICD-10-CM | POA: Diagnosis not present

## 2024-04-15 DIAGNOSIS — S022XXA Fracture of nasal bones, initial encounter for closed fracture: Secondary | ICD-10-CM | POA: Diagnosis not present

## 2024-04-15 DIAGNOSIS — I4891 Unspecified atrial fibrillation: Secondary | ICD-10-CM | POA: Diagnosis not present

## 2024-04-15 MED ORDER — SALINE NASAL SPRAY 0.65 % NA SOLN: 1.0000 | Freq: Every day | NASAL | 0 refills | Status: AC | PRN

## 2024-04-15 MED ORDER — TRAMADOL HCL 50 MG PO TABS
50.0000 mg | ORAL_TABLET | Freq: Three times a day (TID) | ORAL | 0 refills | Status: DC | PRN
  Filled 2024-04-15: qty 11, 2d supply, fill #0

## 2024-04-15 MED ORDER — NASAL SPRAY 0.05 % NA SOLN
2.0000 | Freq: Two times a day (BID) | NASAL | 0 refills | Status: DC | PRN
  Filled 2024-05-15: qty 30, fill #0

## 2024-04-15 MED FILL — Pantoprazole Sodium EC Tab 40 MG (Base Equiv): ORAL | 90 days supply | Qty: 90 | Fill #0 | Status: AC

## 2024-04-16 ENCOUNTER — Telehealth: Payer: Self-pay

## 2024-04-16 ENCOUNTER — Ambulatory Visit (HOSPITAL_BASED_OUTPATIENT_CLINIC_OR_DEPARTMENT_OTHER): Payer: Self-pay

## 2024-04-16 NOTE — Telephone Encounter (Signed)
 Copied from CRM [id number]. Topic: Referral - Request for Referral >> Apr 16, 2024  4:35 PM Kendralyn S wrote: Did the patient discuss referral with their provider in the last year? No (If No - schedule appointment) (If Yes - send message)  Appointment offered? Yes, but she did not want an appointment with a different doctor. Dr. Domenica does not have appointments until Jan 2026  Type of order/referral and detailed reason for visit: pt fell on 04/15/24 and went to the ER and was told she would need to see a ENT  Preference of office, provider, location:   If referral order, have you been seen by this specialty before? No (If Yes, this issue or another issue? When? Where?  Can we respond through MyChart? Yes

## 2024-04-16 NOTE — Telephone Encounter (Unsigned)
 Copied from CRM #[id number]. Topic: Clinical - Medication Refill >> Apr 16, 2024  4:27 PM Kendralyn S wrote: Medication: traMADol  (ULTRAM ) 50 MG tablet  Has the patient contacted their pharmacy? Yes (Agent: If no, request that the patient contact the pharmacy for the refill. If patient does not wish to contact the pharmacy document the reason why and proceed with request.) (Agent: If yes, when and what did the pharmacy advise?)  This is the patient's preferred pharmacy:  [facility] [address] [place] [address] Phone: [phone] Fax: [phone]  Is this the correct pharmacy for this prescription? Yes If no, delete pharmacy and type the correct one.   Has the prescription been filled recently? Yes  Is the patient out of the medication? No  Has the patient been seen for an appointment in the last year OR does the patient have an upcoming appointment? Yes  Can we respond through MyChart? Yes  Agent: Please be advised that Rx refills may take up to 3 business days. We ask that you follow-up with your pharmacy.

## 2024-04-17 ENCOUNTER — Other Ambulatory Visit (HOSPITAL_BASED_OUTPATIENT_CLINIC_OR_DEPARTMENT_OTHER): Payer: Self-pay

## 2024-04-17 ENCOUNTER — Other Ambulatory Visit (INDEPENDENT_AMBULATORY_CARE_PROVIDER_SITE_OTHER)

## 2024-04-17 ENCOUNTER — Ambulatory Visit: Payer: Self-pay | Admitting: Family Medicine

## 2024-04-17 ENCOUNTER — Encounter: Payer: Self-pay | Admitting: Family Medicine

## 2024-04-17 ENCOUNTER — Ambulatory Visit (INDEPENDENT_AMBULATORY_CARE_PROVIDER_SITE_OTHER): Admitting: Family Medicine

## 2024-04-17 VITALS — BP 132/78 | HR 63 | Temp 97.6°F | Resp 16 | Ht 60.75 in | Wt 200.2 lb

## 2024-04-17 DIAGNOSIS — E039 Hypothyroidism, unspecified: Secondary | ICD-10-CM

## 2024-04-17 DIAGNOSIS — S0292XD Unspecified fracture of facial bones, subsequent encounter for fracture with routine healing: Secondary | ICD-10-CM

## 2024-04-17 DIAGNOSIS — D508 Other iron deficiency anemias: Secondary | ICD-10-CM | POA: Diagnosis not present

## 2024-04-17 DIAGNOSIS — S022XXD Fracture of nasal bones, subsequent encounter for fracture with routine healing: Secondary | ICD-10-CM | POA: Diagnosis not present

## 2024-04-17 DIAGNOSIS — I10 Essential (primary) hypertension: Secondary | ICD-10-CM

## 2024-04-17 LAB — CBC WITH DIFFERENTIAL/PLATELET
Basophils Absolute: 0.1 K/uL (ref 0.0–0.1)
Basophils Relative: 0.8 % (ref 0.0–3.0)
Eosinophils Absolute: 0.1 K/uL (ref 0.0–0.7)
Eosinophils Relative: 0.9 % (ref 0.0–5.0)
HCT: 41.9 % (ref 36.0–46.0)
Hemoglobin: 13.7 g/dL (ref 12.0–15.0)
Lymphocytes Relative: 16.3 % (ref 12.0–46.0)
Lymphs Abs: 1.3 K/uL (ref 0.7–4.0)
MCHC: 32.7 g/dL (ref 30.0–36.0)
MCV: 92.9 fl (ref 78.0–100.0)
Monocytes Absolute: 0.5 K/uL (ref 0.1–1.0)
Monocytes Relative: 6.2 % (ref 3.0–12.0)
Neutro Abs: 6.2 K/uL (ref 1.4–7.7)
Neutrophils Relative %: 75.8 % (ref 43.0–77.0)
Platelets: 368 K/uL (ref 150.0–400.0)
RBC: 4.51 Mil/uL (ref 3.87–5.11)
RDW: 13.3 % (ref 11.5–15.5)
WBC: 8.2 K/uL (ref 4.0–10.5)

## 2024-04-17 MED ORDER — TIZANIDINE HCL 2 MG PO TABS
1.0000 mg | ORAL_TABLET | Freq: Three times a day (TID) | ORAL | 1 refills | Status: AC | PRN
  Filled 2024-04-17: qty 30, 5d supply, fill #0

## 2024-04-17 MED ORDER — TRAMADOL HCL 50 MG PO TABS
50.0000 mg | ORAL_TABLET | Freq: Three times a day (TID) | ORAL | 0 refills | Status: DC | PRN
  Filled 2024-04-17: qty 90, 15d supply, fill #0

## 2024-04-17 NOTE — Patient Instructions (Signed)
 Nasal Fracture A fracture is a break in a bone. A nasal fracture is a broken nose. Minor breaks do not need treatment. They often heal on their own in about a month. Serious breaks may need treatment. Sometimes, surgery is needed. What are the causes? This condition is usually caused by a direct hit to the nose. This often occurs from: Playing a contact sport. Being in a car accident. Falling. Getting punched in the face. What are the signs or symptoms? Pain. Swelling of the nose. Bleeding from the nose. Bruises around the nose or eyes, including black eyes. The nose having a crooked shape. How is this treated? Treatment depends on how bad the injury is. Minor breaks may heal on their own. They often do not need treatment. For more serious breaks that have caused bones to move out of position, treatment may include: Numbing the nose area with medicines and moving the bones back into position without surgery. Your doctor may be able to do this in his or her office. Surgery. If this is needed, it will be done after the swelling is gone. Follow these instructions at home: Activity Return to your normal activities when your doctor says that it is safe. Do not play contact sports for 3-4 weeks or as told by your doctor. Managing pain and swelling If told, put ice on the injured area. To do this: Put ice in a plastic bag. Place a towel between your skin and the bag. Leave the ice on for 20 minutes, 2-3 times a day. Take off the ice if your skin turns bright red. This is very important. If you cannot feel pain, heat, or cold, you have a greater risk of damage to the area. General instructions     Take over-the-counter and prescription medicines only as told by your doctor. If your nose bleeds, sit up while you gently squeeze your nose shut for 10 minutes. Try to not blow your nose. Keep all follow-up visits. Contact a doctor if: You have more pain or very bad pain. You keep having  nosebleeds. The shape of your nose does not return to normal after 5 days. You have pus coming out of your nose. Get help right away if: Your nose bleeds for more than 20 minutes. You have clear fluid draining out of your nose. You have a swelling on the inside of your nose that does not get better. You have trouble moving your eyes. You keep vomiting. These symptoms may be an emergency. Get help right away. Call 911. Do not wait to see if the symptoms will go away. Do not drive yourself to the hospital. Summary A nasal fracture is a broken nose. Symptoms include pain, swelling, and bruising. Minor breaks often do not require treatment. More serious breaks may require surgery or other treatments. If your nose bleeds, sit up while you gently squeeze your nose shut for 10 minutes. This information is not intended to replace advice given to you by your health care provider. Make sure you discuss any questions you have with your health care provider. Document Revised: 02/16/2021 Document Reviewed: 02/16/2021 Elsevier Patient Education  2024 [employer].

## 2024-04-17 NOTE — Telephone Encounter (Signed)
 Called patient and she was scheduled for today,04/17/24 @ 3:40 PM w/ Dr. Domenica for f/u on fall.

## 2024-04-18 ENCOUNTER — Encounter (HOSPITAL_BASED_OUTPATIENT_CLINIC_OR_DEPARTMENT_OTHER): Admitting: Physical Therapy

## 2024-04-18 ENCOUNTER — Encounter: Payer: Self-pay | Admitting: Family Medicine

## 2024-04-18 LAB — TSH: TSH: 2.47 u[IU]/mL (ref 0.35–5.50)

## 2024-04-18 NOTE — Assessment & Plan Note (Signed)
 Increase leafy greens, consider increased lean red meat and using cast iron cookware. Continue to monitor, report any concerns. Monitor with CBC will check today due to increased risk for fall if worsening

## 2024-04-18 NOTE — Assessment & Plan Note (Signed)
 Well controlled, no changes to meds. Encouraged heart healthy diet such as the DASH diet and exercise as tolerated.

## 2024-04-18 NOTE — Assessment & Plan Note (Signed)
 On Levothyroxine , continue to monitor, can be a risk factor for falls so will check TSH today

## 2024-04-18 NOTE — Progress Notes (Signed)
 Subjective:    [name], female    DOB: 1951/01/12, 73 y.o.   MRN: [id number]  Chief Complaint  Patient presents with   Acute Visit    Patient presents today for a follow-up in a fall from 04/15/24. She was seen at [facility] Emergency Department and a referral was placed to ENT due to open fracture to her nose. Patient would like a different referral to an ENT in [place] area.    HPI Discussed the use of AI scribe software for clinical note transcription with the patient, who gave verbal consent to proceed.  History of Present Illness Rebecca Brooks is a 73 year old female who presents after a fall with a possible nasal fracture.  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 She landed on her face, resulting in a possible nasal fracture and a scrape on her face, likely from a brick surface. She also has bruising on her knee and left elbow, with muscle soreness in the left arm but no sharp pain on movement.  A CT scan at [facility] suggested a possible old fracture in the nasal area, though she does not recall any previous nasal injuries. No blood work was performed during her ER visit.  Her left shoulder, which was recently x-rayed and injected, has been problematic and she is scheduled to see a surgeon for further evaluation. The shoulder is at baseline pain levels post-fall, and she is scheduled to see a surgeon for further evaluation. She is relieved that she did not attempt to break her fall with this arm, which could have worsened her shoulder condition.  She reports a dull headache that has improved since the fall, and her neck is sore, likely to worsen over the next few days. She has a history of epistaxis, which have been more posterior recently, and she is cautious about potential bleeding post-fall.  She resides at [facility] and uses [employer]. She has a history of mobility issues  but has been improving, no longer using a cane. She is a retired Education administrator and enjoys painting.  No new urinary symptoms, fever, acute illness, nausea, severe headache, hearing or vision changes, chest pain, or bowel troubles.    Past Medical History:  Diagnosis Date   Abnormal nuclear stress test    False positive. Cath 08/23/15 showed Angiographically normal coronary arteries   Anemia    Ankle fracture    Arthritis    Asthma    asthmatic bronchitis   Atrial fibrillation (HCC)    Back pain    Chicken pox as a child   Edema of both lower extremities    Essential hypertension 07/17/2011   ACEI d/c 06/2011 for refractory cough> resolved    GERD (gastroesophageal reflux disease)    Hyperlipidemia    Hypertension    Hypothyroidism    Insomnia 11/01/2014   Insulin  resistance    pt reports she was told this in the past, does not take any medication for this   Joint pain    Low back pain radiating to right leg    intermittent and occasional numbness of skin over right hip in certain positions   Measles as a child   Mumps as a child   Obesity    Osteoarthritis    Palpitation    Pneumonia    this October 2013 from which she has said inhailer   PONV (postoperative nausea and vomiting)  violent vomiting after surgery   Recurrent epistaxis 02/20/2012   RLS (restless legs syndrome) 11/01/2014   Skin cancer    Sleep apnea    cpap   SOB (shortness of breath) on exertion    Spinal stenosis    Vitamin B 12 deficiency    Vitamin D  deficiency     Past Surgical History:  Procedure Laterality Date   CARDIAC CATHETERIZATION N/A 08/23/2015   Procedure: Left Heart Cath and Coronary Angiography;  Surgeon: Ozell Fell, MD;  Location: [facility] INVASIVE CV LAB;  Service: Cardiovascular;  Laterality: N/A;   CATARACT EXTRACTION Bilateral    CESAREAN SECTION     X 3   GASTRECTOMY  12/2017   INGUINAL HERNIA REPAIR Right 73 yrs old   LUMBAR LAMINECTOMY/DECOMPRESSION MICRODISCECTOMY Bilateral  10/21/2021   Procedure: Laminectomy and Foraminotomy - bilateral - Lumbar Three-Lumbar Four - Lumbar Four- Lumbar Five;  Surgeon: Louis Shove, MD;  Location: [facility] OR;  Service: Neurosurgery;  Laterality: Bilateral;   THYROIDECTOMY     total for benign tumor, Parathyroid spared   TONSILLECTOMY     TOTAL HIP ARTHROPLASTY Right 2006   secondary to congenital  hip defect   TOTAL KNEE ARTHROPLASTY Left 01/22/2023   Procedure: LEFT TOTAL KNEE ARTHROPLASTY;  Surgeon: Melodi Lerner, MD;  Location: [facility] ORS;  Service: Orthopedics;  Laterality: Left;   TOTAL KNEE ARTHROPLASTY Right 06/18/2023   Procedure: TOTAL KNEE ARTHROPLASTY;  Surgeon: Melodi Lerner, MD;  Location: [facility] ORS;  Service: Orthopedics;  Laterality: Right;    Family History  Problem Relation Age of Onset   Cancer Mother    Heart failure Mother    Lung cancer Mother 18       smoker   Heart Problems Mother        tachycardia   Hearing loss Mother    Anxiety disorder Mother        anxiety, claustrophobia   Obesity Mother    Osteoarthritis Father    Hyperlipidemia Father    Anuerysm Father        AAA   Sudden death Father    Hyperlipidemia Sister    Migraines Sister    Hypertension Sister    Heart Problems Sister        tachycardia   Colon cancer Maternal Grandmother    Heart attack Maternal Grandfather    Aneurysm Paternal Grandfather        abdominal   Heart disease Paternal Grandfather        AAA rupture, smoker    Social History   Socioeconomic History   Marital status: Married    Spouse name: Not on file   Number of children: 3   Years of education: Not on file   Highest education level: Not on file  Occupational History   Occupation: Tree surgeon   Occupation: Careers information officer   Occupation: Retired Education administrator  Tobacco Use   Smoking status: Former    Current packs/day: 0.00    Average packs/day: 0.5 packs/day for 5.0 years (2.5 ttl pk-yrs)    Types: Cigarettes    Start date: 07/24/1966    Quit date: 07/25/1971    Years  since quitting: 52.7    Passive exposure: Past   Smokeless tobacco: Never   Tobacco comments:    Former smoker 06/06/23  Vaping Use   Vaping status: Never Used  Substance and Sexual Activity   Alcohol use: Yes    Alcohol/week: 1.0 standard drink of alcohol    Types: 1 Standard drinks or equivalent  per week    Comment: vodka/gin something clear 06/06/23   Drug use: No   Sexual activity: Never    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  Other Topics Concern   Not on file  Social History Narrative   Not on file   Social Drivers of Health   Financial Resource Strain: Low Risk  (02/13/2024)   Overall Financial Resource Strain (CARDIA)    Difficulty of Paying Living Expenses: Not very hard  Food Insecurity: No Food Insecurity (02/13/2024)   Hunger Vital Sign    Worried About Running Out of Food in the Last Year: Never true    Ran Out of Food in the Last Year: Never true  Transportation Needs: No Transportation Needs (02/13/2024)   PRAPARE - Administrator, Civil Service (Medical): No    Lack of Transportation (Non-Medical): No  Physical Activity: Inactive (02/13/2024)   Exercise Vital Sign    Days of Exercise per Week: 0 days    Minutes of Exercise per Session: 0 min  Stress: No Stress Concern Present (02/13/2024)   [employer] of Occupational Health - Occupational Stress Questionnaire    Feeling of Stress: Only a little  Social Connections: Socially Integrated (02/13/2024)   Social Connection and Isolation Panel    Frequency of Communication with Friends and Family: Twice a week    Frequency of Social Gatherings with Friends and Family: Once a week    Attends Religious Services: More than 4 times per year    Active Member of [employer] or Organizations: Yes    Attends Engineer, structural: More than 4 times per year    Marital Status: Married  Catering manager Violence: Not At Risk (04/15/2024)   Received from [facility]   HITS     Over the last 12 months how often did your partner physically hurt you?: Never    Over the last 12 months how often did your partner insult you or talk down to you?: Never    Over the last 12 months how often did your partner threaten you with physical harm?: Never    Over the last 12 months how often did your partner scream or curse at you?: Never    Outpatient Medications Prior to Visit  Medication Sig Dispense Refill   acetaminophen  (TYLENOL ) 650 MG CR tablet Take 1,300 mg by mouth 2 (two) times daily.     albuterol  (VENTOLIN  HFA) 108 (90 Base) MCG/ACT inhaler Inhale 1-2 puffs into the lungs every 6 (six) hours as needed for wheezing or shortness of breath. 1 each 0   apixaban  (ELIQUIS ) 5 MG TABS tablet Take 1 tablet (5 mg total) by mouth 2 (two) times daily. 180 tablet 1   benzonatate  (TESSALON ) 200 MG capsule Take 1 capsule (200 mg total) by mouth 3 (three) times daily as needed for cough. (Patient taking differently: Take 200 mg by mouth as needed for cough.) 30 capsule 0   cetirizine  (ZYRTEC ) 10 MG tablet Take 1 tablet (10 mg total) by mouth daily. 90 tablet 1   clobetasol  ointment (TEMOVATE ) 0.05 % Apply to the affected area(s) topically 2 (two) times daily as needed. 30 g 1   Diclofenac  Sodium 3 % GEL Apply 1 Application topically 3 (three) times daily as needed. 100 g 1   famotidine  (PEPCID ) 20 MG tablet TAKE 1 TABLET(20 MG) BY MOUTH AT BEDTIME 90 tablet 3   fluticasone  (FLONASE ) 50 MCG/ACT nasal spray SHAKE LIQUID AND USE 2 SPRAYS  IN EACH NOSTRIL DAILY 48 g 0   furosemide  (LASIX ) 40 MG tablet Take 1 tablet (40 mg total) by mouth daily. 90 tablet 3   levothyroxine  (SYNTHROID ) 100 MCG tablet Take 1 tablet (100 mcg total) by mouth daily before breakfast. 90 tablet 1   metoprolol  succinate (TOPROL -XL) 50 MG 24 hr tablet Take 1.5 tablets (75 mg total) by mouth in the morning and at bedtime. Take with or immediately following a meal. 270 tablet 3   ondansetron  (ZOFRAN ) 4 MG tablet Take 1  tablet (4 mg total) by mouth every 6 (six) hours as needed for nausea. (Patient taking differently: Take 4 mg by mouth as needed for nausea.) 20 tablet 0   Oxymetazoline HCl (NASAL SPRAY) 0.05 % SOLN two sprays by Both Nostrils route 2 (two) times a day as needed (for nasal bleeding). 6 mL 0   pantoprazole  (PROTONIX ) 40 MG tablet Take 1 tablet (40 mg total) by mouth daily. 90 tablet 3   rOPINIRole  (REQUIP ) 2 MG tablet Take 1 tablet (2 mg total) by mouth daily. 30 tablet 5   sodium chloride  (OCEAN) 0.65 % nasal spray Place 1 spray into the nose daily as needed for congestion 44 mL 0   Vitamin D3 (VITAMIN D ) 25 MCG tablet Take 1 tablet (1,000 Units total) by mouth daily. 60 tablet 1   traMADol  (ULTRAM ) 50 MG tablet Take 1-2 tablets (50-100 mg total) by mouth every 8 (eight) hours as needed for up to 11 doses. Max daily amount: 300mg . 11 tablet 0   No facility-administered medications prior to visit.    Allergies  Allergen Reactions   Bee Venom Anaphylaxis   Accupril [Quinapril Hcl] Cough   Quinapril-Hydrochlorothiazide  Cough   Simvastatin  Other (See Comments)    myalgias   Rosuvastatin  Other (See Comments)    Leg aches.    Review of Systems  Constitutional:  Negative for fever and malaise/fatigue.  HENT:  Negative for congestion.   Eyes:  Negative for blurred vision.  Respiratory:  Negative for shortness of breath.   Cardiovascular:  Negative for chest pain, palpitations and leg swelling.  Gastrointestinal:  Negative for abdominal pain, blood in stool and nausea.  Genitourinary:  Negative for dysuria and frequency.  Musculoskeletal:  Positive for falls, joint pain, myalgias and neck pain. Negative for back pain.  Skin:  Negative for rash.  Neurological:  Negative for dizziness, loss of consciousness and headaches.  Endo/Heme/Allergies:  Negative for environmental allergies.  Psychiatric/Behavioral:  Negative for depression. The patient is not nervous/anxious.        Objective:     Physical Exam Constitutional:      General: She is not in acute distress.    Appearance: Normal appearance. She is well-developed. She is not toxic-appearing.  HENT:     Head: Normocephalic and atraumatic.     Right Ear: External ear normal.     Left Ear: External ear normal.     Nose: Nose normal.  Eyes:     General:        Right eye: No discharge.        Left eye: No discharge.     Conjunctiva/sclera: Conjunctivae normal.  Neck:     Thyroid : No thyromegaly.  Cardiovascular:     Rate and Rhythm: Normal rate and regular rhythm.     Heart sounds: Normal heart sounds. No murmur heard. Pulmonary:     Effort: Pulmonary effort is normal. No respiratory distress.     Breath sounds: Normal breath  sounds.  Abdominal:     General: Bowel sounds are normal.     Palpations: Abdomen is soft.     Tenderness: There is no abdominal tenderness. There is no guarding.  Musculoskeletal:        General: Normal range of motion.     Cervical back: Neck supple.     Comments: Small bruises arms and leg, face bruised bilateral around eyes, nose, cheeks and above upper lip  Lymphadenopathy:     Cervical: No cervical adenopathy.  Skin:    General: Skin is warm and dry.  Neurological:     Mental Status: She is alert and oriented to person, place, and time.  Psychiatric:        Mood and Affect: Mood normal.        Behavior: Behavior normal.        Thought Content: Thought content normal.        Judgment: Judgment normal.     BP 132/78   Pulse 63   Temp 97.6 F (36.4 C)   Resp 16   Ht 5' 0.75 (1.543 m)   Wt 200 lb 3.2 oz (90.8 kg)   SpO2 98%   BMI 38.14 kg/m  Wt Readings from Last 3 Encounters:  04/17/24 200 lb 3.2 oz (90.8 kg)  04/08/24 202 lb 3.2 oz (91.7 kg)  04/03/24 198 lb (89.8 kg)    Diabetic Foot Exam - Simple   No data filed    Lab Results  Component Value Date   WBC 8.2 04/17/2024   HGB 13.7 04/17/2024   HCT 41.9 04/17/2024   PLT 368.0 04/17/2024   GLUCOSE 93  12/20/2023   CHOL 225 (H) 12/20/2023   TRIG 94.0 12/20/2023   HDL 62.70 12/20/2023   LDLCALC 143 (H) 12/20/2023   ALT 9 12/20/2023   AST 14 12/20/2023   NA 140 12/20/2023   K 4.8 12/20/2023   CL 102 12/20/2023   CREATININE 0.74 12/20/2023   BUN 27 (H) 12/20/2023   CO2 31 12/20/2023   TSH 1.04 12/20/2023   INR 0.99 08/19/2015   HGBA1C 5.1 12/20/2023    Lab Results  Component Value Date   TSH 1.04 12/20/2023   Lab Results  Component Value Date   WBC 8.2 04/17/2024   HGB 13.7 04/17/2024   HCT 41.9 04/17/2024   MCV 92.9 04/17/2024   PLT 368.0 04/17/2024   Lab Results  Component Value Date   NA 140 12/20/2023   K 4.8 12/20/2023   CHLORIDE 103 12/21/2016   CO2 31 12/20/2023   GLUCOSE 93 12/20/2023   BUN 27 (H) 12/20/2023   CREATININE 0.74 12/20/2023   BILITOT 0.6 12/20/2023   ALKPHOS 83 12/20/2023   AST 14 12/20/2023   ALT 9 12/20/2023   PROT 6.3 12/20/2023   ALBUMIN 4.1 12/20/2023   CALCIUM  9.5 12/20/2023   ANIONGAP 7 06/19/2023   EGFR 92 11/28/2023   GFR 80.24 12/20/2023   Lab Results  Component Value Date   CHOL 225 (H) 12/20/2023   Lab Results  Component Value Date   HDL 62.70 12/20/2023   Lab Results  Component Value Date   LDLCALC 143 (H) 12/20/2023   Lab Results  Component Value Date   TRIG 94.0 12/20/2023   Lab Results  Component Value Date   CHOLHDL 4 12/20/2023   Lab Results  Component Value Date   HGBA1C 5.1 12/20/2023       Assessment & Plan:  Closed fracture of nasal bone with  routine healing, subsequent encounter -     Ambulatory referral to ENT; Future  Closed fracture of facial bone with routine healing, unspecified facial bone, subsequent encounter -     Ambulatory referral to ENT; Future -     COMPLETE METABOLIC PANEL WITHOUT GFR; Future  Hypothyroidism, unspecified type Assessment & Plan: On Levothyroxine , continue to monitor, can be a risk factor for falls so will check TSH today  Orders: -     TSH; Future  Iron  deficiency anemia secondary to inadequate dietary iron intake -     CBC with Differential/Platelet; Future  Essential hypertension Assessment & Plan: Well controlled, no changes to meds. Encouraged heart healthy diet such as the DASH diet and exercise as tolerated.    Other orders -     tiZANidine  HCl; Take 0.5-2 tablets (1-4 mg total) by mouth every 8 (eight) hours as needed for muscle spasms.  Dispense: 30 tablet; Refill: 1 -     traMADol  HCl; Take 1-2 tablets (50-100 mg total) by mouth every 8 (eight) hours as needed for up to 11 doses. Max daily amount: 300mg .  Dispense: 90 tablet; Refill: 0    Assessment and Plan Assessment & Plan Facial trauma with possible nasal fracture Sustained facial trauma with a possible nasal fracture after a fall. CT scan suggests an old fracture, but ER doctor suspects an acute fracture. No acute fracture noted on CT. - Refer to ENT for further evaluation of nasal fracture. - Advise to avoid blowing nose to prevent air embolism or facial swelling.  Neck pain and stiffness post-fall Neck pain and stiffness following a fall, likely due to muscle strain. Symptoms expected to worsen over the next few days before improving. No point tenderness indicating fracture. - Prescribe tizanidine  2 mg for muscle relaxation, to be taken as needed. - Advise on use of Tylenol  for pain management.  Left elbow and arm contusion and muscle soreness post-fall Left elbow and arm contusion with muscle soreness following a fall. No x-ray performed as there is no indication of fracture. Pain is manageable and expected to increase over the next few days before improving. - Consider use of Tylenol  for pain management.  Severe right shoulder osteoarthritis Severe osteoarthritis in the right shoulder, previously evaluated by an orthopedic specialist. Baseline pain with no significant increase post-fall. She is considering surgical options due to deterioration of shoulder joint. -  Follow up with orthopedic surgeon for potential surgical intervention.  Hypothyroidism No new symptoms or concerns related to hypothyroidism discussed during this visit.  Macrocytic anemia No new symptoms or concerns related to macrocytic anemia discussed during this visit.  Recording duration: 41 minutes     Harlene Horton, MD

## 2024-04-19 LAB — COMPLETE METABOLIC PANEL WITHOUT GFR
AG Ratio: 1.8 (calc) (ref 1.0–2.5)
ALT: 13 U/L (ref 6–29)
AST: 13 U/L (ref 10–35)
Albumin: 4.4 g/dL (ref 3.6–5.1)
Alkaline phosphatase (APISO): 95 U/L (ref 37–153)
BUN/Creatinine Ratio: 34 (calc) — ABNORMAL HIGH (ref 6–22)
BUN: 26 mg/dL — ABNORMAL HIGH (ref 7–25)
CO2: 30 mmol/L (ref 20–32)
Calcium: 9.4 mg/dL (ref 8.6–10.4)
Chloride: 103 mmol/L (ref 98–110)
Creat: 0.77 mg/dL (ref 0.60–1.00)
Globulin: 2.5 g/dL (ref 1.9–3.7)
Glucose, Bld: 107 mg/dL — ABNORMAL HIGH (ref 65–99)
Potassium: 4.5 mmol/L (ref 3.5–5.3)
Sodium: 141 mmol/L (ref 135–146)
Total Bilirubin: 0.3 mg/dL (ref 0.2–1.2)
Total Protein: 6.9 g/dL (ref 6.1–8.1)

## 2024-04-21 NOTE — Progress Notes (Signed)
 Patient reviewed via MyChart.

## 2024-04-24 ENCOUNTER — Encounter: Payer: Self-pay | Admitting: Family Medicine

## 2024-04-28 ENCOUNTER — Ambulatory Visit (HOSPITAL_BASED_OUTPATIENT_CLINIC_OR_DEPARTMENT_OTHER): Attending: Family Medicine | Admitting: Physical Therapy

## 2024-04-28 ENCOUNTER — Encounter (HOSPITAL_BASED_OUTPATIENT_CLINIC_OR_DEPARTMENT_OTHER): Payer: Self-pay | Admitting: Physical Therapy

## 2024-04-28 DIAGNOSIS — M19041 Primary osteoarthritis, right hand: Secondary | ICD-10-CM | POA: Diagnosis not present

## 2024-04-28 DIAGNOSIS — M5459 Other low back pain: Secondary | ICD-10-CM | POA: Diagnosis not present

## 2024-04-28 DIAGNOSIS — M79645 Pain in left finger(s): Secondary | ICD-10-CM | POA: Diagnosis not present

## 2024-04-28 DIAGNOSIS — M6281 Muscle weakness (generalized): Secondary | ICD-10-CM | POA: Diagnosis not present

## 2024-04-28 DIAGNOSIS — M19042 Primary osteoarthritis, left hand: Secondary | ICD-10-CM | POA: Diagnosis not present

## 2024-04-28 DIAGNOSIS — M79644 Pain in right finger(s): Secondary | ICD-10-CM | POA: Insufficient documentation

## 2024-04-28 DIAGNOSIS — R2689 Other abnormalities of gait and mobility: Secondary | ICD-10-CM | POA: Insufficient documentation
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
## 2024-04-29 ENCOUNTER — Encounter (INDEPENDENT_AMBULATORY_CARE_PROVIDER_SITE_OTHER): Payer: Self-pay | Admitting: Physician Assistant

## 2024-04-29 ENCOUNTER — Ambulatory Visit (INDEPENDENT_AMBULATORY_CARE_PROVIDER_SITE_OTHER): Admitting: Physician Assistant

## 2024-04-29 VITALS — BP 127/77 | HR 63 | Temp 97.4°F | Ht 60.1 in | Wt 198.0 lb

## 2024-04-29 DIAGNOSIS — W19XXXA Unspecified fall, initial encounter: Secondary | ICD-10-CM

## 2024-04-29 DIAGNOSIS — J349 Unspecified disorder of nose and nasal sinuses: Secondary | ICD-10-CM

## 2024-04-29 DIAGNOSIS — S022XXA Fracture of nasal bones, initial encounter for closed fracture: Secondary | ICD-10-CM

## 2024-04-29 NOTE — Patient Instructions (Signed)
 Please call [facility] and request that they share the images from your recent ER visit with us  at [facility]. Sometimes, they can push it to PACS. Sometime they may need to burn the CD and you can bring it to me.

## 2024-04-29 NOTE — Progress Notes (Signed)
 Dear Dr. Domenica, Here is my assessment for our mutual patient, Rebecca Brooks. Thank you for allowing me the opportunity to care for your patient. Please do not hesitate to contact me should you have any other questions. Sincerely, Chyrl Cohen PA-C  Otolaryngology Clinic Note Referring provider: Dr. Domenica HPI:  Rebecca Brooks is a 73 y.o. female kindly referred by Dr. Domenica   The patient is a 73 year old female presenting today for evaluation of nasal bone fracture.  The patient notes that on 04/15/2024 she had a mechanical fall.  She struck her face.  She noted the pain to the nose with epistaxis.  She was seen at [facility] where she had CT maxillofacial which was reported as no acute facial fracture identified with probable old bilateral nasal bone fractures.  The patient denies any history of facial trauma previously.  She notes prior to this she had no significant nasal obstruction.  Today she notes some obstruction out of the right nose.  She denies any cosmetic deformity.  She denies any other injuries from the fall.   Independent Review of Additional Tests or Records:  ER visit on 04/15/2024 CT maxillofacial 04/15/2024-CT FACIAL BONES  1. No acute facial fracture identified.  2. Probable old bilateral nasal bone fractures. 3. No focal soft tissue swelling identified. 4. Acute on chronic sinus disease.    PMH/Meds/All/SocHx/FamHx/ROS:   Past Medical History:  Diagnosis Date   Abnormal nuclear stress test    False positive. Cath 08/23/15 showed Angiographically normal coronary arteries   Anemia    Ankle fracture    Arthritis    Asthma    asthmatic bronchitis   Atrial fibrillation (HCC)    Back pain    Chicken pox as a child   Edema of both lower extremities    Essential hypertension 07/17/2011   ACEI d/c 06/2011 for refractory cough> resolved    GERD (gastroesophageal reflux disease)    Hyperlipidemia    Hypertension    Hypothyroidism    Insomnia 11/01/2014   Insulin   resistance    pt reports she was told this in the past, does not take any medication for this   Joint pain    Low back pain radiating to right leg    intermittent and occasional numbness of skin over right hip in certain positions   Measles as a child   Mumps as a child   Obesity    Osteoarthritis    Palpitation    Pneumonia    this October 2013 from which she has said inhailer   PONV (postoperative nausea and vomiting)    violent vomiting after surgery   Recurrent epistaxis 02/20/2012   RLS (restless legs syndrome) 11/01/2014   Skin cancer    Sleep apnea    cpap   SOB (shortness of breath) on exertion    Spinal stenosis    Vitamin B 12 deficiency    Vitamin D  deficiency      Past Surgical History:  Procedure Laterality Date   CARDIAC CATHETERIZATION N/A 08/23/2015   Procedure: Left Heart Cath and Coronary Angiography;  Surgeon: Ozell Fell, MD;  Location: [facility] INVASIVE CV LAB;  Service: Cardiovascular;  Laterality: N/A;   CATARACT EXTRACTION Bilateral    CESAREAN SECTION     X 3   GASTRECTOMY  12/2017   INGUINAL HERNIA REPAIR Right 73 yrs old   LUMBAR LAMINECTOMY/DECOMPRESSION MICRODISCECTOMY Bilateral 10/21/2021   Procedure: Laminectomy and Foraminotomy - bilateral - Lumbar Three-Lumbar Four - Lumbar Four- Lumbar Five;  Surgeon: Louis Shove, MD;  Location: [facility] OR;  Service: Neurosurgery;  Laterality: Bilateral;   THYROIDECTOMY     total for benign tumor, Parathyroid spared   TONSILLECTOMY     TOTAL HIP ARTHROPLASTY Right 2006   secondary to congenital  hip defect   TOTAL KNEE ARTHROPLASTY Left 01/22/2023   Procedure: LEFT TOTAL KNEE ARTHROPLASTY;  Surgeon: Melodi Lerner, MD;  Location: [facility] ORS;  Service: Orthopedics;  Laterality: Left;   TOTAL KNEE ARTHROPLASTY Right 06/18/2023   Procedure: TOTAL KNEE ARTHROPLASTY;  Surgeon: Melodi Lerner, MD;  Location: [facility] ORS;  Service: Orthopedics;  Laterality: Right;    Family History  Problem Relation Age of Onset   Cancer  Mother    Heart failure Mother    Lung cancer Mother 67       smoker   Heart Problems Mother        tachycardia   Hearing loss Mother    Anxiety disorder Mother        anxiety, claustrophobia   Obesity Mother    Osteoarthritis Father    Hyperlipidemia Father    Anuerysm Father        AAA   Sudden death Father    Hyperlipidemia Sister    Migraines Sister    Hypertension Sister    Heart Problems Sister        tachycardia   Colon cancer Maternal Grandmother    Heart attack Maternal Grandfather    Aneurysm Paternal Grandfather        abdominal   Heart disease Paternal Grandfather        AAA rupture, smoker     Social Connections: Socially Integrated (02/13/2024)   Social Connection and Isolation Panel    Frequency of Communication with Friends and Family: Twice a week    Frequency of Social Gatherings with Friends and Family: Once a week    Attends Religious Services: More than 4 times per year    Active Member of [employer] or Organizations: Yes    Attends Engineer, structural: More than 4 times per year    Marital Status: Married      Current Outpatient Medications:    acetaminophen  (TYLENOL ) 650 MG CR tablet, Take 1,300 mg by mouth 2 (two) times daily., Disp: , Rfl:    albuterol  (VENTOLIN  HFA) 108 (90 Base) MCG/ACT inhaler, Inhale 1-2 puffs into the lungs every 6 (six) hours as needed for wheezing or shortness of breath., Disp: 1 each, Rfl: 0   apixaban  (ELIQUIS ) 5 MG TABS tablet, Take 1 tablet (5 mg total) by mouth 2 (two) times daily., Disp: 180 tablet, Rfl: 1   benzonatate  (TESSALON ) 200 MG capsule, Take 1 capsule (200 mg total) by mouth 3 (three) times daily as needed for cough. (Patient taking differently: Take 200 mg by mouth as needed for cough.), Disp: 30 capsule, Rfl: 0   cetirizine  (ZYRTEC ) 10 MG tablet, Take 1 tablet (10 mg total) by mouth daily., Disp: 90 tablet, Rfl: 1   clobetasol  ointment (TEMOVATE ) 0.05 %, Apply to the affected area(s) topically 2 (two)  times daily as needed., Disp: 30 g, Rfl: 1   Diclofenac  Sodium 3 % GEL, Apply 1 Application topically 3 (three) times daily as needed., Disp: 100 g, Rfl: 1   famotidine  (PEPCID ) 20 MG tablet, TAKE 1 TABLET(20 MG) BY MOUTH AT BEDTIME, Disp: 90 tablet, Rfl: 3   fluticasone  (FLONASE ) 50 MCG/ACT nasal spray, SHAKE LIQUID AND USE 2 SPRAYS IN EACH NOSTRIL DAILY, Disp: 48 g, Rfl:  0   furosemide  (LASIX ) 40 MG tablet, Take 1 tablet (40 mg total) by mouth daily., Disp: 90 tablet, Rfl: 3   levothyroxine  (SYNTHROID ) 100 MCG tablet, Take 1 tablet (100 mcg total) by mouth daily before breakfast., Disp: 90 tablet, Rfl: 1   metoprolol  succinate (TOPROL -XL) 50 MG 24 hr tablet, Take 1.5 tablets (75 mg total) by mouth in the morning and at bedtime. Take with or immediately following a meal., Disp: 270 tablet, Rfl: 3   ondansetron  (ZOFRAN ) 4 MG tablet, Take 1 tablet (4 mg total) by mouth every 6 (six) hours as needed for nausea. (Patient taking differently: Take 4 mg by mouth as needed for nausea.), Disp: 20 tablet, Rfl: 0   Oxymetazoline HCl (NASAL SPRAY) 0.05 % SOLN, two sprays by Both Nostrils route 2 (two) times a day as needed (for nasal bleeding)., Disp: 6 mL, Rfl: 0   pantoprazole  (PROTONIX ) 40 MG tablet, Take 1 tablet (40 mg total) by mouth daily., Disp: 90 tablet, Rfl: 3   rOPINIRole  (REQUIP ) 2 MG tablet, Take 1 tablet (2 mg total) by mouth daily., Disp: 30 tablet, Rfl: 5   sodium chloride  (OCEAN) 0.65 % nasal spray, Place 1 spray into the nose daily as needed for congestion, Disp: 44 mL, Rfl: 0   tiZANidine  (ZANAFLEX ) 2 MG tablet, Take 0.5-2 tablets (1-4 mg total) by mouth every 8 (eight) hours as needed for muscle spasms., Disp: 30 tablet, Rfl: 1   traMADol  (ULTRAM ) 50 MG tablet, Take 1-2 tablets (50-100 mg total) by mouth every 8 (eight) hours as needed for up to 11 doses. Max daily amount: 300mg ., Disp: 90 tablet, Rfl: 0   Vitamin D3 (VITAMIN D ) 25 MCG tablet, Take 1 tablet (1,000 Units total) by mouth  daily., Disp: 60 tablet, Rfl: 1   Physical Exam:   There were no vitals taken for this visit.   CN II-12 intact Facial sensation intact  Pupils equal round and reactive to light, no afferent pupillary defect; vision acuity normal for patient, no double vision  No chemosis or conjunctival hemorrhage  No widened intercanthal  distance  No enophthalmus, proptosis or dystopia  No periocular ecchymosis  Extraocular movement are intact and pain free No orbital emphysema, eyelids are soft with no decreased retropulsion Palpation of the face tenderness with palpation of the nasal bridge, no crepitus, and/or step-off on palpation of the malar eminences, zygomatic arches, or orbital rims  Left septal deviation, mild bilateral turbinate hypertrophy, no septal hematoma No tenderness with palpation of the TMJ or jaw, normal occlusion     Seprately Identifiable Procedures:  None  Impression & Plans:  Rebecca Brooks is a 73 y.o. female with the following   Suspected nasal bone fracture-  Patient presented today for evaluation of nasal bone fracture.  She denies any preceding trauma to the face prior to this fall.  The radiology report shows likely old nasal fractures.  Patient does note some obstructive symptoms on the right.  I would need to review the CT maxillofacial to provide any further recommendations.  The patient will have this information sent over the office so I can review it.  This fall was approximately 2 weeks ago.  As soon as she is able to get the information to me I will review it and provide recommendations moving forward   - f/u phone call discussion with CT results.   Thank you for allowing me the opportunity to care for your patient. Please do not hesitate to contact me should  you have any other questions.  Sincerely, Chyrl Cohen PA-C [facility] Phone: [phone] Fax: [phone]  04/29/2024, 10:45 AM

## 2024-04-30 ENCOUNTER — Ambulatory Visit (HOSPITAL_BASED_OUTPATIENT_CLINIC_OR_DEPARTMENT_OTHER): Admitting: Physical Therapy

## 2024-05-02 ENCOUNTER — Other Ambulatory Visit (HOSPITAL_BASED_OUTPATIENT_CLINIC_OR_DEPARTMENT_OTHER): Payer: Self-pay

## 2024-05-02 ENCOUNTER — Encounter: Payer: Self-pay | Admitting: Family Medicine

## 2024-05-02 MED ORDER — FLUTICASONE PROPIONATE 50 MCG/ACT NA SUSP
2.0000 | Freq: Every day | NASAL | 1 refills | Status: DC
  Filled 2024-05-02: qty 48, 90d supply, fill #0
  Filled 2024-06-24: qty 48, 90d supply, fill #1

## 2024-05-05 ENCOUNTER — Telehealth (INDEPENDENT_AMBULATORY_CARE_PROVIDER_SITE_OTHER): Payer: Self-pay

## 2024-05-05 NOTE — Telephone Encounter (Signed)
 Can you let her know that they are still not available, this is the second time they have tried to get them over to PACS but they are still not making it. She will need to have them burn a CD.

## 2024-05-06 ENCOUNTER — Encounter (INDEPENDENT_AMBULATORY_CARE_PROVIDER_SITE_OTHER): Payer: Self-pay

## 2024-05-06 NOTE — Telephone Encounter (Signed)
 LVM informing patient of providers recommendation.

## 2024-05-07 ENCOUNTER — Other Ambulatory Visit: Payer: Self-pay | Admitting: Orthopedic Surgery

## 2024-05-07 DIAGNOSIS — M25511 Pain in right shoulder: Secondary | ICD-10-CM | POA: Diagnosis not present

## 2024-05-07 DIAGNOSIS — G8929 Other chronic pain: Secondary | ICD-10-CM

## 2024-05-09 ENCOUNTER — Telehealth (INDEPENDENT_AMBULATORY_CARE_PROVIDER_SITE_OTHER): Payer: Self-pay | Admitting: Physician Assistant

## 2024-05-09 DIAGNOSIS — S022XXA Fracture of nasal bones, initial encounter for closed fracture: Secondary | ICD-10-CM

## 2024-05-09 NOTE — Telephone Encounter (Signed)
 The patient was able to bring over her CT maxillofacial for our review.  No significant nasal bone fractures were noted but she had significant septal fracture with right sided deviation.  This is the likely source of her ongoing symptoms.  She has been using daily Flonase  and was using this prior.  She notes persistent right sided nasal obstruction.  I discussed treatment options including continued conservative course versus septoplasty.Risks of septoplasty were discussed with the patient, including but not limited to: bleeding, infection, septal hematoma, septal perforation, persistent nasal obstruction, alteration in sense of smell, changes in nasal shape, scarring, adhesion formation, anesthesia risks, and the potential need for revision surgery. The patient verbalized understanding and agreed to proceed.  I discussed case with Dr. Mila who agrees surgical intervention is indicated.  The patient would like to meet Dr. Mila prior to surgery, we will proceed with surgical scheduling.  The patient is on Eliquis , clearance form for holding Eliquis  3 days prior and 1 day postsurgery has been sent to her cardiologist.  Patient verbalized understanding and agreement to today's plan had no further questions or concerns.

## 2024-05-12 ENCOUNTER — Telehealth (HOSPITAL_BASED_OUTPATIENT_CLINIC_OR_DEPARTMENT_OTHER): Payer: Self-pay

## 2024-05-12 NOTE — Telephone Encounter (Signed)
   Pre-operative Risk Assessment    Patient Name: Rebecca Brooks  DOB: December 24, 1950 MRN: [id number]   Date of last office visit: 04/03/24 with Dr. Cindie Date of next office visit: NA   Request for Surgical Clearance    Procedure:  Septoplasty  Date of Surgery:  Clearance TBD                                Surgeon:  Dr. Mila Socks Group or Practice Name:  [facility] Phone number:  [phone] Fax number:  [phone]   Type of Clearance Requested:   - Medical  - Pharmacy:  Hold Apixaban  (Eliquis ) 3 days prior and 1 day post   Type of Anesthesia:  General    Additional requests/questions:    Bonney Augustin JONETTA Delores   05/12/2024, 2:59 PM

## 2024-05-14 ENCOUNTER — Ambulatory Visit
Admission: RE | Admit: 2024-05-14 | Discharge: 2024-05-14 | Disposition: A | Discharge status: Home / Self Care | Source: Ambulatory Visit | Attending: Orthopedic Surgery | Admitting: Orthopedic Surgery

## 2024-05-14 DIAGNOSIS — G8929 Other chronic pain: Secondary | ICD-10-CM

## 2024-05-14 DIAGNOSIS — M19011 Primary osteoarthritis, right shoulder: Secondary | ICD-10-CM | POA: Diagnosis not present

## 2024-05-15 ENCOUNTER — Encounter: Payer: Self-pay | Admitting: Cardiology

## 2024-05-15 ENCOUNTER — Telehealth: Payer: Self-pay | Admitting: Cardiology

## 2024-05-15 NOTE — Progress Notes (Unsigned)
 Cardiology Office Note:  .   Date:  05/16/2024  ID:  Rebecca Brooks, DOB 06/04/1951, MRN [id number] PCP: Domenica Harlene LABOR, MD  [facility] Providers Cardiologist:  Darryle ONEIDA Decent, MD Electrophysiologist:  OLE ONEIDA HOLTS, MD {  History of Present Illness: .    Chief Complaint  Patient presents with   Follow-up    Rebecca Brooks is a 73 y.o. female with history of pAF who presents for follow-up.    History of Present Illness   Rebecca Brooks is a 73 year old female with paroxysmal atrial fibrillation and hypertension who presents for follow-up after an episode of AFib.  She experienced an episode of atrial fibrillation that began around 4:30 AM and lasted until approximately 11:00 AM. During this episode, she had tightness and pressure in her chest, as well as shortness of breath. She did not initially feel her heart racing but confirmed the AFib episode using her watch. She took her metoprolol  later in the morning and did not take nitroglycerin  or diltiazem , despite having them available.  She has not had an AFib episode in a long time, with the last known episode occurring before her visit to [facility] in September.  She mentions a history of a fall on September 23rd, which resulted in a CT scan due to her use of blood thinners. She reports that she was told her septum is broken and that surgery was recommended.  She is currently on Eliquis  and metoprolol , taking 75 mg daily, which she finds difficult to cut. She inquires about adjusting her metoprolol  dosage to avoid cutting the pills.  She has a history of sleep apnea and uses a CPAP machine, although she reports some difficulty breathing through her right nostril. She attributes some chest pain to stress, particularly during arguments, but does not believe it is heart-related.  No fever, chills, cough, or congestion. Reports chest discomfort and shortness of breath during AFib episodes. No recent  significant changes in medical history.          Problem List Paroxysmal Atrial Fibrillation  -CHADSVASC=3 (age, HTN, female) -Dx 04/2022 -LHC 2017 normal 2. HTN 3. Obesity 4. OSA    ROS: All other ROS reviewed and negative. Pertinent positives noted in the HPI.     Studies Reviewed: SABRA       TTE 04/10/2022  1. Left ventricular ejection fraction, by estimation, is 60 to 65%. The  left ventricle has normal function. The left ventricle has no regional  wall motion abnormalities. Left ventricular diastolic parameters were  normal.   2. Right ventricular systolic function is normal. The right ventricular  size is normal. There is normal pulmonary artery systolic pressure.   3. Left atrial size was mildly dilated.   4. The mitral valve is normal in structure. No evidence of mitral valve  regurgitation. No evidence of mitral stenosis.   5. The aortic valve is tricuspid. There is mild calcification of the  aortic valve. Aortic valve regurgitation is not visualized. Aortic valve  sclerosis is present, with no evidence of aortic valve stenosis.   6. The inferior vena cava is normal in size with greater than 50%  respiratory variability, suggesting right atrial pressure of 3 mmHg.  Physical Exam:   VS:  BP 126/76   Pulse 70   Ht 5' 0.75 (1.543 m)   Wt 199 lb (90.3 kg)   SpO2 97%   BMI 37.91 kg/m    Wt Readings from Last 3  Encounters:  05/16/24 199 lb (90.3 kg)  04/29/24 198 lb (89.8 kg)  04/17/24 200 lb 3.2 oz (90.8 kg)    GEN: Well nourished, well developed in no acute distress NECK: No JVD; No carotid bruits CARDIAC: RRR, no murmurs, rubs, gallops RESPIRATORY:  Clear to auscultation without rales, wheezing or rhonchi  ABDOMEN: Soft, non-tender, non-distended EXTREMITIES:  No edema; No deformity  ASSESSMENT AND PLAN: .   Assessment and Plan    Paroxysmal atrial fibrillation Recent AFib episode with chest discomfort and shortness of breath. Heart rate controlled on  current medication. Stable and infrequent episodes warrant continuation of current management. - Order coronary CT angiography to exclude obstructive coronary artery disease. - Change metoprolol  to 50 mg twice daily. - Monitor AFib episodes and adjust treatment if episodes become more frequent. - Hold Eliquis  two days before septal surgery and restart post-surgery as advised.   Chest pain - tightness with Afib. CCTA ordered today.   Obstructive sleep apnea Using CPAP with difficulty due to nasal obstruction. Discussed impact on AFib and importance of effective management.  Nasal septal deviation with planned septal surgery Nasal septal deviation requires surgical correction to improve airflow and potentially reduce AFib episodes related to sleep apnea. Discussed surgical procedure and recovery expectations. - Proceed with septal surgery after clearance from coronary CT angiography. - Hold Eliquis  two days before surgery and restart post-surgery as advised.  Essential hypertension - Continue metoprolol  100 mg twice daily.              Follow-up: Return in about 6 months (around 11/14/2024).  Signed, Darryle DASEN. Barbaraann, MD, [facility]  [facility]  [address] [place], [place] [address] [phone]  2:25 PM

## 2024-05-15 NOTE — Telephone Encounter (Signed)
 Patient identification verified by 2 forms.   Called and spoke to patient  Patient states:  -Woken up by chest discomfort/tightness/pressure/SOB at that time -HR 80-90, normally in 70s -Felt palpations  -Last episode of palpations was about 6 months ago -Has been taking medication as prescribed.   Patient denies:  -Currently have symptoms  -Going to ED because symptoms went away.  Interventions/Plan: -Will get message to Dr. Barbaraann and Dr. Cindie  Reviewed ED warning signs/precautions  Patient agrees with plan, no questions at this time

## 2024-05-15 NOTE — Telephone Encounter (Signed)
 Patient c/o Palpitations:  STAT if patient reporting lightheadedness, shortness of breath, or chest pain  How long have you had palpitations/irregular HR/ Afib? Are you having the symptoms now?  Since 4:30AM. Still in afib   Are you currently experiencing lightheadedness, SOB or CP? Pt had some sob and chest pain this morning.   Do you have a history of afib (atrial fibrillation) or irregular heart rhythm? Yes   Have you checked your BP or HR? (document readings if available): Pt has watch readings   Are you experiencing any other symptoms? Fluttering, tight, heavy chest    Pt has been in afib since 4:30 this morning. Please advise.

## 2024-05-15 NOTE — Telephone Encounter (Signed)
 Rebecca Darryle Ned, MD to [facility] T[facility]  [place] Marney L, RN  Cindie Ole DASEN, MD  Magdaline Salles, RN  (Selected Message)     05/15/24  9:54 AM Have her see me tomorrow. Overbook ok.    Signed, Darryle DASEN. Barbaraann, MD, [facility]  [facility]  [address] [place], [place] [address] [phone]  9:54 AM  Spoke with pt regarding appointment for tomorrow. Pt is scheduled to see Dr. Barbaraann tomorrow at 1pm. Pt verbalizes understanding.

## 2024-05-16 ENCOUNTER — Other Ambulatory Visit (HOSPITAL_COMMUNITY): Payer: Self-pay

## 2024-05-16 ENCOUNTER — Other Ambulatory Visit: Payer: Self-pay

## 2024-05-16 ENCOUNTER — Ambulatory Visit: Attending: Cardiovascular Disease | Admitting: Cardiovascular Disease

## 2024-05-16 ENCOUNTER — Other Ambulatory Visit (HOSPITAL_BASED_OUTPATIENT_CLINIC_OR_DEPARTMENT_OTHER): Payer: Self-pay

## 2024-05-16 ENCOUNTER — Encounter: Payer: Self-pay | Admitting: Cardiovascular Disease

## 2024-05-16 VITALS — BP 126/76 | HR 70 | Ht 60.75 in | Wt 199.0 lb

## 2024-05-16 DIAGNOSIS — I480 Paroxysmal atrial fibrillation: Secondary | ICD-10-CM

## 2024-05-16 DIAGNOSIS — I10 Essential (primary) hypertension: Secondary | ICD-10-CM | POA: Diagnosis not present

## 2024-05-16 DIAGNOSIS — R072 Precordial pain: Secondary | ICD-10-CM

## 2024-05-16 DIAGNOSIS — E782 Mixed hyperlipidemia: Secondary | ICD-10-CM

## 2024-05-16 DIAGNOSIS — Z01810 Encounter for preprocedural cardiovascular examination: Secondary | ICD-10-CM

## 2024-05-16 MED ORDER — METOPROLOL SUCCINATE ER 50 MG PO TB24
50.0000 mg | ORAL_TABLET | Freq: Two times a day (BID) | ORAL | 3 refills | Status: AC
  Filled 2024-05-16: qty 180, 90d supply, fill #0
  Filled 2024-08-20: qty 180, 90d supply, fill #1

## 2024-05-16 NOTE — Patient Instructions (Addendum)
 Medication Instructions:  Your physician has recommended you make the following change in your medication:  CHANGE the way you take your Toprol  to 50 mg TWICE daily  *If you need a refill on your cardiac medications before your next appointment, please call your pharmacy*  Lab Work: Today: BMET  If you have any lab test that is abnormal or we need to change your treatment, we will call you to review the results.  Testing/Procedures: Your physician has requested that you have cardiac CT. Cardiac computed tomography (CT) is a painless test that uses an x-ray machine to take clear, detailed pictures of your heart. For further information please visit [email or web address] Please follow instruction sheet as given.    Follow-Up: At [facility], you and your health needs are our priority.  As part of our continuing mission to provide you with exceptional heart care, our providers are all part of one team.  This team includes your primary Cardiologist (physician) and Advanced Practice Providers or APPs (Physician Assistants and Nurse Practitioners) who all work together to provide you with the care you need, when you need it.  Your next appointment:   6 month(s)  Provider:   Darryle ONEIDA Decent, MD     Thank you for choosing [facility]!!   [phone]   Other Instructions    Your cardiac CT will be scheduled at one of the below locations:   [facility] [address] [place], [place] [address] [phone] (Severe contrast allergies only)  OR   [facility] [address] [place], [place] [address] [phone]  OR   [facility] [address] [place], [place] [address] [phone]  OR   [facility] and Vascular Tower [address]  [place], [place] [address]  OR   [facility] [address] [place], [place] [phone]  If scheduled at [facility], please arrive  at the [facility] and Children's Entrance (Entrance C2) of [facility] 30 minutes prior to test start time. You can use the FREE valet parking offered at entrance C (encouraged to control the heart rate for the test)  Proceed to the [facility] Radiology Department (first floor) to check-in and test prep.  All radiology patients and guests should use entrance C2 at [facility], accessed from [facility], even though the hospital's physical address listed is [address].  If scheduled at the Heart and Vascular Tower at [address], please enter the parking lot using the [address] entrance and use the FREE valet service at the patient drop-off area. Enter the building and check-in with registration on the main floor.  If scheduled at [facility], please arrive to the Heart and Vascular Center 15 mins early for check-in and test prep.  There is spacious parking and easy access to the radiology department from the [facility] Heart and Vascular entrance. Please enter here and check-in with the desk attendant.   If scheduled at [facility], please arrive 30 minutes early for check-in and test prep.  Please follow these instructions carefully (unless otherwise directed):  An IV will be required for this test and Nitroglycerin  will be given.  Hold all erectile dysfunction medications at least 3 days (72 hrs) prior to test. (Ie viagra, cialis, sildenafil, tadalafil, etc)   On the Night Before the Test: Be sure to Drink plenty of water . Do not consume any caffeinated/decaffeinated beverages or  chocolate 12 hours prior to your test. Do not take any antihistamines 12 hours prior to your test.  If the patient has contrast allergy: Patient will need a prescription for Prednisone  and very clear instructions (as follows): Prednisone  50 mg - take 13 hours prior to test Take another Prednisone  50 mg 7 hours prior to test Take another  Prednisone  50 mg 1 hour prior to test Take Benadryl  50 mg 1 hour prior to test Patient must complete all four doses of above prophylactic medications. Patient will need a ride after test due to Benadryl .  On the Day of the Test: Drink plenty of water  until 1 hour prior to the test. Do not eat any food 1 hour prior to test. You may take your regular medications prior to the test.  If you take Furosemide /Hydrochlorothiazide /Spironolactone/Chlorthalidone , please HOLD on the morning of the test. Patients who wear a continuous glucose monitor MUST remove the device prior to scanning. FEMALES- please wear underwire-free bra if available, avoid dresses & tight clothing      After the Test: Drink plenty of water . After receiving IV contrast, you may experience a mild flushed feeling. This is normal. On occasion, you may experience a mild rash up to 24 hours after the test. This is not dangerous. If this occurs, you can take Benadryl  25 mg, Zyrtec , Claritin , or Allegra and increase your fluid intake. (Patients taking Tikosyn should avoid Benadryl , and may take Zyrtec , Claritin , or Allegra) If you experience trouble breathing, this can be serious. If it is severe call 911 IMMEDIATELY. If it is mild, please call our office.  We will call to schedule your test 2-4 weeks out understanding that some insurance companies will need an authorization prior to the service being performed.   For more information and frequently asked questions, please visit our website : [email or web address]  For non-scheduling related questions, please contact the cardiac imaging nurse navigator should you have any questions/concerns: Cardiac Imaging Nurse Navigators Direct Office Dial: [phone]   For scheduling needs, including cancellations and rescheduling, please call [place], [phone].

## 2024-05-17 ENCOUNTER — Other Ambulatory Visit (HOSPITAL_BASED_OUTPATIENT_CLINIC_OR_DEPARTMENT_OTHER): Payer: Self-pay

## 2024-05-17 LAB — BASIC METABOLIC PANEL WITH GFR
BUN/Creatinine Ratio: 37 — ABNORMAL HIGH (ref 12–28)
BUN: 26 mg/dL (ref 8–27)
CO2: 26 mmol/L (ref 20–29)
Calcium: 9.8 mg/dL (ref 8.7–10.3)
Chloride: 99 mmol/L (ref 96–106)
Creatinine, Ser: 0.71 mg/dL (ref 0.57–1.00)
Glucose: 88 mg/dL (ref 70–99)
Potassium: 4.6 mmol/L (ref 3.5–5.2)
Sodium: 140 mmol/L (ref 134–144)
eGFR: 90 mL/min/1.73 (ref >59)

## 2024-05-18 ENCOUNTER — Ambulatory Visit: Payer: Self-pay | Admitting: Cardiovascular Disease

## 2024-05-19 ENCOUNTER — Other Ambulatory Visit (HOSPITAL_BASED_OUTPATIENT_CLINIC_OR_DEPARTMENT_OTHER): Payer: Self-pay

## 2024-05-19 ENCOUNTER — Ambulatory Visit

## 2024-05-19 ENCOUNTER — Ambulatory Visit: Attending: Rheumatology

## 2024-05-19 ENCOUNTER — Ambulatory Visit (INDEPENDENT_AMBULATORY_CARE_PROVIDER_SITE_OTHER)

## 2024-05-19 ENCOUNTER — Ambulatory Visit (INDEPENDENT_AMBULATORY_CARE_PROVIDER_SITE_OTHER): Admitting: Rheumatology

## 2024-05-19 DIAGNOSIS — M79644 Pain in right finger(s): Secondary | ICD-10-CM

## 2024-05-19 DIAGNOSIS — M19041 Primary osteoarthritis, right hand: Secondary | ICD-10-CM

## 2024-05-19 DIAGNOSIS — M19042 Primary osteoarthritis, left hand: Secondary | ICD-10-CM

## 2024-05-19 DIAGNOSIS — M79645 Pain in left finger(s): Secondary | ICD-10-CM | POA: Diagnosis not present

## 2024-05-19 MED ORDER — LIDOCAINE HCL 1 % IJ SOLN
0.5000 mL | INTRAMUSCULAR | Status: AC | PRN
  Administered 2024-05-19: .5 mL

## 2024-05-19 MED ORDER — TRIAMCINOLONE ACETONIDE 40 MG/ML IJ SUSP
10.0000 mg | INTRAMUSCULAR | Status: AC | PRN
  Administered 2024-05-19: 10 mg via INTRA_ARTICULAR

## 2024-05-19 NOTE — Progress Notes (Signed)
   Procedure Note  Patient: Rebecca Brooks             Date of Birth: [birth year]           MRN: [id number]             Visit Date: 05/19/2024  Procedures: Visit Diagnoses:  1. Primary osteoarthritis of both hands   2. Pain in right finger(s)   3. Pain in left finger(s)     Small Joint Inj: bilateral ring PIP on 05/19/2024 10:42 AM Indications: pain and joint swelling Details: 27 G needle, ultrasound-guided dorsal approach  Spinal Needle: No  Medications (Right): 0.5 mL lidocaine  1 %; 10 mg triamcinolone  acetonide 40 MG/ML Aspirate (Right): 0 mL Medications (Left): 0.5 mL lidocaine  1 %; 10 mg triamcinolone  acetonide 40 MG/ML Aspirate (Left): 0 mL Outcome: tolerated well, no immediate complications  Risk of infection, tendon injury, nerve injury, hypopigmentation and dermal atrophy were discussed. Procedure, treatment alternatives, risks and benefits explained, specific risks discussed. Consent was given by the patient. Immediately prior to procedure a time out was called to verify the correct patient, procedure, equipment, support staff and site/side marked as required. Patient was prepped and draped in the usual sterile fashion.     Patient tolerated procedure well.  Postprocedure instructions were given.  Bilateral ring finger splints were applied.  Maya [place], MD

## 2024-05-21 NOTE — Telephone Encounter (Signed)
 Dr. Barbaraann,  You saw this patient on 05/16/2024. Per protocol we request that you comment on his cardiac risk to proceed with Septoplasty on TBD since it has been less than 2 months since evaluated in the office. Please send your comment to P CV Pre-Op Pool.  Pharmacy has weighed in on Eliquis .  Thank you, Lamarr Satterfield DNP, ANP, AACC.

## 2024-05-21 NOTE — Telephone Encounter (Signed)
 Patient with diagnosis of afib on Eliquis  for anticoagulation.    Procedure: Septoplasty  Date of procedure: TBD   CHA2DS2-VASc Score = 3   This indicates a 3.2% annual risk of stroke. The patient's score is based upon: CHF History: 0 HTN History: 1 Diabetes History: 0 Stroke History: 0 Vascular Disease History: 0 Age Score: 1 Gender Score: 1      CrCl 65 ml/min Platelet count 368  Patient has not had an Afib/aflutter ablation in the last 3 months, DCCV within the last 4 weeks or a watchman implanted in the last 45 days   Per office protocol, patient can hold Eliquis  for 3 days prior to procedure.    **This guidance is not considered finalized until pre-operative APP has relayed final recommendations.**

## 2024-05-26 ENCOUNTER — Ambulatory Visit (INDEPENDENT_AMBULATORY_CARE_PROVIDER_SITE_OTHER)

## 2024-05-26 ENCOUNTER — Encounter (INDEPENDENT_AMBULATORY_CARE_PROVIDER_SITE_OTHER): Payer: Self-pay

## 2024-05-26 VITALS — BP 127/76 | HR 54

## 2024-05-26 DIAGNOSIS — J342 Deviated nasal septum: Secondary | ICD-10-CM

## 2024-05-26 NOTE — Progress Notes (Signed)
 Patient is having BP managed. Took BP medication about an hour ago.

## 2024-05-26 NOTE — Progress Notes (Signed)
 HPI:   Discussed the use of AI scribe software for clinical note transcription with the patient, who gave verbal consent to proceed.  History of Present Illness Rebecca Brooks is a 73 year old female on blood thinners who presents with nasal trauma following a fall.  She experienced nasal trauma following a fall on April 15, 2024, when she fell forward while pulling a cart up a ramp, resulting in a face plant onto bricks. This led to significant epistaxis due to her use of blood thinners, described as 'like a faucet just pouring out.'  A head CT and maxillofacial CT scan were performed, showing no intracranial bleed. She has nasal congestion and difficulty breathing, particularly from the right nostril, with a sensation of congestion similar to having a cold. There is a persistent feeling of something being 'messed up' inside her nose, with occasional pain when pressure is applied to the tip of the nose.  She has been using Flonase  nightly and saline spray since the fall. Afrin was prescribed for potential epistaxis but has not been used since the fall as she has not had any nosebleeds. She also uses CPAP for sleep apnea, which has been complicated by nasal obstruction. Her cardiologist has advised maintaining CPAP use due to its relation to her atrial fibrillation. She is hoping CPAP tolerance may also improve with correction of her deviated septum.    PMH/Meds/All/SocHx/FamHx/ROS: Past Medical History:  Diagnosis Date   Abnormal nuclear stress test    False positive. Cath 08/23/15 showed Angiographically normal coronary arteries   Anemia    Ankle fracture    Arthritis    Asthma    asthmatic bronchitis   Atrial fibrillation (HCC)    Back pain    Chicken pox as a child   Edema of both lower extremities    Essential hypertension 07/17/2011   ACEI d/c 06/2011 for refractory cough> resolved    GERD (gastroesophageal reflux disease)    Hyperlipidemia    Hypertension     Hypothyroidism    Insomnia 11/01/2014   Insulin  resistance    pt reports she was told this in the past, does not take any medication for this   Joint pain    Low back pain radiating to right leg    intermittent and occasional numbness of skin over right hip in certain positions   Measles as a child   Mumps as a child   Obesity    Osteoarthritis    Palpitation    Pneumonia    this October 2013 from which she has said inhailer   PONV (postoperative nausea and vomiting)    violent vomiting after surgery   Recurrent epistaxis 02/20/2012   RLS (restless legs syndrome) 11/01/2014   Skin cancer    Sleep apnea    cpap   SOB (shortness of breath) on exertion    Spinal stenosis    Vitamin B 12 deficiency    Vitamin D  deficiency    Past Surgical History:  Procedure Laterality Date   CARDIAC CATHETERIZATION N/A 08/23/2015   Procedure: Left Heart Cath and Coronary Angiography;  Surgeon: Ozell Fell, MD;  Location: [facility] INVASIVE CV LAB;  Service: Cardiovascular;  Laterality: N/A;   CATARACT EXTRACTION Bilateral    CESAREAN SECTION     X 3   GASTRECTOMY  12/2017   INGUINAL HERNIA REPAIR Right 73 yrs old   LUMBAR LAMINECTOMY/DECOMPRESSION MICRODISCECTOMY Bilateral 10/21/2021   Procedure: Laminectomy and Foraminotomy - bilateral - Lumbar Three-Lumbar Four - Lumbar  Four- Lumbar Five;  Surgeon: Louis Shove, MD;  Location: [facility] OR;  Service: Neurosurgery;  Laterality: Bilateral;   THYROIDECTOMY     total for benign tumor, Parathyroid spared   TONSILLECTOMY     TOTAL HIP ARTHROPLASTY Right 2006   secondary to congenital  hip defect   TOTAL KNEE ARTHROPLASTY Left 01/22/2023   Procedure: LEFT TOTAL KNEE ARTHROPLASTY;  Surgeon: Melodi Lerner, MD;  Location: [facility] ORS;  Service: Orthopedics;  Laterality: Left;   TOTAL KNEE ARTHROPLASTY Right 06/18/2023   Procedure: TOTAL KNEE ARTHROPLASTY;  Surgeon: Melodi Lerner, MD;  Location: [facility] ORS;  Service: Orthopedics;  Laterality: Right;   No family  history of bleeding disorders, wound healing problems or difficulty with anesthesia.  Social Connections: Socially Integrated (02/13/2024)   Social Connection and Isolation Panel    Frequency of Communication with Friends and Family: Twice a week    Frequency of Social Gatherings with Friends and Family: Once a week    Attends Religious Services: More than 4 times per year    Active Member of [employer] or Organizations: Yes    Attends Engineer, Structural: More than 4 times per year    Marital Status: Married    Current Outpatient Medications:    acetaminophen  (TYLENOL ) 650 MG CR tablet, Take 1,300 mg by mouth 2 (two) times daily., Disp: , Rfl:    albuterol  (VENTOLIN  HFA) 108 (90 Base) MCG/ACT inhaler, Inhale 1-2 puffs into the lungs every 6 (six) hours as needed for wheezing or shortness of breath., Disp: 1 each, Rfl: 0   apixaban  (ELIQUIS ) 5 MG TABS tablet, Take 1 tablet (5 mg total) by mouth 2 (two) times daily., Disp: 180 tablet, Rfl: 1   benzonatate  (TESSALON ) 200 MG capsule, Take 1 capsule (200 mg total) by mouth 3 (three) times daily as needed for cough., Disp: 30 capsule, Rfl: 0   cetirizine  (ZYRTEC ) 10 MG tablet, Take 1 tablet (10 mg total) by mouth daily., Disp: 90 tablet, Rfl: 1   clobetasol  ointment (TEMOVATE ) 0.05 %, Apply to the affected area(s) topically 2 (two) times daily as needed., Disp: 30 g, Rfl: 1   Diclofenac  Sodium 3 % GEL, Apply 1 Application topically 3 (three) times daily as needed., Disp: 100 g, Rfl: 1   famotidine  (PEPCID ) 20 MG tablet, TAKE 1 TABLET(20 MG) BY MOUTH AT BEDTIME, Disp: 90 tablet, Rfl: 3   fluticasone  (FLONASE ) 50 MCG/ACT nasal spray, Place 2 sprays into both nostrils daily., Disp: 48 g, Rfl: 1   furosemide  (LASIX ) 40 MG tablet, Take 1 tablet (40 mg total) by mouth daily., Disp: 90 tablet, Rfl: 3   levothyroxine  (SYNTHROID ) 100 MCG tablet, Take 1 tablet (100 mcg total) by mouth daily before breakfast., Disp: 90 tablet, Rfl: 1   metoprolol   succinate (TOPROL -XL) 50 MG 24 hr tablet, Take 1 tablet (50 mg total) by mouth 2 (two) times daily. Take with or immediately following a meal., Disp: 180 tablet, Rfl: 3   ondansetron  (ZOFRAN ) 4 MG tablet, Take 1 tablet (4 mg total) by mouth every 6 (six) hours as needed for nausea., Disp: 20 tablet, Rfl: 0   pantoprazole  (PROTONIX ) 40 MG tablet, Take 1 tablet (40 mg total) by mouth daily., Disp: 90 tablet, Rfl: 3   rOPINIRole  (REQUIP ) 2 MG tablet, Take 1 tablet (2 mg total) by mouth daily., Disp: 30 tablet, Rfl: 5   sodium chloride  (OCEAN) 0.65 % nasal spray, Place 1 spray into the nose daily as needed for congestion, Disp: 44 mL, Rfl:  0   tiZANidine  (ZANAFLEX ) 2 MG tablet, Take 0.5-2 tablets (1-4 mg total) by mouth every 8 (eight) hours as needed for muscle spasms., Disp: 30 tablet, Rfl: 1   traMADol  (ULTRAM ) 50 MG tablet, Take 1-2 tablets (50-100 mg total) by mouth every 8 (eight) hours as needed for up to 11 doses. Max daily amount: 300mg ., Disp: 90 tablet, Rfl: 0   Vitamin D3 (VITAMIN D ) 25 MCG tablet, Take 1 tablet (1,000 Units total) by mouth daily., Disp: 60 tablet, Rfl: 1 A complete ROS was performed with pertinent positives/negatives noted in the HPI. The remainder of the ROS are negative.   Physical Exam:  BP 127/76   Pulse (!) 54   SpO2 93%  General: Well developed, well nourished. No acute distress. Voice normal Head/Face: Normocephalic. No sinus tenderness. Facial nerve intact and equal bilaterally. No facial lacerations. Eyes: PERRL, no scleral icterus or conjunctival hemorrhage. EOMI. Ears: No gross deformity. Normal external canal. Tympanic membrane in tact bilaterally Hearing: Normal speech reception.  Nose: No gross deformity or lesions. No purulent discharge. Bilateral inferior turbinate hypertrophy. Rightward nasal septal deviation  Mouth/Oropharynx: Lips without any lesions. Dentition good. No mucosal lesions within the oropharynx. No tonsillar enlargement, exudate, or  lesions. Pharyngeal walls symmetrical. Uvula midline. Tongue midline without lesions. Larynx: See TFL if applicable Nasopharynx: See TFL if applicable Neck: Trachea midline. No masses. No thyromegaly or nodules palpated. No crepitus. Lymphatic: No lymphadenopathy in the neck. Respiratory: No stridor or distress. Room air. Cardiovascular: Regular rate and rhythm. Extremities: No edema or cyanosis. Warm and well-perfused. Skin: No scars or lesions on face or neck. Neurologic: CN II-XII grossly intact. Moving all extremities without gross abnormality. Other:  Independent Review of Additional Tests or Records: None Procedures: None Impression & Plans:  Assessment and Plan Assessment & Plan Nasal septal deviation with impaired nasal breathing following nasal trauma - Continue Flonase  nightly. - Use nasal saline rinse daily - Avoid Afrin unless for epistaxis, limit to three days if used. - Plan for septoplasty with inferior turbinate reduction  Follow-up 1 week post-op  Adah Malkin, DO [facility]

## 2024-05-27 ENCOUNTER — Encounter (HOSPITAL_COMMUNITY): Payer: Self-pay

## 2024-05-29 ENCOUNTER — Ambulatory Visit (HOSPITAL_COMMUNITY)
Admission: RE | Admit: 2024-05-29 | Discharge: 2024-05-29 | Disposition: A | Discharge status: Home / Self Care | Source: Ambulatory Visit | Attending: Cardiovascular Disease | Admitting: Cardiovascular Disease

## 2024-05-29 DIAGNOSIS — R072 Precordial pain: Secondary | ICD-10-CM | POA: Insufficient documentation

## 2024-05-29 MED ORDER — IOHEXOL 350 MG/ML SOLN
100.0000 mL | Freq: Once | INTRAVENOUS | Status: AC | PRN
  Administered 2024-05-29: 100 mL via INTRAVENOUS

## 2024-05-29 MED ORDER — NITROGLYCERIN 0.4 MG SL SUBL
0.8000 mg | SUBLINGUAL_TABLET | Freq: Once | SUBLINGUAL | Status: AC
  Administered 2024-05-29: 0.8 mg via SUBLINGUAL

## 2024-05-30 NOTE — Telephone Encounter (Signed)
   Patient Name: Rebecca Brooks  DOB: 18-Jan-1951 MRN: [id number]  Primary Cardiologist: Darryle ONEIDA Decent, MD  Chart reviewed as part of pre-operative protocol coverage. Given past medical history and time since last visit, based on ACC/AHA guidelines.  Per Dr. Decent 05/16/2024 Proceed with septal surgery after clearance from coronary CT angiography. - Hold Eliquis  two days before surgery and restart post-surgery as advised.  Cardiac CT on 05/29/2024 Per Dr. Decent Normal coronary CTA.  Per Pharmacy 05/21/2024 Per office protocol, patient can hold Eliquis  for 3 days prior to procedure.     Rebecca Brooks is at acceptable risk for the planned procedure without further cardiovascular testing.   The patient was advised that if she develops new symptoms prior to surgery to contact our office to arrange for a follow-up visit, and she verbalized understanding.  I will route this recommendation to the requesting party via Epic fax function and remove from pre-op pool.  Please call with questions.  Lamarr Satterfield, NP 05/30/2024, 12:24 PM

## 2024-06-03 ENCOUNTER — Telehealth (INDEPENDENT_AMBULATORY_CARE_PROVIDER_SITE_OTHER): Payer: Self-pay

## 2024-06-03 DIAGNOSIS — M25511 Pain in right shoulder: Secondary | ICD-10-CM | POA: Diagnosis not present

## 2024-06-03 NOTE — Telephone Encounter (Signed)
 I LVM for pt that we got medical clearance for her for the surgery with Dr. Mila. She needs to hold her Eliquis  for 3 days before surgery. If she has any question, call the office # and ask for me.

## 2024-06-03 NOTE — Telephone Encounter (Signed)
 Per Dr. Barbaraann, Proceed w/septal surgery after Clearance from Coronary CT angiography, hold Eliquis  2 days before surgery and restart post-surgery as advised.

## 2024-06-06 ENCOUNTER — Emergency Department (HOSPITAL_BASED_OUTPATIENT_CLINIC_OR_DEPARTMENT_OTHER)

## 2024-06-06 ENCOUNTER — Other Ambulatory Visit: Payer: Self-pay

## 2024-06-06 ENCOUNTER — Emergency Department (HOSPITAL_BASED_OUTPATIENT_CLINIC_OR_DEPARTMENT_OTHER)
Admission: EM | Admit: 2024-06-06 | Discharge: 2024-06-06 | Disposition: A | Discharge status: Home / Self Care | Attending: Emergency Medicine | Admitting: Emergency Medicine

## 2024-06-06 DIAGNOSIS — I10 Essential (primary) hypertension: Secondary | ICD-10-CM | POA: Insufficient documentation

## 2024-06-06 DIAGNOSIS — Z7901 Long term (current) use of anticoagulants: Secondary | ICD-10-CM | POA: Diagnosis not present

## 2024-06-06 DIAGNOSIS — G44319 Acute post-traumatic headache, not intractable: Secondary | ICD-10-CM | POA: Insufficient documentation

## 2024-06-06 DIAGNOSIS — M4802 Spinal stenosis, cervical region: Secondary | ICD-10-CM | POA: Diagnosis not present

## 2024-06-06 DIAGNOSIS — R911 Solitary pulmonary nodule: Secondary | ICD-10-CM | POA: Insufficient documentation

## 2024-06-06 DIAGNOSIS — K449 Diaphragmatic hernia without obstruction or gangrene: Secondary | ICD-10-CM | POA: Insufficient documentation

## 2024-06-06 DIAGNOSIS — M545 Low back pain, unspecified: Secondary | ICD-10-CM | POA: Diagnosis not present

## 2024-06-06 DIAGNOSIS — S299XXA Unspecified injury of thorax, initial encounter: Secondary | ICD-10-CM | POA: Diagnosis not present

## 2024-06-06 DIAGNOSIS — I4891 Unspecified atrial fibrillation: Secondary | ICD-10-CM | POA: Diagnosis not present

## 2024-06-06 DIAGNOSIS — E041 Nontoxic single thyroid nodule: Secondary | ICD-10-CM | POA: Diagnosis not present

## 2024-06-06 DIAGNOSIS — G9520 Unspecified cord compression: Secondary | ICD-10-CM | POA: Diagnosis not present

## 2024-06-06 DIAGNOSIS — S0990XA Unspecified injury of head, initial encounter: Secondary | ICD-10-CM | POA: Diagnosis not present

## 2024-06-06 DIAGNOSIS — S3991XA Unspecified injury of abdomen, initial encounter: Secondary | ICD-10-CM | POA: Diagnosis not present

## 2024-06-06 DIAGNOSIS — Z79899 Other long term (current) drug therapy: Secondary | ICD-10-CM | POA: Diagnosis not present

## 2024-06-06 DIAGNOSIS — Y92410 Unspecified street and highway as the place of occurrence of the external cause: Secondary | ICD-10-CM | POA: Insufficient documentation

## 2024-06-06 DIAGNOSIS — I700 Atherosclerosis of aorta: Secondary | ICD-10-CM | POA: Insufficient documentation

## 2024-06-06 DIAGNOSIS — R42 Dizziness and giddiness: Secondary | ICD-10-CM | POA: Insufficient documentation

## 2024-06-06 DIAGNOSIS — S3993XA Unspecified injury of pelvis, initial encounter: Secondary | ICD-10-CM | POA: Diagnosis not present

## 2024-06-06 DIAGNOSIS — R519 Headache, unspecified: Secondary | ICD-10-CM | POA: Diagnosis not present

## 2024-06-06 DIAGNOSIS — S199XXA Unspecified injury of neck, initial encounter: Secondary | ICD-10-CM | POA: Diagnosis not present

## 2024-06-06 DIAGNOSIS — R9082 White matter disease, unspecified: Secondary | ICD-10-CM | POA: Diagnosis not present

## 2024-06-06 LAB — CBC WITH DIFFERENTIAL/PLATELET
Abs Immature Granulocytes: 0.01 K/uL (ref 0.00–0.07)
Basophils Absolute: 0.1 K/uL (ref 0.0–0.1)
Basophils Relative: 1 %
Eosinophils Absolute: 0.1 K/uL (ref 0.0–0.5)
Eosinophils Relative: 1 %
HCT: 41 % (ref 36.0–46.0)
Hemoglobin: 13.7 g/dL (ref 12.0–15.0)
Immature Granulocytes: 0 %
Lymphocytes Relative: 22 %
Lymphs Abs: 1.5 K/uL (ref 0.7–4.0)
MCH: 31.3 pg (ref 26.0–34.0)
MCHC: 33.4 g/dL (ref 30.0–36.0)
MCV: 93.6 fL (ref 80.0–100.0)
Monocytes Absolute: 0.6 K/uL (ref 0.1–1.0)
Monocytes Relative: 8 %
Neutro Abs: 4.6 K/uL (ref 1.7–7.7)
Neutrophils Relative %: 68 %
Platelets: 318 K/uL (ref 150–400)
RBC: 4.38 MIL/uL (ref 3.87–5.11)
RDW: 12.5 % (ref 11.5–15.5)
WBC: 6.7 K/uL (ref 4.0–10.5)
nRBC: 0 % (ref 0.0–0.2)

## 2024-06-06 LAB — URINALYSIS, ROUTINE W REFLEX MICROSCOPIC
Bilirubin Urine: NEGATIVE
Glucose, UA: NEGATIVE mg/dL
Hgb urine dipstick: NEGATIVE
Ketones, ur: NEGATIVE mg/dL
Leukocytes,Ua: NEGATIVE
Nitrite: NEGATIVE
Protein, ur: NEGATIVE mg/dL
Specific Gravity, Urine: 1.009 (ref 1.005–1.030)
pH: 5.5 (ref 5.0–8.0)

## 2024-06-06 LAB — BASIC METABOLIC PANEL WITH GFR
Anion gap: 13 (ref 5–15)
BUN: 28 mg/dL — ABNORMAL HIGH (ref 8–23)
CO2: 27 mmol/L (ref 22–32)
Calcium: 10 mg/dL (ref 8.9–10.3)
Chloride: 102 mmol/L (ref 98–111)
Creatinine, Ser: 0.8 mg/dL (ref 0.44–1.00)
GFR, Estimated: >60 mL/min (ref >60)
Glucose, Bld: 91 mg/dL (ref 70–99)
Potassium: 3.5 mmol/L (ref 3.5–5.1)
Sodium: 142 mmol/L (ref 135–145)

## 2024-06-06 MED ORDER — KETOROLAC TROMETHAMINE 15 MG/ML IJ SOLN
15.0000 mg | Freq: Once | INTRAMUSCULAR | Status: DC
  Administered 2024-06-06: 15 mg via INTRAVENOUS
  Filled 2024-06-06: qty 1

## 2024-06-06 MED ORDER — IOHEXOL 300 MG/ML  SOLN
100.0000 mL | Freq: Once | INTRAMUSCULAR | Status: AC | PRN
  Administered 2024-06-06: 100 mL via INTRAVENOUS

## 2024-06-06 MED ORDER — ACETAMINOPHEN 500 MG PO TABS
1000.0000 mg | ORAL_TABLET | Freq: Once | ORAL | Status: AC
  Administered 2024-06-06: 1000 mg via ORAL
  Filled 2024-06-06: qty 2

## 2024-06-06 MED ORDER — METHOCARBAMOL 500 MG PO TABS
500.0000 mg | ORAL_TABLET | Freq: Two times a day (BID) | ORAL | 0 refills | Status: AC
  Filled 2024-06-06: qty 20, 10d supply, fill #0
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
## 2024-06-07 ENCOUNTER — Other Ambulatory Visit (HOSPITAL_BASED_OUTPATIENT_CLINIC_OR_DEPARTMENT_OTHER): Payer: Self-pay

## 2024-06-07 ENCOUNTER — Other Ambulatory Visit: Payer: Self-pay | Admitting: Family Medicine

## 2024-06-09 ENCOUNTER — Other Ambulatory Visit: Payer: Self-pay

## 2024-06-09 ENCOUNTER — Other Ambulatory Visit (HOSPITAL_COMMUNITY): Payer: Self-pay

## 2024-06-09 MED ORDER — TRAMADOL HCL 50 MG PO TABS
50.0000 mg | ORAL_TABLET | Freq: Three times a day (TID) | ORAL | 0 refills | Status: DC | PRN
  Filled 2024-06-09 – 2024-06-10 (×2): qty 90, 15d supply, fill #0

## 2024-06-09 NOTE — Telephone Encounter (Signed)
 Requesting: tramadol  50mg   Contract: 10/31/22 UDS: 10/31/22 Last Visit: 04/17/24 Next Visit: 06/26/24 Last Refill: 04/17/24 #90 and 0RF   Please Advise

## 2024-06-10 ENCOUNTER — Other Ambulatory Visit (HOSPITAL_BASED_OUTPATIENT_CLINIC_OR_DEPARTMENT_OTHER): Payer: Self-pay

## 2024-06-10 ENCOUNTER — Other Ambulatory Visit (HOSPITAL_COMMUNITY): Payer: Self-pay

## 2024-06-10 DIAGNOSIS — L304 Erythema intertrigo: Secondary | ICD-10-CM | POA: Diagnosis not present

## 2024-06-10 DIAGNOSIS — Z85828 Personal history of other malignant neoplasm of skin: Secondary | ICD-10-CM | POA: Diagnosis not present

## 2024-06-10 DIAGNOSIS — L821 Other seborrheic keratosis: Secondary | ICD-10-CM | POA: Diagnosis not present

## 2024-06-10 DIAGNOSIS — L820 Inflamed seborrheic keratosis: Secondary | ICD-10-CM | POA: Diagnosis not present

## 2024-06-10 DIAGNOSIS — L578 Other skin changes due to chronic exposure to nonionizing radiation: Secondary | ICD-10-CM | POA: Diagnosis not present

## 2024-06-10 MED ORDER — NYSTATIN 100000 UNIT/GM EX CREA
1.0000 | TOPICAL_CREAM | Freq: Two times a day (BID) | CUTANEOUS | 0 refills | Status: AC
  Filled 2024-06-10: qty 15, 30d supply, fill #0
  Filled 2024-07-15: qty 45, 90d supply, fill #1

## 2024-06-10 MED ORDER — FLUCONAZOLE 150 MG PO TABS
150.0000 mg | ORAL_TABLET | Freq: Every day | ORAL | 0 refills | Status: DC
  Filled 2024-06-10: qty 1, 1d supply, fill #0

## 2024-06-11 DIAGNOSIS — M25511 Pain in right shoulder: Secondary | ICD-10-CM | POA: Diagnosis not present

## 2024-06-16 ENCOUNTER — Encounter (INDEPENDENT_AMBULATORY_CARE_PROVIDER_SITE_OTHER)

## 2024-06-24 ENCOUNTER — Ambulatory Visit (INDEPENDENT_AMBULATORY_CARE_PROVIDER_SITE_OTHER): Admitting: Otolaryngology

## 2024-06-24 VITALS — BP 110/76 | HR 72 | Temp 97.8°F | Ht 61.5 in | Wt 196.0 lb

## 2024-06-24 DIAGNOSIS — J310 Chronic rhinitis: Secondary | ICD-10-CM | POA: Insufficient documentation

## 2024-06-24 DIAGNOSIS — J342 Deviated nasal septum: Secondary | ICD-10-CM | POA: Insufficient documentation

## 2024-06-24 DIAGNOSIS — Z7901 Long term (current) use of anticoagulants: Secondary | ICD-10-CM

## 2024-06-24 DIAGNOSIS — J343 Hypertrophy of nasal turbinates: Secondary | ICD-10-CM | POA: Diagnosis not present

## 2024-06-24 NOTE — Progress Notes (Signed)
 [name], female   DOB: 05-22-1951, 73 y.o.   MRN: [id number]  Follow up: Chronic nasal obstruction  History of Present Illness Rebecca Brooks is a 73 year old female who returns today for follow-up evaluation of her chronic nasal obstruction.  The patient was previously seen by Dr. Adah Malkin.  She has a history of chronic nasal obstruction following a fall that resulted in nasal trauma and deviated septum. She experiences difficulty breathing through her nose. She has been using saline irrigation and Flonase  nasal spray nightly, but she continues to be symptomatic.  She has a history of atrial fibrillation and uses a CPAP machine for sleep apnea. She is on Eliquis , a blood thinner. She has concerns about her previous experiences with anesthesia, having woken up during past procedures, and plans to discuss this with the anesthesiologist.  Exam: General: Communicates without difficulty, well nourished, no acute distress. Head: Normocephalic, no evidence injury, no tenderness, facial buttresses intact without stepoff. Face/sinus: No tenderness to palpation and percussion. Facial movement is normal and symmetric. Eyes: PERRL, EOMI. No scleral icterus, conjunctivae clear. Neuro: CN II exam reveals vision grossly intact.  No nystagmus at any point of gaze. Ears: Auricles well formed without lesions.  Ear canals are intact without mass or lesion.  No erythema or edema is appreciated.  The TMs are intact without fluid. Nose: External evaluation reveals normal support and skin without lesions.  Dorsum is intact.  Anterior rhinoscopy reveals congested mucosa over anterior aspect of inferior turbinates and deviated septum.  No purulence noted. Oral:  Oral cavity and oropharynx are intact, symmetric, without erythema or edema.  Mucosa is moist without lesions. Neck: Full range of motion without pain.  There is no significant lymphadenopathy.  No masses palpable.  Thyroid  bed within  normal limits to palpation.  Parotid glands and submandibular glands equal bilaterally without mass.  Trachea is midline. Neuro:  CN 2-12 grossly intact.    Assessment and Plan Assessment & Plan Chronic nasal obstruction, secondary to nasal septal deviation and bilateral inferior turbinate hypertrophy. Chronic nasal obstruction due to deviated nasal septum and bilateral inferior turbinate hypertrophy.  More than 95% of her nasal passageways are obstructed.   - Refilled Flonase  prescription. - In light of her persistent nasal obstruction, she may benefit from surgical intervention with septoplasty and bilateral inferior turbinate reduction.  The risk, benefits, alternatives, and details of the procedures are extensively discussed.  Questions are invited and answered. - Instructed to stop Eliquis  three days before surgery. - Instructed to discuss anesthesia concerns with anesthesiologist on the day of surgery.

## 2024-06-25 ENCOUNTER — Encounter: Payer: Self-pay | Admitting: Family

## 2024-06-25 ENCOUNTER — Other Ambulatory Visit: Payer: Self-pay

## 2024-06-25 ENCOUNTER — Other Ambulatory Visit (HOSPITAL_BASED_OUTPATIENT_CLINIC_OR_DEPARTMENT_OTHER): Payer: Self-pay

## 2024-06-26 ENCOUNTER — Ambulatory Visit: Admitting: Family Medicine

## 2024-06-27 ENCOUNTER — Encounter: Payer: Self-pay | Admitting: Student

## 2024-06-27 DIAGNOSIS — E559 Vitamin D deficiency, unspecified: Secondary | ICD-10-CM | POA: Insufficient documentation

## 2024-06-27 NOTE — Assessment & Plan Note (Signed)
 Well controlled, no changes to meds. Encouraged heart healthy diet such as the DASH diet and exercise as tolerated.

## 2024-06-27 NOTE — Assessment & Plan Note (Signed)
 Asymptomatic. Increase leafy greens, consider increased lean red meat and using cast iron cookware. Continue to monitor, report any concerns

## 2024-06-27 NOTE — Progress Notes (Addendum)
 Subjective:     [name], female    DOB: 02-18-1951, 73 y.o.   MRN: [id number]  No chief complaint on file.   HPI  Discussed the use of AI scribe software for clinical note transcription with the patient, who gave verbal consent to proceed.  History of Present Illness Rebecca Brooks is a 73 year old female who presents for an annual physical exam.  She is scheduled for nasal surgery this week after a fall. She sleeps 7-8 hours nightly with CPAP. She is actively trying to lose weight. She previously reduced to 188 lb but has regained some and is working to lose again. She has restless leg syndrome controlled with Requip . Her symptoms and prior muscle pain improved after stopping simvastatin  and Crestor . She recently had a coronary CT with a CAC score of zero.  Follows with: Cardiology, ENT, rheumatology  PMHx- history of A-fib on Eliquis , GERD, HTN, HLD, OSA, DDD,   Hypothyroidism-Synthroid  100 mcg daily  GERD-Protonix  40 mg daily, famotidine  Pepcid  20 mg at bedtime  RLS-ropinirole  (Requip ) 2 mg daily  Chronic pain Tramadol  50 mg 1 to 2 tablets by mouth every 8 hours as needed  Patient denies fever, chills, SOB, CP, palpitations, dyspnea, edema, HA, vision changes, N/V/D, abdominal pain, urinary symptoms, rash, weight changes, and recent illness or hospitalizations.    History of Present Illness              Health Maintenance Due  Topic Date Due   Hepatitis C Screening  Never done   Pneumococcal Vaccine: 50+ Years (3 of 3 - PCV20 or PCV21) 05/15/2023   COVID-19 Vaccine (4 - 2025-26 season) 03/24/2024   Zoster Vaccines- Shingrix  (2 of 2) 03/28/2024    Past Medical History:  Diagnosis Date   Abnormal nuclear stress test    False positive. Cath 08/23/15 showed Angiographically normal coronary arteries   Anemia    Ankle fracture    Arthritis    Asthma    asthmatic bronchitis   Atrial fibrillation (HCC)    Back pain    Chicken  pox as a child   Edema of both lower extremities    Essential hypertension 07/17/2011   ACEI d/c 06/2011 for refractory cough> resolved    GERD (gastroesophageal reflux disease)    Hyperlipidemia    Hypertension    Hypothyroidism    Insomnia 11/01/2014   Insulin  resistance    pt reports she was told this in the past, does not take any medication for this   Joint pain    Low back pain radiating to right leg    intermittent and occasional numbness of skin over right hip in certain positions   Measles as a child   Mumps as a child   Obesity    Osteoarthritis    Palpitation    Pneumonia    this October 2013 from which she has said inhailer   PONV (postoperative nausea and vomiting)    violent vomiting after surgery   Recurrent epistaxis 02/20/2012   RLS (restless legs syndrome) 11/01/2014   Skin cancer    Sleep apnea    cpap   SOB (shortness of breath) on exertion    Spinal stenosis    Vitamin B 12 deficiency    Vitamin D  deficiency     Past Surgical History:  Procedure Laterality Date   CARDIAC CATHETERIZATION N/A 08/23/2015   Procedure: Left Heart Cath and Coronary Angiography;  Surgeon: Ozell Fell, MD;  Location: [facility];  Service: Cardiovascular;  Laterality: N/A;   CATARACT EXTRACTION Bilateral    CESAREAN SECTION     X 3   GASTRECTOMY  12/2017   INGUINAL HERNIA REPAIR Right 73 yrs old   LUMBAR LAMINECTOMY/DECOMPRESSION MICRODISCECTOMY Bilateral 10/21/2021   Procedure: Laminectomy and Foraminotomy - bilateral - Lumbar Three-Lumbar Four - Lumbar Four- Lumbar Five;  Surgeon: Louis Shove, MD;  Location: [facility];  Service: Neurosurgery;  Laterality: Bilateral;   THYROIDECTOMY     total for benign tumor, Parathyroid spared   TONSILLECTOMY     TOTAL HIP ARTHROPLASTY Right 2006   secondary to congenital  hip defect   TOTAL KNEE ARTHROPLASTY Left 01/22/2023   Procedure: LEFT TOTAL KNEE ARTHROPLASTY;  Surgeon: Melodi Lerner, MD;  Location: [facility];  Service:  Orthopedics;  Laterality: Left;   TOTAL KNEE ARTHROPLASTY Right 06/18/2023   Procedure: TOTAL KNEE ARTHROPLASTY;  Surgeon: Melodi Lerner, MD;  Location: [facility];  Service: Orthopedics;  Laterality: Right;    Family History  Problem Relation Age of Onset   Cancer Mother    Heart failure Mother    Lung cancer Mother 84       smoker   Heart Problems Mother        tachycardia   Hearing loss Mother    Anxiety disorder Mother        anxiety, claustrophobia   Obesity Mother    Osteoarthritis Father    Hyperlipidemia Father    Anuerysm Father        AAA   Sudden death Father    Hyperlipidemia Sister    Migraines Sister    Hypertension Sister    Heart Problems Sister        tachycardia   Colon cancer Maternal Grandmother    Heart attack Maternal Grandfather    Aneurysm Paternal Grandfather        abdominal   Heart disease Paternal Grandfather        AAA rupture, smoker    Social History   Socioeconomic History   Marital status: Married    Spouse name: Not on file   Number of children: 3   Years of education: Not on file   Highest education level: Not on file  Occupational History   Occupation: tree surgeon   Occupation: careers information officer   Occupation: Retired Education Administrator  Tobacco Use   Smoking status: Former    Current packs/day: 0.00    Average packs/day: 0.5 packs/day for 5.0 years (2.5 ttl pk-yrs)    Types: Cigarettes    Start date: 07/24/1966    Quit date: 07/25/1971    Years since quitting: 52.9    Passive exposure: Past   Smokeless tobacco: Never   Tobacco comments:    Former smoker 06/06/23  Vaping Use   Vaping status: Never Used  Substance and Sexual Activity   Alcohol use: Yes    Alcohol/week: 1.0 standard drink of alcohol    Types: 1 Standard drinks or equivalent per week    Comment: vodka/gin something clear 06/06/23   Drug use: No   Sexual activity: Never    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  Other Topics Concern   Not on file  Social  History Narrative   Not on file   Social Drivers of Health   Financial Resource Strain: Low Risk  (02/13/2024)   Overall Financial Resource Strain (CARDIA)    Difficulty of Paying Living Expenses: Not very hard  Food Insecurity: No Food  Insecurity (02/13/2024)   Hunger Vital Sign    Worried About Running Out of Food in the Last Year: Never true    Ran Out of Food in the Last Year: Never true  Transportation Needs: No Transportation Needs (02/13/2024)   PRAPARE - Administrator, Civil Service (Medical): No    Lack of Transportation (Non-Medical): No  Physical Activity: Inactive (02/13/2024)   Exercise Vital Sign    Days of Exercise per Week: 0 days    Minutes of Exercise per Session: 0 min  Stress: No Stress Concern Present (02/13/2024)   [employer] of Occupational Health - Occupational Stress Questionnaire    Feeling of Stress: Only a little  Social Connections: Socially Integrated (02/13/2024)   Social Connection and Isolation Panel    Frequency of Communication with Friends and Family: Twice a week    Frequency of Social Gatherings with Friends and Family: Once a week    Attends Religious Services: More than 4 times per year    Active Member of [employer] or Organizations: Yes    Attends Engineer, Structural: More than 4 times per year    Marital Status: Married  Catering Manager Violence: Not At Risk (04/15/2024)   Received from [facility]   HITS    Over the last 12 months how often did your partner scream or curse at you?: Never    Over the last 12 months how often did your partner physically hurt you?: Never    Over the last 12 months how often did your partner insult you or talk down to you?: Never    Over the last 12 months how often did your partner threaten you with physical harm?: Never    Outpatient Medications Prior to Visit  Medication Sig Dispense Refill   acetaminophen  (TYLENOL ) 650 MG CR tablet Take 1,300 mg by mouth 2 (two) times daily.      albuterol  (VENTOLIN  HFA) 108 (90 Base) MCG/ACT inhaler Inhale 1-2 puffs into the lungs every 6 (six) hours as needed for wheezing or shortness of breath. 1 each 0   apixaban  (ELIQUIS ) 5 MG TABS tablet Take 1 tablet (5 mg total) by mouth 2 (two) times daily. 180 tablet 1   benzonatate  (TESSALON ) 200 MG capsule Take 1 capsule (200 mg total) by mouth 3 (three) times daily as needed for cough. 30 capsule 0   cetirizine  (ZYRTEC ) 10 MG tablet Take 1 tablet (10 mg total) by mouth daily. 90 tablet 1   clobetasol  ointment (TEMOVATE ) 0.05 % Apply to the affected area(s) topically 2 (two) times daily as needed. 30 g 1   Diclofenac  Sodium 3 % GEL Apply 1 Application topically 3 (three) times daily as needed. 100 g 1   famotidine  (PEPCID ) 20 MG tablet TAKE 1 TABLET(20 MG) BY MOUTH AT BEDTIME 90 tablet 3   furosemide  (LASIX ) 40 MG tablet Take 1 tablet (40 mg total) by mouth daily. 90 tablet 3   levothyroxine  (SYNTHROID ) 100 MCG tablet Take 1 tablet (100 mcg total) by mouth daily before breakfast. 90 tablet 1   methocarbamol  (ROBAXIN ) 500 MG tablet Take 1 tablet (500 mg total) by mouth 2 (two) times daily. 20 tablet 0   metoprolol  succinate (TOPROL -XL) 50 MG 24 hr tablet Take 1 tablet (50 mg total) by mouth 2 (two) times daily. Take with or immediately following a meal. 180 tablet 3   nystatin  cream (MYCOSTATIN ) Apply 1 Application topically 2 (two) times daily. 60 g 0   ondansetron  (  ZOFRAN ) 4 MG tablet Take 1 tablet (4 mg total) by mouth every 6 (six) hours as needed for nausea. 20 tablet 0   pantoprazole  (PROTONIX ) 40 MG tablet Take 1 tablet (40 mg total) by mouth daily. 90 tablet 3   rOPINIRole  (REQUIP ) 2 MG tablet Take 1 tablet (2 mg total) by mouth daily. 30 tablet 5   sodium chloride  (OCEAN) 0.65 % nasal spray Place 1 spray into the nose daily as needed for congestion 44 mL 0   tiZANidine  (ZANAFLEX ) 2 MG tablet Take 0.5-2 tablets (1-4 mg total) by mouth every 8 (eight) hours as needed for muscle spasms. 30  tablet 1   traMADol  (ULTRAM ) 50 MG tablet Take 1-2 tablets (50-100 mg total) by mouth every 8 (eight) hours as needed for up to 11 doses. Max daily amount: 300mg . 90 tablet 0   Vitamin D3 (VITAMIN D ) 25 MCG tablet Take 1 tablet (1,000 Units total) by mouth daily. 60 tablet 1   fluconazole  (DIFLUCAN ) 150 MG tablet Take 1 tablet (150 mg total) by mouth daily. 1 tablet 0   fluticasone  (FLONASE ) 50 MCG/ACT nasal spray Place 2 sprays into both nostrils daily. 48 g 1   No facility-administered medications prior to visit.    Allergies  Allergen Reactions   Bee Venom Anaphylaxis   Accupril [Quinapril Hcl] Cough   Quinapril-Hydrochlorothiazide  Cough   Simvastatin  Other (See Comments)    myalgias   Rosuvastatin  Other (See Comments)    Leg aches.    ROS    See HPI Objective:    Physical Exam Constitutional:      General: She is not in acute distress.    Appearance: She is obese. She is not ill-appearing, toxic-appearing or diaphoretic.  HENT:     Head: Normocephalic and atraumatic.     Right Ear: Tympanic membrane, ear canal and external ear normal.     Left Ear: Tympanic membrane, ear canal and external ear normal.     Nose: Nose normal. No congestion.     Mouth/Throat:     Mouth: Mucous membranes are moist.     Pharynx: Oropharynx is clear.  Eyes:     Extraocular Movements: Extraocular movements intact.     Right eye: Normal extraocular motion.     Left eye: Normal extraocular motion.     Conjunctiva/sclera: Conjunctivae normal.     Pupils: Pupils are equal, round, and reactive to light.  Neck:     Thyroid : No thyroid  mass or thyromegaly.     Vascular: No carotid bruit or JVD.  Cardiovascular:     Rate and Rhythm: Normal rate and regular rhythm.     Pulses: Normal pulses.          Radial pulses are 2+ on the right side and 2+ on the left side.       Dorsalis pedis pulses are 2+ on the right side and 2+ on the left side.     Heart sounds: Normal heart sounds, S1 normal and S2  normal. No murmur heard.    No friction rub. No gallop.  Pulmonary:     Effort: Pulmonary effort is normal. No respiratory distress.     Breath sounds: Normal breath sounds.  Abdominal:     General: Bowel sounds are normal. There is no distension.     Palpations: Abdomen is soft.     Tenderness: There is no abdominal tenderness. There is no guarding.  Musculoskeletal:        General: Normal range of  motion.     Cervical back: Full passive range of motion without pain and normal range of motion. No edema or erythema.     Right lower leg: No edema.     Left lower leg: No edema.  Lymphadenopathy:     Cervical: No cervical adenopathy.  Skin:    General: Skin is warm and dry.     Capillary Refill: Capillary refill takes less than 2 seconds.  Neurological:     General: No focal deficit present.     Mental Status: She is alert and oriented to person, place, and time.     Cranial Nerves: No cranial nerve deficit.     Motor: No weakness.     Coordination: Coordination normal.     Gait: Gait normal.     Deep Tendon Reflexes: Reflexes normal.  Psychiatric:        Mood and Affect: Mood normal.        Behavior: Behavior normal.        Thought Content: Thought content normal.      BP 124/79   Pulse 63   Ht 5' 1.5 (1.562 m)   Wt 202 lb 3.2 oz (91.7 kg)   SpO2 99%   BMI 37.59 kg/m  Wt Readings from Last 3 Encounters:  07/01/24 202 lb 3.2 oz (91.7 kg)  06/24/24 196 lb (88.9 kg)  05/16/24 199 lb (90.3 kg)       Assessment & Plan:   Problem List Items Addressed This Visit     Allergic rhinitis   Refill flonase       Essential hypertension - Primary   Well controlled, no changes to meds. Encouraged heart healthy diet such as the DASH diet and exercise as tolerated.         GERD (gastroesophageal reflux disease)   Stable on current medications.      History of anemia   Asymptomatic. Increase leafy greens, consider increased lean red meat and using cast iron cookware.  Continue to monitor, report any concerns       Hyperlipidemia   Stain intolerant. Hx myalgias on Crestor  and Simvastatin . Recent coronary CT score CAC score- 0. Encourage heart healthy diet such as MIND or DASH diet, increase exercise, avoid trans fats, simple carbohydrates and processed foods, consider a krill or fish or flaxseed oil cap daily.   Update lipid panel today.       Relevant Orders   Lipid panel   Hypothyroidism   On Levothyroxine . Monitor TSH.  Lab Results  Component Value Date   TSH 2.47 04/17/2024         OAB (overactive bladder)   Overactive bladder with urinary incontinence - Referred to urogynecologist for evaluation and management. - Discussed pelvic floor exercises and complete bladder emptying techniques.      Relevant Orders   Ambulatory referral to Urogynecology   Obesity (BMI 30-39.9)   Encouraged DASH or MIND diet, decrease po intake and increase exercise as tolerated. Needs 7-8 hours of sleep nightly. Avoid trans fats, eat small, frequent meals every 4-5 hours with lean proteins, complex carbs and healthy fats. Minimize simple carbs, high fat foods and processed foods       OSA (obstructive sleep apnea)   Stable on CPAP. Follows with Pulmonology.      Osteopenia   Encouraged to get adequate exercise, calcium  and vitamin d  intake. Last DEXA 08/2022, Repeat in 2 years. Check Vitamin D  today      Paroxysmal atrial fibrillation (HCC)  Rate controlled. On eliques. Follows with Cardiology.      RLS (restless legs syndrome)   Managed with Requip . Stable.      Vitamin D  deficiency   Supplement and Monitor      Relevant Orders   Vitamin D  (25 hydroxy)   Follow up 6 months   I have discontinued Pang C. Risko's fluconazole . I am also having her maintain her vitamin D3, benzonatate , acetaminophen , ondansetron , pantoprazole , cetirizine , furosemide , albuterol , levothyroxine , Diclofenac  Sodium, rOPINIRole , apixaban , famotidine , clobetasol   ointment, sodium chloride , tiZANidine , metoprolol  succinate, methocarbamol , traMADol , nystatin  cream, and fluticasone .  Meds ordered this encounter  Medications   fluticasone  (FLONASE ) 50 MCG/ACT nasal spray    Sig: Place 2 sprays into both nostrils daily.    Dispense:  48 g    Refill:  1

## 2024-06-28 ENCOUNTER — Other Ambulatory Visit (HOSPITAL_BASED_OUTPATIENT_CLINIC_OR_DEPARTMENT_OTHER): Payer: Self-pay

## 2024-06-30 ENCOUNTER — Other Ambulatory Visit (HOSPITAL_BASED_OUTPATIENT_CLINIC_OR_DEPARTMENT_OTHER): Payer: Self-pay

## 2024-07-01 ENCOUNTER — Other Ambulatory Visit (HOSPITAL_BASED_OUTPATIENT_CLINIC_OR_DEPARTMENT_OTHER): Payer: Self-pay

## 2024-07-01 ENCOUNTER — Encounter: Payer: Self-pay | Admitting: Student

## 2024-07-01 ENCOUNTER — Ambulatory Visit: Admitting: Student

## 2024-07-01 VITALS — BP 124/79 | HR 63 | Ht 61.5 in | Wt 202.2 lb

## 2024-07-01 DIAGNOSIS — G2581 Restless legs syndrome: Secondary | ICD-10-CM

## 2024-07-01 DIAGNOSIS — E039 Hypothyroidism, unspecified: Secondary | ICD-10-CM | POA: Diagnosis not present

## 2024-07-01 DIAGNOSIS — I10 Essential (primary) hypertension: Secondary | ICD-10-CM

## 2024-07-01 DIAGNOSIS — E559 Vitamin D deficiency, unspecified: Secondary | ICD-10-CM

## 2024-07-01 DIAGNOSIS — M8580 Other specified disorders of bone density and structure, unspecified site: Secondary | ICD-10-CM

## 2024-07-01 DIAGNOSIS — J309 Allergic rhinitis, unspecified: Secondary | ICD-10-CM

## 2024-07-01 DIAGNOSIS — Z862 Personal history of diseases of the blood and blood-forming organs and certain disorders involving the immune mechanism: Secondary | ICD-10-CM | POA: Diagnosis not present

## 2024-07-01 DIAGNOSIS — K219 Gastro-esophageal reflux disease without esophagitis: Secondary | ICD-10-CM

## 2024-07-01 DIAGNOSIS — E782 Mixed hyperlipidemia: Secondary | ICD-10-CM

## 2024-07-01 DIAGNOSIS — G4733 Obstructive sleep apnea (adult) (pediatric): Secondary | ICD-10-CM

## 2024-07-01 DIAGNOSIS — N3281 Overactive bladder: Secondary | ICD-10-CM | POA: Insufficient documentation

## 2024-07-01 DIAGNOSIS — I480 Paroxysmal atrial fibrillation: Secondary | ICD-10-CM

## 2024-07-01 DIAGNOSIS — E669 Obesity, unspecified: Secondary | ICD-10-CM

## 2024-07-01 MED ORDER — FLUTICASONE PROPIONATE 50 MCG/ACT NA SUSP
2.0000 | Freq: Every day | NASAL | 1 refills | Status: AC
  Filled 2024-07-01: qty 48, 90d supply, fill #0
  Filled 2024-07-04 (×2): qty 16, 30d supply, fill #0
  Filled 2024-07-04 – 2024-08-07 (×2): qty 48, 90d supply, fill #0

## 2024-07-01 NOTE — Assessment & Plan Note (Signed)
 Supplement and Monitor

## 2024-07-01 NOTE — Assessment & Plan Note (Addendum)
 Stain intolerant. Hx myalgias on Crestor  and Simvastatin . Recent coronary CT score CAC score- 0. Encourage heart healthy diet such as MIND or DASH diet, increase exercise, avoid trans fats, simple carbohydrates and processed foods, consider a krill or fish or flaxseed oil cap daily.   Update lipid panel today.

## 2024-07-01 NOTE — Assessment & Plan Note (Signed)
 Stable on current medications

## 2024-07-01 NOTE — Assessment & Plan Note (Signed)
 Encouraged DASH or MIND diet, decrease po intake and increase exercise as tolerated. Needs 7-8 hours of sleep nightly. Avoid trans fats, eat small, frequent meals every 4-5 hours with lean proteins, complex carbs and healthy fats. Minimize simple carbs, high fat foods and processed foods

## 2024-07-01 NOTE — Assessment & Plan Note (Signed)
 Managed with Requip . Stable.

## 2024-07-01 NOTE — Assessment & Plan Note (Signed)
 Rate controlled. On eliques. Follows with Cardiology.

## 2024-07-01 NOTE — Assessment & Plan Note (Addendum)
 Encouraged to get adequate exercise, calcium  and vitamin d  intake. Last DEXA 08/2022, Repeat in 2 years. Check Vitamin D  today

## 2024-07-01 NOTE — Assessment & Plan Note (Signed)
 Stable on CPAP. Follows with Pulmonology.

## 2024-07-01 NOTE — Assessment & Plan Note (Signed)
 Overactive bladder with urinary incontinence - Referred to urogynecologist for evaluation and management. - Discussed pelvic floor exercises and complete bladder emptying techniques.

## 2024-07-01 NOTE — Assessment & Plan Note (Signed)
 On Levothyroxine . Monitor TSH.  Lab Results  Component Value Date   TSH 2.47 04/17/2024

## 2024-07-01 NOTE — Assessment & Plan Note (Signed)
 Refill flonase

## 2024-07-02 ENCOUNTER — Ambulatory Visit: Payer: Self-pay | Admitting: Student

## 2024-07-02 DIAGNOSIS — M25511 Pain in right shoulder: Secondary | ICD-10-CM | POA: Diagnosis not present

## 2024-07-02 LAB — LIPID PANEL
Cholesterol: 239 mg/dL — ABNORMAL HIGH (ref 0–200)
HDL: 84.8 mg/dL (ref >39.00)
LDL Cholesterol: 141 mg/dL — ABNORMAL HIGH (ref 0–99)
NonHDL: 153.97
Total CHOL/HDL Ratio: 3
Triglycerides: 67 mg/dL (ref 0.0–149.0)
VLDL: 13.4 mg/dL (ref 0.0–40.0)

## 2024-07-02 LAB — VITAMIN D 25 HYDROXY (VIT D DEFICIENCY, FRACTURES): VITD: 51.14 ng/mL (ref 30.00–100.00)

## 2024-07-03 ENCOUNTER — Other Ambulatory Visit: Payer: Self-pay | Admitting: Family Medicine

## 2024-07-03 ENCOUNTER — Other Ambulatory Visit (HOSPITAL_BASED_OUTPATIENT_CLINIC_OR_DEPARTMENT_OTHER): Payer: Self-pay

## 2024-07-03 DIAGNOSIS — J342 Deviated nasal septum: Secondary | ICD-10-CM | POA: Diagnosis not present

## 2024-07-03 DIAGNOSIS — J343 Hypertrophy of nasal turbinates: Secondary | ICD-10-CM | POA: Diagnosis not present

## 2024-07-03 DIAGNOSIS — J3489 Other specified disorders of nose and nasal sinuses: Secondary | ICD-10-CM | POA: Diagnosis not present

## 2024-07-03 HISTORY — PX: NASAL SEPTOPLASTY W/ TURBINOPLASTY: SHX2070

## 2024-07-04 ENCOUNTER — Encounter (INDEPENDENT_AMBULATORY_CARE_PROVIDER_SITE_OTHER): Payer: Self-pay | Admitting: Otolaryngology

## 2024-07-04 ENCOUNTER — Other Ambulatory Visit (HOSPITAL_BASED_OUTPATIENT_CLINIC_OR_DEPARTMENT_OTHER): Payer: Self-pay

## 2024-07-04 ENCOUNTER — Encounter: Payer: Self-pay | Admitting: Family

## 2024-07-04 MED ORDER — DICLOFENAC SODIUM 3 % EX GEL
1.0000 | Freq: Two times a day (BID) | CUTANEOUS | 1 refills | Status: AC | PRN
  Filled 2024-07-04 – 2024-07-15 (×2): qty 100, 30d supply, fill #0

## 2024-07-07 ENCOUNTER — Ambulatory Visit (INDEPENDENT_AMBULATORY_CARE_PROVIDER_SITE_OTHER): Admitting: Otolaryngology

## 2024-07-07 ENCOUNTER — Encounter (INDEPENDENT_AMBULATORY_CARE_PROVIDER_SITE_OTHER): Payer: Self-pay | Admitting: Otolaryngology

## 2024-07-07 VITALS — BP 127/84 | HR 66 | Ht 61.0 in | Wt 199.0 lb

## 2024-07-07 DIAGNOSIS — J342 Deviated nasal septum: Secondary | ICD-10-CM

## 2024-07-07 DIAGNOSIS — J343 Hypertrophy of nasal turbinates: Secondary | ICD-10-CM

## 2024-07-07 DIAGNOSIS — Z09 Encounter for follow-up examination after completed treatment for conditions other than malignant neoplasm: Secondary | ICD-10-CM

## 2024-07-07 DIAGNOSIS — J310 Chronic rhinitis: Secondary | ICD-10-CM

## 2024-07-07 NOTE — Progress Notes (Signed)
 Doyle splints removed. Septum and turbinates are healing well.   Both [place] debrided.  Nasal saline irrigation.  Recheck in 3 weeks.

## 2024-07-15 ENCOUNTER — Other Ambulatory Visit (HOSPITAL_BASED_OUTPATIENT_CLINIC_OR_DEPARTMENT_OTHER): Payer: Self-pay

## 2024-07-15 ENCOUNTER — Encounter: Payer: Self-pay | Admitting: Family

## 2024-07-15 ENCOUNTER — Other Ambulatory Visit: Payer: Self-pay | Admitting: Family

## 2024-07-15 ENCOUNTER — Other Ambulatory Visit: Payer: Self-pay | Admitting: Family Medicine

## 2024-07-15 ENCOUNTER — Other Ambulatory Visit: Payer: Self-pay

## 2024-07-15 MED ORDER — TRAMADOL HCL 50 MG PO TABS
50.0000 mg | ORAL_TABLET | Freq: Three times a day (TID) | ORAL | 0 refills | Status: DC | PRN
  Filled 2024-07-15: qty 90, 15d supply, fill #0

## 2024-07-15 MED ORDER — PANTOPRAZOLE SODIUM 40 MG PO TBEC
40.0000 mg | DELAYED_RELEASE_TABLET | Freq: Every day | ORAL | 1 refills | Status: AC
  Filled 2024-07-15: qty 90, 90d supply, fill #0
  Filled 2024-08-07: qty 90, 90d supply, fill #1

## 2024-07-15 MED ORDER — LEVOTHYROXINE SODIUM 100 MCG PO TABS
100.0000 ug | ORAL_TABLET | Freq: Every day | ORAL | 1 refills | Status: AC
  Filled 2024-07-15: qty 90, 90d supply, fill #0
  Filled 2024-08-07: qty 90, 90d supply, fill #1

## 2024-07-15 NOTE — Telephone Encounter (Signed)
 Requesting: tramadol  50mg   Contract:10/31/22 UDS: 10/31/22 Last Visit: 07/01/24 w/ Wheeler Next Visit: 01/01/25 Last Refill: 06/09/24 #90 and 0RF   Please Advise

## 2024-07-29 ENCOUNTER — Ambulatory Visit (INDEPENDENT_AMBULATORY_CARE_PROVIDER_SITE_OTHER): Admitting: Otolaryngology

## 2024-07-29 ENCOUNTER — Encounter (INDEPENDENT_AMBULATORY_CARE_PROVIDER_SITE_OTHER): Payer: Self-pay | Admitting: Otolaryngology

## 2024-07-29 ENCOUNTER — Encounter (INDEPENDENT_AMBULATORY_CARE_PROVIDER_SITE_OTHER): Admitting: Otolaryngology

## 2024-07-29 VITALS — BP 122/78 | HR 68 | Ht 62.0 in | Wt 200.0 lb

## 2024-07-29 DIAGNOSIS — J310 Chronic rhinitis: Secondary | ICD-10-CM

## 2024-07-29 DIAGNOSIS — Z09 Encounter for follow-up examination after completed treatment for conditions other than malignant neoplasm: Secondary | ICD-10-CM

## 2024-07-29 DIAGNOSIS — J342 Deviated nasal septum: Secondary | ICD-10-CM

## 2024-07-29 NOTE — Progress Notes (Signed)
 Septum and turbinates are healing well.   Both [place] debrided.   Nasal saline irrigation as needed.   Recheck in 6 months.

## 2024-08-07 ENCOUNTER — Other Ambulatory Visit (HOSPITAL_BASED_OUTPATIENT_CLINIC_OR_DEPARTMENT_OTHER): Payer: Self-pay

## 2024-08-07 ENCOUNTER — Other Ambulatory Visit: Payer: Self-pay | Admitting: Family Medicine

## 2024-08-07 ENCOUNTER — Other Ambulatory Visit: Payer: Self-pay | Admitting: Family

## 2024-08-07 ENCOUNTER — Other Ambulatory Visit: Payer: Self-pay

## 2024-08-07 DIAGNOSIS — G2581 Restless legs syndrome: Secondary | ICD-10-CM

## 2024-08-07 MED ORDER — TRAMADOL HCL 50 MG PO TABS
50.0000 mg | ORAL_TABLET | Freq: Three times a day (TID) | ORAL | 0 refills | Status: AC | PRN
  Filled 2024-08-07: qty 90, 15d supply, fill #0

## 2024-08-07 MED ORDER — ROPINIROLE HCL 2 MG PO TABS
2.0000 mg | ORAL_TABLET | Freq: Every day | ORAL | 5 refills | Status: AC
  Filled 2024-08-07: qty 30, 30d supply, fill #0

## 2024-08-07 NOTE — Telephone Encounter (Signed)
 Requesting: tramadol  50mg   Contract:10/31/22 UDS: 10/31/22 Last Visit: 07/01/24 Next Visit: 01/01/25 Last Refill: 07/15/24 #90 and 0RF   Please Advise

## 2024-08-08 ENCOUNTER — Other Ambulatory Visit: Payer: Self-pay

## 2024-08-08 ENCOUNTER — Other Ambulatory Visit (HOSPITAL_BASED_OUTPATIENT_CLINIC_OR_DEPARTMENT_OTHER): Payer: Self-pay

## 2024-08-11 ENCOUNTER — Telehealth: Payer: Self-pay

## 2024-08-11 NOTE — Telephone Encounter (Signed)
 PA initiated via Covermymeds; KEY: [id number]. Awaiting determination.

## 2024-08-13 NOTE — Telephone Encounter (Signed)
 Diclofenac  3% is only FDA approved for actinic keratosis

## 2024-08-13 NOTE — Telephone Encounter (Signed)
 PA denied.   Denied. We denied coverage for this drug because Diclofenac  3% Gel is not being prescribed for an FDA labeled or medically accepted use. A medically accepted use is approved by the FDA or supported by the [facility] Formulary Service Drug Information and the DRUGDEX Information System. In this case, Diclofenac  3% Gel is not being prescribed in accordance with an FDA labeled use or use accepted by the Medicare approved drug compendia.

## 2024-08-15 ENCOUNTER — Encounter: Payer: Self-pay | Admitting: Family Medicine

## 2024-08-15 ENCOUNTER — Encounter: Payer: Self-pay | Admitting: Family

## 2024-08-15 ENCOUNTER — Other Ambulatory Visit (HOSPITAL_BASED_OUTPATIENT_CLINIC_OR_DEPARTMENT_OTHER): Payer: Self-pay

## 2024-08-15 DIAGNOSIS — K219 Gastro-esophageal reflux disease without esophagitis: Secondary | ICD-10-CM

## 2024-08-15 MED ORDER — FAMOTIDINE 20 MG PO TABS
20.0000 mg | ORAL_TABLET | Freq: Every day | ORAL | 3 refills | Status: AC
  Filled 2024-08-15: qty 90, 90d supply, fill #0

## 2024-08-15 MED ORDER — DICLOFENAC SODIUM 1 % EX GEL
2.0000 g | Freq: Four times a day (QID) | CUTANEOUS | 1 refills | Status: AC
  Filled 2024-08-15: qty 200, 30d supply, fill #0

## 2024-08-15 NOTE — Addendum Note (Signed)
 Addended by: Selah Zelman D on: 08/15/2024 07:46 AM   Modules accepted: Orders

## 2024-08-20 ENCOUNTER — Other Ambulatory Visit: Payer: Self-pay | Admitting: Cardiovascular Disease

## 2024-08-20 ENCOUNTER — Encounter: Payer: Self-pay | Admitting: Cardiovascular Disease

## 2024-08-20 ENCOUNTER — Other Ambulatory Visit: Payer: Self-pay

## 2024-08-20 ENCOUNTER — Other Ambulatory Visit (HOSPITAL_BASED_OUTPATIENT_CLINIC_OR_DEPARTMENT_OTHER): Payer: Self-pay

## 2024-08-20 DIAGNOSIS — I480 Paroxysmal atrial fibrillation: Secondary | ICD-10-CM

## 2024-08-20 MED ORDER — APIXABAN 5 MG PO TABS
5.0000 mg | ORAL_TABLET | Freq: Two times a day (BID) | ORAL | 1 refills | Status: AC
  Filled 2024-08-20: qty 180, 90d supply, fill #0

## 2024-08-24 ENCOUNTER — Other Ambulatory Visit (HOSPITAL_BASED_OUTPATIENT_CLINIC_OR_DEPARTMENT_OTHER): Payer: Self-pay

## 2024-08-27 ENCOUNTER — Encounter: Payer: Self-pay | Admitting: Student

## 2024-08-27 ENCOUNTER — Encounter: Payer: Self-pay | Admitting: Family Medicine

## 2024-08-28 NOTE — Telephone Encounter (Signed)
 Appt previously scheduled on 12/16/2024.

## 2024-08-28 NOTE — Telephone Encounter (Signed)
[facility] to accept patient

## 2024-10-09 ENCOUNTER — Ambulatory Visit: Admitting: Rheumatology

## 2024-12-16 ENCOUNTER — Encounter: Admitting: Family Medicine

## 2025-01-01 ENCOUNTER — Ambulatory Visit: Admitting: Family Medicine

## 2025-01-29 ENCOUNTER — Ambulatory Visit (INDEPENDENT_AMBULATORY_CARE_PROVIDER_SITE_OTHER): Admitting: Otolaryngology

## 2025-02-18 ENCOUNTER — Ambulatory Visit
